# Patient Record
Sex: Male | Born: 1935 | Race: White | Hispanic: No | Marital: Married | Smoking: Former smoker
Health system: Southern US, Community
[De-identification: ages and names within clinical notes are randomized; demographics above are authoritative.]

## PROBLEM LIST (undated history)

## (undated) DIAGNOSIS — I10 Essential (primary) hypertension: Secondary | ICD-10-CM

## (undated) DIAGNOSIS — C4490 Unspecified malignant neoplasm of skin, unspecified: Secondary | ICD-10-CM

## (undated) DIAGNOSIS — A419 Sepsis, unspecified organism: Secondary | ICD-10-CM

## (undated) DIAGNOSIS — Z87898 Personal history of other specified conditions: Secondary | ICD-10-CM

## (undated) DIAGNOSIS — B029 Zoster without complications: Secondary | ICD-10-CM

## (undated) DIAGNOSIS — I519 Heart disease, unspecified: Secondary | ICD-10-CM

## (undated) DIAGNOSIS — K222 Esophageal obstruction: Secondary | ICD-10-CM

## (undated) DIAGNOSIS — R7989 Other specified abnormal findings of blood chemistry: Secondary | ICD-10-CM

## (undated) DIAGNOSIS — M810 Age-related osteoporosis without current pathological fracture: Secondary | ICD-10-CM

## (undated) DIAGNOSIS — C61 Malignant neoplasm of prostate: Secondary | ICD-10-CM

## (undated) DIAGNOSIS — Z8669 Personal history of other diseases of the nervous system and sense organs: Secondary | ICD-10-CM

## (undated) DIAGNOSIS — E7800 Pure hypercholesterolemia, unspecified: Secondary | ICD-10-CM

## (undated) DIAGNOSIS — I509 Heart failure, unspecified: Secondary | ICD-10-CM

## (undated) DIAGNOSIS — K8050 Calculus of bile duct without cholangitis or cholecystitis without obstruction: Secondary | ICD-10-CM

## (undated) DIAGNOSIS — Z8719 Personal history of other diseases of the digestive system: Secondary | ICD-10-CM

## (undated) DIAGNOSIS — E46 Unspecified protein-calorie malnutrition: Secondary | ICD-10-CM

## (undated) DIAGNOSIS — N184 Chronic kidney disease, stage 4 (severe): Secondary | ICD-10-CM

## (undated) DIAGNOSIS — D649 Anemia, unspecified: Secondary | ICD-10-CM

## (undated) DIAGNOSIS — N19 Unspecified kidney failure: Secondary | ICD-10-CM

## (undated) DIAGNOSIS — E039 Hypothyroidism, unspecified: Secondary | ICD-10-CM

## (undated) HISTORY — DX: Personal history of other specified conditions: Z87.898

## (undated) HISTORY — DX: Calculus of bile duct without cholangitis or cholecystitis without obstruction: K80.50

## (undated) HISTORY — DX: Heart disease, unspecified: I51.9

## (undated) HISTORY — DX: Unspecified kidney failure: N19

## (undated) HISTORY — DX: Unspecified malignant neoplasm of skin, unspecified: C44.90

## (undated) HISTORY — DX: Hypothyroidism, unspecified: E03.9

## (undated) HISTORY — DX: Zoster without complications: B02.9

## (undated) HISTORY — DX: Other specified abnormal findings of blood chemistry: R79.89

## (undated) HISTORY — DX: Anemia, unspecified: D64.9

## (undated) HISTORY — DX: Esophageal obstruction: K22.2

## (undated) HISTORY — DX: Sepsis, unspecified organism: A41.9

## (undated) HISTORY — DX: Chronic kidney disease, stage 4 (severe): N18.4

## (undated) HISTORY — DX: Pure hypercholesterolemia, unspecified: E78.00

## (undated) HISTORY — DX: Malignant neoplasm of prostate: C61

## (undated) HISTORY — DX: Personal history of other diseases of the nervous system and sense organs: Z86.69

## (undated) HISTORY — DX: Personal history of other diseases of the digestive system: Z87.19

## (undated) HISTORY — DX: Unspecified protein-calorie malnutrition: E46

---

## 2004-04-09 ENCOUNTER — Other Ambulatory Visit: Payer: Self-pay

## 2004-04-09 ENCOUNTER — Emergency Department: Payer: Self-pay | Admitting: Emergency Medicine

## 2004-05-16 ENCOUNTER — Ambulatory Visit: Payer: Self-pay | Admitting: Internal Medicine

## 2004-07-17 ENCOUNTER — Ambulatory Visit: Payer: Self-pay | Admitting: Urology

## 2004-08-04 ENCOUNTER — Ambulatory Visit: Payer: Self-pay | Admitting: Cardiovascular Disease

## 2004-08-09 ENCOUNTER — Ambulatory Visit: Payer: Self-pay | Admitting: Cardiovascular Disease

## 2004-08-09 ENCOUNTER — Inpatient Hospital Stay (HOSPITAL_COMMUNITY): Admission: EM | Admit: 2004-08-09 | Discharge: 2004-08-10 | Payer: Self-pay | Admitting: Cardiology

## 2004-10-11 ENCOUNTER — Ambulatory Visit: Payer: Self-pay | Admitting: Radiation Oncology

## 2004-11-06 ENCOUNTER — Ambulatory Visit: Payer: Self-pay | Admitting: Radiation Oncology

## 2004-12-07 ENCOUNTER — Ambulatory Visit: Payer: Self-pay | Admitting: Radiation Oncology

## 2005-01-06 ENCOUNTER — Ambulatory Visit: Payer: Self-pay | Admitting: Radiation Oncology

## 2005-06-04 ENCOUNTER — Ambulatory Visit: Payer: Self-pay | Admitting: Radiation Oncology

## 2005-06-08 ENCOUNTER — Ambulatory Visit: Payer: Self-pay | Admitting: Radiation Oncology

## 2005-07-31 ENCOUNTER — Ambulatory Visit: Payer: Self-pay | Admitting: Urology

## 2005-07-31 ENCOUNTER — Other Ambulatory Visit: Payer: Self-pay

## 2005-08-02 ENCOUNTER — Ambulatory Visit: Payer: Self-pay | Admitting: Urology

## 2005-08-20 ENCOUNTER — Ambulatory Visit: Payer: Self-pay | Admitting: Urology

## 2005-08-23 ENCOUNTER — Ambulatory Visit: Payer: Self-pay | Admitting: Urology

## 2005-10-30 ENCOUNTER — Ambulatory Visit: Payer: Self-pay | Admitting: Urology

## 2005-10-30 ENCOUNTER — Other Ambulatory Visit: Payer: Self-pay

## 2005-11-01 ENCOUNTER — Ambulatory Visit: Payer: Self-pay | Admitting: Urology

## 2005-12-06 ENCOUNTER — Ambulatory Visit: Payer: Self-pay | Admitting: Radiation Oncology

## 2006-01-15 ENCOUNTER — Ambulatory Visit: Payer: Self-pay | Admitting: Urology

## 2006-01-15 ENCOUNTER — Other Ambulatory Visit: Payer: Self-pay

## 2006-01-31 ENCOUNTER — Ambulatory Visit: Payer: Self-pay | Admitting: Urology

## 2006-04-23 ENCOUNTER — Ambulatory Visit: Payer: Self-pay | Admitting: Urology

## 2006-04-30 ENCOUNTER — Ambulatory Visit: Payer: Self-pay | Admitting: Urology

## 2006-07-19 ENCOUNTER — Ambulatory Visit: Payer: Self-pay | Admitting: Urology

## 2006-12-05 ENCOUNTER — Ambulatory Visit: Payer: Self-pay | Admitting: Radiation Oncology

## 2007-06-26 ENCOUNTER — Ambulatory Visit: Payer: Self-pay | Admitting: Specialist

## 2007-11-07 ENCOUNTER — Ambulatory Visit: Payer: Self-pay | Admitting: Radiation Oncology

## 2007-12-04 ENCOUNTER — Ambulatory Visit: Payer: Self-pay | Admitting: Radiation Oncology

## 2007-12-08 ENCOUNTER — Ambulatory Visit: Payer: Self-pay | Admitting: Radiation Oncology

## 2008-11-06 ENCOUNTER — Ambulatory Visit: Payer: Self-pay | Admitting: Radiation Oncology

## 2008-12-02 ENCOUNTER — Ambulatory Visit: Payer: Self-pay | Admitting: Radiation Oncology

## 2008-12-07 ENCOUNTER — Ambulatory Visit: Payer: Self-pay | Admitting: Radiation Oncology

## 2009-11-06 ENCOUNTER — Ambulatory Visit: Payer: Self-pay | Admitting: Radiation Oncology

## 2009-12-01 ENCOUNTER — Ambulatory Visit: Payer: Self-pay | Admitting: Radiation Oncology

## 2009-12-07 ENCOUNTER — Ambulatory Visit: Payer: Self-pay | Admitting: Radiation Oncology

## 2010-06-05 ENCOUNTER — Ambulatory Visit: Payer: Self-pay | Admitting: Radiation Oncology

## 2010-06-06 LAB — PSA

## 2010-06-08 ENCOUNTER — Ambulatory Visit: Payer: Self-pay | Admitting: Radiation Oncology

## 2010-11-14 ENCOUNTER — Ambulatory Visit: Payer: Self-pay | Admitting: Radiation Oncology

## 2010-12-01 ENCOUNTER — Emergency Department (HOSPITAL_COMMUNITY): Payer: Medicare Other

## 2010-12-01 ENCOUNTER — Inpatient Hospital Stay (HOSPITAL_COMMUNITY)
Admission: EM | Admit: 2010-12-01 | Discharge: 2010-12-03 | DRG: 312 | Disposition: A | Discharge status: Home / Self Care | Payer: Medicare Other | Attending: Emergency Medicine | Admitting: Emergency Medicine

## 2010-12-01 DIAGNOSIS — I959 Hypotension, unspecified: Secondary | ICD-10-CM | POA: Diagnosis present

## 2010-12-01 DIAGNOSIS — N269 Renal sclerosis, unspecified: Secondary | ICD-10-CM | POA: Diagnosis present

## 2010-12-01 DIAGNOSIS — N189 Chronic kidney disease, unspecified: Secondary | ICD-10-CM | POA: Diagnosis present

## 2010-12-01 DIAGNOSIS — I2510 Atherosclerotic heart disease of native coronary artery without angina pectoris: Secondary | ICD-10-CM | POA: Diagnosis present

## 2010-12-01 DIAGNOSIS — E785 Hyperlipidemia, unspecified: Secondary | ICD-10-CM | POA: Diagnosis present

## 2010-12-01 DIAGNOSIS — E039 Hypothyroidism, unspecified: Secondary | ICD-10-CM | POA: Diagnosis present

## 2010-12-01 DIAGNOSIS — R55 Syncope and collapse: Principal | ICD-10-CM | POA: Diagnosis present

## 2010-12-01 DIAGNOSIS — Z7982 Long term (current) use of aspirin: Secondary | ICD-10-CM

## 2010-12-01 DIAGNOSIS — Z9861 Coronary angioplasty status: Secondary | ICD-10-CM

## 2010-12-01 DIAGNOSIS — I129 Hypertensive chronic kidney disease with stage 1 through stage 4 chronic kidney disease, or unspecified chronic kidney disease: Secondary | ICD-10-CM | POA: Diagnosis present

## 2010-12-01 DIAGNOSIS — C61 Malignant neoplasm of prostate: Secondary | ICD-10-CM | POA: Diagnosis present

## 2010-12-01 DIAGNOSIS — E860 Dehydration: Secondary | ICD-10-CM | POA: Diagnosis present

## 2010-12-01 DIAGNOSIS — Z7902 Long term (current) use of antithrombotics/antiplatelets: Secondary | ICD-10-CM

## 2010-12-01 LAB — PROTIME-INR
INR: 1.01 (ref 0.00–1.49)
Prothrombin Time: 13.5 seconds (ref 11.6–15.2)

## 2010-12-01 LAB — DIFFERENTIAL
Basophils Absolute: 0 10*3/uL (ref 0.0–0.1)
Basophils Relative: 0 % (ref 0–1)
Monocytes Relative: 6 % (ref 3–12)
Neutro Abs: 7.9 10*3/uL — ABNORMAL HIGH (ref 1.7–7.7)
Neutrophils Relative %: 85 % — ABNORMAL HIGH (ref 43–77)

## 2010-12-01 LAB — BASIC METABOLIC PANEL
BUN: 45 mg/dL — ABNORMAL HIGH (ref 6–23)
CO2: 22 mEq/L (ref 19–32)
Chloride: 100 mEq/L (ref 96–112)
Creatinine, Ser: 2.68 mg/dL — ABNORMAL HIGH (ref 0.4–1.5)
Potassium: 3.9 mEq/L (ref 3.5–5.1)

## 2010-12-01 LAB — CK TOTAL AND CKMB (NOT AT ARMC)
Relative Index: INVALID (ref 0.0–2.5)
Total CK: 99 U/L (ref 7–232)

## 2010-12-01 LAB — CBC
Hemoglobin: 13.8 g/dL (ref 13.0–17.0)
Platelets: 153 10*3/uL (ref 150–400)
RBC: 4.66 MIL/uL (ref 4.22–5.81)
WBC: 9.3 10*3/uL (ref 4.0–10.5)

## 2010-12-01 LAB — APTT: aPTT: 25 seconds (ref 24–37)

## 2010-12-02 ENCOUNTER — Inpatient Hospital Stay (HOSPITAL_COMMUNITY): Payer: Medicare Other

## 2010-12-02 ENCOUNTER — Other Ambulatory Visit: Payer: Self-pay | Admitting: Internal Medicine

## 2010-12-02 DIAGNOSIS — R55 Syncope and collapse: Secondary | ICD-10-CM

## 2010-12-02 LAB — COMPREHENSIVE METABOLIC PANEL
AST: 13 U/L (ref 0–37)
Albumin: 3 g/dL — ABNORMAL LOW (ref 3.5–5.2)
BUN: 43 mg/dL — ABNORMAL HIGH (ref 6–23)
Chloride: 106 mEq/L (ref 96–112)
Creatinine, Ser: 2.46 mg/dL — ABNORMAL HIGH (ref 0.4–1.5)
GFR calc Af Amer: 31 mL/min — ABNORMAL LOW (ref >60)
Total Protein: 5.7 g/dL — ABNORMAL LOW (ref 6.0–8.3)

## 2010-12-02 LAB — CBC
HCT: 35.9 % — ABNORMAL LOW (ref 39.0–52.0)
MCH: 29.9 pg (ref 26.0–34.0)
MCV: 85.9 fL (ref 78.0–100.0)
Platelets: 143 10*3/uL — ABNORMAL LOW (ref 150–400)
RDW: 13.7 % (ref 11.5–15.5)

## 2010-12-02 LAB — LIPID PANEL
Cholesterol: 122 mg/dL (ref 0–200)
HDL: 42 mg/dL (ref >39)
LDL Cholesterol: 51 mg/dL (ref 0–99)
Total CHOL/HDL Ratio: 2.9 RATIO
Triglycerides: 146 mg/dL (ref <150)
VLDL: 29 mg/dL (ref 0–40)

## 2010-12-02 LAB — CREATININE, URINE, RANDOM: Creatinine, Urine: 50.29 mg/dL

## 2010-12-02 LAB — URINALYSIS, ROUTINE W REFLEX MICROSCOPIC
Bilirubin Urine: NEGATIVE
Hgb urine dipstick: NEGATIVE
Ketones, ur: NEGATIVE mg/dL
Nitrite: NEGATIVE
Specific Gravity, Urine: 1.012 (ref 1.005–1.030)
Urobilinogen, UA: 0.2 mg/dL (ref 0.0–1.0)
pH: 5.5 (ref 5.0–8.0)

## 2010-12-02 LAB — CARDIAC PANEL(CRET KIN+CKTOT+MB+TROPI)
CK, MB: 2.4 ng/mL (ref 0.3–4.0)
CK, MB: 2.7 ng/mL (ref 0.3–4.0)
Total CK: 101 U/L (ref 7–232)

## 2010-12-02 LAB — SODIUM, URINE, RANDOM: Sodium, Ur: 35 mEq/L

## 2010-12-03 DIAGNOSIS — R55 Syncope and collapse: Secondary | ICD-10-CM

## 2010-12-03 LAB — CBC
HCT: 36 % — ABNORMAL LOW (ref 39.0–52.0)
Platelets: 149 10*3/uL — ABNORMAL LOW (ref 150–400)
RDW: 13.8 % (ref 11.5–15.5)
WBC: 5.2 10*3/uL (ref 4.0–10.5)

## 2010-12-03 LAB — DIFFERENTIAL
Basophils Absolute: 0 10*3/uL (ref 0.0–0.1)
Eosinophils Relative: 4 % (ref 0–5)
Lymphocytes Relative: 20 % (ref 12–46)
Neutrophils Relative %: 65 % (ref 43–77)

## 2010-12-03 LAB — COMPREHENSIVE METABOLIC PANEL
ALT: 16 U/L (ref 0–53)
AST: 18 U/L (ref 0–37)
Calcium: 8.3 mg/dL — ABNORMAL LOW (ref 8.4–10.5)
GFR calc Af Amer: 37 mL/min — ABNORMAL LOW (ref >60)
Sodium: 141 mEq/L (ref 135–145)
Total Protein: 5.7 g/dL — ABNORMAL LOW (ref 6.0–8.3)

## 2010-12-08 ENCOUNTER — Ambulatory Visit: Payer: Self-pay | Admitting: Radiation Oncology

## 2010-12-08 NOTE — H&P (Signed)
NAME:  [name], MANCILLA NO.:  [id number]  MEDICAL RECORD NO.:  [id number]           PATIENT TYPE:  E  LOCATION:  [facility]                         FACILITY:  [facility]  PHYSICIAN:  Rise Patience, MDDATE OF BIRTH:  08-17-1926  DATE OF ADMISSION:  12/01/2010 DATE OF DISCHARGE:                             HISTORY & PHYSICAL   PRIMARY CARE PHYSICIAN:  Dr. Brunetta Genera at [facility].  CHIEF COMPLAINT:  Fainting spell.  HISTORY OF PRESENT ILLNESS:  An 77 year old male with known history of CAD, status post stenting, hypertension, hypothyroidism, hyperlipidemia, history of CA prostate with recurrent, was doing fine around 4 o'clock in the evening, the patient suddenly got headache in the occipital area, and things around blurred and then felt dizzy.  He was in his yard with his lawnmower at that time, trying to change the oil, he walked back to his house, and his wife was in the house.  The patient sat down and he had diaphoresis.  The patient did not have any chest pain or shortness of breath.  No nausea, vomiting, abdominal pain, dysuria, discharge, diarrhea.  He did not have any focal deficit.  Did not have any difficulty swallowing or speaking.  His symptoms of headache lasted until the patient came to the ER, but his blurred vision completely resolved at home itself.  The patient was found to have increased creatinine at this time, is not orthostatic, has been admitted for further workup.  PAST HISTORY:  History of CAD status post stenting, hypertension, hyperlipidemia, hypothyroidism, history of CA prostate with recurrent.  PAST SURGICAL HISTORY:  Had skin cancer removed from his face 2 weeks ago.  MEDICATIONS PRIOR TO ADMISSION:  Has to be verified includes: 1. Benicar 40/12.5 p.o. daily. 2. Ferrous sulfate. 3. Klor-Con 10 mg daily. 4. Lasix 20 mg daily. 5. Levoxyl 50 mcg p.o. daily. 6. Lovaza 1000 mg twice daily. 7. Potassium chloride. 8. Protonix. 9.  Vitamin D3. 10.Flomax 0.4 mg daily. 11.Plavix 75 daily. 12.Caduet 10/40 daily. 13.Aspirin 81 daily. 14.Nitroglycerin 0.4 mg sublingual p.r.n. for chest pain.  ALLERGIES:  PENICILLIN.  FAMILY HISTORY:  Positive for coronary disease.  SOCIAL HISTORY:  The patient quit smoking many years ago.  Denies any alcohol or drug abuse.  He is married, lives with his wife.  He is a full code.  REVIEW OF SYSTEMS:  As per in the history of presenting illness.  In addition, the patient's occipital headache has resolved at this time. He said he did get some temporal headache after that which also has improved at this time.  PHYSICAL EXAMINATION:  GENERAL:  The patient was examined at bedside, not in acute distress. VITAL SIGNS:  Blood pressure 120/67, pulse 78 per minute, temperature 98.7, respiration 18 per minute, O2 sat 98%. HEENT:  Anicteric.  No pallor.  No facial asymmetry.  Tongue is midline. PERLA positive.  The patient is able to see in both eyes.  No neck rigidity. CHEST:  Bilateral air entry present.  No rhonchi.  No crepitation. HEART:  S1 and S2 heard. ABDOMEN:  Soft, nontender.  Bowel sounds heard. CNS:  The  patient is alert, awake, and oriented to time, place, and person.  Moves upper and lower extremities, 5/5.  There is no pronator drift.  No dysdiadochokinesia or ataxia. EXTREMITIES:  Peripheral pulses felt.  No edema.  LABS:  EKG shows normal sinus rhythm with RBBB, heart rate is around 57 beats per minute with nonspecific ST-T changes.  I have ordered a CT of the head without contrast.  CBC:  WBC 9.3, hemoglobin 13.8, hematocrit is 40.2, platelets are 53, neutrophils 85%.  PT/INR is 13.5 and 1. Basic metabolic panel:  Sodium [id number], potassium 3.9, chloride 100, carbon dioxide 22, glucose 126, anion gap is 13, BUN 45, creatinine 2.6, calcium 9, CK is 99, CK-MB is 2.7, troponin less than 0.3, BNP 67.1.  ASSESSMENT: 1. Near syncope. 2. Renal failure probably acute. 3.  Headache, which is resolved at this time. 4. History of coronary artery disease status post stenting. 5. Hypertension. 6. History of hyperlipidemia. 7. History of hypothyroidism. 8. History of cancer prostate, recurrent at this time.  PLAN: 1. At this time, we will admit the patient to telemetry to rule out     any arrhythmia. 2. For his near syncopal episode, I am going to get a CT head as the     patient had initially some of the headache.  We will also get a sed     rate and then we will get an MRI of the brain in the morning.  We     will get 2-D echo.  Cycle cardiac markers. 3. For his renal failure, we will get a UA at this time.  We will also     look for any cast and also look for any possible UTI.  We will keep     the patient on strict intake output.  I am going to hydrate the     patient.  We will hold off his Lasix.  We will also hold off his     Benicar and HCTZ at this time.  If his creatinine does not improve,     we will need a renal sonogram. 4. For his hypertension, at this time, I will keep the patient on     p.r.n. hydralazine and we will continue his Norvasc. 5. Further recommendation based on test order and clinical course.     Rise Patience, MD     ANK/MEDQ  D:  12/01/2010  T:  12/01/2010  Job:  [id number]  cc:   Lorelee Market  Electronically Signed by Gean Birchwood MD on 12/08/2010 07:41:39 AM

## 2011-02-05 NOTE — Discharge Summary (Signed)
[name], [name]                 ACCOUNT NO.:  [id number]  MEDICAL RECORD NO.:  [id number]           PATIENT TYPE:  I  LOCATION:  2005                         FACILITY:  [facility]  PHYSICIAN:  Christe Tellez I Sharilynn Cassity, MD      DATE OF BIRTH:  August 24, 1926  DATE OF ADMISSION:  12/01/2010 DATE OF DISCHARGE:  12/03/2010                              DISCHARGE SUMMARY   PRIMARY CARE PHYSICIAN:  Dr. Brunetta Genera at [facility].  CARDIOLOGIST:  Dr. Humphrey Rolls at [facility] in [place].  DISCHARGE DIAGNOSES: 1. Syncopal episode felt to be secondary to hypertension. 2. Hypertension, felt to be secondary to medications. 3. Acute-on-chronic renal insufficiency, improving, baseline and known     to me. 4. Coronary artery disease, status post stenting. 5. Hypothyroidism. 6. Hyperlipidemia. 7. History of left kidney atrophy. 8. History of prostate cancer, which is recurrent.  CURRENT DISCHARGE MEDICATIONS: 1. Aspirin 81 mg daily. 2. Caduet 10/40 half tab daily. 3. Ferrous sulfate 325 twice daily. 4. K-Dur 10 mEq p.o. daily. 5. Levothyroxine 50 mcg p.o. daily. 6. Lovaza. 7. Nitroglycerin 0.4. 8. Protonix 40 mg twice daily. 9. Plavix 75 mg daily. 10.Flomax 0.4 mg daily. 11.Vitamin D3.  MEDICATIONS ON HOLD:  Benicar 40/12.5 and Lasix 20 mg daily.  PROCEDURES: 1. A 2-D echo, EF 50-60.  Wall motion was normal.  There were no     residual wall motion abnormalities.  Cavity size mildly dilated,     wall thickness within normal.  No defect or patent foramen ovale     was identified.  Aortic valve minimally calcified.  Leaflet     mobility was not restricted.  Aortic root was normal in size. 2. Ultrasound of the kidney, left renal atrophy and hydronephrosis.     Recommend evaluation with nuclear medicine renal scan to assess for     any residual left renal function and per patient discussion this is     his normal baseline and he is being followed up by nephrologists     and  urologists. 3. MRI of the brain.  No acute infarct.  Moderate small vessel     disease. 4. MRI of the cervical spine; 8 mm sclerotic focus, right aspect of C2     at the base of the dens without other sclerotic foci to suggest     metastatic disease, C5-C6 with small broad-based disk complex with     mild spinal stenosis.  Minimal cord effacement.  Mild bilateral     foraminal narrowing. 5. CT head without contrast; no acute intracranial hemorrhage or CT     findings __________.  HISTORY OF PRESENT ILLNESS:  This is an 77 year old male with a history of coronary artery disease status post stent followed up at [facility] with Dr. Humphrey Rolls in [place], hypertension, hypothyroidism, presented with an episode of syncope lasted for a short second.  Per EMS, the patient was hypotensive, blood pressure was 84/64 with pulse rate 16, heart rate 64, oxygen saturation not reported and glucose 116. The patient was awake, alert, oriented when seen by EMS, had cool and sweaty skin.  He was complaining of pain at the rear of his head prior to feeling lightheaded.  The patient was transferred to the ED and admitted to [facility] for further management. 1. Syncopal episode.  The patient admitted to Telemetry.  Cardiac     monitors negative for any fatal arrhythmia.  Cardiac enzymes were     cycled which were negative.  The patient noted to be hypotensive by     EMS and noted the elevated renal function which felt could be the     cause of the patient's syncopal episode.  Accordingly, medication     kept on hold, mainly Benicar, Lasix, and the patient was advised to     hold Lasix for at least 3 days and check with his MD to repeat BUN     and creatinine and further adjustment of his blood pressure     medication. 2. Acute-on-chronic renal insufficiency.  The patient informed me he     had left kidney atrophy and this has matched the ultrasound of the     kidney.  His creatinine today  improved to 2.09 and BUN of 30 and     this is likely the cause of the patient's syncopal episode due to     hypotension and acute-on-chronic renal insufficiency and     dehydration. 3. Coronary artery disease.  The patient will continue his aspirin and     Plavix and he needs to follow up with Dr. Humphrey Rolls as an outpatient. 4. The MRI of the C spine did show a small sclerotic foci, not clear     if this is metastatic from the prostate and the patient admitted he     had recurrence of his prostate cancer.  I suggest to follow up with     his MD as well as with his oncologist.  I will contact the     patient's MD by Tuesday as we have [facility].     Kirk Basey Franco Collet, MD     HIE/MEDQ  D:  12/03/2010  T:  12/04/2010  Job:  [id number]  cc:   Dr. Humphrey Rolls Dr. Brunetta Genera  Electronically Signed by Donia Ast MD on 02/05/2011 02:33:52 PM

## 2011-05-17 ENCOUNTER — Ambulatory Visit: Payer: Self-pay | Admitting: Radiation Oncology

## 2011-06-09 ENCOUNTER — Ambulatory Visit: Payer: Self-pay | Admitting: Radiation Oncology

## 2011-11-15 ENCOUNTER — Ambulatory Visit: Payer: Self-pay | Admitting: Radiation Oncology

## 2011-12-08 ENCOUNTER — Ambulatory Visit: Payer: Self-pay | Admitting: Radiation Oncology

## 2012-03-05 ENCOUNTER — Inpatient Hospital Stay: Payer: Self-pay | Admitting: Internal Medicine

## 2012-03-05 LAB — COMPREHENSIVE METABOLIC PANEL
Albumin: 3.5 g/dL (ref 3.4–5.0)
Anion Gap: 8 (ref 7–16)
BUN: 23 mg/dL — ABNORMAL HIGH (ref 7–18)
Bilirubin,Total: 1 mg/dL (ref 0.2–1.0)
Creatinine: 2.13 mg/dL — ABNORMAL HIGH (ref 0.60–1.30)
Glucose: 195 mg/dL — ABNORMAL HIGH (ref 65–99)
Osmolality: 285 (ref 275–301)
Potassium: 1.9 mmol/L — CL (ref 3.5–5.1)
SGOT(AST): 33 U/L (ref 15–37)
Sodium: 138 mmol/L (ref 136–145)
Total Protein: 7 g/dL (ref 6.4–8.2)

## 2012-03-05 LAB — CBC WITH DIFFERENTIAL/PLATELET
Basophil %: 0.4 %
Eosinophil %: 1.1 %
HGB: 15.1 g/dL (ref 13.0–18.0)
Lymphocyte #: 0.9 10*3/uL — ABNORMAL LOW (ref 1.0–3.6)
MCH: 28.9 pg (ref 26.0–34.0)
MCV: 84 fL (ref 80–100)
Monocyte #: 0.6 x10 3/mm (ref 0.2–1.0)
RBC: 5.23 10*6/uL (ref 4.40–5.90)

## 2012-03-05 LAB — MAGNESIUM: Magnesium: 1.9 mg/dL

## 2012-03-06 LAB — POTASSIUM: Potassium: 2.9 mmol/L — ABNORMAL LOW (ref 3.5–5.1)

## 2012-03-06 LAB — BASIC METABOLIC PANEL
Anion Gap: 9 (ref 7–16)
Calcium, Total: 8.4 mg/dL — ABNORMAL LOW (ref 8.5–10.1)
Chloride: 101 mmol/L (ref 98–107)
Co2: 32 mmol/L (ref 21–32)
Creatinine: 1.97 mg/dL — ABNORMAL HIGH (ref 0.60–1.30)
EGFR (African American): 36 — ABNORMAL LOW
Osmolality: 288 (ref 275–301)

## 2012-03-06 LAB — MAGNESIUM: Magnesium: 1.9 mg/dL

## 2012-03-07 LAB — BASIC METABOLIC PANEL
Anion Gap: 10 (ref 7–16)
Calcium, Total: 8.2 mg/dL — ABNORMAL LOW (ref 8.5–10.1)
Creatinine: 1.81 mg/dL — ABNORMAL HIGH (ref 0.60–1.30)
EGFR (African American): 39 — ABNORMAL LOW
EGFR (Non-African Amer.): 34 — ABNORMAL LOW
Osmolality: 285 (ref 275–301)
Potassium: 2.9 mmol/L — ABNORMAL LOW (ref 3.5–5.1)

## 2012-03-07 LAB — POTASSIUM: Potassium: 3.4 mmol/L — ABNORMAL LOW

## 2012-05-22 ENCOUNTER — Ambulatory Visit: Payer: Self-pay | Admitting: Radiation Oncology

## 2012-05-25 DIAGNOSIS — N200 Calculus of kidney: Secondary | ICD-10-CM | POA: Insufficient documentation

## 2012-05-25 DIAGNOSIS — N1330 Unspecified hydronephrosis: Secondary | ICD-10-CM | POA: Insufficient documentation

## 2012-05-25 DIAGNOSIS — N201 Calculus of ureter: Secondary | ICD-10-CM | POA: Insufficient documentation

## 2012-05-25 DIAGNOSIS — N184 Chronic kidney disease, stage 4 (severe): Secondary | ICD-10-CM | POA: Insufficient documentation

## 2012-05-28 ENCOUNTER — Ambulatory Visit: Payer: Self-pay | Admitting: Urology

## 2012-06-11 ENCOUNTER — Ambulatory Visit: Payer: Self-pay | Admitting: Urology

## 2012-06-12 ENCOUNTER — Ambulatory Visit: Payer: Self-pay | Admitting: Radiation Oncology

## 2012-09-09 ENCOUNTER — Ambulatory Visit: Payer: Self-pay | Admitting: Emergency Medicine

## 2013-05-29 ENCOUNTER — Ambulatory Visit: Payer: Self-pay | Admitting: Radiation Oncology

## 2013-06-08 ENCOUNTER — Ambulatory Visit: Payer: Self-pay | Admitting: Radiation Oncology

## 2013-11-20 ENCOUNTER — Ambulatory Visit: Payer: Self-pay | Admitting: Radiation Oncology

## 2013-11-20 LAB — PSA: PSA: 5.5

## 2013-11-23 LAB — PSA: PSA: 5.5 ng/mL — AB (ref 0.0–4.0)

## 2013-12-07 ENCOUNTER — Ambulatory Visit: Payer: Self-pay | Admitting: Radiation Oncology

## 2014-04-13 ENCOUNTER — Ambulatory Visit: Payer: Self-pay | Admitting: Cardiovascular Disease

## 2014-04-13 HISTORY — PX: CARDIAC CATHETERIZATION: SHX172

## 2014-05-11 ENCOUNTER — Ambulatory Visit: Payer: Self-pay | Admitting: Radiation Oncology

## 2014-05-12 LAB — PSA: PSA: 0.4

## 2014-05-13 LAB — PSA: PSA: 0.4 ng/mL (ref 0.0–4.0)

## 2014-06-08 ENCOUNTER — Ambulatory Visit: Payer: Self-pay | Admitting: Radiation Oncology

## 2014-10-26 NOTE — Discharge Summary (Signed)
PATIENT NAME:  [name], [name] MR#:  [id number] DATE OF BIRTH:  [birth year]  DATE OF ADMISSION:  03/05/2012 DATE OF DISCHARGE:  03/07/2012  PRESENTING COMPLAINT: Abnormal labs.   DISCHARGE DIAGNOSES:  1. Hypokalemia suspected from diuretics.  2. Accelerated hypertension.  3. Chronic kidney disease stage III.   CONDITION ON DISCHARGE: Fair.   MEDICATIONS:  1. Lovaza 1000 mg b.i.d.  2. Protonix 40 mg b.i.d.  3. Plavix 75 mg daily.  4. Ferrous sulfate 325 mg, 1 tablet 2 times a day.  5. Levoxyl 150 mcg, 1/2 tablet p.o. daily.  6. Atorvastatin 40 mg, 1 tablet at bedtime.  7. Aspirin 81 mg daily.  8. Nitroglycerin 0.4 mg sublingual, 1 tablet as needed.  9. Vitamin D3 1000 international units 3 times a day.  10. Norvasc 5 mg daily.  11. Losartan 100 mg daily.  12. K-Dur 10 mEq p.o. b.i.d. for 10 days.  13. Spironolactone 25 mg p.o. daily.  The patient was advised not to take hydrochlorothiazide.   FOLLOWUP:  1. Follow up with Dr. Neoma Laming in 1 to 2 weeks.  2. Follow up with Dr. Juleen China at [place] 03/20/2012 at 11:00 a.m.  3. Follow up with Dr. Lucky Cowboy on 03/20/2012 at 2:30 p.m.   LABS AT DISCHARGE:  Potassium 3.4, creatinine 1.81, glucose 128, BUN 14, sodium? 142, chloride 105. Serum renin is 0.45 ng per ml per hour. Serum aldosterone pending. Magnesium 1.9.  EKG showed normal sinus rhythm with right bundle branch. CBC within normal limits.   CONSULTATION: Dr. Neoma Laming, cardiology.   BRIEF SUMMARY OF HOSPITAL COURSE: [name] is an 81 year old Caucasian gentleman with past medical history of hypertension who was sent from Dr. April Manson office with hypokalemia. Potassium was 1.9. He was admitted with:  1. Hypokalemia, which was suspected most likely from hydrochlorothiazide that the patient had been taking.  HCTZ was discontinued. The patient was monitored on continuous telemetry.  Did not show any arrhythmia or cardiac event. The patient did have a  couple of nonsustained V. tach. The patient was asymptomatic. He was given potassium orally and IV. Magnesium was normal. With low potassium and elevated hypertension, renin and aldosterone levels were sent out. I  curbsided nephrology who recommended starting spironolactone. Given the patient's chronic kidney disease and elevated high blood pressure, the patient was considered for renal artery duplex ultrasound, for which the patient will follow up with Dr. Lucky Cowboy as an outpatient.  2. Chronic renal failure, creatinine baseline around 1.8. The patient has a history of left-sided renal stone and is status post lithotripsy with permanently damaged left kidney. The patient does have some borderline chronic kidney disease. IV fluids were continued along with potassium replacement. Creatinine at discharge was 1.81. 3. Coronary artery disease. The patient underwent outpatient stress test with Dr. Neoma Laming. His  stress test was unremarkable per Dr. Humphrey Rolls. Aspirin, Plavix, losartan, and atorvastatin were continued.  He is not on beta blockers, probably because of his right bundle branch block.  4. Hypertension. Losartan was continued. Norvasc and spironolactone were added. Hydrochlorothiazide was discontinued due to hypokalemia.  5. Hypothyroidism. The patient is on levothyroxine.  6. History of prostate cancer status post radiation.  7. Deep vein thrombosis prophylaxis with subcutaneous heparin.  8. Hospital stay otherwise was stable.  9. CODE STATUS: The patient remained a FULL CODE.      TIME SPENT: 40 minutes.   ____________________________ Hart Rochester Posey Pronto, MD sap:bjt D: 03/09/2012 [id number] ET T: 03/11/2012  11:36:34 ET JOB#: [id number]  cc: Charvez Voorhies A. Posey Pronto, MD, <Dictator> Algernon Huxley, MD Dionisio David, MD Mamie Levers, MD Meindert A. Brunetta Genera, MD Ilda Basset MD ELECTRONICALLY SIGNED 03/11/2012 15:45

## 2014-10-26 NOTE — Consult Note (Signed)
Patient was seen in office on 03/04/12 for f/u of stress myoview which was normal with normal LVEF. Labs were done and has very low pottasium, thus sent for replacement. Seems to be doing ok, and k is 2.9 today. Advise continue replacement IV, and PO to get level above 3.7.  Electronic Signatures: Angelica Ran (MD)  (Signed on 29-Aug-13 16:41)  Authored  Last Updated: 29-Aug-13 16:41 by Angelica Ran (MD)

## 2014-10-26 NOTE — H&P (Signed)
PATIENT NAME:  [name], [name] MR#:  [id number] DATE OF BIRTH:  06-Apr-1927  DATE OF ADMISSION:  03/05/2012  PRIMARY CARE PHYSICIAN: Lorelee Market, MD   CARDIOLOGIST: Neoma Laming, MD    PRESENTING COMPLAINT: My doctor's office called me and tells "potassium is very low, go to the Emergency Room."  HISTORY OF PRESENT ILLNESS: The patient is an 81 year old male with a past medical history of coronary artery disease, renal stone, and got renal failure.  He is following with Dr. Humphrey Rolls, Cardiology. Before two days, he went to his doctor's office for his routine stress test and blood work. Yesterday, he received a call from the doctor's office that his stress test was normal. Today early morning, he received a call from the doctor's office that blood results came back and potassium level is very low, so he had to go to the Emergency Room immediately, and he came to the Emergency Room.  On further questioning, he denies any complaint of feeling dizzy, palpitations, chest pain, shortness of breath, leg swelling, cough, headache, nausea, vomiting, diarrhea, abdominal pain.    He denies any similar episode in the past.  On further questioning, he says he follows with his regular doctor, his cardiologist routinely and they do blood work; but he was never aware of having low electrolytes in the past. So, he is unaware of onset and duration.   PAST MEDICAL HISTORY: 1. Coronary artery disease, status post stent in 2006. 2. Renal stone on the left side, status post lithotripsy and has permanently damaged left kidney.  3. Prostate cancer diagnosed eight years ago, status post radiation. 4. Hypertension.  PAST SURGICAL HISTORY: 1. Coronary stent. 2. Radiation to the prostate. 3. Lithotripsy for the renal stone.  ALLERGIES: Allergic to penicillin. When he was 81 years old, after getting a penicillin injection, he had a reaction and he needed to have an adrenaline injection.  FAMILY HISTORY: Mother died in  80s due to throat cancer. His father died of heart attack at the age of 20.   SOCIAL HISTORY: He is a retired Dealer. He quit smoking 50 years ago.  He smoked for almost 10 to 15 years.  He is currently chewing tobacco every day for the last 70 years. He stopped drinking alcohol 7 years ago. He denies any use of illegal drugs like heroin, cocaine, marijuana.   HOME MEDICATIONS:   1. Aspirin 81 mg once a day. 2. Atorvastatin 40 mg once a day.  3. Ferrous sulfate 325 mg two times a day. 4. Hydrochlorothiazide/losartan 25/100 mg, 1 tablet once a day. 5. Levothyroxine 50 mcg once a day. 6. Lovaza 1000 mg oral two times a day. 7. Nitroglycerin 0.4 mg sublingual tablet every five minutes as needed for chest pain. 8. Pantoprazole 40 mg two times a day. 9. Plavix 75 mg once a day. 10. Vitamin D3, 1000 units three times a day.  REVIEW OF SYSTEMS: CONSTITUTIONAL: Denies any fever, weakness.  He has weight loss of 5 pounds in the last 6 to 8 months. EYES: Denies any blurred or double vision, pain or redness.  ENT: Denies tinnitus, ear pain or hearing loss. RESPIRATORY: Denies any cough, wheezing, hemoptysis or shortness of breath. CARDIOVASCULAR: Denies chest pain, palpitations, syncope.  GASTROINTESTINAL: Denies nausea, vomiting, diarrhea, abdominal pain or blood per rectum. GENITOURINARY: Denies any dysuria, hematuria, frequency. He has complaint of hernia. HEMATOLOGIC: Denies anemia, easy bruising or bleeding. SKIN: Denies any rash or lesions or change in moles on the skin. MUSCULOSKELETAL: Denies  any joint pain or swelling.  NEUROLOGICAL:   Denies any numbness, weakness, imbalance or fall.  PSYCHIATRIC: Denies any anxiety, insomnia or seizures.   PHYSICAL EXAMINATION:  VITAL SIGNS: Pulse rate 75, temperature 98.4, blood pressure 118/74, respiration 16. Oxygen saturation 94 on room air.   GENERAL: He is fully alert and oriented and not in any acute distress.  HEENT: Conjunctivae are pink.  Oral  mucosa are moist.    NECK: No thyroid enlargement nor any lymph nodes palpable.   LUNGS: Bilaterally equal air entry.  No use of accessory muscles.  Nontender on the chest.  No crackles or rhonchi heard.   CARDIOVASCULAR: Regular rate and rhythm, no murmurs, no jugular venous distention, no pedal edema.   ABDOMEN: Nontender, no distention.  No enlargement of the liver or spleen.  Bowel sounds are normal. Right lower abdominal wall hernia present, nontender.   GENITOURINARY: No discharge, redness or swelling from penis or scrotum.   EXTREMITIES: No swelling or joint tenderness.   SKIN: Multiple brownish moles present on the skin all over the body.  No rash.   NEUROLOGICAL:  Cranial nerves are normal. Power 5 out of 5 in all four limbs. Sensation normal. No tremors. Follows commands.   PSYCHIATRIC: Does not appear in any acute distress or anxiety. Fully alert and oriented and cooperative.   LABORATORY, DIAGNOSTIC AND RADIOLOGICAL DATA: Glucose 135, BUN 23, creatinine 2.13, sodium 138, potassium 1.9, chloride 98, CO2 32, calcium 8.8, magnesium 1.9. Total protein 5, albumin 3.5, bilirubin 1, alkaline phosphatase 75, SGOT 33, SGPT 30. WBC 7.6, RBC 5.23, hemoglobin 15.1, hematocrit 43.9, platelets 211, MCV 84. EKG: Right bundle branch block.   ASSESSMENT: An 81 year old male sent from his cardiologist's office because of hypokalemia.  He denies any complaint. Potassium was 1.9, critically low.   PLAN:  1. Hypokalemia: He received one dose of 40 mEq oral, and he is receiving 20 mEq in IV potassium. We will admit to telemetry for monitoring any arrhythmia or cardiac event.  We will continue to aggressively replace the amount of potassium and will recheck in the evening for correction.  Magnesium level was checked and was 1.9. Most likely the reason of hypokalemia seems diuretic, hydrochlorothiazide, which he was taking for hypertension, which we have stopped.  2. Chronic renal failure: Creatinine  2.13. He has history of left-sided renal stone and is status post lithotripsy with a permanently damaged left kidney. We don't have available past creatinine level. It seems like he had a chronic problem.  3. Coronary artery disease: We will continue his aspirin, Plavix, losartan and atorvastatin. He is not on a beta blocker probably because of his right bundle branch block, following with a cardiologist; so we will discuss this further with him.  4. Hypertension: We will not give hydrochlorothiazide.  We will continue losartan.  5. Hypothyroidism: We will continue Levothyroxine 150 mcg.  6. History of prostate cancer, status post radiation, stable.  7. Iron deficiency: Ferrous sulfate continued.  8. Deep venous thrombosis prophylaxis: We will give heparin as Lovenox is not indicated with renal failure.  9. Gastrointestinal prophylaxis: We will continue his home medication, pantoprazole.   CODE STATUS:  FULL CODE.     His critical condition and risk of arrhythmia and cardiac arrest were explained to him and his wife in the Emergency Room in full.  They understand and agree.   TOTAL TIME SPENT:  50 minutes, including 5 minutes of tobacco chewing cessation counseling.  He does not agree  at this time.  ____________________________ Ceasar Lund Anselm Jungling, MD vgv:cbb D: 03/05/2012 15:09:06 ET T: 03/05/2012 15:34:44 ET JOB#: [id number]  cc: Ceasar Lund. Anselm Jungling, MD, <Dictator> Meindert A. Brunetta Genera, MD Vaughan Basta MD ELECTRONICALLY SIGNED 03/26/2012 14:39

## 2014-11-10 ENCOUNTER — Ambulatory Visit
Admission: RE | Admit: 2014-11-10 | Discharge: 2014-11-10 | Disposition: A | Discharge status: Home / Self Care | Payer: Medicare Other | Source: Ambulatory Visit | Attending: Radiation Oncology | Admitting: Radiation Oncology

## 2014-11-10 ENCOUNTER — Ambulatory Visit: Payer: PRIVATE HEALTH INSURANCE | Admitting: Radiation Oncology

## 2014-11-10 ENCOUNTER — Other Ambulatory Visit: Payer: Self-pay | Admitting: *Deleted

## 2014-11-10 ENCOUNTER — Other Ambulatory Visit: Payer: Medicare Other

## 2014-11-10 ENCOUNTER — Encounter: Payer: Self-pay | Admitting: Radiation Oncology

## 2014-11-10 VITALS — BP 201/99 | HR 63 | Temp 96.3°F | Resp 22 | Ht 68.11 in | Wt 181.9 lb

## 2014-11-10 DIAGNOSIS — C61 Malignant neoplasm of prostate: Secondary | ICD-10-CM

## 2014-11-10 DIAGNOSIS — Z79818 Long term (current) use of other agents affecting estrogen receptors and estrogen levels: Secondary | ICD-10-CM | POA: Insufficient documentation

## 2014-11-10 DIAGNOSIS — Z923 Personal history of irradiation: Secondary | ICD-10-CM | POA: Diagnosis not present

## 2014-11-10 HISTORY — DX: Essential (primary) hypertension: I10

## 2014-11-10 HISTORY — DX: Age-related osteoporosis without current pathological fracture: M81.0

## 2014-11-10 NOTE — Evaluation (Signed)
Radiation Oncology Follow up Note  Name: [name]   Date:   11/10/2014 MRN:  [id number] DOB: Aug 30, 1926    This 81 y.o. male presents to the clinic today for follow up. For prostate cancer  REFERRING PROVIDER: Dr. Bernardo Heater HPI: Patient is a 81 year old male treated back in 2006 for Gleason 7 adenocarcinoma the prostate has been on pulse Lupron therapy although his last PSA performed November 2015 was 0.4. Patient's had problems with congestive heart failure is seen today for routine follow-up. His last Lupron injection was in November 2015. He is doing well specifically denies diarrhea dysuria or any other GI/GU complaints he has nocturia 2 some urgency and frequency noted..  COMPLICATIONS OF TREATMENT: none  FOLLOW UP COMPLIANCE: keeps appointments   PHYSICAL EXAM:  BP 201/99 mmHg  Pulse 63  Temp(Src) 96.3 F (35.7 C)  Resp 22  Ht 5' 8.11" (1.73 m)  Wt 181 lb 14.1 oz (82.5 kg)  BMI 27.57 kg/m2 Well-developed elderly male in NAD.Well-developed well-nourished patient in NAD. HEENT reveals PERLA, EOMI, discs not visualized.  Oral cavity is clear. No oral mucosal lesions are identified. Neck is clear without evidence of cervical or supraclavicular adenopathy. Lungs are clear to A&P. Cardiac examination is essentially unremarkable with regular rate and rhythm without murmur rub or thrill. Abdomen is benign with no organomegaly or masses noted. Motor sensory and DTR levels are equal and symmetric in the upper and lower extremities. Cranial nerves II through XII are grossly intact. Proprioception is intact. No peripheral adenopathy or edema is identified. No motor or sensory levels are noted. Crude visual fields are within normal range. Rectal exam was not performed today  RADIOLOGY RESULTS: No radiology results reviewed  PLAN: I have run a PSA level on him today and will report that separately. Should his PSA be stable will recheck again in 6 months and determine whether to pulse again  with Lupron or just leave the patient alone and continue to observe. Otherwise I'm please was overall progress. He generally is doing well at his advanced age.  I would like to take this opportunity for allowing me to participate in the care of your patient.Armstead Peaks., MD

## 2014-11-11 LAB — PSA: PSA: 3.65 ng/mL (ref 0.00–4.00)

## 2015-06-03 ENCOUNTER — Inpatient Hospital Stay: Payer: Medicare Other

## 2015-06-03 ENCOUNTER — Inpatient Hospital Stay
Admission: EM | Admit: 2015-06-03 | Discharge: 2015-06-07 | DRG: 378 | Disposition: A | Discharge status: Home / Self Care | Payer: Medicare Other | Attending: Internal Medicine | Admitting: Internal Medicine

## 2015-06-03 DIAGNOSIS — Z85828 Personal history of other malignant neoplasm of skin: Secondary | ICD-10-CM

## 2015-06-03 DIAGNOSIS — I130 Hypertensive heart and chronic kidney disease with heart failure and stage 1 through stage 4 chronic kidney disease, or unspecified chronic kidney disease: Secondary | ICD-10-CM | POA: Diagnosis present

## 2015-06-03 DIAGNOSIS — K625 Hemorrhage of anus and rectum: Secondary | ICD-10-CM | POA: Diagnosis not present

## 2015-06-03 DIAGNOSIS — I509 Heart failure, unspecified: Secondary | ICD-10-CM | POA: Diagnosis present

## 2015-06-03 DIAGNOSIS — E039 Hypothyroidism, unspecified: Secondary | ICD-10-CM | POA: Diagnosis present

## 2015-06-03 DIAGNOSIS — N183 Chronic kidney disease, stage 3 (moderate): Secondary | ICD-10-CM | POA: Diagnosis present

## 2015-06-03 DIAGNOSIS — F17200 Nicotine dependence, unspecified, uncomplicated: Secondary | ICD-10-CM | POA: Diagnosis present

## 2015-06-03 DIAGNOSIS — M810 Age-related osteoporosis without current pathological fracture: Secondary | ICD-10-CM | POA: Diagnosis present

## 2015-06-03 DIAGNOSIS — K922 Gastrointestinal hemorrhage, unspecified: Secondary | ICD-10-CM | POA: Diagnosis present

## 2015-06-03 DIAGNOSIS — Z955 Presence of coronary angioplasty implant and graft: Secondary | ICD-10-CM

## 2015-06-03 DIAGNOSIS — Z7902 Long term (current) use of antithrombotics/antiplatelets: Secondary | ICD-10-CM | POA: Diagnosis not present

## 2015-06-03 DIAGNOSIS — Z8546 Personal history of malignant neoplasm of prostate: Secondary | ICD-10-CM

## 2015-06-03 DIAGNOSIS — Z8249 Family history of ischemic heart disease and other diseases of the circulatory system: Secondary | ICD-10-CM | POA: Diagnosis not present

## 2015-06-03 DIAGNOSIS — E876 Hypokalemia: Secondary | ICD-10-CM | POA: Diagnosis present

## 2015-06-03 DIAGNOSIS — C44309 Unspecified malignant neoplasm of skin of other parts of face: Secondary | ICD-10-CM | POA: Diagnosis present

## 2015-06-03 DIAGNOSIS — K921 Melena: Secondary | ICD-10-CM | POA: Diagnosis present

## 2015-06-03 DIAGNOSIS — Z923 Personal history of irradiation: Secondary | ICD-10-CM

## 2015-06-03 DIAGNOSIS — I2510 Atherosclerotic heart disease of native coronary artery without angina pectoris: Secondary | ICD-10-CM | POA: Diagnosis present

## 2015-06-03 DIAGNOSIS — E7800 Pure hypercholesterolemia, unspecified: Secondary | ICD-10-CM | POA: Diagnosis present

## 2015-06-03 DIAGNOSIS — K5791 Diverticulosis of intestine, part unspecified, without perforation or abscess with bleeding: Secondary | ICD-10-CM | POA: Diagnosis present

## 2015-06-03 DIAGNOSIS — Z7982 Long term (current) use of aspirin: Secondary | ICD-10-CM

## 2015-06-03 HISTORY — DX: Heart failure, unspecified: I50.9

## 2015-06-03 LAB — HEMOGLOBIN AND HEMATOCRIT, BLOOD
HCT: 34.6 % — ABNORMAL LOW (ref 40.0–52.0)
HEMATOCRIT: 36.1 % — AB (ref 40.0–52.0)
HEMOGLOBIN: 11.6 g/dL — AB (ref 13.0–18.0)
Hemoglobin: 12.5 g/dL — ABNORMAL LOW (ref 13.0–18.0)

## 2015-06-03 LAB — TYPE AND SCREEN
ABO/RH(D): AB POS
Antibody Screen: NEGATIVE

## 2015-06-03 LAB — CBC
HEMATOCRIT: 39.7 % — AB (ref 40.0–52.0)
HEMOGLOBIN: 13.2 g/dL (ref 13.0–18.0)
MCH: 29.6 pg (ref 26.0–34.0)
MCHC: 33.4 g/dL (ref 32.0–36.0)
MCV: 88.6 fL (ref 80.0–100.0)
PLATELETS: 148 10*3/uL — AB (ref 150–440)
RBC: 4.48 MIL/uL (ref 4.40–5.90)
RDW: 14.9 % — ABNORMAL HIGH (ref 11.5–14.5)
WBC: 6.7 10*3/uL (ref 3.8–10.6)

## 2015-06-03 LAB — ABO/RH: ABO/RH(D): AB POS

## 2015-06-03 LAB — COMPREHENSIVE METABOLIC PANEL
ALT: 19 U/L (ref 17–63)
AST: 24 U/L (ref 15–41)
Albumin: 3.7 g/dL (ref 3.5–5.0)
Alkaline Phosphatase: 59 U/L (ref 38–126)
Anion gap: 11 (ref 5–15)
BUN: 25 mg/dL — ABNORMAL HIGH (ref 6–20)
CHLORIDE: 106 mmol/L (ref 101–111)
CO2: 23 mmol/L (ref 22–32)
CREATININE: 2.04 mg/dL — AB (ref 0.61–1.24)
Calcium: 8.3 mg/dL — ABNORMAL LOW (ref 8.9–10.3)
GFR calc non Af Amer: 28 mL/min — ABNORMAL LOW (ref >60)
GFR, EST AFRICAN AMERICAN: 32 mL/min — AB (ref >60)
Glucose, Bld: 198 mg/dL — ABNORMAL HIGH (ref 65–99)
Potassium: 2.8 mmol/L — CL (ref 3.5–5.1)
SODIUM: 140 mmol/L (ref 135–145)
Total Bilirubin: 0.6 mg/dL (ref 0.3–1.2)
Total Protein: 6.2 g/dL — ABNORMAL LOW (ref 6.5–8.1)

## 2015-06-03 LAB — GLUCOSE, CAPILLARY: GLUCOSE-CAPILLARY: 161 mg/dL — AB (ref 65–99)

## 2015-06-03 LAB — TROPONIN I

## 2015-06-03 LAB — MAGNESIUM: MAGNESIUM: 2 mg/dL (ref 1.7–2.4)

## 2015-06-03 MED ORDER — ALBUTEROL SULFATE (2.5 MG/3ML) 0.083% IN NEBU: 2.5000 mg | INHALATION_SOLUTION | Freq: Four times a day (QID) | RESPIRATORY_TRACT | Status: DC | PRN

## 2015-06-03 MED ORDER — INSULIN ASPART 100 UNIT/ML ~~LOC~~ SOLN: 0.0000 [IU] | Freq: Every day | SUBCUTANEOUS | Status: DC

## 2015-06-03 MED ORDER — FERROUS SULFATE 325 (65 FE) MG PO TABS
325.0000 mg | ORAL_TABLET | Freq: Every day | ORAL | Status: DC
  Administered 2015-06-04 – 2015-06-07 (×4): 325 mg via ORAL
  Filled 2015-06-03 (×4): qty 1

## 2015-06-03 MED ORDER — ACETAMINOPHEN 325 MG PO TABS: 650.0000 mg | ORAL_TABLET | Freq: Four times a day (QID) | ORAL | Status: DC | PRN

## 2015-06-03 MED ORDER — ONDANSETRON HCL 4 MG/2ML IJ SOLN: 4.0000 mg | Freq: Four times a day (QID) | INTRAMUSCULAR | Status: DC | PRN

## 2015-06-03 MED ORDER — LEVOTHYROXINE SODIUM 100 MCG PO TABS
100.0000 ug | ORAL_TABLET | Freq: Every day | ORAL | Status: DC
  Administered 2015-06-04 – 2015-06-07 (×4): 100 ug via ORAL
  Filled 2015-06-03 (×4): qty 1

## 2015-06-03 MED ORDER — ALBUTEROL SULFATE HFA 108 (90 BASE) MCG/ACT IN AERS: 2.0000 | INHALATION_SPRAY | Freq: Four times a day (QID) | RESPIRATORY_TRACT | Status: DC | PRN

## 2015-06-03 MED ORDER — TORSEMIDE 20 MG PO TABS
20.0000 mg | ORAL_TABLET | Freq: Every day | ORAL | Status: DC
  Administered 2015-06-03 – 2015-06-06 (×4): 20 mg via ORAL
  Filled 2015-06-03 (×4): qty 1

## 2015-06-03 MED ORDER — SODIUM CHLORIDE 0.9 % IJ SOLN
3.0000 mL | Freq: Two times a day (BID) | INTRAMUSCULAR | Status: DC
  Administered 2015-06-04 – 2015-06-06 (×3): 3 mL via INTRAVENOUS

## 2015-06-03 MED ORDER — NITROGLYCERIN 0.4 MG SL SUBL: 0.4000 mg | SUBLINGUAL_TABLET | SUBLINGUAL | Status: DC | PRN

## 2015-06-03 MED ORDER — PANTOPRAZOLE SODIUM 40 MG IV SOLR
40.0000 mg | Freq: Two times a day (BID) | INTRAVENOUS | Status: DC
  Administered 2015-06-03 – 2015-06-06 (×6): 40 mg via INTRAVENOUS
  Filled 2015-06-03 (×6): qty 40

## 2015-06-03 MED ORDER — POTASSIUM CHLORIDE CRYS ER 20 MEQ PO TBCR
40.0000 meq | EXTENDED_RELEASE_TABLET | Freq: Once | ORAL | Status: AC
  Administered 2015-06-03: 40 meq via ORAL
  Filled 2015-06-03: qty 2

## 2015-06-03 MED ORDER — ONDANSETRON HCL 4 MG PO TABS: 4.0000 mg | ORAL_TABLET | Freq: Four times a day (QID) | ORAL | Status: DC | PRN

## 2015-06-03 MED ORDER — METOPROLOL SUCCINATE ER 50 MG PO TB24
50.0000 mg | ORAL_TABLET | Freq: Every day | ORAL | Status: DC
  Administered 2015-06-04 – 2015-06-05 (×2): 50 mg via ORAL
  Filled 2015-06-03 (×2): qty 1

## 2015-06-03 MED ORDER — SODIUM CHLORIDE 0.9 % IV SOLN
INTRAVENOUS | Status: DC
  Administered 2015-06-03 – 2015-06-05 (×4): via INTRAVENOUS

## 2015-06-03 MED ORDER — TECHNETIUM TC 99M-LABELED RED BLOOD CELLS IV KIT
19.0600 | PACK | Freq: Once | INTRAVENOUS | Status: AC | PRN
  Administered 2015-06-03: 19.06 via INTRAVENOUS

## 2015-06-03 MED ORDER — HYDRALAZINE HCL 25 MG PO TABS
25.0000 mg | ORAL_TABLET | Freq: Two times a day (BID) | ORAL | Status: DC
  Administered 2015-06-03 – 2015-06-07 (×7): 25 mg via ORAL
  Filled 2015-06-03 (×8): qty 1

## 2015-06-03 MED ORDER — SODIUM CHLORIDE 0.9 % IV SOLN
Freq: Once | INTRAVENOUS | Status: AC
  Administered 2015-06-04: 01:00:00 via INTRAVENOUS

## 2015-06-03 MED ORDER — INSULIN ASPART 100 UNIT/ML ~~LOC~~ SOLN
0.0000 [IU] | Freq: Three times a day (TID) | SUBCUTANEOUS | Status: DC
  Administered 2015-06-05 – 2015-06-06 (×2): 1 [IU] via SUBCUTANEOUS
  Filled 2015-06-03: qty 2
  Filled 2015-06-03 (×3): qty 1

## 2015-06-03 MED ORDER — ALUM & MAG HYDROXIDE-SIMETH 200-200-20 MG/5ML PO SUSP: 30.0000 mL | Freq: Four times a day (QID) | ORAL | Status: DC | PRN

## 2015-06-03 MED ORDER — DOXAZOSIN MESYLATE 4 MG PO TABS
8.0000 mg | ORAL_TABLET | Freq: Every day | ORAL | Status: DC
  Administered 2015-06-04 – 2015-06-06 (×3): 8 mg via ORAL
  Filled 2015-06-03 (×3): qty 2

## 2015-06-03 MED ORDER — PANTOPRAZOLE SODIUM 40 MG PO TBEC: 40.0000 mg | DELAYED_RELEASE_TABLET | Freq: Two times a day (BID) | ORAL | Status: DC

## 2015-06-03 MED ORDER — ACETAMINOPHEN 650 MG RE SUPP
650.0000 mg | Freq: Four times a day (QID) | RECTAL | Status: DC | PRN
Start: 2015-06-03 — End: 2015-06-07

## 2015-06-03 MED ORDER — POTASSIUM CHLORIDE CRYS ER 10 MEQ PO TBCR
10.0000 meq | EXTENDED_RELEASE_TABLET | Freq: Every day | ORAL | Status: DC
  Administered 2015-06-04 – 2015-06-07 (×4): 10 meq via ORAL
  Filled 2015-06-03 (×9): qty 1

## 2015-06-03 MED ORDER — LOSARTAN POTASSIUM 50 MG PO TABS
100.0000 mg | ORAL_TABLET | Freq: Every day | ORAL | Status: DC
  Administered 2015-06-03 – 2015-06-06 (×4): 100 mg via ORAL
  Filled 2015-06-03: qty 4
  Filled 2015-06-03 (×3): qty 2

## 2015-06-03 MED ORDER — SENNOSIDES-DOCUSATE SODIUM 8.6-50 MG PO TABS: 1.0000 | ORAL_TABLET | Freq: Every evening | ORAL | Status: DC | PRN

## 2015-06-03 MED ORDER — OMEGA-3-ACID ETHYL ESTERS 1 G PO CAPS
1.0000 | ORAL_CAPSULE | Freq: Two times a day (BID) | ORAL | Status: DC
  Administered 2015-06-03 – 2015-06-06 (×6): 1 g via ORAL
  Filled 2015-06-03 (×6): qty 1

## 2015-06-03 MED ORDER — POTASSIUM CHLORIDE 10 MEQ/100ML IV SOLN
10.0000 meq | INTRAVENOUS | Status: AC
  Administered 2015-06-03 (×3): 10 meq via INTRAVENOUS
  Filled 2015-06-03 (×6): qty 100

## 2015-06-03 MED ORDER — ATORVASTATIN CALCIUM 20 MG PO TABS
40.0000 mg | ORAL_TABLET | Freq: Every day | ORAL | Status: DC
  Administered 2015-06-04 – 2015-06-06 (×3): 40 mg via ORAL
  Filled 2015-06-03 (×3): qty 2

## 2015-06-03 NOTE — ED Notes (Signed)
Up to bathroom , had large episode of dark redd to bright red rectal bleeding

## 2015-06-03 NOTE — H&P (Signed)
[facility] NAME: [name]    MR#:  [id number]  DATE OF BIRTH:  [birth year]  DATE OF ADMISSION:  06/03/2015  PRIMARY CARE PHYSICIAN: Lorelee Market, MD   REQUESTING/REFERRING PHYSICIAN: Dr Reita Cliche  CHIEF COMPLAINT:  Rectal bleeding HISTORY OF PRESENT ILLNESS:  [name]  is a 81 y.o. male with a known history of coronary artery disease and prostate cancer who presents with above complaint. Patient reports since this morning is had numerous bowel movements with bright red blood per rectum. He denies abdominal pain associated with this. He is currently on aspirin and Plavix which he says he takes for stents that have been placed in his heart. He has never had symptoms of rectal bleeding in the past. He denies nausea, vomiting or shortness of breath.  PAST MEDICAL HISTORY:   Past Medical History  Diagnosis Date  . Prostate cancer ([place])   . Skin cancer     right cheek  . Hypercholesteremia   . Cardiac disease   . Shingles   . Kidney failure     left  . Hypertension   . Osteoporosis   . CHF (congestive heart failure) ([place])     PAST SURGICAL HISTORY:   Past Surgical History  Procedure Laterality Date  . Cardiac catheterization  04/13/2014    SOCIAL HISTORY:   Social History  Substance Use Topics  . Smoking status: Former Smoker    Types: Cigarettes, Cigars  . Smokeless tobacco: Current User    Types: Chew  . Alcohol Use: No    FAMILY HISTORY:  Positive history of hypertension and CAD  DRUG ALLERGIES:   Allergies  Allergen Reactions  . Penicillin G Other (See Comments)    Has patient had a PCN reaction causing immediate rash, facial/tongue/throat swelling, SOB or lightheadedness with hypotension: Yes Has patient had a PCN reaction causing severe rash involving mucus membranes or skin necrosis: No Has patient had a PCN reaction that required hospitalization No Has patient had a PCN reaction  occurring within the last 10 years: No If all of the above answers are "NO", then may proceed with Cephalosporin use.      REVIEW OF SYSTEMS:  CONSTITUTIONAL: No fever, fatigue or weakness.  EYES: No blurred or double vision.  EARS, NOSE, AND THROAT: No tinnitus or ear pain.  RESPIRATORY: No cough, shortness of breath, wheezing or hemoptysis.  CARDIOVASCULAR: No chest pain, orthopnea, edema.  GASTROINTESTINAL: No nausea, vomiting, diarrhea or abdominal pain. Positive rectal bleeding GENITOURINARY: No dysuria, hematuria.  ENDOCRINE: No polyuria, nocturia,  HEMATOLOGY: ++ anemia, easy bruising ++ GIB SKIN: No rash or lesion. MUSCULOSKELETAL: Positive for joint pain and arthritis.   NEUROLOGIC: No tingling, numbness, weakness.  PSYCHIATRY: No anxiety or depression.   MEDICATIONS AT HOME:   Prior to Admission medications   Medication Sig Start Date End Date Taking? Authorizing Provider  albuterol (PROVENTIL HFA;VENTOLIN HFA) 108 (90 BASE) MCG/ACT inhaler Inhale 2 puffs into the lungs every 6 (six) hours as needed for wheezing or shortness of breath.   Yes Historical Provider, MD  aspirin EC 81 MG tablet Take 81 mg by mouth daily.   Yes Historical Provider, MD  atorvastatin (LIPITOR) 40 MG tablet Take 40 mg by mouth daily.   Yes Historical Provider, MD  clopidogrel (PLAVIX) 75 MG tablet Take 75 mg by mouth daily.   Yes Historical Provider, MD  doxazosin (CARDURA) 8 MG tablet Take 8 mg by mouth daily.  Yes Historical Provider, MD  ferrous sulfate 325 (65 FE) MG tablet Take 325 mg by mouth daily with breakfast.   Yes Historical Provider, MD  hydrALAZINE (APRESOLINE) 25 MG tablet Take 1 tablet by mouth 2 (two) times daily. 10/14/14  Yes Historical Provider, MD  levothyroxine (SYNTHROID, LEVOTHROID) 100 MCG tablet Take 100 mcg by mouth daily.   Yes Historical Provider, MD  losartan (COZAAR) 100 MG tablet Take 100 mg by mouth daily.   Yes Historical Provider, MD  metoprolol succinate  (TOPROL-XL) 50 MG 24 hr tablet Take 50 mg by mouth daily. Take with or immediately following a meal.   Yes Historical Provider, MD  nitroGLYCERIN (NITROSTAT) 0.4 MG SL tablet Place 0.4 mg under the tongue every 5 (five) minutes as needed for chest pain.   Yes Historical Provider, MD  omega-3 acid ethyl esters (LOVAZA) 1 G capsule Take 1 capsule by mouth 2 (two) times daily.   Yes Historical Provider, MD  pantoprazole (PROTONIX) 40 MG tablet Take 40 mg by mouth 2 (two) times daily.    Yes Historical Provider, MD  potassium chloride (K-DUR) 10 MEQ tablet Take 10 mEq by mouth daily.   Yes Historical Provider, MD  torsemide (DEMADEX) 20 MG tablet Take 20 mg by mouth daily.   Yes Historical Provider, MD      VITAL SIGNS:  Blood pressure 128/70, pulse 71, temperature 98 F (36.7 C), temperature source Oral, resp. rate 20, height 5\' 6"  (1.676 m), weight 81.647 kg (180 lb), SpO2 97 %.  PHYSICAL EXAMINATION:  GENERAL:  81 y.o.-year-old patient lying in the bed with no acute distress.  EYES: Pupils equal, round, reactive to light and accommodation. No scleral icterus. Extraocular muscles intact.  HEENT: Head atraumatic, normocephalic. Oropharynx and nasopharynx clear.  NECK:  Supple, no jugular venous distention. No thyroid enlargement, no tenderness.  LUNGS: Normal breath sounds bilaterally, no wheezing, rales,rhonchi or crepitation. No use of accessory muscles of respiration.  CARDIOVASCULAR: S1, S2 normal. No murmurs, rubs, or gallops.  ABDOMEN: Soft, nontender, nondistended. Bowel sounds present. No organomegaly or mass.  EXTREMITIES: No pedal edema, cyanosis, or clubbing.  NEUROLOGIC: Cranial nerves II through XII are grossly intact. No focal deficits. PSYCHIATRIC: The patient is alert and oriented x 3.  SKIN: No obvious rash, lesion, or ulcer.   LABORATORY PANEL:   CBC  Recent Labs Lab 06/03/15 1019  WBC 6.7  HGB 13.2  HCT 39.7*  PLT 148*    ------------------------------------------------------------------------------------------------------------------  Chemistries  ------------------------------------------------------------------------------------------------------------------  Cardiac Enzymes  Recent Labs Lab 06/03/15 1019  TROPONINI <0.03   ------------------------------------------------------------------------------------------------------------------  RADIOLOGY:  No results found.  EKG:   Normal sinus rhythm with occasional PVCs no ST elevation or depression. There are T-wave abnormalities in the inferior leads  IMPRESSION AND PLAN:    81 year old male presents with bright red blood per rectum.  1. Bright red blood per rectum/GI bleed: Patient continues to have rectal bleeding. I have ordered a GI bleeding scan. Hemoglobin will be ordered every 6 hours. GI consultation has been placed. Patient will continue on Protonix 40 IV twice a day. Patient may require colonoscopy. His last colonoscopy he reports was 1-2 years ago.  2. CAD: Patient reports he has stents in his heart and he is on Plavix and aspirin. Both of these medications are being held due to problem #1. I will consult Dr. Darrow Bussing his cardiologist for further evaluation and management plans.  3. Chronic kidney disease stage III: It appears as base and creatinine is 1.9-2.0.  His creatinine appears to be at baseline this time. Continue to monitor his creatinine. Patient would best benefit from outpatient nephrology follow-up.  4. Hypothyroidism: Continue Synthroid  5. Essential hypertension: Continue hydralazine, metoprolol, torsemide and losartan.   6. Nicotine dependence: Patient is encouraged to stop using his smokeless tobacco. Patient is counseled for 3 minutes regarding this. Patient is not interested in quitting.   All the records are reviewed and case discussed with ED provider. Management plans discussed with the patient and he is in  agreement.  CODE STATUS: FULL  TOTAL TIME TAKING CARE OF THIS PATIENT: 50 minutes.    Madai Nuccio M.D on 06/03/2015 at 12:29 PM  Between 7am to 6pm - Pager - [phone] After 6pm go to [email or web address] - password [email or web address]  Office  [phone]  CC: Primary care physician; Lorelee Market, MD

## 2015-06-03 NOTE — Progress Notes (Signed)
[name] is a 81 y.o. male  [id number]  Primary Cardiologist: Neoma Laming Reason for Consultation: Coronary artery disease status post GI bleed needing clearance.  HPI: This is a 81 year old white male with a past medical history of PCI and stenting and is on aspirin and Plavix presented to the hospital with bright red blood per stool.. Patient denies any chest pain shortness of breath orthopnea or PND.   Review of Systems: No chest pain shortness of breath swelling of the legs or dizziness   Past Medical History  Diagnosis Date  . Prostate cancer ([place])   . Skin cancer     right cheek  . Hypercholesteremia   . Cardiac disease   . Shingles   . Kidney failure     left  . Hypertension   . Osteoporosis   . CHF (congestive heart failure) ([facility])     Medications Prior to Admission  Medication Sig Dispense Refill  . albuterol (PROVENTIL HFA;VENTOLIN HFA) 108 (90 BASE) MCG/ACT inhaler Inhale 2 puffs into the lungs every 6 (six) hours as needed for wheezing or shortness of breath.    Marland Kitchen aspirin EC 81 MG tablet Take 81 mg by mouth daily.    Marland Kitchen atorvastatin (LIPITOR) 40 MG tablet Take 40 mg by mouth daily.    . clopidogrel (PLAVIX) 75 MG tablet Take 75 mg by mouth daily.    Marland Kitchen doxazosin (CARDURA) 8 MG tablet Take 8 mg by mouth daily.    . ferrous sulfate 325 (65 FE) MG tablet Take 325 mg by mouth daily with breakfast.    . hydrALAZINE (APRESOLINE) 25 MG tablet Take 1 tablet by mouth 2 (two) times daily.    Marland Kitchen levothyroxine (SYNTHROID, LEVOTHROID) 100 MCG tablet Take 100 mcg by mouth daily.    Marland Kitchen losartan (COZAAR) 100 MG tablet Take 100 mg by mouth daily.    . metoprolol succinate (TOPROL-XL) 50 MG 24 hr tablet Take 50 mg by mouth daily. Take with or immediately following a meal.    . nitroGLYCERIN (NITROSTAT) 0.4 MG SL tablet Place 0.4 mg under the tongue every 5 (five) minutes as needed for chest pain.    Marland Kitchen omega-3 acid ethyl esters (LOVAZA) 1 G capsule Take 1 capsule by mouth 2  (two) times daily.    . pantoprazole (PROTONIX) 40 MG tablet Take 40 mg by mouth 2 (two) times daily.     . potassium chloride (K-DUR) 10 MEQ tablet Take 10 mEq by mouth daily.    Marland Kitchen torsemide (DEMADEX) 20 MG tablet Take 20 mg by mouth daily.       Derrill Memo ON 06/04/2015] atorvastatin  40 mg Oral Daily  . [START ON 06/04/2015] doxazosin  8 mg Oral Daily  . [START ON 06/04/2015] ferrous sulfate  325 mg Oral Q breakfast  . hydrALAZINE  25 mg Oral BID  . insulin aspart  0-5 Units Subcutaneous QHS  . insulin aspart  0-9 Units Subcutaneous TID WC  . [START ON 06/04/2015] levothyroxine  100 mcg Oral Daily  . losartan  100 mg Oral Daily  . [START ON 06/04/2015] metoprolol succinate  50 mg Oral Daily  . omega-3 acid ethyl esters  1 capsule Oral BID  . pantoprazole (PROTONIX) IV  40 mg Intravenous Q12H  . potassium chloride  10 mEq Oral Daily  . potassium chloride  10 mEq Intravenous Q1 Hr x 6  . sodium chloride  3 mL Intravenous Q12H  . torsemide  20 mg Oral Daily  Infusions: . sodium chloride      Allergies  Allergen Reactions  . Penicillin G Other (See Comments)    Has patient had a PCN reaction causing immediate rash, facial/tongue/throat swelling, SOB or lightheadedness with hypotension: Yes Has patient had a PCN reaction causing severe rash involving mucus membranes or skin necrosis: No Has patient had a PCN reaction that required hospitalization No Has patient had a PCN reaction occurring within the last 10 years: No If all of the above answers are "NO", then may proceed with Cephalosporin use.     Social History   Social History  . Marital Status: Married    Spouse Name: N/A  . Number of Children: N/A  . Years of Education: N/A   Occupational History  . Not on file.   Social History Main Topics  . Smoking status: Former Smoker    Types: Cigarettes, Cigars  . Smokeless tobacco: Current User    Types: Chew  . Alcohol Use: No  . Drug Use: Not on file  . Sexual  Activity: Not on file   Other Topics Concern  . Not on file   Social History Narrative    No family history on file.  PHYSICAL EXAM: Filed Vitals:   06/03/15 1431 06/03/15 1516  BP: 165/61 154/74  Pulse: 65 73  Temp:  98.6 F (37 C)  Resp:  18    No intake or output data in the 24 hours ending 06/03/15 1545  General:  Well appearing. No respiratory difficulty HEENT: normal Neck: supple. no JVD. Carotids 2+ bilat; no bruits. No lymphadenopathy or thryomegaly appreciated. Cor: PMI nondisplaced. Regular rate & rhythm. No rubs, gallops or murmurs. Lungs: clear Abdomen: soft, nontender, nondistended. No hepatosplenomegaly. No bruits or masses. Good bowel sounds. Extremities: no cyanosis, clubbing, rash, edema Neuro: alert & oriented x 3, cranial nerves grossly intact. moves all 4 extremities w/o difficulty. Affect pleasant.  ECG: Sinus rhythm with frequent PVCs and bigeminy, LVH with nonspecific ST-T changes  Results for orders placed or performed during the hospital encounter of 06/03/15 (from the past 24 hour(s))  Comprehensive metabolic panel     Status: Abnormal   Collection Time: 06/03/15 10:19 AM  Result Value Ref Range   Sodium 140 135 - 145 mmol/L   Potassium 2.8 (LL) 3.5 - 5.1 mmol/L   Chloride 106 101 - 111 mmol/L   CO2 23 22 - 32 mmol/L   Glucose, Bld 198 (H) 65 - 99 mg/dL   BUN 25 (H) 6 - 20 mg/dL   Creatinine, Ser 2.04 (H) 0.61 - 1.24 mg/dL   Calcium 8.3 (L) 8.9 - 10.3 mg/dL   Total Protein 6.2 (L) 6.5 - 8.1 g/dL   Albumin 3.7 3.5 - 5.0 g/dL   AST 24 15 - 41 U/L   ALT 19 17 - 63 U/L   Alkaline Phosphatase 59 38 - 126 U/L   Total Bilirubin 0.6 0.3 - 1.2 mg/dL   GFR calc non Af Amer 28 (L) >60 mL/min   GFR calc Af Amer 32 (L) >60 mL/min   Anion gap 11 5 - 15  CBC     Status: Abnormal   Collection Time: 06/03/15 10:19 AM  Result Value Ref Range   WBC 6.7 3.8 - 10.6 K/uL   RBC 4.48 4.40 - 5.90 MIL/uL   Hemoglobin 13.2 13.0 - 18.0 g/dL   HCT 39.7 (L)  40.0 - 52.0 %   MCV 88.6 80.0 - 100.0 fL   MCH 29.6 26.0 -  34.0 pg   MCHC 33.4 32.0 - 36.0 g/dL   RDW 14.9 (H) 11.5 - 14.5 %   Platelets 148 (L) 150 - 440 K/uL  Type and screen [facility]     Status: None   Collection Time: 06/03/15 10:19 AM  Result Value Ref Range   ABO/RH(D) AB POS    Antibody Screen NEG    Sample Expiration 06/06/2015   Troponin I     Status: None   Collection Time: 06/03/15 10:19 AM  Result Value Ref Range   Troponin I <0.03 <0.031 ng/mL  ABO/Rh     Status: None   Collection Time: 06/03/15 10:20 AM  Result Value Ref Range   ABO/RH(D) AB POS   Magnesium     Status: None   Collection Time: 06/03/15 12:12 PM  Result Value Ref Range   Magnesium 2.0 1.7 - 2.4 mg/dL   No results found.   ASSESSMENT AND PLAN: Possible GI bleed due to aspirin and Plavix. Advise continuing baby aspirin but discontinue Plavix and if he needs blood transfusion and EGD or colonoscopy advise proceeding with the procedure. He recently had CTA coronaries which showed his stents were patent. He would be low risk for EGD and colonoscopy. His potassium is to be corrected to 4.0 prior to doing EGD colonoscopy.  Kerra Guilfoil A

## 2015-06-03 NOTE — Consult Note (Signed)
GI Inpatient Consult Note  Reason for Consult: LGI bleed   Attending Requesting Consult: Mody  History of Present Illness: [name] is a 81 y.o. male with PMHx CAD on Plavix, Prostate Ca s/p XRT a/w rectal bleeding.  Bleeding started around 4 am this morning.  Severeal episodes of bright red blood. Then went to ED and had one or two more espisodes here in hospital. Blood now more dark red and less in quantity. No abd pain at all. + Increased gas prodcution.   No prev GI bleed. Last colon few years ago but not in system.  Says was norma.  No fam hx colon Ca.  Last plavix dose last night.   Hgb her is hospital down slightly to 12.5.  No c/p, SOB, n/v, abd pain, f/c.    Bleeding scan planned for later this afternoon.   Past Medical History:  Past Medical History  Diagnosis Date  . Prostate cancer ([place])   . Skin cancer     right cheek  . Hypercholesteremia   . Cardiac disease   . Shingles   . Kidney failure     left  . Hypertension   . Osteoporosis   . CHF (congestive heart failure) ([place])     Problem List: Patient Active Problem List   Diagnosis Date Noted  . GIB (gastrointestinal bleeding) 06/03/2015    Past Surgical History: Past Surgical History  Procedure Laterality Date  . Cardiac catheterization  04/13/2014    Allergies: Allergies  Allergen Reactions  . Penicillin G Other (See Comments)    Has patient had a PCN reaction causing immediate rash, facial/tongue/throat swelling, SOB or lightheadedness with hypotension: Yes Has patient had a PCN reaction causing severe rash involving mucus membranes or skin necrosis: No Has patient had a PCN reaction that required hospitalization No Has patient had a PCN reaction occurring within the last 10 years: No If all of the above answers are "NO", then may proceed with Cephalosporin use.     Home Medications: Prescriptions prior to admission  Medication Sig Dispense Refill Last Dose  . albuterol (PROVENTIL HFA;VENTOLIN  HFA) 108 (90 BASE) MCG/ACT inhaler Inhale 2 puffs into the lungs every 6 (six) hours as needed for wheezing or shortness of breath.   PRN  . aspirin EC 81 MG tablet Take 81 mg by mouth daily.   06/03/2015 at am  . atorvastatin (LIPITOR) 40 MG tablet Take 40 mg by mouth daily.   06/03/2015 at am  . clopidogrel (PLAVIX) 75 MG tablet Take 75 mg by mouth daily.   06/03/2015 at am  . doxazosin (CARDURA) 8 MG tablet Take 8 mg by mouth daily.   06/03/2015 at am  . ferrous sulfate 325 (65 FE) MG tablet Take 325 mg by mouth daily with breakfast.   06/03/2015 at am  . hydrALAZINE (APRESOLINE) 25 MG tablet Take 1 tablet by mouth 2 (two) times daily.   06/03/2015 at am  . levothyroxine (SYNTHROID, LEVOTHROID) 100 MCG tablet Take 100 mcg by mouth daily.   06/03/2015 at am  . losartan (COZAAR) 100 MG tablet Take 100 mg by mouth daily.   06/03/2015 at am  . metoprolol succinate (TOPROL-XL) 50 MG 24 hr tablet Take 50 mg by mouth daily. Take with or immediately following a meal.   06/03/2015 at am  . nitroGLYCERIN (NITROSTAT) 0.4 MG SL tablet Place 0.4 mg under the tongue every 5 (five) minutes as needed for chest pain.   PRN  . omega-3 acid  ethyl esters (LOVAZA) 1 G capsule Take 1 capsule by mouth 2 (two) times daily.   06/03/2015 at am  . pantoprazole (PROTONIX) 40 MG tablet Take 40 mg by mouth 2 (two) times daily.    06/03/2015 at am  . potassium chloride (K-DUR) 10 MEQ tablet Take 10 mEq by mouth daily.   06/03/2015 at am  . torsemide (DEMADEX) 20 MG tablet Take 20 mg by mouth daily.   06/03/2015 at am   Home medication reconciliation was completed with the patient.   Scheduled Inpatient Medications:   . [START ON 06/04/2015] atorvastatin  40 mg Oral Daily  . [START ON 06/04/2015] doxazosin  8 mg Oral Daily  . [START ON 06/04/2015] ferrous sulfate  325 mg Oral Q breakfast  . hydrALAZINE  25 mg Oral BID  . insulin aspart  0-5 Units Subcutaneous QHS  . insulin aspart  0-9 Units Subcutaneous TID WC  .  [START ON 06/04/2015] levothyroxine  100 mcg Oral Daily  . losartan  100 mg Oral Daily  . [START ON 06/04/2015] metoprolol succinate  50 mg Oral Daily  . omega-3 acid ethyl esters  1 capsule Oral BID  . pantoprazole (PROTONIX) IV  40 mg Intravenous Q12H  . potassium chloride  10 mEq Oral Daily  . potassium chloride  10 mEq Intravenous Q1 Hr x 6  . sodium chloride  3 mL Intravenous Q12H  . torsemide  20 mg Oral Daily    Continuous Inpatient Infusions:   . sodium chloride      PRN Inpatient Medications:  acetaminophen **OR** acetaminophen, albuterol, alum & mag hydroxide-simeth, nitroGLYCERIN, ondansetron **OR** ondansetron (ZOFRAN) IV, senna-docusate  Family History: family history is not on file.  The patient's family history is negative for inflammatory bowel disorders, GI malignancy, or solid organ transplantation.  Social History:   reports that he has quit smoking. His smoking use included Cigarettes and Cigars. His smokeless tobacco use includes Chew. He reports that he does not drink alcohol. The patient denies ETOH, tobacco, or drug use.   Review of Systems: Constitutional: Weight is stable.  Eyes: No changes in vision. ENT: No oral lesions, sore throat.  GI: see HPI.  Heme/Lymph: No easy bruising.  CV: No chest pain.  GU: No hematuria.  Integumentary: No rashes.  Neuro: No headaches.  Psych: No depression/anxiety.  Endocrine: No heat/cold intolerance.  Allergic/Immunologic: No urticaria.  Resp: No cough, +SOB Musculoskeletal: No joint swelling.    Physical Examination: BP 154/74 mmHg  Pulse 73  Temp(Src) 98.6 F (37 C) (Oral)  Resp 18  Ht 5\' 6"  (1.676 m)  Wt 81.103 kg (178 lb 12.8 oz)  BMI 28.87 kg/m2  SpO2 99% Gen: NAD, alert and oriented x 4 HEENT: PEERLA, EOMI, Neck: supple, no JVD or thyromegaly Chest: CTA bilaterally, no wheezes, crackles, or other adventitious sounds CV: RRR, no m/g/c/r Abd: soft, NT, ND, +BS in all four quadrants; no HSM,  guarding, ridigity, or rebound tenderness Ext: no edema, well perfused with 2+ pulses, Skin: no rash or lesions noted Lymph: no LAD  Data: Lab Results  Component Value Date   WBC 6.7 06/03/2015   HGB 12.5* 06/03/2015   HCT 36.1* 06/03/2015   MCV 88.6 06/03/2015   PLT 148* 06/03/2015    Recent Labs Lab 06/03/15 1019 06/03/15 1535  HGB 13.2 12.5*   Lab Results  Component Value Date   NA 140 06/03/2015   K 2.8* 06/03/2015   CL 106 06/03/2015   CO2 23 06/03/2015  BUN 25* 06/03/2015   CREATININE 2.04* 06/03/2015   Lab Results  Component Value Date   ALT 19 06/03/2015   AST 24 06/03/2015   ALKPHOS 59 06/03/2015   BILITOT 0.6 06/03/2015   No results for input(s): APTT, INR, PTT in the last 168 hours.   Assessment/Plan: Mr. Garten is a 82 y.o. male with lower GI bleed.  Bleeding decreasing in amt her in hospital but still passing blood. Hgb only down slightly to 12.5.   Likely diverticular. Bleeding scan pending.   Recommendations: - f/u bleeding scan.  - watch H/h until stable. - follow hemodynamics closely - hold Plavix for now although little role in diverticular bleed.  - no plans for colonoscopy given age and high likelihood this is diverticular.   Thank you for the consult. Please call with questions or concerns.  REIN, Grace Blight, MD

## 2015-06-03 NOTE — ED Notes (Signed)
MD at bedside. 

## 2015-06-03 NOTE — ED Notes (Signed)
Early am had large bm and fresh red rectal bleeding for several episodes, states bleeding is somewhat less, no abd pain

## 2015-06-03 NOTE — ED Notes (Signed)
Pt c/o red bloody stool over night last night, denies abd pain, N/V.Marland Kitchen Pt is on plavix.Marland Kitchen

## 2015-06-03 NOTE — Progress Notes (Signed)
GI Note:  Tagged scan positive for bleeding in splenic flexure.   I spoke to vascular surgery. They recommend 1 U platelets given plavix on board and monitor for hemodynamic changes overnight. Possible embo in am if still bleeding.  I also spoke to hospital ist who will call vascular surgery if he becomes unstable overnight for possible embolization.

## 2015-06-03 NOTE — ED Provider Notes (Signed)
[facility] Emergency Department Provider Note   ____________________________________________  Time seen:  I have reviewed the triage vital signs and the triage nursing note.  HISTORY  Chief Complaint GI Bleeding   Historian Patient and spouse  HPI [name] is a 81 y.o. male is here for evaluation of rectal bleeding. Bloody stool with bright red blood per rectum last night, and another large bloody BM this morning. Patient does take aspirin and Plavix. No abdominal pain. No rectal pain. No history of rectal bleeding in the past. He does report he had "the piles "when he was a "young man" but this was many many years ago.  Symptoms are mild to moderate. No dizziness or lightheadedness. No chest pain. No trouble breathing or shortness of breath.    Past Medical History  Diagnosis Date  . Prostate cancer ([place])   . Skin cancer     right cheek  . Hypercholesteremia   . Cardiac disease   . Shingles   . Kidney failure     left  . Hypertension   . Osteoporosis   . CHF (congestive heart failure) ([place])     There are no active problems to display for this patient.   Past Surgical History  Procedure Laterality Date  . Cardiac catheterization  04/13/2014    Current Outpatient Rx  Name  Route  Sig  Dispense  Refill  . albuterol (PROVENTIL HFA;VENTOLIN HFA) 108 (90 BASE) MCG/ACT inhaler   Inhalation   Inhale 2 puffs into the lungs every 6 (six) hours as needed for wheezing or shortness of breath.         Marland Kitchen aspirin EC 81 MG tablet   Oral   Take 81 mg by mouth daily.         Marland Kitchen atorvastatin (LIPITOR) 40 MG tablet   Oral   Take 40 mg by mouth daily.         . clopidogrel (PLAVIX) 75 MG tablet   Oral   Take 75 mg by mouth daily.         Marland Kitchen doxazosin (CARDURA) 8 MG tablet   Oral   Take 8 mg by mouth daily.         . ferrous sulfate 325 (65 FE) MG tablet   Oral   Take 325 mg by mouth daily with breakfast.         . hydrALAZINE  (APRESOLINE) 25 MG tablet   Oral   Take 1 tablet by mouth 2 (two) times daily.         Marland Kitchen levothyroxine (SYNTHROID, LEVOTHROID) 100 MCG tablet   Oral   Take 100 mcg by mouth daily.         Marland Kitchen losartan (COZAAR) 100 MG tablet   Oral   Take 100 mg by mouth daily.         . metoprolol succinate (TOPROL-XL) 50 MG 24 hr tablet   Oral   Take 50 mg by mouth daily. Take with or immediately following a meal.         . nitroGLYCERIN (NITROSTAT) 0.4 MG SL tablet   Sublingual   Place 0.4 mg under the tongue every 5 (five) minutes as needed for chest pain.         Marland Kitchen omega-3 acid ethyl esters (LOVAZA) 1 G capsule   Oral   Take 1 capsule by mouth 2 (two) times daily.         . pantoprazole (PROTONIX) 40 MG tablet  Oral   Take 40 mg by mouth 2 (two) times daily.          . potassium chloride (K-DUR) 10 MEQ tablet   Oral   Take 10 mEq by mouth daily.         Marland Kitchen torsemide (DEMADEX) 20 MG tablet   Oral   Take 20 mg by mouth daily.           Allergies Penicillin g  No family history on file.  Social History Social History  Substance Use Topics  . Smoking status: Former Smoker    Types: Cigarettes, Cigars  . Smokeless tobacco: Current User    Types: Chew  . Alcohol Use: No    Review of Systems  Constitutional: Negative for fever. Eyes: Negative for visual changes. ENT: Negative for sore throat. Cardiovascular: Negative for chest pain. Respiratory: Negative for shortness of breath. Gastrointestinal: Negative for abdominal pain, vomiting and diarrhea. Some problem with constipation. Genitourinary: Negative for dysuria. Musculoskeletal: Negative for back pain. Skin: Negative for rash. Neurological: Negative for headache. 10 point Review of Systems otherwise negative ____________________________________________   PHYSICAL EXAM:  VITAL SIGNS: ED Triage Vitals  Enc Vitals Group     BP 06/03/15 1015 155/72 mmHg     Pulse Rate 06/03/15 1015 60     Resp  06/03/15 1015 20     Temp 06/03/15 1015 98 F (36.7 C)     Temp Source 06/03/15 1015 Oral     SpO2 06/03/15 1015 98 %     Weight 06/03/15 1015 180 lb (81.647 kg)     Height 06/03/15 1015 5\' 6"  (1.676 m)     Head Cir --      Peak Flow --      Pain Score --      Pain Loc --      Pain Edu? --      Excl. in [place]? --      Constitutional: Alert and oriented. Well appearing and in no distress. Eyes: Conjunctivae are normal. PERRL. Normal extraocular movements. ENT   Head: Normocephalic and atraumatic.   Nose: No congestion/rhinnorhea.   Mouth/Throat: Mucous membranes are moist.   Neck: No stridor. Cardiovascular/Chest: Irregularly irregular.  No murmurs, rubs, or gallops. Respiratory: Normal respiratory effort without tachypnea nor retractions. Breath sounds are clear and equal bilaterally. No wheezes/rales/rhonchi. Gastrointestinal: Soft. No distention, no guarding, no rebound. Nontender   Genitourinary/rectal: No external hemorrhoids. Nontender rectal exam. Dark red blood on rectal exam. Musculoskeletal: Nontender with normal range of motion in all extremities. No joint effusions.  No lower extremity tenderness.  No edema. Neurologic:  Normal speech and language. No gross or focal neurologic deficits are appreciated. Skin:  Skin is warm, dry and intact. No rash noted. Psychiatric: Mood and affect are normal. Speech and behavior are normal. Patient exhibits appropriate insight and judgment.  ____________________________________________   EKG I, Lisa Roca, MD, the attending physician have personally viewed and interpreted all ECGs.  89 bpm. Right bundle branch block. Sinus rhythm with frequent PVCs. Normal axis. Nonspecific ST and T-wave ____________________________________________  LABS (pertinent positives/negatives)  Comprehensive metabolic panel significant for BUN 25 and creatinine 2.04. Potassium 2.8. Glucose 198 White blood count 6.7, hemoglobin 13.2 and  platelet count 148 Type and screen sent Troponin pending  ____________________________________________  RADIOLOGY All Xrays were viewed by me. Imaging interpreted by Radiologist.  None __________________________________________  PROCEDURES  Procedure(s) performed: None  Critical Care performed: None  ____________________________________________   ED COURSE / ASSESSMENT AND  PLAN  CONSULTATIONS: Hospitalist for admission  Pertinent labs & imaging results that were available during my care of the patient were reviewed by me and considered in my medical decision making (see chart for details).   Presenting with what appears to be lower GI bleeding. He is clinically stable with no hypotension or tachycardia. Patient does have frequent PVCs on his EKG. He does consent to blood transfusion if needed during his hospital stay.  Potassium replacement given for hypokalemia.  He did have an additional dark bloody bowel movement here in the emergency department. Continues to have stable vital signs. Initial hemoglobin is 13.    Patient / Family / Caregiver informed of clinical course, medical decision-making process, and agree with plan.    ___________________________________________   FINAL CLINICAL IMPRESSION(S) / ED DIAGNOSES   Final diagnoses:  Rectal bleeding       Lisa Roca, MD 06/03/15 1149

## 2015-06-04 ENCOUNTER — Inpatient Hospital Stay
Admit: 2015-06-04 | Discharge: 2015-06-04 | Disposition: A | Discharge status: Home / Self Care | Payer: Medicare Other | Attending: Cardiovascular Disease | Admitting: Cardiovascular Disease

## 2015-06-04 LAB — BASIC METABOLIC PANEL
Anion gap: 8 (ref 5–15)
BUN: 25 mg/dL — AB (ref 6–20)
CALCIUM: 7.9 mg/dL — AB (ref 8.9–10.3)
CHLORIDE: 107 mmol/L (ref 101–111)
CO2: 27 mmol/L (ref 22–32)
CREATININE: 1.94 mg/dL — AB (ref 0.61–1.24)
GFR calc Af Amer: 34 mL/min — ABNORMAL LOW (ref >60)
GFR calc non Af Amer: 30 mL/min — ABNORMAL LOW (ref >60)
GLUCOSE: 130 mg/dL — AB (ref 65–99)
Potassium: 3.2 mmol/L — ABNORMAL LOW (ref 3.5–5.1)
Sodium: 142 mmol/L (ref 135–145)

## 2015-06-04 LAB — CBC
HCT: 31.6 % — ABNORMAL LOW (ref 40.0–52.0)
Hemoglobin: 10.6 g/dL — ABNORMAL LOW (ref 13.0–18.0)
MCH: 29.7 pg (ref 26.0–34.0)
MCHC: 33.7 g/dL (ref 32.0–36.0)
MCV: 88.3 fL (ref 80.0–100.0)
PLATELETS: 179 10*3/uL (ref 150–440)
RBC: 3.58 MIL/uL — ABNORMAL LOW (ref 4.40–5.90)
RDW: 15 % — AB (ref 11.5–14.5)
WBC: 7.5 10*3/uL (ref 3.8–10.6)

## 2015-06-04 LAB — HEMOGLOBIN AND HEMATOCRIT, BLOOD
HCT: 30.1 % — ABNORMAL LOW (ref 40.0–52.0)
HEMATOCRIT: 32.8 % — AB (ref 40.0–52.0)
HEMOGLOBIN: 10.1 g/dL — AB (ref 13.0–18.0)
Hemoglobin: 10.9 g/dL — ABNORMAL LOW (ref 13.0–18.0)

## 2015-06-04 LAB — GLUCOSE, CAPILLARY
GLUCOSE-CAPILLARY: 106 mg/dL — AB (ref 65–99)
Glucose-Capillary: 121 mg/dL — ABNORMAL HIGH (ref 65–99)
Glucose-Capillary: 110 mg/dL — ABNORMAL HIGH (ref 65–99)

## 2015-06-04 LAB — MAGNESIUM: MAGNESIUM: 1.9 mg/dL (ref 1.7–2.4)

## 2015-06-04 MED ORDER — LORAZEPAM 2 MG/ML IJ SOLN
1.0000 mg | Freq: Four times a day (QID) | INTRAMUSCULAR | Status: DC | PRN
  Administered 2015-06-04: 1 mg via INTRAVENOUS
  Filled 2015-06-04 (×2): qty 1

## 2015-06-04 MED ORDER — LORAZEPAM 2 MG/ML IJ SOLN
1.0000 mg | Freq: Once | INTRAMUSCULAR | Status: DC
  Filled 2015-06-04: qty 1

## 2015-06-04 MED ORDER — LORAZEPAM 2 MG/ML IJ SOLN
1.0000 mg | Freq: Four times a day (QID) | INTRAMUSCULAR | Status: DC | PRN
  Administered 2015-06-04: 1 mg via INTRAMUSCULAR

## 2015-06-04 MED ORDER — HYDRALAZINE HCL 20 MG/ML IJ SOLN: 10.0000 mg | Freq: Four times a day (QID) | INTRAMUSCULAR | Status: DC | PRN

## 2015-06-04 NOTE — Progress Notes (Signed)
Notified attending of patients blood pressure at 178/75 and that patient appears anxious. Orders obtained see mar

## 2015-06-04 NOTE — Consult Note (Signed)
[name]  MRN : [id number]  [name] is a 81 y.o. (September 11, 1926) male who presents with chief complaint of  Chief Complaint  Patient presents with  . GI Bleeding   History of Present Illness:  [name] is an 81 year old male is who after two episodes of bright red blood per rectum (the night before and another large bloody BM the following morning) sought medical attention at [facility] ER. Patient does take Aspirin and Plavix (patient states he takes for stents that have been placed in his heart). He was and still continues to be asymptomatic denying any abdominal pain, rectal pain, dizziness or lightheadedness. No chest pain. No trouble breathing or shortness of breath. No history of rectal bleeding in the past. Known history of coronary artery disease and prostate cancer.  The patient underwent a bleeding scan yesterday which was positive for bleeding in the splenic flexure. He was transfused one unit of platelets. He underwent serial H&H which are now stable this AM. Last Hbg: 10.9  Patient is very unhappy to be admitted - states he is only staying one more day before he leaves. I explained why Vascular Surgery was consulted. We discussed how if he does not remain stable and would continue to bleed the need for embolization. I explained the procedure, risks and benefits. At this point, the patient is refusing embolization if he would start to bleed. I also explained the risks of him refusing embolization if needed.   Current Facility-Administered Medications  Medication Dose Route Frequency Provider Last Rate Last Dose  . 0.9 %  sodium chloride infusion   Intravenous Continuous Bettey Costa, MD 50 mL/hr at 06/03/15 2021    . acetaminophen (TYLENOL) tablet 650 mg  650 mg Oral Q6H PRN Bettey Costa, MD       Or  . acetaminophen (TYLENOL) suppository 650 mg  650 mg Rectal Q6H PRN Sital Mody, MD      . albuterol (PROVENTIL) (2.5 MG/3ML) 0.083%  nebulizer solution 2.5 mg  2.5 mg Nebulization Q6H PRN Lenis Noon, RPH      . alum & mag hydroxide-simeth (MAALOX/MYLANTA) 200-200-20 MG/5ML suspension 30 mL  30 mL Oral Q6H PRN Bettey Costa, MD      . atorvastatin (LIPITOR) tablet 40 mg  40 mg Oral Daily Bettey Costa, MD   40 mg at 06/04/15 0933  . doxazosin (CARDURA) tablet 8 mg  8 mg Oral Daily Bettey Costa, MD   8 mg at 06/04/15 0934  . ferrous sulfate tablet 325 mg  325 mg Oral Q breakfast Bettey Costa, MD   325 mg at 06/04/15 0935  . hydrALAZINE (APRESOLINE) tablet 25 mg  25 mg Oral BID Bettey Costa, MD   25 mg at 06/04/15 0936  . insulin aspart (novoLOG) injection 0-5 Units  0-5 Units Subcutaneous QHS Bettey Costa, MD   0 Units at 06/03/15 2123  . insulin aspart (novoLOG) injection 0-9 Units  0-9 Units Subcutaneous TID WC Sital Mody, MD      . levothyroxine (SYNTHROID, LEVOTHROID) tablet 100 mcg  100 mcg Oral Daily Bettey Costa, MD   100 mcg at 06/04/15 0935  . losartan (COZAAR) tablet 100 mg  100 mg Oral Daily Bettey Costa, MD   100 mg at 06/04/15 0934  . metoprolol succinate (TOPROL-XL) 24 hr tablet 50 mg  50 mg Oral Daily Bettey Costa, MD   50 mg at 06/04/15 0936  . nitroGLYCERIN (NITROSTAT) SL tablet 0.4  mg  0.4 mg Sublingual Q5 min PRN Bettey Costa, MD      . omega-3 acid ethyl esters (LOVAZA) capsule 1 g  1 capsule Oral BID Bettey Costa, MD   1 g at 06/04/15 0933  . ondansetron (ZOFRAN) tablet 4 mg  4 mg Oral Q6H PRN Bettey Costa, MD       Or  . ondansetron (ZOFRAN) injection 4 mg  4 mg Intravenous Q6H PRN Sital Mody, MD      . pantoprazole (PROTONIX) injection 40 mg  40 mg Intravenous Q12H Bettey Costa, MD   40 mg at 06/04/15 0941  . potassium chloride (K-DUR) CR tablet 10 mEq  10 mEq Oral Daily Bettey Costa, MD   10 mEq at 06/04/15 0933  . senna-docusate (Senokot-S) tablet 1 tablet  1 tablet Oral QHS PRN Bettey Costa, MD      . sodium chloride 0.9 % injection 3 mL  3 mL Intravenous Q12H Bettey Costa, MD   3 mL at 06/04/15 0946  . torsemide (DEMADEX) tablet 20  mg  20 mg Oral Daily Bettey Costa, MD   20 mg at 06/04/15 0935    Past Medical History  Diagnosis Date  . Prostate cancer ([place])   . Skin cancer     right cheek  . Hypercholesteremia   . Cardiac disease   . Shingles   . Kidney failure     left  . Hypertension   . Osteoporosis   . CHF (congestive heart failure) [facility])     Past Surgical History  Procedure Laterality Date  . Cardiac catheterization  04/13/2014   Social History Social History  Substance Use Topics  . Smoking status: Former Smoker    Types: Cigarettes, Cigars  . Smokeless tobacco: Current User    Types: Chew  . Alcohol Use: No   Family History Patient denies any bleeding or clotting disorders. Denies any PVD or PAD.  Patient does have a positive family history of HTN and CAD.  Allergies  Allergen Reactions  . Penicillin G Other (See Comments)    Has patient had a PCN reaction causing immediate rash, facial/tongue/throat swelling, SOB or lightheadedness with hypotension: Yes Has patient had a PCN reaction causing severe rash involving mucus membranes or skin necrosis: No Has patient had a PCN reaction that required hospitalization No Has patient had a PCN reaction occurring within the last 10 years: No If all of the above answers are "NO", then may proceed with Cephalosporin use.    REVIEW OF SYSTEMS (Negative unless checked)  Constitutional: [] Weight loss  [] Fever  [] Chills Cardiac: [] Chest pain   [] Chest pressure   [] Palpitations   [] Shortness of breath when laying flat   [] Shortness of breath at rest   [] Shortness of breath with exertion. Vascular:  [] Pain in legs with walking   [] Pain in legs at rest   [] Pain in legs when laying flat   [] Claudication   [] Pain in feet when walking  [] Pain in feet at rest  [] Pain in feet when laying flat   [] History of DVT   [] Phlebitis   [] Swelling in legs   [] Varicose veins   [] Non-healing ulcers Pulmonary:   [] Uses home oxygen   [] Productive cough   [] Hemoptysis   [] Wheeze   [] COPD   [] Asthma Neurologic:  [] Dizziness  [] Blackouts   [] Seizures   [] History of stroke   [] History of TIA  [] Aphasia   [] Temporary blindness   [] Dysphagia   [] Weakness or numbness in arms   [] Weakness  or numbness in legs Musculoskeletal:  [] Arthritis   [] Joint swelling   [] Joint pain   [] Low back pain Hematologic:  [] Easy bruising  [] Easy bleeding   [] Hypercoagulable state   [] Anemic  [] Hepatitis Gastrointestinal:  [x] Blood in stool   [] Vomiting blood  [x] Gastroesophageal reflux/heartburn   [] Difficulty swallowing. Genitourinary:  [x] Chronic kidney disease   [] Difficult urination  [] Frequent urination  [] Burning with urination   [] Blood in urine Skin:  [] Rashes   [] Ulcers   [] Wounds Psychological:  [] History of anxiety   []  History of major depression.  Physical Examination  Filed Vitals:   06/04/15 0043 06/04/15 0107 06/04/15 0312 06/04/15 0614  BP: 162/68 165/69 158/55 144/76  Pulse: 62 64 61 71  Temp: 97.7 F (36.5 C) 98.6 F (37 C) 98.4 F (36.9 C) 98.3 F (36.8 C)  TempSrc: Oral Oral Oral   Resp: 18 18 18 18   Height:      Weight:      SpO2: 95% 94% 95% 93%   Body mass index is 28.87 kg/(m^2).   Gen:  WD/WN, NAD Head: [place]/AT, No temporalis wasting. Prominent temp pulse not noted. Ear/Nose/Throat: Hearing grossly intact, nares w/o erythema or drainage, oropharynx w/o Erythema/Exudate Eyes: PERRLA, EOMI.  Neck: Supple, no nuchal rigidity.  No bruit or JVD.  Pulmonary:  Good air movement, clear to auscultation bilaterally.  Cardiac: RRR, normal S1, S2, no Murmurs, rubs or gallops. Vascular:  Vessel Right Left  Radial Palpable Palpable  Ulnar Palpable Palpable  Brachial Palpable Palpable  Carotid Palpable, without bruit Palpable, without bruit  Aorta Not palpable N/A  Femoral Palpable Palpable  Popliteal Palpable Palpable  PT Palpable Palpable  DP Palpable Palpable   Gastrointestinal: soft, non-tender, mildly distended. No guarding/reflex. No masses, surgical  incisions, or scars. Musculoskeletal: M/S 5/5 throughout.  Extremities without ischemic changes.  No deformity or atrophy. No edema. Neurologic: CN 2-12 intact. Pain and light touch intact in extremities.  Symmetrical.  Speech is fluent. Motor exam as listed above. Dermatologic: No rashes or ulcers noted.  No cellulitis or open wounds. Lymph : No Cervical, Axillary, or Inguinal lymphadenopathy.  CBC Lab Results  Component Value Date   WBC 7.5 06/04/2015   HGB 10.9* 06/04/2015   HCT 32.8* 06/04/2015   MCV 88.3 06/04/2015   PLT 179 06/04/2015   BMET    Component Value Date/Time   NA 142 06/04/2015 0332   NA 142 03/07/2012 0520   K 3.2* 06/04/2015 0332   K 3.4* 03/07/2012 1551   CL 107 06/04/2015 0332   CL 105 03/07/2012 0520   CO2 27 06/04/2015 0332   CO2 27 03/07/2012 0520   GLUCOSE 130* 06/04/2015 0332   GLUCOSE 128* 03/07/2012 0520   BUN 25* 06/04/2015 0332   BUN 14 03/07/2012 0520   CREATININE 1.94* 06/04/2015 0332   CREATININE 1.81* 03/07/2012 0520   CALCIUM 7.9* 06/04/2015 0332   CALCIUM 8.2* 03/07/2012 0520   GFRNONAA 30* 06/04/2015 0332   GFRNONAA 34* 03/07/2012 0520   GFRAA 34* 06/04/2015 0332   GFRAA 39* 03/07/2012 0520   Estimated Creatinine Clearance: 27.3 mL/min (by C-G formula based on Cr of 1.94).  COAG Lab Results  Component Value Date   INR 1.01 12/01/2010   Radiology Nm Gi Blood Loss  06/03/2015  CLINICAL DATA:  Gastrointestinal bleeding since 4 a.m. on 06/03/2015 EXAM: NUCLEAR MEDICINE GASTROINTESTINAL BLEEDING SCAN TECHNIQUE: Sequential abdominal images were obtained following intravenous administration of Tc-96m labeled red blood cells. RADIOPHARMACEUTICALS:  19.06 mCi Tc-71m in-vitro labeled red  cells. COMPARISON:  None. FINDINGS: Approximately 45 minutes into the examination, there is uptake in the left upper quadrant. This appears to subsequently follow the anticipated course of the large bowel, with continued multi-episodic appearance of uptake  in the left upper quadrant following this pattern. There is eventual accumulation in the mid-pelvis, just to the right of midline in the anticipated position of the sigmoid colon. IMPRESSION: This study appears to demonstrate active hemorrhage in the large bowellikely in the distal transverse colon, near the splenic flexure. Electronically Signed   By: Skipper Cliche M.D.   On: 06/03/2015 19:50   Assessment/Plan The patient is a 81 year old male admitted for bright red blood per rectum. Positive bleeding scan near splenic flexure. Transfused one unit platelets. Serial H&H's are now stable.   1) H&H stable at 10.9, patient asymptomatic, no further bleeding in stool - no embolization at this point indicated. If the patients H&H doesn't hold stable would consider going forward with embolization - however after a long discussion about the procedure, risks and benefits the patient is refusing. We also discussed the risks of him refusing the procedure.   2) Wound restart ASA and Plavix in 48-72 hours due to high risk of a thrombolytic event associated with his cardiac stents.  3) Continue medical management as per his primary team  4) Nicotine dependence: Patient is encouraged to stop using his smokeless tobacco. Patient is not interested in quitting.  5) Discussed with Dr. Mayme Genta, PA-C  06/04/2015 10:59 AM

## 2015-06-04 NOTE — Progress Notes (Signed)
Patient's vss 15 minutes after platelet transfusion started. Rate changed to 195 mL/hr. Educated patient on importance of notifying RN of any changes. Nursing staff will continue to monitor. Earleen Reaper, RN

## 2015-06-04 NOTE — Consult Note (Signed)
  GI Inpatient Follow-up Note  Patient Identification: [name] is a 81 y.o. male  Subjective: Covering for Dr. Rayann Heman. No further bleeding since yesterday AM. Positive bleeding scan. Recalls having colonoscopy 2-3 years ago with Dr. Phylis Bougie. No abdominal pain. Tolerating liquid diet. Plavix put on hold.  Scheduled Inpatient Medications:  . atorvastatin  40 mg Oral Daily  . doxazosin  8 mg Oral Daily  . ferrous sulfate  325 mg Oral Q breakfast  . hydrALAZINE  25 mg Oral BID  . insulin aspart  0-5 Units Subcutaneous QHS  . insulin aspart  0-9 Units Subcutaneous TID WC  . levothyroxine  100 mcg Oral Daily  . losartan  100 mg Oral Daily  . metoprolol succinate  50 mg Oral Daily  . omega-3 acid ethyl esters  1 capsule Oral BID  . pantoprazole (PROTONIX) IV  40 mg Intravenous Q12H  . potassium chloride  10 mEq Oral Daily  . sodium chloride  3 mL Intravenous Q12H  . torsemide  20 mg Oral Daily    Continuous Inpatient Infusions:   . sodium chloride 50 mL/hr at 06/03/15 2021    PRN Inpatient Medications:  acetaminophen **OR** acetaminophen, albuterol, alum & mag hydroxide-simeth, hydrALAZINE, LORazepam, nitroGLYCERIN, ondansetron **OR** ondansetron (ZOFRAN) IV, senna-docusate  Review of Systems: Constitutional: Weight is stable.  Eyes: No changes in vision. ENT: No oral lesions, sore throat.  GI: see HPI.  Heme/Lymph: No easy bruising.  CV: No chest pain.  GU: No hematuria.  Integumentary: No rashes.  Neuro: No headaches.  Psych: No depression/anxiety.  Endocrine: No heat/cold intolerance.  Allergic/Immunologic: No urticaria.  Resp: No cough, SOB.  Musculoskeletal: No joint swelling.    Physical Examination: BP 169/61 mmHg  Pulse 80  Temp(Src) 98.3 F (36.8 C) (Oral)  Resp 20  Ht 5\' 6"  (1.676 m)  Wt 81.103 kg (178 lb 12.8 oz)  BMI 28.87 kg/m2  SpO2 95% Gen: NAD, alert and oriented x 4 HEENT: PEERLA, EOMI, Neck: supple, no JVD or thyromegaly Chest: CTA  bilaterally, no wheezes, crackles, or other adventitious sounds CV: RRR, no m/g/c/r Abd: soft, NT, ND, +BS in all four quadrants; no HSM, guarding, ridigity, or rebound tenderness Ext: no edema, well perfused with 2+ pulses, Skin: no rash or lesions noted Lymph: no LAD  Data: Lab Results  Component Value Date   WBC 7.5 06/04/2015   HGB 10.9* 06/04/2015   HCT 32.8* 06/04/2015   MCV 88.3 06/04/2015   PLT 179 06/04/2015    Recent Labs Lab 06/03/15 2109 06/04/15 0332 06/04/15 1000  HGB 11.6* 10.6* 10.9*   Lab Results  Component Value Date   NA 142 06/04/2015   K 3.2* 06/04/2015   CL 107 06/04/2015   CO2 27 06/04/2015   BUN 25* 06/04/2015   CREATININE 1.94* 06/04/2015   Lab Results  Component Value Date   ALT 19 06/03/2015   AST 24 06/03/2015   ALKPHOS 59 06/03/2015   BILITOT 0.6 06/03/2015   No results for input(s): APTT, INR, PTT in the last 168 hours. Assessment/Plan: Mr. Stills is a 81 y.o. male with LGI bleeding, likely diverticular. Positive bleeding scan but no bleeding since yesterday.  Recommendations: Agree with advancing diet. Continue to moniter for active bleeding. Contact vascular surgery again if active bleeding. Dr. Rayann Heman will be back on Monday. Contact GI on call if bleeding recurs. Thanks. Please call with questions or concerns.  Aurelie Dicenzo, Lupita Dawn, MD

## 2015-06-04 NOTE — Progress Notes (Signed)
Patient very agitated, combative, and refusing treatment. Security called to assist MD notified and given orders for 1mg  IV Ativan. Unable to administer as both IV accesses were lost due to patient pulling on them. MD notified again and given order for 1mg  IM Ativan. Dose administered into R deltoid and wife currently at bedside Security left once Ativan was given. Nursing staff will continue to monitor. Earleen Reaper, RN

## 2015-06-04 NOTE — Progress Notes (Addendum)
[place] at [place] NAME: [name]    MR#:  [id number]  DATE OF BIRTH:  04-Apr-1927  SUBJECTIVE:  CHIEF COMPLAINT:   Chief Complaint  Patient presents with  . GI Bleeding  wants to go home  REVIEW OF SYSTEMS:  Review of Systems  Constitutional: Negative for fever, weight loss, malaise/fatigue and diaphoresis.  HENT: Negative for ear discharge, ear pain, hearing loss, nosebleeds, sore throat and tinnitus.   Eyes: Negative for blurred vision and pain.  Respiratory: Negative for cough, hemoptysis, shortness of breath and wheezing.   Cardiovascular: Negative for chest pain, palpitations, orthopnea and leg swelling.  Gastrointestinal: Negative for heartburn, nausea, vomiting, abdominal pain, diarrhea, constipation and blood in stool.  Genitourinary: Negative for dysuria, urgency and frequency.  Musculoskeletal: Negative for myalgias and back pain.  Skin: Negative for itching and rash.  Neurological: Negative for dizziness, tingling, tremors, focal weakness, seizures, weakness and headaches.  Psychiatric/Behavioral: Negative for depression. The patient is not nervous/anxious.     DRUG ALLERGIES:   Allergies  Allergen Reactions  . Penicillin G Other (See Comments)    Has patient had a PCN reaction causing immediate rash, facial/tongue/throat swelling, SOB or lightheadedness with hypotension: Yes Has patient had a PCN reaction causing severe rash involving mucus membranes or skin necrosis: No Has patient had a PCN reaction that required hospitalization No Has patient had a PCN reaction occurring within the last 10 years: No If all of the above answers are "NO", then may proceed with Cephalosporin use.    VITALS:  Blood pressure 144/76, pulse 71, temperature 98.3 F (36.8 C), temperature source Oral, resp. rate 18, height 5\' 6"  (1.676 m), weight 81.103 kg (178 lb 12.8 oz), SpO2 93 %. PHYSICAL EXAMINATION:  Physical Exam   Constitutional: He is oriented to person, place, and time and well-developed, well-nourished, and in no distress.  HENT:  Head: Normocephalic and atraumatic.  Eyes: Conjunctivae and EOM are normal. Pupils are equal, round, and reactive to light.  Neck: Normal range of motion. Neck supple. No tracheal deviation present. No thyromegaly present.  Cardiovascular: Normal rate, regular rhythm and normal heart sounds.   Pulmonary/Chest: Effort normal and breath sounds normal. No respiratory distress. He has no wheezes. He exhibits no tenderness.  Abdominal: Soft. Bowel sounds are normal. He exhibits no distension. There is no tenderness.  Musculoskeletal: Normal range of motion.  Neurological: He is alert and oriented to person, place, and time. No cranial nerve deficit.  Skin: Skin is warm and dry. No rash noted.  Psychiatric: Mood and affect normal.   LABORATORY PANEL:   CBC  Recent Labs Lab 06/04/15 0332  WBC 7.5  HGB 10.6*  HCT 31.6*  PLT 179   ------------------------------------------------------------------------------------------------------------------ Chemistries   Recent Labs Lab 06/03/15 1019  06/04/15 0332  NA 140  --  142  K 2.8*  --  3.2*  CL 106  --  107  CO2 23  --  27  GLUCOSE 198*  --  130*  BUN 25*  --  25*  CREATININE 2.04*  --  1.94*  CALCIUM 8.3*  --  7.9*  MG  --   < > 1.9  AST 24  --   --   ALT 19  --   --   ALKPHOS 59  --   --   BILITOT 0.6  --   --   < > = values in this interval not displayed. RADIOLOGY:  Nm Gi  Blood Loss  06/03/2015  CLINICAL DATA:  Gastrointestinal bleeding since 4 a.m. on 06/03/2015 EXAM: NUCLEAR MEDICINE GASTROINTESTINAL BLEEDING SCAN TECHNIQUE: Sequential abdominal images were obtained following intravenous administration of Tc-70m labeled red blood cells. RADIOPHARMACEUTICALS:  19.06 mCi Tc-82m in-vitro labeled red cells. COMPARISON:  None. FINDINGS: Approximately 45 minutes into the examination, there is uptake in the  left upper quadrant. This appears to subsequently follow the anticipated course of the large bowel, with continued multi-episodic appearance of uptake in the left upper quadrant following this pattern. There is eventual accumulation in the mid-pelvis, just to the right of midline in the anticipated position of the sigmoid colon. IMPRESSION: This study appears to demonstrate active hemorrhage in the large bowellikely in the distal transverse colon, near the splenic flexure. Electronically Signed   By: Skipper Cliche M.D.   On: 06/03/2015 19:50   ASSESSMENT AND PLAN:  81 year old male presents with bright red blood per rectum.  1. Bright red blood per rectum/GI bleed: Had a positive bleeding scan. hemoglobin and hematocrit check every 8 hours. GI seen. continue on Protonix 40 IV twice a day. vascular surgery would like to monitor hemodynamic status and if bleeding further.  May require embolization.  Hemoglobin 11.6 -> 10.6. - Patient was very adamant wanting to go home and after long discussion, he is agreeable to stay here for time being at least another 24 hours.  Wife at bedside in agreement.  Was given 1 unit of platelet transfusion yesterday per vascular surgery recommendation   2. CAD: Patient reports he has stents in his heart and he is on Plavix and aspirin. Both of these medications are being held due to problem #1.  3. Chronic kidney disease stage III: It appears as base and creatinine is 1.9-2.0. His creatinine appears to be at baseline this time. Continue to monitor his creatinine. Patient would best benefit from outpatient nephrology follow-up.  4. Hypothyroidism: Continue Synthroid  5. Essential hypertension: Continue hydralazine, metoprolol, torsemide and losartan.  6. Nicotine dependence: Patient is encouraged to stop using his smokeless tobacco. Patient is counseled for 3 minutes regarding this. Patient is not interested in quitting.     All the records are reviewed and case  discussed with Care Management/Social Worker. Management plans discussed with the patient, family and they are in agreement.  CODE STATUS: Full code  TOTAL TIME TAKING CARE OF THIS PATIENT: 35 minutes.   More than 50% of the time was spent in counseling/coordination of care: YES (discussed with wife at bedside)  POSSIBLE D/C IN 1-2 DAYS, DEPENDING ON CLINICAL CONDITION. Hopefully He remains hemodynamically stable and no further bleeding as he really wants to go home.    [facility], Yarnell Kozloski M.D on 06/04/2015 at 10:01 AM  Between 7am to 6pm - Pager - [phone]  After 6pm go to [email or web address] - password [email or web address] [facility]  Office  [phone]  CC:  Primary care physician; Lorelee Market, MD

## 2015-06-04 NOTE — Progress Notes (Signed)
Platelet transfusion completed at 0310. VS still stable and no signs of discomfort or distress noted. Nursing staff will continue to monitor. Earleen Reaper, RN

## 2015-06-04 NOTE — Progress Notes (Signed)
Platelet transfusion verified and started with Yasmin RN. VS stable and no signs of discomfort or distress noted. Nursing staff will continue to monitor. Earleen Reaper, RN

## 2015-06-04 NOTE — Progress Notes (Signed)
*  PRELIMINARY RESULTS* Echocardiogram 2D Echocardiogram has been performed.  [name] 06/04/2015, 9:13 AM

## 2015-06-05 DIAGNOSIS — K922 Gastrointestinal hemorrhage, unspecified: Secondary | ICD-10-CM

## 2015-06-05 LAB — BASIC METABOLIC PANEL
ANION GAP: 6 (ref 5–15)
BUN: 25 mg/dL — AB (ref 6–20)
CO2: 26 mmol/L (ref 22–32)
Calcium: 7.9 mg/dL — ABNORMAL LOW (ref 8.9–10.3)
Chloride: 109 mmol/L (ref 101–111)
Creatinine, Ser: 2.16 mg/dL — ABNORMAL HIGH (ref 0.61–1.24)
GFR calc Af Amer: 30 mL/min — ABNORMAL LOW (ref >60)
GFR, EST NON AFRICAN AMERICAN: 26 mL/min — AB (ref >60)
Glucose, Bld: 118 mg/dL — ABNORMAL HIGH (ref 65–99)
POTASSIUM: 3 mmol/L — AB (ref 3.5–5.1)
SODIUM: 141 mmol/L (ref 135–145)

## 2015-06-05 LAB — CBC
HCT: 29.1 % — ABNORMAL LOW (ref 40.0–52.0)
Hemoglobin: 9.7 g/dL — ABNORMAL LOW (ref 13.0–18.0)
MCH: 29.7 pg (ref 26.0–34.0)
MCHC: 33.4 g/dL (ref 32.0–36.0)
MCV: 89 fL (ref 80.0–100.0)
PLATELETS: 163 10*3/uL (ref 150–440)
RBC: 3.26 MIL/uL — AB (ref 4.40–5.90)
RDW: 14.8 % — ABNORMAL HIGH (ref 11.5–14.5)
WBC: 4.8 10*3/uL (ref 3.8–10.6)

## 2015-06-05 LAB — GLUCOSE, CAPILLARY
GLUCOSE-CAPILLARY: 150 mg/dL — AB (ref 65–99)
Glucose-Capillary: 106 mg/dL — ABNORMAL HIGH (ref 65–99)
Glucose-Capillary: 110 mg/dL — ABNORMAL HIGH (ref 65–99)

## 2015-06-05 LAB — HEMOGLOBIN AND HEMATOCRIT, BLOOD
HCT: 30.8 % — ABNORMAL LOW (ref 40.0–52.0)
HEMATOCRIT: 29.7 % — AB (ref 40.0–52.0)
HEMOGLOBIN: 10.4 g/dL — AB (ref 13.0–18.0)
HEMOGLOBIN: 10 g/dL — AB (ref 13.0–18.0)

## 2015-06-05 LAB — PREPARE PLATELET PHERESIS: Unit division: 0

## 2015-06-05 LAB — MAGNESIUM
MAGNESIUM: 2 mg/dL (ref 1.7–2.4)
Magnesium: 1.9 mg/dL (ref 1.7–2.4)

## 2015-06-05 MED ORDER — POTASSIUM CHLORIDE CRYS ER 20 MEQ PO TBCR
40.0000 meq | EXTENDED_RELEASE_TABLET | Freq: Once | ORAL | Status: AC
  Administered 2015-06-05: 40 meq via ORAL
  Filled 2015-06-05: qty 2

## 2015-06-05 NOTE — Progress Notes (Signed)
Subjective: Patient has had no further GI bleeding overnight. The patient has been tolerating by mouth's. He also denies any abdominal pain. The patient's hemoglobin has been stable and is up from yesterday.   Objective: Vital signs in last 24 hours: Filed Vitals:   06/04/15 1159 06/04/15 1600 06/04/15 2039 06/05/15 0642  BP: 169/61 147/62 166/73 160/83  Pulse:  74 69 75  Temp:   97.7 F (36.5 C) 99.1 F (37.3 C)  TempSrc:   Oral Oral  Resp:  16 22 22   Height:      Weight:      SpO2:  97% 94% 92%   Weight change:   Intake/Output Summary (Last 24 hours) at 06/05/15 1132 Last data filed at 06/05/15 [id number]  Gross per 24 hour  Intake   1370 ml  Output    600 ml  Net    770 ml     Exam: Regular rate and rhythm normal soft, nontender, normal bowel sounds   Lab Results: @LABTEST2 @ Micro Results: No results found for this or any previous visit (from the past 240 hour(s)). Studies/Results: Nm Gi Blood Loss  06/03/2015  CLINICAL DATA:  Gastrointestinal bleeding since 4 a.m. on 06/03/2015 EXAM: NUCLEAR MEDICINE GASTROINTESTINAL BLEEDING SCAN TECHNIQUE: Sequential abdominal images were obtained following intravenous administration of Tc-16m labeled red blood cells. RADIOPHARMACEUTICALS:  19.06 mCi Tc-85m in-vitro labeled red cells. COMPARISON:  None. FINDINGS: Approximately 45 minutes into the examination, there is uptake in the left upper quadrant. This appears to subsequently follow the anticipated course of the large bowel, with continued multi-episodic appearance of uptake in the left upper quadrant following this pattern. There is eventual accumulation in the mid-pelvis, just to the right of midline in the anticipated position of the sigmoid colon. IMPRESSION: This study appears to demonstrate active hemorrhage in the large bowellikely in the distal transverse colon, near the splenic flexure. Electronically Signed   By: Skipper Cliche M.D.   On: 06/03/2015 19:50   Medications: I  have reviewed the patient's current medications. Scheduled Meds: . atorvastatin  40 mg Oral Daily  . doxazosin  8 mg Oral Daily  . ferrous sulfate  325 mg Oral Q breakfast  . hydrALAZINE  25 mg Oral BID  . insulin aspart  0-5 Units Subcutaneous QHS  . insulin aspart  0-9 Units Subcutaneous TID WC  . levothyroxine  100 mcg Oral Daily  . LORazepam  1 mg Intravenous Once  . losartan  100 mg Oral Daily  . metoprolol succinate  50 mg Oral Daily  . omega-3 acid ethyl esters  1 capsule Oral BID  . pantoprazole (PROTONIX) IV  40 mg Intravenous Q12H  . potassium chloride  10 mEq Oral Daily  . sodium chloride  3 mL Intravenous Q12H  . torsemide  20 mg Oral Daily   Continuous Infusions: . sodium chloride 50 mL/hr at 06/05/15 0050   PRN Meds:.acetaminophen **OR** acetaminophen, albuterol, alum & mag hydroxide-simeth, hydrALAZINE, nitroGLYCERIN, ondansetron **OR** ondansetron (ZOFRAN) IV, senna-docusate   Assessment: Active Problems:   GIB (gastrointestinal bleeding)    Plan: Continue to monitor the hemoglobin and hematocrit. The patient has no further sign of any GI bleeding. Dr.Rein will resume care of the patient tomorrow.   LOS: 2 days   Daren Allen Norris 06/05/2015, 11:32 AM

## 2015-06-05 NOTE — Progress Notes (Signed)
Spoke with Dr. Manuella Ghazi to make aware patient unable to tolerate ativan became combative with hallucinations. Per md discontinue prn ativan. Also made md aware patients current hgb is 9.7, patient continues to have active bleeding with stools. Stools have been smears to small however no recorded BM as of this time

## 2015-06-05 NOTE — Progress Notes (Addendum)
[place] at [place] NAME: [name]    MR#:  [id number]  DATE OF BIRTH:  Aug 19, 1926  SUBJECTIVE:  CHIEF COMPLAINT:   Chief Complaint  Patient presents with  . GI Bleeding  Per nursing he couldn't tolerate ativan & became combative with hallucinations. patient continues to have active bleeding with stools. Stools have been smears to small however no recorded BM as of this time. No major bleed reported. Hb 10.4  REVIEW OF SYSTEMS:  Review of Systems  Constitutional: Negative for fever, weight loss, malaise/fatigue and diaphoresis.  HENT: Negative for ear discharge, ear pain, hearing loss, nosebleeds, sore throat and tinnitus.   Eyes: Negative for blurred vision and pain.  Respiratory: Negative for cough, hemoptysis, shortness of breath and wheezing.   Cardiovascular: Negative for chest pain, palpitations, orthopnea and leg swelling.  Gastrointestinal: Negative for heartburn, nausea, vomiting, abdominal pain, diarrhea, constipation and blood in stool.  Genitourinary: Negative for dysuria, urgency and frequency.  Musculoskeletal: Negative for myalgias and back pain.  Skin: Negative for itching and rash.  Neurological: Negative for dizziness, tingling, tremors, focal weakness, seizures, weakness and headaches.  Psychiatric/Behavioral: Negative for depression. The patient is not nervous/anxious.    DRUG ALLERGIES:   Allergies  Allergen Reactions  . Ativan [Lorazepam] Other (See Comments)    Hallucinations, combative  . Penicillin G Other (See Comments)    Has patient had a PCN reaction causing immediate rash, facial/tongue/throat swelling, SOB or lightheadedness with hypotension: Yes Has patient had a PCN reaction causing severe rash involving mucus membranes or skin necrosis: No Has patient had a PCN reaction that required hospitalization No Has patient had a PCN reaction occurring within the last 10 years: No If all of the above  answers are "NO", then may proceed with Cephalosporin use.    VITALS:  Blood pressure 117/62, pulse 82, temperature 98.6 F (37 C), temperature source Oral, resp. rate 22, height 5\' 6"  (1.676 m), weight 81.103 kg (178 lb 12.8 oz), SpO2 95 %. PHYSICAL EXAMINATION:  Physical Exam  Constitutional: He is well-developed, well-nourished, and in no distress.  HENT:  Head: Normocephalic and atraumatic.  Eyes: Conjunctivae and EOM are normal. Pupils are equal, round, and reactive to light.  Neck: Normal range of motion. Neck supple. No tracheal deviation present. No thyromegaly present.  Cardiovascular: Normal rate, regular rhythm and normal heart sounds.   Pulmonary/Chest: Effort normal and breath sounds normal. No respiratory distress. He has no wheezes. He exhibits no tenderness.  Abdominal: Soft. Bowel sounds are normal. He exhibits no distension. There is no tenderness.  Musculoskeletal: Normal range of motion.  Neurological: He is alert. No cranial nerve deficit.  Skin: Skin is warm and dry. No rash noted.  Psychiatric: Mood and affect normal.   LABORATORY PANEL:   CBC  Recent Labs Lab 06/05/15 0154 06/05/15 1017  WBC 4.8  --   HGB 9.7* 10.4*  HCT 29.1* 30.8*  PLT 163  --    ------------------------------------------------------------------------------------------------------------------ Chemistries   Recent Labs Lab 06/03/15 1019  06/05/15 0154 06/05/15 1017  NA 140  < > 141  --   K 2.8*  < > 3.0*  --   CL 106  < > 109  --   CO2 23  < > 26  --   GLUCOSE 198*  < > 118*  --   BUN 25*  < > 25*  --   CREATININE 2.04*  < > 2.16*  --  CALCIUM 8.3*  < > 7.9*  --   MG  --   < > 2.0 1.9  AST 24  --   --   --   ALT 19  --   --   --   ALKPHOS 59  --   --   --   BILITOT 0.6  --   --   --   < > = values in this interval not displayed. RADIOLOGY:  No results found. ASSESSMENT AND PLAN:  81 year old male presents with bright red blood per rectum.  * Acute Delirium:  monitor closely, stop ativan.  1. Bright red blood per rectum/GI bleed: Had a positive bleeding scan. hemoglobin and hematocrit check every 8 hours. GI seen. continue on Protonix 40 IV twice a day. vascular surgery would like to monitor hemodynamic status and if bleeding further.  May require embolization.  Hemoglobin 11.6 -> 10.6 -> 9.7 -> 10.4. No major bleeding since admitted.  2. CAD: Patient reports he has stents in his heart and he is on Plavix and aspirin. Both of these medications are being held due to problem #1.  3. Chronic kidney disease stage III: Creat 2.16. It appears as base and creatinine is 1.9-2.0. His creatinine appears to be at baseline this time. Continue to monitor his creatinine. Patient would best benefit from outpatient nephrology follow-up.  4. Hypokalemia: replete and recheck.  5. Essential hypertension: Continue hydralazine, metoprolol, torsemide and losartan.  6. Hypothyroidism: Continue Synthroid  Nicotine dependence: Patient is encouraged to stop using his smokeless tobacco. Patient is counseled for 3 minutes regarding this. Patient is not interested in quitting.     All the records are reviewed and case discussed with Care Management/Social Worker. Management plans discussed with the patient, family and they are in agreement.  CODE STATUS: Full code  TOTAL TIME TAKING CARE OF THIS PATIENT: 35 minutes.   More than 50% of the time was spent in counseling/coordination of care: YES (discussed with wife at bedside)  POSSIBLE D/C IN AM, DEPENDING ON CLINICAL CONDITION. Hopefully He remains hemodynamically stable and no further bleeding as he really wants to go home.    [facility], Keaghan Bowens M.D on 06/05/2015 at 1:15 PM  Between 7am to 6pm - Pager - [phone]  After 6pm go to [email or web address] - password [email or web address] [place] [facility]  Office  [phone]  CC: Primary care physician; Lorelee Market, MD

## 2015-06-05 NOTE — Progress Notes (Signed)
New result for hgb posted, patients current hgb has improved now 10.4. Will continue to monitor closely

## 2015-06-06 LAB — MAGNESIUM: MAGNESIUM: 1.9 mg/dL (ref 1.7–2.4)

## 2015-06-06 LAB — HEMOGLOBIN AND HEMATOCRIT, BLOOD
HCT: 30.8 % — ABNORMAL LOW (ref 40.0–52.0)
HCT: 28.2 % — ABNORMAL LOW (ref 40.0–52.0)
HEMATOCRIT: 28.2 % — AB (ref 40.0–52.0)
HEMOGLOBIN: 9.3 g/dL — AB (ref 13.0–18.0)
Hemoglobin: 10.1 g/dL — ABNORMAL LOW (ref 13.0–18.0)
Hemoglobin: 9.4 g/dL — ABNORMAL LOW (ref 13.0–18.0)

## 2015-06-06 LAB — GLUCOSE, CAPILLARY
GLUCOSE-CAPILLARY: 135 mg/dL — AB (ref 65–99)
GLUCOSE-CAPILLARY: 158 mg/dL — AB (ref 65–99)
Glucose-Capillary: 136 mg/dL — ABNORMAL HIGH (ref 65–99)
Glucose-Capillary: 160 mg/dL — ABNORMAL HIGH (ref 65–99)

## 2015-06-06 NOTE — Progress Notes (Signed)
[place] at [place] NAME: [name]    MR#:  [id number]  DATE OF BIRTH:  31-Mar-1927  SUBJECTIVE:  CHIEF COMPLAINT:   Chief Complaint  Patient presents with  . GI Bleeding  Per nursing had some more bleed (fresh bright red), Hb 10.1 pt sitting in chair, family (wife, son and daughter in law) at bedside concerned about insulin  REVIEW OF SYSTEMS:  Review of Systems  Constitutional: Negative for fever, weight loss, malaise/fatigue and diaphoresis.  HENT: Negative for ear discharge, ear pain, hearing loss, nosebleeds, sore throat and tinnitus.   Eyes: Negative for blurred vision and pain.  Respiratory: Negative for cough, hemoptysis, shortness of breath and wheezing.   Cardiovascular: Negative for chest pain, palpitations, orthopnea and leg swelling.  Gastrointestinal: Negative for heartburn, nausea, vomiting, abdominal pain, diarrhea, constipation and blood in stool.  Genitourinary: Negative for dysuria, urgency and frequency.  Musculoskeletal: Negative for myalgias and back pain.  Skin: Negative for itching and rash.  Neurological: Negative for dizziness, tingling, tremors, focal weakness, seizures, weakness and headaches.  Psychiatric/Behavioral: Negative for depression. The patient is not nervous/anxious.    DRUG ALLERGIES:   Allergies  Allergen Reactions  . Ativan [Lorazepam] Other (See Comments)    Hallucinations, combative  . Penicillin G Other (See Comments)    Has patient had a PCN reaction causing immediate rash, facial/tongue/throat swelling, SOB or lightheadedness with hypotension: Yes Has patient had a PCN reaction causing severe rash involving mucus membranes or skin necrosis: No Has patient had a PCN reaction that required hospitalization No Has patient had a PCN reaction occurring within the last 10 years: No If all of the above answers are "NO", then may proceed with Cephalosporin use.    VITALS:  Blood  pressure 158/90, pulse 88, temperature 98.5 F (36.9 C), temperature source Oral, resp. rate 18, height 5\' 6"  (1.676 m), weight 81.103 kg (178 lb 12.8 oz), SpO2 93 %. PHYSICAL EXAMINATION:  Physical Exam  Constitutional: He is well-developed, well-nourished, and in no distress.  HENT:  Head: Normocephalic and atraumatic.  Eyes: Conjunctivae and EOM are normal. Pupils are equal, round, and reactive to light.  Neck: Normal range of motion. Neck supple. No tracheal deviation present. No thyromegaly present.  Cardiovascular: Normal rate, regular rhythm and normal heart sounds.   Pulmonary/Chest: Effort normal and breath sounds normal. No respiratory distress. He has no wheezes. He exhibits no tenderness.  Abdominal: Soft. Bowel sounds are normal. He exhibits no distension. There is no tenderness.  Musculoskeletal: Normal range of motion.  Neurological: He is alert. No cranial nerve deficit.  Skin: Skin is warm and dry. No rash noted.  Psychiatric: Mood and affect normal.   LABORATORY PANEL:   CBC  Recent Labs Lab 06/05/15 0154  06/06/15 0944  WBC 4.8  --   --   HGB 9.7*  < > 10.1*  HCT 29.1*  < > 30.8*  PLT 163  --   --   < > = values in this interval not displayed. ------------------------------------------------------------------------------------------------------------------ Chemistries   Recent Labs Lab 06/03/15 1019  06/05/15 0154  06/06/15 0122  NA 140  < > 141  --   --   K 2.8*  < > 3.0*  --   --   CL 106  < > 109  --   --   CO2 23  < > 26  --   --   GLUCOSE 198*  < > 118*  --   --  BUN 25*  < > 25*  --   --   CREATININE 2.04*  < > 2.16*  --   --   CALCIUM 8.3*  < > 7.9*  --   --   MG  --   < > 2.0  < > 1.9  AST 24  --   --   --   --   ALT 19  --   --   --   --   ALKPHOS 59  --   --   --   --   BILITOT 0.6  --   --   --   --   < > = values in this interval not displayed. RADIOLOGY:  No results found. ASSESSMENT AND PLAN:  81 year old male presents with  bright red blood per rectum.  * Acute Delirium: monitor closely, stop ativan seem to help. Will stop insulin (sliding scale) as family is concerned - please note patient is not diabetic.  1. Bright red blood per rectum/GI bleed: Had a positive bleeding scan. Monitor hemoglobin and hematocrit. GI seen. continue on Protonix 40 IV twice a day. vascular surgery would like to monitor hemodynamic status and if bleeding further.  May require embolization.  Hemoglobin 11.6 -> 10.6 -> 9.7 -> 10.4 ->10.0 -> 9.3 ->10.1. Some bleeding last night so will need to watch overnight.  2. CAD: Patient reports he has stents in his heart and he is on Plavix and aspirin. Both of these medications are being held due to problem #1.  3. Chronic kidney disease stage III: Creat 2.16. It appears as base and creatinine is 1.9-2.0. His creatinine appears to be at baseline this time. Continue to monitor his creatinine. Patient would best benefit from outpatient nephrology follow-up.  4. Hypokalemia: replete and recheck.   5. Essential hypertension: Continue hydralazine, metoprolol, torsemide and losartan.  6. Hypothyroidism: Continue Synthroid  Nicotine dependence: Patient is encouraged to stop using his smokeless tobacco. Patient is counseled for 3 minutes regarding this. Patient is not interested in quitting.     All the records are reviewed and case discussed with Care Management/Social Worker. Management plans discussed with the patient, family and they are in agreement.  CODE STATUS: Full code  TOTAL TIME TAKING CARE OF THIS PATIENT: 35 minutes.   More than 50% of the time was spent in counseling/coordination of care: YES (discussed with wife, son and daughter in law at bedside)  POSSIBLE D/C IN AM, DEPENDING ON CLINICAL CONDITION. Hopefully He remains hemodynamically stable and no further bleeding as he really wants to go home.    [facility], Doretta Remmert M.D on 06/06/2015 at 1:41 PM  Between 7am to 6pm - Pager -  [phone]  After 6pm go to [email or web address] - password [email or web address] [place] [facility]  Office  [phone]  CC: Primary care physician; Lorelee Market, MD

## 2015-06-06 NOTE — Progress Notes (Signed)
Pt has been alert but confused with no complaints of pain, VSS. MD made aware of family concern about pts confusion and deems it to be hospital delirium.However insulin stopped due to family concerns of new medication. Pt now resting quietly in bed with wife at bedside. Orders to tx pt with off unit tele. Pt will be tc to [facility] and report called to [place], Therapist, sports.

## 2015-06-06 NOTE — Progress Notes (Signed)
SUBJECTIVE: Pt sitting up comfortably eating breakfast, states he is feeling better. He denies CP, dizziness or palpitations and admits to mild SOB. HR noted to be in 30s overnight.    Filed Vitals:   06/05/15 1953 06/05/15 2053 06/06/15 0505 06/06/15 0506  BP: 144/64 162/50 166/102 143/74  Pulse: 69  54 97  Temp: 98.4 F (36.9 C)  98.4 F (36.9 C)   TempSrc: Oral     Resp: 19  23   Height:      Weight:      SpO2: 96%  93% 95%    Intake/Output Summary (Last 24 hours) at 06/06/15 0857 Last data filed at 06/06/15 0408  Gross per 24 hour  Intake   1335 ml  Output    775 ml  Net    560 ml    LABS: Basic Metabolic Panel:  Recent Labs  06/04/15 0332 06/05/15 0154 06/05/15 1017 06/06/15 0122  NA 142 141  --   --   K 3.2* 3.0*  --   --   CL 107 109  --   --   CO2 27 26  --   --   GLUCOSE 130* 118*  --   --   BUN 25* 25*  --   --   CREATININE 1.94* 2.16*  --   --   CALCIUM 7.9* 7.9*  --   --   MG 1.9 2.0 1.9 1.9   Liver Function Tests:  Recent Labs  06/03/15 1019  AST 24  ALT 19  ALKPHOS 59  BILITOT 0.6  PROT 6.2*  ALBUMIN 3.7   No results for input(s): LIPASE, AMYLASE in the last 72 hours. CBC:  Recent Labs  06/04/15 0332  06/05/15 0154  06/05/15 1713 06/06/15 0122  WBC 7.5  --  4.8  --   --   --   HGB 10.6*  < > 9.7*  < > 10.0* 9.3*  HCT 31.6*  < > 29.1*  < > 29.7* 28.2*  MCV 88.3  --  89.0  --   --   --   PLT 179  --  163  --   --   --   < > = values in this interval not displayed. Cardiac Enzymes:  Recent Labs  06/03/15 1019  TROPONINI <0.03   BNP: Invalid input(s): POCBNP D-Dimer: No results for input(s): DDIMER in the last 72 hours. Hemoglobin A1C: No results for input(s): HGBA1C in the last 72 hours. Fasting Lipid Panel: No results for input(s): CHOL, HDL, LDLCALC, TRIG, CHOLHDL, LDLDIRECT in the last 72 hours. Thyroid Function Tests: No results for input(s): TSH, T4TOTAL, T3FREE, THYROIDAB in the last 72 hours.  Invalid  input(s): FREET3 Anemia Panel: No results for input(s): VITAMINB12, FOLATE, FERRITIN, TIBC, IRON, RETICCTPCT in the last 72 hours.   PHYSICAL EXAM General: Well developed, well nourished, in no acute distress HEENT:  Normocephalic and atramatic Neck:  No JVD.  Lungs: Clear bilaterally to auscultation and percussion. Heart: HRRR . Normal S1 and S2 without gallops or murmurs.  Abdomen: Bowel sounds are positive, abdomen soft and non-tender  Msk:  Back normal, normal gait. Normal strength and tone for age. Extremities: No clubbing, cyanosis or edema.   Neuro: Alert and oriented X 3. Psych:  Good affect, responds appropriately  TELEMETRY: Reviewed telemetry pt in NSR with sinus bradycardia and PVCs, now NSR 80s  ASSESSMENT AND PLAN: 1. CAD: continue statin and restart asa with Hg will tolerate  2. Asymptomatic bradycardia:  HR now in 80s, will continue to monitor, can decrease metoprolol if bradycardia or pauses occur again.    Patient and plan discussed with supervising provider, Dr. Neoma Laming, who agrees with above findings.   Kelby Fam [place], [place]  06/06/2015 8:57 AM

## 2015-06-06 NOTE — Progress Notes (Signed)
Pt had an episode of bradycardia, heart rate dropped to 25 with 4 second pause. Pt was asymptomatic, reporting that he felt fine. MD willis notified. MD order that we continue to monitor. Pt's heart rate back up into the 60's. Will continue to monitor.

## 2015-06-06 NOTE — Care Management Important Message (Signed)
Important Message  Patient Details  Name: [name] MRN: [id number] Date of Birth: [birth year]   Medicare Important Message Given:  Yes    Juliann Pulse A Kaizlee Carlino 06/06/2015, 10:13 AM

## 2015-06-07 LAB — BASIC METABOLIC PANEL
ANION GAP: 7 (ref 5–15)
BUN: 21 mg/dL — AB (ref 6–20)
CALCIUM: 8 mg/dL — AB (ref 8.9–10.3)
CO2: 25 mmol/L (ref 22–32)
CREATININE: 2.01 mg/dL — AB (ref 0.61–1.24)
Chloride: 107 mmol/L (ref 101–111)
GFR calc Af Amer: 33 mL/min — ABNORMAL LOW (ref >60)
GFR, EST NON AFRICAN AMERICAN: 28 mL/min — AB (ref >60)
GLUCOSE: 176 mg/dL — AB (ref 65–99)
Potassium: 3.1 mmol/L — ABNORMAL LOW (ref 3.5–5.1)
Sodium: 139 mmol/L (ref 135–145)

## 2015-06-07 LAB — HEMOGLOBIN AND HEMATOCRIT, BLOOD
HEMATOCRIT: 27.6 % — AB (ref 40.0–52.0)
HEMOGLOBIN: 9.2 g/dL — AB (ref 13.0–18.0)

## 2015-06-07 LAB — MAGNESIUM: MAGNESIUM: 2 mg/dL (ref 1.7–2.4)

## 2015-06-07 LAB — GLUCOSE, CAPILLARY: Glucose-Capillary: 122 mg/dL — ABNORMAL HIGH (ref 65–99)

## 2015-06-07 MED ORDER — POTASSIUM CHLORIDE CRYS ER 20 MEQ PO TBCR
40.0000 meq | EXTENDED_RELEASE_TABLET | Freq: Once | ORAL | Status: AC
  Administered 2015-06-07: 40 meq via ORAL
  Filled 2015-06-07: qty 2

## 2015-06-07 NOTE — Discharge Summary (Signed)
[facility] NAME: [name]    MR#:  [id number]  DATE OF BIRTH:  03-Nov-1926  DATE OF ADMISSION:  06/03/2015 ADMITTING PHYSICIAN: Bettey Costa, MD  DATE OF DISCHARGE: 06/07/2015  PRIMARY CARE PHYSICIAN: Lorelee Market, MD    ADMISSION DIAGNOSIS:  Rectal bleeding [K62.5]  DISCHARGE DIAGNOSIS:  Active Problems:   GIB (gastrointestinal bleeding)  acute delirium   hypokalemia  SECONDARY DIAGNOSIS:   Past Medical History  Diagnosis Date  . Prostate cancer ([place])   . Skin cancer     right cheek  . Hypercholesteremia   . Cardiac disease   . Shingles   . Kidney failure     left  . Hypertension   . Osteoporosis   . CHF (congestive heart failure) [facility])     HOSPITAL COURSE:  81 year old male presents with bright red blood per rectum.  please see Dr. Trellis Paganini dictated history and physical for further details.  GI consultation was obtained who recommended bleeding scan Due to his ongoing GI bleeding.  He underwent bleeding scan on November 25th, which showed active hemorrhage in the large bowel likely in the distal transverse colon, near the splenic flexure.  Vascular surgery consultation was obtained with Dr. dew who did not recommend any embolization as patient was hemodynamically stable and did not have any further bleeding.  While in the hospital he was found to have acute delirium which was possibly made worse by Ativan, which was stopped.  He is doing much better.  Mentation wise. did not have any further bleeding  and remained hemodynamically stable.  He has been tolerating diet and is being discharged home in stable condition.  He is wife and son are agreeable with the discharge plans. DISCHARGE CONDITIONS:  stable  CONSULTS OBTAINED:  Treatment Team:  Dionisio David, MD  DRUG ALLERGIES:   Allergies  Allergen Reactions  . Ativan [Lorazepam] Other (See Comments)    Hallucinations, combative  . Penicillin G  Other (See Comments)    Has patient had a PCN reaction causing immediate rash, facial/tongue/throat swelling, SOB or lightheadedness with hypotension: Yes Has patient had a PCN reaction causing severe rash involving mucus membranes or skin necrosis: No Has patient had a PCN reaction that required hospitalization No Has patient had a PCN reaction occurring within the last 10 years: No If all of the above answers are "NO", then may proceed with Cephalosporin use.     DISCHARGE MEDICATIONS:   Current Discharge Medication List    CONTINUE these medications which have NOT CHANGED   Details  albuterol (PROVENTIL HFA;VENTOLIN HFA) 108 (90 BASE) MCG/ACT inhaler Inhale 2 puffs into the lungs every 6 (six) hours as needed for wheezing or shortness of breath.    atorvastatin (LIPITOR) 40 MG tablet Take 40 mg by mouth daily.    doxazosin (CARDURA) 8 MG tablet Take 8 mg by mouth daily.    ferrous sulfate 325 (65 FE) MG tablet Take 325 mg by mouth daily with breakfast.    hydrALAZINE (APRESOLINE) 25 MG tablet Take 1 tablet by mouth 2 (two) times daily.    levothyroxine (SYNTHROID, LEVOTHROID) 100 MCG tablet Take 100 mcg by mouth daily.    losartan (COZAAR) 100 MG tablet Take 100 mg by mouth daily.    metoprolol succinate (TOPROL-XL) 50 MG 24 hr tablet Take 50 mg by mouth daily. Take with or immediately following a meal.    nitroGLYCERIN (NITROSTAT) 0.4 MG SL tablet Place  0.4 mg under the tongue every 5 (five) minutes as needed for chest pain.    omega-3 acid ethyl esters (LOVAZA) 1 G capsule Take 1 capsule by mouth 2 (two) times daily.    pantoprazole (PROTONIX) 40 MG tablet Take 40 mg by mouth 2 (two) times daily.     potassium chloride (K-DUR) 10 MEQ tablet Take 10 mEq by mouth daily.    torsemide (DEMADEX) 20 MG tablet Take 20 mg by mouth daily.      STOP taking these medications     aspirin EC 81 MG tablet      clopidogrel (PLAVIX) 75 MG tablet         DISCHARGE INSTRUCTIONS:     DIET:  Cardiac diet  DISCHARGE CONDITION:  Good  ACTIVITY:  Activity as tolerated  OXYGEN:  Home Oxygen: No.   Oxygen Delivery: room air  DISCHARGE LOCATION:  home   If you experience worsening of your admission symptoms, develop shortness of breath, life threatening emergency, suicidal or homicidal thoughts you must seek medical attention immediately by calling 911 or calling your MD immediately  if symptoms less severe.  You Must read complete instructions/literature along with all the possible adverse reactions/side effects for all the Medicines you take and that have been prescribed to you. Take any new Medicines after you have completely understood and accpet all the possible adverse reactions/side effects.   Please note  You were cared for by a hospitalist during your hospital stay. If you have any questions about your discharge medications or the care you received while you were in the hospital after you are discharged, you can call the unit and asked to speak with the hospitalist on call if the hospitalist that took care of you is not available. Once you are discharged, your primary care physician will handle any further medical issues. Please note that NO REFILLS for any discharge medications will be authorized once you are discharged, as it is imperative that you return to your primary care physician (or establish a relationship with a primary care physician if you do not have one) for your aftercare needs so that they can reassess your need for medications and monitor your lab values.    On the day of Discharge: VITAL SIGNS:  Blood pressure 157/61, pulse 76, temperature 98.2 F (36.8 C), temperature source Oral, resp. rate 16, height 5\' 6"  (1.676 m), weight 81.103 kg (178 lb 12.8 oz), SpO2 92 %. PHYSICAL EXAMINATION:  GENERAL:  81 y.o.-year-old patient lying in the bed with no acute distress.  EYES: Pupils equal, round, reactive to light and accommodation. No scleral  icterus. Extraocular muscles intact.  HEENT: Head atraumatic, normocephalic. Oropharynx and nasopharynx clear.  NECK:  Supple, no jugular venous distention. No thyroid enlargement, no tenderness.  LUNGS: Normal breath sounds bilaterally, no wheezing, rales,rhonchi or crepitation. No use of accessory muscles of respiration.  CARDIOVASCULAR: S1, S2 normal. No murmurs, rubs, or gallops.  ABDOMEN: Soft, non-tender, non-distended. Bowel sounds present. No organomegaly or mass.  EXTREMITIES: No pedal edema, cyanosis, or clubbing.  NEUROLOGIC: Cranial nerves II through XII are intact. Muscle strength 5/5 in all extremities. Sensation intact. Gait not checked.  PSYCHIATRIC: The patient is alert and oriented x 3.  SKIN: No obvious rash, lesion, or ulcer.  DATA REVIEW:   CBC  Recent Labs Lab 06/05/15 0154  06/07/15 0124  WBC 4.8  --   --   HGB 9.7*  < > 9.2*  HCT 29.1*  < > 27.6*  PLT 163  --   --   < > = values in this interval not displayed.  Chemistries   Recent Labs Lab 06/03/15 1019  06/07/15 0124  NA 140  < > 139  K 2.8*  < > 3.1*  CL 106  < > 107  CO2 23  < > 25  GLUCOSE 198*  < > 176*  BUN 25*  < > 21*  CREATININE 2.04*  < > 2.01*  CALCIUM 8.3*  < > 8.0*  MG  --   < > 2.0  AST 24  --   --   ALT 19  --   --   ALKPHOS 59  --   --   BILITOT 0.6  --   --   < > = values in this interval not displayed.  Cardiac Enzymes  Recent Labs Lab 06/03/15 1019  TROPONINI <0.03    Microbiology Results  Results for orders placed or performed in visit on 05/28/12  Urine culture     Status: None   Collection Time: 05/28/12 12:44 PM  Result Value Ref Range Status   Micro Text Report   Final       SOURCE: CLEAN CATCH    ORGANISM 1                3000 CFU/ML GRAM POSITIVE COCCI   COMMENT                   RESULTS SUGGESTIVE OF CONTAMINATION   ANTIBIOTIC                                                        RADIOLOGY:  No results found.   Management plans discussed  with the patient, family and they are in agreement.  CODE STATUS: Full Code   TOTAL TIME TAKING CARE OF THIS PATIENT: 55 minutes.    [facility] Robecca Fulgham M.D on 06/07/2015 at 7:40 AM  Between 7am to 6pm - Pager - [phone]  After 6pm go to [email or web address] - password [email or web address] [email or web address] [facility]  Office  [phone]  CC: Primary care physician; Lorelee Market, MD Dionisio David, MD Jennings Books MD Loistine Simas MD

## 2015-06-07 NOTE — Progress Notes (Signed)
Alert and oriented. VS's . No signs of acute distress . Discharge instructions given  to patient and family. Patient and wife verbalized understanding.

## 2015-06-07 NOTE — Progress Notes (Signed)
GI Inpatient Follow-up Note  Patient Identification: [name] is a 81 y.o. male with LGIB, positive tagged scan splenic flexure  Subjective:  Passed small amt blood last night. None today.  Some confusion.  Anxious.  Denies abd pain, n/v, f/c.   Scheduled Inpatient Medications:  . atorvastatin  40 mg Oral Daily  . doxazosin  8 mg Oral Daily  . ferrous sulfate  325 mg Oral Q breakfast  . hydrALAZINE  25 mg Oral BID  . levothyroxine  100 mcg Oral Daily  . LORazepam  1 mg Intravenous Once  . losartan  100 mg Oral Daily  . metoprolol succinate  50 mg Oral Daily  . omega-3 acid ethyl esters  1 capsule Oral BID  . pantoprazole (PROTONIX) IV  40 mg Intravenous Q12H  . potassium chloride  10 mEq Oral Daily  . sodium chloride  3 mL Intravenous Q12H  . torsemide  20 mg Oral Daily    Continuous Inpatient Infusions:     PRN Inpatient Medications:  acetaminophen **OR** acetaminophen, albuterol, alum & mag hydroxide-simeth, hydrALAZINE, nitroGLYCERIN, ondansetron **OR** ondansetron (ZOFRAN) IV, senna-docusate     Physical Examination: BP 157/61 mmHg  Pulse 76  Temp(Src) 98.2 F (36.8 C) (Oral)  Resp 16  Ht 5\' 6"  (1.676 m)  Wt 81.103 kg (178 lb 12.8 oz)  BMI 28.87 kg/m2  SpO2 92% Gen: NAD, alert and oriented x 3, irritated  Neck: supple, no JVD or thyromegaly Chest: CTA bilaterally, no wheezes, crackles, or other adventitious sounds CV: RRR, no m/g/c/r Abd: soft, NT, ND, +BS in all four quadrants; no HSM, guarding, ridigity, or rebound tenderness Ext: no edema, well perfused with 2+ pulses, Skin: no rash or lesions noted Lymph: no LAD  Data: Lab Results  Component Value Date   WBC 4.8 06/05/2015   HGB 9.2* 06/07/2015   HCT 27.6* 06/07/2015   MCV 89.0 06/05/2015   PLT 163 06/05/2015    Recent Labs Lab 06/06/15 0944 06/06/15 1713 06/07/15 0124  HGB 10.1* 9.4* 9.2*   Lab Results  Component Value Date   NA 139 06/07/2015   K 3.1* 06/07/2015   CL 107  06/07/2015   CO2 25 06/07/2015   BUN 21* 06/07/2015   CREATININE 2.01* 06/07/2015   Lab Results  Component Value Date   ALT 19 06/03/2015   AST 24 06/03/2015   ALKPHOS 59 06/03/2015   BILITOT 0.6 06/03/2015   No results for input(s): APTT, INR, PTT in the last 168 hours.   Assessment/Plan: Mr. Cariker is a 81 y.o. male with LGIB, positive tagged scan splenic flexure.   Hgb now stable and no bleeding throughout the day today.Likely diverticular bleed.   Recommendations: - safe for d/c from GI standpoint if no further bleeding overnight and Hgb stable in am.    Please call with questions or concerns.  Paislei Dorval, Grace Blight, MD

## 2015-06-07 NOTE — Progress Notes (Signed)
SUBJECTIVE: Pt sitting up eating breakfast, ready to go home.    Filed Vitals:   06/06/15 1112 06/06/15 1654 06/06/15 1848 06/06/15 2038  BP: 158/90 175/58 164/81 157/61  Pulse: 88 69 71 76  Temp: 98.5 F (36.9 C) 98.5 F (36.9 C)  98.2 F (36.8 C)  TempSrc: Oral Oral  Oral  Resp: 18 18  16   Height:      Weight:      SpO2: 93% 96%  92%    Intake/Output Summary (Last 24 hours) at 06/07/15 0828 Last data filed at 06/07/15 0335  Gross per 24 hour  Intake    360 ml  Output    700 ml  Net   -340 ml    LABS: Basic Metabolic Panel:  Recent Labs  06/05/15 0154  06/06/15 0122 06/07/15 0124  NA 141  --   --  139  K 3.0*  --   --  3.1*  CL 109  --   --  107  CO2 26  --   --  25  GLUCOSE 118*  --   --  176*  BUN 25*  --   --  21*  CREATININE 2.16*  --   --  2.01*  CALCIUM 7.9*  --   --  8.0*  MG 2.0  < > 1.9 2.0  < > = values in this interval not displayed. Liver Function Tests: No results for input(s): AST, ALT, ALKPHOS, BILITOT, PROT, ALBUMIN in the last 72 hours. No results for input(s): LIPASE, AMYLASE in the last 72 hours. CBC:  Recent Labs  06/05/15 0154  06/06/15 1713 06/07/15 0124  WBC 4.8  --   --   --   HGB 9.7*  < > 9.4* 9.2*  HCT 29.1*  < > 28.2* 27.6*  MCV 89.0  --   --   --   PLT 163  --   --   --   < > = values in this interval not displayed. Cardiac Enzymes: No results for input(s): CKTOTAL, CKMB, CKMBINDEX, TROPONINI in the last 72 hours. BNP: Invalid input(s): POCBNP D-Dimer: No results for input(s): DDIMER in the last 72 hours. Hemoglobin A1C: No results for input(s): HGBA1C in the last 72 hours. Fasting Lipid Panel: No results for input(s): CHOL, HDL, LDLCALC, TRIG, CHOLHDL, LDLDIRECT in the last 72 hours. Thyroid Function Tests: No results for input(s): TSH, T4TOTAL, T3FREE, THYROIDAB in the last 72 hours.  Invalid input(s): FREET3 Anemia Panel: No results for input(s): VITAMINB12, FOLATE, FERRITIN, TIBC, IRON, RETICCTPCT in the  last 72 hours.   PHYSICAL EXAM General: Well developed, well nourished, in no acute distress HEENT: Normocephalic and atramatic Neck: No JVD.  Lungs: Clear bilaterally to auscultation and percussion. Heart: HRRR . Normal S1 and S2 without gallops or murmurs.  Abdomen: Bowel sounds are positive, abdomen soft and non-tender  Msk: Back normal, normal gait. Normal strength and tone for age. Extremities: No clubbing, cyanosis or edema.  Neuro: Alert and oriented X 3. Psych: Good affect, responds appropriately  TELEMETRY: Reviewed telemetry pt in NSR with sinus arrhythmia and PVCs 80s  ASSESSMENT AND PLAN: 1. CAD: continue statin and restart asa with Hg will tolerate  2. Asymptomatic bradycardia: HR now in 80s, will continue to monitor, can decrease metoprolol if bradycardia or pauses occur again.   Pt given f/u in office 12/2 at 2:30pm.    Patient and plan discussed with supervising provider, Dr. Neoma Laming, who agrees with above findings.  Kelby Fam [place], [place]  06/07/2015 8:28 AM

## 2015-06-07 NOTE — Discharge Instructions (Signed)
Gastrointestinal Bleeding °Gastrointestinal bleeding is bleeding somewhere along the path that food travels through the body (digestive tract). This path is anywhere between the mouth and the opening of the butt (anus). You may have blood in your throw up (vomit) or in your poop (stools). If there is a lot of bleeding, you may need to stay in the hospital. °HOME CARE °· Only take medicine as told by your doctor. °· Eat foods with fiber such as whole grains, fruits, and vegetables. You can also try eating 1 to 3 prunes a day. °· Drink enough fluids to keep your pee (urine) clear or pale yellow. °GET HELP RIGHT AWAY IF:  °· Your bleeding gets worse. °· You feel dizzy, weak, or you pass out (faint). °· You have bad cramps in your back or belly (abdomen). °· You have large blood clumps (clots) in your poop. °· Your problems are getting worse. °MAKE SURE YOU:  °· Understand these instructions. °· Will watch your condition. °· Will get help right away if you are not doing well or get worse. °  °This information is not intended to replace advice given to you by your health care provider. Make sure you discuss any questions you have with your health care provider. °  °Document Released: 04/03/2008 Document Revised: 06/11/2012 Document Reviewed: 12/13/2014 °Elsevier Interactive Patient Education ©2016 [employer] ° °

## 2015-07-12 ENCOUNTER — Emergency Department
Admission: EM | Admit: 2015-07-12 | Discharge: 2015-07-12 | Disposition: A | Discharge status: Home / Self Care | Payer: Medicare Other | Attending: Emergency Medicine | Admitting: Emergency Medicine

## 2015-07-12 ENCOUNTER — Emergency Department: Payer: Medicare Other

## 2015-07-12 ENCOUNTER — Encounter: Payer: Self-pay | Admitting: *Deleted

## 2015-07-12 DIAGNOSIS — K5900 Constipation, unspecified: Secondary | ICD-10-CM | POA: Diagnosis not present

## 2015-07-12 DIAGNOSIS — I10 Essential (primary) hypertension: Secondary | ICD-10-CM | POA: Diagnosis not present

## 2015-07-12 DIAGNOSIS — R079 Chest pain, unspecified: Secondary | ICD-10-CM | POA: Diagnosis present

## 2015-07-12 DIAGNOSIS — Z79899 Other long term (current) drug therapy: Secondary | ICD-10-CM | POA: Insufficient documentation

## 2015-07-12 DIAGNOSIS — Z880 Allergy status to penicillin: Secondary | ICD-10-CM | POA: Insufficient documentation

## 2015-07-12 DIAGNOSIS — Z7982 Long term (current) use of aspirin: Secondary | ICD-10-CM | POA: Diagnosis not present

## 2015-07-12 DIAGNOSIS — Z87891 Personal history of nicotine dependence: Secondary | ICD-10-CM | POA: Diagnosis not present

## 2015-07-12 DIAGNOSIS — R1084 Generalized abdominal pain: Secondary | ICD-10-CM

## 2015-07-12 LAB — URINALYSIS COMPLETE WITH MICROSCOPIC (ARMC ONLY)
Bacteria, UA: NONE SEEN
Bilirubin Urine: NEGATIVE
GLUCOSE, UA: 50 mg/dL — AB
HGB URINE DIPSTICK: NEGATIVE
LEUKOCYTES UA: NEGATIVE
Nitrite: NEGATIVE
Protein, ur: 30 mg/dL — AB
SPECIFIC GRAVITY, URINE: 1.015 (ref 1.005–1.030)
pH: 6 (ref 5.0–8.0)

## 2015-07-12 LAB — COMPREHENSIVE METABOLIC PANEL
ALK PHOS: 57 U/L (ref 38–126)
ALT: 10 U/L — AB (ref 17–63)
AST: 13 U/L — AB (ref 15–41)
Albumin: 3.5 g/dL (ref 3.5–5.0)
Anion gap: 9 (ref 5–15)
BUN: 18 mg/dL (ref 6–20)
CALCIUM: 8.6 mg/dL — AB (ref 8.9–10.3)
CHLORIDE: 102 mmol/L (ref 101–111)
CO2: 27 mmol/L (ref 22–32)
CREATININE: 2.19 mg/dL — AB (ref 0.61–1.24)
GFR calc Af Amer: 30 mL/min — ABNORMAL LOW (ref >60)
GFR calc non Af Amer: 26 mL/min — ABNORMAL LOW (ref >60)
GLUCOSE: 150 mg/dL — AB (ref 65–99)
Potassium: 2.6 mmol/L — CL (ref 3.5–5.1)
SODIUM: 138 mmol/L (ref 135–145)
Total Bilirubin: 1.1 mg/dL (ref 0.3–1.2)
Total Protein: 6.3 g/dL — ABNORMAL LOW (ref 6.5–8.1)

## 2015-07-12 LAB — CBC WITH DIFFERENTIAL/PLATELET
BASOS ABS: 0 10*3/uL (ref 0–0.1)
Basophils Relative: 0 %
EOS PCT: 0 %
Eosinophils Absolute: 0 10*3/uL (ref 0–0.7)
HEMATOCRIT: 33.9 % — AB (ref 40.0–52.0)
Hemoglobin: 11.4 g/dL — ABNORMAL LOW (ref 13.0–18.0)
LYMPHS ABS: 0.4 10*3/uL — AB (ref 1.0–3.6)
LYMPHS PCT: 5 %
MCH: 29.4 pg (ref 26.0–34.0)
MCHC: 33.7 g/dL (ref 32.0–36.0)
MCV: 87.3 fL (ref 80.0–100.0)
MONO ABS: 0.4 10*3/uL (ref 0.2–1.0)
MONOS PCT: 5 %
Neutro Abs: 6.7 10*3/uL — ABNORMAL HIGH (ref 1.4–6.5)
Neutrophils Relative %: 90 %
PLATELETS: 161 10*3/uL (ref 150–440)
RBC: 3.89 MIL/uL — AB (ref 4.40–5.90)
RDW: 15.5 % — ABNORMAL HIGH (ref 11.5–14.5)
WBC: 7.5 10*3/uL (ref 3.8–10.6)

## 2015-07-12 LAB — LIPASE, BLOOD: Lipase: 15 U/L (ref 11–51)

## 2015-07-12 MED ORDER — IOHEXOL 240 MG/ML SOLN
25.0000 mL | INTRAMUSCULAR | Status: AC
  Administered 2015-07-12: 25 mL via ORAL
  Administered 2015-07-12: 50 mL via ORAL

## 2015-07-12 MED ORDER — DOCUSATE SODIUM 100 MG PO CAPS: 200.0000 mg | ORAL_CAPSULE | Freq: Two times a day (BID) | ORAL | Status: DC

## 2015-07-12 MED ORDER — SENNA 8.6 MG PO TABS: 2.0000 | ORAL_TABLET | Freq: Two times a day (BID) | ORAL | Status: DC

## 2015-07-12 MED ORDER — SODIUM CHLORIDE 0.9 % IV BOLUS (SEPSIS)
500.0000 mL | Freq: Once | INTRAVENOUS | Status: AC
  Administered 2015-07-12: 500 mL via INTRAVENOUS

## 2015-07-12 MED ORDER — POLYETHYLENE GLYCOL 3350 17 GM/SCOOP PO POWD: ORAL | Status: DC

## 2015-07-12 MED ORDER — POTASSIUM CHLORIDE 10 MEQ/100ML IV SOLN
10.0000 meq | INTRAVENOUS | Status: DC
  Administered 2015-07-12 (×2): 10 meq via INTRAVENOUS
  Filled 2015-07-12 (×4): qty 100

## 2015-07-12 MED ORDER — POTASSIUM CHLORIDE CRYS ER 20 MEQ PO TBCR
40.0000 meq | EXTENDED_RELEASE_TABLET | Freq: Once | ORAL | Status: AC
  Administered 2015-07-12: 40 meq via ORAL
  Filled 2015-07-12: qty 2

## 2015-07-12 MED ORDER — IOHEXOL 300 MG/ML  SOLN: 25.0000 mL | Freq: Once | INTRAMUSCULAR | Status: DC | PRN

## 2015-07-12 NOTE — ED Notes (Signed)
Says has not been going to bathroom well-bm.

## 2015-07-12 NOTE — ED Provider Notes (Signed)
[facility] Emergency Department Provider Note  ____________________________________________  Time seen: 7:40 AM  I have reviewed the triage vital signs and the nursing notes.   HISTORY  Chief Complaint Chest Pain    HPI [name] is a 81 y.o. male who complains of generalized abdominal pain for the past 24 hours. Very gradual onset. He reports that he has been having difficulty moving his bowels and passing only a small amount of stool. He does say that he is passing gas. No vomiting. No fever. Took some Dulcolax this morning but otherwise has not taken any kind of stool softeners.Denies chest pain abdominal pain vomiting diarrhea fever chills urinary symptoms back pain shortness of breath dizziness or syncope. No recent abdominal surgeries.     Past Medical History  Diagnosis Date  . Prostate cancer ([place])   . Skin cancer     right cheek  . Hypercholesteremia   . Cardiac disease   . Shingles   . Kidney failure     left  . Hypertension   . Osteoporosis   . CHF (congestive heart failure) [facility])      Patient Active Problem List   Diagnosis Date Noted  . GIB (gastrointestinal bleeding) 06/03/2015     Past Surgical History  Procedure Laterality Date  . Cardiac catheterization  04/13/2014     Current Outpatient Rx  Name  Route  Sig  Dispense  Refill  . albuterol (PROVENTIL HFA;VENTOLIN HFA) 108 (90 BASE) MCG/ACT inhaler   Inhalation   Inhale 2 puffs into the lungs every 6 (six) hours as needed for wheezing or shortness of breath.         Marland Kitchen aspirin EC 81 MG tablet   Oral   Take 81 mg by mouth at bedtime.         Marland Kitchen atorvastatin (LIPITOR) 40 MG tablet   Oral   Take 40 mg by mouth at bedtime.          . Cholecalciferol (VITAMIN D3) 5000 units TABS   Oral   Take 5,000 Units by mouth daily.         Marland Kitchen doxazosin (CARDURA) 8 MG tablet   Oral   Take 8 mg by mouth at bedtime.          . ferrous sulfate 325 (65 FE) MG tablet  Oral   Take 325 mg by mouth 2 (two) times daily.          . hydrALAZINE (APRESOLINE) 25 MG tablet   Oral   Take 1 tablet by mouth 2 (two) times daily.         Marland Kitchen levothyroxine (SYNTHROID, LEVOTHROID) 100 MCG tablet   Oral   Take 100 mcg by mouth daily.         Marland Kitchen losartan (COZAAR) 100 MG tablet   Oral   Take 100 mg by mouth daily.         . metoprolol succinate (TOPROL-XL) 50 MG 24 hr tablet   Oral   Take 50 mg by mouth daily.          . nitroGLYCERIN (NITROSTAT) 0.4 MG SL tablet   Sublingual   Place 0.4 mg under the tongue every 5 (five) minutes as needed for chest pain.         Marland Kitchen omega-3 acid ethyl esters (LOVAZA) 1 G capsule   Oral   Take 1 capsule by mouth 2 (two) times daily.         . pantoprazole (  PROTONIX) 40 MG tablet   Oral   Take 40 mg by mouth 2 (two) times daily.          . potassium chloride (K-DUR) 10 MEQ tablet   Oral   Take 10 mEq by mouth daily.         Marland Kitchen torsemide (DEMADEX) 20 MG tablet   Oral   Take 20 mg by mouth daily.         Marland Kitchen docusate sodium (COLACE) 100 MG capsule   Oral   Take 2 capsules (200 mg total) by mouth 2 (two) times daily.   120 capsule   0   . polyethylene glycol powder (GLYCOLAX/MIRALAX) powder      2 cap fulls in a full glass of water, three times a day, for 5 days.   255 g   0   . senna (SENOKOT) 8.6 MG TABS tablet   Oral   Take 2 tablets (17.2 mg total) by mouth 2 (two) times daily.   120 each   0      Allergies Ativan and Penicillin g   No family history on file.  Social History Social History  Substance Use Topics  . Smoking status: Former Smoker    Types: Cigarettes, Cigars  . Smokeless tobacco: Current User    Types: Chew  . Alcohol Use: No    Review of Systems  Constitutional:   No fever or chills. No weight changes Eyes:   No blurry vision or double vision.  ENT:   No sore throat. Cardiovascular:   No chest pain. Respiratory:   No dyspnea or cough. Gastrointestinal:    Positive as above. Abdominal pain without vomiting and diarrhea.  No BRBPR or melena. Genitourinary:   Negative for dysuria, urinary retention, bloody urine, or difficulty urinating. Musculoskeletal:   Negative for back pain. No joint swelling or pain. Skin:   Negative for rash. Neurological:   Negative for headaches, focal weakness or numbness. Psychiatric:  No anxiety or depression.   Endocrine:  No hot/cold intolerance, changes in energy, or sleep difficulty.  10-point ROS otherwise negative.  ____________________________________________   PHYSICAL EXAM:  VITAL SIGNS: ED Triage Vitals  Enc Vitals Group     BP 07/12/15 0728 151/56 mmHg     Pulse Rate 07/12/15 0728 62     Resp 07/12/15 0728 20     Temp 07/12/15 0728 97.6 F (36.4 C)     Temp Source 07/12/15 0728 Oral     SpO2 07/12/15 0728 97 %     Weight 07/12/15 0728 180 lb (81.647 kg)     Height 07/12/15 0728 5\' 8"  (1.727 m)     Head Cir --      Peak Flow --      Pain Score 07/12/15 0729 5     Pain Loc --      Pain Edu? --      Excl. in [place]? --     Vital signs reviewed, nursing assessments reviewed.   Constitutional:   Alert and oriented. Well appearing and in no distress. Eyes:   No scleral icterus. No conjunctival pallor. PERRL. EOMI ENT   Head:   Normocephalic and atraumatic.   Nose:   No congestion/rhinnorhea. No septal hematoma   Mouth/Throat:   MMM, no pharyngeal erythema. No peritonsillar mass. No uvula shift.   Neck:   No stridor. No SubQ emphysema. No meningismus. Hematological/Lymphatic/Immunilogical:   No cervical lymphadenopathy. Cardiovascular:   RRR. Normal and symmetric distal pulses are  present in all extremities. No murmurs, rubs, or gallops. Respiratory:   Normal respiratory effort without tachypnea nor retractions. Breath sounds are clear and equal bilaterally. No wheezes/rales/rhonchi. Gastrointestinal:   Soft with mild generalized tenderness. Mild distention.. There is no CVA  tenderness.  No rebound, rigidity, or guarding. Genitourinary:   deferred Musculoskeletal:   Nontender with normal range of motion in all extremities. No joint effusions.  No lower extremity tenderness.  No edema. Neurologic:   Normal speech and language.  CN 2-10 normal. Motor grossly intact. No pronator drift.  Normal gait. No gross focal neurologic deficits are appreciated.  Skin:    Skin is warm, dry and intact. No rash noted.  No petechiae, purpura, or bullae. Psychiatric:   Mood and affect are normal. Speech and behavior are normal. Patient exhibits appropriate insight and judgment.  ____________________________________________    LABS (pertinent positives/negatives) (all labs ordered are listed, but only abnormal results are displayed) Labs Reviewed  COMPREHENSIVE METABOLIC PANEL - Abnormal; Notable for the following:    Potassium 2.6 (*)    Glucose, Bld 150 (*)    Creatinine, Ser 2.19 (*)    Calcium 8.6 (*)    Total Protein 6.3 (*)    AST 13 (*)    ALT 10 (*)    GFR calc non Af Amer 26 (*)    GFR calc Af Amer 30 (*)    All other components within normal limits  CBC WITH DIFFERENTIAL/PLATELET - Abnormal; Notable for the following:    RBC 3.89 (*)    Hemoglobin 11.4 (*)    HCT 33.9 (*)    RDW 15.5 (*)    Neutro Abs 6.7 (*)    Lymphs Abs 0.4 (*)    All other components within normal limits  URINALYSIS COMPLETEWITH MICROSCOPIC ([facility] ONLY) - Abnormal; Notable for the following:    Color, Urine YELLOW (*)    APPearance CLEAR (*)    Glucose, UA 50 (*)    Ketones, ur 1+ (*)    Protein, ur 30 (*)    Squamous Epithelial / LPF 0-5 (*)    All other components within normal limits  LIPASE, BLOOD   ____________________________________________   EKG  Interpreted by me Atrial fibrillation rate of 78, normal axis intervals QRS and ST segments. There is a right bundle-branch block. Normal T waves  ____________________________________________    RADIOLOGY  CT abdomen  and pelvis unremarkable. Chronic cholelithiasis and diverticulosis without acute inflammatory changes.  ____________________________________________   PROCEDURES   ____________________________________________   INITIAL IMPRESSION / ASSESSMENT AND PLAN / ED COURSE  Pertinent labs & imaging results that were available during my care of the patient were reviewed by me and considered in my medical decision making (see chart for details).  Patient presents with generalized abdominal pain constipation. Low suspicion for perforation or obstruction or intra-abdominal infection but we'll do a CT scan given the patient's age and comorbidities.  ----------------------------------------- 12:11 PM on 07/12/2015 -----------------------------------------  Workup negative. We'll start on a GI regimen was senna Colace and MiraLAX and have him follow up with primary care. Sedation for AAA or mesenteric ischemia. Vitals remained stable with underlying hypertension.     ____________________________________________   FINAL CLINICAL IMPRESSION(S) / ED DIAGNOSES  Final diagnoses:  Generalized abdominal pain  Constipation, unspecified constipation type      Carrie Mew, MD 07/12/15 1211

## 2015-07-12 NOTE — ED Notes (Signed)
Pt reports having left chest and stomach pain all night, pt denies any other symptoms

## 2015-07-12 NOTE — Discharge Instructions (Signed)
Abdominal Pain, Adult °Many things can cause abdominal pain. Usually, abdominal pain is not caused by a disease and will improve without treatment. It can often be observed and treated at home. Your health care provider will do a physical exam and possibly order blood tests and X-rays to help determine the seriousness of your pain. However, in many cases, more time must pass before a clear cause of the pain can be found. Before that point, your health care provider may not know if you need more testing or further treatment. °HOME CARE INSTRUCTIONS °Monitor your abdominal pain for any changes. The following actions may help to alleviate any discomfort you are experiencing: °· Only take over-the-counter or prescription medicines as directed by your health care provider. °· Do not take laxatives unless directed to do so by your health care provider. °· Try a clear liquid diet (broth, tea, or water) as directed by your health care provider. Slowly move to a bland diet as tolerated. °SEEK MEDICAL CARE IF: °· You have unexplained abdominal pain. °· You have abdominal pain associated with nausea or diarrhea. °· You have pain when you urinate or have a bowel movement. °· You experience abdominal pain that wakes you in the night. °· You have abdominal pain that is worsened or improved by eating food. °· You have abdominal pain that is worsened with eating fatty foods. °· You have a fever. °SEEK IMMEDIATE MEDICAL CARE IF: °· Your pain does not go away within 2 hours. °· You keep throwing up (vomiting). °· Your pain is felt only in portions of the abdomen, such as the right side or the left lower portion of the abdomen. °· You pass bloody or black tarry stools. °MAKE SURE YOU: °· Understand these instructions. °· Will watch your condition. °· Will get help right away if you are not doing well or get worse. °  °This information is not intended to replace advice given to you by your health care provider. Make sure you discuss  any questions you have with your health care provider. °  °Document Released: 04/04/2005 Document Revised: 03/16/2015 Document Reviewed: 03/04/2013 °Elsevier Interactive Patient Education ©2016 [employer] ° °Constipation, Adult °Constipation is when a person has fewer than three bowel movements a week, has difficulty having a bowel movement, or has stools that are dry, hard, or larger than normal. As people grow older, constipation is more common. A low-fiber diet, not taking in enough fluids, and taking certain medicines may make constipation worse.  °CAUSES  °· Certain medicines, such as antidepressants, pain medicine, iron supplements, antacids, and water pills.   °· Certain diseases, such as diabetes, irritable bowel syndrome (IBS), thyroid disease, or depression.   °· Not drinking enough water.   °· Not eating enough fiber-rich foods.   °· Stress or travel.   °· Lack of physical activity or exercise.   °· Ignoring the urge to have a bowel movement.   °· Using laxatives too much.   °SIGNS AND SYMPTOMS  °· Having fewer than three bowel movements a week.   °· Straining to have a bowel movement.   °· Having stools that are hard, dry, or larger than normal.   °· Feeling full or bloated.   °· Pain in the lower abdomen.   °· Not feeling relief after having a bowel movement.   °DIAGNOSIS  °Your health care provider will take a medical history and perform a physical exam. Further testing may be done for severe constipation. Some tests may include: °· A barium enema X-ray to examine your rectum, colon, and, sometimes,   your small intestine.   °· A sigmoidoscopy to examine your lower colon.   °· A colonoscopy to examine your entire colon. °TREATMENT  °Treatment will depend on the severity of your constipation and what is causing it. Some dietary treatments include drinking more fluids and eating more fiber-rich foods. Lifestyle treatments may include regular exercise. If these diet and lifestyle recommendations do not  help, your health care provider may recommend taking over-the-counter laxative medicines to help you have bowel movements. Prescription medicines may be prescribed if over-the-counter medicines do not work.  °HOME CARE INSTRUCTIONS  °· Eat foods that have a lot of fiber, such as fruits, vegetables, whole grains, and beans. °· Limit foods high in fat and processed sugars, such as french fries, hamburgers, cookies, candies, and soda.   °· A fiber supplement may be added to your diet if you cannot get enough fiber from foods.   °· Drink enough fluids to keep your urine clear or pale yellow.   °· Exercise regularly or as directed by your health care provider.   °· Go to the restroom when you have the urge to go. Do not hold it.   °· Only take over-the-counter or prescription medicines as directed by your health care provider. Do not take other medicines for constipation without talking to your health care provider first.   °SEEK IMMEDIATE MEDICAL CARE IF:  °· You have bright red blood in your stool.   °· Your constipation lasts for more than 4 days or gets worse.   °· You have abdominal or rectal pain.   °· You have thin, pencil-like stools.   °· You have unexplained weight loss. °MAKE SURE YOU:  °· Understand these instructions. °· Will watch your condition. °· Will get help right away if you are not doing well or get worse. °  °This information is not intended to replace advice given to you by your health care provider. Make sure you discuss any questions you have with your health care provider. °  °Document Released: 03/23/2004 Document Revised: 07/16/2014 Document Reviewed: 04/06/2013 °Elsevier Interactive Patient Education ©2016 Elsevier Inc. ° °

## 2015-07-20 ENCOUNTER — Inpatient Hospital Stay
Admission: EM | Admit: 2015-07-20 | Discharge: 2015-07-23 | DRG: 682 | Disposition: A | Discharge status: Home Health | Payer: Medicare Other | Attending: Internal Medicine | Admitting: Internal Medicine

## 2015-07-20 ENCOUNTER — Encounter: Payer: Self-pay | Admitting: Emergency Medicine

## 2015-07-20 DIAGNOSIS — Z79899 Other long term (current) drug therapy: Secondary | ICD-10-CM | POA: Diagnosis not present

## 2015-07-20 DIAGNOSIS — T502X5A Adverse effect of carbonic-anhydrase inhibitors, benzothiadiazides and other diuretics, initial encounter: Secondary | ICD-10-CM | POA: Diagnosis present

## 2015-07-20 DIAGNOSIS — N139 Obstructive and reflux uropathy, unspecified: Secondary | ICD-10-CM | POA: Diagnosis present

## 2015-07-20 DIAGNOSIS — K5791 Diverticulosis of intestine, part unspecified, without perforation or abscess with bleeding: Secondary | ICD-10-CM | POA: Diagnosis present

## 2015-07-20 DIAGNOSIS — T370X5A Adverse effect of sulfonamides, initial encounter: Secondary | ICD-10-CM | POA: Diagnosis present

## 2015-07-20 DIAGNOSIS — Z7982 Long term (current) use of aspirin: Secondary | ICD-10-CM | POA: Diagnosis not present

## 2015-07-20 DIAGNOSIS — D649 Anemia, unspecified: Secondary | ICD-10-CM | POA: Diagnosis present

## 2015-07-20 DIAGNOSIS — N183 Chronic kidney disease, stage 3 (moderate): Secondary | ICD-10-CM | POA: Diagnosis present

## 2015-07-20 DIAGNOSIS — N189 Chronic kidney disease, unspecified: Secondary | ICD-10-CM

## 2015-07-20 DIAGNOSIS — N179 Acute kidney failure, unspecified: Principal | ICD-10-CM | POA: Diagnosis present

## 2015-07-20 DIAGNOSIS — Z888 Allergy status to other drugs, medicaments and biological substances status: Secondary | ICD-10-CM | POA: Diagnosis not present

## 2015-07-20 DIAGNOSIS — M810 Age-related osteoporosis without current pathological fracture: Secondary | ICD-10-CM | POA: Diagnosis present

## 2015-07-20 DIAGNOSIS — E876 Hypokalemia: Secondary | ICD-10-CM | POA: Diagnosis present

## 2015-07-20 DIAGNOSIS — I509 Heart failure, unspecified: Secondary | ICD-10-CM | POA: Diagnosis present

## 2015-07-20 DIAGNOSIS — I130 Hypertensive heart and chronic kidney disease with heart failure and stage 1 through stage 4 chronic kidney disease, or unspecified chronic kidney disease: Secondary | ICD-10-CM | POA: Diagnosis present

## 2015-07-20 DIAGNOSIS — I10 Essential (primary) hypertension: Secondary | ICD-10-CM

## 2015-07-20 DIAGNOSIS — Z8546 Personal history of malignant neoplasm of prostate: Secondary | ICD-10-CM

## 2015-07-20 DIAGNOSIS — R531 Weakness: Secondary | ICD-10-CM

## 2015-07-20 DIAGNOSIS — E039 Hypothyroidism, unspecified: Secondary | ICD-10-CM | POA: Diagnosis present

## 2015-07-20 DIAGNOSIS — Z87442 Personal history of urinary calculi: Secondary | ICD-10-CM | POA: Diagnosis not present

## 2015-07-20 DIAGNOSIS — N320 Bladder-neck obstruction: Secondary | ICD-10-CM

## 2015-07-20 DIAGNOSIS — Z720 Tobacco use: Secondary | ICD-10-CM

## 2015-07-20 DIAGNOSIS — E785 Hyperlipidemia, unspecified: Secondary | ICD-10-CM | POA: Diagnosis present

## 2015-07-20 DIAGNOSIS — Z880 Allergy status to penicillin: Secondary | ICD-10-CM | POA: Diagnosis not present

## 2015-07-20 DIAGNOSIS — R410 Disorientation, unspecified: Secondary | ICD-10-CM

## 2015-07-20 DIAGNOSIS — Z85828 Personal history of other malignant neoplasm of skin: Secondary | ICD-10-CM

## 2015-07-20 DIAGNOSIS — E7800 Pure hypercholesterolemia, unspecified: Secondary | ICD-10-CM | POA: Diagnosis present

## 2015-07-20 LAB — CBC
HEMATOCRIT: 31.4 % — AB (ref 40.0–52.0)
HEMOGLOBIN: 10.6 g/dL — AB (ref 13.0–18.0)
MCH: 28.9 pg (ref 26.0–34.0)
MCHC: 33.8 g/dL (ref 32.0–36.0)
MCV: 85.5 fL (ref 80.0–100.0)
Platelets: 197 10*3/uL (ref 150–440)
RBC: 3.67 MIL/uL — AB (ref 4.40–5.90)
RDW: 16.3 % — ABNORMAL HIGH (ref 11.5–14.5)
WBC: 4.5 10*3/uL (ref 3.8–10.6)

## 2015-07-20 LAB — COMPREHENSIVE METABOLIC PANEL
ALK PHOS: 88 U/L (ref 38–126)
ALT: 22 U/L (ref 17–63)
AST: 26 U/L (ref 15–41)
Albumin: 2.9 g/dL — ABNORMAL LOW (ref 3.5–5.0)
Anion gap: 12 (ref 5–15)
BILIRUBIN TOTAL: 0.4 mg/dL (ref 0.3–1.2)
BUN: 60 mg/dL — AB (ref 6–20)
CALCIUM: 8.3 mg/dL — AB (ref 8.9–10.3)
CHLORIDE: 101 mmol/L (ref 101–111)
CO2: 22 mmol/L (ref 22–32)
CREATININE: 5.31 mg/dL — AB (ref 0.61–1.24)
GFR, EST AFRICAN AMERICAN: 10 mL/min — AB (ref >60)
GFR, EST NON AFRICAN AMERICAN: 9 mL/min — AB (ref >60)
Glucose, Bld: 105 mg/dL — ABNORMAL HIGH (ref 65–99)
Potassium: 2.7 mmol/L — CL (ref 3.5–5.1)
Sodium: 135 mmol/L (ref 135–145)
TOTAL PROTEIN: 6 g/dL — AB (ref 6.5–8.1)

## 2015-07-20 LAB — LIPASE, BLOOD: LIPASE: 45 U/L (ref 11–51)

## 2015-07-20 MED ORDER — SODIUM CHLORIDE 0.9 % IV SOLN: Freq: Once | INTRAVENOUS | Status: DC

## 2015-07-20 MED ORDER — HYDRALAZINE HCL 25 MG PO TABS
25.0000 mg | ORAL_TABLET | Freq: Two times a day (BID) | ORAL | Status: DC
  Administered 2015-07-20 – 2015-07-22 (×5): 25 mg via ORAL
  Filled 2015-07-20 (×5): qty 1

## 2015-07-20 MED ORDER — METOPROLOL SUCCINATE ER 50 MG PO TB24
50.0000 mg | ORAL_TABLET | Freq: Every day | ORAL | Status: DC
  Administered 2015-07-20 – 2015-07-23 (×4): 50 mg via ORAL
  Filled 2015-07-20 (×4): qty 1

## 2015-07-20 MED ORDER — VITAMIN D3 25 MCG (1000 UNIT) PO TABS
5000.0000 [IU] | ORAL_TABLET | Freq: Every day | ORAL | Status: DC
  Administered 2015-07-20 – 2015-07-23 (×4): 5000 [IU] via ORAL
  Filled 2015-07-20 (×8): qty 5

## 2015-07-20 MED ORDER — ALBUTEROL SULFATE (2.5 MG/3ML) 0.083% IN NEBU
2.5000 mg | INHALATION_SOLUTION | Freq: Four times a day (QID) | RESPIRATORY_TRACT | Status: DC | PRN
Start: 2015-07-20 — End: 2015-07-23

## 2015-07-20 MED ORDER — TAMSULOSIN HCL 0.4 MG PO CAPS
0.4000 mg | ORAL_CAPSULE | Freq: Every day | ORAL | Status: DC
  Administered 2015-07-20 – 2015-07-21 (×2): 0.4 mg via ORAL
  Filled 2015-07-20 (×2): qty 1

## 2015-07-20 MED ORDER — LEVOTHYROXINE SODIUM 100 MCG PO TABS
100.0000 ug | ORAL_TABLET | Freq: Every day | ORAL | Status: DC
  Administered 2015-07-21 – 2015-07-23 (×3): 100 ug via ORAL
  Filled 2015-07-20 (×5): qty 1

## 2015-07-20 MED ORDER — POTASSIUM CHLORIDE IN NACL 20-0.9 MEQ/L-% IV SOLN
INTRAVENOUS | Status: DC
  Administered 2015-07-20 – 2015-07-23 (×5): via INTRAVENOUS
  Filled 2015-07-20 (×9): qty 1000

## 2015-07-20 MED ORDER — SUCRALFATE 1 G PO TABS
1.0000 g | ORAL_TABLET | Freq: Three times a day (TID) | ORAL | Status: DC
  Administered 2015-07-20 – 2015-07-23 (×10): 1 g via ORAL
  Filled 2015-07-20 (×10): qty 1

## 2015-07-20 MED ORDER — POTASSIUM CHLORIDE IN NACL 40-0.9 MEQ/L-% IV SOLN: INTRAVENOUS | Status: DC

## 2015-07-20 MED ORDER — OMEGA-3-ACID ETHYL ESTERS 1 G PO CAPS
1.0000 | ORAL_CAPSULE | Freq: Two times a day (BID) | ORAL | Status: DC
  Administered 2015-07-20 – 2015-07-23 (×7): 1 g via ORAL
  Filled 2015-07-20 (×7): qty 1

## 2015-07-20 MED ORDER — HEPARIN SODIUM (PORCINE) 5000 UNIT/ML IJ SOLN
5000.0000 [IU] | Freq: Three times a day (TID) | INTRAMUSCULAR | Status: DC
  Administered 2015-07-20 – 2015-07-23 (×8): 5000 [IU] via SUBCUTANEOUS
  Filled 2015-07-20 (×8): qty 1

## 2015-07-20 MED ORDER — ALBUTEROL SULFATE HFA 108 (90 BASE) MCG/ACT IN AERS: 2.0000 | INHALATION_SPRAY | Freq: Four times a day (QID) | RESPIRATORY_TRACT | Status: DC | PRN

## 2015-07-20 MED ORDER — FERROUS SULFATE 325 (65 FE) MG PO TABS
325.0000 mg | ORAL_TABLET | Freq: Two times a day (BID) | ORAL | Status: DC
  Administered 2015-07-20 – 2015-07-23 (×7): 325 mg via ORAL
  Filled 2015-07-20 (×7): qty 1

## 2015-07-20 MED ORDER — DOXAZOSIN MESYLATE 4 MG PO TABS
8.0000 mg | ORAL_TABLET | Freq: Every day | ORAL | Status: DC
  Administered 2015-07-20 – 2015-07-22 (×3): 8 mg via ORAL
  Filled 2015-07-20 (×3): qty 2
  Filled 2015-07-20: qty 1

## 2015-07-20 MED ORDER — ASPIRIN EC 81 MG PO TBEC
81.0000 mg | DELAYED_RELEASE_TABLET | Freq: Every day | ORAL | Status: DC
  Administered 2015-07-20 – 2015-07-22 (×3): 81 mg via ORAL
  Filled 2015-07-20 (×3): qty 1

## 2015-07-20 MED ORDER — ATORVASTATIN CALCIUM 20 MG PO TABS
40.0000 mg | ORAL_TABLET | Freq: Every day | ORAL | Status: DC
  Administered 2015-07-20 – 2015-07-22 (×3): 40 mg via ORAL
  Filled 2015-07-20 (×3): qty 2

## 2015-07-20 MED ORDER — ENOXAPARIN SODIUM 30 MG/0.3ML ~~LOC~~ SOLN: 30.0000 mg | SUBCUTANEOUS | Status: DC

## 2015-07-20 MED ORDER — DOCUSATE SODIUM 100 MG PO CAPS
200.0000 mg | ORAL_CAPSULE | Freq: Two times a day (BID) | ORAL | Status: DC
  Administered 2015-07-20 – 2015-07-23 (×7): 200 mg via ORAL
  Filled 2015-07-20 (×7): qty 2

## 2015-07-20 NOTE — ED Notes (Signed)
Report called to Gina, RN

## 2015-07-20 NOTE — H&P (Signed)
[facility] [place] NAME: [name]    MR#:  [id number]  DATE OF BIRTH:  06-19-1927  DATE OF ADMISSION:  07/20/2015  PRIMARY CARE PHYSICIAN: Lorelee Market, MD   REQUESTING/REFERRING PHYSICIAN: Dr.kevin Paduchowski  CHIEF COMPLAINT: Generalized weakness    Chief Complaint  Patient presents with  . Fatigue    HISTORY OF PRESENT ILLNESS:  [name]  is a 82 y.o. male with a known history of hypertension, hyperlipidemia, chronic kidney disease, brought in by the family because of generalized weakness and patient adding progressively weak for the past 3 days. Recently was given Bactrim for UTI for 5 days. Patient started it on Friday evening took it for 4 days until yesterday evening. The generalized weakness and wife is assisting him even to get up and stand and walk. Noted to have worsening of creatinine up to 5, severe hypokalemia potassium 2.7. Patient has past medical history of chronic kidney disease stage III, also history of kidney stones before, with ESWL.   he was following up with Dr. Elnoria Howard from urology, and according to the wife he is left kidney is not working and is full of stones. Patient denies any groin pain, dysuria at this time. PAST MEDICAL HISTORY:     Past Medical History  Diagnosis Date  . Prostate cancer ([place])   . Skin cancer     right cheek  . Hypercholesteremia   . Cardiac disease   . Shingles   . Kidney failure     left  . Hypertension   . Osteoporosis   . CHF (congestive heart failure) ([place])     PAST SURGICAL HISTOIRY:   Past Surgical History  Procedure Laterality Date  . Cardiac catheterization  04/13/2014    SOCIAL HISTORY:   Social History  Substance Use Topics  . Smoking status: Former Smoker    Types: Cigarettes, Cigars  . Smokeless tobacco: Current User    Types: Chew  . Alcohol Use: No    FAMILY HISTORY:  No family history on file.  father had history of stroke  before DRUG ALLERGIES:   Allergies  Allergen Reactions  . Ativan [Lorazepam] Other (See Comments)    Hallucinations, combative  . Penicillin G Other (See Comments)    Has patient had a PCN reaction causing immediate rash, facial/tongue/throat swelling, SOB or lightheadedness with hypotension: Yes Has patient had a PCN reaction causing severe rash involving mucus membranes or skin necrosis: No Has patient had a PCN reaction that required hospitalization No Has patient had a PCN reaction occurring within the last 10 years: No If all of the above answers are "NO", then may proceed with Cephalosporin use.     REVIEW OF SYSTEMS:  CONSTITUTIONAL: No fever, fatigue or weakness.  EYES: No blurred or double vision.  EARS, NOSE, AND THROAT: No tinnitus or ear pain.  RESPIRATORY: No cough, shortness of breath, wheezing or hemoptysis.  CARDIOVASCULAR: No chest pain, orthopnea, edema.  GASTROINTESTINAL: No nausea, vomiting, diarrhea or abdominal pain.  GENITOURINARY: No dysuria, hematuria.  ENDOCRINE: No polyuria, nocturia,  HEMATOLOGY: No anemia, easy bruising or bleeding SKIN: No rash or lesion. MUSCULOSKELETAL: No joint pain or arthritis.   NEUROLOGIC: No tingling, numbness, weakness.  PSYCHIATRY: No anxiety or depression.   MEDICATIONS AT HOME:   Prior to Admission medications   Medication Sig Start Date End Date Taking? Authorizing Provider  albuterol (PROVENTIL HFA;VENTOLIN HFA) 108 (90 BASE) MCG/ACT inhaler Inhale 2 puffs into the  lungs every 6 (six) hours as needed for wheezing or shortness of breath.   Yes Historical Provider, MD  aspirin EC 81 MG tablet Take 81 mg by mouth at bedtime.   Yes Historical Provider, MD  atorvastatin (LIPITOR) 40 MG tablet Take 40 mg by mouth at bedtime.    Yes Historical Provider, MD  Cholecalciferol (VITAMIN D3) 5000 units TABS Take 5,000 Units by mouth daily.   Yes Historical Provider, MD  docusate sodium (COLACE) 100 MG capsule Take 2 capsules (200  mg total) by mouth 2 (two) times daily. 07/12/15  Yes Carrie Mew, MD  doxazosin (CARDURA) 8 MG tablet Take 8 mg by mouth at bedtime.    Yes Historical Provider, MD  ferrous sulfate 325 (65 FE) MG tablet Take 325 mg by mouth 2 (two) times daily.    Yes Historical Provider, MD  hydrALAZINE (APRESOLINE) 25 MG tablet Take 1 tablet by mouth 2 (two) times daily. 10/14/14  Yes Historical Provider, MD  levothyroxine (SYNTHROID, LEVOTHROID) 100 MCG tablet Take 100 mcg by mouth daily.   Yes Historical Provider, MD  losartan (COZAAR) 100 MG tablet Take 100 mg by mouth daily.   Yes Historical Provider, MD  metoprolol succinate (TOPROL-XL) 50 MG 24 hr tablet Take 50 mg by mouth daily.    Yes Historical Provider, MD  nitroGLYCERIN (NITROSTAT) 0.4 MG SL tablet Place 0.4 mg under the tongue every 5 (five) minutes as needed for chest pain.   Yes Historical Provider, MD  omega-3 acid ethyl esters (LOVAZA) 1 G capsule Take 1 capsule by mouth 2 (two) times daily.   Yes Historical Provider, MD  pantoprazole (PROTONIX) 40 MG tablet Take 40 mg by mouth 2 (two) times daily.    Yes Historical Provider, MD  polyethylene glycol powder (GLYCOLAX/MIRALAX) powder 2 cap fulls in a full glass of water, three times a day, for 5 days. 07/12/15  Yes Carrie Mew, MD  potassium chloride (K-DUR) 10 MEQ tablet Take 10 mEq by mouth daily.   Yes Historical Provider, MD  senna (SENOKOT) 8.6 MG TABS tablet Take 2 tablets (17.2 mg total) by mouth 2 (two) times daily. 07/12/15  Yes Carrie Mew, MD  sucralfate (CARAFATE) 1 g tablet Take 1 g by mouth 4 (four) times daily -  with meals and at bedtime.   Yes Historical Provider, MD  sulfamethoxazole-trimethoprim (BACTRIM DS,SEPTRA DS) 800-160 MG tablet Take 1 tablet by mouth 2 (two) times daily.   Yes Historical Provider, MD  tamsulosin (FLOMAX) 0.4 MG CAPS capsule Take 0.4 mg by mouth daily.   Yes Historical Provider, MD  torsemide (DEMADEX) 20 MG tablet Take 20 mg by mouth daily.   Yes  Historical Provider, MD      VITAL SIGNS:  Blood pressure 136/78, pulse 83, temperature 98.2 F (36.8 C), temperature source Oral, resp. rate 18, height 5\' 8"  (1.727 m), weight 81.647 kg (180 lb), SpO2 98 %.  PHYSICAL EXAMINATION:  GENERAL:  82 y.o.-year-old patient lying in the bed with no acute distress.  EYES: Pupils equal, round, reactive to light and accommodation. No scleral icterus. Extraocular muscles intact.  HEENT: Head atraumatic, normocephalic. Oropharynx and nasopharynx clear.  NECK:  Supple, no jugular venous distention. No thyroid enlargement, no tenderness.  LUNGS: Normal breath sounds bilaterally, no wheezing, rales,rhonchi or crepitation. No use of accessory muscles of respiration.  CARDIOVASCULAR: S1, S2 normal. No murmurs, rubs, or gallops.  ABDOMEN: Soft, nontender, nondistended. Bowel sounds present. No organomegaly or mass.  EXTREMITIES: No pedal edema, cyanosis,  or clubbing.  NEUROLOGIC: Cranial nerves II through XII are intact. Muscle strength 5/5 in all extremities. Sensation intact. Gait not checked.  PSYCHIATRIC: The patient is alert and oriented x 3.  SKIN: No obvious rash, lesion, or ulcer.   LABORATORY PANEL:   CBC  Recent Labs Lab 07/20/15 1019  WBC 4.5  HGB 10.6*  HCT 31.4*  PLT 197   ------------------------------------------------------------------------------------------------------------------  Chemistries   Recent Labs Lab 07/20/15 1019  NA 135  K 2.7*  CL 101  CO2 22  GLUCOSE 105*  BUN 60*  CREATININE 5.31*  CALCIUM 8.3*  AST 26  ALT 22  ALKPHOS 88  BILITOT 0.4   ------------------------------------------------------------------------------------------------------------------  Cardiac Enzymes No results for input(s): TROPONINI in the last 168 hours. ------------------------------------------------------------------------------------------------------------------  RADIOLOGY:  No results found.  EKG:   Orders placed  or performed during the hospital encounter of 07/12/15  . EKG 12-Lead  . EKG 12-Lead  . ED EKG within 10 minutes  . ED EKG within 10 minutes  . EKG    IMPRESSION AND PLAN:   #1. Acute on chronic renal failure, chronic kidney disease stage III: Acute renal failure secondary to likely Bactrim induced: Continue IV hydration with normal saline, discontinue Bactrim., Discontinue torsemide at this time. Check renal ultrasound, monitor urine output. #2: Severe hypokalemia: Due to diuretics, decreased by mouth intake: Replace the potassium and IV fluids, recheck it. Monitored on  Off UNIT telemetry. #3 chronically obstructed left kidney since 2008: Follows up with Dr. Elnoria Howard, at this time no groin pain., Consult nephrology because of his renal failure. #4 history of recent diverticular bleed stopped on itself, followed up with Dr. Anastasio Champion. #5 hypertension; controlled continue metoprolol. #6 history of for prostate cancer status post radiation therapy. 7.Generalized weakness continue physical therapy evaluation.   All the records are reviewed and case discussed with ED provider. Management plans discussed with the patient, family and they are in agreement.  CODE STATUS: full  TOTAL TIME TAKING CARE OF THIS PATIENT: 55 minutes.    Epifanio Lesches M.D on 07/20/2015 at 12:14 PM  Between 7am to 6pm - Pager - [phone]  After 6pm go to [email or web address] - password [email or web address]  Office  [phone]  CC: Primary care physician; Lorelee Market, MD  Note: This dictation was prepared with Dragon dictation along with smaller phrase technology. Any transcriptional errors that result from this process are unintentional.

## 2015-07-20 NOTE — Progress Notes (Signed)
Pt came to floor at 1400. VSS. Pt was oriented to room and safety plan.

## 2015-07-20 NOTE — Progress Notes (Signed)
Patient renal function is <73ml/min and lovenox was ordered. Per protocol lovenox will be discontinued and patient will be transitioned to heparin 5000 units tid.  Ramond Dial, Pharm.D Clinical Pharmacist

## 2015-07-20 NOTE — ED Notes (Signed)
Called by Dr Tamsen Meek [facility] for abnormal lab work.  Stated kidney function had dropped.  Advised to come to ED

## 2015-07-20 NOTE — ED Provider Notes (Signed)
[facility] Emergency Department Provider Note  Time seen: 10:16 AM  I have reviewed the triage vital signs and the nursing notes.   HISTORY  Chief Complaint Fatigue    HPI [name] is a 82 y.o. male with a past medical history of hyperlipidemia, kidney disease, hypertension, CHF, who presents the emergency department with decreased kidney function. According to the patient he was seen by his primary care physician on Friday (5 days ago) for some right sided abdominal pain. He had an ultrasound performed, CT scan performed, and was prescribed Bactrim (the wife and patient are not clear why). Patient has been feeling increasingly weak over the past 3 days so they went back to their primary care physician yesterday had an examination blood work performed, was called back stating that he needed to go to the emergency department this morning for evaluation as his kidney function had declined. Patient has a history of chronic kidney disease. Denies any dysuria. Does state he continues to have intermittent right sided abdominal pain but denies any currently. Describes his generalized weakness as significant, the wife states she has to assist him to stand up and walk which is abnormal for the patient.     Past Medical History  Diagnosis Date  . Prostate cancer ([place])   . Skin cancer     right cheek  . Hypercholesteremia   . Cardiac disease   . Shingles   . Kidney failure     left  . Hypertension   . Osteoporosis   . CHF (congestive heart failure) [facility])     Patient Active Problem List   Diagnosis Date Noted  . GIB (gastrointestinal bleeding) 06/03/2015    Past Surgical History  Procedure Laterality Date  . Cardiac catheterization  04/13/2014    Current Outpatient Rx  Name  Route  Sig  Dispense  Refill  . albuterol (PROVENTIL HFA;VENTOLIN HFA) 108 (90 BASE) MCG/ACT inhaler   Inhalation   Inhale 2 puffs into the lungs every 6 (six) hours as needed for  wheezing or shortness of breath.         Marland Kitchen aspirin EC 81 MG tablet   Oral   Take 81 mg by mouth at bedtime.         Marland Kitchen atorvastatin (LIPITOR) 40 MG tablet   Oral   Take 40 mg by mouth at bedtime.          . Cholecalciferol (VITAMIN D3) 5000 units TABS   Oral   Take 5,000 Units by mouth daily.         Marland Kitchen docusate sodium (COLACE) 100 MG capsule   Oral   Take 2 capsules (200 mg total) by mouth 2 (two) times daily.   120 capsule   0   . doxazosin (CARDURA) 8 MG tablet   Oral   Take 8 mg by mouth at bedtime.          . ferrous sulfate 325 (65 FE) MG tablet   Oral   Take 325 mg by mouth 2 (two) times daily.          . hydrALAZINE (APRESOLINE) 25 MG tablet   Oral   Take 1 tablet by mouth 2 (two) times daily.         Marland Kitchen levothyroxine (SYNTHROID, LEVOTHROID) 100 MCG tablet   Oral   Take 100 mcg by mouth daily.         Marland Kitchen losartan (COZAAR) 100 MG tablet   Oral  Take 100 mg by mouth daily.         . metoprolol succinate (TOPROL-XL) 50 MG 24 hr tablet   Oral   Take 50 mg by mouth daily.          . nitroGLYCERIN (NITROSTAT) 0.4 MG SL tablet   Sublingual   Place 0.4 mg under the tongue every 5 (five) minutes as needed for chest pain.         Marland Kitchen omega-3 acid ethyl esters (LOVAZA) 1 G capsule   Oral   Take 1 capsule by mouth 2 (two) times daily.         . pantoprazole (PROTONIX) 40 MG tablet   Oral   Take 40 mg by mouth 2 (two) times daily.          . polyethylene glycol powder (GLYCOLAX/MIRALAX) powder      2 cap fulls in a full glass of water, three times a day, for 5 days.   255 g   0   . potassium chloride (K-DUR) 10 MEQ tablet   Oral   Take 10 mEq by mouth daily.         Marland Kitchen senna (SENOKOT) 8.6 MG TABS tablet   Oral   Take 2 tablets (17.2 mg total) by mouth 2 (two) times daily.   120 each   0   . torsemide (DEMADEX) 20 MG tablet   Oral   Take 20 mg by mouth daily.           Allergies Ativan and Penicillin g  No family  history on file.  Social History Social History  Substance Use Topics  . Smoking status: Former Smoker    Types: Cigarettes, Cigars  . Smokeless tobacco: Current User    Types: Chew  . Alcohol Use: No    Review of Systems Constitutional: Negative for fever. Cardiovascular: Negative for chest pain. Respiratory: Negative for shortness of breath. Gastrointestinal: Intermittent right-sided abdominal pain. Negative for nausea, vomiting, diarrhea. Genitourinary: Negative for dysuria. Neurological: Negative for headache 10-point ROS otherwise negative.  ____________________________________________   PHYSICAL EXAM:  VITAL SIGNS: ED Triage Vitals  Enc Vitals Group     BP 07/20/15 1004 143/63 mmHg     Pulse Rate 07/20/15 1004 72     Resp 07/20/15 1004 18     Temp 07/20/15 1004 98.2 F (36.8 C)     Temp Source 07/20/15 1004 Oral     SpO2 07/20/15 1004 95 %     Weight 07/20/15 1004 180 lb (81.647 kg)     Height 07/20/15 1004 5\' 8"  (1.727 m)     Head Cir --      Peak Flow --      Pain Score 07/20/15 1004 0     Pain Loc --      Pain Edu? --      Excl. in [place]? --     Constitutional: Alert and oriented. Well appearing and in no distress. Eyes: Normal exam ENT   Head: Normocephalic and atraumatic   Mouth/Throat: Mucous membranes are moist. Cardiovascular: Normal rate, regular rhythm. No murmur Respiratory: Normal respiratory effort without tachypnea nor retractions. Breath sounds are clear and equal bilaterally. No wheezes/rales/rhonchi. Gastrointestinal: Soft and nontender. No distention.  Musculoskeletal: Nontender with normal range of motion in all extremities.  Neurologic:  Normal speech and language. No gross focal neurologic deficits  Skin:  Skin is warm, dry and intact.  Psychiatric: Mood and affect are normal. Speech and behavior are normal.  ____________________________________________    INITIAL IMPRESSION / ASSESSMENT AND PLAN / ED COURSE  Pertinent  labs & imaging results that were available during my care of the patient were reviewed by me and considered in my medical decision making (see chart for details).  Patient presents for generalized weakness with abnormal lab values by the primary care physician. They were told that his kidney function had declined. We will recheck labs in the emergency department, and closely monitor. The patient appears well, no acute distress, no current complaints.  Patient's creatinine has gone from 2-5.1. We'll admit the patient for acute renal failure which could be due to the patient's recent use of Bactrim.  ____________________________________________   FINAL CLINICAL IMPRESSION(S) / ED DIAGNOSES  Generalized weakness Acute renal failure   Harvest Dark, MD 07/20/15 1118

## 2015-07-21 ENCOUNTER — Inpatient Hospital Stay: Payer: Medicare Other

## 2015-07-21 LAB — RENAL FUNCTION PANEL
ALBUMIN: 2.3 g/dL — AB (ref 3.5–5.0)
Anion gap: 6 (ref 5–15)
BUN: 53 mg/dL — ABNORMAL HIGH (ref 6–20)
CALCIUM: 7.7 mg/dL — AB (ref 8.9–10.3)
CO2: 21 mmol/L — AB (ref 22–32)
CREATININE: 4.68 mg/dL — AB (ref 0.61–1.24)
Chloride: 108 mmol/L (ref 101–111)
GFR calc Af Amer: 12 mL/min — ABNORMAL LOW (ref >60)
GFR, EST NON AFRICAN AMERICAN: 10 mL/min — AB (ref >60)
Glucose, Bld: 84 mg/dL (ref 65–99)
PHOSPHORUS: 3.5 mg/dL (ref 2.5–4.6)
Potassium: 3 mmol/L — ABNORMAL LOW (ref 3.5–5.1)
Sodium: 135 mmol/L (ref 135–145)

## 2015-07-21 LAB — BASIC METABOLIC PANEL
ANION GAP: 6 (ref 5–15)
Anion gap: 6 (ref 5–15)
BUN: 53 mg/dL — AB (ref 6–20)
BUN: 50 mg/dL — ABNORMAL HIGH (ref 6–20)
CALCIUM: 7.9 mg/dL — AB (ref 8.9–10.3)
CO2: 22 mmol/L (ref 22–32)
CO2: 21 mmol/L — ABNORMAL LOW (ref 22–32)
CREATININE: 4.71 mg/dL — AB (ref 0.61–1.24)
CREATININE: 4.35 mg/dL — AB (ref 0.61–1.24)
Calcium: 7.8 mg/dL — ABNORMAL LOW (ref 8.9–10.3)
Chloride: 107 mmol/L (ref 101–111)
Chloride: 106 mmol/L (ref 101–111)
GFR calc Af Amer: 12 mL/min — ABNORMAL LOW (ref >60)
GFR, EST AFRICAN AMERICAN: 13 mL/min — AB (ref >60)
GFR, EST NON AFRICAN AMERICAN: 10 mL/min — AB (ref >60)
GFR, EST NON AFRICAN AMERICAN: 11 mL/min — AB (ref >60)
Glucose, Bld: 87 mg/dL (ref 65–99)
Glucose, Bld: 99 mg/dL (ref 65–99)
Potassium: 3 mmol/L — ABNORMAL LOW (ref 3.5–5.1)
Potassium: 3.2 mmol/L — ABNORMAL LOW (ref 3.5–5.1)
SODIUM: 135 mmol/L (ref 135–145)
SODIUM: 133 mmol/L — AB (ref 135–145)

## 2015-07-21 LAB — URINALYSIS COMPLETE WITH MICROSCOPIC (ARMC ONLY)
Bacteria, UA: NONE SEEN
Bilirubin Urine: NEGATIVE
GLUCOSE, UA: NEGATIVE mg/dL
Ketones, ur: NEGATIVE mg/dL
LEUKOCYTES UA: NEGATIVE
NITRITE: NEGATIVE
PH: 6 (ref 5.0–8.0)
PROTEIN: NEGATIVE mg/dL
RBC / HPF: NONE SEEN RBC/hpf (ref 0–5)
Specific Gravity, Urine: 1.009 (ref 1.005–1.030)
Squamous Epithelial / LPF: NONE SEEN

## 2015-07-21 LAB — CBC
HCT: 26.8 % — ABNORMAL LOW (ref 40.0–52.0)
Hemoglobin: 8.9 g/dL — ABNORMAL LOW (ref 13.0–18.0)
MCH: 28.1 pg (ref 26.0–34.0)
MCHC: 33.3 g/dL (ref 32.0–36.0)
MCV: 84.4 fL (ref 80.0–100.0)
PLATELETS: 156 10*3/uL (ref 150–440)
RBC: 3.18 MIL/uL — ABNORMAL LOW (ref 4.40–5.90)
RDW: 16.1 % — AB (ref 11.5–14.5)
WBC: 3 10*3/uL — AB (ref 3.8–10.6)

## 2015-07-21 LAB — MAGNESIUM: MAGNESIUM: 2.1 mg/dL (ref 1.7–2.4)

## 2015-07-21 NOTE — NC FL2 (Signed)
[place] LEVEL OF CARE SCREENING TOOL     IDENTIFICATION  Patient Name: [name] Birthdate: Dec 21, 1926 Sex: male Admission Date (Current Location): 07/20/2015  [facility] and [place] Number:  Engineering geologist and Address:  [facility], [address], [place], [place] [address]      Provider Number: [id number]  Attending Physician Name and Address:  Theodoro Grist, MD  Relative Name and Phone Number:       Current Level of Care: Hospital Recommended Level of Care: [place] Prior Approval Number:    Date Approved/Denied:   PASRR Number:    Discharge Plan: SNF    Current Diagnoses: Patient Active Problem List   Diagnosis Date Noted  . Acute on chronic renal failure ([place]) 07/20/2015  . GIB (gastrointestinal bleeding) 06/03/2015    Orientation RESPIRATION BLADDER Height & Weight    Self, Time, Situation, Place  Normal Continent   182 lbs.  BEHAVIORAL SYMPTOMS/MOOD NEUROLOGICAL BOWEL NUTRITION STATUS   (none)  (none) Continent Diet  AMBULATORY STATUS COMMUNICATION OF NEEDS Skin   Limited Assist Verbally Normal                       Personal Care Assistance Level of Assistance  Bathing, Dressing Bathing Assistance: Limited assistance   Dressing Assistance: Limited assistance     Functional Limitations Info             SPECIAL CARE FACTORS FREQUENCY  PT (By licensed PT)                    Contractures      Additional Factors Info  Code Status, Allergies Code Status Info: Full Allergies Info: ativan; pcn g           Current Medications (07/21/2015):  This is the current hospital active medication list Current Facility-Administered Medications  Medication Dose Route Frequency Provider Last Rate Last Dose  . 0.9 % NaCl with KCl 20 mEq/ L  infusion   Intravenous Continuous Epifanio Lesches, MD 100 mL/hr at 07/21/15 0236    . albuterol (PROVENTIL) (2.5 MG/3ML) 0.083%  nebulizer solution 2.5 mg  2.5 mg Nebulization Q6H PRN Epifanio Lesches, MD      . aspirin EC tablet 81 mg  81 mg Oral QHS Epifanio Lesches, MD   81 mg at 07/20/15 2211  . atorvastatin (LIPITOR) tablet 40 mg  40 mg Oral QHS Epifanio Lesches, MD   40 mg at 07/20/15 2206  . cholecalciferol (VITAMIN D) tablet 5,000 Units  5,000 Units Oral Daily Epifanio Lesches, MD   5,000 Units at 07/21/15 0853  . docusate sodium (COLACE) capsule 200 mg  200 mg Oral BID Epifanio Lesches, MD   200 mg at 07/21/15 1000  . doxazosin (CARDURA) tablet 8 mg  8 mg Oral QHS Epifanio Lesches, MD   8 mg at 07/20/15 2207  . ferrous sulfate tablet 325 mg  325 mg Oral BID Epifanio Lesches, MD   325 mg at 07/21/15 0853  . heparin injection 5,000 Units  5,000 Units Subcutaneous 3 times per day Epifanio Lesches, MD   5,000 Units at 07/21/15 0706  . hydrALAZINE (APRESOLINE) tablet 25 mg  25 mg Oral BID Epifanio Lesches, MD   25 mg at 07/21/15 0858  . levothyroxine (SYNTHROID, LEVOTHROID) tablet 100 mcg  100 mcg Oral QAC breakfast Epifanio Lesches, MD   100 mcg at 07/21/15 0853  . metoprolol succinate (TOPROL-XL) 24 hr tablet  50 mg  50 mg Oral Daily Epifanio Lesches, MD   50 mg at 07/21/15 0858  . omega-3 acid ethyl esters (LOVAZA) capsule 1 g  1 capsule Oral BID Epifanio Lesches, MD   1 g at 07/21/15 0853  . sucralfate (CARAFATE) tablet 1 g  1 g Oral TID WC & HS Epifanio Lesches, MD   1 g at 07/21/15 [id number]     Discharge Medications: Please see discharge summary for a list of discharge medications.  Relevant Imaging Results:  Relevant Lab Results:   Additional Information SS: [id number]  Shela Leff, LCSW

## 2015-07-21 NOTE — Progress Notes (Signed)
[name] at [place] NAME: [name]    MR#:  [id number]  DATE OF BIRTH:  [birth year]  SUBJECTIVE:  CHIEF COMPLAINT:   Chief Complaint  Patient presents with  . Fatigue   patient is a 82 year old Caucasian male who presents to the hospital with complaints of generalized weakness. On arrival to the hospital patient was noted to have elevated creatinine level to 5 and low potassium level of 2.7, CT scan of abdomen and pelvis revealed chronically obstructed left kidney since 2008, otherwise unremarkable. Patient feels a little bit better today. He is receiving IV fluids and producing some urine . Kidney function as well as creatinine has improved . Patient denies any urinary retention , but his wife tells me that intermittently he has difficulty voiding . He is receiving Lupron . Ultrasound of kidneys is pending .   Bladder Scan was unremarkable , no retention was noted   Review of Systems  Unable to perform ROS: medical condition    VITAL SIGNS: Blood pressure 139/60, pulse 126, temperature 97.4 F (36.3 C), temperature source Oral, resp. rate 17, height 5\' 8"  (1.727 m), weight 82.645 kg (182 lb 3.2 oz), SpO2 96 %.  PHYSICAL EXAMINATION:   GENERAL:  82 y.o.-year-old patient lying in the bed with no acute distress.  EYES: Pupils equal, round, reactive to light and accommodation. No scleral icterus. Extraocular muscles intact.  HEENT: Head atraumatic, normocephalic. Oropharynx and nasopharynx clear.  NECK:  Supple, no jugular venous distention. No thyroid enlargement, no tenderness.  LUNGS: Normal breath sounds bilaterally, no wheezing, rales,rhonchi or crepitation. No use of accessory muscles of respiration.  CARDIOVASCULAR: S1, S2 normal. No murmurs, rubs, or gallops.  ABDOMEN: Soft, nontender, nondistended. Bowel sounds present. No organomegaly or mass.  EXTREMITIES: No pedal edema, cyanosis, or clubbing.  NEUROLOGIC: Cranial nerves  II through XII are intact. Muscle strength 5/5 in all extremities. Sensation intact. Gait not checked.  PSYCHIATRIC: The patient is alert and oriented x 3.  SKIN: No obvious rash, lesion, or ulcer.   ORDERS/RESULTS REVIEWED:   CBC  Recent Labs Lab 07/20/15 1019 07/21/15 0404  WBC 4.5 3.0*  HGB 10.6* 8.9*  HCT 31.4* 26.8*  PLT 197 156  MCV 85.5 84.4  MCH 28.9 28.1  MCHC 33.8 33.3  RDW 16.3* 16.1*   ------------------------------------------------------------------------------------------------------------------  Chemistries   Recent Labs Lab 07/20/15 1019 07/21/15 0404  NA 135 135  135  K 2.7* 3.0*  3.0*  CL 101 108  107  CO2 22 21*  22  GLUCOSE 105* 84  87  BUN 60* 53*  53*  CREATININE 5.31* 4.68*  4.71*  CALCIUM 8.3* 7.7*  7.8*  AST 26  --   ALT 22  --   ALKPHOS 88  --   BILITOT 0.4  --    ------------------------------------------------------------------------------------------------------------------ estimated creatinine clearance is 11.8 mL/min (by C-G formula based on Cr of 4.71). ------------------------------------------------------------------------------------------------------------------ No results for input(s): TSH, T4TOTAL, T3FREE, THYROIDAB in the last 72 hours.  Invalid input(s): FREET3  Cardiac Enzymes No results for input(s): CKMB, TROPONINI, MYOGLOBIN in the last 168 hours.  Invalid input(s): CK ------------------------------------------------------------------------------------------------------------------ Invalid input(s): POCBNP ---------------------------------------------------------------------------------------------------------------  RADIOLOGY: No results found.  EKG:  Orders placed or performed during the hospital encounter of 07/12/15  . EKG 12-Lead  . EKG 12-Lead  . ED EKG within 10 minutes  . ED EKG within 10 minutes  . EKG    ASSESSMENT AND PLAN:  Active Problems:  Acute on chronic renal failure (HCC) 1.  Acute on chronic renal failure, CK D stage III, likely multifactorial due to medications, hydration issues as well as chronic bladder outlet obstruction, suspected per history. Continue IV fluids. Following kidney function closely, nephrologist input is appreciated 2. Hypokalemia , continue intravenously. Follow Potassium level later today , check magnesium level   3. Generalized weakness, with physical therapist involved for further recommendations 4. Essential hypertension, patient's blood pressure is well controlled at present, on metoprolol as well as hydralazine, not on ARB 5. Bladder outlet obstruction, continue Cardura, follow clinically. Bladder scan was unremarkable.   Management plans discussed with the patient, family and they are in agreement.   DRUG ALLERGIES:  Allergies  Allergen Reactions  . Ativan [Lorazepam] Other (See Comments)    Hallucinations, combative  . Penicillin G Other (See Comments)    Has patient had a PCN reaction causing immediate rash, facial/tongue/throat swelling, SOB or lightheadedness with hypotension: Yes Has patient had a PCN reaction causing severe rash involving mucus membranes or skin necrosis: No Has patient had a PCN reaction that required hospitalization No Has patient had a PCN reaction occurring within the last 10 years: No If all of the above answers are "NO", then may proceed with Cephalosporin use.     CODE STATUS:     Code Status Orders        Start     Ordered   07/20/15 1159  Full code   Continuous     07/20/15 1200    Code Status History    Date Active Date Inactive Code Status Order ID Comments User Context   06/03/2015  3:17 PM 06/07/2015 12:18 PM Full Code [id number]  Bettey Costa, MD Inpatient    Advance Directive Documentation        Most Recent Value   Type of Advance Directive  Living will   Pre-existing out of facility DNR order (yellow form or pink MOST form)     "MOST" Form in Place?        TOTAL TIME TAKING  CARE OF THIS PATIENT: 45 minutes.  Discussed with patient's wife and Dr. Candiss Norse for about 10-15 minutes   Irwin Toran M.D on 07/21/2015 at 1:53 PM  Between 7am to 6pm - Pager - [phone]  After 6pm go to [email or web address] - password [email or web address] [place] [facility]  Office  [phone]  CC: Primary care physician; Lorelee Market, MD

## 2015-07-21 NOTE — Progress Notes (Signed)
Initial Nutrition Assessment   INTERVENTION:   Meals and Snacks: Cater to patient preferences Medical Food Supplement Therapy: will recommend on follow if intake poor   NUTRITION DIAGNOSIS:   No nutrition diagnosis at this time  GOAL:   Patient will meet greater than or equal to 90% of their needs  MONITOR:    (Energy Intake, Electrolyte and Renal Profile, Anthropometrics)  REASON FOR ASSESSMENT:   Diagnosis    ASSESSMENT:   Pt admitted with acute on chronic renal failure with abnormal electrolytes.  Past Medical History  Diagnosis Date  . Prostate cancer ([place])   . Skin cancer     right cheek  . Hypercholesteremia   . Cardiac disease   . Shingles   . Kidney failure     left  . Hypertension   . Osteoporosis   . CHF (congestive heart failure) ([place])      Diet Order:  Diet Heart Room service appropriate?: Yes; Fluid consistency:: Thin    Current Nutrition: Pt reports eating a good breakfast this am, wife reports eating 'part' of breakfast. Pt reports good appetite at this time.  Food/Nutrition-Related History: Pt reports appetite has been doing well PTA. Pt's wife reports pt usually eats oatmeal with milk and cinnamon, slice of cheese, 2 slices of bacon and scrambled eggs in the morning around 11am, then he has a snack mid-afternoon and a balanced supper before bed.   Scheduled Medications:  . aspirin EC  81 mg Oral QHS  . atorvastatin  40 mg Oral QHS  . cholecalciferol  5,000 Units Oral Daily  . docusate sodium  200 mg Oral BID  . doxazosin  8 mg Oral QHS  . ferrous sulfate  325 mg Oral BID  . heparin subcutaneous  5,000 Units Subcutaneous 3 times per day  . hydrALAZINE  25 mg Oral BID  . levothyroxine  100 mcg Oral QAC breakfast  . metoprolol succinate  50 mg Oral Daily  . omega-3 acid ethyl esters  1 capsule Oral BID  . sucralfate  1 g Oral TID WC & HS    Continuous Medications:  . 0.9 % NaCl with KCl 20 mEq / L 100 mL/hr at 07/21/15 0236      Electrolyte/Renal Profile and Glucose Profile:   Recent Labs Lab 07/20/15 1019 07/21/15 0404  NA 135 135  135  K 2.7* 3.0*  3.0*  CL 101 108  107  CO2 22 21*  22  BUN 60* 53*  53*  CREATININE 5.31* 4.68*  4.71*  CALCIUM 8.3* 7.7*  7.8*  PHOS  --  3.5  GLUCOSE 105* 84  87   Protein Profile:  Recent Labs Lab 07/20/15 1019 07/21/15 0404  ALBUMIN 2.9* 2.3*    Gastrointestinal Profile: Last BM: 07/21/2015 noted black, soft stool    Weight Change: Pt's wife reports some weight loss but nothing drastic. Per [facility] encounters pt weight relatively stable, with some weight gain in the past couple of months.   Height:   Ht Readings from Last 1 Encounters:  07/20/15 5\' 8"  (1.727 m)    Weight:   Wt Readings from Last 1 Encounters:  07/20/15 182 lb 3.2 oz (82.645 kg)     Wt Readings from Last 10 Encounters:  07/20/15 182 lb 3.2 oz (82.645 kg)  07/12/15 180 lb (81.647 kg)  06/03/15 178 lb 12.8 oz (81.103 kg)  11/10/14 181 lb 14.1 oz (82.5 kg)    BMI:  Body mass index is 27.71 kg/(m^2).  EDUCATION NEEDS:   No education needs identified at this time   [place], RD, LDN Pager [phone] Weekend/On-Call Pager [phone]

## 2015-07-21 NOTE — Clinical Social Work Note (Signed)
Clinical Social Work Assessment  Patient Details  Name: [name] MRN: [id number] Date of Birth: 07-10-1926  Date of referral:  07/21/15               Reason for consult:  Facility Placement                Permission sought to share information with:  Facility Sport and exercise psychologist, Family Supports Permission granted to share information::  Yes, Verbal Permission Granted  Name::        Agency::     Relationship::     Contact Information:     Housing/Transportation Living arrangements for the past 2 months:  Single Family Home Source of Information:  Patient, Spouse Patient Interpreter Needed:  None Criminal Activity/Legal Involvement Pertinent to Current Situation/Hospitalization:  No - Comment as needed Significant Relationships:  Adult Children, Spouse Lives with:  Spouse Do you feel safe going back to the place where you live?    Need for family participation in patient care:     Care giving concerns:  Patient resides at home with his wife.   Social Worker assessment / plan:  CSW informed by RN CM that PT verbally informed her that they are recommending STR. CSW spoke with patient and his wife this afternoon about this. Patient is not interested in pursuing STR but stated I could begin a bedsearch. Patient stated it was doubtful he will go. Patient stated that his balance is poor because he is having to maneuver around his IV pole and lines attached. Patient is frustrated this afternoon. Patient's wife insisted that it will be patient's decision as to whether he returns home or go to STR. Patient's son was present in the room as well but did not say anything. Patient was frustrated that PT was consulted to see him and the only time patient's son spoke up was to tell him that they did not ask for the PT consult. Bedsearch to be initiated.  Employment status:  Retired Forensic scientist:  Medicare PT Recommendations:  [employer] / Referral to  community resources:  [place]  Patient/Family's Response to care:  Patient frustrated at recommendation for [facility].  Patient/Family's Understanding of and Emotional Response to Diagnosis, Current Treatment, and Prognosis:  Patient believes his physical limitations have to do with the medical equipment he is attached to and not his true ability.  Emotional Assessment Appearance:  Appears stated age Attitude/Demeanor/Rapport:  Angry, Complaining Affect (typically observed):  Frustrated Orientation:  Oriented to Self, Oriented to Place, Oriented to  Time, Oriented to Situation Alcohol / Substance use:  Not Applicable Psych involvement (Current and /or in the community):  No (Comment)  Discharge Needs  Concerns to be addressed:  Care Coordination Readmission within the last 30 days:  No Current discharge risk:  None Barriers to Discharge:  No Barriers Identified   Shela Leff, LCSW 07/21/2015, 3:43 PM

## 2015-07-21 NOTE — Evaluation (Signed)
Physical Therapy Evaluation Patient Details Name: [name] MRN: [id number] DOB: [birth year] Today's Date: 07/21/2015   History of Present Illness  presented to ER secondary to progressive, generalized weakness x3 days; admitted with acute/chronic renal failure (noted with elevated creatinine and hypokalemia)  Clinical Impression  Upon evaluation, patient alert and oriented to basic information; follows simple commands, but generally impulsive with all functional mobility.  Slightly agitated with requested participation, but agreeable to complete evaluation.  Bilat UE/LE strength and ROM grossly symmetrical and WFL for basic transfers/mobility.  Significant balance deficits noted with standing and mobility efforts; consistently requiring min/mod assist from therapist for balance recovery.  Multiple lateral LOB (consistent with turns/changes of direction) which patient unable to self-recover at this time.  Very unsteady and unsafe; high fall risk. Would benefit from skilled PT to address above deficits and promote optimal return to PLOF; recommend transition to STR upon discharge from acute hospitalization.     Follow Up Recommendations SNF    Equipment Recommendations  Rolling walker with 5" wheels    Recommendations for Other Services       Precautions / Restrictions Precautions Precautions: Fall Restrictions Weight Bearing Restrictions: No      Mobility  Bed Mobility Overal bed mobility: Needs Assistance Bed Mobility: Supine to Sit     Supine to sit: Supervision        Transfers Overall transfer level: Needs assistance Equipment used: Rolling walker (2 wheeled) Transfers: Sit to/from Stand Sit to Stand: Min assist            Ambulation/Gait Ambulation/Gait assistance: Min assist;Mod assist Ambulation Distance (Feet): 80 Feet Assistive device: Rolling walker (2 wheeled)       General Gait Details: broad BOS with forward flexed posture, tends to maintain  RW arms length anterior to patient.  Inconsistent step height/length, intermittently scissoring LEs; frequent LOB (esp with turns/changes of direction) requiring physical assist from therapist to recover.  Frequently stepping outside BOS of RW (esp with turns); very high fall risk.  Stairs            Wheelchair Mobility    Modified Rankin (Stroke Patients Only)       Balance Overall balance assessment: Needs assistance Sitting-balance support: No upper extremity supported;Feet supported Sitting balance-Leahy Scale: Good     Standing balance support: Bilateral upper extremity supported Standing balance-Leahy Scale: Fair                               Pertinent Vitals/Pain Pain Assessment: No/denies pain    Home Living Family/patient expects to be discharged to:: Private residence Living Arrangements: Spouse/significant other Available Help at Discharge: Family Type of Home: House Home Access: Stairs to enter;Ramped entrance Entrance Stairs-Rails: Right Entrance Stairs-Number of Steps: 3 Home Layout: One level        Prior Function Level of Independence: Independent         Comments: Patient indep with household mobility/ADLs, though does endorse at least 2-3 falls in previous six months (able to get self up)     Hand Dominance        Extremity/Trunk Assessment   Upper Extremity Assessment: Overall WFL for tasks assessed           Lower Extremity Assessment: Overall WFL for tasks assessed         Communication   Communication: HOH  Cognition Arousal/Alertness: Awake/alert Behavior During Therapy: WFL for tasks assessed/performed;Impulsive Overall Cognitive Status: Within  Functional Limits for tasks assessed                      General Comments      Exercises        Assessment/Plan    PT Assessment Patient needs continued PT services  PT Diagnosis Difficulty walking;Generalized weakness   PT Problem List  Decreased activity tolerance;Decreased balance;Decreased mobility;Decreased coordination;Decreased cognition;Decreased knowledge of use of DME;Decreased safety awareness;Decreased knowledge of precautions  PT Treatment Interventions DME instruction;Stair training;Gait training;Functional mobility training;Therapeutic exercise;Balance training;Therapeutic activities;Patient/family education;Cognitive remediation   PT Goals (Current goals can be found in the Care Plan section) Acute Rehab PT Goals Patient Stated Goal: to return home and get some rest PT Goal Formulation: With patient/family Time For Goal Achievement: 08/04/15 Potential to Achieve Goals: Good    Frequency Min 2X/week   Barriers to discharge Decreased caregiver support;Inaccessible home environment      Co-evaluation               End of Session Equipment Utilized During Treatment: Gait belt Activity Tolerance: Patient tolerated treatment well Patient left: in bed;with call bell/phone within reach;with bed alarm set;with family/visitor present           Time: [id number] PT Time Calculation (min) (ACUTE ONLY): 16 min   Charges:   PT Evaluation $PT Eval Moderate Complexity: 1 Procedure PT Treatments $Gait Training: 8-22 mins   PT G Codes:        [name], PT, DPT, NCS 07/21/2015, 4:46 PM [phone]

## 2015-07-21 NOTE — Progress Notes (Signed)
Subjective:  Patient is known to our practice from outpatient and is followed by Dr. Juleen China for chronic kidney disease stage III. He has chronically obstructed left kidney   He presents for generalized weakness and acute renal failure Upon presentation, his creatinine was noted to be high at 5.31 Most recent creatinine is 2.19 noted on January 3 Other findings include low potassium of 2.7. Patient is very confused this morning He is not able to provide any meaningful information All information is obtained from the chart as well as patient's wife   Objective:  Vital signs in last 24 hours:  Temp:  [98.1 F (36.7 C)-98.5 F (36.9 C)] 98.4 F (36.9 C) (01/12 0953) Pulse Rate:  [65-83] 74 (01/12 0953) Resp:  [14-18] 17 (01/12 0953) BP: (104-139)/(48-78) 128/56 mmHg (01/12 0953) SpO2:  [97 %-98 %] 98 % (01/12 0953) Weight:  [82.645 kg (182 lb 3.2 oz)] 82.645 kg (182 lb 3.2 oz) (01/11 1422)  Weight change:  Filed Weights   07/20/15 1004 07/20/15 1422  Weight: 81.647 kg (180 lb) 82.645 kg (182 lb 3.2 oz)    Intake/Output:    Intake/Output Summary (Last 24 hours) at 07/21/15 1148 Last data filed at 07/21/15 0737  Gross per 24 hour  Intake   1473 ml  Output    500 ml  Net    973 ml     Physical Exam: General:  no acute distress   HEENT  anicteric sclera, moist oral mucous membranes   Neck  supple   Pulm/lungs  scattered rhonchi, normal respiratory effort   CVS/Heart  no rub or gallop   Abdomen:   soft, nontender, somewhat distended   Extremities:  no peripheral edema   Neurologic:  alert, able to follow commands, confused   Skin:  no acute rashes   Access:        Basic Metabolic Panel:   Recent Labs Lab 07/20/15 1019 07/21/15 0404  NA 135 135  135  K 2.7* 3.0*  3.0*  CL 101 108  107  CO2 22 21*  22  GLUCOSE 105* 84  87  BUN 60* 53*  53*  CREATININE 5.31* 4.68*  4.71*  CALCIUM 8.3* 7.7*  7.8*  PHOS  --  3.5     CBC:  Recent Labs Lab  07/20/15 1019 07/21/15 0404  WBC 4.5 3.0*  HGB 10.6* 8.9*  HCT 31.4* 26.8*  MCV 85.5 84.4  PLT 197 156      Microbiology:  No results found for this or any previous visit (from the past 720 hour(s)).  Coagulation Studies: No results for input(s): LABPROT, INR in the last 72 hours.  Urinalysis: No results for input(s): COLORURINE, LABSPEC, PHURINE, GLUCOSEU, HGBUR, BILIRUBINUR, KETONESUR, PROTEINUR, UROBILINOGEN, NITRITE, LEUKOCYTESUR in the last 72 hours.  Invalid input(s): APPERANCEUR    Imaging: No results found.   Medications:   . 0.9 % NaCl with KCl 20 mEq / L 100 mL/hr at 07/21/15 0236   . aspirin EC  81 mg Oral QHS  . atorvastatin  40 mg Oral QHS  . cholecalciferol  5,000 Units Oral Daily  . docusate sodium  200 mg Oral BID  . doxazosin  8 mg Oral QHS  . ferrous sulfate  325 mg Oral BID  . heparin subcutaneous  5,000 Units Subcutaneous 3 times per day  . hydrALAZINE  25 mg Oral BID  . levothyroxine  100 mcg Oral QAC breakfast  . metoprolol succinate  50 mg Oral Daily  . omega-3  acid ethyl esters  1 capsule Oral BID  . sucralfate  1 g Oral TID WC & HS  . tamsulosin  0.4 mg Oral Daily   albuterol  Assessment/ Plan:  82 y.o. male With history of coronary disease, congestive heart failure, hypertension, hyperlipidemia, hypothyroidism, anemia, chronic kidney disease  1. Acute on chronic kidney disease stage III Chronic kidney disease is likely secondary to obstructive uropathy leading to renal atrophy. Cause for acute renal failure is unclear but patient was on Bactrim and torsemide as outpatient Those medications have been held Bladder scan was negative for urinary retention this morning We will obtain a renal ultrasound   2. Hypokalemia Currently patient is getting IV replacement  3. Acute confusion versus delirium Unclear cause    LOS: 1 Eisa Necaise 1/12/201711:48 AM

## 2015-07-22 LAB — BASIC METABOLIC PANEL
Anion gap: 7 (ref 5–15)
BUN: 45 mg/dL — ABNORMAL HIGH (ref 6–20)
CALCIUM: 7.9 mg/dL — AB (ref 8.9–10.3)
CO2: 20 mmol/L — AB (ref 22–32)
CREATININE: 3.9 mg/dL — AB (ref 0.61–1.24)
Chloride: 105 mmol/L (ref 101–111)
GFR calc Af Amer: 15 mL/min — ABNORMAL LOW (ref >60)
GFR calc non Af Amer: 13 mL/min — ABNORMAL LOW (ref >60)
GLUCOSE: 99 mg/dL (ref 65–99)
Potassium: 3.1 mmol/L — ABNORMAL LOW (ref 3.5–5.1)
Sodium: 132 mmol/L — ABNORMAL LOW (ref 135–145)

## 2015-07-22 MED ORDER — HYDRALAZINE HCL 25 MG PO TABS
25.0000 mg | ORAL_TABLET | Freq: Three times a day (TID) | ORAL | Status: DC
  Administered 2015-07-22 – 2015-07-23 (×3): 25 mg via ORAL
  Filled 2015-07-22 (×3): qty 1

## 2015-07-22 MED ORDER — POTASSIUM CHLORIDE CRYS ER 20 MEQ PO TBCR
20.0000 meq | EXTENDED_RELEASE_TABLET | Freq: Two times a day (BID) | ORAL | Status: DC
  Administered 2015-07-22 – 2015-07-23 (×3): 20 meq via ORAL
  Filled 2015-07-22 (×3): qty 1

## 2015-07-22 MED ORDER — ISOSORBIDE MONONITRATE ER 30 MG PO TB24
30.0000 mg | ORAL_TABLET | Freq: Every day | ORAL | Status: DC
  Administered 2015-07-22 – 2015-07-23 (×2): 30 mg via ORAL
  Filled 2015-07-22 (×2): qty 1

## 2015-07-22 NOTE — Care Management Important Message (Signed)
Important Message  Patient Details  Name: [name] MRN: [id number] Date of Birth: Mar 23, 1927   Medicare Important Message Given:  Yes  2nd notice    Beau Fanny, RN 07/22/2015, 8:44 AM

## 2015-07-22 NOTE — Care Management Note (Signed)
Case Management Note  Patient Details  Name: [name] MRN: [id number] Date of Birth: 01/06/1927  Subjective/Objective:                 Patient admitted from home with Acute on chronic renal failure.  Patient lives with his wife.  Patient obtains his medications from [facility].  Patient states that he has a walker and cane at home, however the walker is old.  Wife transports patient to doctors appointments.  PT recommending SNF.  Patient declines, wife in agreement.  Patient agreeable to home health services.  Patient offered choice of agency and [facility].  Corene Cornea from [facility] notified.    Action/Plan: Need order for home health RN, PT, Rolling walker, and face to face  Expected Discharge Date:                  Expected Discharge Plan:     In-House Referral:     Discharge planning Services     Post Acute Care Choice:    Choice offered to:     DME Arranged:    DME Agency:     HH Arranged:    [place] Agency:     Status of Service:     Medicare Important Message Given:  Yes Date Medicare IM Given:    Medicare IM give by:    Date Additional Medicare IM Given:    Additional Medicare Important Message give by:     If discussed at [place] of Stay Meetings, dates discussed:    Additional Comments:  Beverly Sessions, RN 07/22/2015, 11:24 AM

## 2015-07-22 NOTE — Progress Notes (Signed)
Subjective:  Patient is known to our practice from outpatient and is followed by Dr. Juleen China for chronic kidney disease stage III. He has chronically obstructed left kidney   Overall doing better today Serum creatinine slightly improved to 3.90 Urine output 725 cc   Objective:  Vital signs in last 24 hours:  Temp:  [98.3 F (36.8 C)] 98.3 F (36.8 C) (01/13 1234) Pulse Rate:  [57-80] 80 (01/13 1234) Resp:  [16-17] 17 (01/13 1234) BP: (140-150)/(66-67) 150/67 mmHg (01/13 1234) SpO2:  [94 %] 94 % (01/13 1234)  Weight change:  Filed Weights   07/20/15 1004 07/20/15 1422  Weight: 81.647 kg (180 lb) 82.645 kg (182 lb 3.2 oz)    Intake/Output:    Intake/Output Summary (Last 24 hours) at 07/22/15 1343 Last data filed at 07/22/15 1228  Gross per 24 hour  Intake   2288 ml  Output    825 ml  Net   1463 ml     Physical Exam: General:  no acute distress   HEENT  anicteric sclera, moist oral mucous membranes   Neck  supple   Pulm/lungs  scattered rhonchi, normal respiratory effort   CVS/Heart  no rub or gallop   Abdomen:   soft, nontender, somewhat distended   Extremities:  no peripheral edema   Neurologic:  alert, able to follow commands, confused   Skin:  no acute rashes   Access:        Basic Metabolic Panel:   Recent Labs Lab 07/20/15 1019 07/21/15 0404 07/21/15 1423 07/22/15 0515  NA 135 135  135 133* 132*  K 2.7* 3.0*  3.0* 3.2* 3.1*  CL 101 108  107 106 105  CO2 22 21*  22 21* 20*  GLUCOSE 105* 84  87 99 99  BUN 60* 53*  53* 50* 45*  CREATININE 5.31* 4.68*  4.71* 4.35* 3.90*  CALCIUM 8.3* 7.7*  7.8* 7.9* 7.9*  MG  --   --  2.1  --   PHOS  --  3.5  --   --      CBC:  Recent Labs Lab 07/20/15 1019 07/21/15 0404  WBC 4.5 3.0*  HGB 10.6* 8.9*  HCT 31.4* 26.8*  MCV 85.5 84.4  PLT 197 156      Microbiology:  No results found for this or any previous visit (from the past 720 hour(s)).  Coagulation Studies: No results for  input(s): LABPROT, INR in the last 72 hours.  Urinalysis:  Recent Labs  07/21/15 1505  COLORURINE STRAW*  LABSPEC 1.009  PHURINE 6.0  GLUCOSEU NEGATIVE  HGBUR 1+*  BILIRUBINUR NEGATIVE  KETONESUR NEGATIVE  PROTEINUR NEGATIVE  NITRITE NEGATIVE  LEUKOCYTESUR NEGATIVE      Imaging: US Renal  07/21/2015  CLINICAL DATA:  Acute renal failure. EXAM: RENAL / URINARY TRACT ULTRASOUND COMPLETE COMPARISON:  CT abdomen and pelvis 07/12/2015 and 06/26/2007. FINDINGS: Right Kidney: Length: 11.8 cm. Echogenicity within normal limits. No mass or hydronephrosis visualized. Left Kidney: Length: 12.8 cm. Chronic left hydronephrosis is identified as seen on the prior examinations. Bladder: Appears normal for degree of bladder distention. IMPRESSION: No acute abnormality. Chronic left hydronephrosis is unchanged in appearance compared to the prior studies. Electronically Signed   By: Inge Rise M.D.   On: 07/21/2015 14:13     Medications:   . 0.9 % NaCl with KCl 20 mEq / L 100 mL/hr at 07/22/15 0958   . aspirin EC  81 mg Oral QHS  . atorvastatin  40 mg  Oral QHS  . cholecalciferol  5,000 Units Oral Daily  . docusate sodium  200 mg Oral BID  . doxazosin  8 mg Oral QHS  . ferrous sulfate  325 mg Oral BID  . heparin subcutaneous  5,000 Units Subcutaneous 3 times per day  . hydrALAZINE  25 mg Oral BID  . levothyroxine  100 mcg Oral QAC breakfast  . metoprolol succinate  50 mg Oral Daily  . omega-3 acid ethyl esters  1 capsule Oral BID  . potassium chloride  20 mEq Oral BID  . sucralfate  1 g Oral TID WC & HS   albuterol  Assessment/ Plan:  82 y.o. male With history of coronary disease, congestive heart failure, hypertension, hyperlipidemia, hypothyroidism, anemia, chronic kidney disease  1. Acute on chronic kidney disease stage III Chronic kidney disease is likely secondary to obstructive uropathy leading to renal atrophy. Cause for acute renal failure is unclear but patient was on  Bactrim and torsemide as outpatient Those medications have been held Bladder scan was negative for urinary retention this morning, renal ultrasound is unchanged Continue conservative management Continue low-dose IV fluids  2. Hypokalemia Currently patient is getting IV replacement  3. Acute confusion versus delirium Appears improved today    LOS: 2 Delene Morais 1/13/20171:43 PM

## 2015-07-22 NOTE — Care Management (Signed)
[facility] unable to accept patient.  [facility] is able to accept patient and initiate services at time of discharge.  Family notified.  Sharmon Revere notified of referral

## 2015-07-22 NOTE — Progress Notes (Signed)
[facility] [place] NAME: [name]    MR#:  [id number]  DATE OF BIRTH:  1927-03-04  SUBJECTIVE:  CHIEF COMPLAINT:   Chief Complaint  Patient presents with  . Fatigue   patient is a 82 year old Caucasian male who presents to the hospital with complaints of generalized weakness. On arrival to the hospital patient was noted to have elevated creatinine level to 5 and low potassium level of 2.7, CT scan of abdomen and pelvis revealed chronically obstructed left kidney since 2008, otherwise unremarkable. Patient feels a little bit better today. He is receiving IV fluids and producing some urine . Kidney function as well as creatinine has improved . Patient denies any urinary retention , but his wife tells me that intermittently he has difficulty voiding . He is receiving Lupron . Ultrasound of kidneys is unchanged. Creatinine is 3.9 today, receiving IV fluids, now on doxazosin for BPH.   Bladder Scan was unremarkable , no retention was noted   Review of Systems  Unable to perform ROS: medical condition    VITAL SIGNS: Blood pressure 150/67, pulse 80, temperature 98.3 F (36.8 C), temperature source Oral, resp. rate 17, height 5\' 8"  (1.727 m), weight 82.645 kg (182 lb 3.2 oz), SpO2 94 %.  PHYSICAL EXAMINATION:   GENERAL:  82 y.o.-year-old patient lying in the bed with no acute distress.  EYES: Pupils equal, round, reactive to light and accommodation. No scleral icterus. Extraocular muscles intact.  HEENT: Head atraumatic, normocephalic. Oropharynx and nasopharynx clear.  NECK:  Supple, no jugular venous distention. No thyroid enlargement, no tenderness.  LUNGS: Normal breath sounds bilaterally, no wheezing, rales,rhonchi or crepitation. No use of accessory muscles of respiration.  CARDIOVASCULAR: S1, S2 normal. No murmurs, rubs, or gallops.  ABDOMEN: Soft, nontender, nondistended. Bowel sounds present. No organomegaly or mass.   EXTREMITIES: No pedal edema, cyanosis, or clubbing.  NEUROLOGIC: Cranial nerves II through XII are intact. Muscle strength 5/5 in all extremities. Sensation intact. Gait not checked.  PSYCHIATRIC: The patient is alert and oriented x 3.  SKIN: No obvious rash, lesion, or ulcer.   ORDERS/RESULTS REVIEWED:   CBC  Recent Labs Lab 07/20/15 1019 07/21/15 0404  WBC 4.5 3.0*  HGB 10.6* 8.9*  HCT 31.4* 26.8*  PLT 197 156  MCV 85.5 84.4  MCH 28.9 28.1  MCHC 33.8 33.3  RDW 16.3* 16.1*   ------------------------------------------------------------------------------------------------------------------  Chemistries   Recent Labs Lab 07/20/15 1019 07/21/15 0404 07/21/15 1423 07/22/15 0515  NA 135 135  135 133* 132*  K 2.7* 3.0*  3.0* 3.2* 3.1*  CL 101 108  107 106 105  CO2 22 21*  22 21* 20*  GLUCOSE 105* 84  87 99 99  BUN 60* 53*  53* 50* 45*  CREATININE 5.31* 4.68*  4.71* 4.35* 3.90*  CALCIUM 8.3* 7.7*  7.8* 7.9* 7.9*  MG  --   --  2.1  --   AST 26  --   --   --   ALT 22  --   --   --   ALKPHOS 88  --   --   --   BILITOT 0.4  --   --   --    ------------------------------------------------------------------------------------------------------------------ estimated creatinine clearance is 14.3 mL/min (by C-G formula based on Cr of 3.9). ------------------------------------------------------------------------------------------------------------------ No results for input(s): TSH, T4TOTAL, T3FREE, THYROIDAB in the last 72 hours.  Invalid input(s): FREET3  Cardiac Enzymes No results for input(s): CKMB, TROPONINI, MYOGLOBIN in  the last 168 hours.  Invalid input(s): CK ------------------------------------------------------------------------------------------------------------------ Invalid input(s): POCBNP ---------------------------------------------------------------------------------------------------------------  RADIOLOGY: US Renal  07/21/2015  CLINICAL DATA:   Acute renal failure. EXAM: RENAL / URINARY TRACT ULTRASOUND COMPLETE COMPARISON:  CT abdomen and pelvis 07/12/2015 and 06/26/2007. FINDINGS: Right Kidney: Length: 11.8 cm. Echogenicity within normal limits. No mass or hydronephrosis visualized. Left Kidney: Length: 12.8 cm. Chronic left hydronephrosis is identified as seen on the prior examinations. Bladder: Appears normal for degree of bladder distention. IMPRESSION: No acute abnormality. Chronic left hydronephrosis is unchanged in appearance compared to the prior studies. Electronically Signed   By: Inge Rise M.D.   On: 07/21/2015 14:13    EKG:  Orders placed or performed during the hospital encounter of 07/12/15  . EKG 12-Lead  . EKG 12-Lead  . ED EKG within 10 minutes  . ED EKG within 10 minutes  . EKG    ASSESSMENT AND PLAN:  Active Problems:   Acute on chronic renal failure (HCC) 1. Acute on chronic renal failure, CK D stage III, likely multifactorial due to medications, hydration issues as well as chronic bladder outlet obstruction, suspected per history. Continue IV fluids. Following kidney function closely, nephrologist input is appreciated. Creatinine is 3.9 it was 2.04 in November 2016.  2. Hypokalemia , continue intravenously. Follow Potassium level close , normal magnesium level yesterday  3. Generalized weakness, physical therapist recommends rehabilitation in skilled nursing facility, likely early next week 4. Essential hypertension, patient's blood pressure is higher today, on metoprolol , advance hydralazine, off torsemide and ARB 5. Bladder outlet obstruction, continue Cardura, improved clinically. Bladder scan was unremarkable.   Management plans discussed with the patient, family and they are in agreement.   DRUG ALLERGIES:  Allergies  Allergen Reactions  . Ativan [Lorazepam] Other (See Comments)    Hallucinations, combative  . Penicillin G Other (See Comments)    Has patient had a PCN reaction causing  immediate rash, facial/tongue/throat swelling, SOB or lightheadedness with hypotension: Yes Has patient had a PCN reaction causing severe rash involving mucus membranes or skin necrosis: No Has patient had a PCN reaction that required hospitalization No Has patient had a PCN reaction occurring within the last 10 years: No If all of the above answers are "NO", then may proceed with Cephalosporin use.     CODE STATUS:     Code Status Orders        Start     Ordered   07/20/15 1159  Full code   Continuous     07/20/15 1200    Code Status History    Date Active Date Inactive Code Status Order ID Comments User Context   06/03/2015  3:17 PM 06/07/2015 12:18 PM Full Code [id number]  Bettey Costa, MD Inpatient    Advance Directive Documentation        Most Recent Value   Type of Advance Directive  Living will   Pre-existing out of facility DNR order (yellow form or pink MOST form)     "MOST" Form in Place?        TOTAL TIME TAKING CARE OF THIS PATIENT: 40 minutes.  Discussed with patient's wife today  Mafalda Mcginniss M.D on 07/22/2015 at 2:23 PM  Between 7am to 6pm - Pager - [phone]  After 6pm go to [email or web address] - password [email or web address] [facility]  Office  [phone]  CC: Primary care physician; Lorelee Market, MD

## 2015-07-23 DIAGNOSIS — N320 Bladder-neck obstruction: Secondary | ICD-10-CM

## 2015-07-23 DIAGNOSIS — E876 Hypokalemia: Secondary | ICD-10-CM

## 2015-07-23 DIAGNOSIS — R531 Weakness: Secondary | ICD-10-CM

## 2015-07-23 DIAGNOSIS — R410 Disorientation, unspecified: Secondary | ICD-10-CM

## 2015-07-23 DIAGNOSIS — I10 Essential (primary) hypertension: Secondary | ICD-10-CM

## 2015-07-23 LAB — BASIC METABOLIC PANEL
ANION GAP: 6 (ref 5–15)
BUN: 34 mg/dL — ABNORMAL HIGH (ref 6–20)
CALCIUM: 8.1 mg/dL — AB (ref 8.9–10.3)
CO2: 19 mmol/L — AB (ref 22–32)
Chloride: 114 mmol/L — ABNORMAL HIGH (ref 101–111)
Creatinine, Ser: 3.02 mg/dL — ABNORMAL HIGH (ref 0.61–1.24)
GFR calc non Af Amer: 17 mL/min — ABNORMAL LOW (ref >60)
GFR, EST AFRICAN AMERICAN: 20 mL/min — AB (ref >60)
Glucose, Bld: 95 mg/dL (ref 65–99)
POTASSIUM: 4.1 mmol/L (ref 3.5–5.1)
Sodium: 139 mmol/L (ref 135–145)

## 2015-07-23 LAB — CBC
HEMATOCRIT: 27.7 % — AB (ref 40.0–52.0)
HEMOGLOBIN: 9.3 g/dL — AB (ref 13.0–18.0)
MCH: 28.9 pg (ref 26.0–34.0)
MCHC: 33.5 g/dL (ref 32.0–36.0)
MCV: 86.1 fL (ref 80.0–100.0)
Platelets: 163 10*3/uL (ref 150–440)
RBC: 3.22 MIL/uL — AB (ref 4.40–5.90)
RDW: 16.1 % — ABNORMAL HIGH (ref 11.5–14.5)
WBC: 3.8 10*3/uL (ref 3.8–10.6)

## 2015-07-23 MED ORDER — HYDRALAZINE HCL 25 MG PO TABS: 25.0000 mg | ORAL_TABLET | Freq: Three times a day (TID) | ORAL | Status: DC

## 2015-07-23 MED ORDER — ISOSORBIDE MONONITRATE ER 30 MG PO TB24: 30.0000 mg | ORAL_TABLET | Freq: Every day | ORAL | Status: DC

## 2015-07-23 MED ORDER — SODIUM CHLORIDE 0.9 % IV SOLN
INTRAVENOUS | Status: DC
  Administered 2015-07-23: 10:00:00 via INTRAVENOUS

## 2015-07-23 NOTE — Care Management Note (Signed)
Case Management Note  Patient Details  Name: [name] MRN: [id number] Date of Birth: 09-03-1926  Subjective/Objective:         Call to Malachy Mood at [facility] who stated that [facility] can accept [name] and she accepted a referral for home health PT, RN, Aid. Discharge information faxed to [facility] at Fax: [phone].            Action/Plan:   Expected Discharge Date:                  Expected Discharge Plan:     In-House Referral:     Discharge planning Services     Post Acute Care Choice:    Choice offered to:     DME Arranged:    DME Agency:     HH Arranged:    [place] Agency:     Status of Service:     Medicare Important Message Given:  Yes Date Medicare IM Given:    Medicare IM give by:    Date Additional Medicare IM Given:    Additional Medicare Important Message give by:     If discussed at [place] of Stay Meetings, dates discussed:    Additional Comments:  Delva Derden A, RN 07/23/2015, 11:05 AM

## 2015-07-23 NOTE — Plan of Care (Signed)
Problem: Safety: Goal: Ability to remain free from injury will improve Outcome: Not Progressing Pt continues to jump oob unassisted

## 2015-07-23 NOTE — Progress Notes (Signed)
Subjective:  Patient is known to our practice from outpatient and is followed by Dr. Juleen China for chronic kidney disease stage III. He has chronically obstructed left kidney   Overall doing better today Serum creatinine slightly improved to 3.02 Urine output is also good   Objective:  Vital signs in last 24 hours:  Temp:  [98.2 F (36.8 C)-98.3 F (36.8 C)] 98.2 F (36.8 C) (01/13 2138) Pulse Rate:  [63-80] 63 (01/14 0510) Resp:  [17-20] 20 (01/13 2138) BP: (141-156)/(66-83) 142/72 mmHg (01/14 1022) SpO2:  [94 %-98 %] 98 % (01/13 2138)  Weight change:  Filed Weights   07/20/15 1004 07/20/15 1422  Weight: 81.647 kg (180 lb) 82.645 kg (182 lb 3.2 oz)    Intake/Output:    Intake/Output Summary (Last 24 hours) at 07/23/15 1138 Last data filed at 07/23/15 0908  Gross per 24 hour  Intake   2589 ml  Output   1225 ml  Net   1364 ml     Physical Exam: General:  no acute distress   HEENT  anicteric sclera, moist oral mucous membranes   Neck  supple   Pulm/lungs  scattered rhonchi, normal respiratory effort   CVS/Heart  no rub or gallop   Abdomen:   soft, nontender, somewhat distended   Extremities:  no peripheral edema   Neurologic:  alert, able to follow commands,   Skin:  no acute rashes   Access:        Basic Metabolic Panel:   Recent Labs Lab 07/20/15 1019 07/21/15 0404 07/21/15 1423 07/22/15 0515 07/23/15 0605  NA 135 135  135 133* 132* 139  K 2.7* 3.0*  3.0* 3.2* 3.1* 4.1  CL 101 108  107 106 105 114*  CO2 22 21*  22 21* 20* 19*  GLUCOSE 105* 84  87 99 99 95  BUN 60* 53*  53* 50* 45* 34*  CREATININE 5.31* 4.68*  4.71* 4.35* 3.90* 3.02*  CALCIUM 8.3* 7.7*  7.8* 7.9* 7.9* 8.1*  MG  --   --  2.1  --   --   PHOS  --  3.5  --   --   --      CBC:  Recent Labs Lab 07/20/15 1019 07/21/15 0404 07/23/15 0605  WBC 4.5 3.0* 3.8  HGB 10.6* 8.9* 9.3*  HCT 31.4* 26.8* 27.7*  MCV 85.5 84.4 86.1  PLT 197 156 163      Microbiology:  No  results found for this or any previous visit (from the past 720 hour(s)).  Coagulation Studies: No results for input(s): LABPROT, INR in the last 72 hours.  Urinalysis:  Recent Labs  07/21/15 1505  COLORURINE STRAW*  LABSPEC 1.009  PHURINE 6.0  GLUCOSEU NEGATIVE  HGBUR 1+*  BILIRUBINUR NEGATIVE  KETONESUR NEGATIVE  PROTEINUR NEGATIVE  NITRITE NEGATIVE  LEUKOCYTESUR NEGATIVE      Imaging: US Renal  07/21/2015  CLINICAL DATA:  Acute renal failure. EXAM: RENAL / URINARY TRACT ULTRASOUND COMPLETE COMPARISON:  CT abdomen and pelvis 07/12/2015 and 06/26/2007. FINDINGS: Right Kidney: Length: 11.8 cm. Echogenicity within normal limits. No mass or hydronephrosis visualized. Left Kidney: Length: 12.8 cm. Chronic left hydronephrosis is identified as seen on the prior examinations. Bladder: Appears normal for degree of bladder distention. IMPRESSION: No acute abnormality. Chronic left hydronephrosis is unchanged in appearance compared to the prior studies. Electronically Signed   By: Inge Rise M.D.   On: 07/21/2015 14:13     Medications:   . sodium chloride 100 mL/hr  at 07/23/15 1021   . aspirin EC  81 mg Oral QHS  . atorvastatin  40 mg Oral QHS  . cholecalciferol  5,000 Units Oral Daily  . docusate sodium  200 mg Oral BID  . doxazosin  8 mg Oral QHS  . ferrous sulfate  325 mg Oral BID  . heparin subcutaneous  5,000 Units Subcutaneous 3 times per day  . hydrALAZINE  25 mg Oral 3 times per day  . isosorbide mononitrate  30 mg Oral Daily  . levothyroxine  100 mcg Oral QAC breakfast  . metoprolol succinate  50 mg Oral Daily  . omega-3 acid ethyl esters  1 capsule Oral BID  . potassium chloride  20 mEq Oral BID  . sucralfate  1 g Oral TID WC & HS   albuterol  Assessment/ Plan:  82 y.o. male With history of coronary disease, congestive heart failure, hypertension, hyperlipidemia, hypothyroidism, anemia, chronic kidney disease  1. Acute on chronic kidney disease stage  III Chronic kidney disease is likely secondary to obstructive uropathy leading to renal atrophy. Cause for acute renal failure is unclear but patient was on Bactrim and torsemide as outpatient Those medications have been held Bladder scan was negative for urinary retention this morning, renal ultrasound is unchanged Continue conservative management  f/u outpatient   2. Hypokalemia Improved with replacement  3. Acute confusion versus delirium Appears improved and back to baseline    LOS: 3 Junie Engram 1/14/201711:38 AM

## 2015-07-23 NOTE — Discharge Summary (Signed)
[facility] [place] NAME: [name]    MR#:  [id number]  DATE OF BIRTH:  Jun 25, 1927  DATE OF ADMISSION:  07/20/2015 ADMITTING PHYSICIAN: Epifanio Lesches, MD  DATE OF DISCHARGE: 07/23/2015 12:48 PM  PRIMARY CARE PHYSICIAN: Lorelee Market, MD     ADMISSION DIAGNOSIS:  Acute on chronic renal failure ([place]) [N17.9, N18.9]  DISCHARGE DIAGNOSIS:  Principal Problem:   Acute on chronic renal failure (HCC) Active Problems:   Bladder outlet obstruction   Hypokalemia   Generalized weakness   Acute delirium   Benign essential HTN   SECONDARY DIAGNOSIS:   Past Medical History  Diagnosis Date  . Prostate cancer ([place])   . Skin cancer     right cheek  . Hypercholesteremia   . Cardiac disease   . Shingles   . Kidney failure     left  . Hypertension   . Osteoporosis   . CHF (congestive heart failure) ([place])     .pro HOSPITAL COURSE:  The patient is a 82 year old Caucasian male who presents to the hospital with complaints of generalized weakness. On arrival to the hospital patient was noted to have elevated creatinine level to 5 and low potassium level of 2.7, CT scan of abdomen and pelvis revealed chronically obstructed left kidney since 2008, otherwise unremarkable. Patient was initiated on  IV fluids and his kidney function, urinary output as well as creatinine has improved . Patient denied any urinary retention , but his wife told physicians that intermittently he has difficulty voiding . He is receiving Lupron, flomax and doxazosin at home, bladder scan was unremarkable . Ultrasound of kidneys was unchanged. Creatinine improved 3.02 by 07/23/15, it was felt patient is stable to be discharged home with [facility], he refused rehabilitation placement. Discussion by problem: 1. Acute on chronic renal failure, CK D stage III, likely multifactorial due to medications, dehydration as well as chronic bladder outlet obstruction, suspected  per history. Improved on IV fluids, patient was recommended to drink plenty of fluids He is to follow up with Dr.  Juleen China on Monday to recheck Cr. Nephrologist input was appreciated. Creatinine is 3.02 by 07/23/15,   it was 2.04 in November 2016.  2. Hypokalemia , supplemented intravenously, resolved. Normal magnesium level  3. Generalized weakness, physical therapist recommends rehabilitation in skilled nursing facility, refused, order [facility] services upon dc home today 4. Essential hypertension, patient's blood pressure is some higher , continue metoprolol , advanced hydralazine dose, Imdur, off torsemide and ARB 5. Bladder outlet obstruction, continue alpha blockers, stable clinically. Bladder scan was unremarkable, US  showed no new dchanges  DISCHARGE CONDITIONS:   stABLE  CONSULTS OBTAINED:  Treatment Team:  Murlean Iba, MD  DRUG ALLERGIES:   Allergies  Allergen Reactions  . Ativan [Lorazepam] Other (See Comments)    Hallucinations, combative  . Penicillin G Other (See Comments)    Has patient had a PCN reaction causing immediate rash, facial/tongue/throat swelling, SOB or lightheadedness with hypotension: Yes Has patient had a PCN reaction causing severe rash involving mucus membranes or skin necrosis: No Has patient had a PCN reaction that required hospitalization No Has patient had a PCN reaction occurring within the last 10 years: No If all of the above answers are "NO", then may proceed with Cephalosporin use.     DISCHARGE MEDICATIONS:   Discharge Medication List as of 07/23/2015 12:23 PM    START taking these medications   Details  isosorbide mononitrate (IMDUR) 30 MG  24 hr tablet Take 1 tablet (30 mg total) by mouth daily., Starting 07/23/2015, Until Discontinued, Normal      CONTINUE these medications which have CHANGED   Details  hydrALAZINE (APRESOLINE) 25 MG tablet Take 1 tablet (25 mg total) by mouth every 8 (eight) hours., Starting 07/23/2015, Until  Discontinued, Normal      CONTINUE these medications which have NOT CHANGED   Details  albuterol (PROVENTIL HFA;VENTOLIN HFA) 108 (90 BASE) MCG/ACT inhaler Inhale 2 puffs into the lungs every 6 (six) hours as needed for wheezing or shortness of breath., Until Discontinued, Historical Med    aspirin EC 81 MG tablet Take 81 mg by mouth at bedtime., Until Discontinued, Historical Med    atorvastatin (LIPITOR) 40 MG tablet Take 40 mg by mouth at bedtime. , Until Discontinued, Historical Med    Cholecalciferol (VITAMIN D3) 5000 units TABS Take 5,000 Units by mouth daily., Until Discontinued, Historical Med    docusate sodium (COLACE) 100 MG capsule Take 2 capsules (200 mg total) by mouth 2 (two) times daily., Starting 07/12/2015, Until Discontinued, Print    doxazosin (CARDURA) 8 MG tablet Take 8 mg by mouth at bedtime. , Until Discontinued, Historical Med    ferrous sulfate 325 (65 FE) MG tablet Take 325 mg by mouth 2 (two) times daily. , Until Discontinued, Historical Med    levothyroxine (SYNTHROID, LEVOTHROID) 100 MCG tablet Take 100 mcg by mouth daily., Until Discontinued, Historical Med    metoprolol succinate (TOPROL-XL) 50 MG 24 hr tablet Take 50 mg by mouth daily. , Until Discontinued, Historical Med    nitroGLYCERIN (NITROSTAT) 0.4 MG SL tablet Place 0.4 mg under the tongue every 5 (five) minutes as needed for chest pain., Until Discontinued, Historical Med    omega-3 acid ethyl esters (LOVAZA) 1 G capsule Take 1 capsule by mouth 2 (two) times daily., Until Discontinued, Historical Med    pantoprazole (PROTONIX) 40 MG tablet Take 40 mg by mouth 2 (two) times daily. , Until Discontinued, Historical Med    polyethylene glycol powder (GLYCOLAX/MIRALAX) powder 2 cap fulls in a full glass of water, three times a day, for 5 days., Print    senna (SENOKOT) 8.6 MG TABS tablet Take 2 tablets (17.2 mg total) by mouth 2 (two) times daily., Starting 07/12/2015, Until Discontinued, Print     sucralfate (CARAFATE) 1 g tablet Take 1 g by mouth 4 (four) times daily -  with meals and at bedtime., Until Discontinued, Historical Med    tamsulosin (FLOMAX) 0.4 MG CAPS capsule Take 0.4 mg by mouth daily., Until Discontinued, Historical Med      STOP taking these medications     losartan (COZAAR) 100 MG tablet      potassium chloride (K-DUR) 10 MEQ tablet      sulfamethoxazole-trimethoprim (BACTRIM DS,SEPTRA DS) 800-160 MG tablet      torsemide (DEMADEX) 20 MG tablet          DISCHARGE INSTRUCTIONS:    PATIENT IS TO FOLLOW UP WITH PCP, NEPHROLOGY  If you experience worsening of your admission symptoms, develop shortness of breath, life threatening emergency, suicidal or homicidal thoughts you must seek medical attention immediately by calling 911 or calling your MD immediately  if symptoms less severe.  You Must read complete instructions/literature along with all the possible adverse reactions/side effects for all the Medicines you take and that have been prescribed to you. Take any new Medicines after you have completely understood and accept all the possible adverse reactions/side effects.  Please note  You were cared for by a hospitalist during your hospital stay. If you have any questions about your discharge medications or the care you received while you were in the hospital after you are discharged, you can call the unit and asked to speak with the hospitalist on call if the hospitalist that took care of you is not available. Once you are discharged, your primary care physician will handle any further medical issues. Please note that NO REFILLS for any discharge medications will be authorized once you are discharged, as it is imperative that you return to your primary care physician (or establish a relationship with a primary care physician if you do not have one) for your aftercare needs so that they can reassess your need for medications and monitor your lab  values.    Today   CHIEF COMPLAINT:   Chief Complaint  Patient presents with  . Fatigue    HISTORY OF PRESENT ILLNESS:  [name]  is a 82 y.o. male with a known history of CKD stage 3, who presents to the hospital with complaints of generalized weakness. On arrival to the hospital patient was noted to have elevated creatinine level to 5 and low potassium level of 2.7, CT scan of abdomen and pelvis revealed chronically obstructed left kidney since 2008, otherwise unremarkable. Patient was initiated on  IV fluids and his kidney function, urinary output as well as creatinine has improved . Patient denied any urinary retention , but his wife told physicians that intermittently he has difficulty voiding . He is receiving Lupron, flomax and doxazosin at home, bladder scan was unremarkable . Ultrasound of kidneys was unchanged. Creatinine improved 3.02 by 07/23/15, it was felt patient is stable to be discharged home with [facility], he refused rehabilitation placement. Discussion by problem: 1. Acute on chronic renal failure, CK D stage III, likely multifactorial due to medications, dehydration as well as chronic bladder outlet obstruction, suspected per history. Improved on IV fluids, patient was recommended to drink plenty of fluids He is to follow up with Dr.  Juleen China on Monday to recheck Cr. Nephrologist input was appreciated. Creatinine is 3.02 by 07/23/15,   it was 2.04 in November 2016.  2. Hypokalemia , supplemented intravenously, resolved. Normal magnesium level  3. Generalized weakness, physical therapist recommends rehabilitation in skilled nursing facility, refused, order [facility] services upon dc home today 4. Essential hypertension, patient's blood pressure is some higher , continue metoprolol , advanced hydralazine dose, Imdur, off torsemide and ARB 5. Bladder outlet obstruction, continue alpha blockers, stable clinically. Bladder scan was unremarkable, US  showed no new dchanges    VITAL SIGNS:   Blood pressure 142/72, pulse 63, temperature 98.2 F (36.8 C), temperature source Oral, resp. rate 20, height 5\' 8"  (1.727 m), weight 82.645 kg (182 lb 3.2 oz), SpO2 98 %.  I/O:   Intake/Output Summary (Last 24 hours) at 07/23/15 1346 Last data filed at 07/23/15 0908  Gross per 24 hour  Intake   2589 ml  Output   1025 ml  Net   1564 ml    PHYSICAL EXAMINATION:  GENERAL:  82 y.o.-year-old patient lying in the bed with no acute distress.  EYES: Pupils equal, round, reactive to light and accommodation. No scleral icterus. Extraocular muscles intact.  HEENT: Head atraumatic, normocephalic. Oropharynx and nasopharynx clear.  NECK:  Supple, no jugular venous distention. No thyroid enlargement, no tenderness.  LUNGS: Normal breath sounds bilaterally, no wheezing, rales,rhonchi or crepitation. No use of accessory muscles  of respiration.  CARDIOVASCULAR: S1, S2 normal. No murmurs, rubs, or gallops.  ABDOMEN: Soft, non-tender, non-distended. Bowel sounds present. No organomegaly or mass.  EXTREMITIES: No pedal edema, cyanosis, or clubbing.  NEUROLOGIC: Cranial nerves II through XII are intact. Muscle strength 5/5 in all extremities. Sensation intact. Gait not checked.  PSYCHIATRIC: The patient is alert and oriented x 3.  SKIN: No obvious rash, lesion, or ulcer.   DATA REVIEW:   CBC  Recent Labs Lab 07/23/15 0605  WBC 3.8  HGB 9.3*  HCT 27.7*  PLT 163    Chemistries   Recent Labs Lab 07/20/15 1019  07/21/15 1423  07/23/15 0605  NA 135  < > 133*  < > 139  K 2.7*  < > 3.2*  < > 4.1  CL 101  < > 106  < > 114*  CO2 22  < > 21*  < > 19*  GLUCOSE 105*  < > 99  < > 95  BUN 60*  < > 50*  < > 34*  CREATININE 5.31*  < > 4.35*  < > 3.02*  CALCIUM 8.3*  < > 7.9*  < > 8.1*  MG  --   --  2.1  --   --   AST 26  --   --   --   --   ALT 22  --   --   --   --   ALKPHOS 88  --   --   --   --   BILITOT 0.4  --   --   --   --   < > = values in this interval not displayed.  Cardiac  Enzymes No results for input(s): TROPONINI in the last 168 hours.  Microbiology Results  Results for orders placed or performed in visit on 05/28/12  Urine culture     Status: None   Collection Time: 05/28/12 12:44 PM  Result Value Ref Range Status   Micro Text Report   Final       SOURCE: CLEAN CATCH    ORGANISM 1                3000 CFU/ML GRAM POSITIVE COCCI   COMMENT                   RESULTS SUGGESTIVE OF CONTAMINATION   ANTIBIOTIC                                                        RADIOLOGY:  US Renal  07/21/2015  CLINICAL DATA:  Acute renal failure. EXAM: RENAL / URINARY TRACT ULTRASOUND COMPLETE COMPARISON:  CT abdomen and pelvis 07/12/2015 and 06/26/2007. FINDINGS: Right Kidney: Length: 11.8 cm. Echogenicity within normal limits. No mass or hydronephrosis visualized. Left Kidney: Length: 12.8 cm. Chronic left hydronephrosis is identified as seen on the prior examinations. Bladder: Appears normal for degree of bladder distention. IMPRESSION: No acute abnormality. Chronic left hydronephrosis is unchanged in appearance compared to the prior studies. Electronically Signed   By: Inge Rise M.D.   On: 07/21/2015 14:13    EKG:   Orders placed or performed during the hospital encounter of 07/12/15  . EKG 12-Lead  . EKG 12-Lead  . ED EKG within 10 minutes  . ED EKG within 10 minutes  . EKG  Management plans discussed with the patient, family and they are in agreement.  CODE STATUS:     Code Status Orders        Start     Ordered   07/20/15 1159  Full code   Continuous     07/20/15 1200    Code Status History    Date Active Date Inactive Code Status Order ID Comments User Context   06/03/2015  3:17 PM 06/07/2015 12:18 PM Full Code [id number]  Bettey Costa, MD Inpatient    Advance Directive Documentation        Most Recent Value   Type of Advance Directive  Living will   Pre-existing out of facility DNR order (yellow form or pink MOST form)      "MOST" Form in Place?        TOTAL TIME TAKING CARE OF THIS PATIENT: 40  minutes.    Theodoro Grist M.D on 07/23/2015 at 1:46 PM  Between 7am to 6pm - Pager - [phone]  After 6pm go to [email or web address] - password [email or web address] [facility]  Office  [phone]  CC: Primary care physician; Lorelee Market, MD

## 2015-11-16 ENCOUNTER — Other Ambulatory Visit: Payer: Self-pay | Admitting: *Deleted

## 2015-11-16 DIAGNOSIS — C61 Malignant neoplasm of prostate: Secondary | ICD-10-CM

## 2015-11-22 ENCOUNTER — Other Ambulatory Visit: Payer: Self-pay | Admitting: *Deleted

## 2015-11-23 ENCOUNTER — Other Ambulatory Visit: Payer: Self-pay | Admitting: *Deleted

## 2015-11-24 ENCOUNTER — Inpatient Hospital Stay: Payer: Medicare Other | Attending: Radiation Oncology

## 2015-11-24 ENCOUNTER — Ambulatory Visit
Admission: RE | Admit: 2015-11-24 | Discharge: 2015-11-24 | Disposition: A | Discharge status: Home / Self Care | Payer: Medicare Other | Source: Ambulatory Visit | Attending: Radiation Oncology | Admitting: Radiation Oncology

## 2015-11-24 ENCOUNTER — Encounter: Payer: Self-pay | Admitting: Radiation Oncology

## 2015-11-24 VITALS — BP 150/72 | HR 60 | Temp 96.5°F | Resp 18 | Ht 67.0 in | Wt 170.1 lb

## 2015-11-24 DIAGNOSIS — C61 Malignant neoplasm of prostate: Secondary | ICD-10-CM | POA: Insufficient documentation

## 2015-11-24 DIAGNOSIS — Z79818 Long term (current) use of other agents affecting estrogen receptors and estrogen levels: Secondary | ICD-10-CM | POA: Insufficient documentation

## 2015-11-24 LAB — PSA: PSA: 5.64 ng/mL — ABNORMAL HIGH (ref 0.00–4.00)

## 2015-11-24 NOTE — Progress Notes (Signed)
Radiation Oncology Follow up Note  Name: [name]   Date:   11/24/2015 MRN:  [id number] DOB: 10-22-1926    This 82 y.o. male presents to the clinic today for follow-up for prostate cancer with slowly rising PSA.  REFERRING PROVIDER: No ref. provider found  HPI: Patient is an 82 year old male treated back in 2006 for Gleason 7 adenocarcinoma the prostate has been on pulse Lupron therapy when he developed PSA bump in November 2015. His PSA back in 1 year prior was 3.65. He is otherwise doing well specifically denies bone pain any increased lower urinary tract symptoms or diarrhea..  COMPLICATIONS OF TREATMENT: none  FOLLOW UP COMPLIANCE: keeps appointments   PHYSICAL EXAM:  BP 150/72 mmHg  Pulse 60  Temp(Src) 96.5 F (35.8 C)  Resp 18  Ht 5\' 7"  (1.702 m)  Wt 170 lb 1.4 oz (77.15 kg)  BMI 26.63 kg/m2 Well-developed well-nourished patient in NAD. HEENT reveals PERLA, EOMI, discs not visualized.  Oral cavity is clear. No oral mucosal lesions are identified. Neck is clear without evidence of cervical or supraclavicular adenopathy. Lungs are clear to A&P. Cardiac examination is essentially unremarkable with regular rate and rhythm without murmur rub or thrill. Abdomen is benign with no organomegaly or masses noted. Motor sensory and DTR levels are equal and symmetric in the upper and lower extremities. Cranial nerves II through XII are grossly intact. Proprioception is intact. No peripheral adenopathy or edema is identified. No motor or sensory levels are noted. Crude visual fields are within normal range.  RADIOLOGY RESULTS: No current films for review  PLAN: Present time patient continues to do well. I've run a PSA level on him today and depending on that figure may offer another pulse of Lupron. Otherwise of asked to see him back again in 1 year for follow-up. Patient is to call with any concerns.  I would like to take this opportunity for allowing me to participate in the care of  your patient.Armstead Peaks., MD

## 2016-05-21 ENCOUNTER — Other Ambulatory Visit: Payer: Self-pay | Admitting: *Deleted

## 2016-05-21 DIAGNOSIS — C61 Malignant neoplasm of prostate: Secondary | ICD-10-CM

## 2016-05-23 ENCOUNTER — Encounter: Payer: Self-pay | Admitting: Radiation Oncology

## 2016-05-23 ENCOUNTER — Ambulatory Visit
Admission: RE | Admit: 2016-05-23 | Discharge: 2016-05-23 | Disposition: A | Discharge status: Home / Self Care | Payer: Medicare Other | Source: Ambulatory Visit | Attending: Radiation Oncology | Admitting: Radiation Oncology

## 2016-05-23 ENCOUNTER — Other Ambulatory Visit: Payer: Self-pay | Admitting: *Deleted

## 2016-05-23 ENCOUNTER — Inpatient Hospital Stay: Payer: Medicare Other | Attending: Radiation Oncology

## 2016-05-23 VITALS — BP 122/62 | HR 62 | Temp 96.7°F | Resp 20 | Wt 162.6 lb

## 2016-05-23 DIAGNOSIS — K5900 Constipation, unspecified: Secondary | ICD-10-CM | POA: Insufficient documentation

## 2016-05-23 DIAGNOSIS — C61 Malignant neoplasm of prostate: Secondary | ICD-10-CM

## 2016-05-23 DIAGNOSIS — R9721 Rising PSA following treatment for malignant neoplasm of prostate: Secondary | ICD-10-CM | POA: Insufficient documentation

## 2016-05-23 DIAGNOSIS — Z79818 Long term (current) use of other agents affecting estrogen receptors and estrogen levels: Secondary | ICD-10-CM | POA: Insufficient documentation

## 2016-05-23 DIAGNOSIS — Z923 Personal history of irradiation: Secondary | ICD-10-CM | POA: Diagnosis not present

## 2016-05-23 LAB — PSA: PSA: 7.77 ng/mL — AB (ref 0.00–4.00)

## 2016-05-23 NOTE — Progress Notes (Signed)
Radiation Oncology Follow up Note  Name: [name]   Date:   05/23/2016 MRN:  [id number] DOB: 11-Apr-1927    This 82 y.o. male presents to the clinic today for follow-up for prostate cancer treated back in 2006 with slowly rising PSA.  REFERRING PROVIDER: Lorelee Market, MD  HPI: Patient is an 82 year old male treated back in 2006 for Gleason 7 adenocarcinoma the proximal prostate. He has been on Lupron pulse therapy since 2015. 6 months ago his PSA was 5.6 which he declined over 6 months from 3.6. We have repeated his PSA today. He specifically denies any increasing lower urinary tract symptoms or bone pain. He does have chronic constipation does take a laxative..  COMPLICATIONS OF TREATMENT: none  FOLLOW UP COMPLIANCE: keeps appointments   PHYSICAL EXAM:  BP 122/62   Pulse 62   Temp (!) 96.7 F (35.9 C)   Resp 20   Wt 162 lb 9.4 oz (73.8 kg)   BMI 25.47 kg/m  Well-developed well-nourished patient in NAD. HEENT reveals PERLA, EOMI, discs not visualized.  Oral cavity is clear. No oral mucosal lesions are identified. Neck is clear without evidence of cervical or supraclavicular adenopathy. Lungs are clear to A&P. Cardiac examination is essentially unremarkable with regular rate and rhythm without murmur rub or thrill. Abdomen is benign with no organomegaly or masses noted. Motor sensory and DTR levels are equal and symmetric in the upper and lower extremities. Cranial nerves II through XII are grossly intact. Proprioception is intact. No peripheral adenopathy or edema is identified. No motor or sensory levels are noted. Crude visual fields are within normal range.  RADIOLOGY RESULTS: No current films for review  PLAN: I repeated his PSA level today and will make a decision regarding pulse Lupron therapy based on that result. Otherwise I've asked to see him back in 6 months and will continue to follow the patient clinically for slightly rising PSA. Patient comprehends my treatment  plan well.  I would like to take this opportunity to thank you for allowing me to participate in the care of your patient.Armstead Peaks., MD

## 2016-05-24 ENCOUNTER — Other Ambulatory Visit: Payer: Self-pay | Admitting: *Deleted

## 2016-05-24 ENCOUNTER — Telehealth: Payer: Self-pay | Admitting: *Deleted

## 2016-05-24 DIAGNOSIS — C61 Malignant neoplasm of prostate: Secondary | ICD-10-CM

## 2016-05-24 NOTE — Telephone Encounter (Signed)
Lupron injection scheduled for 06/05/16 at 10:15.  Wife notified with read back of appointment time.

## 2016-06-05 ENCOUNTER — Inpatient Hospital Stay: Payer: Medicare Other

## 2016-06-05 DIAGNOSIS — C61 Malignant neoplasm of prostate: Secondary | ICD-10-CM | POA: Diagnosis not present

## 2016-06-05 MED ORDER — LEUPROLIDE ACETATE (4 MONTH) 30 MG IM KIT
30.0000 mg | PACK | Freq: Once | INTRAMUSCULAR | Status: AC
  Administered 2016-06-05: 30 mg via INTRAMUSCULAR
  Filled 2016-06-05: qty 30

## 2016-06-24 ENCOUNTER — Emergency Department: Payer: Medicare Other

## 2016-06-24 ENCOUNTER — Inpatient Hospital Stay
Admission: EM | Admit: 2016-06-24 | Discharge: 2016-06-30 | DRG: 871 | Disposition: A | Discharge status: Home / Self Care | Payer: Medicare Other | Attending: Internal Medicine | Admitting: Internal Medicine

## 2016-06-24 DIAGNOSIS — N184 Chronic kidney disease, stage 4 (severe): Secondary | ICD-10-CM | POA: Diagnosis present

## 2016-06-24 DIAGNOSIS — Z880 Allergy status to penicillin: Secondary | ICD-10-CM

## 2016-06-24 DIAGNOSIS — K819 Cholecystitis, unspecified: Secondary | ICD-10-CM

## 2016-06-24 DIAGNOSIS — I480 Paroxysmal atrial fibrillation: Secondary | ICD-10-CM | POA: Diagnosis present

## 2016-06-24 DIAGNOSIS — K567 Ileus, unspecified: Secondary | ICD-10-CM | POA: Diagnosis not present

## 2016-06-24 DIAGNOSIS — I482 Chronic atrial fibrillation: Secondary | ICD-10-CM | POA: Diagnosis present

## 2016-06-24 DIAGNOSIS — K449 Diaphragmatic hernia without obstruction or gangrene: Secondary | ICD-10-CM | POA: Diagnosis present

## 2016-06-24 DIAGNOSIS — I248 Other forms of acute ischemic heart disease: Secondary | ICD-10-CM | POA: Diagnosis present

## 2016-06-24 DIAGNOSIS — R109 Unspecified abdominal pain: Secondary | ICD-10-CM

## 2016-06-24 DIAGNOSIS — R339 Retention of urine, unspecified: Secondary | ICD-10-CM | POA: Diagnosis present

## 2016-06-24 DIAGNOSIS — R0602 Shortness of breath: Secondary | ICD-10-CM

## 2016-06-24 DIAGNOSIS — I959 Hypotension, unspecified: Secondary | ICD-10-CM | POA: Diagnosis present

## 2016-06-24 DIAGNOSIS — E872 Acidosis, unspecified: Secondary | ICD-10-CM

## 2016-06-24 DIAGNOSIS — M549 Dorsalgia, unspecified: Secondary | ICD-10-CM | POA: Diagnosis present

## 2016-06-24 DIAGNOSIS — Z79899 Other long term (current) drug therapy: Secondary | ICD-10-CM

## 2016-06-24 DIAGNOSIS — I4510 Unspecified right bundle-branch block: Secondary | ICD-10-CM | POA: Diagnosis present

## 2016-06-24 DIAGNOSIS — R262 Difficulty in walking, not elsewhere classified: Secondary | ICD-10-CM

## 2016-06-24 DIAGNOSIS — C61 Malignant neoplasm of prostate: Secondary | ICD-10-CM | POA: Diagnosis present

## 2016-06-24 DIAGNOSIS — Z888 Allergy status to other drugs, medicaments and biological substances status: Secondary | ICD-10-CM

## 2016-06-24 DIAGNOSIS — A419 Sepsis, unspecified organism: Secondary | ICD-10-CM | POA: Diagnosis not present

## 2016-06-24 DIAGNOSIS — F17220 Nicotine dependence, chewing tobacco, uncomplicated: Secondary | ICD-10-CM | POA: Diagnosis present

## 2016-06-24 DIAGNOSIS — R2681 Unsteadiness on feet: Secondary | ICD-10-CM

## 2016-06-24 DIAGNOSIS — G9340 Encephalopathy, unspecified: Secondary | ICD-10-CM | POA: Diagnosis not present

## 2016-06-24 DIAGNOSIS — R319 Hematuria, unspecified: Secondary | ICD-10-CM | POA: Diagnosis present

## 2016-06-24 DIAGNOSIS — Z85828 Personal history of other malignant neoplasm of skin: Secondary | ICD-10-CM

## 2016-06-24 DIAGNOSIS — Z7982 Long term (current) use of aspirin: Secondary | ICD-10-CM

## 2016-06-24 DIAGNOSIS — K810 Acute cholecystitis: Secondary | ICD-10-CM

## 2016-06-24 DIAGNOSIS — E039 Hypothyroidism, unspecified: Secondary | ICD-10-CM | POA: Diagnosis present

## 2016-06-24 DIAGNOSIS — K8000 Calculus of gallbladder with acute cholecystitis without obstruction: Secondary | ICD-10-CM | POA: Diagnosis present

## 2016-06-24 DIAGNOSIS — M810 Age-related osteoporosis without current pathological fracture: Secondary | ICD-10-CM | POA: Diagnosis present

## 2016-06-24 DIAGNOSIS — R0682 Tachypnea, not elsewhere classified: Secondary | ICD-10-CM

## 2016-06-24 DIAGNOSIS — I130 Hypertensive heart and chronic kidney disease with heart failure and stage 1 through stage 4 chronic kidney disease, or unspecified chronic kidney disease: Secondary | ICD-10-CM | POA: Diagnosis present

## 2016-06-24 DIAGNOSIS — M6281 Muscle weakness (generalized): Secondary | ICD-10-CM

## 2016-06-24 DIAGNOSIS — E7800 Pure hypercholesterolemia, unspecified: Secondary | ICD-10-CM | POA: Diagnosis present

## 2016-06-24 DIAGNOSIS — Z8249 Family history of ischemic heart disease and other diseases of the circulatory system: Secondary | ICD-10-CM

## 2016-06-24 DIAGNOSIS — D696 Thrombocytopenia, unspecified: Secondary | ICD-10-CM | POA: Diagnosis not present

## 2016-06-24 DIAGNOSIS — I5022 Chronic systolic (congestive) heart failure: Secondary | ICD-10-CM | POA: Diagnosis present

## 2016-06-24 MED ORDER — IPRATROPIUM-ALBUTEROL 0.5-2.5 (3) MG/3ML IN SOLN
3.0000 mL | Freq: Once | RESPIRATORY_TRACT | Status: AC
  Administered 2016-06-25: 3 mL via RESPIRATORY_TRACT
  Filled 2016-06-24: qty 3

## 2016-06-24 MED ORDER — SODIUM CHLORIDE 0.9 % IV BOLUS (SEPSIS)
250.0000 mL | Freq: Once | INTRAVENOUS | Status: AC
Start: 2016-06-24 — End: 2016-06-25
  Administered 2016-06-25: 250 mL via INTRAVENOUS

## 2016-06-24 NOTE — ED Provider Notes (Signed)
[facility] Emergency Department Provider Note    First MD Initiated Contact with Patient 06/24/16 2237     (approximate)  I have reviewed the triage vital signs and the nursing notes.   HISTORY  Chief Complaint Chest Pain; Abdominal Pain; and Back Pain    HPI [name] is a 82 y.o. male with a history of CHF and hypertension as well as prostate cancer presents with initially chest pain that progressed to mid abdominal pain radiating through to his back associated with shortness of breath and weakness. Patient also with a history of GI bleeding. Not currently on any anticoagulation. He does take a daily aspirin. Denies any dysuria or fevers.  Chart review shows no h/o AAA in January of this year.  He has had GI bleeds in the past.     Past Medical History:  Diagnosis Date  . Cardiac disease   . CHF (congestive heart failure) ([place])   . Hypercholesteremia   . Hypertension   . Kidney failure    left  . Osteoporosis   . Prostate cancer ([place])   . Shingles   . Skin cancer    right cheek   No family history on file. Past Surgical History:  Procedure Laterality Date  . CARDIAC CATHETERIZATION  04/13/2014   Patient Active Problem List   Diagnosis Date Noted  . Hypokalemia 07/23/2015  . Generalized weakness 07/23/2015  . Bladder outlet obstruction 07/23/2015  . Acute delirium 07/23/2015  . Benign essential HTN 07/23/2015  . Acute on chronic renal failure ([place]) 07/20/2015  . GIB (gastrointestinal bleeding) 06/03/2015      Prior to Admission medications   Medication Sig Start Date End Date Taking? Authorizing Provider  albuterol (PROVENTIL HFA;VENTOLIN HFA) 108 (90 BASE) MCG/ACT inhaler Inhale 2 puffs into the lungs every 6 (six) hours as needed for wheezing or shortness of breath.    Historical Provider, MD  aspirin EC 81 MG tablet Take 81 mg by mouth at bedtime.    Historical Provider, MD  atorvastatin (LIPITOR) 40 MG tablet Take 40 mg by  mouth at bedtime.     Historical Provider, MD  BESIVANCE 0.6 % SUSP Reported on 11/24/2015 10/04/15   Historical Provider, MD  Cholecalciferol (VITAMIN D3) 5000 units TABS Take 5,000 Units by mouth daily.    Historical Provider, MD  docusate sodium (COLACE) 100 MG capsule Take 2 capsules (200 mg total) by mouth 2 (two) times daily. 07/12/15   Carrie Mew, MD  doxazosin (CARDURA) 8 MG tablet Take 8 mg by mouth at bedtime.     Historical Provider, MD  DUREZOL 0.05 % EMUL Reported on 11/24/2015 09/15/15   Historical Provider, MD  ferrous sulfate 325 (65 FE) MG tablet Take 325 mg by mouth 2 (two) times daily.     Historical Provider, MD  hydrALAZINE (APRESOLINE) 25 MG tablet Take 1 tablet (25 mg total) by mouth every 8 (eight) hours. 07/23/15   Theodoro Grist, MD  ILEVRO 0.3 % ophthalmic suspension Reported on 11/24/2015 09/15/15   Historical Provider, MD  isosorbide mononitrate (IMDUR) 30 MG 24 hr tablet Take 1 tablet (30 mg total) by mouth daily. 07/23/15   Theodoro Grist, MD  levothyroxine (SYNTHROID, LEVOTHROID) 100 MCG tablet Take 100 mcg by mouth daily.    Historical Provider, MD  Rolan Lipa 145 MCG CAPS capsule  03/14/16   Historical Provider, MD  losartan (COZAAR) 100 MG tablet Reported on 11/24/2015 10/21/15   Historical Provider, MD  metoprolol succinate (TOPROL-XL)  50 MG 24 hr tablet Take 50 mg by mouth daily.     Historical Provider, MD  nitroGLYCERIN (NITROSTAT) 0.4 MG SL tablet Place 0.4 mg under the tongue every 5 (five) minutes as needed for chest pain.    Historical Provider, MD  omega-3 acid ethyl esters (LOVAZA) 1 G capsule Take 1 capsule by mouth 2 (two) times daily.    Historical Provider, MD  pantoprazole (PROTONIX) 40 MG tablet Take 40 mg by mouth 2 (two) times daily.     Historical Provider, MD  polyethylene glycol powder (GLYCOLAX/MIRALAX) powder 2 cap fulls in a full glass of water, three times a day, for 5 days. 07/12/15   Carrie Mew, MD  potassium chloride (K-DUR,KLOR-CON) 10 MEQ tablet   08/18/15   Historical Provider, MD  senna (SENOKOT) 8.6 MG TABS tablet Take 2 tablets (17.2 mg total) by mouth 2 (two) times daily. 07/12/15   Carrie Mew, MD  sodium bicarbonate 650 MG tablet Take 650 mg by mouth 2 (two) times daily.    Historical Provider, MD  sucralfate (CARAFATE) 1 g tablet Take 1 g by mouth 4 (four) times daily -  with meals and at bedtime.    Historical Provider, MD  tamsulosin (FLOMAX) 0.4 MG CAPS capsule Take 0.4 mg by mouth daily.    Historical Provider, MD  torsemide (DEMADEX) 20 MG tablet  10/27/15   Historical Provider, MD    Allergies Ativan [lorazepam] and Penicillin g    Social History Social History  Substance Use Topics  . Smoking status: Former Smoker    Types: Cigarettes, Cigars  . Smokeless tobacco: Current User    Types: Chew  . Alcohol use No    Review of Systems Patient denies headaches, rhinorrhea, blurry vision, numbness, shortness of breath, chest pain, edema, cough, abdominal pain, nausea, vomiting, diarrhea, dysuria, fevers, rashes or hallucinations unless otherwise stated above in HPI. ____________________________________________   PHYSICAL EXAM:  VITAL SIGNS: Vitals:   06/25/16 0015 06/25/16 0045  BP: (!) 107/58 (!) 101/59  Pulse: (!) 49 (!) 139  Resp: (!) 30 (!) 30  Temp:      Constitutional: Alert and oriented. Acutely ill appearing.  Eyes: Conjunctivae are normal. PERRL. EOMI. Head: Atraumatic. Nose: No congestion/rhinnorhea. Mouth/Throat: Mucous membranes are moist.  Oropharynx non-erythematous. Neck: No stridor. Painless ROM. No cervical spine tenderness to palpation Hematological/Lymphatic/Immunilogical: No cervical lymphadenopathy. Cardiovascular: Normal rate, regular rhythm. Grossly normal heart sounds.  Good peripheral circulation. Respiratory: moderate respiratory distress with tachypnea, diffuse rhonchi, faint wheezing Gastrointestinal: Soft and nontender. No distention. No abdominal bruits. No CVA tenderness.  Guaiac negative  Musculoskeletal: No lower extremity tenderness nor edema.  No joint effusions. Neurologic:  Normal speech and language. No gross focal neurologic deficits are appreciated. No gait instability. Skin:  Skin is pale, warm, dry and intact. No rash noted.  ____________________________________________   LABS (all labs ordered are listed, but only abnormal results are displayed)  Results for orders placed or performed during the hospital encounter of 06/24/16 (from the past 24 hour(s))  CBC with Differential     Status: Abnormal   Collection Time: 06/25/16 12:09 AM  Result Value Ref Range   WBC 5.7 3.8 - 10.6 K/uL   RBC 4.37 (L) 4.40 - 5.90 MIL/uL   Hemoglobin 13.4 13.0 - 18.0 g/dL   HCT 38.8 (L) 40.0 - 52.0 %   MCV 88.7 80.0 - 100.0 fL   MCH 30.7 26.0 - 34.0 pg   MCHC 34.6 32.0 - 36.0 g/dL  RDW 14.8 (H) 11.5 - 14.5 %   Platelets 132 (L) 150 - 440 K/uL   Neutrophils Relative % 87 %   Neutro Abs 5.0 1.4 - 6.5 K/uL   Lymphocytes Relative 4 %   Lymphs Abs 0.2 (L) 1.0 - 3.6 K/uL   Monocytes Relative 9 %   Monocytes Absolute 0.5 0.2 - 1.0 K/uL   Eosinophils Relative 0 %   Eosinophils Absolute 0.0 0 - 0.7 K/uL   Basophils Relative 0 %   Basophils Absolute 0.0 0 - 0.1 K/uL  Comprehensive metabolic panel     Status: Abnormal   Collection Time: 06/25/16 12:09 AM  Result Value Ref Range   Sodium 133 (L) 135 - 145 mmol/L   Potassium 3.9 3.5 - 5.1 mmol/L   Chloride 103 101 - 111 mmol/L   CO2 20 (L) 22 - 32 mmol/L   Glucose, Bld 101 (H) 65 - 99 mg/dL   BUN 35 (H) 6 - 20 mg/dL   Creatinine, Ser 3.10 (H) 0.61 - 1.24 mg/dL   Calcium 8.8 (L) 8.9 - 10.3 mg/dL   Total Protein 6.3 (L) 6.5 - 8.1 g/dL   Albumin 3.3 (L) 3.5 - 5.0 g/dL   AST 26 15 - 41 U/L   ALT 15 (L) 17 - 63 U/L   Alkaline Phosphatase 42 38 - 126 U/L   Total Bilirubin 1.5 (H) 0.3 - 1.2 mg/dL   GFR calc non Af Amer 17 (L) >60 mL/min   GFR calc Af Amer 19 (L) >60 mL/min   Anion gap 10 5 - 15  Lactic acid,  plasma     Status: Abnormal   Collection Time: 06/25/16 12:09 AM  Result Value Ref Range   Lactic Acid, Venous 2.4 (HH) 0.5 - 1.9 mmol/L  Type and screen [facility]     Status: None   Collection Time: 06/25/16 12:09 AM  Result Value Ref Range   ABO/RH(D) AB POS    Antibody Screen NEG    Sample Expiration 06/28/2016   Protime-INR     Status: None   Collection Time: 06/25/16 12:09 AM  Result Value Ref Range   Prothrombin Time 14.5 11.4 - 15.2 seconds   INR 1.12   Troponin I     Status: Abnormal   Collection Time: 06/25/16 12:09 AM  Result Value Ref Range   Troponin I 0.04 (HH) <0.03 ng/mL   ____________________________________________  EKG My review and personal interpretation at Time: 22:30   Indication: hypotension  Rate: 100  Rhythm: sinus Axis: normal Other: non specific st changes consistent with previous 07/13/15.  RBBB ____________________________________________  RADIOLOGY  I personally reviewed all radiographic images ordered to evaluate for the above acute complaints and reviewed radiology reports and findings.  These findings were personally discussed with the patient.  Please see medical record for radiology report.  ____________________________________________   PROCEDURES  Procedure(s) performed: none Procedures    Critical Care performed: yes CRITICAL CARE Performed by: Merlyn Lot   Total critical care time: 47 minutes  Critical care time was exclusive of separately billable procedures and treating other patients.  Critical care was necessary to treat or prevent imminent or life-threatening deterioration.  Critical care was time spent personally by me on the following activities: development of treatment plan with patient and/or surrogate as well as nursing, discussions with consultants, evaluation of patient's response to treatment, examination of patient, obtaining history from patient or surrogate, ordering and performing  treatments and interventions, ordering and review  of laboratory studies, ordering and review of radiographic studies, pulse oximetry and re-evaluation of patient's condition.  ____________________________________________   INITIAL IMPRESSION / ASSESSMENT AND PLAN / ED COURSE  Pertinent labs & imaging results that were available during my care of the patient were reviewed by me and considered in my medical decision making (see chart for details).  DDX: anemia, dehydration, sepsis, obstruction, perf, pe  [name] is a 82 y.o. who presents to the ED with above complaints with very complex past medical history. He does arrive critically ill appearing. Moderate respiratory distress appearing pale, to But without any hypoxia. Mildly tachycardic and was mildly hypotensive upon arrival to triage. Denies any chest pain at this time an EKG shows no evidence of acute ischemia or significant changes from previous EKG. His abdominal exam is diffusely tender with hypoactive bowel sounds. We'll provide gentle IV fluid resuscitation as the patient does have a history of congestive heart failure.  Clinical Course    ----------------------------------------- 1:16 AM on 06/25/2016 -----------------------------------------  Patient with chronic renal failure. Blood pressure mildly improving after gentle IV hydration. Chest x-ray shows no acute abnormality. Troponin is mildly elevated with patient denies any chest pain. Patient is currently receiving realizing treatments and subsequently developing a sinus tachycardia. Patient with persistent tachypnea but no acute hypoxia. No acute blood loss anemia in his stool guaiac is negative this time. Unable to CT image chest for pulmonary embolus and based on his renal function. Etiology of acute decompensation is still uncertain at this time. Possible diverticulitis versus intra-abdominal process. Awaiting fluid as well as  urinalysis.   ----------------------------------------- 1:43 AM on 06/25/2016 -----------------------------------------  The patient now into rapid A. fib. Remains normotensive. IV fluids infusing. Patient does take metoprolol for paroxysmal A. fib and did not take it this evening. Will dose that with Lopressor now. My review of CT imaging is concerning for some sort of acute inflammatory process in the right upper quadrant concerning for acute cholecystitis. We will order Cipro Flagyl. Discussed case with Dr. Dineen Kid will await CT imaging. ____________________________________________   FINAL CLINICAL IMPRESSION(S) / ED DIAGNOSES  Final diagnoses:  Abdominal pain, unspecified abdominal location  Hypotension, unspecified hypotension type  Tachypnea  Lactic acidosis      NEW MEDICATIONS STARTED DURING THIS VISIT:  New Prescriptions   No medications on file     Note:  This document was prepared using Dragon voice recognition software and may include unintentional dictation errors.    Merlyn Lot, MD 06/25/16 [phone]

## 2016-06-24 NOTE — ED Triage Notes (Signed)
Per wife, pt had lupron shot with Dr. Jeananne Rama for cancer last week.  Pt has had constipation since last week.  Pt states yesterday his chest and lower abdomen started to hurt.  Pt states abdomen continues to hurt and he has some shortness of breath.  Pt states that his chest no longer hurts, but that it now hurts directly behind his heart on his back.  Pt appears pale in triage.

## 2016-06-25 ENCOUNTER — Emergency Department: Payer: Medicare Other

## 2016-06-25 ENCOUNTER — Inpatient Hospital Stay: Payer: Medicare Other

## 2016-06-25 ENCOUNTER — Inpatient Hospital Stay
Admit: 2016-06-25 | Discharge: 2016-06-25 | Disposition: A | Discharge status: Home / Self Care | Payer: Medicare Other | Attending: Cardiovascular Disease | Admitting: Cardiovascular Disease

## 2016-06-25 ENCOUNTER — Other Ambulatory Visit: Payer: Self-pay | Admitting: Internal Medicine

## 2016-06-25 DIAGNOSIS — I248 Other forms of acute ischemic heart disease: Secondary | ICD-10-CM | POA: Diagnosis present

## 2016-06-25 DIAGNOSIS — E7800 Pure hypercholesterolemia, unspecified: Secondary | ICD-10-CM | POA: Diagnosis present

## 2016-06-25 DIAGNOSIS — K810 Acute cholecystitis: Secondary | ICD-10-CM | POA: Diagnosis not present

## 2016-06-25 DIAGNOSIS — I5022 Chronic systolic (congestive) heart failure: Secondary | ICD-10-CM | POA: Diagnosis present

## 2016-06-25 DIAGNOSIS — N184 Chronic kidney disease, stage 4 (severe): Secondary | ICD-10-CM | POA: Diagnosis present

## 2016-06-25 DIAGNOSIS — I4510 Unspecified right bundle-branch block: Secondary | ICD-10-CM | POA: Diagnosis present

## 2016-06-25 DIAGNOSIS — C61 Malignant neoplasm of prostate: Secondary | ICD-10-CM | POA: Diagnosis present

## 2016-06-25 DIAGNOSIS — K567 Ileus, unspecified: Secondary | ICD-10-CM | POA: Diagnosis not present

## 2016-06-25 DIAGNOSIS — G9340 Encephalopathy, unspecified: Secondary | ICD-10-CM | POA: Diagnosis not present

## 2016-06-25 DIAGNOSIS — I130 Hypertensive heart and chronic kidney disease with heart failure and stage 1 through stage 4 chronic kidney disease, or unspecified chronic kidney disease: Secondary | ICD-10-CM | POA: Diagnosis present

## 2016-06-25 DIAGNOSIS — E039 Hypothyroidism, unspecified: Secondary | ICD-10-CM | POA: Diagnosis present

## 2016-06-25 DIAGNOSIS — Z7982 Long term (current) use of aspirin: Secondary | ICD-10-CM | POA: Diagnosis not present

## 2016-06-25 DIAGNOSIS — E872 Acidosis: Secondary | ICD-10-CM | POA: Diagnosis present

## 2016-06-25 DIAGNOSIS — D696 Thrombocytopenia, unspecified: Secondary | ICD-10-CM | POA: Diagnosis not present

## 2016-06-25 DIAGNOSIS — Z85828 Personal history of other malignant neoplasm of skin: Secondary | ICD-10-CM | POA: Diagnosis not present

## 2016-06-25 DIAGNOSIS — A419 Sepsis, unspecified organism: Secondary | ICD-10-CM | POA: Diagnosis present

## 2016-06-25 DIAGNOSIS — I480 Paroxysmal atrial fibrillation: Secondary | ICD-10-CM | POA: Diagnosis present

## 2016-06-25 DIAGNOSIS — Z79899 Other long term (current) drug therapy: Secondary | ICD-10-CM | POA: Diagnosis not present

## 2016-06-25 DIAGNOSIS — M549 Dorsalgia, unspecified: Secondary | ICD-10-CM | POA: Diagnosis present

## 2016-06-25 DIAGNOSIS — R319 Hematuria, unspecified: Secondary | ICD-10-CM | POA: Diagnosis present

## 2016-06-25 DIAGNOSIS — I959 Hypotension, unspecified: Secondary | ICD-10-CM | POA: Diagnosis present

## 2016-06-25 DIAGNOSIS — I482 Chronic atrial fibrillation: Secondary | ICD-10-CM | POA: Diagnosis present

## 2016-06-25 DIAGNOSIS — R339 Retention of urine, unspecified: Secondary | ICD-10-CM | POA: Diagnosis present

## 2016-06-25 DIAGNOSIS — K8000 Calculus of gallbladder with acute cholecystitis without obstruction: Secondary | ICD-10-CM | POA: Diagnosis present

## 2016-06-25 DIAGNOSIS — F17220 Nicotine dependence, chewing tobacco, uncomplicated: Secondary | ICD-10-CM | POA: Diagnosis present

## 2016-06-25 LAB — URINALYSIS, COMPLETE (UACMP) WITH MICROSCOPIC
BILIRUBIN URINE: NEGATIVE
Glucose, UA: 50 mg/dL — AB
KETONES UR: NEGATIVE mg/dL
LEUKOCYTES UA: NEGATIVE
NITRITE: NEGATIVE
PH: 5 (ref 5.0–8.0)
Protein, ur: 30 mg/dL — AB
SPECIFIC GRAVITY, URINE: 1.013 (ref 1.005–1.030)

## 2016-06-25 LAB — TYPE AND SCREEN
ABO/RH(D): AB POS
ANTIBODY SCREEN: NEGATIVE

## 2016-06-25 LAB — COMPREHENSIVE METABOLIC PANEL
ALBUMIN: 3.3 g/dL — AB (ref 3.5–5.0)
ALK PHOS: 42 U/L (ref 38–126)
ALT: 15 U/L — ABNORMAL LOW (ref 17–63)
ANION GAP: 10 (ref 5–15)
AST: 26 U/L (ref 15–41)
BUN: 35 mg/dL — ABNORMAL HIGH (ref 6–20)
CALCIUM: 8.8 mg/dL — AB (ref 8.9–10.3)
CO2: 20 mmol/L — AB (ref 22–32)
Chloride: 103 mmol/L (ref 101–111)
Creatinine, Ser: 3.1 mg/dL — ABNORMAL HIGH (ref 0.61–1.24)
GFR calc Af Amer: 19 mL/min — ABNORMAL LOW (ref >60)
GFR calc non Af Amer: 17 mL/min — ABNORMAL LOW (ref >60)
GLUCOSE: 101 mg/dL — AB (ref 65–99)
Potassium: 3.9 mmol/L (ref 3.5–5.1)
SODIUM: 133 mmol/L — AB (ref 135–145)
Total Bilirubin: 1.5 mg/dL — ABNORMAL HIGH (ref 0.3–1.2)
Total Protein: 6.3 g/dL — ABNORMAL LOW (ref 6.5–8.1)

## 2016-06-25 LAB — INFLUENZA PANEL BY PCR (TYPE A & B)
INFLAPCR: NEGATIVE
INFLBPCR: NEGATIVE

## 2016-06-25 LAB — CBC WITH DIFFERENTIAL/PLATELET
BASOS ABS: 0 10*3/uL (ref 0–0.1)
BASOS PCT: 0 %
EOS ABS: 0 10*3/uL (ref 0–0.7)
Eosinophils Relative: 0 %
HCT: 38.8 % — ABNORMAL LOW (ref 40.0–52.0)
HEMOGLOBIN: 13.4 g/dL (ref 13.0–18.0)
Lymphocytes Relative: 4 %
Lymphs Abs: 0.2 10*3/uL — ABNORMAL LOW (ref 1.0–3.6)
MCH: 30.7 pg (ref 26.0–34.0)
MCHC: 34.6 g/dL (ref 32.0–36.0)
MCV: 88.7 fL (ref 80.0–100.0)
MONOS PCT: 9 %
Monocytes Absolute: 0.5 10*3/uL (ref 0.2–1.0)
NEUTROS PCT: 87 %
Neutro Abs: 5 10*3/uL (ref 1.4–6.5)
Platelets: 132 10*3/uL — ABNORMAL LOW (ref 150–440)
RBC: 4.37 MIL/uL — AB (ref 4.40–5.90)
RDW: 14.8 % — ABNORMAL HIGH (ref 11.5–14.5)
WBC: 5.7 10*3/uL (ref 3.8–10.6)

## 2016-06-25 LAB — TROPONIN I: Troponin I: 0.04 ng/mL (ref <0.03)

## 2016-06-25 LAB — LACTIC ACID, PLASMA
LACTIC ACID, VENOUS: 2.4 mmol/L — AB (ref 0.5–1.9)
LACTIC ACID, VENOUS: 2.9 mmol/L — AB (ref 0.5–1.9)
Lactic Acid, Venous: 1 mmol/L (ref 0.5–1.9)
Lactic Acid, Venous: 2.4 mmol/L (ref 0.5–1.9)

## 2016-06-25 LAB — TSH: TSH: 1.438 u[IU]/mL (ref 0.350–4.500)

## 2016-06-25 LAB — PROTIME-INR
INR: 1.12
Prothrombin Time: 14.5 seconds (ref 11.4–15.2)

## 2016-06-25 MED ORDER — DOXAZOSIN MESYLATE 4 MG PO TABS
4.0000 mg | ORAL_TABLET | Freq: Every day | ORAL | Status: DC
  Administered 2016-06-25 – 2016-06-29 (×4): 4 mg via ORAL
  Filled 2016-06-25 (×5): qty 1

## 2016-06-25 MED ORDER — METOPROLOL SUCCINATE ER 50 MG PO TB24: 50.0000 mg | ORAL_TABLET | Freq: Every day | ORAL | Status: DC

## 2016-06-25 MED ORDER — PANTOPRAZOLE SODIUM 40 MG PO TBEC
40.0000 mg | DELAYED_RELEASE_TABLET | Freq: Two times a day (BID) | ORAL | Status: DC
  Administered 2016-06-25 – 2016-06-30 (×10): 40 mg via ORAL
  Filled 2016-06-25 (×10): qty 1

## 2016-06-25 MED ORDER — ISOSORBIDE MONONITRATE ER 30 MG PO TB24
30.0000 mg | ORAL_TABLET | Freq: Every day | ORAL | Status: DC
  Administered 2016-06-25 – 2016-06-27 (×3): 30 mg via ORAL
  Filled 2016-06-25 (×3): qty 1

## 2016-06-25 MED ORDER — POTASSIUM CHLORIDE CRYS ER 20 MEQ PO TBCR
20.0000 meq | EXTENDED_RELEASE_TABLET | Freq: Two times a day (BID) | ORAL | Status: DC
  Administered 2016-06-25 – 2016-06-30 (×10): 20 meq via ORAL
  Filled 2016-06-25 (×10): qty 1

## 2016-06-25 MED ORDER — ASPIRIN EC 81 MG PO TBEC
81.0000 mg | DELAYED_RELEASE_TABLET | Freq: Every day | ORAL | Status: DC
  Administered 2016-06-25 – 2016-06-29 (×5): 81 mg via ORAL
  Filled 2016-06-25 (×5): qty 1

## 2016-06-25 MED ORDER — ALBUTEROL SULFATE (2.5 MG/3ML) 0.083% IN NEBU
2.5000 mg | INHALATION_SOLUTION | Freq: Four times a day (QID) | RESPIRATORY_TRACT | Status: DC | PRN
  Administered 2016-06-26: 2.5 mg via RESPIRATORY_TRACT
  Filled 2016-06-25: qty 3

## 2016-06-25 MED ORDER — OMEGA-3-ACID ETHYL ESTERS 1 G PO CAPS
1.0000 | ORAL_CAPSULE | Freq: Two times a day (BID) | ORAL | Status: DC
  Administered 2016-06-25 – 2016-06-30 (×10): 1 g via ORAL
  Filled 2016-06-25 (×10): qty 1

## 2016-06-25 MED ORDER — SODIUM CHLORIDE 0.9 % IV BOLUS (SEPSIS)
500.0000 mL | Freq: Once | INTRAVENOUS | Status: AC
  Administered 2016-06-25: 500 mL via INTRAVENOUS

## 2016-06-25 MED ORDER — ATORVASTATIN CALCIUM 20 MG PO TABS
40.0000 mg | ORAL_TABLET | Freq: Every day | ORAL | Status: DC
Start: 2016-06-25 — End: 2016-06-30
  Administered 2016-06-25 – 2016-06-29 (×5): 40 mg via ORAL
  Filled 2016-06-25 (×5): qty 2

## 2016-06-25 MED ORDER — METHYLPREDNISOLONE SODIUM SUCC 125 MG IJ SOLR
60.0000 mg | Freq: Once | INTRAMUSCULAR | Status: AC
  Administered 2016-06-25: 60 mg via INTRAVENOUS
  Filled 2016-06-25: qty 2

## 2016-06-25 MED ORDER — MEROPENEM-SODIUM CHLORIDE 500 MG/50ML IV SOLR
500.0000 mg | Freq: Two times a day (BID) | INTRAVENOUS | Status: DC
  Administered 2016-06-25 – 2016-06-30 (×11): 500 mg via INTRAVENOUS
  Filled 2016-06-25 (×12): qty 50

## 2016-06-25 MED ORDER — METOPROLOL TARTRATE 50 MG PO TABS
50.0000 mg | ORAL_TABLET | Freq: Two times a day (BID) | ORAL | Status: DC
  Administered 2016-06-25 – 2016-06-27 (×5): 50 mg via ORAL
  Filled 2016-06-25 (×6): qty 1

## 2016-06-25 MED ORDER — HEPARIN SODIUM (PORCINE) 5000 UNIT/ML IJ SOLN
5000.0000 [IU] | Freq: Three times a day (TID) | INTRAMUSCULAR | Status: DC
  Administered 2016-06-25 – 2016-06-28 (×10): 5000 [IU] via SUBCUTANEOUS
  Filled 2016-06-25 (×10): qty 1

## 2016-06-25 MED ORDER — FUROSEMIDE 10 MG/ML IJ SOLN
20.0000 mg | Freq: Once | INTRAMUSCULAR | Status: AC
  Administered 2016-06-25: 20 mg via INTRAVENOUS
  Filled 2016-06-25: qty 2

## 2016-06-25 MED ORDER — ACETAMINOPHEN 650 MG RE SUPP: 650.0000 mg | Freq: Four times a day (QID) | RECTAL | Status: DC | PRN

## 2016-06-25 MED ORDER — DOCUSATE SODIUM 100 MG PO CAPS
100.0000 mg | ORAL_CAPSULE | Freq: Two times a day (BID) | ORAL | Status: DC
  Administered 2016-06-25 – 2016-06-30 (×10): 100 mg via ORAL
  Filled 2016-06-25 (×10): qty 1

## 2016-06-25 MED ORDER — FERROUS SULFATE 325 (65 FE) MG PO TABS
325.0000 mg | ORAL_TABLET | Freq: Two times a day (BID) | ORAL | Status: DC
  Administered 2016-06-25 – 2016-06-30 (×10): 325 mg via ORAL
  Filled 2016-06-25 (×10): qty 1

## 2016-06-25 MED ORDER — SODIUM BICARBONATE 650 MG PO TABS
1300.0000 mg | ORAL_TABLET | Freq: Two times a day (BID) | ORAL | Status: DC
  Administered 2016-06-25 – 2016-06-30 (×10): 1300 mg via ORAL
  Filled 2016-06-25 (×10): qty 2

## 2016-06-25 MED ORDER — LEVOTHYROXINE SODIUM 50 MCG PO TABS
100.0000 ug | ORAL_TABLET | Freq: Every day | ORAL | Status: DC
  Administered 2016-06-25 – 2016-06-30 (×6): 100 ug via ORAL
  Filled 2016-06-25 (×5): qty 2

## 2016-06-25 MED ORDER — TORSEMIDE 20 MG PO TABS
20.0000 mg | ORAL_TABLET | Freq: Two times a day (BID) | ORAL | Status: DC
  Administered 2016-06-25 – 2016-06-27 (×4): 20 mg via ORAL
  Filled 2016-06-25 (×4): qty 1

## 2016-06-25 MED ORDER — HYDRALAZINE HCL 25 MG PO TABS
25.0000 mg | ORAL_TABLET | Freq: Two times a day (BID) | ORAL | Status: DC
  Administered 2016-06-25 – 2016-06-26 (×3): 25 mg via ORAL
  Filled 2016-06-25 (×4): qty 1

## 2016-06-25 MED ORDER — NITROGLYCERIN 0.4 MG SL SUBL: 0.4000 mg | SUBLINGUAL_TABLET | SUBLINGUAL | Status: DC | PRN

## 2016-06-25 MED ORDER — VITAMIN D 1000 UNITS PO TABS
5000.0000 [IU] | ORAL_TABLET | Freq: Every day | ORAL | Status: DC
  Administered 2016-06-26 – 2016-06-29 (×4): 5000 [IU] via ORAL
  Filled 2016-06-25 (×4): qty 5

## 2016-06-25 MED ORDER — METRONIDAZOLE IN NACL 5-0.79 MG/ML-% IV SOLN
500.0000 mg | Freq: Once | INTRAVENOUS | Status: AC
  Administered 2016-06-25: 500 mg via INTRAVENOUS
  Filled 2016-06-25: qty 100

## 2016-06-25 MED ORDER — MORPHINE SULFATE (PF) 4 MG/ML IV SOLN
2.0000 mg | INTRAVENOUS | Status: DC | PRN
  Administered 2016-06-25: 2 mg via INTRAVENOUS
  Filled 2016-06-25: qty 1

## 2016-06-25 MED ORDER — TECHNETIUM TC 99M MEBROFENIN IV KIT
4.9200 | PACK | Freq: Once | INTRAVENOUS | Status: AC | PRN
  Administered 2016-06-25: 4.92 via INTRAVENOUS

## 2016-06-25 MED ORDER — SODIUM CHLORIDE 0.9 % IV SOLN
INTRAVENOUS | Status: DC
  Administered 2016-06-25 – 2016-06-26 (×3): via INTRAVENOUS

## 2016-06-25 MED ORDER — TAMSULOSIN HCL 0.4 MG PO CAPS
0.4000 mg | ORAL_CAPSULE | Freq: Every day | ORAL | Status: DC
  Administered 2016-06-25 – 2016-06-30 (×6): 0.4 mg via ORAL
  Filled 2016-06-25 (×6): qty 1

## 2016-06-25 MED ORDER — SODIUM CHLORIDE 0.9% FLUSH
3.0000 mL | Freq: Two times a day (BID) | INTRAVENOUS | Status: DC
  Administered 2016-06-25 – 2016-06-30 (×5): 3 mL via INTRAVENOUS

## 2016-06-25 MED ORDER — CIPROFLOXACIN IN D5W 400 MG/200ML IV SOLN
400.0000 mg | Freq: Once | INTRAVENOUS | Status: AC
  Administered 2016-06-25: 400 mg via INTRAVENOUS
  Filled 2016-06-25: qty 200

## 2016-06-25 MED ORDER — ACETAMINOPHEN 325 MG PO TABS: 650.0000 mg | ORAL_TABLET | Freq: Four times a day (QID) | ORAL | Status: DC | PRN

## 2016-06-25 MED ORDER — METOPROLOL TARTRATE 5 MG/5ML IV SOLN
5.0000 mg | INTRAVENOUS | Status: DC | PRN
  Filled 2016-06-25: qty 5

## 2016-06-25 NOTE — Progress Notes (Signed)
CC: Abdominal pain Subjective: Patient reports continued intermittent abdominal pains. Does not state he gets any worse or any better. Denies any nausea or vomiting.  Objective: Vital signs in last 24 hours: Temp:  [98.1 F (36.7 C)-98.7 F (37.1 C)] 98.1 F (36.7 C) (12/18 1300) Pulse Rate:  [49-139] 90 (12/18 1559) Resp:  [18-33] 19 (12/18 1300) BP: (86-134)/(48-73) 128/73 (12/18 1300) SpO2:  [94 %-100 %] 98 % (12/18 1300) Weight:  [75.8 kg (167 lb)] 75.8 kg (167 lb) (12/17 2229) Last BM Date: 06/24/16  Intake/Output from previous day: 12/17 0701 - 12/18 0700 In: 1050 [IV Piggyback:1050] Out: -  Intake/Output this shift: Total I/O In: 1570 [I.V.:1570] Out: 575 [Urine:575]  Physical exam:  Gen.: No acute distress Chest: Clear to auscultation Heart: Regular rate and rhythm Abdomen: Large, soft, tender to palpation in the right upper quadrant, nondistended  Lab Results: CBC   Recent Labs  06/25/16 0009  WBC 5.7  HGB 13.4  HCT 38.8*  PLT 132*   BMET  Recent Labs  06/25/16 0009  NA 133*  K 3.9  CL 103  CO2 20*  GLUCOSE 101*  BUN 35*  CREATININE 3.10*  CALCIUM 8.8*   PT/INR  Recent Labs  06/25/16 0009  LABPROT 14.5  INR 1.12   ABG No results for input(s): PHART, HCO3 in the last 72 hours.  Invalid input(s): PCO2, PO2  Studies/Results: Ct Abdomen Pelvis Wo Contrast  Result Date: 06/25/2016 CLINICAL DATA:  82 year old male with sepsis and abdominal pain. History of prostate cancer. EXAM: CT CHEST, ABDOMEN AND PELVIS WITHOUT CONTRAST TECHNIQUE: Multidetector CT imaging of the chest, abdomen and pelvis was performed following the standard protocol without IV contrast. COMPARISON:  None. FINDINGS: Evaluation of this exam is limited in the absence of intravenous contrast. CT CHEST FINDINGS Cardiovascular: There is no cardiomegaly or pericardial effusion. Multi vessel coronary vascular calcification involving the LAD, left circumflex, and RCA. There is  atherosclerotic calcification of the thoracic aorta. No aneurysmal dilatation. The central pulmonary arteries are grossly unremarkable on this noncontrast study. Mediastinum/Nodes: No hilar or mediastinal adenopathy. There is a moderate size hiatal hernia. The esophagus is grossly unremarkable. Subcentimeter right thyroid nodule. No mediastinal collection or hematoma. Lungs/Pleura: Bibasilar dependent atelectatic changes. Left lung base compressive atelectasis secondary to hiatal hernia. Pneumonia is less likely. There is no pleural effusion or pneumothorax. The central airways are patent. Musculoskeletal: No axillary adenopathy. The chest wall soft tissues appear unremarkable. There is osteopenia with degenerative changes of the spine. No acute fracture. CT ABDOMEN PELVIS FINDINGS No intra-abdominal free air. Small subhepatic and pericholecystic fluid. Hepatobiliary: The liver is grossly unremarkable. There multiple stones within the gallbladder fundus. A stone is noted at the gallbladder neck. There is diffuse gallbladder wall thickening and edema. A 5.6 x 6.3 cm fluid collection noted in the right upper abdomen inferior to the gallbladder. This collection does not demonstrate an organized wall or definite evidence of loculation. Pancreas: The pancreas is atrophic. No inflammatory changes of pancreas. Spleen: Normal in size without focal abnormality. Adrenals/Urinary Tract: The adrenal glands appear unremarkable. The left kidney is atrophic. There is a 15 mm stone in the left kidney. There are two adjacent stones in the distal left ureter measuring up to 6 mm. There is moderate left hydronephrosis. Punctate nonobstructing right renal upper pole calculus. There is no hydronephrosis on the right. The right ureter appears unremarkable. The urinary bladder is predominantly collapsed. There is apparent diffuse thickening of the bladder wall which may be  partly related to underdistention. Cystitis is not excluded.  Correlation with urinalysis recommended. Stomach/Bowel: There is extensive sigmoid diverticulosis without active inflammatory changes. Moderate size hiatal hernia noted. There is no evidence of bowel obstruction for active inflammation. The appendix appears unremarkable. Vascular/Lymphatic: There is moderate aortoiliac atherosclerotic disease. No aneurysmal dilatation. No portal venous gas identified. There is no adenopathy. Reproductive: The prostate and seminal vesicles are grossly unremarkable. Other: Small fat containing left inguinal hernia. No fluid collection. Musculoskeletal: Osteopenia.  No acute fracture. IMPRESSION: Cholelithiasis with findings most compatible with acute cholecystitis. A 4 mm stone at the neck of the gallbladder is concerning for an obstructing gallstone. Correlation with clinical exam recommended. Ultrasound may provide additional information. Small amount of fluid inferior to the gallbladder does not demonstrate loculation or organized wall. Atrophic left kidney. Two adjacent distal left ureteral calculi measuring up to 6 mm with moderate left hydronephrosis. A 15 mm left renal calculus is also noted. Punctate nonobstructing right renal upper pole stone. No hydronephrosis. Thickened bladder wall. Correlation with urinalysis recommended to evaluate for cystitis. Extensive colonic diverticulosis. No evidence of bowel obstruction or active inflammation. She Electronically Signed   By: Anner Crete M.D.   On: 06/25/2016 02:39   Ct Chest Wo Contrast  Result Date: 06/25/2016 CLINICAL DATA:  82 year old male with sepsis and abdominal pain. History of prostate cancer. EXAM: CT CHEST, ABDOMEN AND PELVIS WITHOUT CONTRAST TECHNIQUE: Multidetector CT imaging of the chest, abdomen and pelvis was performed following the standard protocol without IV contrast. COMPARISON:  None. FINDINGS: Evaluation of this exam is limited in the absence of intravenous contrast. CT CHEST FINDINGS  Cardiovascular: There is no cardiomegaly or pericardial effusion. Multi vessel coronary vascular calcification involving the LAD, left circumflex, and RCA. There is atherosclerotic calcification of the thoracic aorta. No aneurysmal dilatation. The central pulmonary arteries are grossly unremarkable on this noncontrast study. Mediastinum/Nodes: No hilar or mediastinal adenopathy. There is a moderate size hiatal hernia. The esophagus is grossly unremarkable. Subcentimeter right thyroid nodule. No mediastinal collection or hematoma. Lungs/Pleura: Bibasilar dependent atelectatic changes. Left lung base compressive atelectasis secondary to hiatal hernia. Pneumonia is less likely. There is no pleural effusion or pneumothorax. The central airways are patent. Musculoskeletal: No axillary adenopathy. The chest wall soft tissues appear unremarkable. There is osteopenia with degenerative changes of the spine. No acute fracture. CT ABDOMEN PELVIS FINDINGS No intra-abdominal free air. Small subhepatic and pericholecystic fluid. Hepatobiliary: The liver is grossly unremarkable. There multiple stones within the gallbladder fundus. A stone is noted at the gallbladder neck. There is diffuse gallbladder wall thickening and edema. A 5.6 x 6.3 cm fluid collection noted in the right upper abdomen inferior to the gallbladder. This collection does not demonstrate an organized wall or definite evidence of loculation. Pancreas: The pancreas is atrophic. No inflammatory changes of pancreas. Spleen: Normal in size without focal abnormality. Adrenals/Urinary Tract: The adrenal glands appear unremarkable. The left kidney is atrophic. There is a 15 mm stone in the left kidney. There are two adjacent stones in the distal left ureter measuring up to 6 mm. There is moderate left hydronephrosis. Punctate nonobstructing right renal upper pole calculus. There is no hydronephrosis on the right. The right ureter appears unremarkable. The urinary bladder  is predominantly collapsed. There is apparent diffuse thickening of the bladder wall which may be partly related to underdistention. Cystitis is not excluded. Correlation with urinalysis recommended. Stomach/Bowel: There is extensive sigmoid diverticulosis without active inflammatory changes. Moderate size hiatal hernia noted. There is  no evidence of bowel obstruction for active inflammation. The appendix appears unremarkable. Vascular/Lymphatic: There is moderate aortoiliac atherosclerotic disease. No aneurysmal dilatation. No portal venous gas identified. There is no adenopathy. Reproductive: The prostate and seminal vesicles are grossly unremarkable. Other: Small fat containing left inguinal hernia. No fluid collection. Musculoskeletal: Osteopenia.  No acute fracture. IMPRESSION: Cholelithiasis with findings most compatible with acute cholecystitis. A 4 mm stone at the neck of the gallbladder is concerning for an obstructing gallstone. Correlation with clinical exam recommended. Ultrasound may provide additional information. Small amount of fluid inferior to the gallbladder does not demonstrate loculation or organized wall. Atrophic left kidney. Two adjacent distal left ureteral calculi measuring up to 6 mm with moderate left hydronephrosis. A 15 mm left renal calculus is also noted. Punctate nonobstructing right renal upper pole stone. No hydronephrosis. Thickened bladder wall. Correlation with urinalysis recommended to evaluate for cystitis. Extensive colonic diverticulosis. No evidence of bowel obstruction or active inflammation. She Electronically Signed   By: Anner Crete M.D.   On: 06/25/2016 02:39   Nm Hepatobiliary Liver Func  Result Date: 06/25/2016 CLINICAL DATA:  Abdominal pain 3 days. Cholelithiasis and acute cholecystitis. EXAM: NUCLEAR MEDICINE HEPATOBILIARY IMAGING TECHNIQUE: Sequential images of the abdomen were obtained out to 60 minutes following intravenous administration of  radiopharmaceutical. RADIOPHARMACEUTICALS:  4.9 mCi Tc-68m  Choletec IV COMPARISON:  CT on 06/25/2016 FINDINGS: Prompt uptake and biliary excretion of activity by the liver is seen. Gallbladder activity is visualized, consistent with patency of cystic duct. Biliary activity passes into small bowel, consistent with patent common bile duct. IMPRESSION: Patency of both cystic and common bile ducts demonstrated (despite findings of acute cholecystitis on recent CT). See previous CT report. Electronically Signed   By: Earle Gell M.D.   On: 06/25/2016 15:00   Dg Chest Portable 1 View  Result Date: 06/25/2016 CLINICAL DATA:  82 year old male with acute respiratory distress. EXAM: PORTABLE CHEST 1 VIEW COMPARISON:  Chest radiograph dated 12/01/2010 and abdominal CT dated 07/12/2015 FINDINGS: Mild diffuse interstitial coarsening. No focal consolidation, pleural effusion, or pneumothorax. Moderate size hiatal hernia. Stable cardiac silhouette. No acute osseous pathology. IMPRESSION: No acute cardiopulmonary process. Moderate hiatal hernia. Electronically Signed   By: Anner Crete M.D.   On: 06/25/2016 00:50    Anti-infectives: Anti-infectives    Start     Dose/Rate Route Frequency Ordered Stop   06/25/16 0630  meropenem (MERREM) IVPB SOLR 500 mg     500 mg 100 mL/hr over 30 Minutes Intravenous Every 12 hours 06/25/16 0620     06/25/16 0145  ciprofloxacin (CIPRO) IVPB 400 mg     400 mg 200 mL/hr over 60 Minutes Intravenous  Once 06/25/16 0134 06/25/16 0413   06/25/16 0145  metroNIDAZOLE (FLAGYL) IVPB 500 mg     500 mg 100 mL/hr over 60 Minutes Intravenous  Once 06/25/16 0134 06/25/16 0413      Assessment/Plan:  82 year old male admitted with what appears to be acute cholecystitis. HIDA scan very reassuring with visualization of the gallbladder itself. Given this finding discussed with the patient and his family that an urgent cholecystostomy tube is not currently needed. Discussed that should he  worsen or fail to completely improve that a cholecystostomy tube would be the first attempt to alleviate his cholecystitis due to his being a poor operative candidate. Surgery will continue to follow with you.  Ellaree Gear T. Adonis Huguenin, MD, FACS  06/25/2016

## 2016-06-25 NOTE — ED Notes (Signed)
Dr. Marcille Blanco made aware of critical LA

## 2016-06-25 NOTE — ED Provider Notes (Addendum)
Signout from Dr. Quentin Cornwall in this 82 year old male to follow-up with his CAT scans. Appears to be septic.  Physical Exam  BP (!) 101/59   Pulse (!) 139   Temp 98.4 F (36.9 C) (Oral)   Resp (!) 30   Ht 5\' 8"  (1.727 m)   Wt 167 lb (75.8 kg)   SpO2 97%   BMI 25.39 kg/m  ----------------------------------------- 2:58 AM on 06/25/2016 -----------------------------------------    Physical Exam Lower abdominal tenderness with mild right upper quadrant tenderness palpation with a negative Murphy sign. ED Course  Procedures  MDM CAT scans showing what appears to be acute cholecystitis. Antibiotics already written by Dr. Quentin Cornwall. Discussed case with Dr. Burt Knack who will evaluate the patient and likely admit. I also discussed the imaging results with the patient and his understanding of the plan for admission and willing to comply.       Orbie Pyo, MD 06/25/16 [phone]  Patient evaluated by Dr. Burt Knack because of the patient's medical complexity recommends admission to the internal medicine service. Signed out to Dr. Marcille Blanco.    Orbie Pyo, MD 06/25/16 [phone]

## 2016-06-25 NOTE — Progress Notes (Signed)
ANTIBIOTIC CONSULT NOTE - INITIAL  Pharmacy Consult for Meropenem  Indication: gall bladder infection  Allergies  Allergen Reactions  . Ativan [Lorazepam] Other (See Comments)    Hallucinations, combative  . Penicillin G Other (See Comments)    Has patient had a PCN reaction causing immediate rash, facial/tongue/throat swelling, SOB or lightheadedness with hypotension: Yes Has patient had a PCN reaction causing severe rash involving mucus membranes or skin necrosis: No Has patient had a PCN reaction that required hospitalization No Has patient had a PCN reaction occurring within the last 10 years: No If all of the above answers are "NO", then may proceed with Cephalosporin use.     Patient Measurements: Height: 5\' 8"  (172.7 cm) Weight: 167 lb (75.8 kg) IBW/kg (Calculated) : 68.4 Adjusted Body Weight:   Vital Signs: Temp: 98.4 F (36.9 C) (12/17 2229) Temp Source: Oral (12/17 2229) BP: 114/70 (12/18 0430) Pulse Rate: 73 (12/18 0430) Intake/Output from previous day: 12/17 0701 - 12/18 0700 In: 1050 [IV Piggyback:1050] Out: -  Intake/Output from this shift: Total I/O In: 1050 [IV Piggyback:1050] Out: -   Labs:  Recent Labs  06/25/16 0009  WBC 5.7  HGB 13.4  PLT 132*  CREATININE 3.10*   Estimated Creatinine Clearance: 16.2 mL/min (by C-G formula based on SCr of 3.1 mg/dL (H)). No results for input(s): VANCOTROUGH, VANCOPEAK, VANCORANDOM, GENTTROUGH, GENTPEAK, GENTRANDOM, TOBRATROUGH, TOBRAPEAK, TOBRARND, AMIKACINPEAK, AMIKACINTROU, AMIKACIN in the last 72 hours.   Microbiology: No results found for this or any previous visit (from the past 720 hour(s)).  Medical History: Past Medical History:  Diagnosis Date  . Cardiac disease   . CHF (congestive heart failure) ([place])   . Hypercholesteremia   . Hypertension   . Kidney failure    left  . Osteoporosis   . Prostate cancer ([place])   . Shingles   . Skin cancer    right cheek    Medications:  Prescriptions  Prior to Admission  Medication Sig Dispense Refill Last Dose  . albuterol (PROVENTIL HFA;VENTOLIN HFA) 108 (90 BASE) MCG/ACT inhaler Inhale 2 puffs into the lungs every 6 (six) hours as needed for wheezing or shortness of breath.   prn at prn  . aspirin EC 81 MG tablet Take 81 mg by mouth at bedtime.   06/24/2016 at Unknown time  . atorvastatin (LIPITOR) 40 MG tablet Take 40 mg by mouth at bedtime.    06/24/2016 at Unknown time  . Cholecalciferol (VITAMIN D3) 5000 units TABS Take 5,000 Units by mouth daily.   06/24/2016 at Unknown time  . doxazosin (CARDURA) 4 MG tablet Take 4 mg by mouth at bedtime.    06/24/2016 at Unknown time  . ferrous sulfate 325 (65 FE) MG tablet Take 325 mg by mouth 2 (two) times daily.    06/24/2016 at Unknown time  . hydrALAZINE (APRESOLINE) 25 MG tablet Take 1 tablet (25 mg total) by mouth every 8 (eight) hours. (Patient taking differently: Take 25 mg by mouth 2 (two) times daily. ) 90 tablet 6 06/24/2016 at 1900  . isosorbide mononitrate (IMDUR) 30 MG 24 hr tablet Take 1 tablet (30 mg total) by mouth daily. 30 tablet 6 06/24/2016 at Unknown time  . levothyroxine (SYNTHROID, LEVOTHROID) 100 MCG tablet Take 100 mcg by mouth daily.   06/24/2016 at Unknown time  . losartan (COZAAR) 100 MG tablet Take 100 mg by mouth daily. Reported on 11/24/2015   06/24/2016 at Unknown time  . metoprolol succinate (TOPROL-XL) 50 MG 24 hr tablet Take  50 mg by mouth daily.    06/24/2016 at 1200  . nitroGLYCERIN (NITROSTAT) 0.4 MG SL tablet Place 0.4 mg under the tongue every 5 (five) minutes as needed for chest pain.   prn at prn  . omega-3 acid ethyl esters (LOVAZA) 1 G capsule Take 1 capsule by mouth 2 (two) times daily.   06/24/2016 at Unknown time  . pantoprazole (PROTONIX) 40 MG tablet Take 40 mg by mouth 2 (two) times daily.    06/24/2016 at Unknown time  . potassium chloride SA (K-DUR,KLOR-CON) 20 MEQ tablet Take 20 mEq by mouth 2 (two) times daily.    06/24/2016 at Unknown time  .  sodium bicarbonate 650 MG tablet Take 1,300 mg by mouth 2 (two) times daily.    06/24/2016 at Unknown time  . tamsulosin (FLOMAX) 0.4 MG CAPS capsule Take 0.4 mg by mouth daily.   06/24/2016 at Unknown time  . torsemide (DEMADEX) 20 MG tablet Take 20 mg by mouth 2 (two) times daily.    06/24/2016 at Unknown time   Assessment: CrCl = 16.2 ml/min   Goal of Therapy:  resolution of infection  Plan:  Expected duration 5 days with resolution of temperature and/or normalization of WBC   Meropenem 500 mg IV Q12H.   [name] D 06/25/2016,6:20 AM

## 2016-06-25 NOTE — Progress Notes (Signed)
[place] at [facility]                                                                                                                                                                                  Patient Demographics   [name], is a 82 y.o. male, DOB - 31-Mar-1927, JYN:[id number]  Admit date - 06/24/2016   Admitting Physician Harrie Foreman, MD  Outpatient Primary MD for the patient is Lindley,Cheryl Paulette, FNP   LOS - 0  Subjective:  patient complains of abdominal pain, hr improved    Review of Systems:   CONSTITUTIONAL: No documented fever. No fatigue, weakness. No weight gain, no weight loss.  EYES: No blurry or double vision.  ENT: No tinnitus. No postnasal drip. No redness of the oropharynx.  RESPIRATORY: No cough, no wheeze, no hemoptysis. No dyspnea.  CARDIOVASCULAR: No chest pain. No orthopnea. No palpitations. No syncope.  GASTROINTESTINAL: No nausea, no vomiting or diarrhea. No abdominal pain. No melena or hematochezia.  GENITOURINARY: No dysuria or hematuria.  ENDOCRINE: No polyuria or nocturia. No heat or cold intolerance.  HEMATOLOGY: No anemia. No bruising. No bleeding.  INTEGUMENTARY: No rashes. No lesions.  MUSCULOSKELETAL: No arthritis. No swelling. No gout.  NEUROLOGIC: No numbness, tingling, or ataxia. No seizure-type activity.  PSYCHIATRIC: No anxiety. No insomnia. No ADD.    Vitals:   Vitals:   06/25/16 0430 06/25/16 0657 06/25/16 0856 06/25/16 1300  BP: 114/70 (!) 103/49 134/72 128/73  Pulse: 73 95 91 85  Resp: 20 18 20 19   Temp:  98.7 F (37.1 C)  98.1 F (36.7 C)  TempSrc:  Oral  Oral  SpO2: 100% 96% 98% 98%  Weight:      Height:        Wt Readings from Last 3 Encounters:  06/24/16 167 lb (75.8 kg)  05/23/16 162 lb 9.4 oz (73.8 kg)  11/24/15 170 lb 1.4 oz (77.2 kg)     Intake/Output Summary (Last 24 hours) at 06/25/16 1513 Last data filed at 06/25/16 1300  Gross per 24 hour  Intake              1050 ml  Output              350 ml  Net              700 ml    Physical Exam:   GENERAL: Pleasant-appearing in no apparent distress.  HEAD, EYES, EARS, NOSE AND THROAT: Atraumatic, normocephalic. Extraocular muscles are intact. Pupils equal and reactive to light. Sclerae anicteric. No conjunctival injection. No oro-pharyngeal erythema.  NECK: Supple. There is no jugular venous distention. No bruits,  no lymphadenopathy, no thyromegaly.  HEART: irregular, irrrgular . No murmurs, no rubs, no clicks.  LUNGS: Clear to auscultation bilaterally. No rales or rhonchi. No wheezes.  ABDOMEN: Bilateral lower abdominal tenderness no guarding no rebound  EXTREMITIES: No evidence of any cyanosis, clubbing, or peripheral edema.  +2 pedal and radial pulses bilaterally.  NEUROLOGIC: The patient is alert, awake, and oriented x3 with no focal motor or sensory deficits appreciated bilaterally.  SKIN: Moist and warm with no rashes appreciated.  Psych: Not anxious, depressed LN: No inguinal LN enlargement    Antibiotics   Anti-infectives    Start     Dose/Rate Route Frequency Ordered Stop   06/25/16 0630  meropenem (MERREM) IVPB SOLR 500 mg     500 mg 100 mL/hr over 30 Minutes Intravenous Every 12 hours 06/25/16 0620     06/25/16 0145  ciprofloxacin (CIPRO) IVPB 400 mg     400 mg 200 mL/hr over 60 Minutes Intravenous  Once 06/25/16 0134 06/25/16 0413   06/25/16 0145  metroNIDAZOLE (FLAGYL) IVPB 500 mg     500 mg 100 mL/hr over 60 Minutes Intravenous  Once 06/25/16 0134 06/25/16 0413      Medications   Scheduled Meds: . aspirin EC  81 mg Oral QHS  . atorvastatin  40 mg Oral QHS  . cholecalciferol  5,000 Units Oral Daily  . docusate sodium  100 mg Oral BID  . doxazosin  4 mg Oral QHS  . ferrous sulfate  325 mg Oral BID  . heparin  5,000 Units Subcutaneous Q8H  . hydrALAZINE  25 mg Oral BID  . isosorbide mononitrate  30 mg Oral Daily  . levothyroxine  100 mcg Oral Daily  . meropenem  500 mg  Intravenous Q12H  . metoprolol tartrate  50 mg Oral BID  . omega-3 acid ethyl esters  1 capsule Oral BID  . pantoprazole  40 mg Oral BID  . potassium chloride SA  20 mEq Oral BID  . sodium bicarbonate  1,300 mg Oral BID  . sodium chloride flush  3 mL Intravenous Q12H  . tamsulosin  0.4 mg Oral Daily  . torsemide  20 mg Oral BID   Continuous Infusions: . sodium chloride 100 mL/hr at 06/25/16 1212   PRN Meds:.acetaminophen **OR** acetaminophen, albuterol, metoprolol, morphine injection, nitroGLYCERIN   Data Review:   Micro Results Recent Results (from the past 240 hour(s))  Blood culture (routine x 2)     Status: None (Preliminary result)   Collection Time: 06/25/16  2:08 AM  Result Value Ref Range Status   Specimen Description BLOOD LEFT ANTECUBITAL  Final   Special Requests   Final    BOTTLES DRAWN AEROBIC AND ANAEROBIC 10CCAERO,10CCANA   Culture NO GROWTH < 12 HOURS  Final   Report Status PENDING  Incomplete  Blood culture (routine x 2)     Status: None (Preliminary result)   Collection Time: 06/25/16  2:09 AM  Result Value Ref Range Status   Specimen Description BLOOD LEFT WRIST  Final   Special Requests BOTTLES DRAWN AEROBIC AND ANAEROBIC 2CCAERO,3CCANA  Final   Culture NO GROWTH < 12 HOURS  Final   Report Status PENDING  Incomplete    Radiology Reports Ct Abdomen Pelvis Wo Contrast  Result Date: 06/25/2016 CLINICAL DATA:  82 year old male with sepsis and abdominal pain. History of prostate cancer. EXAM: CT CHEST, ABDOMEN AND PELVIS WITHOUT CONTRAST TECHNIQUE: Multidetector CT imaging of the chest, abdomen and pelvis was performed following the standard protocol  without IV contrast. COMPARISON:  None. FINDINGS: Evaluation of this exam is limited in the absence of intravenous contrast. CT CHEST FINDINGS Cardiovascular: There is no cardiomegaly or pericardial effusion. Multi vessel coronary vascular calcification involving the LAD, left circumflex, and RCA. There is  atherosclerotic calcification of the thoracic aorta. No aneurysmal dilatation. The central pulmonary arteries are grossly unremarkable on this noncontrast study. Mediastinum/Nodes: No hilar or mediastinal adenopathy. There is a moderate size hiatal hernia. The esophagus is grossly unremarkable. Subcentimeter right thyroid nodule. No mediastinal collection or hematoma. Lungs/Pleura: Bibasilar dependent atelectatic changes. Left lung base compressive atelectasis secondary to hiatal hernia. Pneumonia is less likely. There is no pleural effusion or pneumothorax. The central airways are patent. Musculoskeletal: No axillary adenopathy. The chest wall soft tissues appear unremarkable. There is osteopenia with degenerative changes of the spine. No acute fracture. CT ABDOMEN PELVIS FINDINGS No intra-abdominal free air. Small subhepatic and pericholecystic fluid. Hepatobiliary: The liver is grossly unremarkable. There multiple stones within the gallbladder fundus. A stone is noted at the gallbladder neck. There is diffuse gallbladder wall thickening and edema. A 5.6 x 6.3 cm fluid collection noted in the right upper abdomen inferior to the gallbladder. This collection does not demonstrate an organized wall or definite evidence of loculation. Pancreas: The pancreas is atrophic. No inflammatory changes of pancreas. Spleen: Normal in size without focal abnormality. Adrenals/Urinary Tract: The adrenal glands appear unremarkable. The left kidney is atrophic. There is a 15 mm stone in the left kidney. There are two adjacent stones in the distal left ureter measuring up to 6 mm. There is moderate left hydronephrosis. Punctate nonobstructing right renal upper pole calculus. There is no hydronephrosis on the right. The right ureter appears unremarkable. The urinary bladder is predominantly collapsed. There is apparent diffuse thickening of the bladder wall which may be partly related to underdistention. Cystitis is not excluded.  Correlation with urinalysis recommended. Stomach/Bowel: There is extensive sigmoid diverticulosis without active inflammatory changes. Moderate size hiatal hernia noted. There is no evidence of bowel obstruction for active inflammation. The appendix appears unremarkable. Vascular/Lymphatic: There is moderate aortoiliac atherosclerotic disease. No aneurysmal dilatation. No portal venous gas identified. There is no adenopathy. Reproductive: The prostate and seminal vesicles are grossly unremarkable. Other: Small fat containing left inguinal hernia. No fluid collection. Musculoskeletal: Osteopenia.  No acute fracture. IMPRESSION: Cholelithiasis with findings most compatible with acute cholecystitis. A 4 mm stone at the neck of the gallbladder is concerning for an obstructing gallstone. Correlation with clinical exam recommended. Ultrasound may provide additional information. Small amount of fluid inferior to the gallbladder does not demonstrate loculation or organized wall. Atrophic left kidney. Two adjacent distal left ureteral calculi measuring up to 6 mm with moderate left hydronephrosis. A 15 mm left renal calculus is also noted. Punctate nonobstructing right renal upper pole stone. No hydronephrosis. Thickened bladder wall. Correlation with urinalysis recommended to evaluate for cystitis. Extensive colonic diverticulosis. No evidence of bowel obstruction or active inflammation. She Electronically Signed   By: Anner Crete M.D.   On: 06/25/2016 02:39   Ct Chest Wo Contrast  Result Date: 06/25/2016 CLINICAL DATA:  82 year old male with sepsis and abdominal pain. History of prostate cancer. EXAM: CT CHEST, ABDOMEN AND PELVIS WITHOUT CONTRAST TECHNIQUE: Multidetector CT imaging of the chest, abdomen and pelvis was performed following the standard protocol without IV contrast. COMPARISON:  None. FINDINGS: Evaluation of this exam is limited in the absence of intravenous contrast. CT CHEST FINDINGS  Cardiovascular: There is no cardiomegaly or pericardial  effusion. Multi vessel coronary vascular calcification involving the LAD, left circumflex, and RCA. There is atherosclerotic calcification of the thoracic aorta. No aneurysmal dilatation. The central pulmonary arteries are grossly unremarkable on this noncontrast study. Mediastinum/Nodes: No hilar or mediastinal adenopathy. There is a moderate size hiatal hernia. The esophagus is grossly unremarkable. Subcentimeter right thyroid nodule. No mediastinal collection or hematoma. Lungs/Pleura: Bibasilar dependent atelectatic changes. Left lung base compressive atelectasis secondary to hiatal hernia. Pneumonia is less likely. There is no pleural effusion or pneumothorax. The central airways are patent. Musculoskeletal: No axillary adenopathy. The chest wall soft tissues appear unremarkable. There is osteopenia with degenerative changes of the spine. No acute fracture. CT ABDOMEN PELVIS FINDINGS No intra-abdominal free air. Small subhepatic and pericholecystic fluid. Hepatobiliary: The liver is grossly unremarkable. There multiple stones within the gallbladder fundus. A stone is noted at the gallbladder neck. There is diffuse gallbladder wall thickening and edema. A 5.6 x 6.3 cm fluid collection noted in the right upper abdomen inferior to the gallbladder. This collection does not demonstrate an organized wall or definite evidence of loculation. Pancreas: The pancreas is atrophic. No inflammatory changes of pancreas. Spleen: Normal in size without focal abnormality. Adrenals/Urinary Tract: The adrenal glands appear unremarkable. The left kidney is atrophic. There is a 15 mm stone in the left kidney. There are two adjacent stones in the distal left ureter measuring up to 6 mm. There is moderate left hydronephrosis. Punctate nonobstructing right renal upper pole calculus. There is no hydronephrosis on the right. The right ureter appears unremarkable. The urinary bladder  is predominantly collapsed. There is apparent diffuse thickening of the bladder wall which may be partly related to underdistention. Cystitis is not excluded. Correlation with urinalysis recommended. Stomach/Bowel: There is extensive sigmoid diverticulosis without active inflammatory changes. Moderate size hiatal hernia noted. There is no evidence of bowel obstruction for active inflammation. The appendix appears unremarkable. Vascular/Lymphatic: There is moderate aortoiliac atherosclerotic disease. No aneurysmal dilatation. No portal venous gas identified. There is no adenopathy. Reproductive: The prostate and seminal vesicles are grossly unremarkable. Other: Small fat containing left inguinal hernia. No fluid collection. Musculoskeletal: Osteopenia.  No acute fracture. IMPRESSION: Cholelithiasis with findings most compatible with acute cholecystitis. A 4 mm stone at the neck of the gallbladder is concerning for an obstructing gallstone. Correlation with clinical exam recommended. Ultrasound may provide additional information. Small amount of fluid inferior to the gallbladder does not demonstrate loculation or organized wall. Atrophic left kidney. Two adjacent distal left ureteral calculi measuring up to 6 mm with moderate left hydronephrosis. A 15 mm left renal calculus is also noted. Punctate nonobstructing right renal upper pole stone. No hydronephrosis. Thickened bladder wall. Correlation with urinalysis recommended to evaluate for cystitis. Extensive colonic diverticulosis. No evidence of bowel obstruction or active inflammation. She Electronically Signed   By: Anner Crete M.D.   On: 06/25/2016 02:39   Nm Hepatobiliary Liver Func  Result Date: 06/25/2016 CLINICAL DATA:  Abdominal pain 3 days. Cholelithiasis and acute cholecystitis. EXAM: NUCLEAR MEDICINE HEPATOBILIARY IMAGING TECHNIQUE: Sequential images of the abdomen were obtained out to 60 minutes following intravenous administration of  radiopharmaceutical. RADIOPHARMACEUTICALS:  4.9 mCi Tc-73m  Choletec IV COMPARISON:  CT on 06/25/2016 FINDINGS: Prompt uptake and biliary excretion of activity by the liver is seen. Gallbladder activity is visualized, consistent with patency of cystic duct. Biliary activity passes into small bowel, consistent with patent common bile duct. IMPRESSION: Patency of both cystic and common bile ducts demonstrated (despite findings of acute cholecystitis on  recent CT). See previous CT report. Electronically Signed   By: Earle Gell M.D.   On: 06/25/2016 15:00   Dg Chest Portable 1 View  Result Date: 06/25/2016 CLINICAL DATA:  82 year old male with acute respiratory distress. EXAM: PORTABLE CHEST 1 VIEW COMPARISON:  Chest radiograph dated 12/01/2010 and abdominal CT dated 07/12/2015 FINDINGS: Mild diffuse interstitial coarsening. No focal consolidation, pleural effusion, or pneumothorax. Moderate size hiatal hernia. Stable cardiac silhouette. No acute osseous pathology. IMPRESSION: No acute cardiopulmonary process. Moderate hiatal hernia. Electronically Signed   By: Anner Crete M.D.   On: 06/25/2016 00:50     CBC  Recent Labs Lab 06/25/16 0009  WBC 5.7  HGB 13.4  HCT 38.8*  PLT 132*  MCV 88.7  MCH 30.7  MCHC 34.6  RDW 14.8*  LYMPHSABS 0.2*  MONOABS 0.5  EOSABS 0.0  BASOSABS 0.0    Chemistries   Recent Labs Lab 06/25/16 0009  NA 133*  K 3.9  CL 103  CO2 20*  GLUCOSE 101*  BUN 35*  CREATININE 3.10*  CALCIUM 8.8*  AST 26  ALT 15*  ALKPHOS 42  BILITOT 1.5*   ------------------------------------------------------------------------------------------------------------------ estimated creatinine clearance is 16.2 mL/min (by C-G formula based on SCr of 3.1 mg/dL (H)). ------------------------------------------------------------------------------------------------------------------ No results for input(s): HGBA1C in the last 72  hours. ------------------------------------------------------------------------------------------------------------------ No results for input(s): CHOL, HDL, LDLCALC, TRIG, CHOLHDL, LDLDIRECT in the last 72 hours. ------------------------------------------------------------------------------------------------------------------  Recent Labs  06/25/16 0009  TSH 1.438   ------------------------------------------------------------------------------------------------------------------ No results for input(s): VITAMINB12, FOLATE, FERRITIN, TIBC, IRON, RETICCTPCT in the last 72 hours.  Coagulation profile  Recent Labs Lab 06/25/16 0009  INR 1.12    No results for input(s): DDIMER in the last 72 hours.  Cardiac Enzymes  Recent Labs Lab 06/25/16 0009  TROPONINI 0.04*   ------------------------------------------------------------------------------------------------------------------ Invalid input(s): POCBNP    Assessment & Plan   This is an 82 year old male admitted for sepsis secondary to cholecystitis. 1. Sepsis: Secondary to cholecystitis;  Await HIDA scan may need cholecystectomy tube or surgical intervention surgery following. 2. A. fib with RVR heart rate now much better. Continue current regimen seen by cardiology await echocardiogram of the heart  3. Chronic kidney disease: Stage IV; avoid nephrotoxic agents. Hydrate with intravenous fluid. 4. CHF: Systolic; chronic, stable. hold  torsemide and  continue metoprolol per home regimen 5. Hypertension: Controlled; continue hydralazine and losartan 6. Elevated troponin: Continue to follow cardiac enzymes. Likely secondary to sepsis causing demand ischemia and/or poor renal clearance 7. Hypothyroidism: Continue Synthroid 8. Prostate cancer: Recent Lupron injection. Continue tamsulosin and Cardura for urinary retention 9. Hyperlipidemia: Continue statin therapy 10. DVT prophylaxis: Heparin 11. GI prophylaxis: Pantoprazole per  home regimen     Code Status Orders        Start     Ordered   06/25/16 0616  Full code  Continuous     06/25/16 0615    Code Status History    Date Active Date Inactive Code Status Order ID Comments User Context   07/20/2015 12:00 PM 07/23/2015  3:48 PM Full Code [id number]  Epifanio Lesches, MD ED   06/03/2015  3:17 PM 06/07/2015 12:18 PM Full Code [id number]  Bettey Costa, MD Inpatient           Consults  Cardiology, surgery   DVT Prophylaxis  Lovenox    Lab Results  Component Value Date   PLT 132 (L) 06/25/2016     Time Spent in minutes   41min Greater than 50% of time  spent in care coordination and counseling patient regarding the condition and plan of care.   [name] M.D on 06/25/2016 at 3:13 PM  Between 7am to 6pm - Pager - [phone]  After 6pm go to [email or web address] - password [email or web address] [facility]   Office  [phone]

## 2016-06-25 NOTE — Consult Note (Signed)
Surgical Consultation  06/25/2016  [name] is an 82 y.o. male.   CC: Acute cholecystitis  HPI: This patient with likely acute cholecystitis. He's had 3-4 days of abdominal pain and he points to the right upper quadrant and right lower quadrant. He's had no nausea or vomiting and no fevers or chills but states he is tender in the right side. He's had normal bowel movements.  Of note at the time of the exam the patient's heart rate is 145 in atrial fibrillation.  Patient has a history of stents atrial fibrillation and prostate cancer for which he had radiation.  Past Medical History:  Diagnosis Date  . Cardiac disease   . CHF (congestive heart failure) ([place])   . Hypercholesteremia   . Hypertension   . Kidney failure    left  . Osteoporosis   . Prostate cancer ([place])   . Shingles   . Skin cancer    right cheek    Past Surgical History:  Procedure Laterality Date  . CARDIAC CATHETERIZATION  04/13/2014    No family history on file.  Social History:  reports that he has quit smoking. His smoking use included Cigarettes and Cigars. His smokeless tobacco use includes Chew. He reports that he does not drink alcohol. His drug history is not on file.  Allergies:  Allergies  Allergen Reactions  . Ativan [Lorazepam] Other (See Comments)    Hallucinations, combative  . Penicillin G Other (See Comments)    Has patient had a PCN reaction causing immediate rash, facial/tongue/throat swelling, SOB or lightheadedness with hypotension: Yes Has patient had a PCN reaction causing severe rash involving mucus membranes or skin necrosis: No Has patient had a PCN reaction that required hospitalization No Has patient had a PCN reaction occurring within the last 10 years: No If all of the above answers are "NO", then may proceed with Cephalosporin use.     Medications reviewed.   Review of Systems:   Review of Systems  Constitutional: Positive for malaise/fatigue. Negative for  chills and fever.  HENT: Negative.   Eyes: Negative.   Respiratory: Negative.   Cardiovascular: Negative for chest pain and palpitations.  Gastrointestinal: Positive for abdominal pain. Negative for blood in stool, constipation, diarrhea, heartburn, nausea and vomiting.  Genitourinary: Negative.   Musculoskeletal: Negative.   Skin: Negative.   Neurological: Negative.   Endo/Heme/Allergies: Negative.   Psychiatric/Behavioral: Negative.      Physical Exam:  BP (!) 101/59   Pulse (!) 139   Temp 98.4 F (36.9 C) (Oral)   Resp (!) 30   Ht '5\' 8"'  (1.727 m)   Wt 167 lb (75.8 kg)   SpO2 97%   BMI 25.39 kg/m   Physical Exam  Constitutional: He is oriented to person, place, and time and well-developed, well-nourished, and in no distress. No distress.  HENT:  Head: Normocephalic and atraumatic.  Left posterior neck mass (patient states when they're 22 years and his physicians have known about it for 20 years)  Eyes: Pupils are equal, round, and reactive to light. Right eye exhibits no discharge. Left eye exhibits no discharge. No scleral icterus.  Neck: Normal range of motion.  Cardiovascular: Normal heart sounds.   Atrial fibrillation with a heart rate of 145  Pulmonary/Chest: Effort normal. No respiratory distress. He has wheezes. He has no rales.  Abdominal: Soft. He exhibits distension. There is tenderness. There is no rebound and no guarding.  Positive Murphy sign maximal tenderness in the right upper quadrant  Musculoskeletal: Normal range of motion. He exhibits edema. He exhibits no tenderness.  Lymphadenopathy:    He has no cervical adenopathy.  Neurological: He is alert and oriented to person, place, and time.  Skin: Skin is warm and dry. No rash noted. He is not diaphoretic. No erythema.  Psychiatric: Mood and affect normal.  Vitals reviewed.     Results for orders placed or performed during the hospital encounter of 06/24/16 (from the past 48 hour(s))  CBC with  Differential     Status: Abnormal   Collection Time: 06/25/16 12:09 AM  Result Value Ref Range   WBC 5.7 3.8 - 10.6 K/uL   RBC 4.37 (L) 4.40 - 5.90 MIL/uL   Hemoglobin 13.4 13.0 - 18.0 g/dL   HCT 38.8 (L) 40.0 - 52.0 %   MCV 88.7 80.0 - 100.0 fL   MCH 30.7 26.0 - 34.0 pg   MCHC 34.6 32.0 - 36.0 g/dL   RDW 14.8 (H) 11.5 - 14.5 %   Platelets 132 (L) 150 - 440 K/uL   Neutrophils Relative % 87 %   Neutro Abs 5.0 1.4 - 6.5 K/uL   Lymphocytes Relative 4 %   Lymphs Abs 0.2 (L) 1.0 - 3.6 K/uL   Monocytes Relative 9 %   Monocytes Absolute 0.5 0.2 - 1.0 K/uL   Eosinophils Relative 0 %   Eosinophils Absolute 0.0 0 - 0.7 K/uL   Basophils Relative 0 %   Basophils Absolute 0.0 0 - 0.1 K/uL  Comprehensive metabolic panel     Status: Abnormal   Collection Time: 06/25/16 12:09 AM  Result Value Ref Range   Sodium 133 (L) 135 - 145 mmol/L   Potassium 3.9 3.5 - 5.1 mmol/L   Chloride 103 101 - 111 mmol/L   CO2 20 (L) 22 - 32 mmol/L   Glucose, Bld 101 (H) 65 - 99 mg/dL   BUN 35 (H) 6 - 20 mg/dL   Creatinine, Ser 3.10 (H) 0.61 - 1.24 mg/dL   Calcium 8.8 (L) 8.9 - 10.3 mg/dL   Total Protein 6.3 (L) 6.5 - 8.1 g/dL   Albumin 3.3 (L) 3.5 - 5.0 g/dL   AST 26 15 - 41 U/L   ALT 15 (L) 17 - 63 U/L   Alkaline Phosphatase 42 38 - 126 U/L   Total Bilirubin 1.5 (H) 0.3 - 1.2 mg/dL   GFR calc non Af Amer 17 (L) >60 mL/min   GFR calc Af Amer 19 (L) >60 mL/min    Comment: (NOTE) The eGFR has been calculated using the CKD EPI equation. This calculation has not been validated in all clinical situations. eGFR's persistently <60 mL/min signify possible Chronic Kidney Disease.    Anion gap 10 5 - 15  Lactic acid, plasma     Status: Abnormal   Collection Time: 06/25/16 12:09 AM  Result Value Ref Range   Lactic Acid, Venous 2.4 (HH) 0.5 - 1.9 mmol/L    Comment: CRITICAL RESULT CALLED TO, READ BACK BY AND VERIFIED WITH RACHEL HAYDEN AT 0054 06/25/16.PMH  Type and screen [place]      Status: None   Collection Time: 06/25/16 12:09 AM  Result Value Ref Range   ABO/RH(D) AB POS    Antibody Screen NEG    Sample Expiration 06/28/2016   Protime-INR     Status: None   Collection Time: 06/25/16 12:09 AM  Result Value Ref Range   Prothrombin Time 14.5 11.4 - 15.2 seconds   INR 1.12  Troponin I     Status: Abnormal   Collection Time: 06/25/16 12:09 AM  Result Value Ref Range   Troponin I 0.04 (HH) <0.03 ng/mL    Comment: CRITICAL RESULT CALLED TO, READ BACK BY AND VERIFIED WITH RACHEL HAYDEN AT 0054 06/25/16.PMH  Urinalysis, Complete w Microscopic     Status: Abnormal   Collection Time: 06/25/16  2:08 AM  Result Value Ref Range   Color, Urine YELLOW (A) YELLOW   APPearance CLOUDY (A) CLEAR   Specific Gravity, Urine 1.013 1.005 - 1.030   pH 5.0 5.0 - 8.0   Glucose, UA 50 (A) NEGATIVE mg/dL   Hgb urine dipstick SMALL (A) NEGATIVE   Bilirubin Urine NEGATIVE NEGATIVE   Ketones, ur NEGATIVE NEGATIVE mg/dL   Protein, ur 30 (A) NEGATIVE mg/dL   Nitrite NEGATIVE NEGATIVE   Leukocytes, UA NEGATIVE NEGATIVE   RBC / HPF 0-5 0 - 5 RBC/hpf   WBC, UA 0-5 0 - 5 WBC/hpf   Bacteria, UA RARE (A) NONE SEEN   Squamous Epithelial / LPF 0-5 (A) NONE SEEN   Mucous PRESENT    Hyaline Casts, UA PRESENT    Granular Casts, UA PRESENT    Amorphous Crystal PRESENT   Influenza panel by PCR (type A & B, H1N1)     Status: None   Collection Time: 06/25/16  2:09 AM  Result Value Ref Range   Influenza A By PCR NEGATIVE NEGATIVE   Influenza B By PCR NEGATIVE NEGATIVE    Comment: (NOTE) The Xpert Xpress Flu assay is intended as an aid in the diagnosis of  influenza and should not be used as a sole basis for treatment.  This  assay is FDA approved for nasopharyngeal swab specimens only. Nasal  washings and aspirates are unacceptable for Xpert Xpress Flu testing.    Ct Abdomen Pelvis Wo Contrast  Result Date: 06/25/2016 CLINICAL DATA:  82 year old male with sepsis and abdominal pain.  History of prostate cancer. EXAM: CT CHEST, ABDOMEN AND PELVIS WITHOUT CONTRAST TECHNIQUE: Multidetector CT imaging of the chest, abdomen and pelvis was performed following the standard protocol without IV contrast. COMPARISON:  None. FINDINGS: Evaluation of this exam is limited in the absence of intravenous contrast. CT CHEST FINDINGS Cardiovascular: There is no cardiomegaly or pericardial effusion. Multi vessel coronary vascular calcification involving the LAD, left circumflex, and RCA. There is atherosclerotic calcification of the thoracic aorta. No aneurysmal dilatation. The central pulmonary arteries are grossly unremarkable on this noncontrast study. Mediastinum/Nodes: No hilar or mediastinal adenopathy. There is a moderate size hiatal hernia. The esophagus is grossly unremarkable. Subcentimeter right thyroid nodule. No mediastinal collection or hematoma. Lungs/Pleura: Bibasilar dependent atelectatic changes. Left lung base compressive atelectasis secondary to hiatal hernia. Pneumonia is less likely. There is no pleural effusion or pneumothorax. The central airways are patent. Musculoskeletal: No axillary adenopathy. The chest wall soft tissues appear unremarkable. There is osteopenia with degenerative changes of the spine. No acute fracture. CT ABDOMEN PELVIS FINDINGS No intra-abdominal free air. Small subhepatic and pericholecystic fluid. Hepatobiliary: The liver is grossly unremarkable. There multiple stones within the gallbladder fundus. A stone is noted at the gallbladder neck. There is diffuse gallbladder wall thickening and edema. A 5.6 x 6.3 cm fluid collection noted in the right upper abdomen inferior to the gallbladder. This collection does not demonstrate an organized wall or definite evidence of loculation. Pancreas: The pancreas is atrophic. No inflammatory changes of pancreas. Spleen: Normal in size without focal abnormality. Adrenals/Urinary Tract: The adrenal glands  appear unremarkable. The  left kidney is atrophic. There is a 15 mm stone in the left kidney. There are two adjacent stones in the distal left ureter measuring up to 6 mm. There is moderate left hydronephrosis. Punctate nonobstructing right renal upper pole calculus. There is no hydronephrosis on the right. The right ureter appears unremarkable. The urinary bladder is predominantly collapsed. There is apparent diffuse thickening of the bladder wall which may be partly related to underdistention. Cystitis is not excluded. Correlation with urinalysis recommended. Stomach/Bowel: There is extensive sigmoid diverticulosis without active inflammatory changes. Moderate size hiatal hernia noted. There is no evidence of bowel obstruction for active inflammation. The appendix appears unremarkable. Vascular/Lymphatic: There is moderate aortoiliac atherosclerotic disease. No aneurysmal dilatation. No portal venous gas identified. There is no adenopathy. Reproductive: The prostate and seminal vesicles are grossly unremarkable. Other: Small fat containing left inguinal hernia. No fluid collection. Musculoskeletal: Osteopenia.  No acute fracture. IMPRESSION: Cholelithiasis with findings most compatible with acute cholecystitis. A 4 mm stone at the neck of the gallbladder is concerning for an obstructing gallstone. Correlation with clinical exam recommended. Ultrasound may provide additional information. Small amount of fluid inferior to the gallbladder does not demonstrate loculation or organized wall. Atrophic left kidney. Two adjacent distal left ureteral calculi measuring up to 6 mm with moderate left hydronephrosis. A 15 mm left renal calculus is also noted. Punctate nonobstructing right renal upper pole stone. No hydronephrosis. Thickened bladder wall. Correlation with urinalysis recommended to evaluate for cystitis. Extensive colonic diverticulosis. No evidence of bowel obstruction or active inflammation. She Electronically Signed   By: Anner Crete M.D.   On: 06/25/2016 02:39   Ct Chest Wo Contrast  Result Date: 06/25/2016 CLINICAL DATA:  82 year old male with sepsis and abdominal pain. History of prostate cancer. EXAM: CT CHEST, ABDOMEN AND PELVIS WITHOUT CONTRAST TECHNIQUE: Multidetector CT imaging of the chest, abdomen and pelvis was performed following the standard protocol without IV contrast. COMPARISON:  None. FINDINGS: Evaluation of this exam is limited in the absence of intravenous contrast. CT CHEST FINDINGS Cardiovascular: There is no cardiomegaly or pericardial effusion. Multi vessel coronary vascular calcification involving the LAD, left circumflex, and RCA. There is atherosclerotic calcification of the thoracic aorta. No aneurysmal dilatation. The central pulmonary arteries are grossly unremarkable on this noncontrast study. Mediastinum/Nodes: No hilar or mediastinal adenopathy. There is a moderate size hiatal hernia. The esophagus is grossly unremarkable. Subcentimeter right thyroid nodule. No mediastinal collection or hematoma. Lungs/Pleura: Bibasilar dependent atelectatic changes. Left lung base compressive atelectasis secondary to hiatal hernia. Pneumonia is less likely. There is no pleural effusion or pneumothorax. The central airways are patent. Musculoskeletal: No axillary adenopathy. The chest wall soft tissues appear unremarkable. There is osteopenia with degenerative changes of the spine. No acute fracture. CT ABDOMEN PELVIS FINDINGS No intra-abdominal free air. Small subhepatic and pericholecystic fluid. Hepatobiliary: The liver is grossly unremarkable. There multiple stones within the gallbladder fundus. A stone is noted at the gallbladder neck. There is diffuse gallbladder wall thickening and edema. A 5.6 x 6.3 cm fluid collection noted in the right upper abdomen inferior to the gallbladder. This collection does not demonstrate an organized wall or definite evidence of loculation. Pancreas: The pancreas is atrophic. No  inflammatory changes of pancreas. Spleen: Normal in size without focal abnormality. Adrenals/Urinary Tract: The adrenal glands appear unremarkable. The left kidney is atrophic. There is a 15 mm stone in the left kidney. There are two adjacent stones in the distal left ureter measuring up  to 6 mm. There is moderate left hydronephrosis. Punctate nonobstructing right renal upper pole calculus. There is no hydronephrosis on the right. The right ureter appears unremarkable. The urinary bladder is predominantly collapsed. There is apparent diffuse thickening of the bladder wall which may be partly related to underdistention. Cystitis is not excluded. Correlation with urinalysis recommended. Stomach/Bowel: There is extensive sigmoid diverticulosis without active inflammatory changes. Moderate size hiatal hernia noted. There is no evidence of bowel obstruction for active inflammation. The appendix appears unremarkable. Vascular/Lymphatic: There is moderate aortoiliac atherosclerotic disease. No aneurysmal dilatation. No portal venous gas identified. There is no adenopathy. Reproductive: The prostate and seminal vesicles are grossly unremarkable. Other: Small fat containing left inguinal hernia. No fluid collection. Musculoskeletal: Osteopenia.  No acute fracture. IMPRESSION: Cholelithiasis with findings most compatible with acute cholecystitis. A 4 mm stone at the neck of the gallbladder is concerning for an obstructing gallstone. Correlation with clinical exam recommended. Ultrasound may provide additional information. Small amount of fluid inferior to the gallbladder does not demonstrate loculation or organized wall. Atrophic left kidney. Two adjacent distal left ureteral calculi measuring up to 6 mm with moderate left hydronephrosis. A 15 mm left renal calculus is also noted. Punctate nonobstructing right renal upper pole stone. No hydronephrosis. Thickened bladder wall. Correlation with urinalysis recommended to  evaluate for cystitis. Extensive colonic diverticulosis. No evidence of bowel obstruction or active inflammation. She Electronically Signed   By: Anner Crete M.D.   On: 06/25/2016 02:39   Dg Chest Portable 1 View  Result Date: 06/25/2016 CLINICAL DATA:  82 year old male with acute respiratory distress. EXAM: PORTABLE CHEST 1 VIEW COMPARISON:  Chest radiograph dated 12/01/2010 and abdominal CT dated 07/12/2015 FINDINGS: Mild diffuse interstitial coarsening. No focal consolidation, pleural effusion, or pneumothorax. Moderate size hiatal hernia. Stable cardiac silhouette. No acute osseous pathology. IMPRESSION: No acute cardiopulmonary process. Moderate hiatal hernia. Electronically Signed   By: Anner Crete M.D.   On: 06/25/2016 00:50    Assessment/Plan:  CT scan is reviewed as are his labs. While the patient may have acute cholecystitis he is certainly not a surgical candidate at this point because of his rapid heart rate of 145 in atrial fibrillation and needs to be controlled he also has an elevated creatinine and multiple other medical problems of preclude and limit the ability to proceed with laparoscopic cholecystectomy. I believe this patient would benefit from stabilization and improvement in his overall status prior to any surgical intervention. A HIDA scan will be ordered for today and if that is positive then percutaneous cholecystostomy tube may be of benefit unless he improved so much that he could have surgery in the next 24 hours. This is reviewed with his family and with the emergency room physician as well as prime doc.  Florene Glen, MD, FACS

## 2016-06-25 NOTE — H&P (Signed)
[name] is an 82 y.o. male.   Chief Complaint: Abdominal pain HPI: The patient with past medical history of chronic kidney disease and prostate cancer presents to the emergency department complaining of abdominal pain and shortness of breath. The patient states that his abdomen began to hurt 3 days ago. He denies vomiting but admits to some nausea at the onset of pain. He has been able to eat small amounts but admits to decreased appetite. He mentioned that his chest hurt incidentally at some time over the last few days but his chest was pain-free at the time of admission. In the emergency department the patient was found to have cholecystitis on CT of the abdomen. The patient was also found to meet criteria for sepsis. Surgery was consulted and recommended potential percutaneous drain placement rather than cholecystectomy which prompted the emergency department staff to call the hospitalist service for admission.  Past Medical History:  Diagnosis Date  . Cardiac disease   . CHF (congestive heart failure) ([place])   . Hypercholesteremia   . Hypertension   . Kidney failure    left  . Osteoporosis   . Prostate cancer ([place])   . Shingles   . Skin cancer    right cheek    Past Surgical History:  Procedure Laterality Date  . CARDIAC CATHETERIZATION  04/13/2014    Family History  Problem Relation Age of Onset  . CAD Mother    Social History:  reports that he has quit smoking. His smoking use included Cigarettes and Cigars. His smokeless tobacco use includes Chew. He reports that he does not drink alcohol. His drug history is not on file.  Allergies:  Allergies  Allergen Reactions  . Ativan [Lorazepam] Other (See Comments)    Hallucinations, combative  . Penicillin G Other (See Comments)    Has patient had a PCN reaction causing immediate rash, facial/tongue/throat swelling, SOB or lightheadedness with hypotension: Yes Has patient had a PCN reaction causing severe rash involving mucus  membranes or skin necrosis: No Has patient had a PCN reaction that required hospitalization No Has patient had a PCN reaction occurring within the last 10 years: No If all of the above answers are "NO", then may proceed with Cephalosporin use.     Medications Prior to Admission  Medication Sig Dispense Refill  . albuterol (PROVENTIL HFA;VENTOLIN HFA) 108 (90 BASE) MCG/ACT inhaler Inhale 2 puffs into the lungs every 6 (six) hours as needed for wheezing or shortness of breath.    Marland Kitchen aspirin EC 81 MG tablet Take 81 mg by mouth at bedtime.    Marland Kitchen atorvastatin (LIPITOR) 40 MG tablet Take 40 mg by mouth at bedtime.     . Cholecalciferol (VITAMIN D3) 5000 units TABS Take 5,000 Units by mouth daily.    Marland Kitchen doxazosin (CARDURA) 4 MG tablet Take 4 mg by mouth at bedtime.     . ferrous sulfate 325 (65 FE) MG tablet Take 325 mg by mouth 2 (two) times daily.     . hydrALAZINE (APRESOLINE) 25 MG tablet Take 1 tablet (25 mg total) by mouth every 8 (eight) hours. (Patient taking differently: Take 25 mg by mouth 2 (two) times daily. ) 90 tablet 6  . isosorbide mononitrate (IMDUR) 30 MG 24 hr tablet Take 1 tablet (30 mg total) by mouth daily. 30 tablet 6  . levothyroxine (SYNTHROID, LEVOTHROID) 100 MCG tablet Take 100 mcg by mouth daily.    Marland Kitchen losartan (COZAAR) 100 MG tablet Take 100 mg by  mouth daily. Reported on 11/24/2015    . metoprolol succinate (TOPROL-XL) 50 MG 24 hr tablet Take 50 mg by mouth daily.     . nitroGLYCERIN (NITROSTAT) 0.4 MG SL tablet Place 0.4 mg under the tongue every 5 (five) minutes as needed for chest pain.    Marland Kitchen omega-3 acid ethyl esters (LOVAZA) 1 G capsule Take 1 capsule by mouth 2 (two) times daily.    . pantoprazole (PROTONIX) 40 MG tablet Take 40 mg by mouth 2 (two) times daily.     . potassium chloride SA (K-DUR,KLOR-CON) 20 MEQ tablet Take 20 mEq by mouth 2 (two) times daily.     . sodium bicarbonate 650 MG tablet Take 1,300 mg by mouth 2 (two) times daily.     . tamsulosin (FLOMAX)  0.4 MG CAPS capsule Take 0.4 mg by mouth daily.    Marland Kitchen torsemide (DEMADEX) 20 MG tablet Take 20 mg by mouth 2 (two) times daily.       Results for orders placed or performed during the hospital encounter of 06/24/16 (from the past 48 hour(s))  CBC with Differential     Status: Abnormal   Collection Time: 06/25/16 12:09 AM  Result Value Ref Range   WBC 5.7 3.8 - 10.6 K/uL   RBC 4.37 (L) 4.40 - 5.90 MIL/uL   Hemoglobin 13.4 13.0 - 18.0 g/dL   HCT 38.8 (L) 40.0 - 52.0 %   MCV 88.7 80.0 - 100.0 fL   MCH 30.7 26.0 - 34.0 pg   MCHC 34.6 32.0 - 36.0 g/dL   RDW 14.8 (H) 11.5 - 14.5 %   Platelets 132 (L) 150 - 440 K/uL   Neutrophils Relative % 87 %   Neutro Abs 5.0 1.4 - 6.5 K/uL   Lymphocytes Relative 4 %   Lymphs Abs 0.2 (L) 1.0 - 3.6 K/uL   Monocytes Relative 9 %   Monocytes Absolute 0.5 0.2 - 1.0 K/uL   Eosinophils Relative 0 %   Eosinophils Absolute 0.0 0 - 0.7 K/uL   Basophils Relative 0 %   Basophils Absolute 0.0 0 - 0.1 K/uL  Comprehensive metabolic panel     Status: Abnormal   Collection Time: 06/25/16 12:09 AM  Result Value Ref Range   Sodium 133 (L) 135 - 145 mmol/L   Potassium 3.9 3.5 - 5.1 mmol/L   Chloride 103 101 - 111 mmol/L   CO2 20 (L) 22 - 32 mmol/L   Glucose, Bld 101 (H) 65 - 99 mg/dL   BUN 35 (H) 6 - 20 mg/dL   Creatinine, Ser 3.10 (H) 0.61 - 1.24 mg/dL   Calcium 8.8 (L) 8.9 - 10.3 mg/dL   Total Protein 6.3 (L) 6.5 - 8.1 g/dL   Albumin 3.3 (L) 3.5 - 5.0 g/dL   AST 26 15 - 41 U/L   ALT 15 (L) 17 - 63 U/L   Alkaline Phosphatase 42 38 - 126 U/L   Total Bilirubin 1.5 (H) 0.3 - 1.2 mg/dL   GFR calc non Af Amer 17 (L) >60 mL/min   GFR calc Af Amer 19 (L) >60 mL/min    Comment: (NOTE) The eGFR has been calculated using the CKD EPI equation. This calculation has not been validated in all clinical situations. eGFR's persistently <60 mL/min signify possible Chronic Kidney Disease.    Anion gap 10 5 - 15  Lactic acid, plasma     Status: Abnormal   Collection Time:  06/25/16 12:09 AM  Result Value Ref Range  Lactic Acid, Venous 2.4 (HH) 0.5 - 1.9 mmol/L    Comment: CRITICAL RESULT CALLED TO, READ BACK BY AND VERIFIED WITH RACHEL HAYDEN AT 0054 06/25/16.PMH  Type and screen [place]     Status: None   Collection Time: 06/25/16 12:09 AM  Result Value Ref Range   ABO/RH(D) AB POS    Antibody Screen NEG    Sample Expiration 06/28/2016   Protime-INR     Status: None   Collection Time: 06/25/16 12:09 AM  Result Value Ref Range   Prothrombin Time 14.5 11.4 - 15.2 seconds   INR 1.12   Troponin I     Status: Abnormal   Collection Time: 06/25/16 12:09 AM  Result Value Ref Range   Troponin I 0.04 (HH) <0.03 ng/mL    Comment: CRITICAL RESULT CALLED TO, READ BACK BY AND VERIFIED WITH RACHEL HAYDEN AT 0054 06/25/16.PMH  TSH     Status: None   Collection Time: 06/25/16 12:09 AM  Result Value Ref Range   TSH 1.438 0.350 - 4.500 uIU/mL    Comment: Performed by a 3rd Generation assay with a functional sensitivity of <=0.01 uIU/mL.  Urinalysis, Complete w Microscopic     Status: Abnormal   Collection Time: 06/25/16  2:08 AM  Result Value Ref Range   Color, Urine YELLOW (A) YELLOW   APPearance CLOUDY (A) CLEAR   Specific Gravity, Urine 1.013 1.005 - 1.030   pH 5.0 5.0 - 8.0   Glucose, UA 50 (A) NEGATIVE mg/dL   Hgb urine dipstick SMALL (A) NEGATIVE   Bilirubin Urine NEGATIVE NEGATIVE   Ketones, ur NEGATIVE NEGATIVE mg/dL   Protein, ur 30 (A) NEGATIVE mg/dL   Nitrite NEGATIVE NEGATIVE   Leukocytes, UA NEGATIVE NEGATIVE   RBC / HPF 0-5 0 - 5 RBC/hpf   WBC, UA 0-5 0 - 5 WBC/hpf   Bacteria, UA RARE (A) NONE SEEN   Squamous Epithelial / LPF 0-5 (A) NONE SEEN   Mucous PRESENT    Hyaline Casts, UA PRESENT    Granular Casts, UA PRESENT    Amorphous Crystal PRESENT   Blood culture (routine x 2)     Status: None (Preliminary result)   Collection Time: 06/25/16  2:08 AM  Result Value Ref Range   Specimen Description BLOOD LEFT  ANTECUBITAL    Special Requests      BOTTLES DRAWN AEROBIC AND ANAEROBIC 10CCAERO,10CCANA   Culture NO GROWTH < 12 HOURS    Report Status PENDING   Blood culture (routine x 2)     Status: None (Preliminary result)   Collection Time: 06/25/16  2:09 AM  Result Value Ref Range   Specimen Description BLOOD LEFT WRIST    Special Requests BOTTLES DRAWN AEROBIC AND ANAEROBIC 2CCAERO,3CCANA    Culture NO GROWTH < 12 HOURS    Report Status PENDING   Influenza panel by PCR (type A & B, H1N1)     Status: None   Collection Time: 06/25/16  2:09 AM  Result Value Ref Range   Influenza A By PCR NEGATIVE NEGATIVE   Influenza B By PCR NEGATIVE NEGATIVE    Comment: (NOTE) The Xpert Xpress Flu assay is intended as an aid in the diagnosis of  influenza and should not be used as a sole basis for treatment.  This  assay is FDA approved for nasopharyngeal swab specimens only. Nasal  washings and aspirates are unacceptable for Xpert Xpress Flu testing.   Lactic acid, plasma     Status:  Abnormal   Collection Time: 06/25/16  4:12 AM  Result Value Ref Range   Lactic Acid, Venous 2.4 (HH) 0.5 - 1.9 mmol/L    Comment: CRITICAL RESULT CALLED TO, READ BACK BY AND VERIFIED WITH DAVID WALKER AT 5093 06/25/16.PMH  Lactic acid, plasma     Status: Abnormal   Collection Time: 06/25/16  5:59 AM  Result Value Ref Range   Lactic Acid, Venous 2.9 (HH) 0.5 - 1.9 mmol/L    Comment: CRITICAL RESULT CALLED TO, READ BACK BY AND VERIFIED WITH OLIVIA BOATNER AT 2671 06/25/16.PMH   Ct Abdomen Pelvis Wo Contrast  Result Date: 06/25/2016 CLINICAL DATA:  82 year old male with sepsis and abdominal pain. History of prostate cancer. EXAM: CT CHEST, ABDOMEN AND PELVIS WITHOUT CONTRAST TECHNIQUE: Multidetector CT imaging of the chest, abdomen and pelvis was performed following the standard protocol without IV contrast. COMPARISON:  None. FINDINGS: Evaluation of this exam is limited in the absence of intravenous contrast. CT CHEST  FINDINGS Cardiovascular: There is no cardiomegaly or pericardial effusion. Multi vessel coronary vascular calcification involving the LAD, left circumflex, and RCA. There is atherosclerotic calcification of the thoracic aorta. No aneurysmal dilatation. The central pulmonary arteries are grossly unremarkable on this noncontrast study. Mediastinum/Nodes: No hilar or mediastinal adenopathy. There is a moderate size hiatal hernia. The esophagus is grossly unremarkable. Subcentimeter right thyroid nodule. No mediastinal collection or hematoma. Lungs/Pleura: Bibasilar dependent atelectatic changes. Left lung base compressive atelectasis secondary to hiatal hernia. Pneumonia is less likely. There is no pleural effusion or pneumothorax. The central airways are patent. Musculoskeletal: No axillary adenopathy. The chest wall soft tissues appear unremarkable. There is osteopenia with degenerative changes of the spine. No acute fracture. CT ABDOMEN PELVIS FINDINGS No intra-abdominal free air. Small subhepatic and pericholecystic fluid. Hepatobiliary: The liver is grossly unremarkable. There multiple stones within the gallbladder fundus. A stone is noted at the gallbladder neck. There is diffuse gallbladder wall thickening and edema. A 5.6 x 6.3 cm fluid collection noted in the right upper abdomen inferior to the gallbladder. This collection does not demonstrate an organized wall or definite evidence of loculation. Pancreas: The pancreas is atrophic. No inflammatory changes of pancreas. Spleen: Normal in size without focal abnormality. Adrenals/Urinary Tract: The adrenal glands appear unremarkable. The left kidney is atrophic. There is a 15 mm stone in the left kidney. There are two adjacent stones in the distal left ureter measuring up to 6 mm. There is moderate left hydronephrosis. Punctate nonobstructing right renal upper pole calculus. There is no hydronephrosis on the right. The right ureter appears unremarkable. The  urinary bladder is predominantly collapsed. There is apparent diffuse thickening of the bladder wall which may be partly related to underdistention. Cystitis is not excluded. Correlation with urinalysis recommended. Stomach/Bowel: There is extensive sigmoid diverticulosis without active inflammatory changes. Moderate size hiatal hernia noted. There is no evidence of bowel obstruction for active inflammation. The appendix appears unremarkable. Vascular/Lymphatic: There is moderate aortoiliac atherosclerotic disease. No aneurysmal dilatation. No portal venous gas identified. There is no adenopathy. Reproductive: The prostate and seminal vesicles are grossly unremarkable. Other: Small fat containing left inguinal hernia. No fluid collection. Musculoskeletal: Osteopenia.  No acute fracture. IMPRESSION: Cholelithiasis with findings most compatible with acute cholecystitis. A 4 mm stone at the neck of the gallbladder is concerning for an obstructing gallstone. Correlation with clinical exam recommended. Ultrasound may provide additional information. Small amount of fluid inferior to the gallbladder does not demonstrate loculation or organized wall. Atrophic left kidney. Two adjacent  distal left ureteral calculi measuring up to 6 mm with moderate left hydronephrosis. A 15 mm left renal calculus is also noted. Punctate nonobstructing right renal upper pole stone. No hydronephrosis. Thickened bladder wall. Correlation with urinalysis recommended to evaluate for cystitis. Extensive colonic diverticulosis. No evidence of bowel obstruction or active inflammation. She Electronically Signed   By: Anner Crete M.D.   On: 06/25/2016 02:39   Ct Chest Wo Contrast  Result Date: 06/25/2016 CLINICAL DATA:  82 year old male with sepsis and abdominal pain. History of prostate cancer. EXAM: CT CHEST, ABDOMEN AND PELVIS WITHOUT CONTRAST TECHNIQUE: Multidetector CT imaging of the chest, abdomen and pelvis was performed following the  standard protocol without IV contrast. COMPARISON:  None. FINDINGS: Evaluation of this exam is limited in the absence of intravenous contrast. CT CHEST FINDINGS Cardiovascular: There is no cardiomegaly or pericardial effusion. Multi vessel coronary vascular calcification involving the LAD, left circumflex, and RCA. There is atherosclerotic calcification of the thoracic aorta. No aneurysmal dilatation. The central pulmonary arteries are grossly unremarkable on this noncontrast study. Mediastinum/Nodes: No hilar or mediastinal adenopathy. There is a moderate size hiatal hernia. The esophagus is grossly unremarkable. Subcentimeter right thyroid nodule. No mediastinal collection or hematoma. Lungs/Pleura: Bibasilar dependent atelectatic changes. Left lung base compressive atelectasis secondary to hiatal hernia. Pneumonia is less likely. There is no pleural effusion or pneumothorax. The central airways are patent. Musculoskeletal: No axillary adenopathy. The chest wall soft tissues appear unremarkable. There is osteopenia with degenerative changes of the spine. No acute fracture. CT ABDOMEN PELVIS FINDINGS No intra-abdominal free air. Small subhepatic and pericholecystic fluid. Hepatobiliary: The liver is grossly unremarkable. There multiple stones within the gallbladder fundus. A stone is noted at the gallbladder neck. There is diffuse gallbladder wall thickening and edema. A 5.6 x 6.3 cm fluid collection noted in the right upper abdomen inferior to the gallbladder. This collection does not demonstrate an organized wall or definite evidence of loculation. Pancreas: The pancreas is atrophic. No inflammatory changes of pancreas. Spleen: Normal in size without focal abnormality. Adrenals/Urinary Tract: The adrenal glands appear unremarkable. The left kidney is atrophic. There is a 15 mm stone in the left kidney. There are two adjacent stones in the distal left ureter measuring up to 6 mm. There is moderate left  hydronephrosis. Punctate nonobstructing right renal upper pole calculus. There is no hydronephrosis on the right. The right ureter appears unremarkable. The urinary bladder is predominantly collapsed. There is apparent diffuse thickening of the bladder wall which may be partly related to underdistention. Cystitis is not excluded. Correlation with urinalysis recommended. Stomach/Bowel: There is extensive sigmoid diverticulosis without active inflammatory changes. Moderate size hiatal hernia noted. There is no evidence of bowel obstruction for active inflammation. The appendix appears unremarkable. Vascular/Lymphatic: There is moderate aortoiliac atherosclerotic disease. No aneurysmal dilatation. No portal venous gas identified. There is no adenopathy. Reproductive: The prostate and seminal vesicles are grossly unremarkable. Other: Small fat containing left inguinal hernia. No fluid collection. Musculoskeletal: Osteopenia.  No acute fracture. IMPRESSION: Cholelithiasis with findings most compatible with acute cholecystitis. A 4 mm stone at the neck of the gallbladder is concerning for an obstructing gallstone. Correlation with clinical exam recommended. Ultrasound may provide additional information. Small amount of fluid inferior to the gallbladder does not demonstrate loculation or organized wall. Atrophic left kidney. Two adjacent distal left ureteral calculi measuring up to 6 mm with moderate left hydronephrosis. A 15 mm left renal calculus is also noted. Punctate nonobstructing right renal upper pole stone. No  hydronephrosis. Thickened bladder wall. Correlation with urinalysis recommended to evaluate for cystitis. Extensive colonic diverticulosis. No evidence of bowel obstruction or active inflammation. She Electronically Signed   By: Anner Crete M.D.   On: 06/25/2016 02:39   Dg Chest Portable 1 View  Result Date: 06/25/2016 CLINICAL DATA:  82 year old male with acute respiratory distress. EXAM: PORTABLE  CHEST 1 VIEW COMPARISON:  Chest radiograph dated 12/01/2010 and abdominal CT dated 07/12/2015 FINDINGS: Mild diffuse interstitial coarsening. No focal consolidation, pleural effusion, or pneumothorax. Moderate size hiatal hernia. Stable cardiac silhouette. No acute osseous pathology. IMPRESSION: No acute cardiopulmonary process. Moderate hiatal hernia. Electronically Signed   By: Anner Crete M.D.   On: 06/25/2016 00:50    Review of Systems  Constitutional: Negative for chills and fever.  HENT: Negative for sore throat and tinnitus.   Eyes: Negative for blurred vision and redness.  Respiratory: Positive for shortness of breath. Negative for cough.   Cardiovascular: Negative for chest pain, palpitations, orthopnea and PND.  Gastrointestinal: Positive for abdominal pain. Negative for diarrhea, nausea and vomiting.  Genitourinary: Negative for dysuria, frequency and urgency.  Musculoskeletal: Negative for joint pain and myalgias.  Skin: Negative for rash.       No lesions  Neurological: Negative for speech change, focal weakness and weakness.  Endo/Heme/Allergies: Does not bruise/bleed easily.       No temperature intolerance  Psychiatric/Behavioral: Negative for depression and suicidal ideas.    Blood pressure (!) 103/49, pulse 95, temperature 98.7 F (37.1 C), temperature source Oral, resp. rate 18, height '5\' 8"'  (1.727 m), weight 75.8 kg (167 lb), SpO2 96 %. Physical Exam  Constitutional: He is oriented to person, place, and time. He appears well-developed and well-nourished. No distress.  HENT:  Head: Normocephalic and atraumatic.  Mouth/Throat: Oropharynx is clear and moist.  Eyes: Conjunctivae and EOM are normal. Pupils are equal, round, and reactive to light. No scleral icterus.  Neck: Normal range of motion. Neck supple. No JVD present. No tracheal deviation present. No thyromegaly present.  Large cystic mass posterior left aspect of neck  Cardiovascular: Normal rate, regular  rhythm and normal heart sounds.  Exam reveals no gallop and no friction rub.   No murmur heard. Respiratory: Effort normal and breath sounds normal. No respiratory distress.  GI: Soft. Bowel sounds are normal. He exhibits distension. There is tenderness. There is no rebound and no guarding.  Genitourinary:  Genitourinary Comments: Deferred  Musculoskeletal: Normal range of motion. He exhibits no edema.  Lymphadenopathy:    He has no cervical adenopathy.  Neurological: He is alert and oriented to person, place, and time. No cranial nerve deficit.  Skin: Skin is warm and dry. No rash noted. No erythema.  Psychiatric: He has a normal mood and affect. His behavior is normal. Judgment and thought content normal.     Assessment/Plan This is an 82 year old male admitted for sepsis secondary to cholecystitis. 1. Sepsis: Secondary to cholecystitis; he meets criteria via heart rate and respiratory rate. The patient received ciprofloxacin and Flagyl in the emergency department. We will continue with meropenem. He is hemodynamically stable although lactic acid is elevated. Follow blood cultures for growth and sensitivities. 2. Cholecystitis: The patient is not an optimal surgical candidate area surgery will follow alongside medicine after 24 hours of antibiotics to determine if percutaneous drain placement is necessary. 3. Chronic kidney disease: Stage IV; avoid nephrotoxic agents. Hydrate with intravenous fluid. 4. CHF: Systolic; chronic, stable. Continue torsemide and metoprolol per home regimen 5. Hypertension:  Controlled; continue hydralazine and losartan 6. Elevated troponin: Continue to follow cardiac enzymes. Likely secondary to sepsis causing demand ischemia and/or poor renal clearance 7. Hypothyroidism: Continue Synthroid 8. Prostate cancer: Recent Lupron injection. Continue tamsulosin and Cardura for urinary retention 9. Hyperlipidemia: Continue statin therapy 10. DVT prophylaxis: Heparin 11.  GI prophylaxis: Pantoprazole per home regimen The patient is a full code. Time spent on admission orders and patient care approximately 45 minutes  Harrie Foreman, MD 06/25/2016, 7:00 AM

## 2016-06-25 NOTE — ED Notes (Signed)
Admitting MD at bedside.

## 2016-06-25 NOTE — ED Notes (Signed)
NM Hepatobiliary Liver Func completed by mistake, order is still active.

## 2016-06-25 NOTE — Progress Notes (Signed)
[name] is a 82 y.o. male  [id number]  Primary Cardiologist: Neoma Laming Reason for Consultation: Atrial fib with RVR  HPI: 35 YOWF presented with sepsis and cholecystitis, and now is getting HIDA scan after CT showed gallstones yesterday. I was asked to see as had chest pains and palpitation due afib with RVR.   Review of Systems: No orthopnea/PND   Past Medical History:  Diagnosis Date  . Cardiac disease   . CHF (congestive heart failure) ([place])   . Hypercholesteremia   . Hypertension   . Kidney failure    left  . Osteoporosis   . Prostate cancer ([place])   . Shingles   . Skin cancer    right cheek    Medications Prior to Admission  Medication Sig Dispense Refill  . albuterol (PROVENTIL HFA;VENTOLIN HFA) 108 (90 BASE) MCG/ACT inhaler Inhale 2 puffs into the lungs every 6 (six) hours as needed for wheezing or shortness of breath.    Marland Kitchen aspirin EC 81 MG tablet Take 81 mg by mouth at bedtime.    Marland Kitchen atorvastatin (LIPITOR) 40 MG tablet Take 40 mg by mouth at bedtime.     . Cholecalciferol (VITAMIN D3) 5000 units TABS Take 5,000 Units by mouth daily.    Marland Kitchen doxazosin (CARDURA) 4 MG tablet Take 4 mg by mouth at bedtime.     . ferrous sulfate 325 (65 FE) MG tablet Take 325 mg by mouth 2 (two) times daily.     . hydrALAZINE (APRESOLINE) 25 MG tablet Take 1 tablet (25 mg total) by mouth every 8 (eight) hours. (Patient taking differently: Take 25 mg by mouth 2 (two) times daily. ) 90 tablet 6  . isosorbide mononitrate (IMDUR) 30 MG 24 hr tablet Take 1 tablet (30 mg total) by mouth daily. 30 tablet 6  . levothyroxine (SYNTHROID, LEVOTHROID) 100 MCG tablet Take 100 mcg by mouth daily.    Marland Kitchen losartan (COZAAR) 100 MG tablet Take 100 mg by mouth daily. Reported on 11/24/2015    . metoprolol succinate (TOPROL-XL) 50 MG 24 hr tablet Take 50 mg by mouth daily.     . nitroGLYCERIN (NITROSTAT) 0.4 MG SL tablet Place 0.4 mg under the tongue every 5 (five) minutes as needed for chest pain.    Marland Kitchen  omega-3 acid ethyl esters (LOVAZA) 1 G capsule Take 1 capsule by mouth 2 (two) times daily.    . pantoprazole (PROTONIX) 40 MG tablet Take 40 mg by mouth 2 (two) times daily.     . potassium chloride SA (K-DUR,KLOR-CON) 20 MEQ tablet Take 20 mEq by mouth 2 (two) times daily.     . sodium bicarbonate 650 MG tablet Take 1,300 mg by mouth 2 (two) times daily.     . tamsulosin (FLOMAX) 0.4 MG CAPS capsule Take 0.4 mg by mouth daily.    Marland Kitchen torsemide (DEMADEX) 20 MG tablet Take 20 mg by mouth 2 (two) times daily.        Marland Kitchen aspirin EC  81 mg Oral QHS  . atorvastatin  40 mg Oral QHS  . cholecalciferol  5,000 Units Oral Daily  . docusate sodium  100 mg Oral BID  . doxazosin  4 mg Oral QHS  . ferrous sulfate  325 mg Oral BID  . heparin  5,000 Units Subcutaneous Q8H  . hydrALAZINE  25 mg Oral BID  . isosorbide mononitrate  30 mg Oral Daily  . levothyroxine  100 mcg Oral Daily  . meropenem  500 mg  Intravenous Q12H  . metoprolol succinate  50 mg Oral Daily  . omega-3 acid ethyl esters  1 capsule Oral BID  . pantoprazole  40 mg Oral BID  . potassium chloride SA  20 mEq Oral BID  . sodium bicarbonate  1,300 mg Oral BID  . sodium chloride flush  3 mL Intravenous Q12H  . tamsulosin  0.4 mg Oral Daily  . torsemide  20 mg Oral BID    Infusions: . sodium chloride 100 mL/hr at 06/25/16 1212    Allergies  Allergen Reactions  . Ativan [Lorazepam] Other (See Comments)    Hallucinations, combative  . Penicillin G Other (See Comments)    Has patient had a PCN reaction causing immediate rash, facial/tongue/throat swelling, SOB or lightheadedness with hypotension: Yes Has patient had a PCN reaction causing severe rash involving mucus membranes or skin necrosis: No Has patient had a PCN reaction that required hospitalization No Has patient had a PCN reaction occurring within the last 10 years: No If all of the above answers are "NO", then may proceed with Cephalosporin use.     Social History    Social History  . Marital status: Married    Spouse name: N/A  . Number of children: N/A  . Years of education: N/A   Occupational History  . Not on file.   Social History Main Topics  . Smoking status: Former Smoker    Types: Cigarettes, Cigars  . Smokeless tobacco: Current User    Types: Chew  . Alcohol use No  . Drug use: Unknown  . Sexual activity: Not on file   Other Topics Concern  . Not on file   Social History Narrative  . No narrative on file    Family History  Problem Relation Age of Onset  . CAD Mother     PHYSICAL EXAM: Vitals:   06/25/16 0856 06/25/16 1300  BP: 134/72 128/73  Pulse: 91 85  Resp: 20 19  Temp:  98.1 F (36.7 C)     Intake/Output Summary (Last 24 hours) at 06/25/16 1413 Last data filed at 06/25/16 1300  Gross per 24 hour  Intake             1050 ml  Output              350 ml  Net              700 ml    General:  Well appearing. No respiratory difficulty HEENT: normal Neck: supple. no JVD. Carotids 2+ bilat; no bruits. No lymphadenopathy or thryomegaly appreciated. Cor: PMI nondisplaced. Regular rate & rhythm. No rubs, gallops or murmurs. Lungs: clear Abdomen: soft, nontender, nondistended. No hepatosplenomegaly. No bruits or masses. Good bowel sounds. Extremities: no cyanosis, clubbing, rash, edema Neuro: alert & oriented x 3, cranial nerves grossly intact. moves all 4 extremities w/o difficulty. Affect pleasant.  ECG: afib with RVR on monitor but no new EKG available.  Results for orders placed or performed during the hospital encounter of 06/24/16 (from the past 24 hour(s))  CBC with Differential     Status: Abnormal   Collection Time: 06/25/16 12:09 AM  Result Value Ref Range   WBC 5.7 3.8 - 10.6 K/uL   RBC 4.37 (L) 4.40 - 5.90 MIL/uL   Hemoglobin 13.4 13.0 - 18.0 g/dL   HCT 38.8 (L) 40.0 - 52.0 %   MCV 88.7 80.0 - 100.0 fL   MCH 30.7 26.0 - 34.0 pg   MCHC 34.6 32.0 -  36.0 g/dL   RDW 14.8 (H) 11.5 - 14.5 %    Platelets 132 (L) 150 - 440 K/uL   Neutrophils Relative % 87 %   Neutro Abs 5.0 1.4 - 6.5 K/uL   Lymphocytes Relative 4 %   Lymphs Abs 0.2 (L) 1.0 - 3.6 K/uL   Monocytes Relative 9 %   Monocytes Absolute 0.5 0.2 - 1.0 K/uL   Eosinophils Relative 0 %   Eosinophils Absolute 0.0 0 - 0.7 K/uL   Basophils Relative 0 %   Basophils Absolute 0.0 0 - 0.1 K/uL  Comprehensive metabolic panel     Status: Abnormal   Collection Time: 06/25/16 12:09 AM  Result Value Ref Range   Sodium 133 (L) 135 - 145 mmol/L   Potassium 3.9 3.5 - 5.1 mmol/L   Chloride 103 101 - 111 mmol/L   CO2 20 (L) 22 - 32 mmol/L   Glucose, Bld 101 (H) 65 - 99 mg/dL   BUN 35 (H) 6 - 20 mg/dL   Creatinine, Ser 3.10 (H) 0.61 - 1.24 mg/dL   Calcium 8.8 (L) 8.9 - 10.3 mg/dL   Total Protein 6.3 (L) 6.5 - 8.1 g/dL   Albumin 3.3 (L) 3.5 - 5.0 g/dL   AST 26 15 - 41 U/L   ALT 15 (L) 17 - 63 U/L   Alkaline Phosphatase 42 38 - 126 U/L   Total Bilirubin 1.5 (H) 0.3 - 1.2 mg/dL   GFR calc non Af Amer 17 (L) >60 mL/min   GFR calc Af Amer 19 (L) >60 mL/min   Anion gap 10 5 - 15  Lactic acid, plasma     Status: Abnormal   Collection Time: 06/25/16 12:09 AM  Result Value Ref Range   Lactic Acid, Venous 2.4 (HH) 0.5 - 1.9 mmol/L  Type and screen [facility]     Status: None   Collection Time: 06/25/16 12:09 AM  Result Value Ref Range   ABO/RH(D) AB POS    Antibody Screen NEG    Sample Expiration 06/28/2016   Protime-INR     Status: None   Collection Time: 06/25/16 12:09 AM  Result Value Ref Range   Prothrombin Time 14.5 11.4 - 15.2 seconds   INR 1.12   Troponin I     Status: Abnormal   Collection Time: 06/25/16 12:09 AM  Result Value Ref Range   Troponin I 0.04 (HH) <0.03 ng/mL  TSH     Status: None   Collection Time: 06/25/16 12:09 AM  Result Value Ref Range   TSH 1.438 0.350 - 4.500 uIU/mL  Urinalysis, Complete w Microscopic     Status: Abnormal   Collection Time: 06/25/16  2:08 AM  Result Value Ref  Range   Color, Urine YELLOW (A) YELLOW   APPearance CLOUDY (A) CLEAR   Specific Gravity, Urine 1.013 1.005 - 1.030   pH 5.0 5.0 - 8.0   Glucose, UA 50 (A) NEGATIVE mg/dL   Hgb urine dipstick SMALL (A) NEGATIVE   Bilirubin Urine NEGATIVE NEGATIVE   Ketones, ur NEGATIVE NEGATIVE mg/dL   Protein, ur 30 (A) NEGATIVE mg/dL   Nitrite NEGATIVE NEGATIVE   Leukocytes, UA NEGATIVE NEGATIVE   RBC / HPF 0-5 0 - 5 RBC/hpf   WBC, UA 0-5 0 - 5 WBC/hpf   Bacteria, UA RARE (A) NONE SEEN   Squamous Epithelial / LPF 0-5 (A) NONE SEEN   Mucous PRESENT    Hyaline Casts, UA PRESENT    Granular Casts, UA  PRESENT    Amorphous Crystal PRESENT   Blood culture (routine x 2)     Status: None (Preliminary result)   Collection Time: 06/25/16  2:08 AM  Result Value Ref Range   Specimen Description BLOOD LEFT ANTECUBITAL    Special Requests      BOTTLES DRAWN AEROBIC AND ANAEROBIC 10CCAERO,10CCANA   Culture NO GROWTH < 12 HOURS    Report Status PENDING   Blood culture (routine x 2)     Status: None (Preliminary result)   Collection Time: 06/25/16  2:09 AM  Result Value Ref Range   Specimen Description BLOOD LEFT WRIST    Special Requests BOTTLES DRAWN AEROBIC AND ANAEROBIC 2CCAERO,3CCANA    Culture NO GROWTH < 12 HOURS    Report Status PENDING   Influenza panel by PCR (type A & B, H1N1)     Status: None   Collection Time: 06/25/16  2:09 AM  Result Value Ref Range   Influenza A By PCR NEGATIVE NEGATIVE   Influenza B By PCR NEGATIVE NEGATIVE  Lactic acid, plasma     Status: Abnormal   Collection Time: 06/25/16  4:12 AM  Result Value Ref Range   Lactic Acid, Venous 2.4 (HH) 0.5 - 1.9 mmol/L  Lactic acid, plasma     Status: Abnormal   Collection Time: 06/25/16  5:59 AM  Result Value Ref Range   Lactic Acid, Venous 2.9 (HH) 0.5 - 1.9 mmol/L  Lactic acid, plasma     Status: None   Collection Time: 06/25/16 12:48 PM  Result Value Ref Range   Lactic Acid, Venous 1.0 0.5 - 1.9 mmol/L   Ct Abdomen Pelvis  Wo Contrast  Result Date: 06/25/2016 CLINICAL DATA:  82 year old male with sepsis and abdominal pain. History of prostate cancer. EXAM: CT CHEST, ABDOMEN AND PELVIS WITHOUT CONTRAST TECHNIQUE: Multidetector CT imaging of the chest, abdomen and pelvis was performed following the standard protocol without IV contrast. COMPARISON:  None. FINDINGS: Evaluation of this exam is limited in the absence of intravenous contrast. CT CHEST FINDINGS Cardiovascular: There is no cardiomegaly or pericardial effusion. Multi vessel coronary vascular calcification involving the LAD, left circumflex, and RCA. There is atherosclerotic calcification of the thoracic aorta. No aneurysmal dilatation. The central pulmonary arteries are grossly unremarkable on this noncontrast study. Mediastinum/Nodes: No hilar or mediastinal adenopathy. There is a moderate size hiatal hernia. The esophagus is grossly unremarkable. Subcentimeter right thyroid nodule. No mediastinal collection or hematoma. Lungs/Pleura: Bibasilar dependent atelectatic changes. Left lung base compressive atelectasis secondary to hiatal hernia. Pneumonia is less likely. There is no pleural effusion or pneumothorax. The central airways are patent. Musculoskeletal: No axillary adenopathy. The chest wall soft tissues appear unremarkable. There is osteopenia with degenerative changes of the spine. No acute fracture. CT ABDOMEN PELVIS FINDINGS No intra-abdominal free air. Small subhepatic and pericholecystic fluid. Hepatobiliary: The liver is grossly unremarkable. There multiple stones within the gallbladder fundus. A stone is noted at the gallbladder neck. There is diffuse gallbladder wall thickening and edema. A 5.6 x 6.3 cm fluid collection noted in the right upper abdomen inferior to the gallbladder. This collection does not demonstrate an organized wall or definite evidence of loculation. Pancreas: The pancreas is atrophic. No inflammatory changes of pancreas. Spleen: Normal  in size without focal abnormality. Adrenals/Urinary Tract: The adrenal glands appear unremarkable. The left kidney is atrophic. There is a 15 mm stone in the left kidney. There are two adjacent stones in the distal left ureter measuring up to 6 mm.  There is moderate left hydronephrosis. Punctate nonobstructing right renal upper pole calculus. There is no hydronephrosis on the right. The right ureter appears unremarkable. The urinary bladder is predominantly collapsed. There is apparent diffuse thickening of the bladder wall which may be partly related to underdistention. Cystitis is not excluded. Correlation with urinalysis recommended. Stomach/Bowel: There is extensive sigmoid diverticulosis without active inflammatory changes. Moderate size hiatal hernia noted. There is no evidence of bowel obstruction for active inflammation. The appendix appears unremarkable. Vascular/Lymphatic: There is moderate aortoiliac atherosclerotic disease. No aneurysmal dilatation. No portal venous gas identified. There is no adenopathy. Reproductive: The prostate and seminal vesicles are grossly unremarkable. Other: Small fat containing left inguinal hernia. No fluid collection. Musculoskeletal: Osteopenia.  No acute fracture. IMPRESSION: Cholelithiasis with findings most compatible with acute cholecystitis. A 4 mm stone at the neck of the gallbladder is concerning for an obstructing gallstone. Correlation with clinical exam recommended. Ultrasound may provide additional information. Small amount of fluid inferior to the gallbladder does not demonstrate loculation or organized wall. Atrophic left kidney. Two adjacent distal left ureteral calculi measuring up to 6 mm with moderate left hydronephrosis. A 15 mm left renal calculus is also noted. Punctate nonobstructing right renal upper pole stone. No hydronephrosis. Thickened bladder wall. Correlation with urinalysis recommended to evaluate for cystitis. Extensive colonic diverticulosis.  No evidence of bowel obstruction or active inflammation. She Electronically Signed   By: Anner Crete M.D.   On: 06/25/2016 02:39   Ct Chest Wo Contrast  Result Date: 06/25/2016 CLINICAL DATA:  82 year old male with sepsis and abdominal pain. History of prostate cancer. EXAM: CT CHEST, ABDOMEN AND PELVIS WITHOUT CONTRAST TECHNIQUE: Multidetector CT imaging of the chest, abdomen and pelvis was performed following the standard protocol without IV contrast. COMPARISON:  None. FINDINGS: Evaluation of this exam is limited in the absence of intravenous contrast. CT CHEST FINDINGS Cardiovascular: There is no cardiomegaly or pericardial effusion. Multi vessel coronary vascular calcification involving the LAD, left circumflex, and RCA. There is atherosclerotic calcification of the thoracic aorta. No aneurysmal dilatation. The central pulmonary arteries are grossly unremarkable on this noncontrast study. Mediastinum/Nodes: No hilar or mediastinal adenopathy. There is a moderate size hiatal hernia. The esophagus is grossly unremarkable. Subcentimeter right thyroid nodule. No mediastinal collection or hematoma. Lungs/Pleura: Bibasilar dependent atelectatic changes. Left lung base compressive atelectasis secondary to hiatal hernia. Pneumonia is less likely. There is no pleural effusion or pneumothorax. The central airways are patent. Musculoskeletal: No axillary adenopathy. The chest wall soft tissues appear unremarkable. There is osteopenia with degenerative changes of the spine. No acute fracture. CT ABDOMEN PELVIS FINDINGS No intra-abdominal free air. Small subhepatic and pericholecystic fluid. Hepatobiliary: The liver is grossly unremarkable. There multiple stones within the gallbladder fundus. A stone is noted at the gallbladder neck. There is diffuse gallbladder wall thickening and edema. A 5.6 x 6.3 cm fluid collection noted in the right upper abdomen inferior to the gallbladder. This collection does not  demonstrate an organized wall or definite evidence of loculation. Pancreas: The pancreas is atrophic. No inflammatory changes of pancreas. Spleen: Normal in size without focal abnormality. Adrenals/Urinary Tract: The adrenal glands appear unremarkable. The left kidney is atrophic. There is a 15 mm stone in the left kidney. There are two adjacent stones in the distal left ureter measuring up to 6 mm. There is moderate left hydronephrosis. Punctate nonobstructing right renal upper pole calculus. There is no hydronephrosis on the right. The right ureter appears unremarkable. The urinary bladder is predominantly collapsed.  There is apparent diffuse thickening of the bladder wall which may be partly related to underdistention. Cystitis is not excluded. Correlation with urinalysis recommended. Stomach/Bowel: There is extensive sigmoid diverticulosis without active inflammatory changes. Moderate size hiatal hernia noted. There is no evidence of bowel obstruction for active inflammation. The appendix appears unremarkable. Vascular/Lymphatic: There is moderate aortoiliac atherosclerotic disease. No aneurysmal dilatation. No portal venous gas identified. There is no adenopathy. Reproductive: The prostate and seminal vesicles are grossly unremarkable. Other: Small fat containing left inguinal hernia. No fluid collection. Musculoskeletal: Osteopenia.  No acute fracture. IMPRESSION: Cholelithiasis with findings most compatible with acute cholecystitis. A 4 mm stone at the neck of the gallbladder is concerning for an obstructing gallstone. Correlation with clinical exam recommended. Ultrasound may provide additional information. Small amount of fluid inferior to the gallbladder does not demonstrate loculation or organized wall. Atrophic left kidney. Two adjacent distal left ureteral calculi measuring up to 6 mm with moderate left hydronephrosis. A 15 mm left renal calculus is also noted. Punctate nonobstructing right renal upper  pole stone. No hydronephrosis. Thickened bladder wall. Correlation with urinalysis recommended to evaluate for cystitis. Extensive colonic diverticulosis. No evidence of bowel obstruction or active inflammation. She Electronically Signed   By: Anner Crete M.D.   On: 06/25/2016 02:39   Dg Chest Portable 1 View  Result Date: 06/25/2016 CLINICAL DATA:  82 year old male with acute respiratory distress. EXAM: PORTABLE CHEST 1 VIEW COMPARISON:  Chest radiograph dated 12/01/2010 and abdominal CT dated 07/12/2015 FINDINGS: Mild diffuse interstitial coarsening. No focal consolidation, pleural effusion, or pneumothorax. Moderate size hiatal hernia. Stable cardiac silhouette. No acute osseous pathology. IMPRESSION: No acute cardiopulmonary process. Moderate hiatal hernia. Electronically Signed   By: Anner Crete M.D.   On: 06/25/2016 00:50     ASSESSMENT AND PLAN: Afib with RVR/Atypical chest pain in a setting with sepsis and cholecystitis. He has h/o afib and due to sepsis rate went up. Adjust meds to control rate.  Rondal Vandevelde A

## 2016-06-25 NOTE — Progress Notes (Signed)
Reported lactic acid of 2.9 to Dr. Marcille Blanco. Dr. Marcille Blanco To put in orders.Marland Kitchen

## 2016-06-26 ENCOUNTER — Inpatient Hospital Stay: Payer: Medicare Other

## 2016-06-26 LAB — HEMOGLOBIN A1C
Hgb A1c MFr Bld: 5.4 % (ref 4.8–5.6)
Mean Plasma Glucose: 108 mg/dL

## 2016-06-26 LAB — BASIC METABOLIC PANEL
ANION GAP: 8 (ref 5–15)
BUN: 45 mg/dL — ABNORMAL HIGH (ref 6–20)
CHLORIDE: 109 mmol/L (ref 101–111)
CO2: 19 mmol/L — AB (ref 22–32)
Calcium: 8 mg/dL — ABNORMAL LOW (ref 8.9–10.3)
Creatinine, Ser: 3 mg/dL — ABNORMAL HIGH (ref 0.61–1.24)
GFR calc non Af Amer: 17 mL/min — ABNORMAL LOW (ref >60)
GFR, EST AFRICAN AMERICAN: 20 mL/min — AB (ref >60)
Glucose, Bld: 107 mg/dL — ABNORMAL HIGH (ref 65–99)
Potassium: 4 mmol/L (ref 3.5–5.1)
Sodium: 136 mmol/L (ref 135–145)

## 2016-06-26 LAB — BLOOD GAS, ARTERIAL
Acid-base deficit: 5.2 mmol/L — ABNORMAL HIGH (ref 0.0–2.0)
Bicarbonate: 18.9 mmol/L — ABNORMAL LOW (ref 20.0–28.0)
FIO2: 0.28
O2 Saturation: 93.9 %
PCO2 ART: 32 mmHg (ref 32.0–48.0)
PO2 ART: 72 mmHg — AB (ref 83.0–108.0)
Patient temperature: 37
pH, Arterial: 7.38 (ref 7.350–7.450)

## 2016-06-26 LAB — CBC
HEMATOCRIT: 32.6 % — AB (ref 40.0–52.0)
HEMOGLOBIN: 11.2 g/dL — AB (ref 13.0–18.0)
MCH: 31.1 pg (ref 26.0–34.0)
MCHC: 34.4 g/dL (ref 32.0–36.0)
MCV: 90.2 fL (ref 80.0–100.0)
Platelets: 114 10*3/uL — ABNORMAL LOW (ref 150–440)
RBC: 3.62 MIL/uL — ABNORMAL LOW (ref 4.40–5.90)
RDW: 15.4 % — ABNORMAL HIGH (ref 11.5–14.5)
WBC: 5.8 10*3/uL (ref 3.8–10.6)

## 2016-06-26 LAB — ECHOCARDIOGRAM COMPLETE
HEIGHTINCHES: 68 in
WEIGHTICAEL: 2672 [oz_av]

## 2016-06-26 MED ORDER — FUROSEMIDE 10 MG/ML IJ SOLN
20.0000 mg | Freq: Once | INTRAMUSCULAR | Status: AC
  Administered 2016-06-26: 20 mg via INTRAVENOUS
  Filled 2016-06-26: qty 2

## 2016-06-26 NOTE — Progress Notes (Signed)
CC: Abdominal pain Subjective: Per report patient was more confused and somnolent overnight. He lost his IV access and had to be reoriented. Upon my questioning the patient's afternoon he's arousable and appropriate. States he actually feels somewhat better today than yesterday. Continues to have some abdominal pain.  Objective: Vital signs in last 24 hours: Temp:  [98.6 F (37 C)-99.5 F (37.5 C)] 99 F (37.2 C) (12/19 1220) Pulse Rate:  [71-108] 88 (12/19 1220) Resp:  [18-24] 20 (12/19 1220) BP: (127-163)/(47-64) 140/64 (12/19 1220) SpO2:  [95 %-98 %] 97 % (12/19 1220) Last BM Date: 06/24/16  Intake/Output from previous day: 12/18 0701 - 12/19 0700 In: 2587 [P.O.:45; I.V.:2442; IV Piggyback:100] Out: 750 [Urine:750] Intake/Output this shift: Total I/O In: -  Out: 200 [Urine:200]  Physical exam:  Gen.: No acute distress Chest: Clear to auscultation Heart: Regular rhythm Abdomen: Large, soft, mildly tender to palpation on the right and left upper quadrants. No evidence of peritonitis or guarding.  Lab Results: CBC   Recent Labs  06/25/16 0009 06/26/16 0449  WBC 5.7 5.8  HGB 13.4 11.2*  HCT 38.8* 32.6*  PLT 132* 114*   BMET  Recent Labs  06/25/16 0009 06/26/16 0449  NA 133* 136  K 3.9 4.0  CL 103 109  CO2 20* 19*  GLUCOSE 101* 107*  BUN 35* 45*  CREATININE 3.10* 3.00*  CALCIUM 8.8* 8.0*   PT/INR  Recent Labs  06/25/16 0009  LABPROT 14.5  INR 1.12   ABG  Recent Labs  06/26/16 1145  PHART 7.38  HCO3 18.9*    Studies/Results: Ct Abdomen Pelvis Wo Contrast  Result Date: 06/25/2016 CLINICAL DATA:  82 year old male with sepsis and abdominal pain. History of prostate cancer. EXAM: CT CHEST, ABDOMEN AND PELVIS WITHOUT CONTRAST TECHNIQUE: Multidetector CT imaging of the chest, abdomen and pelvis was performed following the standard protocol without IV contrast. COMPARISON:  None. FINDINGS: Evaluation of this exam is limited in the absence of  intravenous contrast. CT CHEST FINDINGS Cardiovascular: There is no cardiomegaly or pericardial effusion. Multi vessel coronary vascular calcification involving the LAD, left circumflex, and RCA. There is atherosclerotic calcification of the thoracic aorta. No aneurysmal dilatation. The central pulmonary arteries are grossly unremarkable on this noncontrast study. Mediastinum/Nodes: No hilar or mediastinal adenopathy. There is a moderate size hiatal hernia. The esophagus is grossly unremarkable. Subcentimeter right thyroid nodule. No mediastinal collection or hematoma. Lungs/Pleura: Bibasilar dependent atelectatic changes. Left lung base compressive atelectasis secondary to hiatal hernia. Pneumonia is less likely. There is no pleural effusion or pneumothorax. The central airways are patent. Musculoskeletal: No axillary adenopathy. The chest wall soft tissues appear unremarkable. There is osteopenia with degenerative changes of the spine. No acute fracture. CT ABDOMEN PELVIS FINDINGS No intra-abdominal free air. Small subhepatic and pericholecystic fluid. Hepatobiliary: The liver is grossly unremarkable. There multiple stones within the gallbladder fundus. A stone is noted at the gallbladder neck. There is diffuse gallbladder wall thickening and edema. A 5.6 x 6.3 cm fluid collection noted in the right upper abdomen inferior to the gallbladder. This collection does not demonstrate an organized wall or definite evidence of loculation. Pancreas: The pancreas is atrophic. No inflammatory changes of pancreas. Spleen: Normal in size without focal abnormality. Adrenals/Urinary Tract: The adrenal glands appear unremarkable. The left kidney is atrophic. There is a 15 mm stone in the left kidney. There are two adjacent stones in the distal left ureter measuring up to 6 mm. There is moderate left hydronephrosis. Punctate nonobstructing right renal  upper pole calculus. There is no hydronephrosis on the right. The right ureter  appears unremarkable. The urinary bladder is predominantly collapsed. There is apparent diffuse thickening of the bladder wall which may be partly related to underdistention. Cystitis is not excluded. Correlation with urinalysis recommended. Stomach/Bowel: There is extensive sigmoid diverticulosis without active inflammatory changes. Moderate size hiatal hernia noted. There is no evidence of bowel obstruction for active inflammation. The appendix appears unremarkable. Vascular/Lymphatic: There is moderate aortoiliac atherosclerotic disease. No aneurysmal dilatation. No portal venous gas identified. There is no adenopathy. Reproductive: The prostate and seminal vesicles are grossly unremarkable. Other: Small fat containing left inguinal hernia. No fluid collection. Musculoskeletal: Osteopenia.  No acute fracture. IMPRESSION: Cholelithiasis with findings most compatible with acute cholecystitis. A 4 mm stone at the neck of the gallbladder is concerning for an obstructing gallstone. Correlation with clinical exam recommended. Ultrasound may provide additional information. Small amount of fluid inferior to the gallbladder does not demonstrate loculation or organized wall. Atrophic left kidney. Two adjacent distal left ureteral calculi measuring up to 6 mm with moderate left hydronephrosis. A 15 mm left renal calculus is also noted. Punctate nonobstructing right renal upper pole stone. No hydronephrosis. Thickened bladder wall. Correlation with urinalysis recommended to evaluate for cystitis. Extensive colonic diverticulosis. No evidence of bowel obstruction or active inflammation. She Electronically Signed   By: Anner Crete M.D.   On: 06/25/2016 02:39   Dg Chest 1 View  Result Date: 06/26/2016 CLINICAL DATA:  Shortness of breath. EXAM: CHEST 1 VIEW COMPARISON:  CT 06/25/2016 . FINDINGS: Mediastinum and hilar structures are normal. Elevation left hemidiaphragm noted. Hiatal hernia as noted on prior CT noted.  Mild basilar atelectasis on the left. Small left pleural effusion cannot be excluded. No pneumothorax. IMPRESSION: Elevation of the left hemidiaphragm. Hiatal hernia is noted on prior CT of 06/25/2016 noted. Mild basilar atelectasis on the left noted.Small left pleural effusion cannot be excluded. Electronically Signed   By: Marcello Moores  Register   On: 06/26/2016 10:43   Ct Chest Wo Contrast  Result Date: 06/25/2016 CLINICAL DATA:  82 year old male with sepsis and abdominal pain. History of prostate cancer. EXAM: CT CHEST, ABDOMEN AND PELVIS WITHOUT CONTRAST TECHNIQUE: Multidetector CT imaging of the chest, abdomen and pelvis was performed following the standard protocol without IV contrast. COMPARISON:  None. FINDINGS: Evaluation of this exam is limited in the absence of intravenous contrast. CT CHEST FINDINGS Cardiovascular: There is no cardiomegaly or pericardial effusion. Multi vessel coronary vascular calcification involving the LAD, left circumflex, and RCA. There is atherosclerotic calcification of the thoracic aorta. No aneurysmal dilatation. The central pulmonary arteries are grossly unremarkable on this noncontrast study. Mediastinum/Nodes: No hilar or mediastinal adenopathy. There is a moderate size hiatal hernia. The esophagus is grossly unremarkable. Subcentimeter right thyroid nodule. No mediastinal collection or hematoma. Lungs/Pleura: Bibasilar dependent atelectatic changes. Left lung base compressive atelectasis secondary to hiatal hernia. Pneumonia is less likely. There is no pleural effusion or pneumothorax. The central airways are patent. Musculoskeletal: No axillary adenopathy. The chest wall soft tissues appear unremarkable. There is osteopenia with degenerative changes of the spine. No acute fracture. CT ABDOMEN PELVIS FINDINGS No intra-abdominal free air. Small subhepatic and pericholecystic fluid. Hepatobiliary: The liver is grossly unremarkable. There multiple stones within the gallbladder  fundus. A stone is noted at the gallbladder neck. There is diffuse gallbladder wall thickening and edema. A 5.6 x 6.3 cm fluid collection noted in the right upper abdomen inferior to the gallbladder. This collection does not  demonstrate an organized wall or definite evidence of loculation. Pancreas: The pancreas is atrophic. No inflammatory changes of pancreas. Spleen: Normal in size without focal abnormality. Adrenals/Urinary Tract: The adrenal glands appear unremarkable. The left kidney is atrophic. There is a 15 mm stone in the left kidney. There are two adjacent stones in the distal left ureter measuring up to 6 mm. There is moderate left hydronephrosis. Punctate nonobstructing right renal upper pole calculus. There is no hydronephrosis on the right. The right ureter appears unremarkable. The urinary bladder is predominantly collapsed. There is apparent diffuse thickening of the bladder wall which may be partly related to underdistention. Cystitis is not excluded. Correlation with urinalysis recommended. Stomach/Bowel: There is extensive sigmoid diverticulosis without active inflammatory changes. Moderate size hiatal hernia noted. There is no evidence of bowel obstruction for active inflammation. The appendix appears unremarkable. Vascular/Lymphatic: There is moderate aortoiliac atherosclerotic disease. No aneurysmal dilatation. No portal venous gas identified. There is no adenopathy. Reproductive: The prostate and seminal vesicles are grossly unremarkable. Other: Small fat containing left inguinal hernia. No fluid collection. Musculoskeletal: Osteopenia.  No acute fracture. IMPRESSION: Cholelithiasis with findings most compatible with acute cholecystitis. A 4 mm stone at the neck of the gallbladder is concerning for an obstructing gallstone. Correlation with clinical exam recommended. Ultrasound may provide additional information. Small amount of fluid inferior to the gallbladder does not demonstrate loculation  or organized wall. Atrophic left kidney. Two adjacent distal left ureteral calculi measuring up to 6 mm with moderate left hydronephrosis. A 15 mm left renal calculus is also noted. Punctate nonobstructing right renal upper pole stone. No hydronephrosis. Thickened bladder wall. Correlation with urinalysis recommended to evaluate for cystitis. Extensive colonic diverticulosis. No evidence of bowel obstruction or active inflammation. She Electronically Signed   By: Anner Crete M.D.   On: 06/25/2016 02:39   Nm Hepatobiliary Liver Func  Result Date: 06/25/2016 CLINICAL DATA:  Abdominal pain 3 days. Cholelithiasis and acute cholecystitis. EXAM: NUCLEAR MEDICINE HEPATOBILIARY IMAGING TECHNIQUE: Sequential images of the abdomen were obtained out to 60 minutes following intravenous administration of radiopharmaceutical. RADIOPHARMACEUTICALS:  4.9 mCi Tc-47m  Choletec IV COMPARISON:  CT on 06/25/2016 FINDINGS: Prompt uptake and biliary excretion of activity by the liver is seen. Gallbladder activity is visualized, consistent with patency of cystic duct. Biliary activity passes into small bowel, consistent with patent common bile duct. IMPRESSION: Patency of both cystic and common bile ducts demonstrated (despite findings of acute cholecystitis on recent CT). See previous CT report. Electronically Signed   By: Earle Gell M.D.   On: 06/25/2016 15:00   Dg Chest Portable 1 View  Result Date: 06/25/2016 CLINICAL DATA:  82 year old male with acute respiratory distress. EXAM: PORTABLE CHEST 1 VIEW COMPARISON:  Chest radiograph dated 12/01/2010 and abdominal CT dated 07/12/2015 FINDINGS: Mild diffuse interstitial coarsening. No focal consolidation, pleural effusion, or pneumothorax. Moderate size hiatal hernia. Stable cardiac silhouette. No acute osseous pathology. IMPRESSION: No acute cardiopulmonary process. Moderate hiatal hernia. Electronically Signed   By: Anner Crete M.D.   On: 06/25/2016 00:50     Anti-infectives: Anti-infectives    Start     Dose/Rate Route Frequency Ordered Stop   06/25/16 0630  meropenem (MERREM) IVPB SOLR 500 mg     500 mg 100 mL/hr over 30 Minutes Intravenous Every 12 hours 06/25/16 0620     06/25/16 0145  ciprofloxacin (CIPRO) IVPB 400 mg     400 mg 200 mL/hr over 60 Minutes Intravenous  Once 06/25/16 0134 06/25/16 0413  06/25/16 0145  metroNIDAZOLE (FLAGYL) IVPB 500 mg     500 mg 100 mL/hr over 60 Minutes Intravenous  Once 06/25/16 0134 06/25/16 0413      Assessment/Plan:  82 year old male admitted with sepsis thought to be due to cholecystitis. No laboratory findings of cholecystitis and HIDA scan which shows filling the gallbladder. Difficult to ascertain whether not a percutaneous cholecystostomy tube is actually indicated or not at this point. Would continue IV antibiotics and follow labs. Surgery will continue to follow with you.  Rexine Gowens T. Adonis Huguenin, MD, FACS  06/26/2016

## 2016-06-26 NOTE — Progress Notes (Signed)
[place] [facility]                                                                                                                                                                                  Patient Demographics   [name], is a 82 y.o. male, DOB - 11/13/1926, FYB:[id number]  Admit date - 06/24/2016   Admitting Physician Harrie Foreman, MD  Outpatient Primary MD for the patient is Vista Mink, FNP   LOS - 1  Subjective: Patient confused today trying to get out of bed. Per nurse appears to be more tachypneic    Review of Systems:   CONSTITUTIONAL:Confused unable to provide  Vitals:   Vitals:   06/25/16 2057 06/26/16 0508 06/26/16 0831 06/26/16 1220  BP:  (!) 134/58 (!) 143/62 140/64  Pulse: (!) 108 71 89 88  Resp:   (!) 24 20  Temp:  99.5 F (37.5 C)  99 F (37.2 C)  TempSrc:  Oral  Oral  SpO2: 96% 97% 97% 97%  Weight:      Height:        Wt Readings from Last 3 Encounters:  06/24/16 167 lb (75.8 kg)  05/23/16 162 lb 9.4 oz (73.8 kg)  11/24/15 170 lb 1.4 oz (77.2 kg)     Intake/Output Summary (Last 24 hours) at 06/26/16 1516 Last data filed at 06/26/16 0430  Gross per 24 hour  Intake             1364 ml  Output              300 ml  Net             1064 ml    Physical Exam:   GENERAL: Pleasant-appearing in no apparent distress.  HEAD, EYES, EARS, NOSE AND THROAT: Atraumatic, normocephalic. Extraocular muscles are intact. Pupils equal and reactive to light. Sclerae anicteric. No conjunctival injection. No oro-pharyngeal erythema.  NECK: Supple. There is no jugular venous distention. No bruits, no lymphadenopathy, no thyromegaly.  HEART: irregular, irrrgular . No murmurs, no rubs, no clicks.  LUNGS: Rhonchus breath sounds No rales or. No wheezes.  ABDOMEN: Bilateral lower abdominal tenderness no guarding no rebound  EXTREMITIES: No evidence of any cyanosis, clubbing, or peripheral edema.  +2 pedal and  radial pulses bilaterally.  NEUROLOGIC: Confused SKIN: Moist and warm with no rashes appreciated.  Psych: Not anxious, depressed LN: No inguinal LN enlargement    Antibiotics   Anti-infectives    Start     Dose/Rate Route Frequency Ordered Stop   06/25/16 0630  meropenem (MERREM) IVPB SOLR 500 mg     500 mg 100 mL/hr  over 30 Minutes Intravenous Every 12 hours 06/25/16 0620     06/25/16 0145  ciprofloxacin (CIPRO) IVPB 400 mg     400 mg 200 mL/hr over 60 Minutes Intravenous  Once 06/25/16 0134 06/25/16 0413   06/25/16 0145  metroNIDAZOLE (FLAGYL) IVPB 500 mg     500 mg 100 mL/hr over 60 Minutes Intravenous  Once 06/25/16 0134 06/25/16 0413      Medications   Scheduled Meds: . aspirin EC  81 mg Oral QHS  . atorvastatin  40 mg Oral QHS  . cholecalciferol  5,000 Units Oral Daily  . docusate sodium  100 mg Oral BID  . doxazosin  4 mg Oral QHS  . ferrous sulfate  325 mg Oral BID  . heparin  5,000 Units Subcutaneous Q8H  . hydrALAZINE  25 mg Oral BID  . isosorbide mononitrate  30 mg Oral Daily  . levothyroxine  100 mcg Oral Daily  . meropenem  500 mg Intravenous Q12H  . metoprolol tartrate  50 mg Oral BID  . omega-3 acid ethyl esters  1 capsule Oral BID  . pantoprazole  40 mg Oral BID  . potassium chloride SA  20 mEq Oral BID  . sodium bicarbonate  1,300 mg Oral BID  . sodium chloride flush  3 mL Intravenous Q12H  . tamsulosin  0.4 mg Oral Daily  . torsemide  20 mg Oral BID   Continuous Infusions:  PRN Meds:.acetaminophen **OR** acetaminophen, albuterol, metoprolol, morphine injection, nitroGLYCERIN   Data Review:   Micro Results Recent Results (from the past 240 hour(s))  Blood culture (routine x 2)     Status: None (Preliminary result)   Collection Time: 06/25/16  2:08 AM  Result Value Ref Range Status   Specimen Description BLOOD LEFT ANTECUBITAL  Final   Special Requests   Final    BOTTLES DRAWN AEROBIC AND ANAEROBIC 10CCAERO,10CCANA   Culture NO GROWTH 1 DAY   Final   Report Status PENDING  Incomplete  Blood culture (routine x 2)     Status: None (Preliminary result)   Collection Time: 06/25/16  2:09 AM  Result Value Ref Range Status   Specimen Description BLOOD LEFT WRIST  Final   Special Requests BOTTLES DRAWN AEROBIC AND ANAEROBIC 2CCAERO,3CCANA  Final   Culture NO GROWTH 1 DAY  Final   Report Status PENDING  Incomplete    Radiology Reports Ct Abdomen Pelvis Wo Contrast  Result Date: 06/25/2016 CLINICAL DATA:  82 year old male with sepsis and abdominal pain. History of prostate cancer. EXAM: CT CHEST, ABDOMEN AND PELVIS WITHOUT CONTRAST TECHNIQUE: Multidetector CT imaging of the chest, abdomen and pelvis was performed following the standard protocol without IV contrast. COMPARISON:  None. FINDINGS: Evaluation of this exam is limited in the absence of intravenous contrast. CT CHEST FINDINGS Cardiovascular: There is no cardiomegaly or pericardial effusion. Multi vessel coronary vascular calcification involving the LAD, left circumflex, and RCA. There is atherosclerotic calcification of the thoracic aorta. No aneurysmal dilatation. The central pulmonary arteries are grossly unremarkable on this noncontrast study. Mediastinum/Nodes: No hilar or mediastinal adenopathy. There is a moderate size hiatal hernia. The esophagus is grossly unremarkable. Subcentimeter right thyroid nodule. No mediastinal collection or hematoma. Lungs/Pleura: Bibasilar dependent atelectatic changes. Left lung base compressive atelectasis secondary to hiatal hernia. Pneumonia is less likely. There is no pleural effusion or pneumothorax. The central airways are patent. Musculoskeletal: No axillary adenopathy. The chest wall soft tissues appear unremarkable. There is osteopenia with degenerative changes of the spine. No acute  fracture. CT ABDOMEN PELVIS FINDINGS No intra-abdominal free air. Small subhepatic and pericholecystic fluid. Hepatobiliary: The liver is grossly unremarkable.  There multiple stones within the gallbladder fundus. A stone is noted at the gallbladder neck. There is diffuse gallbladder wall thickening and edema. A 5.6 x 6.3 cm fluid collection noted in the right upper abdomen inferior to the gallbladder. This collection does not demonstrate an organized wall or definite evidence of loculation. Pancreas: The pancreas is atrophic. No inflammatory changes of pancreas. Spleen: Normal in size without focal abnormality. Adrenals/Urinary Tract: The adrenal glands appear unremarkable. The left kidney is atrophic. There is a 15 mm stone in the left kidney. There are two adjacent stones in the distal left ureter measuring up to 6 mm. There is moderate left hydronephrosis. Punctate nonobstructing right renal upper pole calculus. There is no hydronephrosis on the right. The right ureter appears unremarkable. The urinary bladder is predominantly collapsed. There is apparent diffuse thickening of the bladder wall which may be partly related to underdistention. Cystitis is not excluded. Correlation with urinalysis recommended. Stomach/Bowel: There is extensive sigmoid diverticulosis without active inflammatory changes. Moderate size hiatal hernia noted. There is no evidence of bowel obstruction for active inflammation. The appendix appears unremarkable. Vascular/Lymphatic: There is moderate aortoiliac atherosclerotic disease. No aneurysmal dilatation. No portal venous gas identified. There is no adenopathy. Reproductive: The prostate and seminal vesicles are grossly unremarkable. Other: Small fat containing left inguinal hernia. No fluid collection. Musculoskeletal: Osteopenia.  No acute fracture. IMPRESSION: Cholelithiasis with findings most compatible with acute cholecystitis. A 4 mm stone at the neck of the gallbladder is concerning for an obstructing gallstone. Correlation with clinical exam recommended. Ultrasound may provide additional information. Small amount of fluid inferior to  the gallbladder does not demonstrate loculation or organized wall. Atrophic left kidney. Two adjacent distal left ureteral calculi measuring up to 6 mm with moderate left hydronephrosis. A 15 mm left renal calculus is also noted. Punctate nonobstructing right renal upper pole stone. No hydronephrosis. Thickened bladder wall. Correlation with urinalysis recommended to evaluate for cystitis. Extensive colonic diverticulosis. No evidence of bowel obstruction or active inflammation. She Electronically Signed   By: Anner Crete M.D.   On: 06/25/2016 02:39   Dg Chest 1 View  Result Date: 06/26/2016 CLINICAL DATA:  Shortness of breath. EXAM: CHEST 1 VIEW COMPARISON:  CT 06/25/2016 . FINDINGS: Mediastinum and hilar structures are normal. Elevation left hemidiaphragm noted. Hiatal hernia as noted on prior CT noted. Mild basilar atelectasis on the left. Small left pleural effusion cannot be excluded. No pneumothorax. IMPRESSION: Elevation of the left hemidiaphragm. Hiatal hernia is noted on prior CT of 06/25/2016 noted. Mild basilar atelectasis on the left noted.Small left pleural effusion cannot be excluded. Electronically Signed   By: Marcello Moores  Register   On: 06/26/2016 10:43   Ct Chest Wo Contrast  Result Date: 06/25/2016 CLINICAL DATA:  82 year old male with sepsis and abdominal pain. History of prostate cancer. EXAM: CT CHEST, ABDOMEN AND PELVIS WITHOUT CONTRAST TECHNIQUE: Multidetector CT imaging of the chest, abdomen and pelvis was performed following the standard protocol without IV contrast. COMPARISON:  None. FINDINGS: Evaluation of this exam is limited in the absence of intravenous contrast. CT CHEST FINDINGS Cardiovascular: There is no cardiomegaly or pericardial effusion. Multi vessel coronary vascular calcification involving the LAD, left circumflex, and RCA. There is atherosclerotic calcification of the thoracic aorta. No aneurysmal dilatation. The central pulmonary arteries are grossly unremarkable  on this noncontrast study. Mediastinum/Nodes: No hilar or mediastinal adenopathy. There  is a moderate size hiatal hernia. The esophagus is grossly unremarkable. Subcentimeter right thyroid nodule. No mediastinal collection or hematoma. Lungs/Pleura: Bibasilar dependent atelectatic changes. Left lung base compressive atelectasis secondary to hiatal hernia. Pneumonia is less likely. There is no pleural effusion or pneumothorax. The central airways are patent. Musculoskeletal: No axillary adenopathy. The chest wall soft tissues appear unremarkable. There is osteopenia with degenerative changes of the spine. No acute fracture. CT ABDOMEN PELVIS FINDINGS No intra-abdominal free air. Small subhepatic and pericholecystic fluid. Hepatobiliary: The liver is grossly unremarkable. There multiple stones within the gallbladder fundus. A stone is noted at the gallbladder neck. There is diffuse gallbladder wall thickening and edema. A 5.6 x 6.3 cm fluid collection noted in the right upper abdomen inferior to the gallbladder. This collection does not demonstrate an organized wall or definite evidence of loculation. Pancreas: The pancreas is atrophic. No inflammatory changes of pancreas. Spleen: Normal in size without focal abnormality. Adrenals/Urinary Tract: The adrenal glands appear unremarkable. The left kidney is atrophic. There is a 15 mm stone in the left kidney. There are two adjacent stones in the distal left ureter measuring up to 6 mm. There is moderate left hydronephrosis. Punctate nonobstructing right renal upper pole calculus. There is no hydronephrosis on the right. The right ureter appears unremarkable. The urinary bladder is predominantly collapsed. There is apparent diffuse thickening of the bladder wall which may be partly related to underdistention. Cystitis is not excluded. Correlation with urinalysis recommended. Stomach/Bowel: There is extensive sigmoid diverticulosis without active inflammatory changes.  Moderate size hiatal hernia noted. There is no evidence of bowel obstruction for active inflammation. The appendix appears unremarkable. Vascular/Lymphatic: There is moderate aortoiliac atherosclerotic disease. No aneurysmal dilatation. No portal venous gas identified. There is no adenopathy. Reproductive: The prostate and seminal vesicles are grossly unremarkable. Other: Small fat containing left inguinal hernia. No fluid collection. Musculoskeletal: Osteopenia.  No acute fracture. IMPRESSION: Cholelithiasis with findings most compatible with acute cholecystitis. A 4 mm stone at the neck of the gallbladder is concerning for an obstructing gallstone. Correlation with clinical exam recommended. Ultrasound may provide additional information. Small amount of fluid inferior to the gallbladder does not demonstrate loculation or organized wall. Atrophic left kidney. Two adjacent distal left ureteral calculi measuring up to 6 mm with moderate left hydronephrosis. A 15 mm left renal calculus is also noted. Punctate nonobstructing right renal upper pole stone. No hydronephrosis. Thickened bladder wall. Correlation with urinalysis recommended to evaluate for cystitis. Extensive colonic diverticulosis. No evidence of bowel obstruction or active inflammation. She Electronically Signed   By: Anner Crete M.D.   On: 06/25/2016 02:39   Nm Hepatobiliary Liver Func  Result Date: 06/25/2016 CLINICAL DATA:  Abdominal pain 3 days. Cholelithiasis and acute cholecystitis. EXAM: NUCLEAR MEDICINE HEPATOBILIARY IMAGING TECHNIQUE: Sequential images of the abdomen were obtained out to 60 minutes following intravenous administration of radiopharmaceutical. RADIOPHARMACEUTICALS:  4.9 mCi Tc-80m  Choletec IV COMPARISON:  CT on 06/25/2016 FINDINGS: Prompt uptake and biliary excretion of activity by the liver is seen. Gallbladder activity is visualized, consistent with patency of cystic duct. Biliary activity passes into small bowel,  consistent with patent common bile duct. IMPRESSION: Patency of both cystic and common bile ducts demonstrated (despite findings of acute cholecystitis on recent CT). See previous CT report. Electronically Signed   By: Earle Gell M.D.   On: 06/25/2016 15:00   Dg Chest Portable 1 View  Result Date: 06/25/2016 CLINICAL DATA:  82 year old male with acute respiratory distress. EXAM: PORTABLE  CHEST 1 VIEW COMPARISON:  Chest radiograph dated 12/01/2010 and abdominal CT dated 07/12/2015 FINDINGS: Mild diffuse interstitial coarsening. No focal consolidation, pleural effusion, or pneumothorax. Moderate size hiatal hernia. Stable cardiac silhouette. No acute osseous pathology. IMPRESSION: No acute cardiopulmonary process. Moderate hiatal hernia. Electronically Signed   By: Anner Crete M.D.   On: 06/25/2016 00:50     CBC  Recent Labs Lab 06/25/16 0009 06/26/16 0449  WBC 5.7 5.8  HGB 13.4 11.2*  HCT 38.8* 32.6*  PLT 132* 114*  MCV 88.7 90.2  MCH 30.7 31.1  MCHC 34.6 34.4  RDW 14.8* 15.4*  LYMPHSABS 0.2*  --   MONOABS 0.5  --   EOSABS 0.0  --   BASOSABS 0.0  --     Chemistries   Recent Labs Lab 06/25/16 0009 06/26/16 0449  NA 133* 136  K 3.9 4.0  CL 103 109  CO2 20* 19*  GLUCOSE 101* 107*  BUN 35* 45*  CREATININE 3.10* 3.00*  CALCIUM 8.8* 8.0*  AST 26  --   ALT 15*  --   ALKPHOS 42  --   BILITOT 1.5*  --    ------------------------------------------------------------------------------------------------------------------ estimated creatinine clearance is 16.8 mL/min (by C-G formula based on SCr of 3 mg/dL (H)). ------------------------------------------------------------------------------------------------------------------  Recent Labs  06/25/16 0009  HGBA1C 5.4   ------------------------------------------------------------------------------------------------------------------ No results for input(s): CHOL, HDL, LDLCALC, TRIG, CHOLHDL, LDLDIRECT in the last 72  hours. ------------------------------------------------------------------------------------------------------------------  Recent Labs  06/25/16 0009  TSH 1.438   ------------------------------------------------------------------------------------------------------------------ No results for input(s): VITAMINB12, FOLATE, FERRITIN, TIBC, IRON, RETICCTPCT in the last 72 hours.  Coagulation profile  Recent Labs Lab 06/25/16 0009  INR 1.12    No results for input(s): DDIMER in the last 72 hours.  Cardiac Enzymes  Recent Labs Lab 06/25/16 0009  TROPONINI 0.04*   ------------------------------------------------------------------------------------------------------------------ Invalid input(s): POCBNP    Assessment & Plan   This is an 82 year old male admitted for sepsis secondary to cholecystitis. 1. Sepsis: Secondary to cholecystitis;  HIDA scan with no obstruction however clinically cholecystitis continue IV anabiotic 2. A. fib with RVR heart rate heart rate stable echo reviewed showed some diastolic dysfunction but no other abnormality 3. Chronic kidney disease: Stage IV; stable with IV hydration 4. CHF: Systolic; chronic, stable. hold  torsemide and  continue metoprolol per home regimen, chest x-ray ordered due to tachypnea 5. Hypertension: Controlled; continue hydralazine and losartan 6. Acute encephalopathy suspected due to acute delirium as result of hospitalization supportive care 7. Hypothyroidism: Continue Synthroid 8. Prostate cancer: Recent Lupron injection. Continue tamsulosin and Cardura for urinary retention 9. Hyperlipidemia: Continue statin therapy 10. DVT prophylaxis: Heparin 11. GI prophylaxis: Pantoprazole per home regimen     Code Status Orders        Start     Ordered   06/25/16 0616  Full code  Continuous     06/25/16 0615    Code Status History    Date Active Date Inactive Code Status Order ID Comments User Context   07/20/2015 12:00 PM  07/23/2015  3:48 PM Full Code [id number]  Epifanio Lesches, MD ED   06/03/2015  3:17 PM 06/07/2015 12:18 PM Full Code [id number]  Bettey Costa, MD Inpatient           Consults  Cardiology, surgery   DVT Prophylaxis  Lovenox    Lab Results  Component Value Date   PLT 114 (L) 06/26/2016     Time Spent in minutes   85min Greater than 50% of time spent in  care coordination and counseling patient regarding the condition and plan of care.   [name] M.D on 06/26/2016 at 3:16 PM  Between 7am to 6pm - Pager - [phone]  After 6pm go to [email or web address] - password [email or web address] [facility]   Office  [phone]

## 2016-06-26 NOTE — Progress Notes (Signed)
SUBJECTIVE: Patient is sleeping right now but had altered mental status last night was chronic confused. His IVs out.   Vitals:   06/25/16 1923 06/25/16 2057 06/26/16 0508 06/26/16 0831  BP: (!) 163/58  (!) 134/58 (!) 143/62  Pulse: 99 (!) 108 71 89  Resp: (!) 24   (!) 24  Temp: 98.6 F (37 C)  99.5 F (37.5 C)   TempSrc: Oral  Oral   SpO2: 95% 96% 97% 97%  Weight:      Height:        Intake/Output Summary (Last 24 hours) at 06/26/16 0833 Last data filed at 06/26/16 0430  Gross per 24 hour  Intake             2341 ml  Output              550 ml  Net             1791 ml    LABS: Basic Metabolic Panel:  Recent Labs  06/25/16 0009 06/26/16 0449  NA 133* 136  K 3.9 4.0  CL 103 109  CO2 20* 19*  GLUCOSE 101* 107*  BUN 35* 45*  CREATININE 3.10* 3.00*  CALCIUM 8.8* 8.0*   Liver Function Tests:  Recent Labs  06/25/16 0009  AST 26  ALT 15*  ALKPHOS 42  BILITOT 1.5*  PROT 6.3*  ALBUMIN 3.3*   No results for input(s): LIPASE, AMYLASE in the last 72 hours. CBC:  Recent Labs  06/25/16 0009 06/26/16 0449  WBC 5.7 5.8  NEUTROABS 5.0  --   HGB 13.4 11.2*  HCT 38.8* 32.6*  MCV 88.7 90.2  PLT 132* 114*   Cardiac Enzymes:  Recent Labs  06/25/16 0009  TROPONINI 0.04*   BNP: Invalid input(s): POCBNP D-Dimer: No results for input(s): DDIMER in the last 72 hours. Hemoglobin A1C:  Recent Labs  06/25/16 0009  HGBA1C 5.4   Fasting Lipid Panel: No results for input(s): CHOL, HDL, LDLCALC, TRIG, CHOLHDL, LDLDIRECT in the last 72 hours. Thyroid Function Tests:  Recent Labs  06/25/16 0009  TSH 1.438   Anemia Panel: No results for input(s): VITAMINB12, FOLATE, FERRITIN, TIBC, IRON, RETICCTPCT in the last 72 hours.   PHYSICAL EXAM General: Well developed, well nourished, in no acute distress HEENT:  Normocephalic and atramatic Neck:  No JVD.  Lungs: Clear bilaterally to auscultation and percussion. Heart: HRRR . Normal S1 and S2 without gallops  or murmurs.  Abdomen: Bowel sounds are positive, abdomen soft and non-tender  Msk:  Back normal, normal gait. Normal strength and tone for age. Extremities: No clubbing, cyanosis or edema.   Neuro: Alert and oriented X 3. Psych:  Good affect, responds appropriately  TELEMETRY: A. fib with controlled rate  ASSESSMENT AND PLAN: Atrial fibrillation with rapid ventricular response rate right now well controlled with cholecystitis and possible sepsis this is to be expected. I explained this to the patient wife.  Active Problems:   Hematuria   Sepsis ([place])    Dionisio David, MD, [facility] 06/26/2016 8:33 AM

## 2016-06-27 LAB — CBC
HEMATOCRIT: 31.2 % — AB (ref 40.0–52.0)
HEMOGLOBIN: 10.9 g/dL — AB (ref 13.0–18.0)
MCH: 31.3 pg (ref 26.0–34.0)
MCHC: 34.9 g/dL (ref 32.0–36.0)
MCV: 89.6 fL (ref 80.0–100.0)
Platelets: 92 10*3/uL — ABNORMAL LOW (ref 150–440)
RBC: 3.48 MIL/uL — AB (ref 4.40–5.90)
RDW: 15.1 % — ABNORMAL HIGH (ref 11.5–14.5)
WBC: 5.8 10*3/uL (ref 3.8–10.6)

## 2016-06-27 LAB — BASIC METABOLIC PANEL
Anion gap: 10 (ref 5–15)
BUN: 51 mg/dL — AB (ref 6–20)
CO2: 20 mmol/L — ABNORMAL LOW (ref 22–32)
Calcium: 8.3 mg/dL — ABNORMAL LOW (ref 8.9–10.3)
Chloride: 108 mmol/L (ref 101–111)
Creatinine, Ser: 3.41 mg/dL — ABNORMAL HIGH (ref 0.61–1.24)
GFR calc Af Amer: 17 mL/min — ABNORMAL LOW (ref >60)
GFR calc non Af Amer: 15 mL/min — ABNORMAL LOW (ref >60)
GLUCOSE: 63 mg/dL — AB (ref 65–99)
POTASSIUM: 3.5 mmol/L (ref 3.5–5.1)
Sodium: 138 mmol/L (ref 135–145)

## 2016-06-27 LAB — GLUCOSE, CAPILLARY
Glucose-Capillary: 60 mg/dL — ABNORMAL LOW (ref 65–99)
Glucose-Capillary: 63 mg/dL — ABNORMAL LOW (ref 65–99)
Glucose-Capillary: 67 mg/dL (ref 65–99)
Glucose-Capillary: 81 mg/dL (ref 65–99)
Glucose-Capillary: 119 mg/dL — ABNORMAL HIGH (ref 65–99)

## 2016-06-27 LAB — CORTISOL: CORTISOL PLASMA: 36.2 ug/dL

## 2016-06-27 MED ORDER — DEXTROSE-NACL 5-0.45 % IV SOLN
INTRAVENOUS | Status: DC
  Administered 2016-06-27 – 2016-06-29 (×3): via INTRAVENOUS

## 2016-06-27 MED ORDER — IPRATROPIUM-ALBUTEROL 0.5-2.5 (3) MG/3ML IN SOLN
3.0000 mL | Freq: Four times a day (QID) | RESPIRATORY_TRACT | Status: DC
  Administered 2016-06-27 – 2016-06-30 (×12): 3 mL via RESPIRATORY_TRACT
  Filled 2016-06-27 (×13): qty 3

## 2016-06-27 MED ORDER — GUAIFENESIN ER 600 MG PO TB12
600.0000 mg | ORAL_TABLET | Freq: Two times a day (BID) | ORAL | Status: DC
  Administered 2016-06-27 – 2016-06-28 (×3): 600 mg via ORAL
  Filled 2016-06-27 (×3): qty 1

## 2016-06-27 NOTE — Evaluation (Signed)
Physical Therapy Evaluation Patient Details Name: ZEYAD DELAGUILA MRN: [id number] DOB: 07/30/1926 Today's Date: 06/27/2016   History of Present Illness  Pt is admitted for acute choleysitis. Pt with complaints of abdominal pain and SOB x 3 days. Pt with history of CKD, prostate cancer, CHF, shingles, and HTN.  Clinical Impression  Pt is a pleasant 82 year old male who was admitted for acute choleysitis. Pt performs bed mobility with min assist and transfers/ambulation with cga and RW. All mobility performed while on room air with sats decreasing to 87%. O2 returned on patient with sats at 93%. Pt demonstrates deficits with strength/endurance/mobility/pain. Would benefit from skilled PT to address above deficits and promote optimal return to PLOF. Recommend transition to [facility] upon discharge from acute hospitalization.       Follow Up Recommendations Home health PT;Supervision/Assistance - 24 hour    Equipment Recommendations  None recommended by PT    Recommendations for Other Services       Precautions / Restrictions Precautions Precautions: Fall Restrictions Weight Bearing Restrictions: No      Mobility  Bed Mobility Overal bed mobility: Needs Assistance Bed Mobility: Supine to Sit     Supine to sit: Min assist     General bed mobility comments: assist for moving B LE off bed as well as trunk support coming to sit at EOB. Once seated at EOB, able to sit with supervision. Upright posture noted  Transfers Overall transfer level: Needs assistance Equipment used: Rolling walker (2 wheeled) Transfers: Sit to/from Stand Sit to Stand: Min guard         General transfer comment: Cues for pushing from seated surface prior to standing. Once standing, pt with upright posture. Used RW for assistance.  Ambulation/Gait Ambulation/Gait assistance: Min guard Ambulation Distance (Feet): 3 Feet Assistive device: Rolling walker (2 wheeled) Gait Pattern/deviations: Step-to  pattern     General Gait Details: short step length towards recliner with cues for safety. Pt performed all mobility on room air with sats decreasing to 87%. Cues for pursed lip breathing noted.   Stairs            Wheelchair Mobility    Modified Rankin (Stroke Patients Only)       Balance Overall balance assessment: Needs assistance Sitting-balance support: Feet supported Sitting balance-Leahy Scale: Good     Standing balance support: Bilateral upper extremity supported Standing balance-Leahy Scale: Good                               Pertinent Vitals/Pain Pain Assessment: Faces Faces Pain Scale: Hurts even more Pain Location: L lateral chest Pain Descriptors / Indicators: Dull;Discomfort Pain Intervention(s): Limited activity within patient's tolerance    Home Living Family/patient expects to be discharged to:: Private residence Living Arrangements: Spouse/significant other Available Help at Discharge: Family Type of Home: House Home Access: Stairs to enter;Ramped entrance Entrance Stairs-Rails: Right Entrance Stairs-Number of Steps: 3 Home Layout: One level Home Equipment: Walker - 2 wheels;Cane - single point      Prior Function Level of Independence: Independent         Comments: Per wife, pt independent prior to hospital stay. No falls.     Hand Dominance        Extremity/Trunk Assessment   Upper Extremity Assessment Upper Extremity Assessment: Generalized weakness (B UE grossly 4/5)    Lower Extremity Assessment Lower Extremity Assessment: Generalized weakness (B LE grossly 4/5)  Communication   Communication: HOH  Cognition Arousal/Alertness: Awake/alert Behavior During Therapy: Flat affect Overall Cognitive Status: History of cognitive impairments - at baseline                      General Comments      Exercises Other Exercises Other Exercises: Pt performed seated ther-ex in recliner on B LE  including 10 reps of quad sets, hip abd/add, and SLRs. Cues for sequencing given. Min assist required. All ther-ex performed on room air, no SOB symptoms noted.   Assessment/Plan    PT Assessment Patient needs continued PT services  PT Problem List Decreased strength;Decreased activity tolerance;Decreased balance;Decreased mobility;Pain          PT Treatment Interventions DME instruction;Gait training;Stair training;Therapeutic exercise    PT Goals (Current goals can be found in the Care Plan section)  Acute Rehab PT Goals Patient Stated Goal: get stronger PT Goal Formulation: With patient Time For Goal Achievement: 07/11/16 Potential to Achieve Goals: Good    Frequency Min 2X/week   Barriers to discharge        Co-evaluation               End of Session Equipment Utilized During Treatment: Gait belt;Oxygen Activity Tolerance: Patient tolerated treatment well Patient left: in chair;with chair alarm set;with family/visitor present Nurse Communication: Mobility status         Time: 6226-3335 PT Time Calculation (min) (ACUTE ONLY): 20 min   Charges:   PT Evaluation $PT Eval Low Complexity: 1 Procedure PT Treatments $Therapeutic Exercise: 8-22 mins   PT G Codes:        Riggs Dineen 07-13-2016, 2:28 PM  Greggory Stallion, PT, DPT [phone]

## 2016-06-27 NOTE — Progress Notes (Signed)
Notified Dr. Posey Pronto that patient had low BP per MD do not give hydralazine, other BP meds okay.

## 2016-06-27 NOTE — Progress Notes (Signed)
Dr. Ara Kussmaul notified of BP 113/50 upon recheck and 3 hypertensive meds due: Lopressor, Cardura and Hydralazine; Order to hold meds. Barbaraann Faster, RN 10:01 PM 06/27/2016

## 2016-06-27 NOTE — Progress Notes (Signed)
[place] at [facility]                                                                                                                                                                                  Patient Demographics   [name], is a 82 y.o. male, DOB - Dec 05, 1926, SEG:[id number]  Admit date - 06/24/2016   Admitting Physician Harrie Foreman, MD  Outpatient Primary MD for the patient is Vista Mink, FNP   LOS - 2  Subjective: Patient feeling better abdominal pain is improved. He is having some cough and congestion    Review of Systems:   CONSTITUTIONAL: No documented fever. No fatigue, weakness. No weight gain, no weight loss.  EYES: No blurry or double vision.  ENT: No tinnitus. No postnasal drip. No redness of the oropharynx.  RESPIRATORY: Positive cough, no wheeze, no hemoptysis. No dyspnea.  CARDIOVASCULAR: No chest pain. No orthopnea. No palpitations. No syncope.  GASTROINTESTINAL: No nausea, no vomiting or diarrhea. No abdominal pain. No melena or hematochezia.  GENITOURINARY:  No urgency. No frequency. No dysuria. No hematuria. No obstructive symptoms. No discharge. No pain. No significant abnormal bleeding ENDOCRINE: No polyuria or nocturia. No heat or cold intolerance.  HEMATOLOGY: No anemia. No bruising. No bleeding. No purpura. No petechiae INTEGUMENTARY: No rashes. No lesions.  MUSCULOSKELETAL: No arthritis. No swelling. No gout.  NEUROLOGIC: No numbness, tingling, or ataxia. No seizure-type activity.  PSYCHIATRIC: No anxiety. No insomnia. No ADD.     Vitals:   Vitals:   06/27/16 0950 06/27/16 1031 06/27/16 1200 06/27/16 1415  BP: (!) 122/52  121/66   Pulse:  93 90   Resp:   19   Temp:   98 F (36.7 C)   TempSrc:   Oral   SpO2:   98% 93%  Weight:      Height:        Wt Readings from Last 3 Encounters:  06/24/16 167 lb (75.8 kg)  05/23/16 162 lb 9.4 oz (73.8 kg)  11/24/15 170 lb 1.4 oz (77.2 kg)      Intake/Output Summary (Last 24 hours) at 06/27/16 1513 Last data filed at 06/27/16 1202  Gross per 24 hour  Intake              306 ml  Output              750 ml  Net             -444 ml    Physical Exam:   GENERAL: Pleasant-appearing in no apparent distress.  HEAD, EYES, EARS, NOSE AND THROAT: Atraumatic, normocephalic. Extraocular muscles are intact. Pupils equal and  reactive to light. Sclerae anicteric. No conjunctival injection. No oro-pharyngeal erythema.  NECK: Supple. There is no jugular venous distention. No bruits, no lymphadenopathy, no thyromegaly.  HEART: irregular, irrrgular . No murmurs, no rubs, no clicks.  LUNGS: Rhonchus breath sounds No rales or. No wheezes.  ABDOMEN: Abdominal pain improved , + bs  EXTREMITIES: No evidence of any cyanosis, clubbing, or peripheral edema.  +2 pedal and radial pulses bilaterally.  NEUROLOGIC: Confused SKIN: Moist and warm with no rashes appreciated.  Psych: Not anxious, depressed LN: No inguinal LN enlargement    Antibiotics   Anti-infectives    Start     Dose/Rate Route Frequency Ordered Stop   06/25/16 0630  meropenem (MERREM) IVPB SOLR 500 mg     500 mg 100 mL/hr over 30 Minutes Intravenous Every 12 hours 06/25/16 0620     06/25/16 0145  ciprofloxacin (CIPRO) IVPB 400 mg     400 mg 200 mL/hr over 60 Minutes Intravenous  Once 06/25/16 0134 06/25/16 0413   06/25/16 0145  metroNIDAZOLE (FLAGYL) IVPB 500 mg     500 mg 100 mL/hr over 60 Minutes Intravenous  Once 06/25/16 0134 06/25/16 0413      Medications   Scheduled Meds: . aspirin EC  81 mg Oral QHS  . atorvastatin  40 mg Oral QHS  . cholecalciferol  5,000 Units Oral Daily  . docusate sodium  100 mg Oral BID  . doxazosin  4 mg Oral QHS  . ferrous sulfate  325 mg Oral BID  . guaiFENesin  600 mg Oral BID  . heparin  5,000 Units Subcutaneous Q8H  . hydrALAZINE  25 mg Oral BID  . ipratropium-albuterol  3 mL Nebulization Q6H  . isosorbide mononitrate  30 mg  Oral Daily  . levothyroxine  100 mcg Oral Daily  . meropenem  500 mg Intravenous Q12H  . metoprolol tartrate  50 mg Oral BID  . omega-3 acid ethyl esters  1 capsule Oral BID  . pantoprazole  40 mg Oral BID  . potassium chloride SA  20 mEq Oral BID  . sodium bicarbonate  1,300 mg Oral BID  . sodium chloride flush  3 mL Intravenous Q12H  . tamsulosin  0.4 mg Oral Daily   Continuous Infusions: . dextrose 5 % and 0.45% NaCl 50 mL/hr at 06/27/16 0906   PRN Meds:.acetaminophen **OR** acetaminophen, albuterol, metoprolol, morphine injection, nitroGLYCERIN   Data Review:   Micro Results Recent Results (from the past 240 hour(s))  Blood culture (routine x 2)     Status: None (Preliminary result)   Collection Time: 06/25/16  2:08 AM  Result Value Ref Range Status   Specimen Description BLOOD LEFT ANTECUBITAL  Final   Special Requests   Final    BOTTLES DRAWN AEROBIC AND ANAEROBIC 10CCAERO,10CCANA   Culture NO GROWTH 2 DAYS  Final   Report Status PENDING  Incomplete  Blood culture (routine x 2)     Status: None (Preliminary result)   Collection Time: 06/25/16  2:09 AM  Result Value Ref Range Status   Specimen Description BLOOD LEFT WRIST  Final   Special Requests BOTTLES DRAWN AEROBIC AND ANAEROBIC 2CCAERO,3CCANA  Final   Culture NO GROWTH 2 DAYS  Final   Report Status PENDING  Incomplete    Radiology Reports Ct Abdomen Pelvis Wo Contrast  Result Date: 06/25/2016 CLINICAL DATA:  82 year old male with sepsis and abdominal pain. History of prostate cancer. EXAM: CT CHEST, ABDOMEN AND PELVIS WITHOUT CONTRAST TECHNIQUE: Multidetector CT imaging of  the chest, abdomen and pelvis was performed following the standard protocol without IV contrast. COMPARISON:  None. FINDINGS: Evaluation of this exam is limited in the absence of intravenous contrast. CT CHEST FINDINGS Cardiovascular: There is no cardiomegaly or pericardial effusion. Multi vessel coronary vascular calcification involving the LAD,  left circumflex, and RCA. There is atherosclerotic calcification of the thoracic aorta. No aneurysmal dilatation. The central pulmonary arteries are grossly unremarkable on this noncontrast study. Mediastinum/Nodes: No hilar or mediastinal adenopathy. There is a moderate size hiatal hernia. The esophagus is grossly unremarkable. Subcentimeter right thyroid nodule. No mediastinal collection or hematoma. Lungs/Pleura: Bibasilar dependent atelectatic changes. Left lung base compressive atelectasis secondary to hiatal hernia. Pneumonia is less likely. There is no pleural effusion or pneumothorax. The central airways are patent. Musculoskeletal: No axillary adenopathy. The chest wall soft tissues appear unremarkable. There is osteopenia with degenerative changes of the spine. No acute fracture. CT ABDOMEN PELVIS FINDINGS No intra-abdominal free air. Small subhepatic and pericholecystic fluid. Hepatobiliary: The liver is grossly unremarkable. There multiple stones within the gallbladder fundus. A stone is noted at the gallbladder neck. There is diffuse gallbladder wall thickening and edema. A 5.6 x 6.3 cm fluid collection noted in the right upper abdomen inferior to the gallbladder. This collection does not demonstrate an organized wall or definite evidence of loculation. Pancreas: The pancreas is atrophic. No inflammatory changes of pancreas. Spleen: Normal in size without focal abnormality. Adrenals/Urinary Tract: The adrenal glands appear unremarkable. The left kidney is atrophic. There is a 15 mm stone in the left kidney. There are two adjacent stones in the distal left ureter measuring up to 6 mm. There is moderate left hydronephrosis. Punctate nonobstructing right renal upper pole calculus. There is no hydronephrosis on the right. The right ureter appears unremarkable. The urinary bladder is predominantly collapsed. There is apparent diffuse thickening of the bladder wall which may be partly related to  underdistention. Cystitis is not excluded. Correlation with urinalysis recommended. Stomach/Bowel: There is extensive sigmoid diverticulosis without active inflammatory changes. Moderate size hiatal hernia noted. There is no evidence of bowel obstruction for active inflammation. The appendix appears unremarkable. Vascular/Lymphatic: There is moderate aortoiliac atherosclerotic disease. No aneurysmal dilatation. No portal venous gas identified. There is no adenopathy. Reproductive: The prostate and seminal vesicles are grossly unremarkable. Other: Small fat containing left inguinal hernia. No fluid collection. Musculoskeletal: Osteopenia.  No acute fracture. IMPRESSION: Cholelithiasis with findings most compatible with acute cholecystitis. A 4 mm stone at the neck of the gallbladder is concerning for an obstructing gallstone. Correlation with clinical exam recommended. Ultrasound may provide additional information. Small amount of fluid inferior to the gallbladder does not demonstrate loculation or organized wall. Atrophic left kidney. Two adjacent distal left ureteral calculi measuring up to 6 mm with moderate left hydronephrosis. A 15 mm left renal calculus is also noted. Punctate nonobstructing right renal upper pole stone. No hydronephrosis. Thickened bladder wall. Correlation with urinalysis recommended to evaluate for cystitis. Extensive colonic diverticulosis. No evidence of bowel obstruction or active inflammation. She Electronically Signed   By: Anner Crete M.D.   On: 06/25/2016 02:39   Dg Chest 1 View  Result Date: 06/26/2016 CLINICAL DATA:  Shortness of breath. EXAM: CHEST 1 VIEW COMPARISON:  CT 06/25/2016 . FINDINGS: Mediastinum and hilar structures are normal. Elevation left hemidiaphragm noted. Hiatal hernia as noted on prior CT noted. Mild basilar atelectasis on the left. Small left pleural effusion cannot be excluded. No pneumothorax. IMPRESSION: Elevation of the left hemidiaphragm. Hiatal  hernia is noted on prior CT of 06/25/2016 noted. Mild basilar atelectasis on the left noted.Small left pleural effusion cannot be excluded. Electronically Signed   By: Marcello Moores  Register   On: 06/26/2016 10:43   Ct Chest Wo Contrast  Result Date: 06/25/2016 CLINICAL DATA:  82 year old male with sepsis and abdominal pain. History of prostate cancer. EXAM: CT CHEST, ABDOMEN AND PELVIS WITHOUT CONTRAST TECHNIQUE: Multidetector CT imaging of the chest, abdomen and pelvis was performed following the standard protocol without IV contrast. COMPARISON:  None. FINDINGS: Evaluation of this exam is limited in the absence of intravenous contrast. CT CHEST FINDINGS Cardiovascular: There is no cardiomegaly or pericardial effusion. Multi vessel coronary vascular calcification involving the LAD, left circumflex, and RCA. There is atherosclerotic calcification of the thoracic aorta. No aneurysmal dilatation. The central pulmonary arteries are grossly unremarkable on this noncontrast study. Mediastinum/Nodes: No hilar or mediastinal adenopathy. There is a moderate size hiatal hernia. The esophagus is grossly unremarkable. Subcentimeter right thyroid nodule. No mediastinal collection or hematoma. Lungs/Pleura: Bibasilar dependent atelectatic changes. Left lung base compressive atelectasis secondary to hiatal hernia. Pneumonia is less likely. There is no pleural effusion or pneumothorax. The central airways are patent. Musculoskeletal: No axillary adenopathy. The chest wall soft tissues appear unremarkable. There is osteopenia with degenerative changes of the spine. No acute fracture. CT ABDOMEN PELVIS FINDINGS No intra-abdominal free air. Small subhepatic and pericholecystic fluid. Hepatobiliary: The liver is grossly unremarkable. There multiple stones within the gallbladder fundus. A stone is noted at the gallbladder neck. There is diffuse gallbladder wall thickening and edema. A 5.6 x 6.3 cm fluid collection noted in the right  upper abdomen inferior to the gallbladder. This collection does not demonstrate an organized wall or definite evidence of loculation. Pancreas: The pancreas is atrophic. No inflammatory changes of pancreas. Spleen: Normal in size without focal abnormality. Adrenals/Urinary Tract: The adrenal glands appear unremarkable. The left kidney is atrophic. There is a 15 mm stone in the left kidney. There are two adjacent stones in the distal left ureter measuring up to 6 mm. There is moderate left hydronephrosis. Punctate nonobstructing right renal upper pole calculus. There is no hydronephrosis on the right. The right ureter appears unremarkable. The urinary bladder is predominantly collapsed. There is apparent diffuse thickening of the bladder wall which may be partly related to underdistention. Cystitis is not excluded. Correlation with urinalysis recommended. Stomach/Bowel: There is extensive sigmoid diverticulosis without active inflammatory changes. Moderate size hiatal hernia noted. There is no evidence of bowel obstruction for active inflammation. The appendix appears unremarkable. Vascular/Lymphatic: There is moderate aortoiliac atherosclerotic disease. No aneurysmal dilatation. No portal venous gas identified. There is no adenopathy. Reproductive: The prostate and seminal vesicles are grossly unremarkable. Other: Small fat containing left inguinal hernia. No fluid collection. Musculoskeletal: Osteopenia.  No acute fracture. IMPRESSION: Cholelithiasis with findings most compatible with acute cholecystitis. A 4 mm stone at the neck of the gallbladder is concerning for an obstructing gallstone. Correlation with clinical exam recommended. Ultrasound may provide additional information. Small amount of fluid inferior to the gallbladder does not demonstrate loculation or organized wall. Atrophic left kidney. Two adjacent distal left ureteral calculi measuring up to 6 mm with moderate left hydronephrosis. A 15 mm left  renal calculus is also noted. Punctate nonobstructing right renal upper pole stone. No hydronephrosis. Thickened bladder wall. Correlation with urinalysis recommended to evaluate for cystitis. Extensive colonic diverticulosis. No evidence of bowel obstruction or active inflammation. She Electronically Signed   By: Laren Everts.D.  On: 06/25/2016 02:39   Nm Hepatobiliary Liver Func  Result Date: 06/25/2016 CLINICAL DATA:  Abdominal pain 3 days. Cholelithiasis and acute cholecystitis. EXAM: NUCLEAR MEDICINE HEPATOBILIARY IMAGING TECHNIQUE: Sequential images of the abdomen were obtained out to 60 minutes following intravenous administration of radiopharmaceutical. RADIOPHARMACEUTICALS:  4.9 mCi Tc-67m  Choletec IV COMPARISON:  CT on 06/25/2016 FINDINGS: Prompt uptake and biliary excretion of activity by the liver is seen. Gallbladder activity is visualized, consistent with patency of cystic duct. Biliary activity passes into small bowel, consistent with patent common bile duct. IMPRESSION: Patency of both cystic and common bile ducts demonstrated (despite findings of acute cholecystitis on recent CT). See previous CT report. Electronically Signed   By: Earle Gell M.D.   On: 06/25/2016 15:00   Dg Chest Portable 1 View  Result Date: 06/25/2016 CLINICAL DATA:  82 year old male with acute respiratory distress. EXAM: PORTABLE CHEST 1 VIEW COMPARISON:  Chest radiograph dated 12/01/2010 and abdominal CT dated 07/12/2015 FINDINGS: Mild diffuse interstitial coarsening. No focal consolidation, pleural effusion, or pneumothorax. Moderate size hiatal hernia. Stable cardiac silhouette. No acute osseous pathology. IMPRESSION: No acute cardiopulmonary process. Moderate hiatal hernia. Electronically Signed   By: Anner Crete M.D.   On: 06/25/2016 00:50     CBC  Recent Labs Lab 06/25/16 0009 06/26/16 0449 06/27/16 0512  WBC 5.7 5.8 5.8  HGB 13.4 11.2* 10.9*  HCT 38.8* 32.6* 31.2*  PLT 132* 114* 92*   MCV 88.7 90.2 89.6  MCH 30.7 31.1 31.3  MCHC 34.6 34.4 34.9  RDW 14.8* 15.4* 15.1*  LYMPHSABS 0.2*  --   --   MONOABS 0.5  --   --   EOSABS 0.0  --   --   BASOSABS 0.0  --   --     Chemistries   Recent Labs Lab 06/25/16 0009 06/26/16 0449 06/27/16 0512  NA 133* 136 138  K 3.9 4.0 3.5  CL 103 109 108  CO2 20* 19* 20*  GLUCOSE 101* 107* 63*  BUN 35* 45* 51*  CREATININE 3.10* 3.00* 3.41*  CALCIUM 8.8* 8.0* 8.3*  AST 26  --   --   ALT 15*  --   --   ALKPHOS 42  --   --   BILITOT 1.5*  --   --    ------------------------------------------------------------------------------------------------------------------ estimated creatinine clearance is 14.8 mL/min (by C-G formula based on SCr of 3.41 mg/dL (H)). ------------------------------------------------------------------------------------------------------------------  Recent Labs  06/25/16 0009  HGBA1C 5.4   ------------------------------------------------------------------------------------------------------------------ No results for input(s): CHOL, HDL, LDLCALC, TRIG, CHOLHDL, LDLDIRECT in the last 72 hours. ------------------------------------------------------------------------------------------------------------------  Recent Labs  06/25/16 0009  TSH 1.438   ------------------------------------------------------------------------------------------------------------------ No results for input(s): VITAMINB12, FOLATE, FERRITIN, TIBC, IRON, RETICCTPCT in the last 72 hours.  Coagulation profile  Recent Labs Lab 06/25/16 0009  INR 1.12    No results for input(s): DDIMER in the last 72 hours.  Cardiac Enzymes  Recent Labs Lab 06/25/16 0009  TROPONINI 0.04*   ------------------------------------------------------------------------------------------------------------------ Invalid input(s): POCBNP    Assessment & Plan   This is an 82 year old male admitted for sepsis secondary to cholecystitis. 1.  Sepsis:  Possible Secondary to cholecystitis;  HIDA scan with no obstruction however clinically cholecystitis continue IV anabiotic Start clear liquid diet 2. A. fib with RVR heart rate heart rate stable echo reviewed showed some diastolic dysfunction hr stable continue metoprolol  3. Chronic kidney disease: Stage IV;  4. CHF: Systolic; chronic, stable. hold  torsemide and  continue metoprolol per home regimen, cxr without  chf or pna 5. Hypertension: Controlled; continue hydralazine and losartan 6. Acute encephalopathy suspected due to acute delirium as result of hospitalization supportive care now improved 7. Hypothyroidism: Continue Synthroid 8. Prostate cancer: Recent Lupron injection. Continue tamsulosin and Cardura for urinary retention 9. Hyperlipidemia: Continue statin therapy 10. DVT prophylaxis: Heparin 11. GI prophylaxis: Pantoprazole per home regimen     Code Status Orders        Start     Ordered   06/25/16 0616  Full code  Continuous     06/25/16 0615    Code Status History    Date Active Date Inactive Code Status Order ID Comments User Context   07/20/2015 12:00 PM 07/23/2015  3:48 PM Full Code [id number]  Epifanio Lesches, MD ED   06/03/2015  3:17 PM 06/07/2015 12:18 PM Full Code [id number]  Bettey Costa, MD Inpatient           Consults  Cardiology, surgery   DVT Prophylaxis  Lovenox    Lab Results  Component Value Date   PLT 92 (L) 06/27/2016     Time Spent in minutes   23min Greater than 50% of time spent in care coordination and counseling patient regarding the condition and plan of care.   [name] M.D on 06/27/2016 at 3:13 PM  Between 7am to 6pm - Pager - [phone]  After 6pm go to [email or web address] - password [email or web address] [facility] [facility]   Office  [phone]

## 2016-06-27 NOTE — Progress Notes (Signed)
CC: Abdominal pain Subjective: Patient reports that his abdominal pain has improved. Also reports a better night. Currently denies any nausea or vomiting.  Objective: Vital signs in last 24 hours: Temp:  [98 F (36.7 C)-99.1 F (37.3 C)] 98 F (36.7 C) (12/20 1200) Pulse Rate:  [61-93] 90 (12/20 1200) Resp:  [19-20] 19 (12/20 1200) BP: (103-132)/(42-73) 121/66 (12/20 1200) SpO2:  [93 %-98 %] 98 % (12/20 1200) Last BM Date: 06/25/16  Intake/Output from previous day: 12/19 0701 - 12/20 0700 In: 0  Out: 550 [Urine:550] Intake/Output this shift: Total I/O In: 306 [P.O.:120; I.V.:186] Out: 200 [Urine:200]  Physical exam:  Gen.: No acute distress Chest: Clear to auscultation Heart: Regular rate and rhythm Abdomen: Large, soft, minimally tender to deep palpation in the upper abdomen but with a negative Murphy sign.  Lab Results: CBC   Recent Labs  06/26/16 0449 06/27/16 0512  WBC 5.8 5.8  HGB 11.2* 10.9*  HCT 32.6* 31.2*  PLT 114* 92*   BMET  Recent Labs  06/26/16 0449 06/27/16 0512  NA 136 138  K 4.0 3.5  CL 109 108  CO2 19* 20*  GLUCOSE 107* 63*  BUN 45* 51*  CREATININE 3.00* 3.41*  CALCIUM 8.0* 8.3*   PT/INR  Recent Labs  06/25/16 0009  LABPROT 14.5  INR 1.12   ABG  Recent Labs  06/26/16 1145  PHART 7.38  HCO3 18.9*    Studies/Results: Dg Chest 1 View  Result Date: 06/26/2016 CLINICAL DATA:  Shortness of breath. EXAM: CHEST 1 VIEW COMPARISON:  CT 06/25/2016 . FINDINGS: Mediastinum and hilar structures are normal. Elevation left hemidiaphragm noted. Hiatal hernia as noted on prior CT noted. Mild basilar atelectasis on the left. Small left pleural effusion cannot be excluded. No pneumothorax. IMPRESSION: Elevation of the left hemidiaphragm. Hiatal hernia is noted on prior CT of 06/25/2016 noted. Mild basilar atelectasis on the left noted.Small left pleural effusion cannot be excluded. Electronically Signed   By: Marcello Moores  Register   On:  06/26/2016 10:43   Nm Hepatobiliary Liver Func  Result Date: 06/25/2016 CLINICAL DATA:  Abdominal pain 3 days. Cholelithiasis and acute cholecystitis. EXAM: NUCLEAR MEDICINE HEPATOBILIARY IMAGING TECHNIQUE: Sequential images of the abdomen were obtained out to 60 minutes following intravenous administration of radiopharmaceutical. RADIOPHARMACEUTICALS:  4.9 mCi Tc-71m  Choletec IV COMPARISON:  CT on 06/25/2016 FINDINGS: Prompt uptake and biliary excretion of activity by the liver is seen. Gallbladder activity is visualized, consistent with patency of cystic duct. Biliary activity passes into small bowel, consistent with patent common bile duct. IMPRESSION: Patency of both cystic and common bile ducts demonstrated (despite findings of acute cholecystitis on recent CT). See previous CT report. Electronically Signed   By: Earle Gell M.D.   On: 06/25/2016 15:00    Anti-infectives: Anti-infectives    Start     Dose/Rate Route Frequency Ordered Stop   06/25/16 0630  meropenem (MERREM) IVPB SOLR 500 mg     500 mg 100 mL/hr over 30 Minutes Intravenous Every 12 hours 06/25/16 0620     06/25/16 0145  ciprofloxacin (CIPRO) IVPB 400 mg     400 mg 200 mL/hr over 60 Minutes Intravenous  Once 06/25/16 0134 06/25/16 0413   06/25/16 0145  metroNIDAZOLE (FLAGYL) IVPB 500 mg     500 mg 100 mL/hr over 60 Minutes Intravenous  Once 06/25/16 0134 06/25/16 0413      Assessment/Plan:  82 year old male with multiple medical problems admitted with what was thought cholecystitis. Clinically doing much better  from the abdominal pain standpoint. Currently no plans for any surgical intervention. Recommend continued IV antibiotics and supportive care for all of his other medical problems. Should be okay to transition to oral antibiotics. Surgery will continue to follow.  Laticia Vannostrand T. Adonis Huguenin, MD, FACS  06/27/2016

## 2016-06-27 NOTE — Progress Notes (Signed)
ANTIBIOTIC CONSULT NOTE - INITIAL  Pharmacy Consult for Meropenem  Indication: gall bladder infection  Allergies  Allergen Reactions  . Ativan [Lorazepam] Other (See Comments)    Hallucinations, combative  . Penicillin G Other (See Comments)    Has patient had a PCN reaction causing immediate rash, facial/tongue/throat swelling, SOB or lightheadedness with hypotension: Yes Has patient had a PCN reaction causing severe rash involving mucus membranes or skin necrosis: No Has patient had a PCN reaction that required hospitalization No Has patient had a PCN reaction occurring within the last 10 years: No If all of the above answers are "NO", then may proceed with Cephalosporin use.     Patient Measurements: Height: 5\' 8"  (172.7 cm) Weight: 167 lb (75.8 kg) IBW/kg (Calculated) : 68.4 Adjusted Body Weight:   Vital Signs: Temp: 98 F (36.7 C) (12/20 1200) Temp Source: Oral (12/20 1200) BP: 121/66 (12/20 1200) Pulse Rate: 90 (12/20 1200) Intake/Output from previous day: 12/19 0701 - 12/20 0700 In: 0  Out: 550 [Urine:550] Intake/Output from this shift: Total I/O In: 306 [P.O.:120; I.V.:186] Out: 200 [Urine:200]  Labs:  Recent Labs  06/25/16 0009 06/26/16 0449 06/27/16 0512  WBC 5.7 5.8 5.8  HGB 13.4 11.2* 10.9*  PLT 132* 114* 92*  CREATININE 3.10* 3.00* 3.41*   Estimated Creatinine Clearance: 14.8 mL/min (by C-G formula based on SCr of 3.41 mg/dL (H)). No results for input(s): VANCOTROUGH, VANCOPEAK, VANCORANDOM, GENTTROUGH, GENTPEAK, GENTRANDOM, TOBRATROUGH, TOBRAPEAK, TOBRARND, AMIKACINPEAK, AMIKACINTROU, AMIKACIN in the last 72 hours.   Microbiology: Recent Results (from the past 720 hour(s))  Blood culture (routine x 2)     Status: None (Preliminary result)   Collection Time: 06/25/16  2:08 AM  Result Value Ref Range Status   Specimen Description BLOOD LEFT ANTECUBITAL  Final   Special Requests   Final    BOTTLES DRAWN AEROBIC AND ANAEROBIC 10CCAERO,10CCANA   Culture NO GROWTH 2 DAYS  Final   Report Status PENDING  Incomplete  Blood culture (routine x 2)     Status: None (Preliminary result)   Collection Time: 06/25/16  2:09 AM  Result Value Ref Range Status   Specimen Description BLOOD LEFT WRIST  Final   Special Requests BOTTLES DRAWN AEROBIC AND ANAEROBIC 2CCAERO,3CCANA  Final   Culture NO GROWTH 2 DAYS  Final   Report Status PENDING  Incomplete    Medical History: Past Medical History:  Diagnosis Date  . Cardiac disease   . CHF (congestive heart failure) ([place])   . Hypercholesteremia   . Hypertension   . Kidney failure    left  . Osteoporosis   . Prostate cancer ([place])   . Shingles   . Skin cancer    right cheek    Medications:  Prescriptions Prior to Admission  Medication Sig Dispense Refill Last Dose  . albuterol (PROVENTIL HFA;VENTOLIN HFA) 108 (90 BASE) MCG/ACT inhaler Inhale 2 puffs into the lungs every 6 (six) hours as needed for wheezing or shortness of breath.   prn at prn  . aspirin EC 81 MG tablet Take 81 mg by mouth at bedtime.   06/24/2016 at Unknown time  . atorvastatin (LIPITOR) 40 MG tablet Take 40 mg by mouth at bedtime.    06/24/2016 at Unknown time  . Cholecalciferol (VITAMIN D3) 5000 units TABS Take 5,000 Units by mouth daily.   06/24/2016 at Unknown time  . doxazosin (CARDURA) 4 MG tablet Take 4 mg by mouth at bedtime.    06/24/2016 at Unknown time  . ferrous  sulfate 325 (65 FE) MG tablet Take 325 mg by mouth 2 (two) times daily.    06/24/2016 at Unknown time  . hydrALAZINE (APRESOLINE) 25 MG tablet Take 1 tablet (25 mg total) by mouth every 8 (eight) hours. (Patient taking differently: Take 25 mg by mouth 2 (two) times daily. ) 90 tablet 6 06/24/2016 at 1900  . isosorbide mononitrate (IMDUR) 30 MG 24 hr tablet Take 1 tablet (30 mg total) by mouth daily. 30 tablet 6 06/24/2016 at Unknown time  . levothyroxine (SYNTHROID, LEVOTHROID) 100 MCG tablet Take 100 mcg by mouth daily.   06/24/2016 at Unknown time  .  losartan (COZAAR) 100 MG tablet Take 100 mg by mouth daily. Reported on 11/24/2015   06/24/2016 at Unknown time  . metoprolol succinate (TOPROL-XL) 50 MG 24 hr tablet Take 50 mg by mouth daily.    06/24/2016 at 1200  . nitroGLYCERIN (NITROSTAT) 0.4 MG SL tablet Place 0.4 mg under the tongue every 5 (five) minutes as needed for chest pain.   prn at prn  . omega-3 acid ethyl esters (LOVAZA) 1 G capsule Take 1 capsule by mouth 2 (two) times daily.   06/24/2016 at Unknown time  . pantoprazole (PROTONIX) 40 MG tablet Take 40 mg by mouth 2 (two) times daily.    06/24/2016 at Unknown time  . potassium chloride SA (K-DUR,KLOR-CON) 20 MEQ tablet Take 20 mEq by mouth 2 (two) times daily.    06/24/2016 at Unknown time  . sodium bicarbonate 650 MG tablet Take 1,300 mg by mouth 2 (two) times daily.    06/24/2016 at Unknown time  . tamsulosin (FLOMAX) 0.4 MG CAPS capsule Take 0.4 mg by mouth daily.   06/24/2016 at Unknown time  . torsemide (DEMADEX) 20 MG tablet Take 20 mg by mouth 2 (two) times daily.    06/24/2016 at Unknown time   Assessment: CrCl = 16.2 ml/min   Goal of Therapy:  resolution of infection  Plan:  Continue meropenem 500 mg IV Q12H.    Rexene Edison, PharmD, BCPS Clinical Pharmacist  06/27/2016 1:21 PM

## 2016-06-27 NOTE — Progress Notes (Signed)
SUBJECTIVE: Patient is more alert today no chest pain or shortness of breath   Vitals:   06/27/16 0806 06/27/16 0943 06/27/16 0950 06/27/16 1031  BP: (!) 103/55 (!) 113/42 (!) 122/52   Pulse: 91 61  93  Resp: 20     Temp: 98.2 F (36.8 C)     TempSrc: Oral     SpO2: 93%     Weight:      Height:        Intake/Output Summary (Last 24 hours) at 06/27/16 1111 Last data filed at 06/27/16 1044  Gross per 24 hour  Intake              120 ml  Output              750 ml  Net             -630 ml    LABS: Basic Metabolic Panel:  Recent Labs  06/26/16 0449 06/27/16 0512  NA 136 138  K 4.0 3.5  CL 109 108  CO2 19* 20*  GLUCOSE 107* 63*  BUN 45* 51*  CREATININE 3.00* 3.41*  CALCIUM 8.0* 8.3*   Liver Function Tests:  Recent Labs  06/25/16 0009  AST 26  ALT 15*  ALKPHOS 42  BILITOT 1.5*  PROT 6.3*  ALBUMIN 3.3*   No results for input(s): LIPASE, AMYLASE in the last 72 hours. CBC:  Recent Labs  06/25/16 0009 06/26/16 0449 06/27/16 0512  WBC 5.7 5.8 5.8  NEUTROABS 5.0  --   --   HGB 13.4 11.2* 10.9*  HCT 38.8* 32.6* 31.2*  MCV 88.7 90.2 89.6  PLT 132* 114* 92*   Cardiac Enzymes:  Recent Labs  06/25/16 0009  TROPONINI 0.04*   BNP: Invalid input(s): POCBNP D-Dimer: No results for input(s): DDIMER in the last 72 hours. Hemoglobin A1C:  Recent Labs  06/25/16 0009  HGBA1C 5.4   Fasting Lipid Panel: No results for input(s): CHOL, HDL, LDLCALC, TRIG, CHOLHDL, LDLDIRECT in the last 72 hours. Thyroid Function Tests:  Recent Labs  06/25/16 0009  TSH 1.438   Anemia Panel: No results for input(s): VITAMINB12, FOLATE, FERRITIN, TIBC, IRON, RETICCTPCT in the last 72 hours.   PHYSICAL EXAM General: Well developed, well nourished, in no acute distress HEENT:  Normocephalic and atramatic Neck:  No JVD.  Lungs: Clear bilaterally to auscultation and percussion. Heart: HRRR . Normal S1 and S2 without gallops or murmurs.  Abdomen: Bowel sounds are  positive, abdomen soft and non-tender  Msk:  Back normal, normal gait. Normal strength and tone for age. Extremities: No clubbing, cyanosis or edema.   Neuro: Alert and oriented X 3. Psych:  Good affect, responds appropriately  TELEMETRY:Atrial fibrillation with controlled ventricular rate about 83/m  ASSESSMENT AND PLAN: Atrial fibrillation with controlled ventricular rate and hypotension due to sepsis and cholecystitis. With antibiotic patient is gradually getting better.  Active Problems:   Hematuria   Sepsis ([place])    Abrahan Fulmore A, MD, [facility] 06/27/2016 11:11 AM

## 2016-06-27 NOTE — Progress Notes (Addendum)
Per Dr. Posey Pronto okay for clear liquid diet and incentive spirometer, and discontinue telemetry

## 2016-06-27 NOTE — Progress Notes (Signed)
Per Dr. Posey Pronto okay to give patient juice for a sugar of 60. Will recheck

## 2016-06-28 ENCOUNTER — Inpatient Hospital Stay: Payer: Medicare Other

## 2016-06-28 LAB — BASIC METABOLIC PANEL
Anion gap: 9 (ref 5–15)
BUN: 50 mg/dL — AB (ref 6–20)
CHLORIDE: 107 mmol/L (ref 101–111)
CO2: 21 mmol/L — AB (ref 22–32)
CREATININE: 3.21 mg/dL — AB (ref 0.61–1.24)
Calcium: 8.1 mg/dL — ABNORMAL LOW (ref 8.9–10.3)
GFR calc Af Amer: 19 mL/min — ABNORMAL LOW (ref >60)
GFR calc non Af Amer: 16 mL/min — ABNORMAL LOW (ref >60)
Glucose, Bld: 163 mg/dL — ABNORMAL HIGH (ref 65–99)
Potassium: 3.6 mmol/L (ref 3.5–5.1)
SODIUM: 137 mmol/L (ref 135–145)

## 2016-06-28 LAB — CBC
HCT: 31.9 % — ABNORMAL LOW (ref 40.0–52.0)
HEMOGLOBIN: 11 g/dL — AB (ref 13.0–18.0)
MCH: 30.3 pg (ref 26.0–34.0)
MCHC: 34.5 g/dL (ref 32.0–36.0)
MCV: 87.9 fL (ref 80.0–100.0)
Platelets: 79 10*3/uL — ABNORMAL LOW (ref 150–440)
RBC: 3.63 MIL/uL — ABNORMAL LOW (ref 4.40–5.90)
RDW: 15.4 % — ABNORMAL HIGH (ref 11.5–14.5)
WBC: 6.1 10*3/uL (ref 3.8–10.6)

## 2016-06-28 LAB — GLUCOSE, CAPILLARY: Glucose-Capillary: 177 mg/dL — ABNORMAL HIGH (ref 65–99)

## 2016-06-28 MED ORDER — GUAIFENESIN ER 600 MG PO TB12
600.0000 mg | ORAL_TABLET | Freq: Two times a day (BID) | ORAL | Status: DC
  Administered 2016-06-28 – 2016-06-30 (×4): 600 mg via ORAL
  Filled 2016-06-28 (×4): qty 1

## 2016-06-28 MED ORDER — ONDANSETRON HCL 4 MG/2ML IJ SOLN
4.0000 mg | Freq: Three times a day (TID) | INTRAMUSCULAR | Status: DC | PRN
  Administered 2016-06-28: 4 mg via INTRAVENOUS

## 2016-06-28 MED ORDER — ISOSORBIDE MONONITRATE ER 30 MG PO TB24: 30.0000 mg | ORAL_TABLET | Freq: Every day | ORAL | Status: DC

## 2016-06-28 MED ORDER — METOPROLOL TARTRATE 25 MG PO TABS
25.0000 mg | ORAL_TABLET | Freq: Two times a day (BID) | ORAL | Status: DC
  Administered 2016-06-28 – 2016-06-30 (×4): 25 mg via ORAL
  Filled 2016-06-28 (×4): qty 1

## 2016-06-28 MED ORDER — GUAIFENESIN ER 600 MG PO TB12: 600.0000 mg | ORAL_TABLET | Freq: Two times a day (BID) | ORAL | Status: DC

## 2016-06-28 MED ORDER — ONDANSETRON HCL 4 MG/2ML IJ SOLN
4.0000 mg | Freq: Three times a day (TID) | INTRAMUSCULAR | Status: DC | PRN
  Filled 2016-06-28: qty 2

## 2016-06-28 NOTE — Progress Notes (Signed)
[place] at [facility]                                                                                                                                                                                  Patient Demographics   Arnett Duddy, is a 82 y.o. male, DOB - [birth year], HBZ:[id number]  Admit date - 06/24/2016   Admitting Physician Harrie Foreman, MD  Outpatient Primary MD for the patient is Vista Mink, FNP   LOS - 3  Subjective: Patient has abdominal pain and distention.  He also has some cough and SOB, on O2 [place] 2L.  Review of Systems:   CONSTITUTIONAL: No documented fever. Has weakness. No weight gain, no weight loss.  EYES: No blurry or double vision.  ENT: No tinnitus. No postnasal drip. No redness of the oropharynx.  RESPIRATORY: Positive cough, no wheeze, no hemoptysis. Has dyspnea.  CARDIOVASCULAR: No chest pain. No orthopnea. No palpitations. No syncope.  GASTROINTESTINAL: No nausea, no vomiting or diarrhea. Has abdominal pain and distention. No melena or hematochezia. No bowel movement. GENITOURINARY:  No urgency. No frequency. No dysuria. No hematuria. No obstructive symptoms. No discharge. No pain. No significant abnormal bleeding ENDOCRINE: No polyuria or nocturia. No heat or cold intolerance.  HEMATOLOGY: No anemia. No bruising. No bleeding. No purpura. No petechiae INTEGUMENTARY: No rashes. No lesions.  MUSCULOSKELETAL: No arthritis. No swelling. No gout.  NEUROLOGIC: No numbness, tingling, or ataxia. No seizure-type activity.  PSYCHIATRIC: No anxiety. No insomnia. No ADD.     Vitals:   Vitals:   06/28/16 0742 06/28/16 0833 06/28/16 1226 06/28/16 1321  BP:  101/83 108/78   Pulse:  (!) 109 90   Resp:  18 18   Temp:  98.8 F (37.1 C) 98.2 F (36.8 C)   TempSrc:  Oral Oral   SpO2: 94% 97% 97% 95%  Weight:      Height:        Wt Readings from Last 3 Encounters:  06/24/16 167 lb (75.8 kg)  05/23/16 162 lb 9.4 oz  (73.8 kg)  11/24/15 170 lb 1.4 oz (77.2 kg)     Intake/Output Summary (Last 24 hours) at 06/28/16 1804 Last data filed at 06/28/16 1749  Gross per 24 hour  Intake             2229 ml  Output              900 ml  Net             1329 ml    Physical Exam:   GENERAL: Pleasant-appearing in no apparent distress.  HEAD, EYES, EARS, NOSE AND THROAT: Atraumatic, normocephalic.  Extraocular muscles are intact. Pupils equal and reactive to light. Sclerae anicteric. No conjunctival injection. No oro-pharyngeal erythema.  NECK: Supple. There is no jugular venous distention. No bruits, no lymphadenopathy, no thyromegaly.  HEART: irregular, irrrgular . No murmurs, no rubs, no clicks.  LUNGS: Rhonchus breath sounds No rales or. No wheezes.  ABDOMEN: Abdominal distension and tenderness, bowel sounds presents, no rigidity or rebound. EXTREMITIES: No evidence of any cyanosis, clubbing, or peripheral edema.  +2 pedal and radial pulses bilaterally.  NEUROLOGIC: AAO 3, but looks confused. SKIN: Moist and warm with no rashes appreciated.  Psych: Not anxious, depressed LN: No inguinal LN enlargement    Antibiotics   Anti-infectives    Start     Dose/Rate Route Frequency Ordered Stop   06/25/16 0630  meropenem (MERREM) IVPB SOLR 500 mg     500 mg 100 mL/hr over 30 Minutes Intravenous Every 12 hours 06/25/16 0620     06/25/16 0145  ciprofloxacin (CIPRO) IVPB 400 mg     400 mg 200 mL/hr over 60 Minutes Intravenous  Once 06/25/16 0134 06/25/16 0413   06/25/16 0145  metroNIDAZOLE (FLAGYL) IVPB 500 mg     500 mg 100 mL/hr over 60 Minutes Intravenous  Once 06/25/16 0134 06/25/16 0413      Medications   Scheduled Meds: . aspirin EC  81 mg Oral QHS  . atorvastatin  40 mg Oral QHS  . cholecalciferol  5,000 Units Oral Daily  . docusate sodium  100 mg Oral BID  . doxazosin  4 mg Oral QHS  . ferrous sulfate  325 mg Oral BID  . guaiFENesin  600 mg Oral BID  . hydrALAZINE  25 mg Oral BID  .  ipratropium-albuterol  3 mL Nebulization Q6H  . [START ON 06/29/2016] isosorbide mononitrate  30 mg Oral Daily  . levothyroxine  100 mcg Oral Daily  . meropenem  500 mg Intravenous Q12H  . metoprolol tartrate  25 mg Oral BID  . omega-3 acid ethyl esters  1 capsule Oral BID  . pantoprazole  40 mg Oral BID  . potassium chloride SA  20 mEq Oral BID  . sodium bicarbonate  1,300 mg Oral BID  . sodium chloride flush  3 mL Intravenous Q12H  . tamsulosin  0.4 mg Oral Daily   Continuous Infusions: . dextrose 5 % and 0.45% NaCl 50 mL/hr at 06/28/16 0630   PRN Meds:.acetaminophen **OR** acetaminophen, albuterol, metoprolol, nitroGLYCERIN, ondansetron (ZOFRAN) IV   Data Review:   Micro Results Recent Results (from the past 240 hour(s))  Blood culture (routine x 2)     Status: None (Preliminary result)   Collection Time: 06/25/16  2:08 AM  Result Value Ref Range Status   Specimen Description BLOOD LEFT ANTECUBITAL  Final   Special Requests   Final    BOTTLES DRAWN AEROBIC AND ANAEROBIC 10CCAERO,10CCANA   Culture NO GROWTH 3 DAYS  Final   Report Status PENDING  Incomplete  Blood culture (routine x 2)     Status: None (Preliminary result)   Collection Time: 06/25/16  2:09 AM  Result Value Ref Range Status   Specimen Description BLOOD LEFT WRIST  Final   Special Requests BOTTLES DRAWN AEROBIC AND ANAEROBIC 2CCAERO,3CCANA  Final   Culture NO GROWTH 3 DAYS  Final   Report Status PENDING  Incomplete    Radiology Reports Ct Abdomen Pelvis Wo Contrast  Result Date: 06/25/2016 CLINICAL DATA:  82 year old male with sepsis and abdominal pain. History of prostate cancer. EXAM: CT  CHEST, ABDOMEN AND PELVIS WITHOUT CONTRAST TECHNIQUE: Multidetector CT imaging of the chest, abdomen and pelvis was performed following the standard protocol without IV contrast. COMPARISON:  None. FINDINGS: Evaluation of this exam is limited in the absence of intravenous contrast. CT CHEST FINDINGS Cardiovascular: There  is no cardiomegaly or pericardial effusion. Multi vessel coronary vascular calcification involving the LAD, left circumflex, and RCA. There is atherosclerotic calcification of the thoracic aorta. No aneurysmal dilatation. The central pulmonary arteries are grossly unremarkable on this noncontrast study. Mediastinum/Nodes: No hilar or mediastinal adenopathy. There is a moderate size hiatal hernia. The esophagus is grossly unremarkable. Subcentimeter right thyroid nodule. No mediastinal collection or hematoma. Lungs/Pleura: Bibasilar dependent atelectatic changes. Left lung base compressive atelectasis secondary to hiatal hernia. Pneumonia is less likely. There is no pleural effusion or pneumothorax. The central airways are patent. Musculoskeletal: No axillary adenopathy. The chest wall soft tissues appear unremarkable. There is osteopenia with degenerative changes of the spine. No acute fracture. CT ABDOMEN PELVIS FINDINGS No intra-abdominal free air. Small subhepatic and pericholecystic fluid. Hepatobiliary: The liver is grossly unremarkable. There multiple stones within the gallbladder fundus. A stone is noted at the gallbladder neck. There is diffuse gallbladder wall thickening and edema. A 5.6 x 6.3 cm fluid collection noted in the right upper abdomen inferior to the gallbladder. This collection does not demonstrate an organized wall or definite evidence of loculation. Pancreas: The pancreas is atrophic. No inflammatory changes of pancreas. Spleen: Normal in size without focal abnormality. Adrenals/Urinary Tract: The adrenal glands appear unremarkable. The left kidney is atrophic. There is a 15 mm stone in the left kidney. There are two adjacent stones in the distal left ureter measuring up to 6 mm. There is moderate left hydronephrosis. Punctate nonobstructing right renal upper pole calculus. There is no hydronephrosis on the right. The right ureter appears unremarkable. The urinary bladder is predominantly  collapsed. There is apparent diffuse thickening of the bladder wall which may be partly related to underdistention. Cystitis is not excluded. Correlation with urinalysis recommended. Stomach/Bowel: There is extensive sigmoid diverticulosis without active inflammatory changes. Moderate size hiatal hernia noted. There is no evidence of bowel obstruction for active inflammation. The appendix appears unremarkable. Vascular/Lymphatic: There is moderate aortoiliac atherosclerotic disease. No aneurysmal dilatation. No portal venous gas identified. There is no adenopathy. Reproductive: The prostate and seminal vesicles are grossly unremarkable. Other: Small fat containing left inguinal hernia. No fluid collection. Musculoskeletal: Osteopenia.  No acute fracture. IMPRESSION: Cholelithiasis with findings most compatible with acute cholecystitis. A 4 mm stone at the neck of the gallbladder is concerning for an obstructing gallstone. Correlation with clinical exam recommended. Ultrasound may provide additional information. Small amount of fluid inferior to the gallbladder does not demonstrate loculation or organized wall. Atrophic left kidney. Two adjacent distal left ureteral calculi measuring up to 6 mm with moderate left hydronephrosis. A 15 mm left renal calculus is also noted. Punctate nonobstructing right renal upper pole stone. No hydronephrosis. Thickened bladder wall. Correlation with urinalysis recommended to evaluate for cystitis. Extensive colonic diverticulosis. No evidence of bowel obstruction or active inflammation. She Electronically Signed   By: Anner Crete M.D.   On: 06/25/2016 02:39   Dg Chest 1 View  Result Date: 06/26/2016 CLINICAL DATA:  Shortness of breath. EXAM: CHEST 1 VIEW COMPARISON:  CT 06/25/2016 . FINDINGS: Mediastinum and hilar structures are normal. Elevation left hemidiaphragm noted. Hiatal hernia as noted on prior CT noted. Mild basilar atelectasis on the left. Small left pleural  effusion cannot  be excluded. No pneumothorax. IMPRESSION: Elevation of the left hemidiaphragm. Hiatal hernia is noted on prior CT of 06/25/2016 noted. Mild basilar atelectasis on the left noted.Small left pleural effusion cannot be excluded. Electronically Signed   By: Marcello Moores  Register   On: 06/26/2016 10:43   Dg Chest 2 View  Result Date: 06/28/2016 CLINICAL DATA:  Abdomen pain patient feels bloated and sick EXAM: CHEST  2 VIEW COMPARISON:  06/26/2016 FINDINGS: Persistent elevation of the left diaphragm. Small left effusion and left basilar atelectasis or infiltrate. Stable cardiomegaly. Large hiatal hernia. No pneumothorax. IMPRESSION: 1. No significant interval change in small left pleural effusion and left basilar atelectasis or infiltrate 2. Large hiatal hernia 3. Cardiomegaly Electronically Signed   By: Donavan Foil M.D.   On: 06/28/2016 14:08   Ct Chest Wo Contrast  Result Date: 06/25/2016 CLINICAL DATA:  82 year old male with sepsis and abdominal pain. History of prostate cancer. EXAM: CT CHEST, ABDOMEN AND PELVIS WITHOUT CONTRAST TECHNIQUE: Multidetector CT imaging of the chest, abdomen and pelvis was performed following the standard protocol without IV contrast. COMPARISON:  None. FINDINGS: Evaluation of this exam is limited in the absence of intravenous contrast. CT CHEST FINDINGS Cardiovascular: There is no cardiomegaly or pericardial effusion. Multi vessel coronary vascular calcification involving the LAD, left circumflex, and RCA. There is atherosclerotic calcification of the thoracic aorta. No aneurysmal dilatation. The central pulmonary arteries are grossly unremarkable on this noncontrast study. Mediastinum/Nodes: No hilar or mediastinal adenopathy. There is a moderate size hiatal hernia. The esophagus is grossly unremarkable. Subcentimeter right thyroid nodule. No mediastinal collection or hematoma. Lungs/Pleura: Bibasilar dependent atelectatic changes. Left lung base compressive  atelectasis secondary to hiatal hernia. Pneumonia is less likely. There is no pleural effusion or pneumothorax. The central airways are patent. Musculoskeletal: No axillary adenopathy. The chest wall soft tissues appear unremarkable. There is osteopenia with degenerative changes of the spine. No acute fracture. CT ABDOMEN PELVIS FINDINGS No intra-abdominal free air. Small subhepatic and pericholecystic fluid. Hepatobiliary: The liver is grossly unremarkable. There multiple stones within the gallbladder fundus. A stone is noted at the gallbladder neck. There is diffuse gallbladder wall thickening and edema. A 5.6 x 6.3 cm fluid collection noted in the right upper abdomen inferior to the gallbladder. This collection does not demonstrate an organized wall or definite evidence of loculation. Pancreas: The pancreas is atrophic. No inflammatory changes of pancreas. Spleen: Normal in size without focal abnormality. Adrenals/Urinary Tract: The adrenal glands appear unremarkable. The left kidney is atrophic. There is a 15 mm stone in the left kidney. There are two adjacent stones in the distal left ureter measuring up to 6 mm. There is moderate left hydronephrosis. Punctate nonobstructing right renal upper pole calculus. There is no hydronephrosis on the right. The right ureter appears unremarkable. The urinary bladder is predominantly collapsed. There is apparent diffuse thickening of the bladder wall which may be partly related to underdistention. Cystitis is not excluded. Correlation with urinalysis recommended. Stomach/Bowel: There is extensive sigmoid diverticulosis without active inflammatory changes. Moderate size hiatal hernia noted. There is no evidence of bowel obstruction for active inflammation. The appendix appears unremarkable. Vascular/Lymphatic: There is moderate aortoiliac atherosclerotic disease. No aneurysmal dilatation. No portal venous gas identified. There is no adenopathy. Reproductive: The prostate  and seminal vesicles are grossly unremarkable. Other: Small fat containing left inguinal hernia. No fluid collection. Musculoskeletal: Osteopenia.  No acute fracture. IMPRESSION: Cholelithiasis with findings most compatible with acute cholecystitis. A 4 mm stone at the neck of the gallbladder  is concerning for an obstructing gallstone. Correlation with clinical exam recommended. Ultrasound may provide additional information. Small amount of fluid inferior to the gallbladder does not demonstrate loculation or organized wall. Atrophic left kidney. Two adjacent distal left ureteral calculi measuring up to 6 mm with moderate left hydronephrosis. A 15 mm left renal calculus is also noted. Punctate nonobstructing right renal upper pole stone. No hydronephrosis. Thickened bladder wall. Correlation with urinalysis recommended to evaluate for cystitis. Extensive colonic diverticulosis. No evidence of bowel obstruction or active inflammation. She Electronically Signed   By: Anner Crete M.D.   On: 06/25/2016 02:39   Nm Hepatobiliary Liver Func  Result Date: 06/25/2016 CLINICAL DATA:  Abdominal pain 3 days. Cholelithiasis and acute cholecystitis. EXAM: NUCLEAR MEDICINE HEPATOBILIARY IMAGING TECHNIQUE: Sequential images of the abdomen were obtained out to 60 minutes following intravenous administration of radiopharmaceutical. RADIOPHARMACEUTICALS:  4.9 mCi Tc-68m  Choletec IV COMPARISON:  CT on 06/25/2016 FINDINGS: Prompt uptake and biliary excretion of activity by the liver is seen. Gallbladder activity is visualized, consistent with patency of cystic duct. Biliary activity passes into small bowel, consistent with patent common bile duct. IMPRESSION: Patency of both cystic and common bile ducts demonstrated (despite findings of acute cholecystitis on recent CT). See previous CT report. Electronically Signed   By: Earle Gell M.D.   On: 06/25/2016 15:00   Dg Chest Portable 1 View  Result Date: 06/25/2016 CLINICAL  DATA:  82 year old male with acute respiratory distress. EXAM: PORTABLE CHEST 1 VIEW COMPARISON:  Chest radiograph dated 12/01/2010 and abdominal CT dated 07/12/2015 FINDINGS: Mild diffuse interstitial coarsening. No focal consolidation, pleural effusion, or pneumothorax. Moderate size hiatal hernia. Stable cardiac silhouette. No acute osseous pathology. IMPRESSION: No acute cardiopulmonary process. Moderate hiatal hernia. Electronically Signed   By: Anner Crete M.D.   On: 06/25/2016 00:50   Dg Abd 2 Views  Result Date: 06/28/2016 CLINICAL DATA:  Diffuse abdominal pain EXAM: ABDOMEN - 2 VIEW COMPARISON:  06/25/2016 FINDINGS: Scattered large and small bowel gas is noted. Diffuse dilatation of multiple small bowel loops is seen. No free air is noted. A paucity of colonic gas is seen. These changes are consistent with a at least partial small bowel obstruction. No acute bony abnormality is seen. IMPRESSION: Changes consistent with at least a partial small bowel obstruction. No free air is noted. These results will be called to the ordering clinician or representative by the Radiologist Assistant, and communication documented in the PACS or zVision Dashboard. Electronically Signed   By: Inez Catalina M.D.   On: 06/28/2016 14:06     CBC  Recent Labs Lab 06/25/16 0009 06/26/16 0449 06/27/16 0512 06/28/16 0533  WBC 5.7 5.8 5.8 6.1  HGB 13.4 11.2* 10.9* 11.0*  HCT 38.8* 32.6* 31.2* 31.9*  PLT 132* 114* 92* 79*  MCV 88.7 90.2 89.6 87.9  MCH 30.7 31.1 31.3 30.3  MCHC 34.6 34.4 34.9 34.5  RDW 14.8* 15.4* 15.1* 15.4*  LYMPHSABS 0.2*  --   --   --   MONOABS 0.5  --   --   --   EOSABS 0.0  --   --   --   BASOSABS 0.0  --   --   --     Chemistries   Recent Labs Lab 06/25/16 0009 06/26/16 0449 06/27/16 0512 06/28/16 0533  NA 133* 136 138 137  K 3.9 4.0 3.5 3.6  CL 103 109 108 107  CO2 20* 19* 20* 21*  GLUCOSE 101* 107* 63* 163*  BUN 35* 45* 51* 50*  CREATININE 3.10* 3.00* 3.41* 3.21*   CALCIUM 8.8* 8.0* 8.3* 8.1*  AST 26  --   --   --   ALT 15*  --   --   --   ALKPHOS 42  --   --   --   BILITOT 1.5*  --   --   --    ------------------------------------------------------------------------------------------------------------------ estimated creatinine clearance is 15.7 mL/min (by C-G formula based on SCr of 3.21 mg/dL (H)). ------------------------------------------------------------------------------------------------------------------ No results for input(s): HGBA1C in the last 72 hours. ------------------------------------------------------------------------------------------------------------------ No results for input(s): CHOL, HDL, LDLCALC, TRIG, CHOLHDL, LDLDIRECT in the last 72 hours. ------------------------------------------------------------------------------------------------------------------ No results for input(s): TSH, T4TOTAL, T3FREE, THYROIDAB in the last 72 hours.  Invalid input(s): FREET3 ------------------------------------------------------------------------------------------------------------------ No results for input(s): VITAMINB12, FOLATE, FERRITIN, TIBC, IRON, RETICCTPCT in the last 72 hours.  Coagulation profile  Recent Labs Lab 06/25/16 0009  INR 1.12    No results for input(s): DDIMER in the last 72 hours.  Cardiac Enzymes  Recent Labs Lab 06/25/16 0009  TROPONINI 0.04*   ------------------------------------------------------------------------------------------------------------------ Invalid input(s): POCBNP    Assessment & Plan   This is an 82 year old male admitted for sepsis secondary to cholecystitis. 1. Sepsis:  Possible Secondary to cholecystitis;  HIDA scan with no obstruction however clinically cholecystitis continue IV anabiotic He was on clear liquid diet, but changed to nothing by mouth except medication due to partial bowel obstruction.  * Partial small bowel obstruction, per xray.  Keep nothing by mouth  except medication and water, IV fluid support. Follow-up surgeon.  2. A. fib with RVR heart rate heart rate,  stable echo reviewed showed some diastolic dysfunction hr stable continue metoprolol, lower the dose due to low blood pressure. Continue aspirin.  3. Chronic kidney disease: Stage IV;  4. CHF: Systolic; chronic, stable. hold  torsemide and  continue metoprolol per home regimen, cxr without chf or pna 5. Hypertension: hold hydralazine and imdur due to low blood pressure. 6. Acute encephalopathy suspected due to acute delirium as result of hospitalization supportive care now improved 7. Hypothyroidism: Continue Synthroid 8. Prostate cancer: Recent Lupron injection. Continue tamsulosin and Cardura for urinary retention 9. Hyperlipidemia: Continue statin therapy 10. DVT prophylaxis: Heparin 11. GI prophylaxis: Pantoprazole per home regimen  Thrombocytopenia. Hold heparin due to worsening thrombocytopenia. Large hiatal hernia.     Code Status Orders        Start     Ordered   06/25/16 0616  Full code  Continuous     06/25/16 0615    Code Status History    Date Active Date Inactive Code Status Order ID Comments User Context   07/20/2015 12:00 PM 07/23/2015  3:48 PM Full Code [id number]  Epifanio Lesches, MD ED   06/03/2015  3:17 PM 06/07/2015 12:18 PM Full Code [id number]  Bettey Costa, MD Inpatient           Consults  Cardiology, surgery   DVT Prophylaxis  Lovenox    Lab Results  Component Value Date   PLT 79 (L) 06/28/2016     Time Spent in minutes   39 min Greater than 50% of time spent in care coordination and counseling patient regarding the condition and plan of care. Discussed with the patient, his wife, RN and case Freight forwarder.  Demetrios Loll M.D on 06/28/2016 at 6:04 PM  Between 7am to 6pm - Pager - [phone]  After 6pm go to [email or web address] - [facility]   Office  [phone]  

## 2016-06-28 NOTE — Plan of Care (Signed)
Problem: Tissue Perfusion: Goal: Risk factors for ineffective tissue perfusion will decrease Outcome: Not Progressing O2 sat 95% on O2 @2  lpm [place]; Continues to have bilateral Rhonchi and SOB on exertion; congested, weak cough and audible coarse crackles.

## 2016-06-28 NOTE — Progress Notes (Signed)
CC: Abdominal pain Subjective: Patient reports that he doesn't feel as well as morning. Pain is still improved but he states he feels bloated and "sick".  Objective: Vital signs in last 24 hours: Temp:  [98 F (36.7 C)-98.8 F (37.1 C)] 98.8 F (37.1 C) (12/21 0833) Pulse Rate:  [66-109] 109 (12/21 0833) Resp:  [16-19] 18 (12/21 0833) BP: (101-122)/(50-83) 101/83 (12/21 0833) SpO2:  [93 %-98 %] 97 % (12/21 0833) Last BM Date: 06/25/16  Intake/Output from previous day: 12/20 0701 - 12/21 0700 In: 9629 [P.O.:720; I.V.:1076] Out: 1100 [Urine:1100] Intake/Output this shift: Total I/O In: 480 [P.O.:480] Out: -   Physical exam:  Gen.: No acute distress Chest: Auscultation Heart: Tachycardic Abdomen: Soft, mildly distended, tympanic.  Lab Results: CBC   Recent Labs  06/27/16 0512 06/28/16 0533  WBC 5.8 6.1  HGB 10.9* 11.0*  HCT 31.2* 31.9*  PLT 92* 79*   BMET  Recent Labs  06/27/16 0512 06/28/16 0533  NA 138 137  K 3.5 3.6  CL 108 107  CO2 20* 21*  GLUCOSE 63* 163*  BUN 51* 50*  CREATININE 3.41* 3.21*  CALCIUM 8.3* 8.1*   PT/INR No results for input(s): LABPROT, INR in the last 72 hours. ABG  Recent Labs  06/26/16 1145  PHART 7.38  HCO3 18.9*    Studies/Results: No results found.  Anti-infectives: Anti-infectives    Start     Dose/Rate Route Frequency Ordered Stop   06/25/16 0630  meropenem (MERREM) IVPB SOLR 500 mg     500 mg 100 mL/hr over 30 Minutes Intravenous Every 12 hours 06/25/16 0620     06/25/16 0145  ciprofloxacin (CIPRO) IVPB 400 mg     400 mg 200 mL/hr over 60 Minutes Intravenous  Once 06/25/16 0134 06/25/16 0413   06/25/16 0145  metroNIDAZOLE (FLAGYL) IVPB 500 mg     500 mg 100 mL/hr over 60 Minutes Intravenous  Once 06/25/16 0134 06/25/16 0413      Assessment/Plan:  82 year old male admitted with what was initially thought to be cholecystitis. His abdominal pain has continued to resolve and his labs have remained  normal. He did have nausea and vomiting overnight and his abdomen is more distended with tympany and this morning. I will check a plain film x-ray but I suspect he's developed an ileus. No plans for any surgical intervention as there is no current surgical diagnosis requiring treatment at this point. Would expect this to be an ileus secondary to pneumonia or some other disease. Will continue to follow.  Kenyatte Gruber T. Adonis Huguenin, MD, FACS  06/28/2016

## 2016-06-28 NOTE — Progress Notes (Signed)
SUBJECTIVE: Patient is more alert today no chest pain or shortness of breath   Vitals:   06/28/16 0150 06/28/16 0443 06/28/16 0742 06/28/16 0833  BP:  122/65  101/83  Pulse:  87  (!) 109  Resp:  16  18  Temp:  98.4 F (36.9 C)  98.8 F (37.1 C)  TempSrc:  Oral  Oral  SpO2: 94% 95% 94% 97%  Weight:      Height:        Intake/Output Summary (Last 24 hours) at 06/28/16 0848 Last data filed at 06/28/16 0630  Gross per 24 hour  Intake             1796 ml  Output             1100 ml  Net              696 ml    LABS: Basic Metabolic Panel:  Recent Labs  06/27/16 0512 06/28/16 0533  NA 138 137  K 3.5 3.6  CL 108 107  CO2 20* 21*  GLUCOSE 63* 163*  BUN 51* 50*  CREATININE 3.41* 3.21*  CALCIUM 8.3* 8.1*   Liver Function Tests: No results for input(s): AST, ALT, ALKPHOS, BILITOT, PROT, ALBUMIN in the last 72 hours. No results for input(s): LIPASE, AMYLASE in the last 72 hours. CBC:  Recent Labs  06/27/16 0512 06/28/16 0533  WBC 5.8 6.1  HGB 10.9* 11.0*  HCT 31.2* 31.9*  MCV 89.6 87.9  PLT 92* 79*   Cardiac Enzymes: No results for input(s): CKTOTAL, CKMB, CKMBINDEX, TROPONINI in the last 72 hours. BNP: Invalid input(s): POCBNP D-Dimer: No results for input(s): DDIMER in the last 72 hours. Hemoglobin A1C: No results for input(s): HGBA1C in the last 72 hours. Fasting Lipid Panel: No results for input(s): CHOL, HDL, LDLCALC, TRIG, CHOLHDL, LDLDIRECT in the last 72 hours. Thyroid Function Tests: No results for input(s): TSH, T4TOTAL, T3FREE, THYROIDAB in the last 72 hours.  Invalid input(s): FREET3 Anemia Panel: No results for input(s): VITAMINB12, FOLATE, FERRITIN, TIBC, IRON, RETICCTPCT in the last 72 hours.   PHYSICAL EXAM General: Well developed, well nourished, in no acute distress HEENT:  Normocephalic and atramatic Neck:  No JVD.  Lungs: Clear bilaterally to auscultation and percussion. Heart: HRRR . Normal S1 and S2 without gallops or murmurs.   Abdomen: Bowel sounds are positive, abdomen soft and non-tender  Msk:  Back normal, normal gait. Normal strength and tone for age. Extremities: No clubbing, cyanosis or edema.   Neuro: Alert and oriented X 3. Psych:  Good affect, responds appropriately  TELEMETRY:A. fib with ventricular rate about 100.  ASSESSMENT AND PLAN: Atrial fibrillation which is chronic with history of coronary artery disease was admitted to the hospital with cholecystitis and sepsis. Patient's ventricular rate has normalized between 8o to 110, and advise proceeding with cholecystectomy if indicated. Patient would be low to moderate risk from this surgery.  Active Problems:   Hematuria   Sepsis ([place])    Kirk David, MD, [facility] 06/28/2016 8:48 AM

## 2016-06-28 NOTE — Progress Notes (Signed)
Found patient with n/v, green emesis on floor; no antiemetic ordered; CBG 177; on-call hospitalist paged.  Awaiting call back. Barbaraann Faster, RN 12:49 AM 06/28/2016

## 2016-06-29 LAB — BASIC METABOLIC PANEL
Anion gap: 6 (ref 5–15)
BUN: 41 mg/dL — AB (ref 6–20)
CO2: 23 mmol/L (ref 22–32)
CREATININE: 2.79 mg/dL — AB (ref 0.61–1.24)
Calcium: 8.1 mg/dL — ABNORMAL LOW (ref 8.9–10.3)
Chloride: 108 mmol/L (ref 101–111)
GFR calc Af Amer: 22 mL/min — ABNORMAL LOW (ref >60)
GFR, EST NON AFRICAN AMERICAN: 19 mL/min — AB (ref >60)
Glucose, Bld: 124 mg/dL — ABNORMAL HIGH (ref 65–99)
Potassium: 3.5 mmol/L (ref 3.5–5.1)
SODIUM: 137 mmol/L (ref 135–145)

## 2016-06-29 LAB — MAGNESIUM: MAGNESIUM: 2 mg/dL (ref 1.7–2.4)

## 2016-06-29 MED ORDER — KCL IN DEXTROSE-NACL 40-5-0.45 MEQ/L-%-% IV SOLN
INTRAVENOUS | Status: DC
  Administered 2016-06-29 – 2016-06-30 (×2): via INTRAVENOUS
  Filled 2016-06-29 (×3): qty 1000

## 2016-06-29 MED ORDER — ORAL CARE MOUTH RINSE
15.0000 mL | Freq: Two times a day (BID) | OROMUCOSAL | Status: DC
  Administered 2016-06-29: 15 mL via OROMUCOSAL

## 2016-06-29 MED ORDER — POTASSIUM CHLORIDE 2 MEQ/ML IV SOLN: INTRAVENOUS | Status: DC

## 2016-06-29 NOTE — Progress Notes (Signed)
[place] at [facility]                                                                                                                                                                                  Patient Demographics   [name], is a 82 y.o. male, DOB - [birth year], LOV:[id number]  Admit date - 06/24/2016   Admitting Physician Harrie Foreman, MD  Outpatient Primary MD for the patient is Vista Mink, FNP   LOS - 4  Subjective: Patient has no abdominal pain but distention. Had BM.  better cough and SOB, on O2 [place] 2L.  Review of Systems:   CONSTITUTIONAL: No documented fever. Has weakness. No weight gain, no weight loss.  EYES: No blurry or double vision.  ENT: No tinnitus. No postnasal drip. No redness of the oropharynx.  RESPIRATORY: Positive cough, no wheeze, no hemoptysis. Has dyspnea.  CARDIOVASCULAR: No chest pain. No orthopnea. No palpitations. No syncope.  GASTROINTESTINAL: No nausea, no vomiting or diarrhea. No abdominal pain and has distention. No melena or hematochezia. has bowel movement. GENITOURINARY:  No urgency. No frequency. No dysuria. No hematuria. No obstructive symptoms. No discharge. No pain. No significant abnormal bleeding ENDOCRINE: No polyuria or nocturia. No heat or cold intolerance.  HEMATOLOGY: No anemia. No bruising. No bleeding. No purpura. No petechiae INTEGUMENTARY: No rashes. No lesions.  MUSCULOSKELETAL: No arthritis. No swelling. No gout.  NEUROLOGIC: No numbness, tingling, or ataxia. No seizure-type activity.  PSYCHIATRIC: No anxiety. No insomnia. No ADD.     Vitals:   Vitals:   06/29/16 0510 06/29/16 0740 06/29/16 1048 06/29/16 1411  BP: (!) 135/57  (!) 138/51 (!) 131/56  Pulse: 90  77 88  Resp: 17  20 20   Temp: 99 F (37.2 C)   98.7 F (37.1 C)  TempSrc: Oral   Oral  SpO2: 99% 96% 97% 97%  Weight:      Height:        Wt Readings from Last 3 Encounters:  06/24/16 167 lb (75.8 kg)   05/23/16 162 lb 9.4 oz (73.8 kg)  11/24/15 170 lb 1.4 oz (77.2 kg)     Intake/Output Summary (Last 24 hours) at 06/29/16 1655 Last data filed at 06/29/16 1623  Gross per 24 hour  Intake              859 ml  Output              350 ml  Net              509 ml    Physical Exam:   GENERAL: Pleasant-appearing in no apparent distress.  HEAD, EYES, EARS,  NOSE AND THROAT: Atraumatic, normocephalic. Extraocular muscles are intact. Pupils equal and reactive to light. Sclerae anicteric. No conjunctival injection. No oro-pharyngeal erythema.  NECK: Supple. There is no jugular venous distention. No bruits, no lymphadenopathy, no thyromegaly.  HEART: irregular, irrrgular . No murmurs, no rubs, no clicks.  LUNGS: Rhonchus breath sounds No rales or. No wheezes.  ABDOMEN: Abdominal distension and tenderness, bowel sounds presents, no rigidity or rebound. EXTREMITIES: No evidence of any cyanosis, clubbing, or peripheral edema.  +2 pedal and radial pulses bilaterally.  NEUROLOGIC: AAO 3, but looks confused. SKIN: Moist and warm with no rashes appreciated.  Psych: Not anxious, depressed LN: No inguinal LN enlargement    Antibiotics   Anti-infectives    Start     Dose/Rate Route Frequency Ordered Stop   06/25/16 0630  meropenem (MERREM) IVPB SOLR 500 mg     500 mg 100 mL/hr over 30 Minutes Intravenous Every 12 hours 06/25/16 0620     06/25/16 0145  ciprofloxacin (CIPRO) IVPB 400 mg     400 mg 200 mL/hr over 60 Minutes Intravenous  Once 06/25/16 0134 06/25/16 0413   06/25/16 0145  metroNIDAZOLE (FLAGYL) IVPB 500 mg     500 mg 100 mL/hr over 60 Minutes Intravenous  Once 06/25/16 0134 06/25/16 0413      Medications   Scheduled Meds: . aspirin EC  81 mg Oral QHS  . atorvastatin  40 mg Oral QHS  . cholecalciferol  5,000 Units Oral Daily  . docusate sodium  100 mg Oral BID  . doxazosin  4 mg Oral QHS  . ferrous sulfate  325 mg Oral BID  . guaiFENesin  600 mg Oral BID  .  ipratropium-albuterol  3 mL Nebulization Q6H  . levothyroxine  100 mcg Oral Daily  . mouth rinse  15 mL Mouth Rinse BID  . meropenem  500 mg Intravenous Q12H  . metoprolol tartrate  25 mg Oral BID  . omega-3 acid ethyl esters  1 capsule Oral BID  . pantoprazole  40 mg Oral BID  . potassium chloride SA  20 mEq Oral BID  . sodium bicarbonate  1,300 mg Oral BID  . sodium chloride flush  3 mL Intravenous Q12H  . tamsulosin  0.4 mg Oral Daily   Continuous Infusions: . dextrose 5 % and 0.45 % NaCl with KCl 40 mEq/L 50 mL/hr at 06/29/16 0847   PRN Meds:.acetaminophen **OR** acetaminophen, albuterol, metoprolol, nitroGLYCERIN, ondansetron (ZOFRAN) IV   Data Review:   Micro Results Recent Results (from the past 240 hour(s))  Blood culture (routine x 2)     Status: None (Preliminary result)   Collection Time: 06/25/16  2:08 AM  Result Value Ref Range Status   Specimen Description BLOOD LEFT ANTECUBITAL  Final   Special Requests   Final    BOTTLES DRAWN AEROBIC AND ANAEROBIC 10CCAERO,10CCANA   Culture NO GROWTH 4 DAYS  Final   Report Status PENDING  Incomplete  Blood culture (routine x 2)     Status: None (Preliminary result)   Collection Time: 06/25/16  2:09 AM  Result Value Ref Range Status   Specimen Description BLOOD LEFT WRIST  Final   Special Requests BOTTLES DRAWN AEROBIC AND ANAEROBIC 2CCAERO,3CCANA  Final   Culture NO GROWTH 4 DAYS  Final   Report Status PENDING  Incomplete    Radiology Reports Ct Abdomen Pelvis Wo Contrast  Result Date: 06/25/2016 CLINICAL DATA:  82 year old male with sepsis and abdominal pain. History of prostate cancer. EXAM: CT  CHEST, ABDOMEN AND PELVIS WITHOUT CONTRAST TECHNIQUE: Multidetector CT imaging of the chest, abdomen and pelvis was performed following the standard protocol without IV contrast. COMPARISON:  None. FINDINGS: Evaluation of this exam is limited in the absence of intravenous contrast. CT CHEST FINDINGS Cardiovascular: There is no  cardiomegaly or pericardial effusion. Multi vessel coronary vascular calcification involving the LAD, left circumflex, and RCA. There is atherosclerotic calcification of the thoracic aorta. No aneurysmal dilatation. The central pulmonary arteries are grossly unremarkable on this noncontrast study. Mediastinum/Nodes: No hilar or mediastinal adenopathy. There is a moderate size hiatal hernia. The esophagus is grossly unremarkable. Subcentimeter right thyroid nodule. No mediastinal collection or hematoma. Lungs/Pleura: Bibasilar dependent atelectatic changes. Left lung base compressive atelectasis secondary to hiatal hernia. Pneumonia is less likely. There is no pleural effusion or pneumothorax. The central airways are patent. Musculoskeletal: No axillary adenopathy. The chest wall soft tissues appear unremarkable. There is osteopenia with degenerative changes of the spine. No acute fracture. CT ABDOMEN PELVIS FINDINGS No intra-abdominal free air. Small subhepatic and pericholecystic fluid. Hepatobiliary: The liver is grossly unremarkable. There multiple stones within the gallbladder fundus. A stone is noted at the gallbladder neck. There is diffuse gallbladder wall thickening and edema. A 5.6 x 6.3 cm fluid collection noted in the right upper abdomen inferior to the gallbladder. This collection does not demonstrate an organized wall or definite evidence of loculation. Pancreas: The pancreas is atrophic. No inflammatory changes of pancreas. Spleen: Normal in size without focal abnormality. Adrenals/Urinary Tract: The adrenal glands appear unremarkable. The left kidney is atrophic. There is a 15 mm stone in the left kidney. There are two adjacent stones in the distal left ureter measuring up to 6 mm. There is moderate left hydronephrosis. Punctate nonobstructing right renal upper pole calculus. There is no hydronephrosis on the right. The right ureter appears unremarkable. The urinary bladder is predominantly collapsed.  There is apparent diffuse thickening of the bladder wall which may be partly related to underdistention. Cystitis is not excluded. Correlation with urinalysis recommended. Stomach/Bowel: There is extensive sigmoid diverticulosis without active inflammatory changes. Moderate size hiatal hernia noted. There is no evidence of bowel obstruction for active inflammation. The appendix appears unremarkable. Vascular/Lymphatic: There is moderate aortoiliac atherosclerotic disease. No aneurysmal dilatation. No portal venous gas identified. There is no adenopathy. Reproductive: The prostate and seminal vesicles are grossly unremarkable. Other: Small fat containing left inguinal hernia. No fluid collection. Musculoskeletal: Osteopenia.  No acute fracture. IMPRESSION: Cholelithiasis with findings most compatible with acute cholecystitis. A 4 mm stone at the neck of the gallbladder is concerning for an obstructing gallstone. Correlation with clinical exam recommended. Ultrasound may provide additional information. Small amount of fluid inferior to the gallbladder does not demonstrate loculation or organized wall. Atrophic left kidney. Two adjacent distal left ureteral calculi measuring up to 6 mm with moderate left hydronephrosis. A 15 mm left renal calculus is also noted. Punctate nonobstructing right renal upper pole stone. No hydronephrosis. Thickened bladder wall. Correlation with urinalysis recommended to evaluate for cystitis. Extensive colonic diverticulosis. No evidence of bowel obstruction or active inflammation. She Electronically Signed   By: Anner Crete M.D.   On: 06/25/2016 02:39   Dg Chest 1 View  Result Date: 06/26/2016 CLINICAL DATA:  Shortness of breath. EXAM: CHEST 1 VIEW COMPARISON:  CT 06/25/2016 . FINDINGS: Mediastinum and hilar structures are normal. Elevation left hemidiaphragm noted. Hiatal hernia as noted on prior CT noted. Mild basilar atelectasis on the left. Small left pleural effusion cannot  be excluded. No pneumothorax. IMPRESSION: Elevation of the left hemidiaphragm. Hiatal hernia is noted on prior CT of 06/25/2016 noted. Mild basilar atelectasis on the left noted.Small left pleural effusion cannot be excluded. Electronically Signed   By: Marcello Moores  Register   On: 06/26/2016 10:43   Dg Chest 2 View  Result Date: 06/28/2016 CLINICAL DATA:  Abdomen pain patient feels bloated and sick EXAM: CHEST  2 VIEW COMPARISON:  06/26/2016 FINDINGS: Persistent elevation of the left diaphragm. Small left effusion and left basilar atelectasis or infiltrate. Stable cardiomegaly. Large hiatal hernia. No pneumothorax. IMPRESSION: 1. No significant interval change in small left pleural effusion and left basilar atelectasis or infiltrate 2. Large hiatal hernia 3. Cardiomegaly Electronically Signed   By: Donavan Foil M.D.   On: 06/28/2016 14:08   Ct Chest Wo Contrast  Result Date: 06/25/2016 CLINICAL DATA:  82 year old male with sepsis and abdominal pain. History of prostate cancer. EXAM: CT CHEST, ABDOMEN AND PELVIS WITHOUT CONTRAST TECHNIQUE: Multidetector CT imaging of the chest, abdomen and pelvis was performed following the standard protocol without IV contrast. COMPARISON:  None. FINDINGS: Evaluation of this exam is limited in the absence of intravenous contrast. CT CHEST FINDINGS Cardiovascular: There is no cardiomegaly or pericardial effusion. Multi vessel coronary vascular calcification involving the LAD, left circumflex, and RCA. There is atherosclerotic calcification of the thoracic aorta. No aneurysmal dilatation. The central pulmonary arteries are grossly unremarkable on this noncontrast study. Mediastinum/Nodes: No hilar or mediastinal adenopathy. There is a moderate size hiatal hernia. The esophagus is grossly unremarkable. Subcentimeter right thyroid nodule. No mediastinal collection or hematoma. Lungs/Pleura: Bibasilar dependent atelectatic changes. Left lung base compressive atelectasis secondary  to hiatal hernia. Pneumonia is less likely. There is no pleural effusion or pneumothorax. The central airways are patent. Musculoskeletal: No axillary adenopathy. The chest wall soft tissues appear unremarkable. There is osteopenia with degenerative changes of the spine. No acute fracture. CT ABDOMEN PELVIS FINDINGS No intra-abdominal free air. Small subhepatic and pericholecystic fluid. Hepatobiliary: The liver is grossly unremarkable. There multiple stones within the gallbladder fundus. A stone is noted at the gallbladder neck. There is diffuse gallbladder wall thickening and edema. A 5.6 x 6.3 cm fluid collection noted in the right upper abdomen inferior to the gallbladder. This collection does not demonstrate an organized wall or definite evidence of loculation. Pancreas: The pancreas is atrophic. No inflammatory changes of pancreas. Spleen: Normal in size without focal abnormality. Adrenals/Urinary Tract: The adrenal glands appear unremarkable. The left kidney is atrophic. There is a 15 mm stone in the left kidney. There are two adjacent stones in the distal left ureter measuring up to 6 mm. There is moderate left hydronephrosis. Punctate nonobstructing right renal upper pole calculus. There is no hydronephrosis on the right. The right ureter appears unremarkable. The urinary bladder is predominantly collapsed. There is apparent diffuse thickening of the bladder wall which may be partly related to underdistention. Cystitis is not excluded. Correlation with urinalysis recommended. Stomach/Bowel: There is extensive sigmoid diverticulosis without active inflammatory changes. Moderate size hiatal hernia noted. There is no evidence of bowel obstruction for active inflammation. The appendix appears unremarkable. Vascular/Lymphatic: There is moderate aortoiliac atherosclerotic disease. No aneurysmal dilatation. No portal venous gas identified. There is no adenopathy. Reproductive: The prostate and seminal vesicles are  grossly unremarkable. Other: Small fat containing left inguinal hernia. No fluid collection. Musculoskeletal: Osteopenia.  No acute fracture. IMPRESSION: Cholelithiasis with findings most compatible with acute cholecystitis. A 4 mm stone at the neck of the gallbladder  is concerning for an obstructing gallstone. Correlation with clinical exam recommended. Ultrasound may provide additional information. Small amount of fluid inferior to the gallbladder does not demonstrate loculation or organized wall. Atrophic left kidney. Two adjacent distal left ureteral calculi measuring up to 6 mm with moderate left hydronephrosis. A 15 mm left renal calculus is also noted. Punctate nonobstructing right renal upper pole stone. No hydronephrosis. Thickened bladder wall. Correlation with urinalysis recommended to evaluate for cystitis. Extensive colonic diverticulosis. No evidence of bowel obstruction or active inflammation. She Electronically Signed   By: Anner Crete M.D.   On: 06/25/2016 02:39   Nm Hepatobiliary Liver Func  Result Date: 06/25/2016 CLINICAL DATA:  Abdominal pain 3 days. Cholelithiasis and acute cholecystitis. EXAM: NUCLEAR MEDICINE HEPATOBILIARY IMAGING TECHNIQUE: Sequential images of the abdomen were obtained out to 60 minutes following intravenous administration of radiopharmaceutical. RADIOPHARMACEUTICALS:  4.9 mCi Tc-60m  Choletec IV COMPARISON:  CT on 06/25/2016 FINDINGS: Prompt uptake and biliary excretion of activity by the liver is seen. Gallbladder activity is visualized, consistent with patency of cystic duct. Biliary activity passes into small bowel, consistent with patent common bile duct. IMPRESSION: Patency of both cystic and common bile ducts demonstrated (despite findings of acute cholecystitis on recent CT). See previous CT report. Electronically Signed   By: Earle Gell M.D.   On: 06/25/2016 15:00   Dg Chest Portable 1 View  Result Date: 06/25/2016 CLINICAL DATA:  82 year old male  with acute respiratory distress. EXAM: PORTABLE CHEST 1 VIEW COMPARISON:  Chest radiograph dated 12/01/2010 and abdominal CT dated 07/12/2015 FINDINGS: Mild diffuse interstitial coarsening. No focal consolidation, pleural effusion, or pneumothorax. Moderate size hiatal hernia. Stable cardiac silhouette. No acute osseous pathology. IMPRESSION: No acute cardiopulmonary process. Moderate hiatal hernia. Electronically Signed   By: Anner Crete M.D.   On: 06/25/2016 00:50   Dg Abd 2 Views  Result Date: 06/28/2016 CLINICAL DATA:  Diffuse abdominal pain EXAM: ABDOMEN - 2 VIEW COMPARISON:  06/25/2016 FINDINGS: Scattered large and small bowel gas is noted. Diffuse dilatation of multiple small bowel loops is seen. No free air is noted. A paucity of colonic gas is seen. These changes are consistent with a at least partial small bowel obstruction. No acute bony abnormality is seen. IMPRESSION: Changes consistent with at least a partial small bowel obstruction. No free air is noted. These results will be called to the ordering clinician or representative by the Radiologist Assistant, and communication documented in the PACS or zVision Dashboard. Electronically Signed   By: Inez Catalina M.D.   On: 06/28/2016 14:06     CBC  Recent Labs Lab 06/25/16 0009 06/26/16 0449 06/27/16 0512 06/28/16 0533  WBC 5.7 5.8 5.8 6.1  HGB 13.4 11.2* 10.9* 11.0*  HCT 38.8* 32.6* 31.2* 31.9*  PLT 132* 114* 92* 79*  MCV 88.7 90.2 89.6 87.9  MCH 30.7 31.1 31.3 30.3  MCHC 34.6 34.4 34.9 34.5  RDW 14.8* 15.4* 15.1* 15.4*  LYMPHSABS 0.2*  --   --   --   MONOABS 0.5  --   --   --   EOSABS 0.0  --   --   --   BASOSABS 0.0  --   --   --     Chemistries   Recent Labs Lab 06/25/16 0009 06/26/16 0449 06/27/16 0512 06/28/16 0533 06/29/16 0508  NA 133* 136 138 137 137  K 3.9 4.0 3.5 3.6 3.5  CL 103 109 108 107 108  CO2 20* 19* 20* 21* 23  GLUCOSE 101* 107* 63* 163* 124*  BUN 35* 45* 51* 50* 41*  CREATININE 3.10*  3.00* 3.41* 3.21* 2.79*  CALCIUM 8.8* 8.0* 8.3* 8.1* 8.1*  MG  --   --   --   --  2.0  AST 26  --   --   --   --   ALT 15*  --   --   --   --   ALKPHOS 42  --   --   --   --   BILITOT 1.5*  --   --   --   --    ------------------------------------------------------------------------------------------------------------------ estimated creatinine clearance is 18 mL/min (by C-G formula based on SCr of 2.79 mg/dL (H)). ------------------------------------------------------------------------------------------------------------------ No results for input(s): HGBA1C in the last 72 hours. ------------------------------------------------------------------------------------------------------------------ No results for input(s): CHOL, HDL, LDLCALC, TRIG, CHOLHDL, LDLDIRECT in the last 72 hours. ------------------------------------------------------------------------------------------------------------------ No results for input(s): TSH, T4TOTAL, T3FREE, THYROIDAB in the last 72 hours.  Invalid input(s): FREET3 ------------------------------------------------------------------------------------------------------------------ No results for input(s): VITAMINB12, FOLATE, FERRITIN, TIBC, IRON, RETICCTPCT in the last 72 hours.  Coagulation profile  Recent Labs Lab 06/25/16 0009  INR 1.12    No results for input(s): DDIMER in the last 72 hours.  Cardiac Enzymes  Recent Labs Lab 06/25/16 0009  TROPONINI 0.04*   ------------------------------------------------------------------------------------------------------------------ Invalid input(s): POCBNP    Assessment & Plan   This is an 82 year old male admitted for sepsis secondary to cholecystitis. 1. Sepsis:  Possible Secondary to cholecystitis;  HIDA scan with no obstruction however clinically cholecystitis, improved. continue IV anabiotic  [place]. on nothing by mouth except medication, start full liquid diet.  2. A. fib with RVR  heart rate heart rate,  stable echo reviewed showed some diastolic dysfunction hr stable continue metoprolol, lower the dose due to low blood pressure. Continue aspirin.  3. Chronic kidney disease: Stage IV;  4. CHF: Systolic; chronic, stable. hold  torsemide and  continue metoprolol per home regimen, cxr without chf or pna 5. Hypertension: hold hydralazine and imdur due to low blood pressure. 6. Acute encephalopathy suspected due to acute delirium as result of hospitalization supportive care now improved 7. Hypothyroidism: Continue Synthroid 8. Prostate cancer: Recent Lupron injection. Continue tamsulosin and Cardura for urinary retention 9. Hyperlipidemia: Continue statin therapy 10. DVT prophylaxis: Heparin 11. GI prophylaxis: Pantoprazole per home regimen  Thrombocytopenia. Hold heparin due to worsening thrombocytopenia. Large hiatal hernia.     Code Status Orders        Start     Ordered   06/25/16 0616  Full code  Continuous     06/25/16 0615    Code Status History    Date Active Date Inactive Code Status Order ID Comments User Context   07/20/2015 12:00 PM 07/23/2015  3:48 PM Full Code [id number]  Epifanio Lesches, MD ED   06/03/2015  3:17 PM 06/07/2015 12:18 PM Full Code [id number]  Bettey Costa, MD Inpatient           Consults  Cardiology, surgery   DVT Prophylaxis  Lovenox    Lab Results  Component Value Date   PLT 79 (L) 06/28/2016     Time Spent in minutes   38 min Greater than 50% of time spent in care coordination and counseling patient regarding the condition and plan of care. Discussed with the patient, his wife, RN and case manager, Dr. Shannan Harper, Sheppard Evens M.D on 06/29/2016 at 4:55 PM  Between 7am to 6pm - Pager - [phone]  After 6pm go to [email or web address] -  password [email or web address] [facility]  [facility]   Office  [phone]

## 2016-06-29 NOTE — Care Management (Signed)
Patient admitted for sepsis.  Patient lives at home with wife.  Adult children live locally for support.  Patient states that he has RW, cane, and BSC in the home if needed.  PCP [facility].  Pharmacy [facility].  PT has assessed patient and recommended home health PT.  Patient is agreeable.  Agency preference list provided.  Patient does not have preference.  Heads up referral to [facility] with [place]. RNCM following

## 2016-06-29 NOTE — Progress Notes (Signed)
ANTIBIOTIC CONSULT NOTE - INITIAL  Pharmacy Consult for Meropenem  Indication: gall bladder infection  Allergies  Allergen Reactions  . Ativan [Lorazepam] Other (See Comments)    Hallucinations, combative  . Penicillin G Other (See Comments)    Has patient had a PCN reaction causing immediate rash, facial/tongue/throat swelling, SOB or lightheadedness with hypotension: Yes Has patient had a PCN reaction causing severe rash involving mucus membranes or skin necrosis: No Has patient had a PCN reaction that required hospitalization No Has patient had a PCN reaction occurring within the last 10 years: No If all of the above answers are "NO", then may proceed with Cephalosporin use.     Patient Measurements: Height: 5\' 8"  (172.7 cm) Weight: 167 lb (75.8 kg) IBW/kg (Calculated) : 68.4 Adjusted Body Weight:   Vital Signs: Temp: 99 F (37.2 C) (12/22 0510) Temp Source: Oral (12/22 0510) BP: 135/57 (12/22 0510) Pulse Rate: 90 (12/22 0510) Intake/Output from previous day: 12/21 0701 - 12/22 0700 In: 1194 [P.O.:480; I.V.:714] Out: 525 [Urine:525] Intake/Output from this shift: Total I/O In: -  Out: 50 [Urine:50]  Labs:  Recent Labs  06/27/16 0512 06/28/16 0533 06/29/16 0508  WBC 5.8 6.1  --   HGB 10.9* 11.0*  --   PLT 92* 79*  --   CREATININE 3.41* 3.21* 2.79*   Estimated Creatinine Clearance: 18 mL/min (by C-G formula based on SCr of 2.79 mg/dL (H)). No results for input(s): VANCOTROUGH, VANCOPEAK, VANCORANDOM, GENTTROUGH, GENTPEAK, GENTRANDOM, TOBRATROUGH, TOBRAPEAK, TOBRARND, AMIKACINPEAK, AMIKACINTROU, AMIKACIN in the last 72 hours.   Microbiology: Recent Results (from the past 720 hour(s))  Blood culture (routine x 2)     Status: None (Preliminary result)   Collection Time: 06/25/16  2:08 AM  Result Value Ref Range Status   Specimen Description BLOOD LEFT ANTECUBITAL  Final   Special Requests   Final    BOTTLES DRAWN AEROBIC AND ANAEROBIC 10CCAERO,10CCANA   Culture NO GROWTH 4 DAYS  Final   Report Status PENDING  Incomplete  Blood culture (routine x 2)     Status: None (Preliminary result)   Collection Time: 06/25/16  2:09 AM  Result Value Ref Range Status   Specimen Description BLOOD LEFT WRIST  Final   Special Requests BOTTLES DRAWN AEROBIC AND ANAEROBIC 2CCAERO,3CCANA  Final   Culture NO GROWTH 4 DAYS  Final   Report Status PENDING  Incomplete    Medical History: Past Medical History:  Diagnosis Date  . Cardiac disease   . CHF (congestive heart failure) ([place])   . Hypercholesteremia   . Hypertension   . Kidney failure    left  . Osteoporosis   . Prostate cancer ([place])   . Shingles   . Skin cancer    right cheek    Assessment: CrCl = 18 ml/min   Goal of Therapy:  resolution of infection  Plan:  Continue meropenem 500 mg IV Q12H.    Rexene Edison, PharmD, BCPS Clinical Pharmacist  06/29/2016 9:51 AM

## 2016-06-29 NOTE — Progress Notes (Signed)
CC: Abdominal pain Subjective: Patient reports that his abdominal pain and abdominal bloating are improved this morning. Thinks he passed some gas and had a small bowel movement last night. Denies any current nausea or vomiting. States that he is feeling much better than the day prior.  Objective: Vital signs in last 24 hours: Temp:  [98.2 F (36.8 C)-99.5 F (37.5 C)] 99 F (37.2 C) (12/22 0510) Pulse Rate:  [90-109] 90 (12/22 0510) Resp:  [17-18] 17 (12/22 0510) BP: (101-150)/(57-83) 135/57 (12/22 0510) SpO2:  [94 %-100 %] 96 % (12/22 0740) Last BM Date: 06/25/16  Intake/Output from previous day: 12/21 0701 - 12/22 0700 In: 1194 [P.O.:480; I.V.:714] Out: 525 [Urine:525] Intake/Output this shift: Total I/O In: -  Out: 50 [Urine:50]  Physical exam:  Gen.: No acute distress Chest: Coarse on auscultation Heart: Regular rate and rhythm Abdomen: Soft, nontender, minimally distended.  Lab Results: CBC   Recent Labs  06/27/16 0512 06/28/16 0533  WBC 5.8 6.1  HGB 10.9* 11.0*  HCT 31.2* 31.9*  PLT 92* 79*   BMET  Recent Labs  06/28/16 0533 06/29/16 0508  NA 137 137  K 3.6 3.5  CL 107 108  CO2 21* 23  GLUCOSE 163* 124*  BUN 50* 41*  CREATININE 3.21* 2.79*  CALCIUM 8.1* 8.1*   PT/INR No results for input(s): LABPROT, INR in the last 72 hours. ABG  Recent Labs  06/26/16 1145  PHART 7.38  HCO3 18.9*    Studies/Results: Dg Chest 2 View  Result Date: 06/28/2016 CLINICAL DATA:  Abdomen pain patient feels bloated and sick EXAM: CHEST  2 VIEW COMPARISON:  06/26/2016 FINDINGS: Persistent elevation of the left diaphragm. Small left effusion and left basilar atelectasis or infiltrate. Stable cardiomegaly. Large hiatal hernia. No pneumothorax. IMPRESSION: 1. No significant interval change in small left pleural effusion and left basilar atelectasis or infiltrate 2. Large hiatal hernia 3. Cardiomegaly Electronically Signed   By: Donavan Foil M.D.   On: 06/28/2016  14:08   Dg Abd 2 Views  Result Date: 06/28/2016 CLINICAL DATA:  Diffuse abdominal pain EXAM: ABDOMEN - 2 VIEW COMPARISON:  06/25/2016 FINDINGS: Scattered large and small bowel gas is noted. Diffuse dilatation of multiple small bowel loops is seen. No free air is noted. A paucity of colonic gas is seen. These changes are consistent with a at least partial small bowel obstruction. No acute bony abnormality is seen. IMPRESSION: Changes consistent with at least a partial small bowel obstruction. No free air is noted. These results will be called to the ordering clinician or representative by the Radiologist Assistant, and communication documented in the PACS or zVision Dashboard. Electronically Signed   By: Inez Catalina M.D.   On: 06/28/2016 14:06    Anti-infectives: Anti-infectives    Start     Dose/Rate Route Frequency Ordered Stop   06/25/16 0630  meropenem (MERREM) IVPB SOLR 500 mg     500 mg 100 mL/hr over 30 Minutes Intravenous Every 12 hours 06/25/16 0620     06/25/16 0145  ciprofloxacin (CIPRO) IVPB 400 mg     400 mg 200 mL/hr over 60 Minutes Intravenous  Once 06/25/16 0134 06/25/16 0413   06/25/16 0145  metroNIDAZOLE (FLAGYL) IVPB 500 mg     500 mg 100 mL/hr over 60 Minutes Intravenous  Once 06/25/16 0134 06/25/16 0413      Assessment/Plan:  82 year old male with multiple medical problems admitted with what was thought to be cholecystitis. There are no physical or laboratory findings to  suggest continued cholecystitis. Images yesterday interpreted by radiology as small bowel obstruction but this most likely represents an ileus to his recent infections. Encourage physical activity and pulmonary toilet. Await return of bowel function. No plans for any surgical intervention.  Maygen Sirico T. Adonis Huguenin, MD, FACS  06/29/2016

## 2016-06-29 NOTE — Progress Notes (Signed)
Physical Therapy Treatment Patient Details Name: [name] MRN: [id number] DOB: 19-May-1927 Today's Date: 06/29/2016    History of Present Illness Pt is admitted for acute choleysitis. Pt with complaints of abdominal pain and SOB x 3 days. Pt with history of CKD, prostate cancer, CHF, shingles, and HTN. Pt is now NPO.    PT Comments    Pt is making good progress towards goals with improved ambulation noted this session. All mobility performed with 2L of O2 with sats WNL. Improved balance using RW. Good endurance with there-ex. Confused to situation this date, unsure that he had O2 in nose. Will need 24/7 assist at home. Encouraged pt to continue getting up to recliner every day.  Follow Up Recommendations  Home health PT;Supervision/Assistance - 24 hour     Equipment Recommendations  None recommended by PT    Recommendations for Other Services       Precautions / Restrictions Precautions Precautions: Fall Restrictions Weight Bearing Restrictions: No    Mobility  Bed Mobility Overal bed mobility: Needs Assistance Bed Mobility: Supine to Sit     Supine to sit: Min assist     General bed mobility comments: assist for trunk elevation by pt reaching out for therapist hands. Once seated at EOB, pt able to sit with supervision. Slightly impulsive technique  Transfers Overall transfer level: Needs assistance Equipment used: Rolling walker (2 wheeled) Transfers: Sit to/from Stand Sit to Stand: Supervision         General transfer comment: impulsive, tries to stand prior to therapist approval. Once standing, pt stands with upright posture, no LOB noted  Ambulation/Gait Ambulation/Gait assistance: Min guard Ambulation Distance (Feet): 150 Feet Assistive device: Rolling walker (2 wheeled) Gait Pattern/deviations: Step-through pattern     General Gait Details: ambulated using reciprocal gait pattern. Needs constant cues for technique keeping RW close to body and  rolling instead of picking it up. WIth cues, pt able to self correct and finally forms a fluid gait pattern. During ambulation diaper begins to fall off, pt able to reach down and pull up. Returned back to room, aide called for assistance.   Stairs            Wheelchair Mobility    Modified Rankin (Stroke Patients Only)       Balance                                    Cognition Arousal/Alertness: Awake/alert Behavior During Therapy: Flat affect Overall Cognitive Status: History of cognitive impairments - at baseline                      Exercises Other Exercises Other Exercises: supine ther-ex performed including B LE ankle pumps, SLRs, hip abd/add, and heel slides x 10 reps with cga. Cues for correct technique.    General Comments        Pertinent Vitals/Pain Pain Assessment: No/denies pain    Home Living                      Prior Function            PT Goals (current goals can now be found in the care plan section) Acute Rehab PT Goals Patient Stated Goal: get stronger PT Goal Formulation: With patient Time For Goal Achievement: 07/11/16 Potential to Achieve Goals: Good Progress towards PT goals: Progressing toward goals  Frequency    Min 2X/week      PT Plan Current plan remains appropriate    Co-evaluation             End of Session Equipment Utilized During Treatment: Gait belt;Oxygen Activity Tolerance: Patient tolerated treatment well Patient left: in chair;with chair alarm set;with family/visitor present     Time: 1580-6386 PT Time Calculation (min) (ACUTE ONLY): 30 min  Charges:  $Gait Training: 8-22 mins $Therapeutic Exercise: 8-22 mins                    G Codes:      Daleon Willinger 2016-07-28, 2:59 PM  Greggory Stallion, PT, DPT [phone]

## 2016-06-29 NOTE — Care Management Important Message (Signed)
Important Message  Patient Details  Name: TAGGART PRASAD MRN: [id number] Date of Birth: [birth year]   Medicare Important Message Given:  Yes    Beverly Sessions, RN 06/29/2016, 3:47 PM

## 2016-06-30 LAB — CULTURE, BLOOD (ROUTINE X 2)
CULTURE: NO GROWTH
Culture: NO GROWTH

## 2016-06-30 MED ORDER — CIPROFLOXACIN HCL 500 MG PO TABS
500.0000 mg | ORAL_TABLET | ORAL | Status: DC
  Administered 2016-06-30: 500 mg via ORAL
  Filled 2016-06-30: qty 1

## 2016-06-30 MED ORDER — CIPROFLOXACIN HCL 500 MG PO TABS: 250.0000 mg | ORAL_TABLET | Freq: Two times a day (BID) | ORAL | Status: DC

## 2016-06-30 MED ORDER — CIPROFLOXACIN HCL 500 MG PO TABS: 500.0000 mg | ORAL_TABLET | Freq: Every day | ORAL | 0 refills | Status: DC

## 2016-06-30 NOTE — Progress Notes (Signed)
Discharge instructions reivewed with the patient and his wife.  rx for cipro given to wife.  Pt will be assisted dressing and pushed out to his car via wheelchair

## 2016-06-30 NOTE — Progress Notes (Signed)
CC: Abdominal pain Subjective: Patient continues to report feeling better and better. Denies any abdominal pain currently. Has had evidence of bowel function and has been tolerating liquids.  Objective: Vital signs in last 24 hours: Temp:  [98.7 F (37.1 C)-99.1 F (37.3 C)] 98.8 F (37.1 C) (12/23 0831) Pulse Rate:  [69-88] 74 (12/23 0831) Resp:  [18-20] 18 (12/23 0831) BP: (131-158)/(55-80) 151/55 (12/23 0831) SpO2:  [95 %-100 %] 97 % (12/23 1114) Last BM Date: 06/29/16  Intake/Output from previous day: 12/22 0701 - 12/23 0700 In: 504 [I.V.:504] Out: 300 [Urine:300] Intake/Output this shift: Total I/O In: 1049.2 [I.V.:1049.2] Out: 150 [Urine:150]  Physical exam:  Gen.: No acute distress Chest: Clear to auscultation Heart: Regular rate and rhythm Abdomen: Soft, nontender, minimally distended  Lab Results: CBC   Recent Labs  06/28/16 0533  WBC 6.1  HGB 11.0*  HCT 31.9*  PLT 79*   BMET  Recent Labs  06/28/16 0533 06/29/16 0508  NA 137 137  K 3.6 3.5  CL 107 108  CO2 21* 23  GLUCOSE 163* 124*  BUN 50* 41*  CREATININE 3.21* 2.79*  CALCIUM 8.1* 8.1*   PT/INR No results for input(s): LABPROT, INR in the last 72 hours. ABG No results for input(s): PHART, HCO3 in the last 72 hours.  Invalid input(s): PCO2, PO2  Studies/Results: Dg Chest 2 View  Result Date: 06/28/2016 CLINICAL DATA:  Abdomen pain patient feels bloated and sick EXAM: CHEST  2 VIEW COMPARISON:  06/26/2016 FINDINGS: Persistent elevation of the left diaphragm. Small left effusion and left basilar atelectasis or infiltrate. Stable cardiomegaly. Large hiatal hernia. No pneumothorax. IMPRESSION: 1. No significant interval change in small left pleural effusion and left basilar atelectasis or infiltrate 2. Large hiatal hernia 3. Cardiomegaly Electronically Signed   By: Donavan Foil M.D.   On: 06/28/2016 14:08   Dg Abd 2 Views  Result Date: 06/28/2016 CLINICAL DATA:  Diffuse abdominal pain  EXAM: ABDOMEN - 2 VIEW COMPARISON:  06/25/2016 FINDINGS: Scattered large and small bowel gas is noted. Diffuse dilatation of multiple small bowel loops is seen. No free air is noted. A paucity of colonic gas is seen. These changes are consistent with a at least partial small bowel obstruction. No acute bony abnormality is seen. IMPRESSION: Changes consistent with at least a partial small bowel obstruction. No free air is noted. These results will be called to the ordering clinician or representative by the Radiologist Assistant, and communication documented in the PACS or zVision Dashboard. Electronically Signed   By: Inez Catalina M.D.   On: 06/28/2016 14:06    Anti-infectives: Anti-infectives    Start     Dose/Rate Route Frequency Ordered Stop   07/01/16 0000  ciprofloxacin (CIPRO) 500 MG tablet     500 mg Oral Daily with breakfast 06/30/16 0922     06/30/16 1500  ciprofloxacin (CIPRO) tablet 500 mg     500 mg Oral Every 24 hours 06/30/16 0921     06/30/16 0915  ciprofloxacin (CIPRO) tablet 250 mg  Status:  Discontinued     250 mg Oral 2 times daily 06/30/16 0914 06/30/16 0921   06/25/16 0630  meropenem (MERREM) IVPB SOLR 500 mg  Status:  Discontinued     500 mg 100 mL/hr over 30 Minutes Intravenous Every 12 hours 06/25/16 0620 06/30/16 0914   06/25/16 0145  ciprofloxacin (CIPRO) IVPB 400 mg     400 mg 200 mL/hr over 60 Minutes Intravenous  Once 06/25/16 0134 06/25/16 0413  06/25/16 0145  metroNIDAZOLE (FLAGYL) IVPB 500 mg     500 mg 100 mL/hr over 60 Minutes Intravenous  Once 06/25/16 0134 06/25/16 0413      Assessment/Plan:  82 year old male admitted initially with cholecystitis. He then developed an ileus which appears to be resolving or resolved. No plans for any surgical intervention at this time. Patient is able to tolerate a diet he may transition to oral antibiotics to complete a 10 day course of antibiotics for the cholecystitis. Please call surgery again if we can be of further  assistance with this patient's management.  Calypso Hagarty T. Adonis Huguenin, MD, FACS  06/30/2016

## 2016-06-30 NOTE — Discharge Summary (Signed)
[name] NAME: [name]    MR#:  [id number]  [place]:  1927-06-14  DATE OF ADMISSION:  06/24/2016   ADMITTING PHYSICIAN: Harrie Foreman, MD  DATE OF DISCHARGE: 06/30/2016  1:12 PM  PRIMARY CARE PHYSICIAN: Vista Mink, FNP   ADMISSION DIAGNOSIS:  Acute cholecystitis [K81.0] Tachypnea [R06.82] Lactic acidosis [E87.2] Cholecystitis [K81.9] Abdominal pain [R10.9] Abdominal pain, unspecified abdominal location [R10.9] Sepsis, due to unspecified organism ([name]) [A41.9] Hypotension, unspecified hypotension type [I95.9] DISCHARGE DIAGNOSIS:  Active Problems:   Hematuria   Sepsis ([name]) Sepsis Secondary to cholecystitis Acute metabolic encephalopathy [place] A. fib with RVR Thrombocytopenia SECONDARY DIAGNOSIS:   Past Medical History:  Diagnosis Date  . Cardiac disease   . CHF (congestive heart failure) ([name])   . Hypercholesteremia   . Hypertension   . Kidney failure    left  . Osteoporosis   . Prostate cancer ([name])   . Shingles   . Skin cancer    right cheek   HOSPITAL COURSE:  This is an 82 year old male admitted for sepsis secondary to cholecystitis. 1. Sepsis Secondary to cholecystitis;  HIDA scan with no obstruction however clinically cholecystitis, improved. Continue Cipro for 5 more days.  Ilieus. He was onn nothing by mouth except medication, started full liquid diet and tolerated heart healthy diet today. He had good bowel movements.  2. A. fib with RVR heart rate heart rate,  stable echo reviewed showed some diastolic dysfunction hr stable continue metoprolol and aspirin.  3. Chronic kidney disease: Stage IV; stable. 4. CHF: Systolic; chronic, stable. resume  torsemide and  continue metoprolol per home regimen, cxr without chf or pna 5. Hypertension: hold hydralazine and imdur due to low blood pressure. 6. Acute metabolic encephalopathy suspected due to acute delirium as  result of hospitalization supportive care now improved 7. Hypothyroidism: Continue Synthroid 8. Prostate cancer: Recent Lupron injection. Continue tamsulosin and Cardura for urinary retention 9. Hyperlipidemia: Continue statin therapy 10. DVT prophylaxis: Heparin 11. GI prophylaxis: Pantoprazole per home regimen  Thrombocytopenia. Hold heparin due to worsening thrombocytopenia. Large hiatal hernia.  DISCHARGE CONDITIONS:  Stable, discharged to home with home health and PT today. CONSULTS OBTAINED:  Treatment Team:  Florene Glen, MD Dionisio David, MD DRUG ALLERGIES:   Allergies  Allergen Reactions  . Ativan [Lorazepam] Other (See Comments)    Hallucinations, combative  . Penicillin G Other (See Comments)    Has patient had a PCN reaction causing immediate rash, facial/tongue/throat swelling, SOB or lightheadedness with hypotension: Yes Has patient had a PCN reaction causing severe rash involving mucus membranes or skin necrosis: No Has patient had a PCN reaction that required hospitalization No Has patient had a PCN reaction occurring within the last 10 years: No If all of the above answers are "NO", then may proceed with Cephalosporin use.    DISCHARGE MEDICATIONS:   Allergies as of 06/30/2016      Reactions   Ativan [lorazepam] Other (See Comments)   Hallucinations, combative   Penicillin G Other (See Comments)   Has patient had a PCN reaction causing immediate rash, facial/tongue/throat swelling, SOB or lightheadedness with hypotension: Yes Has patient had a PCN reaction causing severe rash involving mucus membranes or skin necrosis: No Has patient had a PCN reaction that required hospitalization No Has patient had a PCN reaction occurring within the last 10 years: No If all of the above answers are "NO", then may proceed with Cephalosporin  use.      Medication List    STOP taking these medications   hydrALAZINE 25 MG tablet Commonly known as:  APRESOLINE     isosorbide mononitrate 30 MG 24 hr tablet Commonly known as:  IMDUR   losartan 100 MG tablet Commonly known as:  COZAAR     TAKE these medications   albuterol 108 (90 Base) MCG/ACT inhaler Commonly known as:  PROVENTIL HFA;VENTOLIN HFA Inhale 2 puffs into the lungs every 6 (six) hours as needed for wheezing or shortness of breath.   aspirin EC 81 MG tablet Take 81 mg by mouth at bedtime.   atorvastatin 40 MG tablet Commonly known as:  LIPITOR Take 40 mg by mouth at bedtime.   ciprofloxacin 500 MG tablet Commonly known as:  CIPRO Take 1 tablet (500 mg total) by mouth daily with breakfast. Start taking on:  07/01/2016   doxazosin 4 MG tablet Commonly known as:  CARDURA Take 4 mg by mouth at bedtime.   ferrous sulfate 325 (65 FE) MG tablet Take 325 mg by mouth 2 (two) times daily.   levothyroxine 100 MCG tablet Commonly known as:  SYNTHROID, LEVOTHROID Take 100 mcg by mouth daily.   metoprolol succinate 50 MG 24 hr tablet Commonly known as:  TOPROL-XL Take 50 mg by mouth daily.   nitroGLYCERIN 0.4 MG SL tablet Commonly known as:  NITROSTAT Place 0.4 mg under the tongue every 5 (five) minutes as needed for chest pain.   omega-3 acid ethyl esters 1 g capsule Commonly known as:  LOVAZA Take 1 capsule by mouth 2 (two) times daily.   pantoprazole 40 MG tablet Commonly known as:  PROTONIX Take 40 mg by mouth 2 (two) times daily.   potassium chloride SA 20 MEQ tablet Commonly known as:  K-DUR,KLOR-CON Take 20 mEq by mouth 2 (two) times daily.   sodium bicarbonate 650 MG tablet Take 1,300 mg by mouth 2 (two) times daily.   tamsulosin 0.4 MG Caps capsule Commonly known as:  FLOMAX Take 0.4 mg by mouth daily.   torsemide 20 MG tablet Commonly known as:  DEMADEX Take 20 mg by mouth 2 (two) times daily.   Vitamin D3 5000 units Tabs Take 5,000 Units by mouth daily.        DISCHARGE INSTRUCTIONS:  See AVS.  If you experience worsening of your admission  symptoms, develop shortness of breath, life threatening emergency, suicidal or homicidal thoughts you must seek medical attention immediately by calling 911 or calling your MD immediately  if symptoms less severe.  You Must read complete instructions/literature along with all the possible adverse reactions/side effects for all the Medicines you take and that have been prescribed to you. Take any new Medicines after you have completely understood and accpet all the possible adverse reactions/side effects.   Please note  You were cared for by a hospitalist during your hospital stay. If you have any questions about your discharge medications or the care you received while you were in the hospital after you are discharged, you can call the unit and asked to speak with the hospitalist on call if the hospitalist that took care of you is not available. Once you are discharged, your primary care physician will handle any further medical issues. Please note that NO REFILLS for any discharge medications will be authorized once you are discharged, as it is imperative that you return to your primary care physician (or establish a relationship with a primary care physician if you do  not have one) for your aftercare needs so that they can reassess your need for medications and monitor your lab values.    On the day of Discharge:  VITAL SIGNS:  Blood pressure (!) 151/55, pulse 74, temperature 98.8 F (37.1 C), temperature source Oral, resp. rate 18, height 5\' 8"  (1.727 m), weight 167 lb (75.8 kg), SpO2 97 %. PHYSICAL EXAMINATION:  GENERAL:  82 y.o.-year-old patient lying in the bed with no acute distress.  EYES: Pupils equal, round, reactive to light and accommodation. No scleral icterus. Extraocular muscles intact.  HEENT: Head atraumatic, normocephalic. Oropharynx and nasopharynx clear.  NECK:  Supple, no jugular venous distention. No thyroid enlargement, no tenderness.  LUNGS: Normal breath sounds  bilaterally, no wheezing, rales,rhonchi or crepitation. No use of accessory muscles of respiration.  CARDIOVASCULAR: S1, S2 normal. No murmurs, rubs, or gallops.  ABDOMEN: Soft, non-tender, non-distended. Bowel sounds present. No organomegaly or mass.  EXTREMITIES: No pedal edema, cyanosis, or clubbing.  NEUROLOGIC: Cranial nerves II through XII are intact. Muscle strength 4/5 in all extremities. Sensation intact. Gait not checked.  PSYCHIATRIC: The patient is alert and oriented x 3.  SKIN: No obvious rash, lesion, or ulcer.  DATA REVIEW:   CBC  Recent Labs Lab 06/28/16 0533  WBC 6.1  HGB 11.0*  HCT 31.9*  PLT 79*    Chemistries   Recent Labs Lab 06/25/16 0009  06/29/16 0508  NA 133*  < > 137  K 3.9  < > 3.5  CL 103  < > 108  CO2 20*  < > 23  GLUCOSE 101*  < > 124*  BUN 35*  < > 41*  CREATININE 3.10*  < > 2.79*  CALCIUM 8.8*  < > 8.1*  MG  --   --  2.0  AST 26  --   --   ALT 15*  --   --   ALKPHOS 42  --   --   BILITOT 1.5*  --   --   < > = values in this interval not displayed.   Microbiology Results  Results for orders placed or performed during the hospital encounter of 06/24/16  Blood culture (routine x 2)     Status: None   Collection Time: 06/25/16  2:08 AM  Result Value Ref Range Status   Specimen Description BLOOD LEFT ANTECUBITAL  Final   Special Requests   Final    BOTTLES DRAWN AEROBIC AND ANAEROBIC [place]   Culture NO GROWTH 5 DAYS  Final   Report Status 06/30/2016 FINAL  Final  Blood culture (routine x 2)     Status: None   Collection Time: 06/25/16  2:09 AM  Result Value Ref Range Status   Specimen Description BLOOD LEFT WRIST  Final   Special Requests BOTTLES DRAWN AEROBIC AND ANAEROBIC 2CCAERO,3CCANA  Final   Culture NO GROWTH 5 DAYS  Final   Report Status 06/30/2016 FINAL  Final    RADIOLOGY:  No results found.   Management plans discussed with the patient, His wife and they are in agreement.  CODE STATUS:     Code  Status Orders        Start     Ordered   06/25/16 0616  Full code  Continuous     06/25/16 0615    Code Status History    Date Active Date Inactive Code Status Order ID Comments User Context   07/20/2015 12:00 PM 07/23/2015  3:48 PM Full Code [id number]  Epifanio Lesches, MD  ED   06/03/2015  3:17 PM 06/07/2015 12:18 PM Full Code [id number]  Bettey Costa, MD Inpatient      TOTAL TIME TAKING CARE OF THIS PATIENT: 36 minutes.    Demetrios Loll M.D on 06/30/2016 at 1:19 PM  Between 7am to 6pm - Pager - [phone]  After 6pm go to [email or web address] - Proofreader  [facility]  Office  [phone]  CC: Primary care physician; Vista Mink, FNP   Note: This dictation was prepared with Dragon dictation along with smaller phrase technology. Any transcriptional errors that result from this process are unintentional.

## 2016-07-01 NOTE — Care Management Note (Signed)
Case Management Note  Patient Details  Name: [name] MRN: [id number] Date of Birth: October 12, 1926  Subjective/Objective:   A referral for HH=PT and RN was faxed to [facility] per patient choice.                 Action/Plan:   Expected Discharge Date:  06/27/16               Expected Discharge Plan:     In-House Referral:     Discharge planning Services     Post Acute Care Choice:    Choice offered to:     DME Arranged:    DME Agency:     HH Arranged:    HH Agency:     Status of Service:     If discussed at [employer], dates discussed:    Additional Comments:  [name] A, RN 07/01/2016, 8:34 AM

## 2016-07-19 ENCOUNTER — Encounter: Payer: Self-pay | Admitting: Emergency Medicine

## 2016-07-19 ENCOUNTER — Inpatient Hospital Stay
Admission: EM | Admit: 2016-07-19 | Discharge: 2016-07-23 | DRG: 871 | Disposition: A | Discharge status: Home Health | Payer: Medicare Other | Attending: Internal Medicine | Admitting: Internal Medicine

## 2016-07-19 ENCOUNTER — Emergency Department: Payer: Medicare Other

## 2016-07-19 DIAGNOSIS — I480 Paroxysmal atrial fibrillation: Secondary | ICD-10-CM | POA: Diagnosis present

## 2016-07-19 DIAGNOSIS — Z85828 Personal history of other malignant neoplasm of skin: Secondary | ICD-10-CM

## 2016-07-19 DIAGNOSIS — Z8546 Personal history of malignant neoplasm of prostate: Secondary | ICD-10-CM

## 2016-07-19 DIAGNOSIS — K810 Acute cholecystitis: Secondary | ICD-10-CM | POA: Diagnosis present

## 2016-07-19 DIAGNOSIS — Z888 Allergy status to other drugs, medicaments and biological substances status: Secondary | ICD-10-CM

## 2016-07-19 DIAGNOSIS — K219 Gastro-esophageal reflux disease without esophagitis: Secondary | ICD-10-CM | POA: Diagnosis present

## 2016-07-19 DIAGNOSIS — E871 Hypo-osmolality and hyponatremia: Secondary | ICD-10-CM | POA: Diagnosis present

## 2016-07-19 DIAGNOSIS — M6281 Muscle weakness (generalized): Secondary | ICD-10-CM

## 2016-07-19 DIAGNOSIS — E876 Hypokalemia: Secondary | ICD-10-CM | POA: Diagnosis not present

## 2016-07-19 DIAGNOSIS — A419 Sepsis, unspecified organism: Principal | ICD-10-CM | POA: Diagnosis present

## 2016-07-19 DIAGNOSIS — R059 Cough, unspecified: Secondary | ICD-10-CM

## 2016-07-19 DIAGNOSIS — K822 Perforation of gallbladder: Secondary | ICD-10-CM | POA: Diagnosis present

## 2016-07-19 DIAGNOSIS — Z7982 Long term (current) use of aspirin: Secondary | ICD-10-CM

## 2016-07-19 DIAGNOSIS — N179 Acute kidney failure, unspecified: Secondary | ICD-10-CM

## 2016-07-19 DIAGNOSIS — N400 Enlarged prostate without lower urinary tract symptoms: Secondary | ICD-10-CM | POA: Diagnosis present

## 2016-07-19 DIAGNOSIS — L02211 Cutaneous abscess of abdominal wall: Secondary | ICD-10-CM | POA: Diagnosis present

## 2016-07-19 DIAGNOSIS — N185 Chronic kidney disease, stage 5: Secondary | ICD-10-CM | POA: Diagnosis present

## 2016-07-19 DIAGNOSIS — R262 Difficulty in walking, not elsewhere classified: Secondary | ICD-10-CM

## 2016-07-19 DIAGNOSIS — I959 Hypotension, unspecified: Secondary | ICD-10-CM | POA: Diagnosis present

## 2016-07-19 DIAGNOSIS — R05 Cough: Secondary | ICD-10-CM

## 2016-07-19 DIAGNOSIS — Z880 Allergy status to penicillin: Secondary | ICD-10-CM

## 2016-07-19 DIAGNOSIS — I5032 Chronic diastolic (congestive) heart failure: Secondary | ICD-10-CM | POA: Diagnosis present

## 2016-07-19 DIAGNOSIS — K819 Cholecystitis, unspecified: Secondary | ICD-10-CM | POA: Diagnosis present

## 2016-07-19 DIAGNOSIS — M810 Age-related osteoporosis without current pathological fracture: Secondary | ICD-10-CM | POA: Diagnosis present

## 2016-07-19 DIAGNOSIS — F17220 Nicotine dependence, chewing tobacco, uncomplicated: Secondary | ICD-10-CM | POA: Diagnosis present

## 2016-07-19 DIAGNOSIS — Z79899 Other long term (current) drug therapy: Secondary | ICD-10-CM

## 2016-07-19 DIAGNOSIS — E7800 Pure hypercholesterolemia, unspecified: Secondary | ICD-10-CM | POA: Diagnosis present

## 2016-07-19 DIAGNOSIS — E039 Hypothyroidism, unspecified: Secondary | ICD-10-CM | POA: Diagnosis present

## 2016-07-19 DIAGNOSIS — I132 Hypertensive heart and chronic kidney disease with heart failure and with stage 5 chronic kidney disease, or end stage renal disease: Secondary | ICD-10-CM | POA: Diagnosis present

## 2016-07-19 LAB — COMPREHENSIVE METABOLIC PANEL
ALT: 16 U/L — ABNORMAL LOW (ref 17–63)
AST: 24 U/L (ref 15–41)
Albumin: 3.1 g/dL — ABNORMAL LOW (ref 3.5–5.0)
Alkaline Phosphatase: 60 U/L (ref 38–126)
Anion gap: 12 (ref 5–15)
BILIRUBIN TOTAL: 1.1 mg/dL (ref 0.3–1.2)
BUN: 56 mg/dL — ABNORMAL HIGH (ref 6–20)
CHLORIDE: 96 mmol/L — AB (ref 101–111)
CO2: 22 mmol/L (ref 22–32)
CREATININE: 3.89 mg/dL — AB (ref 0.61–1.24)
Calcium: 8.4 mg/dL — ABNORMAL LOW (ref 8.9–10.3)
GFR calc Af Amer: 15 mL/min — ABNORMAL LOW (ref >60)
GFR, EST NON AFRICAN AMERICAN: 13 mL/min — AB (ref >60)
Glucose, Bld: 121 mg/dL — ABNORMAL HIGH (ref 65–99)
Potassium: 4.5 mmol/L (ref 3.5–5.1)
Sodium: 130 mmol/L — ABNORMAL LOW (ref 135–145)
Total Protein: 6.7 g/dL (ref 6.5–8.1)

## 2016-07-19 LAB — URINALYSIS, COMPLETE (UACMP) WITH MICROSCOPIC
Bilirubin Urine: NEGATIVE
Glucose, UA: 50 mg/dL — AB
Ketones, ur: NEGATIVE mg/dL
LEUKOCYTES UA: NEGATIVE
Nitrite: NEGATIVE
Protein, ur: 30 mg/dL — AB
SPECIFIC GRAVITY, URINE: 1.01 (ref 1.005–1.030)
pH: 5 (ref 5.0–8.0)

## 2016-07-19 LAB — CBC
HCT: 34.9 % — ABNORMAL LOW (ref 40.0–52.0)
Hemoglobin: 11.7 g/dL — ABNORMAL LOW (ref 13.0–18.0)
MCH: 29.4 pg (ref 26.0–34.0)
MCHC: 33.6 g/dL (ref 32.0–36.0)
MCV: 87.5 fL (ref 80.0–100.0)
Platelets: 151 10*3/uL (ref 150–440)
RBC: 3.99 MIL/uL — ABNORMAL LOW (ref 4.40–5.90)
RDW: 15 % — AB (ref 11.5–14.5)
WBC: 12.1 10*3/uL — AB (ref 3.8–10.6)

## 2016-07-19 LAB — LACTIC ACID, PLASMA: LACTIC ACID, VENOUS: 1.6 mmol/L (ref 0.5–1.9)

## 2016-07-19 LAB — INFLUENZA PANEL BY PCR (TYPE A & B)
INFLAPCR: NEGATIVE
Influenza B By PCR: NEGATIVE

## 2016-07-19 LAB — LIPASE, BLOOD: LIPASE: 19 U/L (ref 11–51)

## 2016-07-19 LAB — TROPONIN I: TROPONIN I: 0.04 ng/mL — AB (ref <0.03)

## 2016-07-19 MED ORDER — MEROPENEM-SODIUM CHLORIDE 500 MG/50ML IV SOLR
500.0000 mg | Freq: Once | INTRAVENOUS | Status: AC
  Administered 2016-07-19: 500 mg via INTRAVENOUS
  Filled 2016-07-19 (×3): qty 50

## 2016-07-19 MED ORDER — SODIUM CHLORIDE 0.9 % IV BOLUS (SEPSIS)
1000.0000 mL | Freq: Once | INTRAVENOUS | Status: AC
  Administered 2016-07-19: 1000 mL via INTRAVENOUS

## 2016-07-19 MED ORDER — VANCOMYCIN HCL IN DEXTROSE 1-5 GM/200ML-% IV SOLN
1000.0000 mg | Freq: Once | INTRAVENOUS | Status: AC
  Administered 2016-07-19: 1000 mg via INTRAVENOUS
  Filled 2016-07-19: qty 200

## 2016-07-19 MED ORDER — IOPAMIDOL (ISOVUE-300) INJECTION 61%
30.0000 mL | Freq: Once | INTRAVENOUS | Status: AC
  Administered 2016-07-19: 30 mL via ORAL

## 2016-07-19 NOTE — ED Provider Notes (Signed)
[facility] Emergency Department Provider Note ____________________________________________   I have reviewed the triage vital signs and the triage nursing note.  HISTORY  Chief Complaint Hypotension and Abdominal Pain   Historian Patient with wife  HPI ABDIAS HICKAM is a 83 y.o. male came in from home after home health nurse checked him today and wife states they were told that he might have a fever, and that his blood pressure was low and that he should come in for evaluation.  Patient states that 2 days ago he started feeling exhaustion and was sleeping a lot, and wife states he had poor by mouth intake although no nausea, vomiting, or diarrhea. He states that he occasionally will get abdominal pains that move around, although currently he has none. He does have a mild cough although reports no trouble breathing or chest pain.  He was recently in the hospital in December for sepsis and cholecystitis. Patient states that he had done well at home until about 2 days ago.  Symptoms are mild to moderate. Nothing seems to make it worse or better.    Past Medical History:  Diagnosis Date  . Cardiac disease   . CHF (congestive heart failure) ([place])   . Hypercholesteremia   . Hypertension   . Kidney failure    left  . Osteoporosis   . Prostate cancer ([place])   . Shingles   . Skin cancer    right cheek    Patient Active Problem List   Diagnosis Date Noted  . Hematuria 06/25/2016  . Sepsis ([place]) 06/25/2016  . Acute cholecystitis   . Hypokalemia 07/23/2015  . Generalized weakness 07/23/2015  . Bladder outlet obstruction 07/23/2015  . Acute delirium 07/23/2015  . Benign essential HTN 07/23/2015  . Acute on chronic renal failure ([place]) 07/20/2015  . GIB (gastrointestinal bleeding) 06/03/2015    Past Surgical History:  Procedure Laterality Date  . CARDIAC CATHETERIZATION  04/13/2014    Prior to Admission medications   Medication Sig Start Date End Date  Taking? Authorizing Provider  aspirin EC 81 MG tablet Take 81 mg by mouth at bedtime.   Yes Historical Provider, MD  atorvastatin (LIPITOR) 40 MG tablet Take 40 mg by mouth at bedtime.    Yes Historical Provider, MD  Cholecalciferol (VITAMIN D3) 5000 units TABS Take 5,000 Units by mouth daily.   Yes Historical Provider, MD  doxazosin (CARDURA) 4 MG tablet Take 4 mg by mouth at bedtime.    Yes Historical Provider, MD  ferrous sulfate 325 (65 FE) MG tablet Take 325 mg by mouth 2 (two) times daily.    Yes Historical Provider, MD  levothyroxine (SYNTHROID, LEVOTHROID) 100 MCG tablet Take 100 mcg by mouth daily.   Yes Historical Provider, MD  metoprolol succinate (TOPROL-XL) 50 MG 24 hr tablet Take 50 mg by mouth daily. Take with or immediately following a meal.   Yes Historical Provider, MD  nitroGLYCERIN (NITROSTAT) 0.4 MG SL tablet Place 0.4 mg under the tongue every 5 (five) minutes as needed for chest pain.   Yes Historical Provider, MD  omega-3 acid ethyl esters (LOVAZA) 1 G capsule Take 1 capsule by mouth 2 (two) times daily.   Yes Historical Provider, MD  pantoprazole (PROTONIX) 40 MG tablet Take 40 mg by mouth 2 (two) times daily.    Yes Historical Provider, MD  potassium chloride SA (K-DUR,KLOR-CON) 20 MEQ tablet Take 20 mEq by mouth 2 (two) times daily.  08/18/15  Yes Historical Provider, MD  sodium bicarbonate 650 MG tablet Take 1,300 mg by mouth 2 (two) times daily.    Yes Historical Provider, MD  tamsulosin (FLOMAX) 0.4 MG CAPS capsule Take 0.4 mg by mouth daily.   Yes Historical Provider, MD  torsemide (DEMADEX) 20 MG tablet Take 20 mg by mouth 2 (two) times daily.  10/27/15  Yes Historical Provider, MD    Allergies  Allergen Reactions  . Ativan [Lorazepam] Other (See Comments)    Hallucinations, combative  . Penicillin G Other (See Comments)    Has patient had a PCN reaction causing immediate rash, facial/tongue/throat swelling, SOB or lightheadedness with hypotension: Yes Has patient  had a PCN reaction causing severe rash involving mucus membranes or skin necrosis: No Has patient had a PCN reaction that required hospitalization No Has patient had a PCN reaction occurring within the last 10 years: No If all of the above answers are "NO", then may proceed with Cephalosporin use.     Family History  Problem Relation Age of Onset  . CAD Mother     Social History Social History  Substance Use Topics  . Smoking status: Former Smoker    Types: Cigarettes, Cigars  . Smokeless tobacco: Current User    Types: Chew  . Alcohol use No    Review of Systems  Constitutional: Questionable fever, wife is uncertain if they were told that he had a fever by the home health nurse or not.. Eyes: Negative for visual changes. ENT: Negative for sore throat. Cardiovascular: Negative for chest pain. Respiratory: Negative for shortness of breath.  Positive for nonproductive cough. Gastrointestinal: Negative for vomiting or diarrhea. She has had some intermittent abdominal pains upper and lower both sides, he points all over the abdomen. Genitourinary: Negative for dysuria. Musculoskeletal: Negative for back pain. Skin: Negative for rash. Neurological: Negative for headache. 10 point Review of Systems otherwise negative ____________________________________________   PHYSICAL EXAM:  VITAL SIGNS: ED Triage Vitals [07/19/16 1554]  Enc Vitals Group     BP (!) 100/53     Pulse Rate 78     Resp 18     Temp 97.7 F (36.5 C)     Temp Source Oral     SpO2 100 %     Weight 167 lb (75.8 kg)     Height 5\' 8"  (1.727 m)     Head Circumference      Peak Flow      Pain Score 3     Pain Loc      Pain Edu?      Excl. in [place]?      Constitutional: Alert and oriented. Well appearing and in no distress. HEENT   Head: Normocephalic and atraumatic.      Eyes: Conjunctivae are normal. PERRL. Normal extraocular movements.      Ears:         Nose: No congestion/rhinnorhea.    Mouth/Throat: Mucous membranes are Mildly dry.   Neck: No stridor. Cardiovascular/Chest: Normal rate, regular rhythm.  No murmurs, rubs, or gallops. Respiratory: Normal respiratory effort without tachypnea nor retractions. Moderate rhonchi both bases. No wheezing. Gastrointestinal: Soft. No distention, no guarding, no rebound. Nontender to deep and superficial palpation in 4 quadrants.  Genitourinary/rectal: Deferred Musculoskeletal: Nontender with normal range of motion in all extremities. No joint effusions.  No lower extremity tenderness.  No edema. Neurologic:  Normal speech and language. No gross or focal neurologic deficits are appreciated. Skin:  Skin is warm, dry and intact. No rash noted. Psychiatric: Mood  and affect are normal. Speech and behavior are normal. Patient exhibits appropriate insight and judgment.   ____________________________________________  LABS (pertinent positives/negatives)  Labs Reviewed  COMPREHENSIVE METABOLIC PANEL - Abnormal; Notable for the following:       Result Value   Sodium 130 (*)    Chloride 96 (*)    Glucose, Bld 121 (*)    BUN 56 (*)    Creatinine, Ser 3.89 (*)    Calcium 8.4 (*)    Albumin 3.1 (*)    ALT 16 (*)    GFR calc non Af Amer 13 (*)    GFR calc Af Amer 15 (*)    All other components within normal limits  CBC - Abnormal; Notable for the following:    WBC 12.1 (*)    RBC 3.99 (*)    Hemoglobin 11.7 (*)    HCT 34.9 (*)    RDW 15.0 (*)    All other components within normal limits  URINALYSIS, COMPLETE (UACMP) WITH MICROSCOPIC - Abnormal; Notable for the following:    Color, Urine YELLOW (*)    APPearance CLEAR (*)    Glucose, UA 50 (*)    Hgb urine dipstick SMALL (*)    Protein, ur 30 (*)    Bacteria, UA RARE (*)    Squamous Epithelial / LPF 0-5 (*)    All other components within normal limits  TROPONIN I - Abnormal; Notable for the following:    Troponin I 0.04 (*)    All other components within normal limits   URINE CULTURE  CULTURE, BLOOD (ROUTINE X 2)  CULTURE, BLOOD (ROUTINE X 2)  RAPID INFLUENZA A&B ANTIGENS ([facility] ONLY)  LIPASE, BLOOD  LACTIC ACID, PLASMA  INFLUENZA PANEL BY PCR (TYPE A & B, H1N1)  LACTIC ACID, PLASMA    ____________________________________________    EKG I, Lisa Roca, MD, the attending physician have personally viewed and interpreted all ECGs.  70 bpm. Normal sinus rhythm. Right bundle blanch block. Nonspecific T wave. ____________________________________________  RADIOLOGY All Xrays were viewed by me. Imaging interpreted by Radiologist.  Portable chest x-ray:   IMPRESSION: 1. Mild cardiomegaly without overt failure. No acute infiltrate 2. Moderate large hiatal hernia  CT abdomen and pelvis without IV and with by mouth contrast: Pending __________________________________________  PROCEDURES  Procedure(s) performed: None  Critical Care performed: None  ____________________________________________   ED COURSE / ASSESSMENT AND PLAN  Pertinent labs & imaging results that were available during my care of the patient were reviewed by me and considered in my medical decision making (see chart for details).  Mr. Luther Hearing came in after being told to come for evaluation by home health care staff for reportedly low blood pressure and possibly fever.  Here he had an extended wait in the emergency department waiting room, but no fevers documented here. His blood pressure was in the upper 90s or low 100s when I viewed the vital sign flow sheet.  The patient himself is overall well-appearing although states he has had decreased by mouth intake and that may account for his hypotension being related to dehydration as his BUN and creatinine are slightly worse than his baseline.  I'm starting him on 1 L normal saline.  However given the report of fever, and elevated white blood cell count, with hypotension, I have some concern for possibility of sepsis and will go  ahead and add on other sources of infection including chest x-ray, and influenza. Although he stated he had some intermittent mild abdominal  pains, he points all over as if it was moving, he has no tenderness now I don't have a high suspicion for intra-abdominal emergency.  In any case, he was recently in the hospital for cholecystitis and sepsis. I'm covering him for possible sepsis with vancomycin and meropenem. There is a reported penicillin allergy, but patient received meropenem in the hospital previously with no indication of intolerance.  Chest x-ray without focal infiltrate. I will obtain CT scan of the abdomen and as that was his initial complaint although he seems improved now.  CT scan pending at time of transfer of care, 11:30 PM to Dr. Owens Shark. When CT results, patient admitted to hospitalist.    CONSULTATIONS:   Hospitalist for admission, spoke with Dr. Jannifer Franklin initially but Dr. Owens Shark will officially consult hospitalist after CT scan is reviewed.   Patient / Family / Caregiver informed of clinical course, medical decision-making process, and agree with plan.  ___________________________________________   FINAL CLINICAL IMPRESSION(S) / ED DIAGNOSES   Final diagnoses:  Sepsis, due to unspecified organism ([place])  Acute renal failure, unspecified acute renal failure type [facility])              Note: This dictation was prepared with Dragon dictation. Any transcriptional errors that result from this process are unintentional    Lisa Roca, MD 07/19/16 2328

## 2016-07-19 NOTE — ED Triage Notes (Signed)
Patient to ER for c/o low BP per home health RN. Patient states he was recently in hospital for gallbaldder infection (was not candidate for surgery). Patient states he has continued to have generalized abd discomfort, but feels it may have been slightly worse intermittently. Patient denies any flu like symptoms.

## 2016-07-20 ENCOUNTER — Inpatient Hospital Stay: Payer: Medicare Other

## 2016-07-20 ENCOUNTER — Emergency Department: Payer: Medicare Other

## 2016-07-20 DIAGNOSIS — K822 Perforation of gallbladder: Secondary | ICD-10-CM | POA: Diagnosis present

## 2016-07-20 DIAGNOSIS — I5032 Chronic diastolic (congestive) heart failure: Secondary | ICD-10-CM | POA: Diagnosis present

## 2016-07-20 DIAGNOSIS — E7800 Pure hypercholesterolemia, unspecified: Secondary | ICD-10-CM | POA: Diagnosis present

## 2016-07-20 DIAGNOSIS — Z880 Allergy status to penicillin: Secondary | ICD-10-CM | POA: Diagnosis not present

## 2016-07-20 DIAGNOSIS — I480 Paroxysmal atrial fibrillation: Secondary | ICD-10-CM | POA: Diagnosis present

## 2016-07-20 DIAGNOSIS — F17220 Nicotine dependence, chewing tobacco, uncomplicated: Secondary | ICD-10-CM | POA: Diagnosis present

## 2016-07-20 DIAGNOSIS — E876 Hypokalemia: Secondary | ICD-10-CM | POA: Diagnosis not present

## 2016-07-20 DIAGNOSIS — Z79899 Other long term (current) drug therapy: Secondary | ICD-10-CM | POA: Diagnosis not present

## 2016-07-20 DIAGNOSIS — L02211 Cutaneous abscess of abdominal wall: Secondary | ICD-10-CM | POA: Diagnosis present

## 2016-07-20 DIAGNOSIS — N179 Acute kidney failure, unspecified: Secondary | ICD-10-CM | POA: Diagnosis present

## 2016-07-20 DIAGNOSIS — Z7982 Long term (current) use of aspirin: Secondary | ICD-10-CM | POA: Diagnosis not present

## 2016-07-20 DIAGNOSIS — N185 Chronic kidney disease, stage 5: Secondary | ICD-10-CM | POA: Diagnosis present

## 2016-07-20 DIAGNOSIS — Z85828 Personal history of other malignant neoplasm of skin: Secondary | ICD-10-CM | POA: Diagnosis not present

## 2016-07-20 DIAGNOSIS — E871 Hypo-osmolality and hyponatremia: Secondary | ICD-10-CM | POA: Diagnosis present

## 2016-07-20 DIAGNOSIS — A419 Sepsis, unspecified organism: Secondary | ICD-10-CM | POA: Diagnosis present

## 2016-07-20 DIAGNOSIS — I959 Hypotension, unspecified: Secondary | ICD-10-CM | POA: Diagnosis present

## 2016-07-20 DIAGNOSIS — N400 Enlarged prostate without lower urinary tract symptoms: Secondary | ICD-10-CM | POA: Diagnosis present

## 2016-07-20 DIAGNOSIS — Z888 Allergy status to other drugs, medicaments and biological substances status: Secondary | ICD-10-CM | POA: Diagnosis not present

## 2016-07-20 DIAGNOSIS — K219 Gastro-esophageal reflux disease without esophagitis: Secondary | ICD-10-CM | POA: Diagnosis present

## 2016-07-20 DIAGNOSIS — M810 Age-related osteoporosis without current pathological fracture: Secondary | ICD-10-CM | POA: Diagnosis present

## 2016-07-20 DIAGNOSIS — K819 Cholecystitis, unspecified: Secondary | ICD-10-CM | POA: Diagnosis present

## 2016-07-20 DIAGNOSIS — Z8546 Personal history of malignant neoplasm of prostate: Secondary | ICD-10-CM | POA: Diagnosis not present

## 2016-07-20 DIAGNOSIS — I132 Hypertensive heart and chronic kidney disease with heart failure and with stage 5 chronic kidney disease, or end stage renal disease: Secondary | ICD-10-CM | POA: Diagnosis present

## 2016-07-20 DIAGNOSIS — E039 Hypothyroidism, unspecified: Secondary | ICD-10-CM | POA: Diagnosis present

## 2016-07-20 DIAGNOSIS — K810 Acute cholecystitis: Secondary | ICD-10-CM

## 2016-07-20 HISTORY — PX: CT PERC CHOLECYSTOSTOMY: HXRAD817

## 2016-07-20 LAB — PROTIME-INR
INR: 1.11
Prothrombin Time: 14.3 seconds (ref 11.4–15.2)

## 2016-07-20 LAB — TROPONIN I
Troponin I: 0.03 ng/mL (ref <0.03)
Troponin I: 0.03 ng/mL (ref <0.03)
Troponin I: 0.03 ng/mL (ref <0.03)

## 2016-07-20 LAB — TSH: TSH: 0.592 u[IU]/mL (ref 0.350–4.500)

## 2016-07-20 MED ORDER — ASPIRIN EC 81 MG PO TBEC
81.0000 mg | DELAYED_RELEASE_TABLET | Freq: Every day | ORAL | Status: DC
  Administered 2016-07-20 – 2016-07-22 (×3): 81 mg via ORAL
  Filled 2016-07-20 (×3): qty 1

## 2016-07-20 MED ORDER — TORSEMIDE 20 MG PO TABS
20.0000 mg | ORAL_TABLET | Freq: Two times a day (BID) | ORAL | Status: DC
  Administered 2016-07-20 – 2016-07-23 (×7): 20 mg via ORAL
  Filled 2016-07-20 (×8): qty 1

## 2016-07-20 MED ORDER — ACETAMINOPHEN 325 MG PO TABS
650.0000 mg | ORAL_TABLET | Freq: Four times a day (QID) | ORAL | Status: DC | PRN
  Administered 2016-07-20 – 2016-07-21 (×3): 650 mg via ORAL
  Filled 2016-07-20 (×3): qty 2

## 2016-07-20 MED ORDER — OMEGA-3-ACID ETHYL ESTERS 1 G PO CAPS
1.0000 | ORAL_CAPSULE | Freq: Two times a day (BID) | ORAL | Status: DC
  Administered 2016-07-20 – 2016-07-23 (×7): 1 g via ORAL
  Filled 2016-07-20 (×7): qty 1

## 2016-07-20 MED ORDER — LEVOTHYROXINE SODIUM 50 MCG PO TABS
100.0000 ug | ORAL_TABLET | Freq: Every day | ORAL | Status: DC
  Administered 2016-07-20 – 2016-07-23 (×4): 100 ug via ORAL
  Filled 2016-07-20 (×4): qty 2

## 2016-07-20 MED ORDER — VITAMIN D 1000 UNITS PO TABS
5000.0000 [IU] | ORAL_TABLET | Freq: Every day | ORAL | Status: DC
  Administered 2016-07-20 – 2016-07-23 (×4): 5000 [IU] via ORAL
  Filled 2016-07-20 (×3): qty 5

## 2016-07-20 MED ORDER — MIDAZOLAM HCL 5 MG/5ML IJ SOLN
INTRAMUSCULAR | Status: AC
  Filled 2016-07-20: qty 10

## 2016-07-20 MED ORDER — SODIUM CHLORIDE 0.9% FLUSH
3.0000 mL | Freq: Two times a day (BID) | INTRAVENOUS | Status: DC
  Administered 2016-07-20 – 2016-07-23 (×6): 3 mL via INTRAVENOUS

## 2016-07-20 MED ORDER — FERROUS SULFATE 325 (65 FE) MG PO TABS
325.0000 mg | ORAL_TABLET | Freq: Two times a day (BID) | ORAL | Status: DC
  Administered 2016-07-20 – 2016-07-23 (×7): 325 mg via ORAL
  Filled 2016-07-20 (×7): qty 1

## 2016-07-20 MED ORDER — POTASSIUM CHLORIDE CRYS ER 20 MEQ PO TBCR
20.0000 meq | EXTENDED_RELEASE_TABLET | Freq: Two times a day (BID) | ORAL | Status: DC
  Administered 2016-07-20 (×2): 20 meq via ORAL
  Filled 2016-07-20 (×3): qty 1

## 2016-07-20 MED ORDER — MIDAZOLAM HCL 5 MG/5ML IJ SOLN
INTRAMUSCULAR | Status: AC | PRN
  Administered 2016-07-20 (×2): 0.5 mg via INTRAVENOUS

## 2016-07-20 MED ORDER — FENTANYL CITRATE (PF) 100 MCG/2ML IJ SOLN
INTRAMUSCULAR | Status: AC
  Filled 2016-07-20: qty 4

## 2016-07-20 MED ORDER — SODIUM CHLORIDE 0.9 % IV SOLN
INTRAVENOUS | Status: DC
  Administered 2016-07-20 (×2): via INTRAVENOUS

## 2016-07-20 MED ORDER — MEROPENEM-SODIUM CHLORIDE 500 MG/50ML IV SOLR
500.0000 mg | Freq: Two times a day (BID) | INTRAVENOUS | Status: DC
  Administered 2016-07-21 – 2016-07-22 (×3): 500 mg via INTRAVENOUS
  Filled 2016-07-20 (×6): qty 50

## 2016-07-20 MED ORDER — TAMSULOSIN HCL 0.4 MG PO CAPS
0.4000 mg | ORAL_CAPSULE | Freq: Every day | ORAL | Status: DC
  Administered 2016-07-20 – 2016-07-23 (×4): 0.4 mg via ORAL
  Filled 2016-07-20 (×4): qty 1

## 2016-07-20 MED ORDER — ONDANSETRON HCL 4 MG PO TABS: 4.0000 mg | ORAL_TABLET | Freq: Four times a day (QID) | ORAL | Status: DC | PRN

## 2016-07-20 MED ORDER — SODIUM CHLORIDE 0.9 % IV SOLN: 500.0000 mg | Freq: Two times a day (BID) | INTRAVENOUS | Status: DC

## 2016-07-20 MED ORDER — ONDANSETRON HCL 4 MG/2ML IJ SOLN
4.0000 mg | Freq: Four times a day (QID) | INTRAMUSCULAR | Status: DC | PRN
Start: 2016-07-20 — End: 2016-07-23

## 2016-07-20 MED ORDER — BOOST / RESOURCE BREEZE PO LIQD
1.0000 | Freq: Three times a day (TID) | ORAL | Status: DC
  Administered 2016-07-20 – 2016-07-23 (×5): 1 via ORAL

## 2016-07-20 MED ORDER — DOCUSATE SODIUM 100 MG PO CAPS
100.0000 mg | ORAL_CAPSULE | Freq: Two times a day (BID) | ORAL | Status: DC
  Administered 2016-07-20 – 2016-07-23 (×7): 100 mg via ORAL
  Filled 2016-07-20 (×7): qty 1

## 2016-07-20 MED ORDER — METOPROLOL SUCCINATE ER 50 MG PO TB24
50.0000 mg | ORAL_TABLET | Freq: Every day | ORAL | Status: DC
  Administered 2016-07-20 – 2016-07-23 (×4): 50 mg via ORAL
  Filled 2016-07-20 (×5): qty 1

## 2016-07-20 MED ORDER — SODIUM BICARBONATE 650 MG PO TABS
1300.0000 mg | ORAL_TABLET | Freq: Two times a day (BID) | ORAL | Status: DC
  Administered 2016-07-20 – 2016-07-23 (×7): 1300 mg via ORAL
  Filled 2016-07-20 (×7): qty 2

## 2016-07-20 MED ORDER — ATORVASTATIN CALCIUM 20 MG PO TABS
40.0000 mg | ORAL_TABLET | Freq: Every day | ORAL | Status: DC
  Administered 2016-07-20 – 2016-07-22 (×3): 40 mg via ORAL
  Filled 2016-07-20 (×3): qty 2

## 2016-07-20 MED ORDER — ACETAMINOPHEN 650 MG RE SUPP: 650.0000 mg | Freq: Four times a day (QID) | RECTAL | Status: DC | PRN

## 2016-07-20 MED ORDER — NITROGLYCERIN 0.4 MG SL SUBL: 0.4000 mg | SUBLINGUAL_TABLET | SUBLINGUAL | Status: DC | PRN

## 2016-07-20 MED ORDER — DOXAZOSIN MESYLATE 4 MG PO TABS
4.0000 mg | ORAL_TABLET | Freq: Every day | ORAL | Status: DC
  Administered 2016-07-20 – 2016-07-22 (×3): 4 mg via ORAL
  Filled 2016-07-20 (×4): qty 1

## 2016-07-20 MED ORDER — DEXTROSE 5 % IV SOLN
1.0000 g | Freq: Three times a day (TID) | INTRAVENOUS | Status: DC
  Administered 2016-07-20: 1 g via INTRAVENOUS
  Filled 2016-07-20 (×3): qty 1

## 2016-07-20 MED ORDER — PANTOPRAZOLE SODIUM 40 MG PO TBEC
40.0000 mg | DELAYED_RELEASE_TABLET | Freq: Two times a day (BID) | ORAL | Status: DC
  Administered 2016-07-20 – 2016-07-23 (×7): 40 mg via ORAL
  Filled 2016-07-20 (×7): qty 1

## 2016-07-20 MED ORDER — FENTANYL CITRATE (PF) 100 MCG/2ML IJ SOLN
INTRAMUSCULAR | Status: AC | PRN
  Administered 2016-07-20: 25 ug via INTRAVENOUS
  Administered 2016-07-20: 12.5 ug via INTRAVENOUS

## 2016-07-20 NOTE — Procedures (Signed)
CT guided drainage of RUQ abscess and CT guided cholecystostomy tube placement.  Placed two 10 French drains.  Thick brown purulent fluid removed from gallbladder (20 ml) and abscess (80 ml).  Findings compatible with gallbladder perforation.   No immediate complication.  Attached abscess drain to suction bulb and gallbladder drain to gravity bag.  Plan to flush both drains while in hospital because the fluid is very thick.

## 2016-07-20 NOTE — Care Management Important Message (Signed)
Important Message  Patient Details  Name: [name] MRN: [id number] Date of Birth: 09/16/1926   Medicare Important Message Given:  Yes    Beverly Sessions, RN 07/20/2016, 3:50 PM

## 2016-07-20 NOTE — H&P (Signed)
[name] is an 83 y.o. male.   Chief Complaint: Abdominal pain HPI: The patient with past medical history of recent abdominal pain secondary to gallbladder dysfunction presents emergency department again complaining of abdominal pain. He had been discharged from the hospital recently with home health services who evaluated the patient the evening prior to admission and was concerned that he had strong smelling urine, relative hypotension as well as reported fever. In the emergency department the patient was afebrile. Due to his recurrent symptoms surgical service was consulted who recommended percutaneous drainage tube placement. Due to his comorbidities emergency department staff called the hospitalist service for admission.  Past Medical History:  Diagnosis Date  . Cardiac disease   . CHF (congestive heart failure) ([place])   . Hypercholesteremia   . Hypertension   . Kidney failure    left  . Osteoporosis   . Prostate cancer ([place])   . Shingles   . Skin cancer    right cheek    Past Surgical History:  Procedure Laterality Date  . CARDIAC CATHETERIZATION  04/13/2014    Family History  Problem Relation Age of Onset  . CAD Mother    Social History:  reports that he has quit smoking. His smoking use included Cigarettes and Cigars. His smokeless tobacco use includes Chew. He reports that he does not drink alcohol. His drug history is not on file.  Allergies:  Allergies  Allergen Reactions  . Ativan [Lorazepam] Other (See Comments)    Hallucinations, combative  . Penicillin G Other (See Comments)    Has patient had a PCN reaction causing immediate rash, facial/tongue/throat swelling, SOB or lightheadedness with hypotension: Yes Has patient had a PCN reaction causing severe rash involving mucus membranes or skin necrosis: No Has patient had a PCN reaction that required hospitalization No Has patient had a PCN reaction occurring within the last 10 years: No If all of the above  answers are "NO", then may proceed with Cephalosporin use.     Medications Prior to Admission  Medication Sig Dispense Refill  . aspirin EC 81 MG tablet Take 81 mg by mouth at bedtime.    Marland Kitchen atorvastatin (LIPITOR) 40 MG tablet Take 40 mg by mouth at bedtime.     . Cholecalciferol (VITAMIN D3) 5000 units TABS Take 5,000 Units by mouth daily.    Marland Kitchen doxazosin (CARDURA) 4 MG tablet Take 4 mg by mouth at bedtime.     . ferrous sulfate 325 (65 FE) MG tablet Take 325 mg by mouth 2 (two) times daily.     Marland Kitchen levothyroxine (SYNTHROID, LEVOTHROID) 100 MCG tablet Take 100 mcg by mouth daily.    . metoprolol succinate (TOPROL-XL) 50 MG 24 hr tablet Take 50 mg by mouth daily. Take with or immediately following a meal.    . nitroGLYCERIN (NITROSTAT) 0.4 MG SL tablet Place 0.4 mg under the tongue every 5 (five) minutes as needed for chest pain.    Marland Kitchen omega-3 acid ethyl esters (LOVAZA) 1 G capsule Take 1 capsule by mouth 2 (two) times daily.    . pantoprazole (PROTONIX) 40 MG tablet Take 40 mg by mouth 2 (two) times daily.     . potassium chloride SA (K-DUR,KLOR-CON) 20 MEQ tablet Take 20 mEq by mouth 2 (two) times daily.     . sodium bicarbonate 650 MG tablet Take 1,300 mg by mouth 2 (two) times daily.     . tamsulosin (FLOMAX) 0.4 MG CAPS capsule Take 0.4 mg by mouth daily.    Marland Kitchen  torsemide (DEMADEX) 20 MG tablet Take 20 mg by mouth 2 (two) times daily.       Results for orders placed or performed during the hospital encounter of 07/19/16 (from the past 48 hour(s))  Lipase, blood     Status: None   Collection Time: 07/19/16  4:02 PM  Result Value Ref Range   Lipase 19 11 - 51 U/L  Comprehensive metabolic panel     Status: Abnormal   Collection Time: 07/19/16  4:02 PM  Result Value Ref Range   Sodium 130 (L) 135 - 145 mmol/L   Potassium 4.5 3.5 - 5.1 mmol/L   Chloride 96 (L) 101 - 111 mmol/L   CO2 22 22 - 32 mmol/L   Glucose, Bld 121 (H) 65 - 99 mg/dL   BUN 56 (H) 6 - 20 mg/dL   Creatinine, Ser 3.89  (H) 0.61 - 1.24 mg/dL   Calcium 8.4 (L) 8.9 - 10.3 mg/dL   Total Protein 6.7 6.5 - 8.1 g/dL   Albumin 3.1 (L) 3.5 - 5.0 g/dL   AST 24 15 - 41 U/L   ALT 16 (L) 17 - 63 U/L   Alkaline Phosphatase 60 38 - 126 U/L   Total Bilirubin 1.1 0.3 - 1.2 mg/dL   GFR calc non Af Amer 13 (L) >60 mL/min   GFR calc Af Amer 15 (L) >60 mL/min    Comment: (NOTE) The eGFR has been calculated using the CKD EPI equation. This calculation has not been validated in all clinical situations. eGFR's persistently <60 mL/min signify possible Chronic Kidney Disease.    Anion gap 12 5 - 15  CBC     Status: Abnormal   Collection Time: 07/19/16  4:02 PM  Result Value Ref Range   WBC 12.1 (H) 3.8 - 10.6 K/uL   RBC 3.99 (L) 4.40 - 5.90 MIL/uL   Hemoglobin 11.7 (L) 13.0 - 18.0 g/dL   HCT 34.9 (L) 40.0 - 52.0 %   MCV 87.5 80.0 - 100.0 fL   MCH 29.4 26.0 - 34.0 pg   MCHC 33.6 32.0 - 36.0 g/dL   RDW 15.0 (H) 11.5 - 14.5 %   Platelets 151 150 - 440 K/uL  Urinalysis, Complete w Microscopic     Status: Abnormal   Collection Time: 07/19/16  4:02 PM  Result Value Ref Range   Color, Urine YELLOW (A) YELLOW   APPearance CLEAR (A) CLEAR   Specific Gravity, Urine 1.010 1.005 - 1.030   pH 5.0 5.0 - 8.0   Glucose, UA 50 (A) NEGATIVE mg/dL   Hgb urine dipstick SMALL (A) NEGATIVE   Bilirubin Urine NEGATIVE NEGATIVE   Ketones, ur NEGATIVE NEGATIVE mg/dL   Protein, ur 30 (A) NEGATIVE mg/dL   Nitrite NEGATIVE NEGATIVE   Leukocytes, UA NEGATIVE NEGATIVE   RBC / HPF 0-5 0 - 5 RBC/hpf   WBC, UA 0-5 0 - 5 WBC/hpf   Bacteria, UA RARE (A) NONE SEEN   Squamous Epithelial / LPF 0-5 (A) NONE SEEN   Mucous PRESENT    Hyaline Casts, UA PRESENT   Troponin I     Status: Abnormal   Collection Time: 07/19/16  4:02 PM  Result Value Ref Range   Troponin I 0.04 (HH) <0.03 ng/mL    Comment: CRITICAL RESULT CALLED TO, READ BACK BY AND VERIFIED WITH SONJA WEAVER 07/19/16 1645 KLW   Culture, blood (routine x 2)     Status: None  (Preliminary result)   Collection Time: 07/19/16  7:55  PM  Result Value Ref Range   Specimen Description BLOOD LEFT AC    Special Requests      BOTTLES DRAWN AEROBIC AND ANAEROBIC AER 37ML ANA 37M,L   Culture NO GROWTH < 12 HOURS    Report Status PENDING   Culture, blood (routine x 2)     Status: None (Preliminary result)   Collection Time: 07/19/16  7:55 PM  Result Value Ref Range   Specimen Description BLOOD RIGHT ARM    Special Requests      BOTTLES DRAWN AEROBIC AND ANAEROBIC AER 8ML ANA 37ML   Culture NO GROWTH < 12 HOURS    Report Status PENDING   Lactic acid, plasma     Status: None   Collection Time: 07/19/16  7:55 PM  Result Value Ref Range   Lactic Acid, Venous 1.6 0.5 - 1.9 mmol/L  Influenza panel by PCR (type A & B, H1N1)     Status: None   Collection Time: 07/19/16  7:56 PM  Result Value Ref Range   Influenza A By PCR NEGATIVE NEGATIVE   Influenza B By PCR NEGATIVE NEGATIVE    Comment: (NOTE) The Xpert Xpress Flu assay is intended as an aid in the diagnosis of  influenza and should not be used as a sole basis for treatment.  This  assay is FDA approved for nasopharyngeal swab specimens only. Nasal  washings and aspirates are unacceptable for Xpert Xpress Flu testing.    Ct Abdomen Pelvis Wo Contrast  Result Date: 07/20/2016 CLINICAL DATA:  Fever and low blood pressure. Mild cough. Abdominal pain. EXAM: CT ABDOMEN AND PELVIS WITHOUT CONTRAST TECHNIQUE: Multidetector CT imaging of the abdomen and pelvis was performed following the standard protocol without IV contrast. COMPARISON:  06/25/2016 FINDINGS: Lower chest: Atelectasis in the lung bases. Coronary artery calcifications. Large esophageal hiatal hernia containing much of the stomach. Hepatobiliary: Multiple stones in the gallbladder. Small amount of gas in the gallbladder. Gallbladder wall is thickened. Changes consistent with acute cholecystitis. Gas within the gallbladder may arise from fistula or gangrenous  cholecystitis. There is infiltration in the fat inferior to the gallbladder with a loculated fluid collection containing small gas bubbles. The collection measures about 5.9 x 7.7 cm. This likely represents an abscess, probably arising from perforated cholecystitis. Findings are progressing since previous study. There is associated inflammatory thickening of the hepatic flexure of the colon which is likely reactive. No bile duct dilatation. No focal liver lesions. Pancreas: Pancreas is atrophic. No peripancreatic collection or inflammatory change. No pancreatic ductal dilatation. Spleen: Normal in size without focal abnormality. Adrenals/Urinary Tract: Adrenal glands are prominent in size, greater on the left but no distinct nodule is identified. The left kidney is atrophic with hydronephrosis and hydroureter. Multiple stones are demonstrated in the distal left ureter, largest measuring 5 mm diameter. There is also large stone in the left renal pelvis measuring 1.5 cm diameter. These findings are unchanged since the previous study and suggest chronic left renal obstruction. There is a punctate size stone in the upper pole right kidney. No hydronephrosis or hydroureter. Bladder wall is not thickened and no bladder stones are identified. Stomach/Bowel: Stomach, small bowel, and colon are not abnormally distended. Diverticulosis of the sigmoid colon. No evidence of diverticulitis. Appendix is not identified. Vascular/Lymphatic: Aortic atherosclerosis. No enlarged abdominal or pelvic lymph nodes. Reproductive: Prostate gland is enlarged, measuring 4.4 cm diameter. Other: Prominent visceral abdominal adipose tissue. No free air or free fluid in the abdomen. Abdominal wall musculature is  thinned but intact. Musculoskeletal: No destructive bone lesions. IMPRESSION: 1. Cholelithiasis with changes of cholecystitis including gas within thickened gallbladder wall suggesting gangrenous cholecystitis. There is a developing fluid  collection with small gas bubbles inferior to the gallbladder, measuring 5.9 x 7.7 cm. This is consistent with a developing abscess, likely arising from the perforated cholecystitis. There is progression since the previous study. There is associated inflammatory wall thickening of the hepatic flexure of the colon. 2. Left renal atrophy with hydronephrosis and hydroureter. Stones demonstrated in the left renal pelvis and distal ureter. Changes suggest chronic obstruction. Nonobstructing punctate stone in the right kidney. 3. Large esophageal hiatal hernia containing most of the stomach. Electronically Signed   By: Lucienne Capers M.D.   On: 07/20/2016 00:55   Dg Chest Port 1 View  Result Date: 07/19/2016 CLINICAL DATA:  Low BP EXAM: PORTABLE CHEST 1 VIEW COMPARISON:  06/28/2016 FINDINGS: Mild cardiomegaly. No overt failure. Lobulation of the right diaphragm. No consolidation or large effusion. No pneumothorax. Large hiatal hernia. IMPRESSION: 1. Mild cardiomegaly without overt failure.  No acute infiltrate 2. Moderate large hiatal hernia Electronically Signed   By: Donavan Foil M.D.   On: 07/19/2016 19:54    Review of Systems  Constitutional: Negative for chills and fever.  HENT: Negative for sore throat and tinnitus.   Eyes: Negative for blurred vision and redness.  Respiratory: Negative for cough and shortness of breath.   Cardiovascular: Negative for chest pain, palpitations, orthopnea and PND.  Gastrointestinal: Positive for abdominal pain and nausea. Negative for diarrhea and vomiting.  Genitourinary: Negative for dysuria, frequency and urgency.  Musculoskeletal: Negative for joint pain and myalgias.  Skin: Negative for rash.       No lesions  Neurological: Negative for speech change, focal weakness and weakness.  Endo/Heme/Allergies: Does not bruise/bleed easily.       No temperature intolerance  Psychiatric/Behavioral: Negative for depression and suicidal ideas.    Blood pressure (!)  128/52, pulse 75, temperature 98 F (36.7 C), temperature source Oral, resp. rate 20, height _0  (1.702 m), weight 66.9 kg (147 lb 6.4 oz), SpO2 97 %. Physical Exam  Constitutional: He is oriented to person, place, and time. He appears well-developed and well-nourished. No distress.  HENT:  Head: Normocephalic and atraumatic.  Mouth/Throat: Oropharynx is clear and moist.  Eyes: Conjunctivae and EOM are normal. Pupils are equal, round, and reactive to light. No scleral icterus.  Neck: Normal range of motion. Neck supple. No JVD present. No tracheal deviation present. No thyromegaly present.  Cardiovascular: Normal rate and regular rhythm.  Exam reveals no gallop and no friction rub.   No murmur heard. Respiratory: Effort normal and breath sounds normal. No respiratory distress.  GI: Soft. Bowel sounds are normal. He exhibits no distension. There is tenderness. There is positive Murphy's sign.  Genitourinary:  Genitourinary Comments: Deferred  Musculoskeletal: Normal range of motion. He exhibits no edema.  Lymphadenopathy:    He has no cervical adenopathy.  Neurological: He is alert and oriented to person, place, and time. No cranial nerve deficit.  Skin: Skin is warm and dry. No rash noted. No erythema.  Psychiatric: He has a normal mood and affect. His behavior is normal. Judgment and thought content normal.     Assessment/Plan This is an 83 year old male admitted for cholecystitis read 1. Cholecystitis: No stones; HIDA scan normal, however the patient clearly has gallbladder dysfunction. Leukocytosis also present indicating inflammation. I started him on antibiotics and made nothing by mouth  in anticipation of surgery. He is moderate to high risk given his advanced age. 2. Sepsis: The patient intermittently meets criteria via tachycardia, tachypnea and leukocytosis. No fever recorded at the hospital. He is hemodynamically stable. Follow blood cultures for growth and sensitivities. 3.  Chronic kidney disease: Acute on chronic; stage V. Avoid nephrotoxic agents. 4. Hyponatremia: Likely combination of heart failure as well as decreased by mouth intake due to avoidance secondary to pain; I have started the patient on normal saline 5. CHF: Continue metoprolol as well as torsemide. The patient appears euvolemic at this time 6. Hypothyroidism: Continue Synthroid. 7. Hyperlipidemia: Continue statin therapy  8. BPH: Continue doxazosin as well as tamsulosin 9. DVT prophylaxis: SCDs 10. GI prophylaxis: Pantoprazole per home regimen The patient is a full code. Time spent on admission orders and patient care approximately 45 minutes  Harrie Foreman, MD 07/20/2016, 8:08 AM

## 2016-07-20 NOTE — Progress Notes (Signed)
Initial Nutrition Assessment  DOCUMENTATION CODES:   Severe malnutrition in context of chronic illness  INTERVENTION:  Provide Boost Breeze po TID, each supplement provides 250 kcal and 9 grams of protein.  Encouraged adequate intake of calories and protein with meals and snacks. Discussed protein options patient may enjoy. Encouraged patient to drink ONS at home to help meet his protein needs.   NUTRITION DIAGNOSIS:   Malnutrition (Severe) related to chronic illness as evidenced by severe depletion of body fat, severe depletion of muscle mass.  GOAL:   Patient will meet greater than or equal to 90% of their needs  MONITOR:   PO intake, Supplement acceptance, Diet advancement, Labs, Weight trends, I & O's  REASON FOR ASSESSMENT:   Malnutrition Screening Tool    ASSESSMENT:   83 year old male with PMHx of CKD, CHF, hypothyroidism, HLD, BPH who presents with abdominal pain found to have cholecystitis.    -Patient s/p cholecystostomy tube and percutaneous drainage of abscess today.   Spoke with patient and wife at bedside. Patient reports he has had a poor appetite for 2 days only in setting of abdominal pain. Reports appetite was good PTA and he was eating his usual intake of 2 meals per day with a snack. Breakfast is usually eggs, cheese, 2-3 slices bacon, 1/2 cup oatmeal, and coffee. Dinner is vegetables and 2 biscuits. Patient reports he does not like eating meat.   Patient reports UBW 188-190 lbs and does not know of any weight loss. RD discussed weight with patient and how much weight he has lost. Patient has lost 35 lbs (19% body weight) over 1 year, which is technically not significant for time frame.   Medications reviewed and include: Vitamin D 5000 units daily, Colace, ferrous sulfate 325 mg BID, levothyroxine, pantoprazole, potassium chloride 20 mEq BID, sodium bicarbonate 1300 mg BID, torsemide 20 mg BID, NS @ 75 ml/hr.  Labs reviewed: Sodium 130, Chloride 96,  Glucose 121, BUN 56, Creatinine 3.89, elevated Troponin.   Nutrition-Focused physical exam completed. Findings are moderate-severe fat depletion, severe muscle depletion, and no edema.   Diet Order:  Diet NPO time specified Diet clear liquid Room service appropriate? Yes; Fluid consistency: Thin  Skin:  Reviewed, no issues  Last BM:  Unknown  Height:   Ht Readings from Last 1 Encounters:  07/20/16 5\' 7"  (1.702 m)    Weight:   Wt Readings from Last 1 Encounters:  07/20/16 147 lb 6.4 oz (66.9 kg)    Ideal Body Weight:  67.3 kg  BMI:  Body mass index is 23.09 kg/m.  Estimated Nutritional Needs:   Kcal:  1575-1835 (MSJ x 1.2-1.4)  Protein:  65-80 grams (1-1.2 grams/kg)  Fluid:  1.6 L/day (25 ml/kg)  EDUCATION NEEDS:   Education needs addressed  Willey Blade, MS, RD, LDN Pager: [phone] After Hours Pager: [phone]

## 2016-07-20 NOTE — Progress Notes (Signed)
Pharmacy Antibiotic Note  [name] is a 83 y.o. male admitted on 07/19/2016 with intra abdominal infection.  Pharmacy has been consulted for Meropenem dosing. acute gangrenous cholecystitis   Plan: Transition from Aztreonam to Meropenem 500mg  IV Q12h (Crcl 12.5 ml/min). (patient with admission 12/18-12/23/2017 and received Meropenem without issue)    Height: 5\' 7"  (170.2 cm) Weight: 147 lb 6.4 oz (66.9 kg) IBW/kg (Calculated) : 66.1  Temp (24hrs), Avg:98.3 F (36.8 C), Min:97.7 F (36.5 C), Max:99.2 F (37.3 C)   Recent Labs Lab 07/19/16 1602 07/19/16 1955  WBC 12.1*  --   CREATININE 3.89*  --   LATICACIDVEN  --  1.6    Estimated Creatinine Clearance: 12.5 mL/min (by C-G formula based on SCr of 3.89 mg/dL (H)).    Allergies  Allergen Reactions  . Ativan [Lorazepam] Other (See Comments)    Hallucinations, combative  . Penicillin G Other (See Comments)    Has patient had a PCN reaction causing immediate rash, facial/tongue/throat swelling, SOB or lightheadedness with hypotension: Yes Has patient had a PCN reaction causing severe rash involving mucus membranes or skin necrosis: No Has patient had a PCN reaction that required hospitalization No Has patient had a PCN reaction occurring within the last 10 years: No If all of the above answers are "NO", then may proceed with Cephalosporin use.     Antimicrobials this admission: Aztreonam 1/12 >> 1/12 Meropenem 1/12 >>    Dose adjustments this admission:    Microbiology results: 1/11 BCx: pend 1/11 UCx: pend    Sputum:      MRSA PCR:    Thank you for allowing pharmacy to be a part of this patient's care.  Caylie Sandquist A 07/20/2016 11:54 AM

## 2016-07-20 NOTE — Consult Note (Signed)
Surgical Consultation  07/20/2016  [name] is an 83 y.o. male.   CC: Right upper quadrant pain  HPI: This a patient who I had seen in the emergency room at his last admission 3 weeks ago. He had a diagnosis of acute cholecystitis and was admitted to the hospital for control of his heart rate is treatment of sepsis. A HIDA scan was performed which showed an open cystic duct therefore cholecystostomy tube was not performed.  Today the patient returns with the same symptoms with right upper quadrant pain nausea but no emesis and no fevers or chills but signs of sepsis again in the emergency room. A workup suggested acute cholecystitis with perforated gallbladder and abscess.  He is on multiple medications for multiple medical problems including CHF and acute renal failure with chronic renal failure as well. I have spoken to both internal medicine as well as the emergency room physician concerning admission to the hospital and likely cholecystostomy tube placement and drainage  Past Medical History:  Diagnosis Date  . Cardiac disease   . CHF (congestive heart failure) ([place])   . Hypercholesteremia   . Hypertension   . Kidney failure    left  . Osteoporosis   . Prostate cancer ([place])   . Shingles   . Skin cancer    right cheek    Past Surgical History:  Procedure Laterality Date  . CARDIAC CATHETERIZATION  04/13/2014    Family History  Problem Relation Age of Onset  . CAD Mother     Social History:  reports that he has quit smoking. His smoking use included Cigarettes and Cigars. His smokeless tobacco use includes Chew. He reports that he does not drink alcohol. His drug history is not on file.  Allergies:  Allergies  Allergen Reactions  . Ativan [Lorazepam] Other (See Comments)    Hallucinations, combative  . Penicillin G Other (See Comments)    Has patient had a PCN reaction causing immediate rash, facial/tongue/throat swelling, SOB or lightheadedness with hypotension:  Yes Has patient had a PCN reaction causing severe rash involving mucus membranes or skin necrosis: No Has patient had a PCN reaction that required hospitalization No Has patient had a PCN reaction occurring within the last 10 years: No If all of the above answers are "NO", then may proceed with Cephalosporin use.     Medications reviewed.   Review of Systems:   Review of Systems  Constitutional: Positive for malaise/fatigue. Negative for chills and fever.  HENT: Negative.   Eyes: Negative.   Respiratory: Negative.   Cardiovascular: Negative.   Gastrointestinal: Positive for abdominal pain and nausea. Negative for blood in stool, constipation, diarrhea, heartburn, melena and vomiting.  Genitourinary: Negative.   Musculoskeletal: Negative.   Skin: Negative.   Neurological: Positive for weakness.  Endo/Heme/Allergies: Negative.   Psychiatric/Behavioral: Negative.      Physical Exam:  BP 108/79   Pulse 86   Temp 97.7 F (36.5 C) (Oral)   Resp (!) 24   Ht '5\' 8"'  (1.727 m)   Wt 167 lb (75.8 kg)   SpO2 92%   BMI 25.39 kg/m   Physical Exam  Constitutional: He is oriented to person, place, and time. No distress.  Awake and alert but frail and weak-appearing  HENT:  Head: Normocephalic and atraumatic.  Large long-standing left posterior neck mass  Eyes: Pupils are equal, round, and reactive to light. Right eye exhibits no discharge. Left eye exhibits no discharge. No scleral icterus.  Neck: Normal  range of motion.  Cardiovascular: Regular rhythm and normal heart sounds.   Heart rate ranging and recorded from 38-140 but sinus rhythm at this time  Pulmonary/Chest: Effort normal and breath sounds normal. No respiratory distress. He has no wheezes. He has no rales.  Abdominal: Soft. He exhibits distension. He exhibits no mass. There is tenderness. There is guarding. There is no rebound.  Tenderness in the right upper quadrant with a positive Murphy sign and suggestion of mass   Musculoskeletal: Normal range of motion. He exhibits edema. He exhibits no tenderness.  Lymphadenopathy:    He has no cervical adenopathy.  Neurological: He is alert and oriented to person, place, and time.  Skin: Skin is warm and dry. No rash noted. He is not diaphoretic. No erythema.  Psychiatric: Mood and affect normal.  Vitals reviewed.     Results for orders placed or performed during the hospital encounter of 07/19/16 (from the past 48 hour(s))  Lipase, blood     Status: None   Collection Time: 07/19/16  4:02 PM  Result Value Ref Range   Lipase 19 11 - 51 U/L  Comprehensive metabolic panel     Status: Abnormal   Collection Time: 07/19/16  4:02 PM  Result Value Ref Range   Sodium 130 (L) 135 - 145 mmol/L   Potassium 4.5 3.5 - 5.1 mmol/L   Chloride 96 (L) 101 - 111 mmol/L   CO2 22 22 - 32 mmol/L   Glucose, Bld 121 (H) 65 - 99 mg/dL   BUN 56 (H) 6 - 20 mg/dL   Creatinine, Ser 3.89 (H) 0.61 - 1.24 mg/dL   Calcium 8.4 (L) 8.9 - 10.3 mg/dL   Total Protein 6.7 6.5 - 8.1 g/dL   Albumin 3.1 (L) 3.5 - 5.0 g/dL   AST 24 15 - 41 U/L   ALT 16 (L) 17 - 63 U/L   Alkaline Phosphatase 60 38 - 126 U/L   Total Bilirubin 1.1 0.3 - 1.2 mg/dL   GFR calc non Af Amer 13 (L) >60 mL/min   GFR calc Af Amer 15 (L) >60 mL/min    Comment: (NOTE) The eGFR has been calculated using the CKD EPI equation. This calculation has not been validated in all clinical situations. eGFR's persistently <60 mL/min signify possible Chronic Kidney Disease.    Anion gap 12 5 - 15  CBC     Status: Abnormal   Collection Time: 07/19/16  4:02 PM  Result Value Ref Range   WBC 12.1 (H) 3.8 - 10.6 K/uL   RBC 3.99 (L) 4.40 - 5.90 MIL/uL   Hemoglobin 11.7 (L) 13.0 - 18.0 g/dL   HCT 34.9 (L) 40.0 - 52.0 %   MCV 87.5 80.0 - 100.0 fL   MCH 29.4 26.0 - 34.0 pg   MCHC 33.6 32.0 - 36.0 g/dL   RDW 15.0 (H) 11.5 - 14.5 %   Platelets 151 150 - 440 K/uL  Urinalysis, Complete w Microscopic     Status: Abnormal    Collection Time: 07/19/16  4:02 PM  Result Value Ref Range   Color, Urine YELLOW (A) YELLOW   APPearance CLEAR (A) CLEAR   Specific Gravity, Urine 1.010 1.005 - 1.030   pH 5.0 5.0 - 8.0   Glucose, UA 50 (A) NEGATIVE mg/dL   Hgb urine dipstick SMALL (A) NEGATIVE   Bilirubin Urine NEGATIVE NEGATIVE   Ketones, ur NEGATIVE NEGATIVE mg/dL   Protein, ur 30 (A) NEGATIVE mg/dL   Nitrite NEGATIVE NEGATIVE  Leukocytes, UA NEGATIVE NEGATIVE   RBC / HPF 0-5 0 - 5 RBC/hpf   WBC, UA 0-5 0 - 5 WBC/hpf   Bacteria, UA RARE (A) NONE SEEN   Squamous Epithelial / LPF 0-5 (A) NONE SEEN   Mucous PRESENT    Hyaline Casts, UA PRESENT   Troponin I     Status: Abnormal   Collection Time: 07/19/16  4:02 PM  Result Value Ref Range   Troponin I 0.04 (HH) <0.03 ng/mL    Comment: CRITICAL RESULT CALLED TO, READ BACK BY AND VERIFIED WITH SONJA WEAVER 07/19/16 1645 KLW   Lactic acid, plasma     Status: None   Collection Time: 07/19/16  7:55 PM  Result Value Ref Range   Lactic Acid, Venous 1.6 0.5 - 1.9 mmol/L  Influenza panel by PCR (type A & B, H1N1)     Status: None   Collection Time: 07/19/16  7:56 PM  Result Value Ref Range   Influenza A By PCR NEGATIVE NEGATIVE   Influenza B By PCR NEGATIVE NEGATIVE    Comment: (NOTE) The Xpert Xpress Flu assay is intended as an aid in the diagnosis of  influenza and should not be used as a sole basis for treatment.  This  assay is FDA approved for nasopharyngeal swab specimens only. Nasal  washings and aspirates are unacceptable for Xpert Xpress Flu testing.    Ct Abdomen Pelvis Wo Contrast  Result Date: 07/20/2016 CLINICAL DATA:  Fever and low blood pressure. Mild cough. Abdominal pain. EXAM: CT ABDOMEN AND PELVIS WITHOUT CONTRAST TECHNIQUE: Multidetector CT imaging of the abdomen and pelvis was performed following the standard protocol without IV contrast. COMPARISON:  06/25/2016 FINDINGS: Lower chest: Atelectasis in the lung bases. Coronary artery calcifications.  Large esophageal hiatal hernia containing much of the stomach. Hepatobiliary: Multiple stones in the gallbladder. Small amount of gas in the gallbladder. Gallbladder wall is thickened. Changes consistent with acute cholecystitis. Gas within the gallbladder may arise from fistula or gangrenous cholecystitis. There is infiltration in the fat inferior to the gallbladder with a loculated fluid collection containing small gas bubbles. The collection measures about 5.9 x 7.7 cm. This likely represents an abscess, probably arising from perforated cholecystitis. Findings are progressing since previous study. There is associated inflammatory thickening of the hepatic flexure of the colon which is likely reactive. No bile duct dilatation. No focal liver lesions. Pancreas: Pancreas is atrophic. No peripancreatic collection or inflammatory change. No pancreatic ductal dilatation. Spleen: Normal in size without focal abnormality. Adrenals/Urinary Tract: Adrenal glands are prominent in size, greater on the left but no distinct nodule is identified. The left kidney is atrophic with hydronephrosis and hydroureter. Multiple stones are demonstrated in the distal left ureter, largest measuring 5 mm diameter. There is also large stone in the left renal pelvis measuring 1.5 cm diameter. These findings are unchanged since the previous study and suggest chronic left renal obstruction. There is a punctate size stone in the upper pole right kidney. No hydronephrosis or hydroureter. Bladder wall is not thickened and no bladder stones are identified. Stomach/Bowel: Stomach, small bowel, and colon are not abnormally distended. Diverticulosis of the sigmoid colon. No evidence of diverticulitis. Appendix is not identified. Vascular/Lymphatic: Aortic atherosclerosis. No enlarged abdominal or pelvic lymph nodes. Reproductive: Prostate gland is enlarged, measuring 4.4 cm diameter. Other: Prominent visceral abdominal adipose tissue. No free air or  free fluid in the abdomen. Abdominal wall musculature is thinned but intact. Musculoskeletal: No destructive bone lesions. IMPRESSION: 1. Cholelithiasis  with changes of cholecystitis including gas within thickened gallbladder wall suggesting gangrenous cholecystitis. There is a developing fluid collection with small gas bubbles inferior to the gallbladder, measuring 5.9 x 7.7 cm. This is consistent with a developing abscess, likely arising from the perforated cholecystitis. There is progression since the previous study. There is associated inflammatory wall thickening of the hepatic flexure of the colon. 2. Left renal atrophy with hydronephrosis and hydroureter. Stones demonstrated in the left renal pelvis and distal ureter. Changes suggest chronic obstruction. Nonobstructing punctate stone in the right kidney. 3. Large esophageal hiatal hernia containing most of the stomach. Electronically Signed   By: Lucienne Capers M.D.   On: 07/20/2016 00:55   Dg Chest Port 1 View  Result Date: 07/19/2016 CLINICAL DATA:  Low BP EXAM: PORTABLE CHEST 1 VIEW COMPARISON:  06/28/2016 FINDINGS: Mild cardiomegaly. No overt failure. Lobulation of the right diaphragm. No consolidation or large effusion. No pneumothorax. Large hiatal hernia. IMPRESSION: 1. Mild cardiomegaly without overt failure.  No acute infiltrate 2. Moderate large hiatal hernia Electronically Signed   By: Donavan Foil M.D.   On: 07/19/2016 19:54    Assessment/Plan:  This patient with acute cholecystitis. I had seen him in the emergency room when he was admitted last admission 3 weeks ago. I had ordered a HIDA scan which was normal but had considered cholecystostomy tube. He apparently a improved while on IV antibiotics and HIDA scan eating normal the cholecystostomy tube was never placed. The patient returns with the same symptoms and signs of acute cholecystitis with possible perforation and abscess. He will likely require CT-guided drainage of the  abscess as well as cholecystostomy tube at this point.  The patient has considerable medical problems and is in renal failure at this time with a creatinine of 3.89 and multiple electrolyte abnormalities. I discussed his care with Dr. Marcille Blanco and Dr. Owens Shark in the emergency room Dr. Marcille Blanco had admitted the patient previously and remember him as well. I will talk to Dr. Dahlia Byes in the morning about ordering a cholecystostomy tube as well as CT-guided drainage as I do not believe he is a good surgical candidate at this time. Ultimately he will require cholecystectomy but would benefit from drainage at this point. This reviewed with the patient as wife and providing physicians.  Florene Glen, MD, FACS

## 2016-07-20 NOTE — Progress Notes (Signed)
ANTIBIOTIC CONSULT NOTE - INITIAL  Pharmacy Consult for Aztreonam  Indication: cholecystitis  Allergies  Allergen Reactions  . Ativan [Lorazepam] Other (See Comments)    Hallucinations, combative  . Penicillin G Other (See Comments)    Has patient had a PCN reaction causing immediate rash, facial/tongue/throat swelling, SOB or lightheadedness with hypotension: Yes Has patient had a PCN reaction causing severe rash involving mucus membranes or skin necrosis: No Has patient had a PCN reaction that required hospitalization No Has patient had a PCN reaction occurring within the last 10 years: No If all of the above answers are "NO", then may proceed with Cephalosporin use.     Patient Measurements: Height: 5\' 7"  (170.2 cm) Weight: 147 lb 6.4 oz (66.9 kg) IBW/kg (Calculated) : 66.1 Adjusted Body Weight:   Vital Signs: Temp: 99.2 F (37.3 C) (01/12 0609) Temp Source: Oral (01/12 0609) BP: 117/55 (01/12 0609) Pulse Rate: 79 (01/12 0609) Intake/Output from previous day: 01/11 0701 - 01/12 0700 In: 1250 [IV Piggyback:1250] Out: -  Intake/Output from this shift: Total I/O In: 1250 [IV Piggyback:1250] Out: -   Labs:  Recent Labs  07/19/16 1602  WBC 12.1*  HGB 11.7*  PLT 151  CREATININE 3.89*   Estimated Creatinine Clearance: 12.5 mL/min (by C-G formula based on SCr of 3.89 mg/dL (H)). No results for input(s): VANCOTROUGH, VANCOPEAK, VANCORANDOM, GENTTROUGH, GENTPEAK, GENTRANDOM, TOBRATROUGH, TOBRAPEAK, TOBRARND, AMIKACINPEAK, AMIKACINTROU, AMIKACIN in the last 72 hours.   Microbiology: Recent Results (from the past 720 hour(s))  Blood culture (routine x 2)     Status: None   Collection Time: 06/25/16  2:08 AM  Result Value Ref Range Status   Specimen Description BLOOD LEFT ANTECUBITAL  Final   Special Requests   Final    BOTTLES DRAWN AEROBIC AND ANAEROBIC [place]   Culture NO GROWTH 5 DAYS  Final   Report Status 06/30/2016 FINAL  Final  Blood culture  (routine x 2)     Status: None   Collection Time: 06/25/16  2:09 AM  Result Value Ref Range Status   Specimen Description BLOOD LEFT WRIST  Final   Special Requests BOTTLES DRAWN AEROBIC AND ANAEROBIC [id number],3CCANA  Final   Culture NO GROWTH 5 DAYS  Final   Report Status 06/30/2016 FINAL  Final    Medical History: Past Medical History:  Diagnosis Date  . Cardiac disease   . CHF (congestive heart failure) ([place])   . Hypercholesteremia   . Hypertension   . Kidney failure    left  . Osteoporosis   . Prostate cancer ([place])   . Shingles   . Skin cancer    right cheek    Medications:  Prescriptions Prior to Admission  Medication Sig Dispense Refill Last Dose  . aspirin EC 81 MG tablet Take 81 mg by mouth at bedtime.   Unknown at Unknown  . atorvastatin (LIPITOR) 40 MG tablet Take 40 mg by mouth at bedtime.    Unknown at Unknown  . Cholecalciferol (VITAMIN D3) 5000 units TABS Take 5,000 Units by mouth daily.   Unknown at Unknown  . doxazosin (CARDURA) 4 MG tablet Take 4 mg by mouth at bedtime.    Unknown at Unknown  . ferrous sulfate 325 (65 FE) MG tablet Take 325 mg by mouth 2 (two) times daily.    Unknown at Unknown  . levothyroxine (SYNTHROID, LEVOTHROID) 100 MCG tablet Take 100 mcg by mouth daily.   Unknown at Unknown  . metoprolol succinate (TOPROL-XL) 50 MG 24 hr tablet  Take 50 mg by mouth daily. Take with or immediately following a meal.   Unknown at Unknown  . nitroGLYCERIN (NITROSTAT) 0.4 MG SL tablet Place 0.4 mg under the tongue every 5 (five) minutes as needed for chest pain.   prn at prn  . omega-3 acid ethyl esters (LOVAZA) 1 G capsule Take 1 capsule by mouth 2 (two) times daily.   Unknown at Unknown  . pantoprazole (PROTONIX) 40 MG tablet Take 40 mg by mouth 2 (two) times daily.    Unknown at Unknown  . potassium chloride SA (K-DUR,KLOR-CON) 20 MEQ tablet Take 20 mEq by mouth 2 (two) times daily.    Unknown at Unknown  . sodium bicarbonate 650 MG tablet Take 1,300 mg by  mouth 2 (two) times daily.    Unknown at Unknown  . tamsulosin (FLOMAX) 0.4 MG CAPS capsule Take 0.4 mg by mouth daily.   Unknown at Unknown  . torsemide (DEMADEX) 20 MG tablet Take 20 mg by mouth 2 (two) times daily.    Unknown at Unknown   Assessment: CrCl = 12.5 ml/min  Goal of Therapy:  resolution of infection  Plan:  Expected duration 7 days with resolution of temperature and/or normalization of WBC   Aztreonam 1 gm IV Q8H ordered to start on 1/12 .    Pharmacy will follow.   Schae Cando D 07/20/2016,6:46 AM

## 2016-07-20 NOTE — Progress Notes (Signed)
Seen and examined Debilitated elderly pt w gangrenous cholecystitis and Abscess S/p Cholecystostomy tube and Perc drainage of abscess Doing well Abd: soft, no peritonitis  A/P continue A/Bs and drain Clears No surgical intervention

## 2016-07-20 NOTE — ED Notes (Signed)
Dr. Cooper at bedside 

## 2016-07-20 NOTE — Care Management (Signed)
Patient admitted with cholecystitis.  Patient lives at home with wife.  Adult children live locally for support.  Patient states that he has RW, cane, and BSC in the home if needed.  PCP [facility].  Pharmacy [facility]. Patient open with [facility] for RN and PT.  RNCM following for discharge planning.

## 2016-07-20 NOTE — ED Notes (Signed)
Patient transported to CT 

## 2016-07-20 NOTE — Consult Note (Signed)
Chief Complaint: Patient was seen in consultation today for  Chief Complaint  Patient presents with  . Hypotension  . Abdominal Pain   at the request of Dr. Rosilyn Mings  Referring Physician(s): Dr. Rosilyn Mings  Patient Status: [facility] - In-pt  History of Present Illness: [name] is a 83 y.o. male with multiple medical problems and presented to the emergency department with right abdominal pain. Patient had sepsis secondary to cholecystitis in December 2017 which improved with medical treatment. In December 2017, HIDA scan demonstrated a patent cystic duct. Patient has new imaging which demonstrates a fluid collection adjacent to the gallbladder which is concerning for an abscess and a perforated gallbladder. There is also gas within the gallbladder raising concern for gangrenous cholecystitis. Patient was evaluated by Surgery. Surgery did not feel the patient was a good surgical candidate at this time and recommended drainage of the abscess and gallbladder. Patient's main complaint is right abdominal pain. He does not complaining of shortness of breath or chest symptoms.  Past Medical History:  Diagnosis Date  . Cardiac disease   . CHF (congestive heart failure) ([place])   . Hypercholesteremia   . Hypertension   . Kidney failure    left  . Osteoporosis   . Prostate cancer ([place])   . Shingles   . Skin cancer    right cheek    Past Surgical History:  Procedure Laterality Date  . CARDIAC CATHETERIZATION  04/13/2014    Allergies: Ativan [lorazepam] and Penicillin g  Medications: Prior to Admission medications   Medication Sig Start Date End Date Taking? Authorizing Provider  aspirin EC 81 MG tablet Take 81 mg by mouth at bedtime.   Yes Historical Provider, MD  atorvastatin (LIPITOR) 40 MG tablet Take 40 mg by mouth at bedtime.    Yes Historical Provider, MD  Cholecalciferol (VITAMIN D3) 5000 units TABS Take 5,000 Units by mouth daily.   Yes Historical Provider, MD    doxazosin (CARDURA) 4 MG tablet Take 4 mg by mouth at bedtime.    Yes Historical Provider, MD  ferrous sulfate 325 (65 FE) MG tablet Take 325 mg by mouth 2 (two) times daily.    Yes Historical Provider, MD  levothyroxine (SYNTHROID, LEVOTHROID) 100 MCG tablet Take 100 mcg by mouth daily.   Yes Historical Provider, MD  metoprolol succinate (TOPROL-XL) 50 MG 24 hr tablet Take 50 mg by mouth daily. Take with or immediately following a meal.   Yes Historical Provider, MD  nitroGLYCERIN (NITROSTAT) 0.4 MG SL tablet Place 0.4 mg under the tongue every 5 (five) minutes as needed for chest pain.   Yes Historical Provider, MD  omega-3 acid ethyl esters (LOVAZA) 1 G capsule Take 1 capsule by mouth 2 (two) times daily.   Yes Historical Provider, MD  pantoprazole (PROTONIX) 40 MG tablet Take 40 mg by mouth 2 (two) times daily.    Yes Historical Provider, MD  potassium chloride SA (K-DUR,KLOR-CON) 20 MEQ tablet Take 20 mEq by mouth 2 (two) times daily.  08/18/15  Yes Historical Provider, MD  sodium bicarbonate 650 MG tablet Take 1,300 mg by mouth 2 (two) times daily.    Yes Historical Provider, MD  tamsulosin (FLOMAX) 0.4 MG CAPS capsule Take 0.4 mg by mouth daily.   Yes Historical Provider, MD  torsemide (DEMADEX) 20 MG tablet Take 20 mg by mouth 2 (two) times daily.  10/27/15  Yes Historical Provider, MD     Family History  Problem Relation Age  of Onset  . CAD Mother     Social History   Social History  . Marital status: Married    Spouse name: N/A  . Number of children: N/A  . Years of education: N/A   Social History Main Topics  . Smoking status: Former Smoker    Types: Cigarettes, Cigars  . Smokeless tobacco: Current User    Types: Chew  . Alcohol use No  . Drug use: Unknown  . Sexual activity: Not Asked   Other Topics Concern  . None   Social History Narrative  . None     Review of Systems  Constitutional: Positive for fever. Negative for chills.  Respiratory: Negative for  shortness of breath.   Cardiovascular: Negative for chest pain.  Gastrointestinal: Positive for abdominal pain.  Genitourinary: Positive for difficulty urinating.    Vital Signs: BP (!) 128/52 (BP Location: Right Arm)   Pulse 75   Temp 98 F (36.7 C) (Oral)   Resp 20   Ht 5\' 7"  (1.702 m)   Wt 147 lb 6.4 oz (66.9 kg)   SpO2 97%   BMI 23.09 kg/m   Physical Exam  Constitutional:  Patient appears weak and frail.  Cardiovascular: Normal rate, regular rhythm and normal heart sounds.   Pulmonary/Chest:  Normal right breast sounds. Decreased breath sounds on the left.  Abdominal: Bowel sounds are normal. There is tenderness in the right upper quadrant. There is guarding.      Mallampati Score:  MD Evaluation Airway: WNL Heart: WNL Abdomen comments: Right abdominal pain with light palpation Chest/ Lungs: Other (comments) Chest/ lungs comments: Decreased breath sounds on left.   ASA  Classification: 2 Mallampati/Airway Score: Two  Imaging: Ct Abdomen Pelvis Wo Contrast  Result Date: 07/20/2016 CLINICAL DATA:  Fever and low blood pressure. Mild cough. Abdominal pain. EXAM: CT ABDOMEN AND PELVIS WITHOUT CONTRAST TECHNIQUE: Multidetector CT imaging of the abdomen and pelvis was performed following the standard protocol without IV contrast. COMPARISON:  06/25/2016 FINDINGS: Lower chest: Atelectasis in the lung bases. Coronary artery calcifications. Large esophageal hiatal hernia containing much of the stomach. Hepatobiliary: Multiple stones in the gallbladder. Small amount of gas in the gallbladder. Gallbladder wall is thickened. Changes consistent with acute cholecystitis. Gas within the gallbladder may arise from fistula or gangrenous cholecystitis. There is infiltration in the fat inferior to the gallbladder with a loculated fluid collection containing small gas bubbles. The collection measures about 5.9 x 7.7 cm. This likely represents an abscess, probably arising from perforated  cholecystitis. Findings are progressing since previous study. There is associated inflammatory thickening of the hepatic flexure of the colon which is likely reactive. No bile duct dilatation. No focal liver lesions. Pancreas: Pancreas is atrophic. No peripancreatic collection or inflammatory change. No pancreatic ductal dilatation. Spleen: Normal in size without focal abnormality. Adrenals/Urinary Tract: Adrenal glands are prominent in size, greater on the left but no distinct nodule is identified. The left kidney is atrophic with hydronephrosis and hydroureter. Multiple stones are demonstrated in the distal left ureter, largest measuring 5 mm diameter. There is also large stone in the left renal pelvis measuring 1.5 cm diameter. These findings are unchanged since the previous study and suggest chronic left renal obstruction. There is a punctate size stone in the upper pole right kidney. No hydronephrosis or hydroureter. Bladder wall is not thickened and no bladder stones are identified. Stomach/Bowel: Stomach, small bowel, and colon are not abnormally distended. Diverticulosis of the sigmoid colon. No evidence of diverticulitis. Appendix  is not identified. Vascular/Lymphatic: Aortic atherosclerosis. No enlarged abdominal or pelvic lymph nodes. Reproductive: Prostate gland is enlarged, measuring 4.4 cm diameter. Other: Prominent visceral abdominal adipose tissue. No free air or free fluid in the abdomen. Abdominal wall musculature is thinned but intact. Musculoskeletal: No destructive bone lesions. IMPRESSION: 1. Cholelithiasis with changes of cholecystitis including gas within thickened gallbladder wall suggesting gangrenous cholecystitis. There is a developing fluid collection with small gas bubbles inferior to the gallbladder, measuring 5.9 x 7.7 cm. This is consistent with a developing abscess, likely arising from the perforated cholecystitis. There is progression since the previous study. There is associated  inflammatory wall thickening of the hepatic flexure of the colon. 2. Left renal atrophy with hydronephrosis and hydroureter. Stones demonstrated in the left renal pelvis and distal ureter. Changes suggest chronic obstruction. Nonobstructing punctate stone in the right kidney. 3. Large esophageal hiatal hernia containing most of the stomach. Electronically Signed   By: Lucienne Capers M.D.   On: 07/20/2016 00:55   Ct Abdomen Pelvis Wo Contrast  Result Date: 06/25/2016 CLINICAL DATA:  83 year old male with sepsis and abdominal pain. History of prostate cancer. EXAM: CT CHEST, ABDOMEN AND PELVIS WITHOUT CONTRAST TECHNIQUE: Multidetector CT imaging of the chest, abdomen and pelvis was performed following the standard protocol without IV contrast. COMPARISON:  None. FINDINGS: Evaluation of this exam is limited in the absence of intravenous contrast. CT CHEST FINDINGS Cardiovascular: There is no cardiomegaly or pericardial effusion. Multi vessel coronary vascular calcification involving the LAD, left circumflex, and RCA. There is atherosclerotic calcification of the thoracic aorta. No aneurysmal dilatation. The central pulmonary arteries are grossly unremarkable on this noncontrast study. Mediastinum/Nodes: No hilar or mediastinal adenopathy. There is a moderate size hiatal hernia. The esophagus is grossly unremarkable. Subcentimeter right thyroid nodule. No mediastinal collection or hematoma. Lungs/Pleura: Bibasilar dependent atelectatic changes. Left lung base compressive atelectasis secondary to hiatal hernia. Pneumonia is less likely. There is no pleural effusion or pneumothorax. The central airways are patent. Musculoskeletal: No axillary adenopathy. The chest wall soft tissues appear unremarkable. There is osteopenia with degenerative changes of the spine. No acute fracture. CT ABDOMEN PELVIS FINDINGS No intra-abdominal free air. Small subhepatic and pericholecystic fluid. Hepatobiliary: The liver is grossly  unremarkable. There multiple stones within the gallbladder fundus. A stone is noted at the gallbladder neck. There is diffuse gallbladder wall thickening and edema. A 5.6 x 6.3 cm fluid collection noted in the right upper abdomen inferior to the gallbladder. This collection does not demonstrate an organized wall or definite evidence of loculation. Pancreas: The pancreas is atrophic. No inflammatory changes of pancreas. Spleen: Normal in size without focal abnormality. Adrenals/Urinary Tract: The adrenal glands appear unremarkable. The left kidney is atrophic. There is a 15 mm stone in the left kidney. There are two adjacent stones in the distal left ureter measuring up to 6 mm. There is moderate left hydronephrosis. Punctate nonobstructing right renal upper pole calculus. There is no hydronephrosis on the right. The right ureter appears unremarkable. The urinary bladder is predominantly collapsed. There is apparent diffuse thickening of the bladder wall which may be partly related to underdistention. Cystitis is not excluded. Correlation with urinalysis recommended. Stomach/Bowel: There is extensive sigmoid diverticulosis without active inflammatory changes. Moderate size hiatal hernia noted. There is no evidence of bowel obstruction for active inflammation. The appendix appears unremarkable. Vascular/Lymphatic: There is moderate aortoiliac atherosclerotic disease. No aneurysmal dilatation. No portal venous gas identified. There is no adenopathy. Reproductive: The prostate and seminal vesicles are  grossly unremarkable. Other: Small fat containing left inguinal hernia. No fluid collection. Musculoskeletal: Osteopenia.  No acute fracture. IMPRESSION: Cholelithiasis with findings most compatible with acute cholecystitis. A 4 mm stone at the neck of the gallbladder is concerning for an obstructing gallstone. Correlation with clinical exam recommended. Ultrasound may provide additional information. Small amount of fluid  inferior to the gallbladder does not demonstrate loculation or organized wall. Atrophic left kidney. Two adjacent distal left ureteral calculi measuring up to 6 mm with moderate left hydronephrosis. A 15 mm left renal calculus is also noted. Punctate nonobstructing right renal upper pole stone. No hydronephrosis. Thickened bladder wall. Correlation with urinalysis recommended to evaluate for cystitis. Extensive colonic diverticulosis. No evidence of bowel obstruction or active inflammation. She Electronically Signed   By: Anner Crete M.D.   On: 06/25/2016 02:39   Dg Chest 1 View  Result Date: 06/26/2016 CLINICAL DATA:  Shortness of breath. EXAM: CHEST 1 VIEW COMPARISON:  CT 06/25/2016 . FINDINGS: Mediastinum and hilar structures are normal. Elevation left hemidiaphragm noted. Hiatal hernia as noted on prior CT noted. Mild basilar atelectasis on the left. Small left pleural effusion cannot be excluded. No pneumothorax. IMPRESSION: Elevation of the left hemidiaphragm. Hiatal hernia is noted on prior CT of 06/25/2016 noted. Mild basilar atelectasis on the left noted.Small left pleural effusion cannot be excluded. Electronically Signed   By: Marcello Moores  Register   On: 06/26/2016 10:43   Dg Chest 2 View  Result Date: 06/28/2016 CLINICAL DATA:  Abdomen pain patient feels bloated and sick EXAM: CHEST  2 VIEW COMPARISON:  06/26/2016 FINDINGS: Persistent elevation of the left diaphragm. Small left effusion and left basilar atelectasis or infiltrate. Stable cardiomegaly. Large hiatal hernia. No pneumothorax. IMPRESSION: 1. No significant interval change in small left pleural effusion and left basilar atelectasis or infiltrate 2. Large hiatal hernia 3. Cardiomegaly Electronically Signed   By: Donavan Foil M.D.   On: 06/28/2016 14:08   Ct Chest Wo Contrast  Result Date: 06/25/2016 CLINICAL DATA:  83 year old male with sepsis and abdominal pain. History of prostate cancer. EXAM: CT CHEST, ABDOMEN AND PELVIS  WITHOUT CONTRAST TECHNIQUE: Multidetector CT imaging of the chest, abdomen and pelvis was performed following the standard protocol without IV contrast. COMPARISON:  None. FINDINGS: Evaluation of this exam is limited in the absence of intravenous contrast. CT CHEST FINDINGS Cardiovascular: There is no cardiomegaly or pericardial effusion. Multi vessel coronary vascular calcification involving the LAD, left circumflex, and RCA. There is atherosclerotic calcification of the thoracic aorta. No aneurysmal dilatation. The central pulmonary arteries are grossly unremarkable on this noncontrast study. Mediastinum/Nodes: No hilar or mediastinal adenopathy. There is a moderate size hiatal hernia. The esophagus is grossly unremarkable. Subcentimeter right thyroid nodule. No mediastinal collection or hematoma. Lungs/Pleura: Bibasilar dependent atelectatic changes. Left lung base compressive atelectasis secondary to hiatal hernia. Pneumonia is less likely. There is no pleural effusion or pneumothorax. The central airways are patent. Musculoskeletal: No axillary adenopathy. The chest wall soft tissues appear unremarkable. There is osteopenia with degenerative changes of the spine. No acute fracture. CT ABDOMEN PELVIS FINDINGS No intra-abdominal free air. Small subhepatic and pericholecystic fluid. Hepatobiliary: The liver is grossly unremarkable. There multiple stones within the gallbladder fundus. A stone is noted at the gallbladder neck. There is diffuse gallbladder wall thickening and edema. A 5.6 x 6.3 cm fluid collection noted in the right upper abdomen inferior to the gallbladder. This collection does not demonstrate an organized wall or definite evidence of loculation. Pancreas: The pancreas is  atrophic. No inflammatory changes of pancreas. Spleen: Normal in size without focal abnormality. Adrenals/Urinary Tract: The adrenal glands appear unremarkable. The left kidney is atrophic. There is a 15 mm stone in the left  kidney. There are two adjacent stones in the distal left ureter measuring up to 6 mm. There is moderate left hydronephrosis. Punctate nonobstructing right renal upper pole calculus. There is no hydronephrosis on the right. The right ureter appears unremarkable. The urinary bladder is predominantly collapsed. There is apparent diffuse thickening of the bladder wall which may be partly related to underdistention. Cystitis is not excluded. Correlation with urinalysis recommended. Stomach/Bowel: There is extensive sigmoid diverticulosis without active inflammatory changes. Moderate size hiatal hernia noted. There is no evidence of bowel obstruction for active inflammation. The appendix appears unremarkable. Vascular/Lymphatic: There is moderate aortoiliac atherosclerotic disease. No aneurysmal dilatation. No portal venous gas identified. There is no adenopathy. Reproductive: The prostate and seminal vesicles are grossly unremarkable. Other: Small fat containing left inguinal hernia. No fluid collection. Musculoskeletal: Osteopenia.  No acute fracture. IMPRESSION: Cholelithiasis with findings most compatible with acute cholecystitis. A 4 mm stone at the neck of the gallbladder is concerning for an obstructing gallstone. Correlation with clinical exam recommended. Ultrasound may provide additional information. Small amount of fluid inferior to the gallbladder does not demonstrate loculation or organized wall. Atrophic left kidney. Two adjacent distal left ureteral calculi measuring up to 6 mm with moderate left hydronephrosis. A 15 mm left renal calculus is also noted. Punctate nonobstructing right renal upper pole stone. No hydronephrosis. Thickened bladder wall. Correlation with urinalysis recommended to evaluate for cystitis. Extensive colonic diverticulosis. No evidence of bowel obstruction or active inflammation. She Electronically Signed   By: Anner Crete M.D.   On: 06/25/2016 02:39   Nm Hepatobiliary Liver  Func  Result Date: 06/25/2016 CLINICAL DATA:  Abdominal pain 3 days. Cholelithiasis and acute cholecystitis. EXAM: NUCLEAR MEDICINE HEPATOBILIARY IMAGING TECHNIQUE: Sequential images of the abdomen were obtained out to 60 minutes following intravenous administration of radiopharmaceutical. RADIOPHARMACEUTICALS:  4.9 mCi Tc-60m  Choletec IV COMPARISON:  CT on 06/25/2016 FINDINGS: Prompt uptake and biliary excretion of activity by the liver is seen. Gallbladder activity is visualized, consistent with patency of cystic duct. Biliary activity passes into small bowel, consistent with patent common bile duct. IMPRESSION: Patency of both cystic and common bile ducts demonstrated (despite findings of acute cholecystitis on recent CT). See previous CT report. Electronically Signed   By: Earle Gell M.D.   On: 06/25/2016 15:00   Dg Chest Port 1 View  Result Date: 07/19/2016 CLINICAL DATA:  Low BP EXAM: PORTABLE CHEST 1 VIEW COMPARISON:  06/28/2016 FINDINGS: Mild cardiomegaly. No overt failure. Lobulation of the right diaphragm. No consolidation or large effusion. No pneumothorax. Large hiatal hernia. IMPRESSION: 1. Mild cardiomegaly without overt failure.  No acute infiltrate 2. Moderate large hiatal hernia Electronically Signed   By: Donavan Foil M.D.   On: 07/19/2016 19:54   Dg Chest Portable 1 View  Result Date: 06/25/2016 CLINICAL DATA:  83 year old male with acute respiratory distress. EXAM: PORTABLE CHEST 1 VIEW COMPARISON:  Chest radiograph dated 12/01/2010 and abdominal CT dated 07/12/2015 FINDINGS: Mild diffuse interstitial coarsening. No focal consolidation, pleural effusion, or pneumothorax. Moderate size hiatal hernia. Stable cardiac silhouette. No acute osseous pathology. IMPRESSION: No acute cardiopulmonary process. Moderate hiatal hernia. Electronically Signed   By: Anner Crete M.D.   On: 06/25/2016 00:50   Dg Abd 2 Views  Result Date: 06/28/2016 CLINICAL DATA:  Diffuse abdominal  pain  EXAM: ABDOMEN - 2 VIEW COMPARISON:  06/25/2016 FINDINGS: Scattered large and small bowel gas is noted. Diffuse dilatation of multiple small bowel loops is seen. No free air is noted. A paucity of colonic gas is seen. These changes are consistent with a at least partial small bowel obstruction. No acute bony abnormality is seen. IMPRESSION: Changes consistent with at least a partial small bowel obstruction. No free air is noted. These results will be called to the ordering clinician or representative by the Radiologist Assistant, and communication documented in the PACS or zVision Dashboard. Electronically Signed   By: Inez Catalina M.D.   On: 06/28/2016 14:06    Labs:  CBC:  Recent Labs  06/26/16 0449 06/27/16 0512 06/28/16 0533 07/19/16 1602  WBC 5.8 5.8 6.1 12.1*  HGB 11.2* 10.9* 11.0* 11.7*  HCT 32.6* 31.2* 31.9* 34.9*  PLT 114* 92* 79* 151    COAGS:  Recent Labs  06/25/16 0009 07/20/16 1052  INR 1.12 1.11    BMP:  Recent Labs  06/27/16 0512 06/28/16 0533 06/29/16 0508 07/19/16 1602  NA 138 137 137 130*  K 3.5 3.6 3.5 4.5  CL 108 107 108 96*  CO2 20* 21* 23 22  GLUCOSE 63* 163* 124* 121*  BUN 51* 50* 41* 56*  CALCIUM 8.3* 8.1* 8.1* 8.4*  CREATININE 3.41* 3.21* 2.79* 3.89*  GFRNONAA 15* 16* 19* 13*  GFRAA 17* 19* 22* 15*    LIVER FUNCTION TESTS:  Recent Labs  06/25/16 0009 07/19/16 1602  BILITOT 1.5* 1.1  AST 26 24  ALT 15* 16*  ALKPHOS 42 60  PROT 6.3* 6.7  ALBUMIN 3.3* 3.1*    TUMOR MARKERS: No results for input(s): AFPTM, CEA, CA199, CHROMGRNA in the last 8760 hours.  Assessment and Plan:  83 year old with evidence for cholecystitis and possibly gangrenous cholecystitis. In addition, there is a new fluid collection in the right upper quadrant which raises concern for a perforated gallbladder. Patient is not a surgical candidate at this time and surgery has requested a percutaneous cholecystostomy tube and percutaneous drainage of the abdominal  fluid collection/abscess. I explained the percutaneous drain placements with the patient and his wife in depth. I explained that the percutaneous abscess drain would likely be in place for 1-2 weeks. The cholecystostomy tube would likely be in place for a minimum of 6-8 weeks but may be chronic if he is not a surgical candidate. The patient does have gallstones and he would probably be at risk for recurrent cholecystitis if the drain is removed in the future. Patient and his wife understand that the decision about keeping the cholecystostomy tube would need to be made in the future. Patient and wife have a good understanding of the procedure and consent was obtained. Plan for CT guided drainage of the right upper quadrant fluid collection/abscess and a CT-guided cholecystostomy tube placement.  Thank you for this interesting consult.  I greatly enjoyed meeting KALLEN MCCRYSTAL and look forward to participating in their care.  A copy of this report was sent to the requesting provider on this date.  Electronically Signed: Carylon Perches 07/20/2016, 11:23 AM   I spent a total of 20 Minutes    in face to face in clinical consultation, greater than 50% of which was counseling/coordinating care for CT-guided drain placements.

## 2016-07-20 NOTE — Progress Notes (Signed)
[facility] [place] NAME: [name]    MR#:  [id number]  DATE OF BIRTH:  10-12-1926  SUBJECTIVE:  CHIEF COMPLAINT:   Chief Complaint  Patient presents with  . Hypotension  . Abdominal Pain   - Admitted with abdominal pain, denies any pain now. No nausea or vomiting. -Plans for a cholecystostomy tube placement today  REVIEW OF SYSTEMS:  Review of Systems  Constitutional: Negative for chills, fever and malaise/fatigue.  HENT: Negative for hearing loss and tinnitus.   Eyes: Negative for blurred vision and double vision.  Respiratory: Negative for cough, shortness of breath and wheezing.   Cardiovascular: Negative for chest pain, palpitations and leg swelling.  Gastrointestinal: Positive for abdominal pain and vomiting. Negative for constipation, diarrhea and nausea.  Genitourinary: Negative for dysuria.  Neurological: Negative for dizziness, speech change, focal weakness, seizures and headaches.  Psychiatric/Behavioral: Negative for depression.    DRUG ALLERGIES:   Allergies  Allergen Reactions  . Ativan [Lorazepam] Other (See Comments)    Hallucinations, combative  . Penicillin G Other (See Comments)    Has patient had a PCN reaction causing immediate rash, facial/tongue/throat swelling, SOB or lightheadedness with hypotension: Yes Has patient had a PCN reaction causing severe rash involving mucus membranes or skin necrosis: No Has patient had a PCN reaction that required hospitalization No Has patient had a PCN reaction occurring within the last 10 years: No If all of the above answers are "NO", then may proceed with Cephalosporin use.     VITALS:  Blood pressure (!) 128/52, pulse 75, temperature 98 F (36.7 C), temperature source Oral, resp. rate 20, height 5\' 7"  (1.702 m), weight 66.9 kg (147 lb 6.4 oz), SpO2 97 %.  PHYSICAL EXAMINATION:  Physical Exam  GENERAL:  84 y.o.-year-old patient lying in the bed with no  acute distress.  EYES: Pupils equal, round, reactive to light and accommodation. No scleral icterus. Extraocular muscles intact.  HEENT: Head atraumatic, normocephalic. Oropharynx and nasopharynx clear.  NECK:  Supple, no jugular venous distention. No thyroid enlargement, no tenderness.  LUNGS: Normal breath sounds bilaterally, no wheezing, rales,rhonchi or crepitation. No use of accessory muscles of respiration.  CARDIOVASCULAR: S1, S2 normal. No murmurs, rubs, or gallops.  ABDOMEN: Soft, nontender, Only minimal discomfort in right upper quadrant region, nondistended. Bowel sounds present. No organomegaly or mass.  EXTREMITIES: No pedal edema, cyanosis, or clubbing.  NEUROLOGIC: Cranial nerves II through XII are intact. Muscle strength 5/5 in all extremities. Sensation intact. Gait not checked.  PSYCHIATRIC: The patient is alert and oriented x 3.  SKIN: No obvious rash, lesion, or ulcer.    LABORATORY PANEL:   CBC  Recent Labs Lab 07/19/16 1602  WBC 12.1*  HGB 11.7*  HCT 34.9*  PLT 151   ------------------------------------------------------------------------------------------------------------------  Chemistries   Recent Labs Lab 07/19/16 1602  NA 130*  K 4.5  CL 96*  CO2 22  GLUCOSE 121*  BUN 56*  CREATININE 3.89*  CALCIUM 8.4*  AST 24  ALT 16*  ALKPHOS 60  BILITOT 1.1   ------------------------------------------------------------------------------------------------------------------  Cardiac Enzymes  Recent Labs Lab 07/20/16 0709  TROPONINI 0.03*   ------------------------------------------------------------------------------------------------------------------  RADIOLOGY:  Ct Abdomen Pelvis Wo Contrast  Result Date: 07/20/2016 CLINICAL DATA:  Fever and low blood pressure. Mild cough. Abdominal pain. EXAM: CT ABDOMEN AND PELVIS WITHOUT CONTRAST TECHNIQUE: Multidetector CT imaging of the abdomen and pelvis was performed following the standard protocol  without IV contrast. COMPARISON:  06/25/2016 FINDINGS:  Lower chest: Atelectasis in the lung bases. Coronary artery calcifications. Large esophageal hiatal hernia containing much of the stomach. Hepatobiliary: Multiple stones in the gallbladder. Small amount of gas in the gallbladder. Gallbladder wall is thickened. Changes consistent with acute cholecystitis. Gas within the gallbladder may arise from fistula or gangrenous cholecystitis. There is infiltration in the fat inferior to the gallbladder with a loculated fluid collection containing small gas bubbles. The collection measures about 5.9 x 7.7 cm. This likely represents an abscess, probably arising from perforated cholecystitis. Findings are progressing since previous study. There is associated inflammatory thickening of the hepatic flexure of the colon which is likely reactive. No bile duct dilatation. No focal liver lesions. Pancreas: Pancreas is atrophic. No peripancreatic collection or inflammatory change. No pancreatic ductal dilatation. Spleen: Normal in size without focal abnormality. Adrenals/Urinary Tract: Adrenal glands are prominent in size, greater on the left but no distinct nodule is identified. The left kidney is atrophic with hydronephrosis and hydroureter. Multiple stones are demonstrated in the distal left ureter, largest measuring 5 mm diameter. There is also large stone in the left renal pelvis measuring 1.5 cm diameter. These findings are unchanged since the previous study and suggest chronic left renal obstruction. There is a punctate size stone in the upper pole right kidney. No hydronephrosis or hydroureter. Bladder wall is not thickened and no bladder stones are identified. Stomach/Bowel: Stomach, small bowel, and colon are not abnormally distended. Diverticulosis of the sigmoid colon. No evidence of diverticulitis. Appendix is not identified. Vascular/Lymphatic: Aortic atherosclerosis. No enlarged abdominal or pelvic lymph nodes.  Reproductive: Prostate gland is enlarged, measuring 4.4 cm diameter. Other: Prominent visceral abdominal adipose tissue. No free air or free fluid in the abdomen. Abdominal wall musculature is thinned but intact. Musculoskeletal: No destructive bone lesions. IMPRESSION: 1. Cholelithiasis with changes of cholecystitis including gas within thickened gallbladder wall suggesting gangrenous cholecystitis. There is a developing fluid collection with small gas bubbles inferior to the gallbladder, measuring 5.9 x 7.7 cm. This is consistent with a developing abscess, likely arising from the perforated cholecystitis. There is progression since the previous study. There is associated inflammatory wall thickening of the hepatic flexure of the colon. 2. Left renal atrophy with hydronephrosis and hydroureter. Stones demonstrated in the left renal pelvis and distal ureter. Changes suggest chronic obstruction. Nonobstructing punctate stone in the right kidney. 3. Large esophageal hiatal hernia containing most of the stomach. Electronically Signed   By: Lucienne Capers M.D.   On: 07/20/2016 00:55   Dg Chest Port 1 View  Result Date: 07/19/2016 CLINICAL DATA:  Low BP EXAM: PORTABLE CHEST 1 VIEW COMPARISON:  06/28/2016 FINDINGS: Mild cardiomegaly. No overt failure. Lobulation of the right diaphragm. No consolidation or large effusion. No pneumothorax. Large hiatal hernia. IMPRESSION: 1. Mild cardiomegaly without overt failure.  No acute infiltrate 2. Moderate large hiatal hernia Electronically Signed   By: Donavan Foil M.D.   On: 07/19/2016 19:54    EKG:   Orders placed or performed during the hospital encounter of 07/19/16  . ED EKG  . ED EKG    ASSESSMENT AND PLAN:   83 year old male with past medical history significant for paroxysmal atrial fibrillation not on anticoagulation, diastolic CHF, hypertension, CK D, osteoporosis with recent admission to Hospital last month for same complaints comes back again with  abdominal pain nausea and vomiting.  #1 acute gangrenous cholecystitis-seen last month with similar complaints but HIDA scan was normal at the time. -Given his high risk for surgery, he was  treated conservatively and discharged on oral ciprofloxacin. He was doing fine up until the symptoms returned again. -CT of the abdomen this admission showing findings consistent with possible gangrenous cholecystitis and worse from last admission. -Appreciate surgical consult. Patient will have a CT-guided drainage and cholecystostomy tube. -Antibiotics-change to meropenem.  Discontinue vancomycin and aztreonam at this point - Blood cultures are pending.  #2 acute renal failure on CK D-avoid nephrotoxic agents. Baseline creatinine seems to be around 2.8. -Now creatinine at 3.9. Gentle hydration and monitor. - continue sodium bicarb  #3 congestive heart failure-recent echo showing diastolic CHF. Decrease IV fluids rate.  Continue torsemide  #4 hypothyroidism-on Synthroid  #5 GERD-on Protonix twice a day  #6 DVT prophylaxis- teds and SCDs for now. We'll start subcutaneous heparin after the procedure.    All the records are reviewed and case discussed with Care Management/Social Workerr. Management plans discussed with the patient, family and they are in agreement.  CODE STATUS: Full code  TOTAL TIME TAKING CARE OF THIS PATIENT: 36 minutes.   POSSIBLE D/C IN 2 DAYS, DEPENDING ON CLINICAL CONDITION.   Gladstone Lighter M.D on 07/20/2016 at 10:38 AM  Between 7am to 6pm - Pager - [phone]  After 6pm go to [email or web address] - password [email or web address] [facility]  Office  [phone]  CC: Primary care physician; Vista Mink, FNP

## 2016-07-21 LAB — BASIC METABOLIC PANEL
ANION GAP: 10 (ref 5–15)
BUN: 48 mg/dL — ABNORMAL HIGH (ref 6–20)
CALCIUM: 8.1 mg/dL — AB (ref 8.9–10.3)
CO2: 20 mmol/L — AB (ref 22–32)
Chloride: 105 mmol/L (ref 101–111)
Creatinine, Ser: 3.15 mg/dL — ABNORMAL HIGH (ref 0.61–1.24)
GFR calc non Af Amer: 16 mL/min — ABNORMAL LOW (ref >60)
GFR, EST AFRICAN AMERICAN: 19 mL/min — AB (ref >60)
GLUCOSE: 100 mg/dL — AB (ref 65–99)
POTASSIUM: 3.4 mmol/L — AB (ref 3.5–5.1)
Sodium: 135 mmol/L (ref 135–145)

## 2016-07-21 LAB — CBC
HEMATOCRIT: 32.2 % — AB (ref 40.0–52.0)
HEMOGLOBIN: 11.1 g/dL — AB (ref 13.0–18.0)
MCH: 29.8 pg (ref 26.0–34.0)
MCHC: 34.6 g/dL (ref 32.0–36.0)
MCV: 86.2 fL (ref 80.0–100.0)
Platelets: 152 10*3/uL (ref 150–440)
RBC: 3.74 MIL/uL — ABNORMAL LOW (ref 4.40–5.90)
RDW: 15.2 % — ABNORMAL HIGH (ref 11.5–14.5)
WBC: 7.4 10*3/uL (ref 3.8–10.6)

## 2016-07-21 LAB — CREATININE, SERUM
CREATININE: 3.29 mg/dL — AB (ref 0.61–1.24)
GFR calc Af Amer: 18 mL/min — ABNORMAL LOW (ref >60)
GFR calc non Af Amer: 16 mL/min — ABNORMAL LOW (ref >60)

## 2016-07-21 LAB — URINE CULTURE

## 2016-07-21 MED ORDER — HEPARIN SODIUM (PORCINE) 5000 UNIT/ML IJ SOLN
5000.0000 [IU] | Freq: Three times a day (TID) | INTRAMUSCULAR | Status: DC
  Administered 2016-07-21 – 2016-07-23 (×8): 5000 [IU] via SUBCUTANEOUS
  Filled 2016-07-21 (×8): qty 1

## 2016-07-21 NOTE — Progress Notes (Signed)
07/21/2016  Subjective: No acute events overnight.  Patient went to IR and had a percutaneous cholecystostomy tube placed as well as a percutaneous abscess drainage catheter placed.  Today reports he feels better with decreased pain.  Tolerated clear liquids without nausea or worsening pain.  Vital signs: Temp:  [98.1 F (36.7 C)-99.2 F (37.3 C)] 98.6 F (37 C) (01/13 0744) Pulse Rate:  [70-82] 70 (01/13 0744) Resp:  [18-26] 18 (01/13 0744) BP: (105-132)/(40-65) 113/50 (01/13 0744) SpO2:  [93 %-100 %] 96 % (01/13 0744) FiO2 (%):  [28 %] 28 % (01/12 1710) Weight:  [67 kg (147 lb 11.2 oz)] 67 kg (147 lb 11.2 oz) (01/13 0500)   Intake/Output: 01/12 0701 - 01/13 0700 In: 1159.3 [I.V.:1154.3] Out: 545 [Urine:325; Drains:220] Last BM Date: 07/20/16  Physical Exam: Constitutional:  No acute distress Abdomen:  Soft, nondistended, appropriately tender in the RUQ around the catheter insertion sites.  Negative Murphy's sign.  Abscess catheter with purulent fluid, cholecystostomy tube with bilious fluid.  Labs:   Recent Labs  07/19/16 1602  WBC 12.1*  HGB 11.7*  HCT 34.9*  PLT 151    Recent Labs  07/19/16 1602 07/21/16 0550  NA 130*  --   K 4.5  --   CL 96*  --   CO2 22  --   GLUCOSE 121*  --   BUN 56*  --   CREATININE 3.89* 3.29*  CALCIUM 8.4*  --     Recent Labs  07/20/16 1052  LABPROT 14.3  INR 1.11    Imaging: Ct Image Guided Drainage By Percutaneous Catheter  Result Date: 07/20/2016 INDICATION: 83 year old with acute cholecystitis and suspected gallbladder perforation. EXAM: CT-GUIDED PERCUTANEOUS CHOLECYSTOSTOMY TUBE PLACEMENT CT-GUIDED DRAIN PLACEMENT IN RIGHT ABDOMINAL ABSCESS MEDICATIONS: Inpatient and already on antibiotics. ANESTHESIA/SEDATION: Moderate (conscious) sedation was employed during this procedure. A total of Versed 1.0 mg and Fentanyl 37.5 mcg was administered intravenously. Moderate Sedation Time: 41 minutes. The patient's level of  consciousness and vital signs were monitored continuously by radiology nursing throughout the procedure under my direct supervision. FLUOROSCOPY TIME:  None COMPLICATIONS: None immediate. PROCEDURE: Informed written consent was obtained from the patient after a thorough discussion of the procedural risks, benefits and alternatives. All questions were addressed. Maximal Sterile Barrier Technique was utilized including caps, mask, sterile gowns, sterile gloves, sterile drape, hand hygiene and skin antiseptic. A timeout was performed prior to the initiation of the procedure. Patient was placed on the CT scanner with the right side mildly elevated. Images through the right upper abdomen were obtained. The skin was marked in 2 different areas. The skin was prepped with chlorhexidine and a sterile field was created. Skin was anesthetized with 1% lidocaine. 18 gauge needle was directed into the right upper quadrant abscess. Needle position was confirmed within the collection. However, no fluid could be aspirated from this collection. Wire was advanced into the collection. Tract was dilated to accommodate a 10.2 [place] multipurpose drain. Following placement of the drain, large amount of thick brown fluid was aspirated. Approximately 80 mL of thick brown fluid was aspirated from this right upper quadrant collection. Catheter was attached to a suction bulb. Attention was directed to placement of a cholecystostomy tube. Skin was anesthetized with 1% lidocaine. 18 gauge needle was directed into the gallbladder fundus with CT guidance. Thick brown fluid was aspirated from the gallbladder and this is similar to the abscess fluid. Stiff Amplatz wire was placed and the tract was dilated to accommodate a 10.2 [place]  multipurpose drain. Approximately 20 mL of thick brown fluid was removed from the gallbladder. Both drains were irrigated with normal saline. Both catheters were sutured to skin. Cholecystostomy tube attached to gravity  bag. FINDINGS: Complex air-fluid collection in the right upper quadrant adjacent to the gallbladder. Drain was successfully placed within this collection and 80 mL of thick brown fluid was removed. Cholecystostomy tube was successfully placed within the gallbladder. Evidence for gallstones. IMPRESSION: Successful placement of a CT-guided cholecystostomy tube and CT-guided abscess drain in the right upper quadrant. Findings are compatible with cholecystitis and gallbladder perforation. Recommend flushing both catheters while the patient is in the hospital in order to help facilitate drainage of this thick brown material. Recommend follow-up CT in approximately 1 week to evaluate the right upper quadrant abscess collection. Electronically Signed   By: Markus Daft M.D.   On: 07/20/2016 15:46   Ct Image Guided Drainage Percut Cath  Peritoneal Retroperit  Result Date: 07/20/2016 INDICATION: 83 year old with acute cholecystitis and suspected gallbladder perforation. EXAM: CT-GUIDED PERCUTANEOUS CHOLECYSTOSTOMY TUBE PLACEMENT CT-GUIDED DRAIN PLACEMENT IN RIGHT ABDOMINAL ABSCESS MEDICATIONS: Inpatient and already on antibiotics. ANESTHESIA/SEDATION: Moderate (conscious) sedation was employed during this procedure. A total of Versed 1.0 mg and Fentanyl 37.5 mcg was administered intravenously. Moderate Sedation Time: 41 minutes. The patient's level of consciousness and vital signs were monitored continuously by radiology nursing throughout the procedure under my direct supervision. FLUOROSCOPY TIME:  None COMPLICATIONS: None immediate. PROCEDURE: Informed written consent was obtained from the patient after a thorough discussion of the procedural risks, benefits and alternatives. All questions were addressed. Maximal Sterile Barrier Technique was utilized including caps, mask, sterile gowns, sterile gloves, sterile drape, hand hygiene and skin antiseptic. A timeout was performed prior to the initiation of the procedure.  Patient was placed on the CT scanner with the right side mildly elevated. Images through the right upper abdomen were obtained. The skin was marked in 2 different areas. The skin was prepped with chlorhexidine and a sterile field was created. Skin was anesthetized with 1% lidocaine. 18 gauge needle was directed into the right upper quadrant abscess. Needle position was confirmed within the collection. However, no fluid could be aspirated from this collection. Wire was advanced into the collection. Tract was dilated to accommodate a 10.2 [place] multipurpose drain. Following placement of the drain, large amount of thick brown fluid was aspirated. Approximately 80 mL of thick brown fluid was aspirated from this right upper quadrant collection. Catheter was attached to a suction bulb. Attention was directed to placement of a cholecystostomy tube. Skin was anesthetized with 1% lidocaine. 18 gauge needle was directed into the gallbladder fundus with CT guidance. Thick brown fluid was aspirated from the gallbladder and this is similar to the abscess fluid. Stiff Amplatz wire was placed and the tract was dilated to accommodate a 10.2 [place] multipurpose drain. Approximately 20 mL of thick brown fluid was removed from the gallbladder. Both drains were irrigated with normal saline. Both catheters were sutured to skin. Cholecystostomy tube attached to gravity bag. FINDINGS: Complex air-fluid collection in the right upper quadrant adjacent to the gallbladder. Drain was successfully placed within this collection and 80 mL of thick brown fluid was removed. Cholecystostomy tube was successfully placed within the gallbladder. Evidence for gallstones. IMPRESSION: Successful placement of a CT-guided cholecystostomy tube and CT-guided abscess drain in the right upper quadrant. Findings are compatible with cholecystitis and gallbladder perforation. Recommend flushing both catheters while the patient is in the hospital in order to help  facilitate drainage of this thick brown material. Recommend follow-up CT in approximately 1 week to evaluate the right upper quadrant abscess collection. Electronically Signed   By: Markus Daft M.D.   On: 07/20/2016 15:46    Assessment/Plan: 83 yo male with acute cholecystitis and gallbladder abscess, s/p perc cholecystostomy tube and abscess drainage catheters.  --Advance diet as tolerated.  Have started full liquids for lunch.  May have low fat diet for dinner if tolerates. --Continue IV antibiotics today.  Waiting for cultures to come back. --Possibly transition to po abx tomorrow and discharge in 1-2 days pending how his clinical condition improves.   Melvyn Neth, [place]

## 2016-07-21 NOTE — Progress Notes (Signed)
[facility] NAME: [name]    MR#:  [id number]  DATE OF BIRTH:  December 11, 1926  SUBJECTIVE:  CHIEF COMPLAINT:   Chief Complaint  Patient presents with  . Hypotension  . Abdominal Pain   - Status post percutaneous cholecystostomy tube and abscess drainage tube placed in right upper quadrant yesterday. -Pain has resolved. Appears congested and also coughing this morning.  REVIEW OF SYSTEMS:  Review of Systems  Constitutional: Positive for malaise/fatigue. Negative for chills and fever.  HENT: Negative for hearing loss and tinnitus.   Eyes: Negative for blurred vision and double vision.  Respiratory: Positive for cough. Negative for shortness of breath and wheezing.   Cardiovascular: Negative for chest pain, palpitations and leg swelling.  Gastrointestinal: Positive for abdominal pain. Negative for constipation, diarrhea, nausea and vomiting.  Genitourinary: Negative for dysuria.  Neurological: Negative for dizziness, speech change, focal weakness, seizures and headaches.  Psychiatric/Behavioral: Negative for depression.    DRUG ALLERGIES:   Allergies  Allergen Reactions  . Ativan [Lorazepam] Other (See Comments)    Hallucinations, combative  . Penicillin G Other (See Comments)    Has patient had a PCN reaction causing immediate rash, facial/tongue/throat swelling, SOB or lightheadedness with hypotension: Yes Has patient had a PCN reaction causing severe rash involving mucus membranes or skin necrosis: No Has patient had a PCN reaction that required hospitalization No Has patient had a PCN reaction occurring within the last 10 years: No If all of the above answers are "NO", then may proceed with Cephalosporin use.     VITALS:  Blood pressure (!) 113/50, pulse 70, temperature 98.6 F (37 C), temperature source Oral, resp. rate 18, height 5\' 7"  (1.702 m), weight 67 kg (147 lb 11.2 oz), SpO2 96 %.  PHYSICAL EXAMINATION:    Physical Exam  GENERAL:  83 y.o.-year-old patient lying in the bed with no acute distress.  EYES: Pupils equal, round, reactive to light and accommodation. No scleral icterus. Extraocular muscles intact.  HEENT: Head atraumatic, normocephalic. Oropharynx and nasopharynx clear.  NECK:  Supple, no jugular venous distention. No thyroid enlargement, no tenderness.  LUNGS: Coarse breath sounds bilaterally, no wheezing or crepitation. No use of accessory muscles of respiration.  CARDIOVASCULAR: S1, S2 normal. No murmurs, rubs, or gallops.  ABDOMEN: Soft, nontender, Only minimal discomfort in right upper quadrant region, nondistended. Bowel sounds present. No organomegaly or mass.  EXTREMITIES: No pedal edema, cyanosis, or clubbing.  NEUROLOGIC: Cranial nerves II through XII are intact. Muscle strength 5/5 in all extremities. Sensation intact. Gait not checked.  PSYCHIATRIC: The patient is alert and oriented x 2-3.  SKIN: No obvious rash, lesion, or ulcer.    LABORATORY PANEL:   CBC  Recent Labs Lab 07/19/16 1602  WBC 12.1*  HGB 11.7*  HCT 34.9*  PLT 151   ------------------------------------------------------------------------------------------------------------------  Chemistries   Recent Labs Lab 07/19/16 1602 07/21/16 0550  NA 130*  --   K 4.5  --   CL 96*  --   CO2 22  --   GLUCOSE 121*  --   BUN 56*  --   CREATININE 3.89* 3.29*  CALCIUM 8.4*  --   AST 24  --   ALT 16*  --   ALKPHOS 60  --   BILITOT 1.1  --    ------------------------------------------------------------------------------------------------------------------  Cardiac Enzymes  Recent Labs Lab 07/20/16 1825  TROPONINI 0.03*   ------------------------------------------------------------------------------------------------------------------  RADIOLOGY:  Ct Abdomen Pelvis Wo Contrast  Result Date: 07/20/2016 CLINICAL DATA:  Fever and low blood pressure. Mild cough. Abdominal pain. EXAM: CT  ABDOMEN AND PELVIS WITHOUT CONTRAST TECHNIQUE: Multidetector CT imaging of the abdomen and pelvis was performed following the standard protocol without IV contrast. COMPARISON:  06/25/2016 FINDINGS: Lower chest: Atelectasis in the lung bases. Coronary artery calcifications. Large esophageal hiatal hernia containing much of the stomach. Hepatobiliary: Multiple stones in the gallbladder. Small amount of gas in the gallbladder. Gallbladder wall is thickened. Changes consistent with acute cholecystitis. Gas within the gallbladder may arise from fistula or gangrenous cholecystitis. There is infiltration in the fat inferior to the gallbladder with a loculated fluid collection containing small gas bubbles. The collection measures about 5.9 x 7.7 cm. This likely represents an abscess, probably arising from perforated cholecystitis. Findings are progressing since previous study. There is associated inflammatory thickening of the hepatic flexure of the colon which is likely reactive. No bile duct dilatation. No focal liver lesions. Pancreas: Pancreas is atrophic. No peripancreatic collection or inflammatory change. No pancreatic ductal dilatation. Spleen: Normal in size without focal abnormality. Adrenals/Urinary Tract: Adrenal glands are prominent in size, greater on the left but no distinct nodule is identified. The left kidney is atrophic with hydronephrosis and hydroureter. Multiple stones are demonstrated in the distal left ureter, largest measuring 5 mm diameter. There is also large stone in the left renal pelvis measuring 1.5 cm diameter. These findings are unchanged since the previous study and suggest chronic left renal obstruction. There is a punctate size stone in the upper pole right kidney. No hydronephrosis or hydroureter. Bladder wall is not thickened and no bladder stones are identified. Stomach/Bowel: Stomach, small bowel, and colon are not abnormally distended. Diverticulosis of the sigmoid colon. No  evidence of diverticulitis. Appendix is not identified. Vascular/Lymphatic: Aortic atherosclerosis. No enlarged abdominal or pelvic lymph nodes. Reproductive: Prostate gland is enlarged, measuring 4.4 cm diameter. Other: Prominent visceral abdominal adipose tissue. No free air or free fluid in the abdomen. Abdominal wall musculature is thinned but intact. Musculoskeletal: No destructive bone lesions. IMPRESSION: 1. Cholelithiasis with changes of cholecystitis including gas within thickened gallbladder wall suggesting gangrenous cholecystitis. There is a developing fluid collection with small gas bubbles inferior to the gallbladder, measuring 5.9 x 7.7 cm. This is consistent with a developing abscess, likely arising from the perforated cholecystitis. There is progression since the previous study. There is associated inflammatory wall thickening of the hepatic flexure of the colon. 2. Left renal atrophy with hydronephrosis and hydroureter. Stones demonstrated in the left renal pelvis and distal ureter. Changes suggest chronic obstruction. Nonobstructing punctate stone in the right kidney. 3. Large esophageal hiatal hernia containing most of the stomach. Electronically Signed   By: Lucienne Capers M.D.   On: 07/20/2016 00:55   Dg Chest Port 1 View  Result Date: 07/19/2016 CLINICAL DATA:  Low BP EXAM: PORTABLE CHEST 1 VIEW COMPARISON:  06/28/2016 FINDINGS: Mild cardiomegaly. No overt failure. Lobulation of the right diaphragm. No consolidation or large effusion. No pneumothorax. Large hiatal hernia. IMPRESSION: 1. Mild cardiomegaly without overt failure.  No acute infiltrate 2. Moderate large hiatal hernia Electronically Signed   By: Donavan Foil M.D.   On: 07/19/2016 19:54   Ct Image Guided Drainage By Percutaneous Catheter  Result Date: 07/20/2016 INDICATION: 83 year old with acute cholecystitis and suspected gallbladder perforation. EXAM: CT-GUIDED PERCUTANEOUS CHOLECYSTOSTOMY TUBE PLACEMENT CT-GUIDED DRAIN  PLACEMENT IN RIGHT ABDOMINAL ABSCESS MEDICATIONS: Inpatient and already on antibiotics. ANESTHESIA/SEDATION: Moderate (conscious) sedation was employed during this procedure. A total  of Versed 1.0 mg and Fentanyl 37.5 mcg was administered intravenously. Moderate Sedation Time: 41 minutes. The patient's level of consciousness and vital signs were monitored continuously by radiology nursing throughout the procedure under my direct supervision. FLUOROSCOPY TIME:  None COMPLICATIONS: None immediate. PROCEDURE: Informed written consent was obtained from the patient after a thorough discussion of the procedural risks, benefits and alternatives. All questions were addressed. Maximal Sterile Barrier Technique was utilized including caps, mask, sterile gowns, sterile gloves, sterile drape, hand hygiene and skin antiseptic. A timeout was performed prior to the initiation of the procedure. Patient was placed on the CT scanner with the right side mildly elevated. Images through the right upper abdomen were obtained. The skin was marked in 2 different areas. The skin was prepped with chlorhexidine and a sterile field was created. Skin was anesthetized with 1% lidocaine. 18 gauge needle was directed into the right upper quadrant abscess. Needle position was confirmed within the collection. However, no fluid could be aspirated from this collection. Wire was advanced into the collection. Tract was dilated to accommodate a 10.2 [place] multipurpose drain. Following placement of the drain, large amount of thick brown fluid was aspirated. Approximately 80 mL of thick brown fluid was aspirated from this right upper quadrant collection. Catheter was attached to a suction bulb. Attention was directed to placement of a cholecystostomy tube. Skin was anesthetized with 1% lidocaine. 18 gauge needle was directed into the gallbladder fundus with CT guidance. Thick brown fluid was aspirated from the gallbladder and this is similar to the  abscess fluid. Stiff Amplatz wire was placed and the tract was dilated to accommodate a 10.2 [place] multipurpose drain. Approximately 20 mL of thick brown fluid was removed from the gallbladder. Both drains were irrigated with normal saline. Both catheters were sutured to skin. Cholecystostomy tube attached to gravity bag. FINDINGS: Complex air-fluid collection in the right upper quadrant adjacent to the gallbladder. Drain was successfully placed within this collection and 80 mL of thick brown fluid was removed. Cholecystostomy tube was successfully placed within the gallbladder. Evidence for gallstones. IMPRESSION: Successful placement of a CT-guided cholecystostomy tube and CT-guided abscess drain in the right upper quadrant. Findings are compatible with cholecystitis and gallbladder perforation. Recommend flushing both catheters while the patient is in the hospital in order to help facilitate drainage of this thick brown material. Recommend follow-up CT in approximately 1 week to evaluate the right upper quadrant abscess collection. Electronically Signed   By: Markus Daft M.D.   On: 07/20/2016 15:46   Ct Image Guided Drainage Percut Cath  Peritoneal Retroperit  Result Date: 07/20/2016 INDICATION: 83 year old with acute cholecystitis and suspected gallbladder perforation. EXAM: CT-GUIDED PERCUTANEOUS CHOLECYSTOSTOMY TUBE PLACEMENT CT-GUIDED DRAIN PLACEMENT IN RIGHT ABDOMINAL ABSCESS MEDICATIONS: Inpatient and already on antibiotics. ANESTHESIA/SEDATION: Moderate (conscious) sedation was employed during this procedure. A total of Versed 1.0 mg and Fentanyl 37.5 mcg was administered intravenously. Moderate Sedation Time: 41 minutes. The patient's level of consciousness and vital signs were monitored continuously by radiology nursing throughout the procedure under my direct supervision. FLUOROSCOPY TIME:  None COMPLICATIONS: None immediate. PROCEDURE: Informed written consent was obtained from the patient after a  thorough discussion of the procedural risks, benefits and alternatives. All questions were addressed. Maximal Sterile Barrier Technique was utilized including caps, mask, sterile gowns, sterile gloves, sterile drape, hand hygiene and skin antiseptic. A timeout was performed prior to the initiation of the procedure. Patient was placed on the CT scanner with the right side mildly elevated. Images  through the right upper abdomen were obtained. The skin was marked in 2 different areas. The skin was prepped with chlorhexidine and a sterile field was created. Skin was anesthetized with 1% lidocaine. 18 gauge needle was directed into the right upper quadrant abscess. Needle position was confirmed within the collection. However, no fluid could be aspirated from this collection. Wire was advanced into the collection. Tract was dilated to accommodate a 10.2 [place] multipurpose drain. Following placement of the drain, large amount of thick brown fluid was aspirated. Approximately 80 mL of thick brown fluid was aspirated from this right upper quadrant collection. Catheter was attached to a suction bulb. Attention was directed to placement of a cholecystostomy tube. Skin was anesthetized with 1% lidocaine. 18 gauge needle was directed into the gallbladder fundus with CT guidance. Thick brown fluid was aspirated from the gallbladder and this is similar to the abscess fluid. Stiff Amplatz wire was placed and the tract was dilated to accommodate a 10.2 [place] multipurpose drain. Approximately 20 mL of thick brown fluid was removed from the gallbladder. Both drains were irrigated with normal saline. Both catheters were sutured to skin. Cholecystostomy tube attached to gravity bag. FINDINGS: Complex air-fluid collection in the right upper quadrant adjacent to the gallbladder. Drain was successfully placed within this collection and 80 mL of thick brown fluid was removed. Cholecystostomy tube was successfully placed within the  gallbladder. Evidence for gallstones. IMPRESSION: Successful placement of a CT-guided cholecystostomy tube and CT-guided abscess drain in the right upper quadrant. Findings are compatible with cholecystitis and gallbladder perforation. Recommend flushing both catheters while the patient is in the hospital in order to help facilitate drainage of this thick brown material. Recommend follow-up CT in approximately 1 week to evaluate the right upper quadrant abscess collection. Electronically Signed   By: Markus Daft M.D.   On: 07/20/2016 15:46    EKG:   Orders placed or performed during the hospital encounter of 07/19/16  . ED EKG  . ED EKG    ASSESSMENT AND PLAN:   83 year old male with past medical history significant for paroxysmal atrial fibrillation not on anticoagulation, diastolic CHF, hypertension, CK D, osteoporosis with recent admission to Hospital last month for same complaints comes back again with abdominal pain nausea and vomiting.  #1 acute gangrenous cholecystitis- Given high risk for open surgery, patient had IR place a cholecystostomy tube and also an abscess drainage tube yesterday. -Cultures are pending at this time. Appreciate IR and surgical consults. -Currently on meropenem. Likely has perforated gangrenous cholecystitis. -CT of the abdomen this admission showing findings consistent with possible gangrenous cholecystitis and worse from last admission. - Blood cultures are pending. -On full liquid diet and being advanced as tolerated. Wait until the culture results are back to start by mouth antibiotics  #2 acute renal failure on CK D-avoid nephrotoxic agents. Baseline creatinine seems to be around 2.8. -Creatinine improved with IV fluids. Discontinue fluids today is starting to sound congested - continue sodium bicarb  #3 congestive heart failure-recent echo showing diastolic CHF. Stop IV fluids today as slightly congested. Check portable chest x-ray. Continue torsemide  orally  #4 hypothyroidism-on Synthroid  #5 GERD-on Protonix twice a day  #6 DVT prophylaxis- teds and SCDs and subcutaneous heparin   Physical therapy consulted..    All the records are reviewed and case discussed with Care Management/Social Workerr. Management plans discussed with the patient, family and they are in agreement.  CODE STATUS: Full code  TOTAL TIME TAKING CARE OF  THIS PATIENT: 36 minutes.   POSSIBLE D/C IN 1-2 DAYS, DEPENDING ON CLINICAL CONDITION.   Gladstone Lighter M.D on 07/21/2016 at 9:31 AM  Between 7am to 6pm - Pager - [phone]  After 6pm go to [email or web address] - password [email or web address] [facility]  Office  [phone]  CC: Primary care physician; Vista Mink, FNP

## 2016-07-21 NOTE — Progress Notes (Signed)
Family with patient all day.  Output from JP and drain minimal.  Patient is hoping to be discharged by mon 07/23/16

## 2016-07-22 ENCOUNTER — Inpatient Hospital Stay: Payer: Medicare Other

## 2016-07-22 LAB — BASIC METABOLIC PANEL
ANION GAP: 10 (ref 5–15)
BUN: 44 mg/dL — ABNORMAL HIGH (ref 6–20)
CALCIUM: 8 mg/dL — AB (ref 8.9–10.3)
CO2: 23 mmol/L (ref 22–32)
CREATININE: 3.02 mg/dL — AB (ref 0.61–1.24)
Chloride: 102 mmol/L (ref 101–111)
GFR, EST AFRICAN AMERICAN: 20 mL/min — AB (ref >60)
GFR, EST NON AFRICAN AMERICAN: 17 mL/min — AB (ref >60)
Glucose, Bld: 97 mg/dL (ref 65–99)
Potassium: 2.9 mmol/L — ABNORMAL LOW (ref 3.5–5.1)
SODIUM: 135 mmol/L (ref 135–145)

## 2016-07-22 MED ORDER — METRONIDAZOLE 500 MG PO TABS
500.0000 mg | ORAL_TABLET | Freq: Three times a day (TID) | ORAL | Status: DC
  Administered 2016-07-22 – 2016-07-23 (×4): 500 mg via ORAL
  Filled 2016-07-22 (×4): qty 1

## 2016-07-22 MED ORDER — POTASSIUM CHLORIDE CRYS ER 20 MEQ PO TBCR
20.0000 meq | EXTENDED_RELEASE_TABLET | Freq: Every day | ORAL | Status: DC
  Administered 2016-07-23: 20 meq via ORAL
  Filled 2016-07-22: qty 1

## 2016-07-22 MED ORDER — POTASSIUM CHLORIDE CRYS ER 20 MEQ PO TBCR
40.0000 meq | EXTENDED_RELEASE_TABLET | ORAL | Status: AC
  Administered 2016-07-22 (×2): 40 meq via ORAL
  Filled 2016-07-22 (×2): qty 2

## 2016-07-22 MED ORDER — CIPROFLOXACIN HCL 500 MG PO TABS
500.0000 mg | ORAL_TABLET | ORAL | Status: DC
  Administered 2016-07-22 – 2016-07-23 (×2): 500 mg via ORAL
  Filled 2016-07-22 (×2): qty 1

## 2016-07-22 NOTE — Progress Notes (Signed)
Antibiotic Renal Dose Adjustment  Based on CrCl of 16.1 mL/min ciprofloxacin 500 mg PO q12h was adjusted to ciprofloxacin 500 mg PO q18hours  Darrow Bussing, PharmD Pharmacy Resident 07/22/2016 9:46 AM

## 2016-07-22 NOTE — Progress Notes (Signed)
CC: I abdominal abscess w Cholecystitis Subjective: Feeling well AVSS 55 cc drain Taking PO  Objective: Vital signs in last 24 hours: Temp:  [97.5 F (36.4 C)-98.7 F (37.1 C)] 98.7 F (37.1 C) (01/14 0737) Pulse Rate:  [64-66] 66 (01/14 0737) Resp:  [18] 18 (01/14 0737) BP: (112-129)/(49-62) 112/62 (01/14 0737) SpO2:  [94 %-95 %] 94 % (01/14 0737) Weight:  [66.6 kg (146 lb 12.8 oz)] 66.6 kg (146 lb 12.8 oz) (01/14 0500) Last BM Date: 07/20/16  Intake/Output from previous day: 01/13 0701 - 01/14 0700 In: 2318.8 [P.O.:940; I.V.:1228.8; IV Piggyback:100] Out: 1455 [Urine:1400; Drains:55] Intake/Output this shift: Total I/O In: -  Out: 200 [Urine:200]  Physical exam: Elderly male in NAD Abd: soft, NT, drain w purulent material, cholecystostomy tube in place No peritonitis Ext: well perfused, non tender calfs Lab Results: CBC   Recent Labs  07/19/16 1602 07/21/16 0550  WBC 12.1* 7.4  HGB 11.7* 11.1*  HCT 34.9* 32.2*  PLT 151 152   BMET  Recent Labs  07/21/16 0550 07/22/16 0603  NA 135 135  K 3.4* 2.9*  CL 105 102  CO2 20* 23  GLUCOSE 100* 97  BUN 48* 44*  CREATININE 3.15*  3.29* 3.02*  CALCIUM 8.1* 8.0*   PT/INR  Recent Labs  07/20/16 1052  LABPROT 14.3  INR 1.11   ABG No results for input(s): PHART, HCO3 in the last 72 hours.  Invalid input(s): PCO2, PO2  Studies/Results: Ct Image Guided Drainage By Percutaneous Catheter  Result Date: 07/20/2016 INDICATION: 83 year old with acute cholecystitis and suspected gallbladder perforation. EXAM: CT-GUIDED PERCUTANEOUS CHOLECYSTOSTOMY TUBE PLACEMENT CT-GUIDED DRAIN PLACEMENT IN RIGHT ABDOMINAL ABSCESS MEDICATIONS: Inpatient and already on antibiotics. ANESTHESIA/SEDATION: Moderate (conscious) sedation was employed during this procedure. A total of Versed 1.0 mg and Fentanyl 37.5 mcg was administered intravenously. Moderate Sedation Time: 41 minutes. The patient's level of consciousness and vital signs  were monitored continuously by radiology nursing throughout the procedure under my direct supervision. FLUOROSCOPY TIME:  None COMPLICATIONS: None immediate. PROCEDURE: Informed written consent was obtained from the patient after a thorough discussion of the procedural risks, benefits and alternatives. All questions were addressed. Maximal Sterile Barrier Technique was utilized including caps, mask, sterile gowns, sterile gloves, sterile drape, hand hygiene and skin antiseptic. A timeout was performed prior to the initiation of the procedure. Patient was placed on the CT scanner with the right side mildly elevated. Images through the right upper abdomen were obtained. The skin was marked in 2 different areas. The skin was prepped with chlorhexidine and a sterile field was created. Skin was anesthetized with 1% lidocaine. 18 gauge needle was directed into the right upper quadrant abscess. Needle position was confirmed within the collection. However, no fluid could be aspirated from this collection. Wire was advanced into the collection. Tract was dilated to accommodate a 10.2 [place] multipurpose drain. Following placement of the drain, large amount of thick brown fluid was aspirated. Approximately 80 mL of thick brown fluid was aspirated from this right upper quadrant collection. Catheter was attached to a suction bulb. Attention was directed to placement of a cholecystostomy tube. Skin was anesthetized with 1% lidocaine. 18 gauge needle was directed into the gallbladder fundus with CT guidance. Thick brown fluid was aspirated from the gallbladder and this is similar to the abscess fluid. Stiff Amplatz wire was placed and the tract was dilated to accommodate a 10.2 [place] multipurpose drain. Approximately 20 mL of thick brown fluid was removed from the gallbladder. Both drains  were irrigated with normal saline. Both catheters were sutured to skin. Cholecystostomy tube attached to gravity bag. FINDINGS: Complex  air-fluid collection in the right upper quadrant adjacent to the gallbladder. Drain was successfully placed within this collection and 80 mL of thick brown fluid was removed. Cholecystostomy tube was successfully placed within the gallbladder. Evidence for gallstones. IMPRESSION: Successful placement of a CT-guided cholecystostomy tube and CT-guided abscess drain in the right upper quadrant. Findings are compatible with cholecystitis and gallbladder perforation. Recommend flushing both catheters while the patient is in the hospital in order to help facilitate drainage of this thick brown material. Recommend follow-up CT in approximately 1 week to evaluate the right upper quadrant abscess collection. Electronically Signed   By: Markus Daft M.D.   On: 07/20/2016 15:46   Ct Image Guided Drainage Percut Cath  Peritoneal Retroperit  Result Date: 07/20/2016 INDICATION: 83 year old with acute cholecystitis and suspected gallbladder perforation. EXAM: CT-GUIDED PERCUTANEOUS CHOLECYSTOSTOMY TUBE PLACEMENT CT-GUIDED DRAIN PLACEMENT IN RIGHT ABDOMINAL ABSCESS MEDICATIONS: Inpatient and already on antibiotics. ANESTHESIA/SEDATION: Moderate (conscious) sedation was employed during this procedure. A total of Versed 1.0 mg and Fentanyl 37.5 mcg was administered intravenously. Moderate Sedation Time: 41 minutes. The patient's level of consciousness and vital signs were monitored continuously by radiology nursing throughout the procedure under my direct supervision. FLUOROSCOPY TIME:  None COMPLICATIONS: None immediate. PROCEDURE: Informed written consent was obtained from the patient after a thorough discussion of the procedural risks, benefits and alternatives. All questions were addressed. Maximal Sterile Barrier Technique was utilized including caps, mask, sterile gowns, sterile gloves, sterile drape, hand hygiene and skin antiseptic. A timeout was performed prior to the initiation of the procedure. Patient was placed on the  CT scanner with the right side mildly elevated. Images through the right upper abdomen were obtained. The skin was marked in 2 different areas. The skin was prepped with chlorhexidine and a sterile field was created. Skin was anesthetized with 1% lidocaine. 18 gauge needle was directed into the right upper quadrant abscess. Needle position was confirmed within the collection. However, no fluid could be aspirated from this collection. Wire was advanced into the collection. Tract was dilated to accommodate a 10.2 [place] multipurpose drain. Following placement of the drain, large amount of thick brown fluid was aspirated. Approximately 80 mL of thick brown fluid was aspirated from this right upper quadrant collection. Catheter was attached to a suction bulb. Attention was directed to placement of a cholecystostomy tube. Skin was anesthetized with 1% lidocaine. 18 gauge needle was directed into the gallbladder fundus with CT guidance. Thick brown fluid was aspirated from the gallbladder and this is similar to the abscess fluid. Stiff Amplatz wire was placed and the tract was dilated to accommodate a 10.2 [place] multipurpose drain. Approximately 20 mL of thick brown fluid was removed from the gallbladder. Both drains were irrigated with normal saline. Both catheters were sutured to skin. Cholecystostomy tube attached to gravity bag. FINDINGS: Complex air-fluid collection in the right upper quadrant adjacent to the gallbladder. Drain was successfully placed within this collection and 80 mL of thick brown fluid was removed. Cholecystostomy tube was successfully placed within the gallbladder. Evidence for gallstones. IMPRESSION: Successful placement of a CT-guided cholecystostomy tube and CT-guided abscess drain in the right upper quadrant. Findings are compatible with cholecystitis and gallbladder perforation. Recommend flushing both catheters while the patient is in the hospital in order to help facilitate drainage of  this thick brown material. Recommend follow-up CT in approximately 1 week to  evaluate the right upper quadrant abscess collection. Electronically Signed   By: Markus Daft M.D.   On: 07/20/2016 15:46    Anti-infectives: Anti-infectives    Start     Dose/Rate Route Frequency Ordered Stop   07/20/16 1300  meropenem (MERREM) 500 mg in sodium chloride 0.9 % 50 mL IVPB  Status:  Discontinued     500 mg 100 mL/hr over 30 Minutes Intravenous Every 12 hours 07/20/16 1150 07/20/16 1152   07/20/16 1300  meropenem (MERREM) IVPB SOLR 500 mg     500 mg 100 mL/hr over 30 Minutes Intravenous Every 12 hours 07/20/16 1152     07/20/16 0645  aztreonam (AZACTAM) 1 g in dextrose 5 % 50 mL IVPB  Status:  Discontinued     1 g 100 mL/hr over 30 Minutes Intravenous Every 8 hours 07/20/16 0645 07/20/16 1050   07/19/16 1945  vancomycin (VANCOCIN) IVPB 1000 mg/200 mL premix     1,000 mg 200 mL/hr over 60 Minutes Intravenous  Once 07/19/16 1934 07/19/16 2104   07/19/16 1945  meropenem (MERREM) IVPB SOLR 500 mg     500 mg 100 mL/hr over 30 Minutes Intravenous  Once 07/19/16 1934 07/19/16 2116      Assessment/Plan: Improving w medical rx Advance to regular diet May DC in am if ok w medical team  Transition to Po A/Bs cipro and flagyl Renal failure per hospitalist  Caroleen Hamman, MD, FACS  07/22/2016

## 2016-07-22 NOTE — Evaluation (Signed)
Physical Therapy Evaluation Patient Details Name: MILON DETHLOFF MRN: [id number] DOB: 07-06-1927 Today's Date: 07/22/2016   History of Present Illness  Pt admitted for complaints of abdominal pain with N/V; diagnosed with cholecystitis. Pt now s/p percutaneous cholecystomstomy tubes and abscess drainage tube on 1/12. Pt with similar presentation of symptoms last month requiring a hospital admission. Pt with history of CKD, prostate cancer, CHF, shingles, and HTN.  Clinical Impression  Pt is a pleasant 83 year old male who was admitted for acute cholecystitis and is s/p tube placement for drainage. Pt performs bed mobility, transfers, and ambulation with min assist and fatigues easily. Pt with recent hospitalization for similar symptoms. Pt demonstrates deficits with endurance/pain/mobility/balance. Pt currently not at baseline level and is high risk for falls and re-current admission. Would benefit from skilled PT to address above deficits and promote optimal return to PLOF; recommend transition to STR upon discharge from acute hospitalization. Spoke to family/pt regarding recommendation. They prefer HHPT, however are open to SNF.       Follow Up Recommendations SNF    Equipment Recommendations  None recommended by PT    Recommendations for Other Services       Precautions / Restrictions Precautions Precautions: Fall Restrictions Weight Bearing Restrictions: No      Mobility  Bed Mobility Overal bed mobility: Needs Assistance Bed Mobility: Supine to Sit     Supine to sit: Min assist     General bed mobility comments: assist for trunk elevation by pt reaching out for therapist hands. Once seated at EOB, pt able to sit with supervision. Slightly impulsive technique  Transfers Overall transfer level: Needs assistance Equipment used: Rolling walker (2 wheeled) Transfers: Sit to/from Stand Sit to Stand: Min assist         General transfer comment: assist for safe  technique. Needs assist for drainage tubes and hand placement. Once standing, uses RW for assistance  Ambulation/Gait Ambulation/Gait assistance: Min assist Ambulation Distance (Feet): 40 Feet Assistive device: Rolling walker (2 wheeled) Gait Pattern/deviations: Step-through pattern     General Gait Details: ambulated using short step to gait pattern and RW. All mobility performed on RA with pt visably fatigued. Further ambulation refused at this time. Unsteadiness noted with ambulation.  Stairs            Wheelchair Mobility    Modified Rankin (Stroke Patients Only)       Balance Overall balance assessment: Needs assistance Sitting-balance support: Feet supported Sitting balance-Leahy Scale: Good     Standing balance support: Bilateral upper extremity supported Standing balance-Leahy Scale: Fair                               Pertinent Vitals/Pain Pain Assessment: Faces Faces Pain Scale: Hurts little more Pain Location: abdomen Pain Descriptors / Indicators: Dull;Discomfort Pain Intervention(s): Limited activity within patient's tolerance    Home Living Family/patient expects to be discharged to:: Private residence Living Arrangements: Spouse/significant other Available Help at Discharge: Family Type of Home: House Home Access: Ramped entrance     Home Layout: One level Home Equipment: Environmental consultant - 2 wheels;Cane - single point;Bedside commode      Prior Function Level of Independence: Independent with assistive device(s)         Comments: RW used for ambulation in home environment, ambulation function recently declining secondary to pain     Hand Dominance        Extremity/Trunk Assessment  Upper Extremity Assessment Upper Extremity Assessment: Generalized weakness (B UE grossly 3+/5)    Lower Extremity Assessment Lower Extremity Assessment: Generalized weakness (B LE grossly 4/5)       Communication   Communication: HOH   Cognition Arousal/Alertness: Awake/alert Behavior During Therapy: Flat affect Overall Cognitive Status: Within Functional Limits for tasks assessed                      General Comments      Exercises Other Exercises Other Exercises: supine ther-ex x 10 reps including ankle pumps, quad sets, SLRs, and hip abd/add. All ther-ex performed x 10 reps with cga. Safe technique performed   Assessment/Plan    PT Assessment Patient needs continued PT services  PT Problem List Decreased strength;Decreased activity tolerance;Decreased balance;Decreased mobility;Pain          PT Treatment Interventions DME instruction;Gait training;Stair training;Therapeutic exercise    PT Goals (Current goals can be found in the Care Plan section)  Acute Rehab PT Goals Patient Stated Goal: get stronger PT Goal Formulation: With patient Time For Goal Achievement: 08/05/16 Potential to Achieve Goals: Good    Frequency Min 2X/week   Barriers to discharge        Co-evaluation               End of Session Equipment Utilized During Treatment: Gait belt Activity Tolerance: Patient tolerated treatment well Patient left: in bed;with bed alarm set Nurse Communication: Mobility status         Time: 8185-6314 PT Time Calculation (min) (ACUTE ONLY): 17 min   Charges:   PT Evaluation $PT Eval Moderate Complexity: 1 Procedure PT Treatments $Therapeutic Exercise: 8-22 mins   PT G Codes:        Abryana Lykens 08/03/16, 2:54 PM Greggory Stallion, PT, DPT [phone]

## 2016-07-22 NOTE — Progress Notes (Signed)
[facility] [place] NAME: [name]    MR#:  [id number]  DATE OF BIRTH:  [birth year]  SUBJECTIVE:  CHIEF COMPLAINT:   Chief Complaint  Patient presents with  . Hypotension  . Abdominal Pain   - Status post percutaneous cholecystostomy tube and abscess drainage tube. Doing well. - has some cough and congestion - no fevers, minimal abdominal pain. Awaiting cultures  REVIEW OF SYSTEMS:  Review of Systems  Constitutional: Positive for malaise/fatigue. Negative for chills and fever.  HENT: Negative for hearing loss and tinnitus.   Eyes: Negative for blurred vision and double vision.  Respiratory: Positive for cough. Negative for shortness of breath and wheezing.   Cardiovascular: Negative for chest pain, palpitations and leg swelling.  Gastrointestinal: Positive for abdominal pain. Negative for constipation, diarrhea, nausea and vomiting.  Genitourinary: Negative for dysuria.  Neurological: Negative for dizziness, speech change, focal weakness, seizures and headaches.  Psychiatric/Behavioral: Negative for depression.    DRUG ALLERGIES:   Allergies  Allergen Reactions  . Ativan [Lorazepam] Other (See Comments)    Hallucinations, combative  . Penicillin G Other (See Comments)    Has patient had a PCN reaction causing immediate rash, facial/tongue/throat swelling, SOB or lightheadedness with hypotension: Yes Has patient had a PCN reaction causing severe rash involving mucus membranes or skin necrosis: No Has patient had a PCN reaction that required hospitalization No Has patient had a PCN reaction occurring within the last 10 years: No If all of the above answers are "NO", then may proceed with Cephalosporin use.     VITALS:  Blood pressure 112/62, pulse 66, temperature 98.7 F (37.1 C), temperature source Oral, resp. rate 18, height 5\' 7"  (1.702 m), weight 66.6 kg (146 lb 12.8 oz), SpO2 94 %.  PHYSICAL EXAMINATION:  Physical  Exam  GENERAL:  83 y.o.-year-old patient lying in the bed with no acute distress.  EYES: Pupils equal, round, reactive to light and accommodation. No scleral icterus. Extraocular muscles intact.  HEENT: Head atraumatic, normocephalic. Oropharynx and nasopharynx clear.  NECK:  Supple, no jugular venous distention. No thyroid enlargement, no tenderness.  LUNGS: Coarse breath sounds bilaterally, no wheezing or crepitation. No use of accessory muscles of respiration.  CARDIOVASCULAR: S1, S2 normal. No murmurs, rubs, or gallops.  ABDOMEN: Soft, nontender, Only minimal discomfort in left lower quadrant region, nondistended. Bowel sounds present. No organomegaly or mass.  EXTREMITIES: No pedal edema, cyanosis, or clubbing.  NEUROLOGIC: Cranial nerves II through XII are intact. Muscle strength 5/5 in all extremities. Sensation intact. Gait not checked.  PSYCHIATRIC: The patient is alert and oriented x 2-3.  SKIN: No obvious rash, lesion, or ulcer.    LABORATORY PANEL:   CBC  Recent Labs Lab 07/21/16 0550  WBC 7.4  HGB 11.1*  HCT 32.2*  PLT 152   ------------------------------------------------------------------------------------------------------------------  Chemistries   Recent Labs Lab 07/19/16 1602  07/22/16 0603  NA 130*  < > 135  K 4.5  < > 2.9*  CL 96*  < > 102  CO2 22  < > 23  GLUCOSE 121*  < > 97  BUN 56*  < > 44*  CREATININE 3.89*  < > 3.02*  CALCIUM 8.4*  < > 8.0*  AST 24  --   --   ALT 16*  --   --   ALKPHOS 60  --   --   BILITOT 1.1  --   --   < > = values in  this interval not displayed. ------------------------------------------------------------------------------------------------------------------  Cardiac Enzymes  Recent Labs Lab 07/20/16 1825  TROPONINI 0.03*   ------------------------------------------------------------------------------------------------------------------  RADIOLOGY:  Ct Image Guided Drainage By Percutaneous Catheter  Result  Date: 07/20/2016 INDICATION: 83 year old with acute cholecystitis and suspected gallbladder perforation. EXAM: CT-GUIDED PERCUTANEOUS CHOLECYSTOSTOMY TUBE PLACEMENT CT-GUIDED DRAIN PLACEMENT IN RIGHT ABDOMINAL ABSCESS MEDICATIONS: Inpatient and already on antibiotics. ANESTHESIA/SEDATION: Moderate (conscious) sedation was employed during this procedure. A total of Versed 1.0 mg and Fentanyl 37.5 mcg was administered intravenously. Moderate Sedation Time: 41 minutes. The patient's level of consciousness and vital signs were monitored continuously by radiology nursing throughout the procedure under my direct supervision. FLUOROSCOPY TIME:  None COMPLICATIONS: None immediate. PROCEDURE: Informed written consent was obtained from the patient after a thorough discussion of the procedural risks, benefits and alternatives. All questions were addressed. Maximal Sterile Barrier Technique was utilized including caps, mask, sterile gowns, sterile gloves, sterile drape, hand hygiene and skin antiseptic. A timeout was performed prior to the initiation of the procedure. Patient was placed on the CT scanner with the right side mildly elevated. Images through the right upper abdomen were obtained. The skin was marked in 2 different areas. The skin was prepped with chlorhexidine and a sterile field was created. Skin was anesthetized with 1% lidocaine. 18 gauge needle was directed into the right upper quadrant abscess. Needle position was confirmed within the collection. However, no fluid could be aspirated from this collection. Wire was advanced into the collection. Tract was dilated to accommodate a 10.2 [place] multipurpose drain. Following placement of the drain, large amount of thick brown fluid was aspirated. Approximately 80 mL of thick brown fluid was aspirated from this right upper quadrant collection. Catheter was attached to a suction bulb. Attention was directed to placement of a cholecystostomy tube. Skin was anesthetized  with 1% lidocaine. 18 gauge needle was directed into the gallbladder fundus with CT guidance. Thick brown fluid was aspirated from the gallbladder and this is similar to the abscess fluid. Stiff Amplatz wire was placed and the tract was dilated to accommodate a 10.2 [place] multipurpose drain. Approximately 20 mL of thick brown fluid was removed from the gallbladder. Both drains were irrigated with normal saline. Both catheters were sutured to skin. Cholecystostomy tube attached to gravity bag. FINDINGS: Complex air-fluid collection in the right upper quadrant adjacent to the gallbladder. Drain was successfully placed within this collection and 80 mL of thick brown fluid was removed. Cholecystostomy tube was successfully placed within the gallbladder. Evidence for gallstones. IMPRESSION: Successful placement of a CT-guided cholecystostomy tube and CT-guided abscess drain in the right upper quadrant. Findings are compatible with cholecystitis and gallbladder perforation. Recommend flushing both catheters while the patient is in the hospital in order to help facilitate drainage of this thick brown material. Recommend follow-up CT in approximately 1 week to evaluate the right upper quadrant abscess collection. Electronically Signed   By: Markus Daft M.D.   On: 07/20/2016 15:46   Ct Image Guided Drainage Percut Cath  Peritoneal Retroperit  Result Date: 07/20/2016 INDICATION: 83 year old with acute cholecystitis and suspected gallbladder perforation. EXAM: CT-GUIDED PERCUTANEOUS CHOLECYSTOSTOMY TUBE PLACEMENT CT-GUIDED DRAIN PLACEMENT IN RIGHT ABDOMINAL ABSCESS MEDICATIONS: Inpatient and already on antibiotics. ANESTHESIA/SEDATION: Moderate (conscious) sedation was employed during this procedure. A total of Versed 1.0 mg and Fentanyl 37.5 mcg was administered intravenously. Moderate Sedation Time: 41 minutes. The patient's level of consciousness and vital signs were monitored continuously by radiology nursing  throughout the procedure under my direct supervision. FLUOROSCOPY TIME:  None COMPLICATIONS: None immediate. PROCEDURE: Informed written consent was obtained from the patient after a thorough discussion of the procedural risks, benefits and alternatives. All questions were addressed. Maximal Sterile Barrier Technique was utilized including caps, mask, sterile gowns, sterile gloves, sterile drape, hand hygiene and skin antiseptic. A timeout was performed prior to the initiation of the procedure. Patient was placed on the CT scanner with the right side mildly elevated. Images through the right upper abdomen were obtained. The skin was marked in 2 different areas. The skin was prepped with chlorhexidine and a sterile field was created. Skin was anesthetized with 1% lidocaine. 18 gauge needle was directed into the right upper quadrant abscess. Needle position was confirmed within the collection. However, no fluid could be aspirated from this collection. Wire was advanced into the collection. Tract was dilated to accommodate a 10.2 [place] multipurpose drain. Following placement of the drain, large amount of thick brown fluid was aspirated. Approximately 80 mL of thick brown fluid was aspirated from this right upper quadrant collection. Catheter was attached to a suction bulb. Attention was directed to placement of a cholecystostomy tube. Skin was anesthetized with 1% lidocaine. 18 gauge needle was directed into the gallbladder fundus with CT guidance. Thick brown fluid was aspirated from the gallbladder and this is similar to the abscess fluid. Stiff Amplatz wire was placed and the tract was dilated to accommodate a 10.2 [place] multipurpose drain. Approximately 20 mL of thick brown fluid was removed from the gallbladder. Both drains were irrigated with normal saline. Both catheters were sutured to skin. Cholecystostomy tube attached to gravity bag. FINDINGS: Complex air-fluid collection in the right upper quadrant  adjacent to the gallbladder. Drain was successfully placed within this collection and 80 mL of thick brown fluid was removed. Cholecystostomy tube was successfully placed within the gallbladder. Evidence for gallstones. IMPRESSION: Successful placement of a CT-guided cholecystostomy tube and CT-guided abscess drain in the right upper quadrant. Findings are compatible with cholecystitis and gallbladder perforation. Recommend flushing both catheters while the patient is in the hospital in order to help facilitate drainage of this thick brown material. Recommend follow-up CT in approximately 1 week to evaluate the right upper quadrant abscess collection. Electronically Signed   By: Markus Daft M.D.   On: 07/20/2016 15:46    EKG:   Orders placed or performed during the hospital encounter of 07/19/16  . ED EKG  . ED EKG    ASSESSMENT AND PLAN:   83 year old male with past medical history significant for paroxysmal atrial fibrillation not on anticoagulation, diastolic CHF, hypertension, CK D, osteoporosis with recent admission to Hospital last month for same complaints comes back again with abdominal pain nausea and vomiting.  #1 acute gangrenous cholecystitis- Given high risk for open surgery, patient had IR place a cholecystostomy tube and also an abscess drainage tube 07/20/16. -Cultures are growing GNR. Appreciate IR and surgical consults. -Currently on meropenem. Likely has perforated gangrenous cholecystitis. -CT of the abdomen this admission showing findings consistent with possible gangrenous cholecystitis and worse from last admission. - Blood cultures are negative. -On full liquid diet and being advanced as tolerated. Wait until the culture results are back to start by mouth antibiotics  #2 acute renal failure on CK D-avoid nephrotoxic agents. Baseline creatinine seems to be around 2.8. -Creatinine improved.  - continue sodium bicarb  #3 congestive heart failure-recent echo showing  diastolic CHF. Off fluids. Check portable chest x-ray. Continue torsemide orally  #4 hypothyroidism-on Synthroid  #5  Hypokalemia-  on torsemide, so being supplemented.  #6 DVT prophylaxis- teds and SCDs and subcutaneous heparin   Physical therapy consulted.. Anticipate discharge tomorrow   All the records are reviewed and case discussed with Care Management/Social Workerr. Management plans discussed with the patient, family and they are in agreement.  CODE STATUS: Full code  TOTAL TIME TAKING CARE OF THIS PATIENT: 36 minutes.   POSSIBLE D/C TOMORROW, DEPENDING ON CLINICAL CONDITION.   Gladstone Lighter M.D on 07/22/2016 at 9:12 AM  Between 7am to 6pm - Pager - [phone]  After 6pm go to [email or web address] - password [email or web address] [facility]  Office  [phone]  CC: Primary care physician; Vista Mink, FNP

## 2016-07-23 ENCOUNTER — Telehealth: Payer: Self-pay

## 2016-07-23 DIAGNOSIS — K810 Acute cholecystitis: Secondary | ICD-10-CM

## 2016-07-23 LAB — BASIC METABOLIC PANEL
Anion gap: 9 (ref 5–15)
BUN: 40 mg/dL — AB (ref 6–20)
CALCIUM: 8.6 mg/dL — AB (ref 8.9–10.3)
CO2: 25 mmol/L (ref 22–32)
CREATININE: 2.79 mg/dL — AB (ref 0.61–1.24)
Chloride: 103 mmol/L (ref 101–111)
GFR calc Af Amer: 22 mL/min — ABNORMAL LOW (ref >60)
GFR, EST NON AFRICAN AMERICAN: 19 mL/min — AB (ref >60)
Glucose, Bld: 95 mg/dL (ref 65–99)
POTASSIUM: 4.1 mmol/L (ref 3.5–5.1)
SODIUM: 137 mmol/L (ref 135–145)

## 2016-07-23 MED ORDER — BOOST / RESOURCE BREEZE PO LIQD: 1.0000 | Freq: Two times a day (BID) | ORAL | 1 refills | Status: DC

## 2016-07-23 MED ORDER — METRONIDAZOLE 500 MG PO TABS: 500.0000 mg | ORAL_TABLET | Freq: Three times a day (TID) | ORAL | 0 refills | Status: DC

## 2016-07-23 MED ORDER — DOCUSATE SODIUM 100 MG PO CAPS: 100.0000 mg | ORAL_CAPSULE | Freq: Two times a day (BID) | ORAL | 0 refills | Status: DC

## 2016-07-23 MED ORDER — CIPROFLOXACIN HCL 500 MG PO TABS: 500.0000 mg | ORAL_TABLET | Freq: Every day | ORAL | 0 refills | Status: DC

## 2016-07-23 NOTE — Telephone Encounter (Signed)
Notified by Dr. Pat Patrick that patient is in need of follow-up CT Abdomen and Pelvis next week and then follow-up with surgeon. He will also need a T-Tube Cholangiogram in 4-6 weeks. Orders have been placed.  IR scheduling sheet has been faxed to specialty scheduling at this time. Will await call back to schedule.  CT cannot be scheduled until patient has been discharged from hospital per Central Scheduling.  Follow-up appointment has been made for 08/01/16 with Dr. Hampton Abbot at 1045am.

## 2016-07-23 NOTE — Progress Notes (Signed)
Physical Therapy Treatment Patient Details Name: [name] MRN: [id number] DOB: 18-Dec-1926 Today's Date: 07/23/2016    History of Present Illness Pt admitted for complaints of abdominal pain with N/V; diagnosed with cholecystitis. Pt now s/p percutaneous cholecystomstomy tubes and abscess drainage tube on 1/12. Pt with similar presentation of symptoms last month requiring a hospital admission. Pt with history of CKD, prostate cancer, CHF, shingles, and HTN.    PT Comments    Pt in bed, agrees to session.  Discussed with MD prior to session who stated pt would like to return home with wife vs rehab.  Pt was able to get to EOB with rail and without assist.  He stood and was able to ambulate 2 times around nursing unit with 1 seated rest.  Pt denies fatigue but encouraged to rest x 1.   He remained up in chair at end of session.    Pt with improved mobility today and functional independence.      Follow Up Recommendations  Home health PT;Supervision for mobility/OOB     Equipment Recommendations  None recommended by PT    Recommendations for Other Services       Precautions / Restrictions Precautions Precautions: Fall Restrictions Weight Bearing Restrictions: No Other Position/Activity Restrictions: drainage tubes abdomen    Mobility  Bed Mobility Overal bed mobility: Needs Assistance Bed Mobility: Supine to Sit     Supine to sit: Supervision     General bed mobility comments: with rail  Transfers Overall transfer level: Needs assistance Equipment used: Rolling walker (2 wheeled) Transfers: Sit to/from Stand Sit to Stand: Min guard            Ambulation/Gait Ambulation/Gait assistance: Min guard Ambulation Distance (Feet): 160 Feet Assistive device: Rolling walker (2 wheeled) Gait Pattern/deviations: WFL(Within Functional Limits)   Gait velocity interpretation: Below normal speed for age/gender General Gait Details: 160' x 2 around unit with 1 seated  rest.  improved activity tolerance today.  motivated to return home with wife.  generally steady with walker   Stairs            Wheelchair Mobility    Modified Rankin (Stroke Patients Only)       Balance Overall balance assessment: Needs assistance Sitting-balance support: Feet supported Sitting balance-Leahy Scale: Good     Standing balance support: Bilateral upper extremity supported Standing balance-Leahy Scale: Fair                      Cognition Arousal/Alertness: Awake/alert Behavior During Therapy: WFL for tasks assessed/performed Overall Cognitive Status: Within Functional Limits for tasks assessed                      Exercises      General Comments        Pertinent Vitals/Pain Pain Assessment: No/denies pain    Home Living                      Prior Function            PT Goals (current goals can now be found in the care plan section) Acute Rehab PT Goals Patient Stated Goal: to gohome with wife Progress towards PT goals: Progressing toward goals    Frequency    Min 2X/week      PT Plan Current plan remains appropriate    Co-evaluation             End of Session Equipment  Utilized During Treatment: Gait belt Activity Tolerance: Patient tolerated treatment well Patient left: in chair;with call bell/phone within reach;with family/visitor present;with chair alarm set     Time: 9278-0044 PT Time Calculation (min) (ACUTE ONLY): 14 min  Charges:  $Gait Training: 8-22 mins                    G Codes:      Chesley Noon 08/13/16, 11:39 AM

## 2016-07-23 NOTE — Care Management Important Message (Signed)
Important Message  Patient Details  Name: [name] MRN: [id number] Date of Birth: 03-04-1927   Medicare Important Message Given:  Yes    Beverly Sessions, RN 07/23/2016, 12:02 PM

## 2016-07-23 NOTE — Discharge Summary (Signed)
[facility] [place] NAME: [name]    MR#:  [id number]  [place]:  [birth year]  DATE OF ADMISSION:  07/19/2016   ADMITTING PHYSICIAN: Harrie Foreman, MD  DATE OF DISCHARGE: 07/23/2016  3:23 PM  PRIMARY CARE PHYSICIAN: Vista Mink, FNP   ADMISSION DIAGNOSIS:   Acute cholecystitis [K81.0] Sepsis, due to unspecified organism ([place]) [A41.9] Acute renal failure, unspecified acute renal failure type ([place]) [N17.9]  DISCHARGE DIAGNOSIS:   Active Problems:   Cholecystitis   SECONDARY DIAGNOSIS:   Past Medical History:  Diagnosis Date  . Cardiac disease   . CHF (congestive heart failure) ([place])   . Hypercholesteremia   . Hypertension   . Kidney failure    left  . Osteoporosis   . Prostate cancer ([place])   . Shingles   . Skin cancer    right cheek    HOSPITAL COURSE:   83 year old male with past medical history significant for paroxysmal atrial fibrillation not on anticoagulation, diastolic CHF, hypertension, CK D, osteoporosis with recent admission to Hospital last month for same complaints comes back again with abdominal pain nausea and vomiting.  #1 acute gangrenous cholecystitis- Given high risk for open surgery, patient had IR place a cholecystostomy tube and also an abscess drainage tube 07/20/16. -Cultures are growing GNR. Appreciate IR and surgical consults. -was on meropenem. Changed to ciprofloxacin and flagyl for 10 days at discharge. -Likely has perforated gangrenous cholecystitis. -CT of the abdomen this admission showing findings consistent with possible gangrenous cholecystitis and worse from last admission. - Blood cultures are negative. -tolerating soft diet well. Outpatient surgical f/u in 1 week and repeat CT abdomen, drainage has decreased, will be discharged with both drains.  #2 acute renal failure on CK D-avoid nephrotoxic agents. Baseline creatinine seems to be around 2.8. -Creatinine  improved.  - continue sodium bicarb  #3 congestive heart failure-recent echo showing diastolic CHF. portable chest x-ray with mucus plug/atelectasis- patient on room air, breathing improved- outpatient cxr in 2 weeks. Continue torsemide orally  #4 hypothyroidism-on Synthroid  #5  Hypokalemia- on torsemide, so being supplemented.  Worked with physical therapy. They have recommended home health. Will be discharged today.   DISCHARGE CONDITIONS:   Stable  CONSULTS OBTAINED:   Treatment Team:  Florene Glen, MD  DRUG ALLERGIES:   Allergies  Allergen Reactions  . Ativan [Lorazepam] Other (See Comments)    Hallucinations, combative  . Penicillin G Other (See Comments)    Has patient had a PCN reaction causing immediate rash, facial/tongue/throat swelling, SOB or lightheadedness with hypotension: Yes Has patient had a PCN reaction causing severe rash involving mucus membranes or skin necrosis: No Has patient had a PCN reaction that required hospitalization No Has patient had a PCN reaction occurring within the last 10 years: No If all of the above answers are "NO", then may proceed with Cephalosporin use.    DISCHARGE MEDICATIONS:   Allergies as of 07/23/2016      Reactions   Ativan [lorazepam] Other (See Comments)   Hallucinations, combative   Penicillin G Other (See Comments)   Has patient had a PCN reaction causing immediate rash, facial/tongue/throat swelling, SOB or lightheadedness with hypotension: Yes Has patient had a PCN reaction causing severe rash involving mucus membranes or skin necrosis: No Has patient had a PCN reaction that required hospitalization No Has patient had a PCN reaction occurring within the last 10 years: No If all of the above answers  are "NO", then may proceed with Cephalosporin use.      Medication List    TAKE these medications   aspirin EC 81 MG tablet Take 81 mg by mouth at bedtime.   atorvastatin 40 MG tablet Commonly known as:   LIPITOR Take 40 mg by mouth at bedtime.   ciprofloxacin 500 MG tablet Commonly known as:  CIPRO Take 1 tablet (500 mg total) by mouth daily. X 10 days   docusate sodium 100 MG capsule Commonly known as:  COLACE Take 1 capsule (100 mg total) by mouth 2 (two) times daily.   doxazosin 4 MG tablet Commonly known as:  CARDURA Take 4 mg by mouth at bedtime.   feeding supplement Liqd Take 1 Container by mouth 2 (two) times daily between meals.   ferrous sulfate 325 (65 FE) MG tablet Take 325 mg by mouth 2 (two) times daily.   levothyroxine 100 MCG tablet Commonly known as:  SYNTHROID, LEVOTHROID Take 100 mcg by mouth daily.   metoprolol succinate 50 MG 24 hr tablet Commonly known as:  TOPROL-XL Take 50 mg by mouth daily. Take with or immediately following a meal.   metroNIDAZOLE 500 MG tablet Commonly known as:  FLAGYL Take 1 tablet (500 mg total) by mouth every 8 (eight) hours. X 10 days   nitroGLYCERIN 0.4 MG SL tablet Commonly known as:  NITROSTAT Place 0.4 mg under the tongue every 5 (five) minutes as needed for chest pain.   omega-3 acid ethyl esters 1 g capsule Commonly known as:  LOVAZA Take 1 capsule by mouth 2 (two) times daily.   pantoprazole 40 MG tablet Commonly known as:  PROTONIX Take 40 mg by mouth 2 (two) times daily.   potassium chloride SA 20 MEQ tablet Commonly known as:  K-DUR,KLOR-CON Take 20 mEq by mouth 2 (two) times daily.   sodium bicarbonate 650 MG tablet Take 1,300 mg by mouth 2 (two) times daily.   tamsulosin 0.4 MG Caps capsule Commonly known as:  FLOMAX Take 0.4 mg by mouth daily.   torsemide 20 MG tablet Commonly known as:  DEMADEX Take 20 mg by mouth 2 (two) times daily.   Vitamin D3 5000 units Tabs Take 5,000 Units by mouth daily.        DISCHARGE INSTRUCTIONS:   1. PCP f/u in 1-2 weeks 2. Surgical f/u in 1 week and CT abdomen at that time and outpatient cholangiogram in 6 weeks  DIET:   Cardiac diet  ACTIVITY:    Activity as tolerated  OXYGEN:   Home Oxygen: No.  Oxygen Delivery: room air  DISCHARGE LOCATION:   home   If you experience worsening of your admission symptoms, develop shortness of breath, life threatening emergency, suicidal or homicidal thoughts you must seek medical attention immediately by calling 911 or calling your MD immediately  if symptoms less severe.  You Must read complete instructions/literature along with all the possible adverse reactions/side effects for all the Medicines you take and that have been prescribed to you. Take any new Medicines after you have completely understood and accpet all the possible adverse reactions/side effects.   Please note  You were cared for by a hospitalist during your hospital stay. If you have any questions about your discharge medications or the care you received while you were in the hospital after you are discharged, you can call the unit and asked to speak with the hospitalist on call if the hospitalist that took care of you is not available. Once  you are discharged, your primary care physician will handle any further medical issues. Please note that NO REFILLS for any discharge medications will be authorized once you are discharged, as it is imperative that you return to your primary care physician (or establish a relationship with a primary care physician if you do not have one) for your aftercare needs so that they can reassess your need for medications and monitor your lab values.    On the day of Discharge:  VITAL SIGNS:   Blood pressure (!) 114/58, pulse 66, temperature 97.7 F (36.5 C), temperature source Oral, resp. rate 20, height 5\' 7"  (1.702 m), weight 66.6 kg (146 lb 12.8 oz), SpO2 97 %.  PHYSICAL EXAMINATION:    GENERAL:  83 y.o.-year-old patient lying in the bed with no acute distress.  EYES: Pupils equal, round, reactive to light and accommodation. No scleral icterus. Extraocular muscles intact.  HEENT: Head  atraumatic, normocephalic. Oropharynx and nasopharynx clear.  NECK:  Supple, no jugular venous distention. No thyroid enlargement, no tenderness.  LUNGS: Coarse breath sounds bilaterally, no wheezing or crepitation. No use of accessory muscles of respiration.  CARDIOVASCULAR: S1, S2 normal. No murmurs, rubs, or gallops.  ABDOMEN: Soft, nontender, Only minimal discomfort in left lower quadrant region, nondistended. Bowel sounds present. No organomegaly or mass.  EXTREMITIES: No pedal edema, cyanosis, or clubbing.  NEUROLOGIC: Cranial nerves II through XII are intact. Muscle strength 5/5 in all extremities. Sensation intact. Gait not checked.  PSYCHIATRIC: The patient is alert and oriented x 3.  SKIN: No obvious rash, lesion, or ulcer.    DATA REVIEW:   CBC  Recent Labs Lab 07/21/16 0550  WBC 7.4  HGB 11.1*  HCT 32.2*  PLT 152    Chemistries   Recent Labs Lab 07/19/16 1602  07/23/16 0507  NA 130*  < > 137  K 4.5  < > 4.1  CL 96*  < > 103  CO2 22  < > 25  GLUCOSE 121*  < > 95  BUN 56*  < > 40*  CREATININE 3.89*  < > 2.79*  CALCIUM 8.4*  < > 8.6*  AST 24  --   --   ALT 16*  --   --   ALKPHOS 60  --   --   BILITOT 1.1  --   --   < > = values in this interval not displayed.   Microbiology Results  Results for orders placed or performed during the hospital encounter of 07/19/16  Urine culture     Status: Abnormal   Collection Time: 07/19/16  4:02 PM  Result Value Ref Range Status   Specimen Description URINE, RANDOM  Final   Special Requests NONE  Final   Culture MULTIPLE SPECIES PRESENT, SUGGEST RECOLLECTION (A)  Final   Report Status 07/21/2016 FINAL  Final  Culture, blood (routine x 2)     Status: None (Preliminary result)   Collection Time: 07/19/16  7:55 PM  Result Value Ref Range Status   Specimen Description BLOOD LEFT AC  Final   Special Requests   Final    BOTTLES DRAWN AEROBIC AND ANAEROBIC AER 74ML ANA 74M,L   Culture NO GROWTH 4 DAYS  Final   Report  Status PENDING  Incomplete  Culture, blood (routine x 2)     Status: None (Preliminary result)   Collection Time: 07/19/16  7:55 PM  Result Value Ref Range Status   Specimen Description BLOOD RIGHT ARM  Final   Special Requests  Final    BOTTLES DRAWN AEROBIC AND ANAEROBIC AER 8ML ANA 4ML   Culture NO GROWTH 4 DAYS  Final   Report Status PENDING  Incomplete  Aerobic/Anaerobic Culture (surgical/deep wound)     Status: None (Preliminary result)   Collection Time: 07/20/16  1:00 PM  Result Value Ref Range Status   Specimen Description ABSCESS  Final   Special Requests NONE  Final   Gram Stain   Final    MODERATE WBC PRESENT, PREDOMINANTLY PMN MODERATE GRAM NEGATIVE RODS FEW GRAM POSITIVE COCCI IN PAIRS Performed at [facility]    Culture   Final    ABUNDANT GRAM NEGATIVE RODS REPEATING ID AND SENSITIVITIES NO ANAEROBES ISOLATED; CULTURE IN PROGRESS FOR 5 DAYS    Report Status PENDING  Incomplete    RADIOLOGY:  No results found.   Management plans discussed with the patient, family and they are in agreement.  CODE STATUS:     Code Status Orders        Start     Ordered   07/20/16 0615  Full code  Continuous     07/20/16 0614    Code Status History    Date Active Date Inactive Code Status Order ID Comments User Context   06/25/2016  6:15 AM 06/30/2016  4:17 PM Full Code [id number]  Harrie Foreman, MD Inpatient   07/20/2015 12:00 PM 07/23/2015  3:48 PM Full Code [id number]  Epifanio Lesches, MD ED   06/03/2015  3:17 PM 06/07/2015 12:18 PM Full Code [id number]  Bettey Costa, MD Inpatient      TOTAL TIME TAKING CARE OF THIS PATIENT: 37 minutes.    Gladstone Lighter M.D on 07/23/2016 at 5:19 PM  Between 7am to 6pm - Pager - [phone]  After 6pm go to [email or web address] - Proofreader  [facility]  Office  [phone]  CC: Primary care physician; Vista Mink, FNP   Note: This dictation was prepared with  Dragon dictation along with smaller phrase technology. Any transcriptional errors that result from this process are unintentional.

## 2016-07-23 NOTE — Clinical Social Work Note (Signed)
CSW received referral for SNF.  Case discussed with case manager and plan is to discharge home with home health.  CSW to sign off please re-consult if social work needs arise.  Jones Broom. [place], MSW, [place]

## 2016-07-23 NOTE — Progress Notes (Signed)
Pt d/c to home today.  IV removed intact.  Rx's given to pt w/all questions and concerns addressed.  D/C paperwork reviewed and education provided with all questions and concerns addressed.  Pt being d/c with two drains. Pt given Intake/Output  Paper to chart drainage.  Dressing changed and CDI.  Pt wife at bedside for home transport.

## 2016-07-23 NOTE — Care Management (Signed)
Patient to discharge and return home today.  [name] with [facility] notified of discharge.  SW added to discharge orders.  RNCM signing off.

## 2016-07-23 NOTE — Progress Notes (Signed)
Spoke to Dr. Pat Patrick per Dr. Lucie Leather request (Interventional Radiologist)-recommend a repeat CT scan in 7-10 days and a cholecystostogram in 6 weeks.

## 2016-07-23 NOTE — Progress Notes (Signed)
The radiology service called this morning to discuss interventional radiology follow-up. They would like to consider a repeat CT scan 1 week from discharge and we can arrange that through our office.  We will also like a cholangiogram in approximately 4-6 weeks. We will also make an effort to arrange that through our office.

## 2016-07-23 NOTE — Progress Notes (Signed)
Subjective:   He is alert oriented and appears to be feeling better. He denies any significant pain. He is not nauseated has been eating some regular diet. His laboratory values are reviewed. His drainage is appropriate.  Vital signs in last 24 hours: Temp:  [97.9 F (36.6 C)] 97.9 F (36.6 C) (01/15 0600) Pulse Rate:  [52] 52 (01/15 0600) BP: (132)/(61) 132/61 (01/15 0600) SpO2:  [96 %] 96 % (01/15 0600) Last BM Date: 07/22/16  Intake/Output from previous day: 01/14 0701 - 01/15 0700 In: 20 [P.O.:660] Out: 1520 [Urine:1275; Drains:245]  Exam:  His drainage catheters are intact with normally appearing fluid. There is no sign of any surrounding infection. Dressings were reapplied. His abdomen is otherwise benign.  Lab Results:  CBC  Recent Labs  07/21/16 0550  WBC 7.4  HGB 11.1*  HCT 32.2*  PLT 152   CMP     Component Value Date/Time   NA 137 07/23/2016 0507   NA 142 03/07/2012 0520   K 4.1 07/23/2016 0507   K 3.4 (L) 03/07/2012 1551   CL 103 07/23/2016 0507   CL 105 03/07/2012 0520   CO2 25 07/23/2016 0507   CO2 27 03/07/2012 0520   GLUCOSE 95 07/23/2016 0507   GLUCOSE 128 (H) 03/07/2012 0520   BUN 40 (H) 07/23/2016 0507   BUN 14 03/07/2012 0520   CREATININE 2.79 (H) 07/23/2016 0507   CREATININE 1.81 (H) 03/07/2012 0520   CALCIUM 8.6 (L) 07/23/2016 0507   CALCIUM 8.2 (L) 03/07/2012 0520   PROT 6.7 07/19/2016 1602   PROT 7.0 03/05/2012 1142   ALBUMIN 3.1 (L) 07/19/2016 1602   ALBUMIN 3.5 03/05/2012 1142   AST 24 07/19/2016 1602   AST 33 03/05/2012 1142   ALT 16 (L) 07/19/2016 1602   ALT 30 03/05/2012 1142   ALKPHOS 60 07/19/2016 1602   ALKPHOS 75 03/05/2012 1142   BILITOT 1.1 07/19/2016 1602   BILITOT 1.0 03/05/2012 1142   GFRNONAA 19 (L) 07/23/2016 0507   GFRNONAA 34 (L) 03/07/2012 0520   GFRAA 22 (L) 07/23/2016 0507   GFRAA 39 (L) 03/07/2012 0520   PT/INR  Recent Labs  07/20/16 1052  LABPROT 14.3  INR 1.11    Studies/Results: Dg Chest  Port 1 View  Result Date: 07/22/2016 CLINICAL DATA:  Chest congestion and coughing.  Ex-smoker. EXAM: PORTABLE CHEST 1 VIEW COMPARISON:  07/19/2016.  Chest CT dated 06/25/2016. FINDINGS: Previously demonstrated large hiatal hernia. Interval opacification of the left hemithorax with some residual aerated lung. Stable prominence of the interstitial markings on the right. No gross change in enlargement of the cardiac silhouette, currently shifted to the left. There is also tracheal deviation to the left. Diffuse osteopenia. Right upper abdominal pigtail catheter. IMPRESSION: 1. Interval diffuse left lung volume loss and possible pleural fluid. This suggests a centrally obstructing mass or mucous plug. 2. Previously demonstrated large hiatal hernia. 3. Mild chronic interstitial lung disease. Electronically Signed   By: Claudie Revering M.D.   On: 07/22/2016 11:14    Assessment/Plan: From a surgical standpoint he is certainly safe to discharge at this time. I biggest concern is care for his drainage tubes and his continued physical therapy. He would likely benefit from a skilled nursing admission for rehabilitation or at a minimum home health service with PT and nursing follow-up. We will be available to see him as necessary and we'll plan to follow him in the office.

## 2016-07-24 LAB — CULTURE, BLOOD (ROUTINE X 2)
CULTURE: NO GROWTH
CULTURE: NO GROWTH

## 2016-07-25 LAB — AEROBIC/ANAEROBIC CULTURE W GRAM STAIN (SURGICAL/DEEP WOUND)

## 2016-07-25 NOTE — Telephone Encounter (Signed)
Call made to Central Scheduling at this time. They are not working due to inclement weather. Will schedule CT tommorow.  Call also made to Speciality Scheduling in regards to T-Tube Cholangiogram. This will need to be moved to the week of 2/19 to be 5 weeks from date of discharge as per my original request. They are not working today either due to weather but I am expecting a call back in regards to this.

## 2016-07-26 NOTE — Telephone Encounter (Signed)
Spoke with Cecille Rubin in specials recovery to cancel this T-Tube Cholangiogram for tomorrow. Explained that this will be rescheduled for February. Patient was not notified of scheduled test so he will not need to be called. Once this has been rescheduled by specialty scheduling, I will notify patient.  Will call specialty scheduling once again tomorrow to get this rescheduled.

## 2016-07-27 ENCOUNTER — Ambulatory Visit: Admission: RE | Admit: 2016-07-27 | Payer: Medicare Other | Source: Ambulatory Visit

## 2016-07-27 NOTE — Progress Notes (Signed)
83 y/o M with gangrenous cholecystitis s/p cholecystostomy tube and abscess drainange d/c 1/15 on Cipro and Flagyl. Abscess cx resulted with E coli resistant to cipro and E faecalis. Spoke to Dr. Tressia Miners who will prescribe cefpodoxime and continue ciprofloxacin and Flagyl as recommended by ID physician. Confirmed E faecalis sensitivity to ciprofloxacin with MIC of 1 mcg/ml per lab.   Ulice Dash, PharmD Clinical Pharmacist

## 2016-07-30 ENCOUNTER — Ambulatory Visit: Payer: Self-pay | Admitting: Surgery

## 2016-07-30 NOTE — Telephone Encounter (Signed)
Spoke with patient's wife at this time. All instructions have been reviewed. All appointment information given including location.

## 2016-07-30 NOTE — Telephone Encounter (Signed)
Call made to central scheduling. CT Scan scheduled for [facility], tomorrow 07/31/16 at 0930am. Patient is not to have anything to eat or drink 4 hours prior to scan.  Patient will then come to [facility] office after scan has been completed and will follow-up with Dr. Hampton Abbot at 1330.  T-tube Cholangiogram was rescheduled to 08/27/16. This information regarding this testing will be given to patient at his appointment tomorrow.  Call made to patient at this time. No answer at home number. Unable to leave message- phone continued to ring.  Call made to patient's wife, [name] at this time. She asked that I call her back at 1130am today to give her the information about patient's appointments.

## 2016-07-31 ENCOUNTER — Ambulatory Visit
Admission: RE | Admit: 2016-07-31 | Discharge: 2016-07-31 | Disposition: A | Discharge status: Home / Self Care | Payer: Medicare Other | Source: Ambulatory Visit | Attending: Surgery | Admitting: Surgery

## 2016-07-31 ENCOUNTER — Encounter: Payer: Self-pay | Admitting: Surgery

## 2016-07-31 ENCOUNTER — Other Ambulatory Visit: Payer: Self-pay

## 2016-07-31 ENCOUNTER — Ambulatory Visit (INDEPENDENT_AMBULATORY_CARE_PROVIDER_SITE_OTHER): Payer: Medicare Other | Admitting: Surgery

## 2016-07-31 VITALS — BP 111/70 | HR 79 | Temp 98.1°F | Ht 67.0 in | Wt 142.8 lb

## 2016-07-31 DIAGNOSIS — G319 Degenerative disease of nervous system, unspecified: Secondary | ICD-10-CM | POA: Diagnosis not present

## 2016-07-31 DIAGNOSIS — Z9049 Acquired absence of other specified parts of digestive tract: Secondary | ICD-10-CM | POA: Insufficient documentation

## 2016-07-31 DIAGNOSIS — J439 Emphysema, unspecified: Secondary | ICD-10-CM | POA: Diagnosis not present

## 2016-07-31 DIAGNOSIS — N132 Hydronephrosis with renal and ureteral calculous obstruction: Secondary | ICD-10-CM | POA: Insufficient documentation

## 2016-07-31 DIAGNOSIS — K810 Acute cholecystitis: Secondary | ICD-10-CM

## 2016-07-31 DIAGNOSIS — K5730 Diverticulosis of large intestine without perforation or abscess without bleeding: Secondary | ICD-10-CM | POA: Insufficient documentation

## 2016-07-31 DIAGNOSIS — I700 Atherosclerosis of aorta: Secondary | ICD-10-CM | POA: Diagnosis not present

## 2016-07-31 NOTE — Patient Instructions (Signed)
Please continue your care and drainage of both of your drains just as you have been. You are doing a great job.  Your job at this time is to eat as many calories and as much protein as possible to help you get stronger and heal quickly.  You will follow up in 1 week as scheduled below. Please call Endy Easterly (Nurse) with any questions or concerns that you have before this appointment.   High-Protein and High-Calorie Diet Introduction Eating high-protein and high-calorie foods can help you to gain weight, heal after an injury, and recover after an illness or surgery. What is my plan? The specific amount of daily protein and calories you need depends on:  Your body weight.  The reason this diet is recommended for you. Generally, a high-protein, high-calorie diet involves:  Eating 250-500 extra calories each day.  Making sure that 10-35% of your daily calories come from protein. Talk to your health care provider about how much protein and how many calories you need each day. Follow the diet as directed by your health care provider. What do I need to know about this diet?  Ask your health care provider if you should take a nutritional supplement.  Try to eat six small meals each day instead of three large meals.  Eat a balanced diet, including one food that is high in protein at each meal.  Keep nutritious snacks handy, such as nuts, trail mixes, dried fruit, and yogurt.  If you have kidney disease or diabetes, eating too much protein may put extra stress on your kidneys. Talk to your health care provider if you have either of those conditions. What are some high-protein foods? Grains  Quinoa. Bulgur wheat. Vegetables  Soybeans. Peas. Meats and Other Protein Sources  Beef, pork, and poultry. Fish and seafood. Eggs. Tofu. Textured vegetable protein (TVP). Peanut butter. Nuts and seeds. Dried beans. Protein powders. Dairy  Whole milk. Whole-milk yogurt. Powdered milk. Cheese. [employer]. Eggnog. Beverages  High-protein supplement drinks. Soy milk. Other  Protein bars. The items listed above may not be a complete list of recommended foods or beverages. Contact your dietitian for more options.  What are some high-calorie foods? Grains  Pasta. Quick breads. Muffins. Pancakes. Ready-to-eat cereal. Vegetables  Vegetables cooked in oil or butter. Fried potatoes. Fruits  Dried fruit. Fruit leather. Canned fruit in syrup. Fruit juice. Avocados. Meats and Other Protein Sources  Peanut butter. Nuts and seeds. Dairy  Heavy cream. Whipped cream. Cream cheese. Sour cream. Ice cream. Custard. Pudding. Beverages  Meal-replacement beverages. Nutrition shakes. Fruit juice. Sugar-sweetened soft drinks. Condiments  Salad dressing. Mayonnaise. Alfredo sauce. Fruit preserves or jelly. Honey. Syrup. Sweets/Desserts  Cake. Cookies. Pie. Pastries. Candy bars. Chocolate. Fats and Oils  Butter or margarine. Oil. Gravy. Other  Meal-replacement bars. The items listed above may not be a complete list of recommended foods or beverages. Contact your dietitian for more options.  What are some tips for including high-protein and high-calorie foods in my diet?  Add whole milk, half-and-half, or heavy cream to cereal, pudding, soup, or hot cocoa.  Add whole milk to instant breakfast drinks.  Add peanut butter to oatmeal or smoothies.  Add powdered milk to baked goods, smoothies, or milkshakes.  Add powdered milk, cream, or butter to mashed potatoes.  Add cheese to cooked vegetables.  Make whole-milk yogurt parfaits. Top them with granola, fruit, or nuts.  Add cottage cheese to your fruit.  Add avocados, cheese, or both to sandwiches or salads.  Add meat, poultry, or seafood to rice, pasta, casseroles, salads, and soups.  Use mayonnaise when making egg salad, chicken salad, or tuna salad.  Use peanut butter as a topping for pretzels, celery, or crackers.  Add beans to  casseroles, dips, and spreads.  Add pureed beans to sauces and soups.  Replace calorie-free drinks with calorie-containing drinks, such as milk and fruit juice. This information is not intended to replace advice given to you by your health care provider. Make sure you discuss any questions you have with your health care provider. Document Released: 06/25/2005 Document Revised: 12/01/2015 Document Reviewed: 12/08/2013  2017 [employer]

## 2016-07-31 NOTE — Progress Notes (Signed)
07/31/2016  HPI: Patient was admitted on 1/12 with acute cholecystitis with a pericholecystic abscess. He underwent drainage with interventional radiology and had a percutaneous drainage catheter for the abscess as well as a percutaneous cholecystostomy tube placed on 1/13. He was discharged on 1/15 with antibiotic course and both drains in place. He has CT scan of the abdomen pelvis with oral contrast only due to chronic renal disease today and presents for further evaluation for possible drainage removal.  Patient reports that since his discharge he is remained somewhat weak requires a lot of assistance for ambulation. Denies any fevers or chills. Has been able to eat although his appetite is decreased. Otherwise no nausea or vomiting. His wife has been keeping up with the volume coming from both drains and has not had any issues with either one.  Vital signs: BP 111/70   Pulse 79   Temp 98.1 F (36.7 C) (Oral)   Ht 5\' 7"  (1.702 m)   Wt 64.8 kg (142 lb 12.8 oz)   BMI 22.37 kg/m    Physical Exam: Constitutional: No acute distress Abdomen: Soft, nondistended, with mild discomfort to palpation over the drain insertion sites. The abscess drain has still purulent brown fluid in the bulb. The cholecystostomy tube was bile in the bag.  Imaging: CT scan reviewed showing a decompressed gallbladder with the cholecystostomy tube in place with mild surrounding inflammatory changes. There is no definite per cholecystic fluid collection or abscess although there is some stranding around the drainage catheter.  Assessment/Plan: 83 year old male with acute cholecystitis and pericholecystic abscess status post IR drainage.  -The abscess drain is draining low volumes per day of approximately 10 mL. However the appearance of the fluid is still murky and purulent and does will keep the tube in place today. CT scan of the abdomen and pelvis done did not reveal any significant fluid collection but given the  nature of the fluid will keep the drain in place for now. He will follow-up next week for further elevation of the drain and the characteristic of the fluid for possible removal of drain. I discussed with the patient that of the drain still appears to be purulent or murky that we may require to do an abscess study through the drain catheter to evaluate further. -Continue percutaneous cholecystostomy tube continues to work well. He scheduled to undergo a cholangiogram on 2/19. Discussed with the patient and his wife that after the cholangiogram is done we would make a decision of whether the tube could be removed versus discuss further potential for surgery. At this point however the point is very weak and would need to be stronger and better nourished and cleared by his cardiologist and PCP for surgery. The patient understands this plan and all of their questions have been answered. -Patient is to continue his antibiotics until completed courses.   Melvyn Neth, [place]

## 2016-08-01 ENCOUNTER — Ambulatory Visit: Payer: Self-pay | Admitting: Surgery

## 2016-08-07 ENCOUNTER — Ambulatory Visit (INDEPENDENT_AMBULATORY_CARE_PROVIDER_SITE_OTHER): Payer: Medicare Other | Admitting: Surgery

## 2016-08-07 ENCOUNTER — Ambulatory Visit: Payer: Medicare Other | Admitting: Surgery

## 2016-08-07 ENCOUNTER — Ambulatory Visit: Payer: Self-pay | Admitting: Surgery

## 2016-08-07 ENCOUNTER — Encounter: Payer: Self-pay | Admitting: Surgery

## 2016-08-07 VITALS — BP 107/72 | HR 84 | Temp 98.7°F | Ht 67.0 in | Wt 142.0 lb

## 2016-08-07 DIAGNOSIS — K810 Acute cholecystitis: Secondary | ICD-10-CM | POA: Diagnosis not present

## 2016-08-07 NOTE — Progress Notes (Signed)
83 yo dilatated male status post cholecystostomy tube and also percutaneous drain placement French abdominal abscess. Because cystostomy tube working very well with about 200 cc a day. The other into abdominal drain still in without purulent material and in the order of 20 cc a day. He did have a recent CT scan that I have personally reviewed and there is no evidence of any further collections. However the collection from the percutaneous drain is in close proximity to the colon might be a potential site of  colocutaneous fistula. He is tolerating by mouth and given a little bit more weight. The nausea and vomiting no fevers. He completed his antibiotic therapy   PE dilatated elderly male in no acute distress Abd: soft, NT, O cystostomy tube in place. There is the percutaneous tube with some purulent material. No peritonitis. No tenderness Ext: well perfused, no edema  A/p patient with abdominal abscess and percutaneous drain placement as well as cholecystitis status post cholecystostomy tube. For now we will keep tubes since there is a potential for a colocutaneous fistula on the purulent drain next to the colon. At this point he is nontoxic and does not need any antibiotics since he is not showing signs of infection. We'll see him back in 2 weeks application removed the percutaneous drain. I am more likely he will keep his cholecystostomy tube until the end of his days no need for any surgical revision at this time

## 2016-08-07 NOTE — Patient Instructions (Addendum)
Continue eating as many calories and protein as you can. This will give you strength and help you heal.  Continue to keep up with the drain output on your new records given to you today.  We will see you back in 2 weeks as scheduled below.  If you have any questions or concerns, please call and speak with my nurse Ashlyne Olenick.    High-Protein and High-Calorie Diet Introduction Eating high-protein and high-calorie foods can help you to gain weight, heal after an injury, and recover after an illness or surgery. What is my plan? The specific amount of daily protein and calories you need depends on:  Your body weight.  The reason this diet is recommended for you. Generally, a high-protein, high-calorie diet involves:  Eating 250-500 extra calories each day.  Making sure that 10-35% of your daily calories come from protein. Talk to your health care provider about how much protein and how many calories you need each day. Follow the diet as directed by your health care provider. What do I need to know about this diet?  Ask your health care provider if you should take a nutritional supplement.  Try to eat six small meals each day instead of three large meals.  Eat a balanced diet, including one food that is high in protein at each meal.  Keep nutritious snacks handy, such as nuts, trail mixes, dried fruit, and yogurt.  If you have kidney disease or diabetes, eating too much protein may put extra stress on your kidneys. Talk to your health care provider if you have either of those conditions. What are some high-protein foods? Grains  Quinoa. Bulgur wheat. Vegetables  Soybeans. Peas. Meats and Other Protein Sources  Beef, pork, and poultry. Fish and seafood. Eggs. Tofu. Textured vegetable protein (TVP). Peanut butter. Nuts and seeds. Dried beans. Protein powders. Dairy  Whole milk. Whole-milk yogurt. Powdered milk. Cheese. [employer]. Eggnog. Beverages  High-protein supplement  drinks. Soy milk. Other  Protein bars. The items listed above may not be a complete list of recommended foods or beverages. Contact your dietitian for more options.  What are some high-calorie foods? Grains  Pasta. Quick breads. Muffins. Pancakes. Ready-to-eat cereal. Vegetables  Vegetables cooked in oil or butter. Fried potatoes. Fruits  Dried fruit. Fruit leather. Canned fruit in syrup. Fruit juice. Avocados. Meats and Other Protein Sources  Peanut butter. Nuts and seeds. Dairy  Heavy cream. Whipped cream. Cream cheese. Sour cream. Ice cream. Custard. Pudding. Beverages  Meal-replacement beverages. Nutrition shakes. Fruit juice. Sugar-sweetened soft drinks. Condiments  Salad dressing. Mayonnaise. Alfredo sauce. Fruit preserves or jelly. Honey. Syrup. Sweets/Desserts  Cake. Cookies. Pie. Pastries. Candy bars. Chocolate. Fats and Oils  Butter or margarine. Oil. Gravy. Other  Meal-replacement bars. The items listed above may not be a complete list of recommended foods or beverages. Contact your dietitian for more options.  What are some tips for including high-protein and high-calorie foods in my diet?  Add whole milk, half-and-half, or heavy cream to cereal, pudding, soup, or hot cocoa.  Add whole milk to instant breakfast drinks.  Add peanut butter to oatmeal or smoothies.  Add powdered milk to baked goods, smoothies, or milkshakes.  Add powdered milk, cream, or butter to mashed potatoes.  Add cheese to cooked vegetables.  Make whole-milk yogurt parfaits. Top them with granola, fruit, or nuts.  Add cottage cheese to your fruit.  Add avocados, cheese, or both to sandwiches or salads.  Add meat, poultry, or seafood to rice, pasta,  casseroles, salads, and soups.  Use mayonnaise when making egg salad, chicken salad, or tuna salad.  Use peanut butter as a topping for pretzels, celery, or crackers.  Add beans to casseroles, dips, and spreads.  Add pureed beans to  sauces and soups.  Replace calorie-free drinks with calorie-containing drinks, such as milk and fruit juice. This information is not intended to replace advice given to you by your health care provider. Make sure you discuss any questions you have with your health care provider. Document Released: 06/25/2005 Document Revised: 12/01/2015 Document Reviewed: 12/08/2013  2017 Elsevier

## 2016-08-09 ENCOUNTER — Telehealth: Payer: Self-pay

## 2016-08-09 NOTE — Telephone Encounter (Signed)
I called [name] and updated both [name] and [name] on the change of appointment date. I told them that [name] needs to go and get Kirk labs in before the appointment. Both [name] and [name] understood. I am going to send [name] a reminder of Kirk appointment through the mail.

## 2016-08-19 ENCOUNTER — Emergency Department
Admission: EM | Admit: 2016-08-19 | Discharge: 2016-08-19 | Disposition: A | Discharge status: Home / Self Care | Payer: Medicare Other | Attending: Emergency Medicine | Admitting: Emergency Medicine

## 2016-08-19 DIAGNOSIS — Y828 Other medical devices associated with adverse incidents: Secondary | ICD-10-CM | POA: Diagnosis not present

## 2016-08-19 DIAGNOSIS — E860 Dehydration: Secondary | ICD-10-CM | POA: Diagnosis not present

## 2016-08-19 DIAGNOSIS — T85898A Other specified complication of other internal prosthetic devices, implants and grafts, initial encounter: Secondary | ICD-10-CM | POA: Diagnosis present

## 2016-08-19 DIAGNOSIS — I509 Heart failure, unspecified: Secondary | ICD-10-CM | POA: Diagnosis not present

## 2016-08-19 DIAGNOSIS — Z7982 Long term (current) use of aspirin: Secondary | ICD-10-CM | POA: Insufficient documentation

## 2016-08-19 DIAGNOSIS — N179 Acute kidney failure, unspecified: Secondary | ICD-10-CM

## 2016-08-19 DIAGNOSIS — Z79899 Other long term (current) drug therapy: Secondary | ICD-10-CM | POA: Diagnosis not present

## 2016-08-19 DIAGNOSIS — F17220 Nicotine dependence, chewing tobacco, uncomplicated: Secondary | ICD-10-CM | POA: Diagnosis not present

## 2016-08-19 DIAGNOSIS — I110 Hypertensive heart disease with heart failure: Secondary | ICD-10-CM | POA: Diagnosis not present

## 2016-08-19 LAB — URINALYSIS, COMPLETE (UACMP) WITH MICROSCOPIC
BILIRUBIN URINE: NEGATIVE
Bacteria, UA: NONE SEEN
Glucose, UA: 50 mg/dL — AB
Ketones, ur: NEGATIVE mg/dL
NITRITE: NEGATIVE
PH: 7 (ref 5.0–8.0)
Protein, ur: NEGATIVE mg/dL
SPECIFIC GRAVITY, URINE: 1.012 (ref 1.005–1.030)

## 2016-08-19 LAB — CBC WITH DIFFERENTIAL/PLATELET
BASOS ABS: 0 10*3/uL (ref 0–0.1)
BASOS PCT: 1 %
EOS ABS: 0.1 10*3/uL (ref 0–0.7)
EOS PCT: 3 %
HCT: 33.6 % — ABNORMAL LOW (ref 40.0–52.0)
Hemoglobin: 11.6 g/dL — ABNORMAL LOW (ref 13.0–18.0)
Lymphocytes Relative: 20 %
Lymphs Abs: 0.7 10*3/uL — ABNORMAL LOW (ref 1.0–3.6)
MCH: 29.7 pg (ref 26.0–34.0)
MCHC: 34.5 g/dL (ref 32.0–36.0)
MCV: 86 fL (ref 80.0–100.0)
MONO ABS: 0.3 10*3/uL (ref 0.2–1.0)
Monocytes Relative: 9 %
NEUTROS ABS: 2.4 10*3/uL (ref 1.4–6.5)
Neutrophils Relative %: 67 %
PLATELETS: 74 10*3/uL — AB (ref 150–440)
RBC: 3.91 MIL/uL — ABNORMAL LOW (ref 4.40–5.90)
RDW: 15.6 % — AB (ref 11.5–14.5)
WBC: 3.5 10*3/uL — ABNORMAL LOW (ref 3.8–10.6)

## 2016-08-19 LAB — COMPREHENSIVE METABOLIC PANEL
ALBUMIN: 3 g/dL — AB (ref 3.5–5.0)
ALK PHOS: 38 U/L (ref 38–126)
ALT: 11 U/L — AB (ref 17–63)
ANION GAP: 10 (ref 5–15)
AST: 19 U/L (ref 15–41)
BILIRUBIN TOTAL: 0.7 mg/dL (ref 0.3–1.2)
BUN: 47 mg/dL — AB (ref 6–20)
CALCIUM: 8.5 mg/dL — AB (ref 8.9–10.3)
CO2: 21 mmol/L — ABNORMAL LOW (ref 22–32)
CREATININE: 3.39 mg/dL — AB (ref 0.61–1.24)
Chloride: 102 mmol/L (ref 101–111)
GFR calc Af Amer: 17 mL/min — ABNORMAL LOW (ref >60)
GFR calc non Af Amer: 15 mL/min — ABNORMAL LOW (ref >60)
GLUCOSE: 95 mg/dL (ref 65–99)
Potassium: 4.3 mmol/L (ref 3.5–5.1)
Sodium: 133 mmol/L — ABNORMAL LOW (ref 135–145)
TOTAL PROTEIN: 5.7 g/dL — AB (ref 6.5–8.1)

## 2016-08-19 MED ORDER — SODIUM CHLORIDE 0.9 % IV BOLUS (SEPSIS)
1000.0000 mL | Freq: Once | INTRAVENOUS | Status: AC
  Administered 2016-08-19: 1000 mL via INTRAVENOUS

## 2016-08-19 MED ORDER — SODIUM CHLORIDE 0.9 % IV BOLUS (SEPSIS): 1000.0000 mL | Freq: Once | INTRAVENOUS | Status: DC

## 2016-08-19 NOTE — ED Provider Notes (Signed)
[facility] Emergency Department Provider Note ____________________________________________   I have reviewed the triage vital signs and the triage nursing note.  HISTORY  Chief Complaint "pulled tube out"   [place] Patient and wife  HPI [name] is a 83 y.o. male  presents this morning because his bile duct drainage tube had become disconnected from the bag tubing. Patient woke his wife up for help walking to the bathroom as he typically does, and she noticed that the drain was disconnected. She didn't look any further than that and brought him directly to the emergency department.  Patient states he's not had any abdominal pain. No nausea or vomiting. No fevers. He feels well.  ________________________   Drainage tubing was connected to the perc bile duct tube and twisted back together. Triage blood pressure was slightly low, repeat in the ED room was systolic in the 073X.    Past Medical History:  Diagnosis Date  . Cardiac disease   . CHF (congestive heart failure) ([place])   . Hypercholesteremia   . Hypertension   . Kidney failure    left  . Osteoporosis   . Prostate cancer ([place])   . Shingles   . Skin cancer    right cheek    Patient Active Problem List   Diagnosis Date Noted  . Gallbladder abscess 07/31/2016  . Cholecystitis 07/20/2016  . Hematuria 06/25/2016  . Sepsis ([place]) 06/25/2016  . Acute cholecystitis   . Hypokalemia 07/23/2015  . Generalized weakness 07/23/2015  . Bladder outlet obstruction 07/23/2015  . Acute delirium 07/23/2015  . Benign essential HTN 07/23/2015  . Acute on chronic renal failure ([place]) 07/20/2015  . GIB (gastrointestinal bleeding) 06/03/2015  . Kidney stone 05/25/2012  . Ureteric stone 05/25/2012  . Hydronephrosis 05/25/2012    Past Surgical History:  Procedure Laterality Date  . CARDIAC CATHETERIZATION  04/13/2014  . CT PERC CHOLECYSTOSTOMY  07/20/2016   Placed at [facility] Interventional Radiology     Prior to Admission medications   Medication Sig Start Date End Date Taking? Authorizing Provider  aspirin EC 81 MG tablet Take 81 mg by mouth at bedtime.    Historical Provider, MD  atorvastatin (LIPITOR) 40 MG tablet Take 40 mg by mouth at bedtime.     Historical Provider, MD  cefpodoxime (VANTIN) 200 MG tablet Take 1 tablet by mouth daily. 07/28/16   Historical Provider, MD  Cholecalciferol (VITAMIN D3) 5000 units TABS Take 5,000 Units by mouth daily.    Historical Provider, MD  docusate sodium (COLACE) 100 MG capsule Take 1 capsule (100 mg total) by mouth 2 (two) times daily. 07/23/16   Gladstone Lighter, MD  doxazosin (CARDURA) 4 MG tablet Take 4 mg by mouth at bedtime.     Historical Provider, MD  feeding supplement (BOOST / RESOURCE BREEZE) LIQD Take 1 Container by mouth 2 (two) times daily between meals. 07/23/16   Gladstone Lighter, MD  ferrous sulfate 325 (65 FE) MG tablet Take 325 mg by mouth 2 (two) times daily.     Historical Provider, MD  hydrALAZINE (APRESOLINE) 25 MG tablet Take 1 tablet by mouth 2 (two) times daily. 06/13/16   Historical Provider, MD  isosorbide mononitrate (IMDUR) 30 MG 24 hr tablet Take 1 tablet by mouth daily. 06/13/16   Historical Provider, MD  levothyroxine (SYNTHROID, LEVOTHROID) 100 MCG tablet Take 100 mcg by mouth daily.    Historical Provider, MD  losartan (COZAAR) 100 MG tablet Take 1 tablet by mouth daily. 06/21/16  Historical Provider, MD  metoprolol succinate (TOPROL-XL) 50 MG 24 hr tablet Take 50 mg by mouth daily. Take with or immediately following a meal.    Historical Provider, MD  nitroGLYCERIN (NITROSTAT) 0.4 MG SL tablet Place 0.4 mg under the tongue every 5 (five) minutes as needed for chest pain.    Historical Provider, MD  omega-3 acid ethyl esters (LOVAZA) 1 G capsule Take 1 capsule by mouth 2 (two) times daily.    Historical Provider, MD  pantoprazole (PROTONIX) 40 MG tablet Take 40 mg by mouth 2 (two) times daily.     Historical Provider,  MD  potassium chloride SA (K-DUR,KLOR-CON) 20 MEQ tablet Take 20 mEq by mouth 2 (two) times daily.  08/18/15   Historical Provider, MD  sodium bicarbonate 650 MG tablet Take 1,300 mg by mouth 2 (two) times daily.     Historical Provider, MD  tamsulosin (FLOMAX) 0.4 MG CAPS capsule Take 0.4 mg by mouth daily.    Historical Provider, MD  torsemide (DEMADEX) 20 MG tablet Take 20 mg by mouth 2 (two) times daily.  10/27/15   Historical Provider, MD    Allergies  Allergen Reactions  . Ativan [Lorazepam] Other (See Comments)    Hallucinations, combative  . Penicillin G Other (See Comments)    Has patient had a PCN reaction causing immediate rash, facial/tongue/throat swelling, SOB or lightheadedness with hypotension: Yes Has patient had a PCN reaction causing severe rash involving mucus membranes or skin necrosis: No Has patient had a PCN reaction that required hospitalization No Has patient had a PCN reaction occurring within the last 10 years: No If all of the above answers are "NO", then may proceed with Cephalosporin use.     Family History  Problem Relation Age of Onset  . CAD Mother     Social History Social History  Substance Use Topics  . Smoking status: Former Smoker    Types: Cigarettes, Cigars  . Smokeless tobacco: Current User    Types: Chew  . Alcohol use No    Review of Systems  Constitutional: Negative for fever. Eyes: Negative for visual changes. ENT: Negative for sore throat. Cardiovascular: Negative for chest pain. Respiratory: Negative for shortness of breath. Gastrointestinal: Negative for abdominal pain, vomiting and diarrhea. Genitourinary: Negative for dysuria. Musculoskeletal: Negative for back pain. Skin: Negative for rash. Neurological: Negative for headache. 10 point Review of Systems otherwise negative ____________________________________________   PHYSICAL EXAM:  VITAL SIGNS: ED Triage Vitals  Enc Vitals Group     BP 08/19/16 0729 (!) 96/58      Pulse Rate 08/19/16 0729 71     Resp 08/19/16 0729 18     Temp 08/19/16 0729 98 F (36.7 C)     Temp Source 08/19/16 0729 Oral     SpO2 08/19/16 0729 100 %     Weight 08/19/16 0731 140 lb (63.5 kg)     Height 08/19/16 0731 5\' 7"  (1.702 m)     Head Circumference --      Peak Flow --      Pain Score 08/19/16 0731 0     Pain Loc --      Pain Edu? --      Excl. in [place]? --      Constitutional: Alert and cooperative. Well appearing and in no distress. HEENT   Head: Normocephalic and atraumatic.      Eyes: Conjunctivae are normal. PERRL. Normal extraocular movements.      Ears:  Nose: No congestion/rhinnorhea.   Mouth/Throat: Mucous membranes are moist.   Neck: No stridor. Cardiovascular/Chest: Normal rate, regular rhythm.  No murmurs, rubs, or gallops. Respiratory: Normal respiratory effort without tachypnea nor retractions. Breath sounds are clear and equal bilaterally. No wheezes/rales/rhonchi. Gastrointestinal: Soft. No distention, no guarding, no rebound. Nontender.  2 percutaneous drains, one draining to a bulb. The other is draining to a bag with bile.  Genitourinary/rectal:Deferred Musculoskeletal: Nontender with normal range of motion in all extremities. No joint effusions.  No lower extremity tenderness.  No edema. Neurologic:  Normal speech and language. No gross or focal neurologic deficits are appreciated. Skin:  Skin is warm, dry and intact. No rash noted. Psychiatric: Mood and affect are normal. Speech and behavior are normal. Patient exhibits appropriate insight and judgment.   ____________________________________________  LABS (pertinent positives/negatives)  Labs Reviewed  COMPREHENSIVE METABOLIC PANEL - Abnormal; Notable for the following:       Result Value   Sodium 133 (*)    CO2 21 (*)    BUN 47 (*)    Creatinine, Ser 3.39 (*)    Calcium 8.5 (*)    Total Protein 5.7 (*)    Albumin 3.0 (*)    ALT 11 (*)    GFR calc non Af Amer 15 (*)     GFR calc Af Amer 17 (*)    All other components within normal limits  CBC WITH DIFFERENTIAL/PLATELET - Abnormal; Notable for the following:    WBC 3.5 (*)    RBC 3.91 (*)    Hemoglobin 11.6 (*)    HCT 33.6 (*)    RDW 15.6 (*)    Platelets 74 (*)    Lymphs Abs 0.7 (*)    All other components within normal limits  URINALYSIS, COMPLETE (UACMP) WITH MICROSCOPIC - Abnormal; Notable for the following:    Color, Urine YELLOW (*)    APPearance HAZY (*)    Glucose, UA 50 (*)    Hgb urine dipstick SMALL (*)    Leukocytes, UA LARGE (*)    Squamous Epithelial / LPF 6-30 (*)    All other components within normal limits  URINE CULTURE    ____________________________________________    EKG I, Lisa Roca, MD, the attending physician have personally viewed and interpreted all ECGs.  None ____________________________________________  RADIOLOGY All Xrays were viewed by me. Imaging interpreted by Radiologist.  None __________________________________________  PROCEDURES  Procedure(s) performed: None  Critical Care performed: None  ____________________________________________   ED COURSE / ASSESSMENT AND PLAN  Pertinent labs & imaging results that were available during my care of the patient were reviewed by me and considered in my medical decision making (see chart for details).   Mr. Luther Hearing came in with his wife due to his drinking being disconnected, and it was simply unscrewed and this was reattached.  Patient has 0 complaints and states he is asymptomatic in terms of pain, or any recent illnesses or fevers.  History of blood pressure was systolic in the 81O. Repeat was normal here, but did ask the nurse to check orthostatics and he was orthostatic. Based on this, I am going to give him a fluid bolus and check screening labs.  When I asked him further about symptoms, he states that he did feel a little bit weak all over yesterday. Again no fevers. Orthostatics much  improved after 1 L normal saline bolus. He states of course he's had nothing to drink overnight and he is willing to drink today make  sure he stays hydrated.  I discussed with him that I suspect his low blood pressure at that improved with fluids is due to dehydration. At this point I'm not suspicious of sepsis, and this patient is very well appearing overall.  Urinalysis was sent and negative for bacteria, and positive for squamous cells.  There are some leukocytes and red and white blood cells, I will send a culture for this.    CONSULTATIONS:   None   Patient / Family / Caregiver informed of clinical course, medical decision-making process, and agree with plan.   I discussed return precautions, follow-up instructions, and discharge instructions with patient and/or family.   ___________________________________________   FINAL CLINICAL IMPRESSION(S) / ED DIAGNOSES   Final diagnoses:  Acute renal failure, unspecified acute renal failure type ([place])  Dehydration              Note: This dictation was prepared with Dragon dictation. Any transcriptional errors that result from this process are unintentional    Lisa Roca, MD 08/19/16 1050

## 2016-08-19 NOTE — Discharge Instructions (Signed)
You were initially evaluated for your to becoming disconnected and this was just reconnected for you. We'll also found to have low blood pressure upon standing, felt to be due to dehydration. Your kidney function showed slight evidence of dehydration as well. He was given IV fluids here in the emergency department.  Return to emergency department for any worsening symptoms, specifically return for any fever, concern for dehydration such as dry mouth or not making urine, dizziness, weakness, passing out, or any other symptoms concerning to you.  A urine culture has been sent, if this grows an infection, you would be called to start on antibiotics.

## 2016-08-19 NOTE — ED Triage Notes (Signed)
Patient has recently had infection and abscess to Gallbladder.  Tube placed to gallbladder to drain  Approximately 2 weeks ago.  Patient arrives this morning and has pulled tube out sometime overnight.

## 2016-08-19 NOTE — ED Notes (Addendum)
When looking at patient's tube and placement, tube is still sutured in skin, appears that connection became dislodged. JP drain that patient also has in intact, connections tight and together at this time.  Pt reports he has hx of hypertension, has not taken meds today. BP low for patient per report.

## 2016-08-19 NOTE — ED Triage Notes (Signed)
Pt reports that he pulled tube out of gallbladder over night by accident. Pt has gallbladder and tube has been in place X 2 weeks. Unknown when tube was to come out. Pt alert and oriented X4, active, cooperative, pt in NAD. RR even and unlabored, color WNL.

## 2016-08-21 LAB — URINE CULTURE

## 2016-08-23 ENCOUNTER — Ambulatory Visit: Payer: Self-pay | Admitting: Surgery

## 2016-08-27 ENCOUNTER — Ambulatory Visit
Admission: RE | Admit: 2016-08-27 | Discharge: 2016-08-27 | Disposition: A | Discharge status: Home / Self Care | Payer: Medicare Other | Source: Ambulatory Visit | Attending: Surgery | Admitting: Surgery

## 2016-08-27 DIAGNOSIS — Z978 Presence of other specified devices: Secondary | ICD-10-CM | POA: Diagnosis not present

## 2016-08-27 DIAGNOSIS — K810 Acute cholecystitis: Secondary | ICD-10-CM | POA: Insufficient documentation

## 2016-08-27 DIAGNOSIS — K831 Obstruction of bile duct: Secondary | ICD-10-CM | POA: Diagnosis not present

## 2016-08-27 MED ORDER — IOPAMIDOL (ISOVUE-300) INJECTION 61%
30.0000 mL | Freq: Once | INTRAVENOUS | Status: AC | PRN
  Administered 2016-08-27: 30 mL

## 2016-08-27 MED ORDER — SODIUM CHLORIDE 0.9 % IJ SOLN
20.0000 mL | INTRAMUSCULAR | Status: DC | PRN
  Administered 2016-08-27: 20 mL
  Filled 2016-08-27: qty 20

## 2016-08-30 ENCOUNTER — Ambulatory Visit (INDEPENDENT_AMBULATORY_CARE_PROVIDER_SITE_OTHER): Payer: Medicare Other | Admitting: Surgery

## 2016-08-30 ENCOUNTER — Encounter: Payer: Self-pay | Admitting: Surgery

## 2016-08-30 VITALS — BP 110/70 | HR 59 | Temp 97.8°F | Ht 67.0 in | Wt 140.0 lb

## 2016-08-30 DIAGNOSIS — K8020 Calculus of gallbladder without cholecystitis without obstruction: Secondary | ICD-10-CM | POA: Diagnosis not present

## 2016-08-30 NOTE — Progress Notes (Signed)
CC: Hx cholecystitis and abscess Subjective: Feeling well, more than 125 cc from biliary drain. Less than 10cc drain from abscess drain. Taking some po. No fevers or chills. No evidence of obstructive jaundice. Cholangiogram personally reviewed via tube shows some ? Debris distal CBD but contrast flow into duodenum  Objective: Vital signs in last 24 hours: Reviewed    Physical exam: Debilitated male in NAD Eyes: conjunctiva clear, no jaundice Abd: soft, NT, chole tube w bile. Abscess tube w some bile stain fluid. No peritonitis Ext: well perfused, no edema  Lab Results: CBC  No results for input(s): WBC, HGB, HCT, PLT in the last 72 hours. BMET No results for input(s): NA, K, CL, CO2, GLUCOSE, BUN, CREATININE, CALCIUM in the last 72 hours. PT/INR No results for input(s): LABPROT, INR in the last 72 hours. ABG No results for input(s): PHART, HCO3 in the last 72 hours.  Invalid input(s): PCO2, PO2  Studies/Results: No results found.  Anti-infectives: Anti-infectives    None      Assessment/Plan: Cholecystitis s/p chole tube. We will Keep tubes for now. No surgical indication. I will see him in 3 weeks and reasess him. No surgical indication at this time.   Caroleen Hamman, MD, [facility]  08/30/2016

## 2016-08-30 NOTE — Patient Instructions (Signed)
We will leave both drainage tubes in place right now. We will check on you again in 3 weeks and see how you are doing. Continue to do your Physical Therapy and eat as many calories/protein as possible. See diet below.   High-Protein and High-Calorie Diet Introduction Eating high-protein and high-calorie foods can help you to gain weight, heal after an injury, and recover after an illness or surgery. What is my plan? The specific amount of daily protein and calories you need depends on:  Your body weight.  The reason this diet is recommended for you. Generally, a high-protein, high-calorie diet involves:  Eating 250-500 extra calories each day.  Making sure that 10-35% of your daily calories come from protein. Talk to your health care provider about how much protein and how many calories you need each day. Follow the diet as directed by your health care provider. What do I need to know about this diet?  Ask your health care provider if you should take a nutritional supplement.  Try to eat six small meals each day instead of three large meals.  Eat a balanced diet, including one food that is high in protein at each meal.  Keep nutritious snacks handy, such as nuts, trail mixes, dried fruit, and yogurt.  If you have kidney disease or diabetes, eating too much protein may put extra stress on your kidneys. Talk to your health care provider if you have either of those conditions. What are some high-protein foods? Grains  Quinoa. Bulgur wheat. Vegetables  Soybeans. Peas. Meats and Other Protein Sources  Beef, pork, and poultry. Fish and seafood. Eggs. Tofu. Textured vegetable protein (TVP). Peanut butter. Nuts and seeds. Dried beans. Protein powders. Dairy  Whole milk. Whole-milk yogurt. Powdered milk. Cheese. Yahoo. Eggnog. Beverages  High-protein supplement drinks. Soy milk. Other  Protein bars. The items listed above may not be a complete list of recommended foods or  beverages. Contact your dietitian for more options.  What are some high-calorie foods? Grains  Pasta. Quick breads. Muffins. Pancakes. Ready-to-eat cereal. Vegetables  Vegetables cooked in oil or butter. Fried potatoes. Fruits  Dried fruit. Fruit leather. Canned fruit in syrup. Fruit juice. Avocados. Meats and Other Protein Sources  Peanut butter. Nuts and seeds. Dairy  Heavy cream. Whipped cream. Cream cheese. Sour cream. Ice cream. Custard. Pudding. Beverages  Meal-replacement beverages. Nutrition shakes. Fruit juice. Sugar-sweetened soft drinks. Condiments  Salad dressing. Mayonnaise. Alfredo sauce. Fruit preserves or jelly. Honey. Syrup. Sweets/Desserts  Cake. Cookies. Pie. Pastries. Candy bars. Chocolate. Fats and Oils  Butter or margarine. Oil. Gravy. Other  Meal-replacement bars. The items listed above may not be a complete list of recommended foods or beverages. Contact your dietitian for more options.  What are some tips for including high-protein and high-calorie foods in my diet?  Add whole milk, half-and-half, or heavy cream to cereal, pudding, soup, or hot cocoa.  Add whole milk to instant breakfast drinks.  Add peanut butter to oatmeal or smoothies.  Add powdered milk to baked goods, smoothies, or milkshakes.  Add powdered milk, cream, or butter to mashed potatoes.  Add cheese to cooked vegetables.  Make whole-milk yogurt parfaits. Top them with granola, fruit, or nuts.  Add cottage cheese to your fruit.  Add avocados, cheese, or both to sandwiches or salads.  Add meat, poultry, or seafood to rice, pasta, casseroles, salads, and soups.  Use mayonnaise when making egg salad, chicken salad, or tuna salad.  Use peanut butter as a topping for  pretzels, celery, or crackers.  Add beans to casseroles, dips, and spreads.  Add pureed beans to sauces and soups.  Replace calorie-free drinks with calorie-containing drinks, such as milk and fruit juice. This  information is not intended to replace advice given to you by your health care provider. Make sure you discuss any questions you have with your health care provider. Document Released: 06/25/2005 Document Revised: 12/01/2015 Document Reviewed: 12/08/2013  2017 Elsevier

## 2016-09-12 ENCOUNTER — Ambulatory Visit (INDEPENDENT_AMBULATORY_CARE_PROVIDER_SITE_OTHER): Payer: Medicare Other | Admitting: Surgery

## 2016-09-12 ENCOUNTER — Encounter: Payer: Self-pay | Admitting: Surgery

## 2016-09-12 VITALS — Ht 67.0 in

## 2016-09-12 DIAGNOSIS — K8000 Calculus of gallbladder with acute cholecystitis without obstruction: Secondary | ICD-10-CM | POA: Diagnosis not present

## 2016-09-12 NOTE — Patient Instructions (Signed)
We will see you back in clinic in 2 weeks.

## 2016-09-12 NOTE — Progress Notes (Signed)
S/p perc drainage of abscess and cholecystostomy tube.  Doing better Taking PO About 10-20cc of serous output from [place] debilitated   PE NAD, elderly debilitated male, comes in a wheelchair Abd: soft, NT, JP serous fluid. Cholecystostomy tube in place .JP removed today Ext: well perfused no edema  A/P Doing well Keep cholecystostomy tube for now RTC 3 weeks

## 2016-09-26 ENCOUNTER — Encounter: Payer: Self-pay | Admitting: Surgery

## 2016-09-26 ENCOUNTER — Ambulatory Visit (INDEPENDENT_AMBULATORY_CARE_PROVIDER_SITE_OTHER): Payer: Medicare Other | Admitting: Surgery

## 2016-09-26 VITALS — BP 116/65 | HR 66 | Temp 97.7°F

## 2016-09-26 DIAGNOSIS — K819 Cholecystitis, unspecified: Secondary | ICD-10-CM

## 2016-09-26 NOTE — Progress Notes (Signed)
09/26/2016  HPI: Patient is s/p percutaneous cholecystostomy tube placement on 1/12 as well as abscess drainage on 1/12 for acute cholecystitis with abscess formation.  He was seen last on 3/7 by Dr. Dahlia Byes at which time his JP drain was removed.  Perc cholecystostomy tube remains in place.  Patient denies any fevers, chills, abdominal pain, nausea, or vomiting.  Reports that he's been eating better and the bag has been draining appropriately.  Vital signs: BP 116/65   Pulse 66   Temp 97.7 F (36.5 C) (Oral)    Physical Exam: Constitutional: No acute distress, arrives on wheelchair Abdomen: soft, nondistended, nontender to palpation.  Percutaneous cholecystostomy tube in place with bilious contents in drainage bag.    Assessment/Plan: 83 yo male s/p percutaneous cholecystostomy tube placement as well as percutaneous abscess drainage on 1/12 for acute cholecystitis with abscess formation.  --Discussed with the patient that at this point the percutaneous cholecystostomy tube is helping in case there is any further obstruction of the cystic duct.  However, on 2/18, his cholangiogram showed patent cystic duct and CBD.  Gave the patient the option of keeping the tube in place given his frail condition, considering that if he were to get sick again, it may be much harder to recover.  The other option is to do another cholangiogram and if negative, remove the drain. --The patient wishes to continue with the tube in place for now as he's continuing to improve.  He will follow up in another month for further decision making if he wants the tube out or not.   Melvyn Neth, [place]

## 2016-09-26 NOTE — Patient Instructions (Signed)
Please give [place] a call if you have any questions or concerns.  We will see you back in a month so we could decide on removing you drain.

## 2016-10-29 ENCOUNTER — Other Ambulatory Visit
Admission: RE | Admit: 2016-10-29 | Discharge: 2016-10-29 | Disposition: A | Discharge status: Home / Self Care | Payer: Medicare Other | Source: Ambulatory Visit | Attending: General Surgery | Admitting: General Surgery

## 2016-10-29 ENCOUNTER — Telehealth: Payer: Self-pay

## 2016-10-29 ENCOUNTER — Ambulatory Visit: Payer: Medicare Other

## 2016-10-29 ENCOUNTER — Telehealth: Payer: Self-pay | Admitting: General Practice

## 2016-10-29 VITALS — BP 124/66 | HR 77 | Temp 98.0°F | Ht 67.0 in | Wt 144.4 lb

## 2016-10-29 DIAGNOSIS — C61 Malignant neoplasm of prostate: Secondary | ICD-10-CM | POA: Diagnosis present

## 2016-10-29 DIAGNOSIS — R1011 Right upper quadrant pain: Secondary | ICD-10-CM

## 2016-10-29 LAB — CBC WITH DIFFERENTIAL/PLATELET
BASOS PCT: 0 %
Basophils Absolute: 0 10*3/uL (ref 0–0.1)
Eosinophils Absolute: 0.1 10*3/uL (ref 0–0.7)
Eosinophils Relative: 2 %
HCT: 35.2 % — ABNORMAL LOW (ref 40.0–52.0)
Hemoglobin: 11.9 g/dL — ABNORMAL LOW (ref 13.0–18.0)
LYMPHS PCT: 17 %
Lymphs Abs: 0.9 10*3/uL — ABNORMAL LOW (ref 1.0–3.6)
MCH: 30.3 pg (ref 26.0–34.0)
MCHC: 33.7 g/dL (ref 32.0–36.0)
MCV: 90 fL (ref 80.0–100.0)
MONOS PCT: 7 %
Monocytes Absolute: 0.4 10*3/uL (ref 0.2–1.0)
NEUTROS ABS: 3.9 10*3/uL (ref 1.4–6.5)
Neutrophils Relative %: 74 %
Platelets: 138 10*3/uL — ABNORMAL LOW (ref 150–440)
RBC: 3.91 MIL/uL — ABNORMAL LOW (ref 4.40–5.90)
RDW: 15.8 % — AB (ref 11.5–14.5)
WBC: 5.2 10*3/uL (ref 3.8–10.6)

## 2016-10-29 LAB — COMPREHENSIVE METABOLIC PANEL
ALT: 15 U/L — ABNORMAL LOW (ref 17–63)
ANION GAP: 10 (ref 5–15)
AST: 18 U/L (ref 15–41)
Albumin: 3.3 g/dL — ABNORMAL LOW (ref 3.5–5.0)
Alkaline Phosphatase: 55 U/L (ref 38–126)
BILIRUBIN TOTAL: 0.6 mg/dL (ref 0.3–1.2)
BUN: 55 mg/dL — ABNORMAL HIGH (ref 6–20)
CO2: 22 mmol/L (ref 22–32)
Calcium: 8.8 mg/dL — ABNORMAL LOW (ref 8.9–10.3)
Chloride: 103 mmol/L (ref 101–111)
Creatinine, Ser: 3.13 mg/dL — ABNORMAL HIGH (ref 0.61–1.24)
GFR calc Af Amer: 19 mL/min — ABNORMAL LOW (ref >60)
GFR, EST NON AFRICAN AMERICAN: 16 mL/min — AB (ref >60)
Glucose, Bld: 121 mg/dL — ABNORMAL HIGH (ref 65–99)
POTASSIUM: 3.9 mmol/L (ref 3.5–5.1)
Sodium: 135 mmol/L (ref 135–145)
TOTAL PROTEIN: 6.9 g/dL (ref 6.5–8.1)

## 2016-10-29 LAB — PSA: PSA: 0.12 ng/mL (ref 0.00–4.00)

## 2016-10-29 MED ORDER — SODIUM CHLORIDE 0.9 % IJ SOLN: INTRAMUSCULAR | 0 refills | Status: DC

## 2016-10-29 MED ORDER — CEFPODOXIME PROXETIL 200 MG PO TABS: 200.0000 mg | ORAL_TABLET | Freq: Two times a day (BID) | ORAL | 0 refills | Status: DC

## 2016-10-29 NOTE — Telephone Encounter (Signed)
Patient wife, Rod Holler, called said patient has a drainage that is leaking and isn't going into the bag. He has a bile duct drainage tube. Please call patient and advice.

## 2016-10-29 NOTE — Telephone Encounter (Signed)
Reviewed results of labwork with Dr. Adonis Huguenin. No new orders at this time.

## 2016-10-29 NOTE — Telephone Encounter (Signed)
LaQuita from [facility] called wanting to know how much she needed to drain from patient's cholecystostomy bag.  After reading Amber's notes, I told LaQuita to flush 20 cc's with normal saline three times a week. She understood and stated that she would do. She also wanted to let us know that we needed to send a prescription of the saline to his preferred pharmacy.

## 2016-10-29 NOTE — Telephone Encounter (Addendum)
Awaiting results of labs at this time.  [facility] was called. I spoke with Amy. Verbal orders were given to increase visits to three times weekly for the next 2 weeks to flush Cholecystostomy drain in addition to normal visits.  Call once again made to specialty scheduling. Spoke with Pamala Hurry. She needs to speak with nurse in regards to Aspirin and then she will return phone call to get patient scheduled.  Culture sent to [facility]. Order placed in EPIC.

## 2016-10-29 NOTE — Patient Instructions (Signed)
We will send you to the [facility] for labs at this time. I will call you with the results.  Directions to Medical Mall: When leaving our office, go right. Go all of the way down to the very end of the hallway. You will have a purple wall in front of you. You will now have a tunnel to the hospital on your left hand side. Go through this tunnel and the elevators will be on your left. Go down to the 1st floor and take a slight left. The very first desk on the right hand side is the registration desk.  I am sending your antibiotic to your Pharmacy. Please pick this up and begin today.  I will schedule you for a Cholangiogram and I will call you as soon as this is scheduled.  We will also have Home Health flushing this three times weekly, I will call the nurse today to make these changes.

## 2016-10-29 NOTE — Telephone Encounter (Signed)
Spoke with patient's wife at this time. Results of labs from today have been reviewed. She was given all information regarding T-Tube Cholangiogram tomorrow morning. She was told about the Home Health Nurse increasing visits to three times weekly with flushes every visit.   I will call her back with results and recommendations after T-Tube cholangiogram have been received tomorrow.

## 2016-10-29 NOTE — Progress Notes (Signed)
Patient brought back to procedure room. Drain was flushed with 20cc Normal Saline. Initially returned clear, bile tinged fluid, Then, after 20cc, I obtained 16cc of thick purulent greenish-gray fluid. The gravity bag was replaced on to the drain and continued to drain green tinged fluid into the bag. Culture was obtained.   Spoke with Dr. Adonis Huguenin. He would like to have CBC and CMP drawn today, start patient back on antibiotic, make a call to home health nurse to restart flushed three times weekly, and schedule T-Tube cholangiogram prior to seeing Dr. Dahlia Byes next week.   All of this was explained to patient and wife. I also explained that if patient begins to feel worse, develops fever/chills, nausea/vomiting, or having worsening abdominal pain- he is to call our office and speak with a nurse immediately or go to the Emergency Department if we are not open. Patient and wife verbalize understanding.  Lab requisitions printed and given to patient's wife to be drawn at medical mall STAT.   Vantin 200mg  BID PO x 14 days ordered and sent to preferred pharmacy.  T-Tube Cholangiogram ordered. Call to specialty scheduling at this time. No answer. Left voicemail for return phone call.

## 2016-10-29 NOTE — Telephone Encounter (Signed)
Returned phone call to wife at this time. Wife states that patient began having drainage saturating the dressing yesterday evening. This has been changed multiple times last night and today with same outcome. She states that there no drainage in the bag at all.   Patient's wife was asked to bring patient in this morning for evaluation. She verbalizes understanding. Patient place on schedule for 11am for nurse visit to flush drain.

## 2016-10-29 NOTE — Telephone Encounter (Signed)
Spoke with Pamala Hurry in speciality scheduling. She would like to have patient here tomorrow, 10/30/16 at 0930am. He may eat.  He is to take his aspirin normally in the am per Nurse.

## 2016-10-30 ENCOUNTER — Ambulatory Visit
Admission: RE | Admit: 2016-10-30 | Discharge: 2016-10-30 | Disposition: A | Discharge status: Home / Self Care | Payer: Medicare Other | Source: Ambulatory Visit | Attending: General Surgery | Admitting: General Surgery

## 2016-10-30 DIAGNOSIS — R1011 Right upper quadrant pain: Secondary | ICD-10-CM | POA: Insufficient documentation

## 2016-10-30 DIAGNOSIS — Z978 Presence of other specified devices: Secondary | ICD-10-CM | POA: Diagnosis not present

## 2016-10-30 MED ORDER — SODIUM CHLORIDE 0.9 % IJ SOLN
20.0000 mL | Freq: Once | INTRAMUSCULAR | Status: AC
  Administered 2016-10-30: 20 mL via INTRAVENOUS

## 2016-10-30 MED ORDER — IOPAMIDOL (ISOVUE-300) INJECTION 61%
30.0000 mL | Freq: Once | INTRAVENOUS | Status: AC | PRN
  Administered 2016-10-30: 30 mL

## 2016-10-31 ENCOUNTER — Telehealth: Payer: Self-pay

## 2016-10-31 NOTE — Telephone Encounter (Signed)
[name] with [facility] [phone]  [name] has been seeing [place] because of the tubes coming out of his gallbladder. It is time to resubmit his orders for another 90 days and she wanted our consent to fax them over to [place]. I okayed the consent.

## 2016-11-02 LAB — BODY FLUID CULTURE

## 2016-11-07 ENCOUNTER — Ambulatory Visit (INDEPENDENT_AMBULATORY_CARE_PROVIDER_SITE_OTHER): Payer: Medicare Other | Admitting: Surgery

## 2016-11-07 ENCOUNTER — Encounter: Payer: Self-pay | Admitting: Surgery

## 2016-11-07 VITALS — BP 112/73 | HR 58 | Temp 97.8°F | Ht 67.0 in | Wt 140.0 lb

## 2016-11-07 DIAGNOSIS — Z09 Encounter for follow-up examination after completed treatment for conditions other than malignant neoplasm: Secondary | ICD-10-CM

## 2016-11-07 NOTE — Patient Instructions (Addendum)
Please call our office with any questions or concerns prior to your next appointment.  If you begin to feel bad, have decreased appetite, fever, nausea/vomiting, or any other problems- please call our office and speak with Museum/gallery conservator (Nurse).

## 2016-11-07 NOTE — Progress Notes (Signed)
Patient is s/p percutaneous cholecystostomy tube placement on 1/12 as well as abscess drainage on 1/12 for acute cholecystitis with abscess formation.   He did have an episode where and there was some purulent drainage from his cholecystostomy tube. We obtained a cholangiogram showing patency of the cholecystostomy tube and the be resistant. I personally reviewed these images. He was also placed on antibiotics and we did obtain cultures from his cholecystostomy tube. He grew Escherichia coli and enterococci. He was placed in ceftaz edema he has responded very well. He is taking by mouth. He is ambulating and gaining weight. No fevers or chills no evidence of cholangitis  PE NAD Abd: soft, NT, cholecystostomy tube place . Bile drainage. No peritonitis  A/P Doing well Continue cholecystostomy tube Complete A/Bs course F/U 2-3 weeks No surgical issues at this time

## 2016-11-14 ENCOUNTER — Inpatient Hospital Stay: Payer: Medicare Other | Attending: Radiation Oncology

## 2016-11-15 ENCOUNTER — Ambulatory Visit (INDEPENDENT_AMBULATORY_CARE_PROVIDER_SITE_OTHER): Payer: Medicare Other | Admitting: Surgery

## 2016-11-15 ENCOUNTER — Encounter: Payer: Self-pay | Admitting: Surgery

## 2016-11-15 VITALS — BP 115/73 | HR 56 | Temp 97.7°F | Ht 67.0 in

## 2016-11-15 DIAGNOSIS — T8579XD Infection and inflammatory reaction due to other internal prosthetic devices, implants and grafts, subsequent encounter: Secondary | ICD-10-CM

## 2016-11-15 NOTE — Patient Instructions (Signed)
Please continue with flushing the drain as your have been doing. Please see your follow up appointment as listed below.

## 2016-11-15 NOTE — Progress Notes (Signed)
11/15/2016  History of Present Illness: [name] is a 83 y.o. male s/p percutaneous cholecystostomy tube placement on 1/12 as well as percutaneous abscess drainage on 1/12 for acute cholecystitis.  He has been seen multiple times since then for evaluation of his drains and his abscess drain has since been removed.  He had issues with his drain complicated by cholecystostomy tube infection on 4/23, with cultures growing E coli and enterococcus faecalis.  He was given a prescription for antibiotics which he finished a few days ago.  A drain study was done as well on 4/24 which showed a patent cystic duct and common bile duct.  He reports no further issues with the cholecystostomy tube.  There is no further purulence and no further leakage around the drain.  The patient's wife has been flushing the drain and has not had any issues with it.  The patient denies any fevers, chills, abdominal pain, nausea, or vomiting.  He reports that he's stronger and is able to walk more and be outside in his garden.  His appetite is good and he is gaining weight.  Past Medical History: Past Medical History:  Diagnosis Date  . Cardiac disease   . CHF (congestive heart failure) ([place])   . Hypercholesteremia   . Hypertension   . Kidney failure    left  . Osteoporosis   . Prostate cancer ([place])   . Shingles   . Skin cancer    right cheek     Past Surgical History: Past Surgical History:  Procedure Laterality Date  . CARDIAC CATHETERIZATION  04/13/2014  . CT PERC CHOLECYSTOSTOMY  07/20/2016   Placed at [facility] Interventional Radiology    Home Medications: Prior to Admission medications   Medication Sig Start Date End Date Taking? Authorizing Provider  aspirin EC 81 MG tablet Take 81 mg by mouth at bedtime.   Yes [provider]  atorvastatin (LIPITOR) 40 MG tablet Take 40 mg by mouth at bedtime.    Yes [provider]  cefpodoxime (VANTIN) 200 MG tablet Take 1 tablet (200 mg total) by  mouth 2 (two) times daily. 10/29/16  Yes Clayburn Pert, MD  Cholecalciferol (VITAMIN D3) 5000 units TABS Take 5,000 Units by mouth daily.   Yes [provider]  docusate sodium (COLACE) 100 MG capsule Take 1 capsule (100 mg total) by mouth 2 (two) times daily. 07/23/16  Yes Gladstone Lighter, MD  doxazosin (CARDURA) 4 MG tablet Take 4 mg by mouth at bedtime.    Yes [provider]  feeding supplement (BOOST / RESOURCE BREEZE) LIQD Take 1 Container by mouth 2 (two) times daily between meals. 07/23/16  Yes Gladstone Lighter, MD  ferrous sulfate 325 (65 FE) MG tablet Take 325 mg by mouth 2 (two) times daily.    Yes [provider]  hydrALAZINE (APRESOLINE) 25 MG tablet Take 1 tablet by mouth 2 (two) times daily. 06/13/16  Yes [provider]  isosorbide mononitrate (IMDUR) 30 MG 24 hr tablet Take 1 tablet by mouth daily. 06/13/16  Yes [provider]  levothyroxine (SYNTHROID, LEVOTHROID) 100 MCG tablet Take 100 mcg by mouth daily.   Yes [provider]  losartan (COZAAR) 100 MG tablet Take 1 tablet by mouth daily. 06/21/16  Yes [provider]  metoprolol succinate (TOPROL-XL) 50 MG 24 hr tablet Take 50 mg by mouth daily. Take with or immediately following a meal.   Yes [provider]  nitroGLYCERIN (NITROSTAT) 0.4 MG SL tablet Place 0.4  mg under the tongue every 5 (five) minutes as needed for chest pain.   Yes [provider]  omega-3 acid ethyl esters (LOVAZA) 1 G capsule Take 1 capsule by mouth 2 (two) times daily.   Yes [provider]  pantoprazole (PROTONIX) 40 MG tablet Take 40 mg by mouth 2 (two) times daily.    Yes [provider]  potassium chloride SA (K-DUR,KLOR-CON) 20 MEQ tablet Take 20 mEq by mouth 2 (two) times daily.  08/18/15  Yes [provider]  sodium bicarbonate 650 MG tablet Take 1,300 mg by mouth 2 (two) times daily.    Yes [provider]  sodium chloride 0.9 %  injection Inject 20 MLs into cholecystostomy drain three times weekly. 10/29/16  Yes Clayburn Pert, MD  tamsulosin (FLOMAX) 0.4 MG CAPS capsule Take 0.4 mg by mouth daily.   Yes [provider]  torsemide (DEMADEX) 20 MG tablet Take 20 mg by mouth 2 (two) times daily.  10/27/15  Yes [provider]    Allergies: Allergies  Allergen Reactions  . Ativan [Lorazepam] Other (See Comments)    Hallucinations, combative  . Penicillin G Other (See Comments)    Has patient had a PCN reaction causing immediate rash, facial/tongue/throat swelling, SOB or lightheadedness with hypotension: Yes Has patient had a PCN reaction causing severe rash involving mucus membranes or skin necrosis: No Has patient had a PCN reaction that required hospitalization No Has patient had a PCN reaction occurring within the last 10 years: No If all of the above answers are "NO", then may proceed with Cephalosporin use.     Review of Systems: Review of Systems  Constitutional: Negative for chills and fever.  Respiratory: Negative for shortness of breath.   Cardiovascular: Negative for chest pain.  Gastrointestinal: Negative for abdominal pain, nausea and vomiting.  Musculoskeletal: Negative for myalgias.    Physical Exam BP 115/73   Pulse (!) 56   Temp 97.7 F (36.5 C) (Oral)   Ht 5\' 7"  (1.702 m)  CONSTITUTIONAL: No acute distress HEENT:  Normocephalic, atraumatic, extraocular motion intact. RESPIRATORY:  Lungs are clear, and breath sounds are equal bilaterally. Normal respiratory effort without pathologic use of accessory muscles. CARDIOVASCULAR: Heart is regular without murmurs, gallops, or rubs. GI: The abdomen is soft, nondistended, nontender to palpation.  Percutaneous cholecystostomy tube in place with clear bilious drainage in bag.  There is no leakage around the tube and there is no purulent fluid  The skin has no erythema or induration.  PSYCH:  Alert and oriented to person, place and  time. Affect is normal.  Labs/Imaging: None  Assessment and Plan: This is a 83 y.o. male with percutaneous cholecystostomy tube infection, currently improved and completed course of antibiotics.    His most recent tube study on 4/24 shows a patent cystic duct and common bile duct.  The patient has improved and has not had any issues with his drain.  He just finished a course of antibiotics and I would like to see how he does over the next week on his own.  He reports that he would like to get rid of the drain if possible, and at this point, as long as he continues to do well off antibiotics without any drain complications, it would be reasonable to remove the drain.  He will follow up next week with Dr. Dahlia Byes, at which time he can discuss with the patient potential drain removal.  Face-to-face time spent with the patient and care providers  was 25 minutes, with more than 50% of the time spent counseling, educating, and coordinating care of the patient.     Melvyn Neth, [place]

## 2016-11-21 ENCOUNTER — Encounter: Payer: Self-pay | Admitting: Radiation Oncology

## 2016-11-21 ENCOUNTER — Ambulatory Visit
Admission: RE | Admit: 2016-11-21 | Discharge: 2016-11-21 | Disposition: A | Discharge status: Home / Self Care | Payer: Medicare Other | Source: Ambulatory Visit | Attending: Radiation Oncology | Admitting: Radiation Oncology

## 2016-11-21 ENCOUNTER — Other Ambulatory Visit: Payer: Self-pay | Admitting: *Deleted

## 2016-11-21 VITALS — BP 121/72 | HR 64 | Temp 97.8°F | Resp 20 | Wt 142.3 lb

## 2016-11-21 DIAGNOSIS — Z923 Personal history of irradiation: Secondary | ICD-10-CM | POA: Insufficient documentation

## 2016-11-21 DIAGNOSIS — C61 Malignant neoplasm of prostate: Secondary | ICD-10-CM

## 2016-11-21 NOTE — Progress Notes (Signed)
Radiation Oncology Follow up Note  Name: [name]   Date:   11/21/2016 MRN:  [id number] DOB: 06/28/1927    This 83 y.o. male presents to the clinic today for follow-up for prostate cancer.  REFERRING PROVIDER: Lorelee Market, MD  HPI: Patient is an 83 year old male treated back in 2006 for Gleason 7 adenocarcinoma the prostate. His been on pulse Lupron therapy since 2015. We are following his PSA and most recently it was 0.12 on 10/29/2016. His last Lupron shot was in November 2017. He specifically denies bone pain or any lower urinary tract symptoms. He does have an external catheter present draining his gallbladder which became infected and he was septic. He is slowly recovering from that..  COMPLICATIONS OF TREATMENT: none  FOLLOW UP COMPLIANCE: keeps appointments   PHYSICAL EXAM:  BP 121/72   Pulse 64   Temp 97.8 F (36.6 C)   Resp 20   Wt 142 lb 4.9 oz (64.5 kg)   BMI 22.29 kg/m  Well-developed male in NAD. Does have a draining catheter present from his gallbladder. Well-developed well-nourished patient in NAD. HEENT reveals PERLA, EOMI, discs not visualized.  Oral cavity is clear. No oral mucosal lesions are identified. Neck is clear without evidence of cervical or supraclavicular adenopathy. Lungs are clear to A&P. Cardiac examination is essentially unremarkable with regular rate and rhythm without murmur rub or thrill. Abdomen is benign with no organomegaly or masses noted. Motor sensory and DTR levels are equal and symmetric in the upper and lower extremities. Cranial nerves II through XII are grossly intact. Proprioception is intact. No peripheral adenopathy or edema is identified. No motor or sensory levels are noted. Crude visual fields are within normal range.  RADIOLOGY RESULTS: No current films for review  PLAN: Present time I believe the patient alone with no more Lupron therapy at this time. I'll reevaluate him with a repeat PSA in 6 months. Should be stable  at that time will go back to once a year visits. Patient and wife compromise treatment plan well.  I would like to take this opportunity to thank you for allowing me to participate in the care of your patient.Armstead Peaks., MD

## 2016-11-21 NOTE — Addendum Note (Signed)
Encounter addended by: Daiva Huge, RN on: 11/21/2016  2:23 PM<BR>    Actions taken: Flowsheet accepted

## 2016-11-21 NOTE — Addendum Note (Signed)
Encounter addended by: Noreene Filbert, MD on: 11/21/2016  3:26 PM<BR>    Actions taken: LOS modified, Follow-up modified, Sign clinical note

## 2016-11-22 ENCOUNTER — Ambulatory Visit: Payer: Medicare Other | Admitting: Surgery

## 2016-11-22 ENCOUNTER — Encounter: Payer: Self-pay | Admitting: Surgery

## 2016-11-22 ENCOUNTER — Ambulatory Visit (INDEPENDENT_AMBULATORY_CARE_PROVIDER_SITE_OTHER): Payer: Medicare Other | Admitting: Surgery

## 2016-11-22 VITALS — BP 107/69 | HR 65 | Temp 97.8°F | Ht 67.0 in

## 2016-11-22 DIAGNOSIS — Z09 Encounter for follow-up examination after completed treatment for conditions other than malignant neoplasm: Secondary | ICD-10-CM

## 2016-11-22 NOTE — Progress Notes (Signed)
Patient is s/p percutaneous cholecystostomy tube placement on 1/12 as well as abscess drainage on 1/12 for acute cholecystitis with abscess formation.   He did have an episode where and there was some purulent drainage from his cholecystostomy tube. We obtained a cholangiogram showing patency of the biliary system. He has done very well and is asymptomatic at this time  He is taking by mouth. He is ambulating and gaining weight. No fevers or chills no evidence of cholangitis   ROS: full ROS performed and is otherwise negative.  PE NAD Abd: soft, NT, cholecystostomy tube place . Bile drainage. No peritonitis Cholecystostomy tube removed Neuro: awake , alert, GCS 15. No motor or sens deficits  A/P Doing well D/W the pt in detail and he wishes to have his drain removed. There were no complications, I did warn him about the risks of recurrent cholecystitis and he understands.

## 2016-11-22 NOTE — Patient Instructions (Signed)
Please call our office with any questions or concerns.  Please do not submerge in a tub, hot tub, or pool until incisions are completely sealed.   At that time- Listen to your body when lifting, if you have pain when lifting, stop and then try again in a few days. Pain after doing exercises or activities of daily living is normal as you get back in to your normal routine.  If you develop redness, drainage, or pain at incision sites- call our office immediately and speak with a nurse.

## 2017-02-04 ENCOUNTER — Encounter: Payer: Self-pay | Admitting: Emergency Medicine

## 2017-02-04 ENCOUNTER — Emergency Department
Admission: EM | Admit: 2017-02-04 | Discharge: 2017-02-04 | Disposition: A | Discharge status: Home / Self Care | Payer: Medicare Other | Attending: Emergency Medicine | Admitting: Emergency Medicine

## 2017-02-04 DIAGNOSIS — I120 Hypertensive chronic kidney disease with stage 5 chronic kidney disease or end stage renal disease: Secondary | ICD-10-CM | POA: Insufficient documentation

## 2017-02-04 DIAGNOSIS — L03311 Cellulitis of abdominal wall: Secondary | ICD-10-CM | POA: Diagnosis not present

## 2017-02-04 DIAGNOSIS — N185 Chronic kidney disease, stage 5: Secondary | ICD-10-CM | POA: Diagnosis not present

## 2017-02-04 DIAGNOSIS — Z79899 Other long term (current) drug therapy: Secondary | ICD-10-CM | POA: Insufficient documentation

## 2017-02-04 DIAGNOSIS — Z7982 Long term (current) use of aspirin: Secondary | ICD-10-CM | POA: Diagnosis not present

## 2017-02-04 DIAGNOSIS — F17220 Nicotine dependence, chewing tobacco, uncomplicated: Secondary | ICD-10-CM | POA: Insufficient documentation

## 2017-02-04 DIAGNOSIS — I509 Heart failure, unspecified: Secondary | ICD-10-CM | POA: Insufficient documentation

## 2017-02-04 DIAGNOSIS — L02211 Cutaneous abscess of abdominal wall: Secondary | ICD-10-CM | POA: Diagnosis present

## 2017-02-04 MED ORDER — MUPIROCIN CALCIUM 2 % EX CREA: TOPICAL_CREAM | CUTANEOUS | 0 refills | Status: DC

## 2017-02-04 MED ORDER — SULFAMETHOXAZOLE-TRIMETHOPRIM 800-160 MG PO TABS: 1.0000 | ORAL_TABLET | Freq: Two times a day (BID) | ORAL | 0 refills | Status: DC

## 2017-02-04 NOTE — ED Provider Notes (Signed)
[facility] Emergency Department Provider Note  ____________________________________________  Time seen: Approximately 3:52 PM  I have reviewed the triage vital signs and the nursing notes.   HISTORY  Chief Complaint Abscess    HPI [name] is a 83 y.o. male who presents emergency department complaining of a boil to the right upper abdominal wall. Patient reports that he first noticed a red "bump" to the right upper abdominal wall approximately 3 days ago. It increased in size and developed a lightheaded. This ruptured estrangement minimal amount of pus. Patient denies significant pain to the region. He denies any fevers or chills, abdominal pain, nausea or vomiting. Patient is concerned that he may have a skin infection to the site. No other complaints at this time.  Patient has a significant history of cardiac disease, CHF, hypertension, renal failure, cholecystitis, osteoporosis, prostate cancer. Patient denies any complaints at these chronic medical conditions at this time.   Past Medical History:  Diagnosis Date  . Cardiac disease   . CHF (congestive heart failure) ([place])   . Hypercholesteremia   . Hypertension   . Kidney failure    left  . Osteoporosis   . Prostate cancer ([place])   . Shingles   . Skin cancer    right cheek    Patient Active Problem List   Diagnosis Date Noted  . Gallbladder abscess 07/31/2016  . Cholecystitis 07/20/2016  . Hematuria 06/25/2016  . Sepsis ([place]) 06/25/2016  . Acute cholecystitis   . Hypokalemia 07/23/2015  . Generalized weakness 07/23/2015  . Bladder outlet obstruction 07/23/2015  . Acute delirium 07/23/2015  . Benign essential HTN 07/23/2015  . Acute on chronic renal failure ([place]) 07/20/2015  . GIB (gastrointestinal bleeding) 06/03/2015  . Kidney stone 05/25/2012  . Ureteric stone 05/25/2012  . Hydronephrosis 05/25/2012  . Chronic kidney disease, stage IV (severe) ([place]) 05/25/2012    Past Surgical  History:  Procedure Laterality Date  . CARDIAC CATHETERIZATION  04/13/2014  . CT PERC CHOLECYSTOSTOMY  07/20/2016   Placed at [facility] Interventional Radiology    Prior to Admission medications   Medication Sig Start Date End Date Taking? Authorizing Provider  aspirin EC 81 MG tablet Take 81 mg by mouth at bedtime.    [provider]  atorvastatin (LIPITOR) 40 MG tablet Take 40 mg by mouth at bedtime.     [provider]  Cholecalciferol (VITAMIN D3) 5000 units TABS Take 5,000 Units by mouth daily.    [provider]  docusate sodium (COLACE) 100 MG capsule Take 1 capsule (100 mg total) by mouth 2 (two) times daily. 07/23/16   Gladstone Lighter, MD  doxazosin (CARDURA) 4 MG tablet Take 4 mg by mouth at bedtime.     [provider]  feeding supplement (BOOST / RESOURCE BREEZE) LIQD Take 1 Container by mouth 2 (two) times daily between meals. 07/23/16   Gladstone Lighter, MD  ferrous sulfate 325 (65 FE) MG tablet Take 325 mg by mouth 2 (two) times daily.     [provider]  hydrALAZINE (APRESOLINE) 25 MG tablet Take 1 tablet by mouth 2 (two) times daily. 06/13/16   [provider]  isosorbide mononitrate (IMDUR) 30 MG 24 hr tablet Take 1 tablet by mouth daily. 06/13/16   [provider]  levothyroxine (SYNTHROID, LEVOTHROID) 100 MCG tablet Take 100 mcg by mouth daily.    [provider]  losartan (COZAAR) 100 MG tablet Take 1 tablet by mouth daily. 06/21/16   [provider]  metoprolol succinate (TOPROL-XL) 50 MG 24 hr tablet Take 50 mg by mouth daily. Take with or immediately following a meal.    [provider]  mupirocin cream (BACTROBAN) 2 % Apply to affected area 3 times daily 02/04/17   Shadavia Dampier, Charline Bills, PA-C  nitroGLYCERIN (NITROSTAT) 0.4 MG SL tablet Place 0.4 mg under the tongue every 5 (five) minutes as needed for chest pain.    [provider]  omega-3 acid ethyl esters (LOVAZA) 1 G  capsule Take 1 capsule by mouth 2 (two) times daily.    [provider]  pantoprazole (PROTONIX) 40 MG tablet Take 40 mg by mouth 2 (two) times daily.     [provider]  potassium chloride SA (K-DUR,KLOR-CON) 20 MEQ tablet Take 20 mEq by mouth 2 (two) times daily.  08/18/15   [provider]  sodium bicarbonate 650 MG tablet Take 1,300 mg by mouth 2 (two) times daily.     [provider]  sodium chloride 0.9 % injection Inject 20 MLs into cholecystostomy drain three times weekly. 10/29/16   Clayburn Pert, MD  sulfamethoxazole-trimethoprim (BACTRIM DS,SEPTRA DS) 800-160 MG tablet Take 1 tablet by mouth 2 (two) times daily. 02/04/17   Nolah Krenzer, Charline Bills, PA-C  tamsulosin (FLOMAX) 0.4 MG CAPS capsule Take 0.4 mg by mouth daily.    [provider]  torsemide (DEMADEX) 20 MG tablet Take 20 mg by mouth 2 (two) times daily.  10/27/15   [provider]    Allergies Ativan [lorazepam] and Penicillin g  Family History  Problem Relation Age of Onset  . CAD Mother   . Cancer Mother   . Heart attack Father     Social History Social History  Substance Use Topics  . Smoking status: Former Smoker    Types: Cigarettes, Cigars  . Smokeless tobacco: Current User    Types: Chew  . Alcohol use No     Review of Systems  Constitutional: No fever/chills Eyes: No visual changes. Cardiovascular: no chest pain. Respiratory: no cough. No SOB. Gastrointestinal: No abdominal pain.  No nausea, no vomiting.  No diarrhea.  No constipation. Musculoskeletal: Negative for musculoskeletal pain. Skin: Positive for "boil" to the right upper abdominal wall. Neurological: Negative for headaches, focal weakness or numbness. 10-point ROS otherwise negative.  ____________________________________________   PHYSICAL EXAM:  VITAL SIGNS: ED Triage Vitals  Enc Vitals Group     BP 02/04/17 1504 (!) 124/57     Pulse Rate 02/04/17 1504 66     Resp 02/04/17  1504 18     Temp 02/04/17 1504 98.4 F (36.9 C)     Temp Source 02/04/17 1504 Oral     SpO2 02/04/17 1504 98 %     Weight 02/04/17 1505 150 lb (68 kg)     Height 02/04/17 1504 5\' 7"  (1.702 m)     Head Circumference --      Peak Flow --      Pain Score --      Pain Loc --      Pain Edu? --      Excl. in [place]? --      Constitutional: Alert and oriented. Well appearing and in no acute distress. Eyes: Conjunctivae are normal. PERRL. EOMI. Head: Atraumatic. Neck: No stridor.   Hematological/Lymphatic/Immunilogical: No cervical lymphadenopathy. Cardiovascular: Normal rate, regular rhythm. Normal S1 and S2.  Good peripheral circulation. Respiratory: Normal respiratory effort without tachypnea or retractions. Lungs CTAB. Good air entry to the bases with  no decreased or absent breath sounds. Gastrointestinal: Bowel sounds 4 quadrants. Soft and nontender to palpation. No guarding or rigidity. No palpable masses. No distention.  Musculoskeletal: Full range of motion to all extremities. No gross deformities appreciated. Neurologic:  Normal speech and language. No gross focal neurologic deficits are appreciated.  Skin:  Skin is warm, dry and intact. No rash noted. Small erythematous and edematous lesion noted to the right upper abdominal wall. Measures approximately 2 cm in diameter. Central excoriation with minimal pustular drainage. No induration or fluctuance. Psychiatric: Mood and affect are normal. Speech and behavior are normal. Patient exhibits appropriate insight and judgement.   ____________________________________________   LABS (all labs ordered are listed, but only abnormal results are displayed)  Labs Reviewed - No data to display ____________________________________________  EKG   ____________________________________________  RADIOLOGY   No results found.  ____________________________________________    PROCEDURES  Procedure(s) performed:     Procedures    Medications - No data to display   ____________________________________________   INITIAL IMPRESSION / ASSESSMENT AND PLAN / ED COURSE  Pertinent labs & imaging results that were available during my care of the patient were reviewed by me and considered in my medical decision making (see chart for details).  Review of the [place] CSRS was performed in accordance of the [place] prior to dispensing any controlled drugs.     Patient's diagnosis is consistent with cellulitis to the right upper abdominal wall. No appreciable abscess requiring imaging, labs, incision and drainage.. Patient will be discharged home with prescriptions for oral and topical antibiotics.. Patient is to follow up with primary care as needed or otherwise directed. Patient is given ED precautions to return to the ED for any worsening or new symptoms.     ____________________________________________  FINAL CLINICAL IMPRESSION(S) / ED DIAGNOSES  Final diagnoses:  Cellulitis of abdominal wall      NEW MEDICATIONS STARTED DURING THIS VISIT:  New Prescriptions   MUPIROCIN CREAM (BACTROBAN) 2 %    Apply to affected area 3 times daily   SULFAMETHOXAZOLE-TRIMETHOPRIM (BACTRIM DS,SEPTRA DS) 800-160 MG TABLET    Take 1 tablet by mouth 2 (two) times daily.        This chart was dictated using voice recognition software/Dragon. Despite best efforts to proofread, errors can occur which can change the meaning. Any change was purely unintentional.    Darletta Moll, PA-C 02/04/17 1609    Lisa Roca, MD 02/04/17 2005

## 2017-02-04 NOTE — ED Notes (Signed)
See triage note  States he noticed a possible abscess area to right side of abd a few days ago,  Per wife the area started to drain today

## 2017-02-04 NOTE — ED Triage Notes (Signed)
Pt reports boil to the right side of his upper abdomen. Pt reports his wife has been applying alcohol. Small open lesion noted to right upper abdomen, no redness or swelling noted. Pt denies any pain.

## 2017-02-25 ENCOUNTER — Emergency Department
Admission: EM | Admit: 2017-02-25 | Discharge: 2017-02-25 | Disposition: A | Discharge status: Home / Self Care | Payer: Medicare Other | Attending: Student in an Organized Health Care Education/Training Program | Admitting: Student in an Organized Health Care Education/Training Program

## 2017-02-25 ENCOUNTER — Encounter: Payer: Self-pay | Admitting: *Deleted

## 2017-02-25 DIAGNOSIS — F17220 Nicotine dependence, chewing tobacco, uncomplicated: Secondary | ICD-10-CM | POA: Insufficient documentation

## 2017-02-25 DIAGNOSIS — Y939 Activity, unspecified: Secondary | ICD-10-CM | POA: Insufficient documentation

## 2017-02-25 DIAGNOSIS — Y929 Unspecified place or not applicable: Secondary | ICD-10-CM | POA: Insufficient documentation

## 2017-02-25 DIAGNOSIS — Z7982 Long term (current) use of aspirin: Secondary | ICD-10-CM | POA: Diagnosis not present

## 2017-02-25 DIAGNOSIS — Y999 Unspecified external cause status: Secondary | ICD-10-CM | POA: Diagnosis not present

## 2017-02-25 DIAGNOSIS — I130 Hypertensive heart and chronic kidney disease with heart failure and stage 1 through stage 4 chronic kidney disease, or unspecified chronic kidney disease: Secondary | ICD-10-CM | POA: Insufficient documentation

## 2017-02-25 DIAGNOSIS — T8130XA Disruption of wound, unspecified, initial encounter: Secondary | ICD-10-CM

## 2017-02-25 DIAGNOSIS — Y69 Unspecified misadventure during surgical and medical care: Secondary | ICD-10-CM | POA: Insufficient documentation

## 2017-02-25 DIAGNOSIS — T8132XA Disruption of internal operation (surgical) wound, not elsewhere classified, initial encounter: Secondary | ICD-10-CM | POA: Insufficient documentation

## 2017-02-25 DIAGNOSIS — N184 Chronic kidney disease, stage 4 (severe): Secondary | ICD-10-CM | POA: Diagnosis not present

## 2017-02-25 DIAGNOSIS — Z79899 Other long term (current) drug therapy: Secondary | ICD-10-CM | POA: Insufficient documentation

## 2017-02-25 DIAGNOSIS — I509 Heart failure, unspecified: Secondary | ICD-10-CM | POA: Diagnosis not present

## 2017-02-25 DIAGNOSIS — S31100A Unspecified open wound of abdominal wall, right upper quadrant without penetration into peritoneal cavity, initial encounter: Secondary | ICD-10-CM | POA: Diagnosis present

## 2017-02-25 MED ORDER — SULFAMETHOXAZOLE-TRIMETHOPRIM 800-160 MG PO TABS
1.0000 | ORAL_TABLET | Freq: Once | ORAL | Status: AC
  Administered 2017-02-25: 1 via ORAL
  Filled 2017-02-25: qty 1

## 2017-02-25 MED ORDER — SULFAMETHOXAZOLE-TRIMETHOPRIM 800-160 MG PO TABS: 1.0000 | ORAL_TABLET | Freq: Two times a day (BID) | ORAL | 0 refills | Status: DC

## 2017-02-25 NOTE — ED Notes (Signed)
See triage note  Per family he was seen for a draining wound to abd about a month ago or so  States area cleared up and then developed again this weekend

## 2017-02-25 NOTE — ED Provider Notes (Signed)
[facility] Emergency Department Provider Note ____________________________________________  Time seen: 87  I have reviewed the triage vital signs and the nursing notes.  HISTORY  Chief Complaint  Wound Check  HPI [name] is a 83 y.o. male presents to the ED for evaluation of a small, shallow, wound on the right side of the abdomen, that began to drain a small amount of purulence. He notes the wound is the site of his previous percutaneous chole drain. It was removed nearly 6 weeks prior. He was seen here 3 weeks ago for a local cellulitis in the same area. He dosed bactrim, and reports it had healed fine until a day ago. He denies fevers, chills, sweats, or abdominal pain. He has been applying antibiotic ointment to the wound daily.   Past Medical History:  Diagnosis Date  . Cardiac disease   . CHF (congestive heart failure) ([place])   . Hypercholesteremia   . Hypertension   . Kidney failure    left  . Osteoporosis   . Prostate cancer ([place])   . Shingles   . Skin cancer    right cheek    Patient Active Problem List   Diagnosis Date Noted  . Gallbladder abscess 07/31/2016  . Cholecystitis 07/20/2016  . Hematuria 06/25/2016  . Sepsis ([place]) 06/25/2016  . Acute cholecystitis   . Hypokalemia 07/23/2015  . Generalized weakness 07/23/2015  . Bladder outlet obstruction 07/23/2015  . Acute delirium 07/23/2015  . Benign essential HTN 07/23/2015  . Acute on chronic renal failure ([place]) 07/20/2015  . GIB (gastrointestinal bleeding) 06/03/2015  . Kidney stone 05/25/2012  . Ureteric stone 05/25/2012  . Hydronephrosis 05/25/2012  . Chronic kidney disease, stage IV (severe) ([place]) 05/25/2012    Past Surgical History:  Procedure Laterality Date  . CARDIAC CATHETERIZATION  04/13/2014  . CT PERC CHOLECYSTOSTOMY  07/20/2016   Placed at [facility] Interventional Radiology    Prior to Admission medications   Medication Sig Start Date End Date Taking? Authorizing  Provider  aspirin EC 81 MG tablet Take 81 mg by mouth at bedtime.    [provider]  atorvastatin (LIPITOR) 40 MG tablet Take 40 mg by mouth at bedtime.     [provider]  Cholecalciferol (VITAMIN D3) 5000 units TABS Take 5,000 Units by mouth daily.    [provider]  docusate sodium (COLACE) 100 MG capsule Take 1 capsule (100 mg total) by mouth 2 (two) times daily. 07/23/16   Gladstone Lighter, MD  doxazosin (CARDURA) 4 MG tablet Take 4 mg by mouth at bedtime.     [provider]  feeding supplement (BOOST / RESOURCE BREEZE) LIQD Take 1 Container by mouth 2 (two) times daily between meals. 07/23/16   Gladstone Lighter, MD  ferrous sulfate 325 (65 FE) MG tablet Take 325 mg by mouth 2 (two) times daily.     [provider]  hydrALAZINE (APRESOLINE) 25 MG tablet Take 1 tablet by mouth 2 (two) times daily. 06/13/16   [provider]  isosorbide mononitrate (IMDUR) 30 MG 24 hr tablet Take 1 tablet by mouth daily. 06/13/16   [provider]  levothyroxine (SYNTHROID, LEVOTHROID) 100 MCG tablet Take 100 mcg by mouth daily.    [provider]  losartan (COZAAR) 100 MG tablet Take 1 tablet by mouth daily. 06/21/16   [provider]  metoprolol succinate (TOPROL-XL) 50 MG 24 hr tablet Take 50 mg by mouth daily. Take with or immediately following a meal.  [provider]  mupirocin cream (BACTROBAN) 2 % Apply to affected area 3 times daily 02/04/17   Cuthriell, Charline Bills, PA-C  nitroGLYCERIN (NITROSTAT) 0.4 MG SL tablet Place 0.4 mg under the tongue every 5 (five) minutes as needed for chest pain.    [provider]  omega-3 acid ethyl esters (LOVAZA) 1 G capsule Take 1 capsule by mouth 2 (two) times daily.    [provider]  pantoprazole (PROTONIX) 40 MG tablet Take 40 mg by mouth 2 (two) times daily.     [provider]  potassium chloride SA (K-DUR,KLOR-CON) 20 MEQ tablet Take 20 mEq by  mouth 2 (two) times daily.  08/18/15   [provider]  sodium bicarbonate 650 MG tablet Take 1,300 mg by mouth 2 (two) times daily.     [provider]  sodium chloride 0.9 % injection Inject 20 MLs into cholecystostomy drain three times weekly. 10/29/16   Clayburn Pert, MD  sulfamethoxazole-trimethoprim (BACTRIM DS,SEPTRA DS) 800-160 MG tablet Take 1 tablet by mouth 2 (two) times daily. 02/25/17   Kardell Virgil, Dannielle Karvonen, PA-C  tamsulosin (FLOMAX) 0.4 MG CAPS capsule Take 0.4 mg by mouth daily.    [provider]  torsemide (DEMADEX) 20 MG tablet Take 20 mg by mouth 2 (two) times daily.  10/27/15   [provider]    Allergies Ativan [lorazepam] and Penicillin g  Family History  Problem Relation Age of Onset  . CAD Mother   . Cancer Mother   . Heart attack Father     Social History Social History  Substance Use Topics  . Smoking status: Former Smoker    Types: Cigarettes, Cigars  . Smokeless tobacco: Current User    Types: Chew  . Alcohol use No    Review of Systems  Constitutional: Negative for fever. Eyes: Negative for visual changes. ENT: Negative for sore throat. Cardiovascular: Negative for chest pain. Respiratory: Negative for shortness of breath. Gastrointestinal: Negative for abdominal pain, vomiting and diarrhea. Genitourinary: Negative for dysuria. Musculoskeletal: Negative for back pain. Skin: Negative for rash. Neurological: Negative for headaches, focal weakness or numbness. ____________________________________________  PHYSICAL EXAM:  VITAL SIGNS: ED Triage Vitals  Enc Vitals Group     BP 02/25/17 1729 (!) 157/58     Pulse Rate 02/25/17 1729 60     Resp 02/25/17 1729 18     Temp 02/25/17 1729 97.6 F (36.4 C)     Temp Source 02/25/17 1729 Oral     SpO2 02/25/17 1729 99 %     Weight 02/25/17 1726 150 lb (68 kg)     Height 02/25/17 1726 5\' 7"  (1.702 m)     Head Circumference --      Peak Flow --      Pain Score  02/25/17 1726 4     Pain Loc --      Pain Edu? --      Excl. in [place]? --     Constitutional: Alert and oriented. Well appearing and in no distress. Head: Normocephalic and atraumatic. Cardiovascular: Normal rate, regular rhythm. Normal distal pulses. Respiratory: Normal respiratory effort. No wheezes/rales/rhonchi. Gastrointestinal: Soft and nontender. No distention. Skin:  Skin is warm, dry and intact. No rash noted. Patient with a 1 cm, shallow, erythematous superficial ulceration to the RUQ. There is no induration, warmth, or copious drainage.  ____________________________________________   LABS (pertinent positives/negatives)  Labs Reviewed  AEROBIC CULTURE (SUPERFICIAL SPECIMEN)  ____________________________________________  PROCEDURES  Bactrim DS 1 PO ____________________________________________  INITIAL IMPRESSION / ASSESSMENT AND PLAN / ED COURSE  Patient with ED evaluation of a superficial wound dehiscence and drainage. The area overall appears to have minimal amount of drainage and a very small wound bed. The wound is cultured and the patient was started empirically on Bactrim. He is advised to follow-up with Dr. Dahlia Byes or Dr. Hampton Abbot for further evaluation management. Return precautions are reviewed.  ____________________________________________  FINAL CLINICAL IMPRESSION(S) / ED DIAGNOSES  Final diagnoses:  Wound dehiscence      Desteni Piscopo, Dannielle Karvonen, PA-C 02/25/17 1823    Merlyn Lot, MD 02/25/17 2126

## 2017-02-25 NOTE — ED Triage Notes (Signed)
States an open draining area on the right side of his abd, states he has been using Bactroban on the wound but states this weekend it began to drain again, small amount of yellow drainage noted on gauze

## 2017-02-25 NOTE — ED Notes (Signed)
Pt alert and oriented X4, active, cooperative, pt in NAD. RR even and unlabored, color WNL.  Pt informed to return if any life threatening symptoms occur.   

## 2017-02-25 NOTE — Discharge Instructions (Signed)
Take the antibiotic as directed. Keep the wound clean, dry, and covered with a bandage. Follow-up with Dr. Hampton Abbot or Dr. Dahlia Byes for wound check. Return to the ED as needed.

## 2017-02-28 ENCOUNTER — Ambulatory Visit (INDEPENDENT_AMBULATORY_CARE_PROVIDER_SITE_OTHER): Payer: Medicare Other | Admitting: Surgery

## 2017-02-28 ENCOUNTER — Encounter: Payer: Self-pay | Admitting: Surgery

## 2017-02-28 VITALS — BP 147/78 | HR 61 | Temp 98.3°F | Ht 67.0 in | Wt 146.6 lb

## 2017-02-28 DIAGNOSIS — K810 Acute cholecystitis: Secondary | ICD-10-CM

## 2017-02-28 LAB — AEROBIC CULTURE  (SUPERFICIAL SPECIMEN)

## 2017-02-28 LAB — AEROBIC CULTURE W GRAM STAIN (SUPERFICIAL SPECIMEN): Special Requests: NORMAL

## 2017-02-28 NOTE — Patient Instructions (Addendum)
Please call our office if you have questions or concerns. And if the wound starts draining again please contact our office to be seen or go to the emergency room and ask for on of [facility] providers.

## 2017-02-28 NOTE — Progress Notes (Signed)
02/28/2017  HPI: Patient presents today as a follow-up from a percutaneous cholecystostomy tube drain site infection. He had presented to emergency room on 7/30 with drainage from the prior drain site. Was given a course of antibiotics and this improved on its own. He then presented again on 8/20 to the emergency room with the same issue and he was already draining and no other procedures were done at the time. Antibiotics were started as well. He continues taking antibiotics and currently has no complaints. On the visit to the emergency room, he reported he was having tenderness in the right upper quadrant at the drain site where he was having drainage. He denies having any fevers but per his wife was having some chills. Otherwise denied any nausea or vomiting. Currently he is asymptomatic and denies any tenderness or drainage from the wound.  Vital signs: BP (!) 147/78   Pulse 61   Temp 98.3 F (36.8 C) (Oral)   Ht 5\' 7"  (1.702 m)   Wt 66.5 kg (146 lb 9.6 oz)   BMI 22.96 kg/m    Physical Exam: Constitutional: No acute distress Abdomen:  Soft, nondistended, nontender to palpation. Prior drain site has already scabbed and sealed and currently there is no active drainage and cannot express any drainage manually. There is no erythema or tenderness to palpation and there is no area of fluctuance either. Dry dressing reapplied.  Assessment/Plan: 83 year old male with the small abscess at the prior percutaneous cholecystostomy tube site.  Discussed with the patient that currently it may be that he has residual fluid in the tract left behind by the percutaneous cholecystostomy tube. This fluid may be accumulating leading to an abscess formation and drainage. He's had now 2 episodes of this and although currently there is no further drainage or tenderness, he is at risk of this happening again. Discussed with the patient that if this does happen again to come back to the clinic or to the emergency room  at that point we would proceed with doing a bedside incision and drainage of the area to allow for better drainage and full evacuation of infected fluid. Currently given that he has improved there is no acute procedures are needed. The patient understands this plan and all of his cultures have been answered. He will continue taking his antibiotics that were prescribed emergency room as indicated.   Melvyn Neth, [place]

## 2017-05-16 ENCOUNTER — Inpatient Hospital Stay: Payer: Medicare Other | Attending: Radiation Oncology

## 2017-05-16 DIAGNOSIS — Z79818 Long term (current) use of other agents affecting estrogen receptors and estrogen levels: Secondary | ICD-10-CM | POA: Insufficient documentation

## 2017-05-16 DIAGNOSIS — Z923 Personal history of irradiation: Secondary | ICD-10-CM | POA: Insufficient documentation

## 2017-05-16 DIAGNOSIS — C61 Malignant neoplasm of prostate: Secondary | ICD-10-CM | POA: Diagnosis present

## 2017-05-16 LAB — PSA: Prostatic Specific Antigen: 3.99 ng/mL (ref 0.00–4.00)

## 2017-05-23 ENCOUNTER — Ambulatory Visit
Admission: RE | Admit: 2017-05-23 | Discharge: 2017-05-23 | Disposition: A | Discharge status: Home / Self Care | Payer: Medicare Other | Source: Ambulatory Visit | Attending: Radiation Oncology | Admitting: Radiation Oncology

## 2017-05-23 ENCOUNTER — Other Ambulatory Visit: Payer: Self-pay | Admitting: *Deleted

## 2017-05-23 ENCOUNTER — Encounter: Payer: Self-pay | Admitting: Radiation Oncology

## 2017-05-23 ENCOUNTER — Other Ambulatory Visit: Payer: Self-pay

## 2017-05-23 VITALS — BP 141/73 | HR 60 | Temp 97.8°F | Wt 145.9 lb

## 2017-05-23 DIAGNOSIS — Z923 Personal history of irradiation: Secondary | ICD-10-CM | POA: Insufficient documentation

## 2017-05-23 DIAGNOSIS — C61 Malignant neoplasm of prostate: Secondary | ICD-10-CM

## 2017-05-23 NOTE — Progress Notes (Signed)
Radiation Oncology Follow up Note  Name: [name]   Date:   05/23/2017 MRN:  [id number] DOB: [birth year]    This 83 y.o. male presents to the clinic today for prostate cancer with biochemical failure now on pulse Lupron therapy.  REFERRING PROVIDER: Remi Haggard, FNP  HPI: Patient is an 83 year old male treated back in 2006 for Gleason 7 adenocarcinoma the prostate. He developed biochemical recurrence in 2015 and we've been treating him with pulse Lupron therapy.Marland Kitchen His PSA 1 year prior was 7.7 with Lupron dropped 0.12 back in April 2018 and most recently last week was back to 3.9. He continues to do well specifically denies bone pain any other increased lower urinary tract symptoms or diarrhea.  COMPLICATIONS OF TREATMENT: none  FOLLOW UP COMPLIANCE: keeps appointments   PHYSICAL EXAM:  BP (!) 141/73   Pulse 60   Temp 97.8 F (36.6 C)   Wt 145 lb 15.1 oz (66.2 kg)   BMI 22.86 kg/m  Well-developed well-nourished patient in NAD. HEENT reveals PERLA, EOMI, discs not visualized.  Oral cavity is clear. No oral mucosal lesions are identified. Neck is clear without evidence of cervical or supraclavicular adenopathy. Lungs are clear to A&P. Cardiac examination is essentially unremarkable with regular rate and rhythm without murmur rub or thrill. Abdomen is benign with no organomegaly or masses noted. Motor sensory and DTR levels are equal and symmetric in the upper and lower extremities. Cranial nerves II through XII are grossly intact. Proprioception is intact. No peripheral adenopathy or edema is identified. No motor or sensory levels are noted. Crude visual fields are within normal range.  RADIOLOGY RESULTS: No current films for review  PLAN: At this time I'm going to give him another 4 month Lupron injection see him back in 6 months and repeat his PSA at that time. My plan is to continue pulse Lupron therapy. He's tolerating his treatments well without side effect or complaint.  I  would like to take this opportunity to thank you for allowing me to participate in the care of your patient.Armstead Peaks., MD

## 2017-05-24 ENCOUNTER — Other Ambulatory Visit: Payer: Self-pay | Admitting: *Deleted

## 2017-05-24 DIAGNOSIS — C61 Malignant neoplasm of prostate: Secondary | ICD-10-CM

## 2017-06-05 ENCOUNTER — Inpatient Hospital Stay: Payer: Medicare Other

## 2017-06-05 DIAGNOSIS — C61 Malignant neoplasm of prostate: Secondary | ICD-10-CM

## 2017-06-05 MED ORDER — LEUPROLIDE ACETATE (4 MONTH) 30 MG IM KIT
30.0000 mg | PACK | Freq: Once | INTRAMUSCULAR | Status: AC
  Administered 2017-06-05: 30 mg via INTRAMUSCULAR
  Filled 2017-06-05: qty 30

## 2017-11-13 ENCOUNTER — Inpatient Hospital Stay: Payer: Medicare Other | Attending: Radiation Oncology

## 2017-11-13 DIAGNOSIS — C61 Malignant neoplasm of prostate: Secondary | ICD-10-CM | POA: Insufficient documentation

## 2017-11-13 LAB — PSA: PROSTATIC SPECIFIC ANTIGEN: 0.11 ng/mL (ref 0.00–4.00)

## 2017-11-20 ENCOUNTER — Ambulatory Visit
Admission: RE | Admit: 2017-11-20 | Discharge: 2017-11-20 | Disposition: A | Discharge status: Home / Self Care | Payer: Medicare Other | Source: Ambulatory Visit | Attending: Radiation Oncology | Admitting: Radiation Oncology

## 2017-11-20 ENCOUNTER — Other Ambulatory Visit: Payer: Self-pay

## 2017-11-20 ENCOUNTER — Encounter: Payer: Self-pay | Admitting: Radiation Oncology

## 2017-11-20 VITALS — BP 108/63 | HR 67 | Temp 96.9°F | Resp 18 | Wt 144.7 lb

## 2017-11-20 DIAGNOSIS — C61 Malignant neoplasm of prostate: Secondary | ICD-10-CM

## 2017-11-20 DIAGNOSIS — Z923 Personal history of irradiation: Secondary | ICD-10-CM | POA: Insufficient documentation

## 2017-11-20 NOTE — Progress Notes (Signed)
Radiation Oncology Follow up Note  Name: [name]   Date:   11/20/2017 MRN:  [id number] DOB: [birth year]    This 84 y.o. 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  REFERRING PROVIDER: Care, [facility]  HPI: patient is an 84 year old male refill treated back in 2006 for Gleason 7 adenocarcinoma the prostate. He developed biochemical recurrence in 2015 and has been treated with pulse Lupron therapy.Back in November 2018 his PSA was 3.9 and he received a four-month Lupron at that time. His PSA 11/13/2017 was 0.11. He is doing well specifically denies diarrhea dysuria or any other GI/GU complaints. He is having no bone pain..  COMPLICATIONS OF TREATMENT: none  FOLLOW UP COMPLIANCE: keeps appointments   PHYSICAL EXAM:  BP 108/63   Pulse 67   Temp (!) 96.9 F (36.1 C)   Resp 18   Wt 144 lb 11.7 oz (65.6 kg)   BMI 22.67 kg/m  Well-developed well-nourished patient in NAD. HEENT reveals PERLA, EOMI, discs not visualized.  Oral cavity is clear. No oral mucosal lesions are identified. Neck is clear without evidence of cervical or supraclavicular adenopathy. Lungs are clear to A&P. Cardiac examination is essentially unremarkable with regular rate and rhythm without murmur rub or thrill. Abdomen is benign with no organomegaly or masses noted. Motor sensory and DTR levels are equal and symmetric in the upper and lower extremities. Cranial nerves II through XII are grossly intact. Proprioception is intact. No peripheral adenopathy or edema is identified. No motor or sensory levels are noted. Crude visual fields are within normal range.  RADIOLOGY RESULTS: no current films for review  PLAN: present time is under good biochemical control of his prostate cancer I will leave him alone at this time. I will repeat a PSA in 6 months and make further determination about further  pulse Lupron therapy at that time. Patient and wife both seem to comprehend my treatment plan well.  I would like to take this opportunity to thank you for allowing me to participate in the care of your patient.Noreene Filbert, MD

## 2018-03-08 ENCOUNTER — Emergency Department (HOSPITAL_COMMUNITY): Payer: Medicare Other

## 2018-03-08 ENCOUNTER — Inpatient Hospital Stay (HOSPITAL_COMMUNITY)
Admission: EM | Admit: 2018-03-08 | Discharge: 2018-03-10 | DRG: 071 | Disposition: A | Discharge status: Home Health | Payer: Medicare Other | Attending: Internal Medicine | Admitting: Internal Medicine

## 2018-03-08 ENCOUNTER — Encounter (HOSPITAL_COMMUNITY): Payer: Self-pay | Admitting: Emergency Medicine

## 2018-03-08 ENCOUNTER — Other Ambulatory Visit: Payer: Self-pay

## 2018-03-08 DIAGNOSIS — Z87442 Personal history of urinary calculi: Secondary | ICD-10-CM | POA: Diagnosis not present

## 2018-03-08 DIAGNOSIS — Z880 Allergy status to penicillin: Secondary | ICD-10-CM | POA: Diagnosis not present

## 2018-03-08 DIAGNOSIS — R7989 Other specified abnormal findings of blood chemistry: Secondary | ICD-10-CM

## 2018-03-08 DIAGNOSIS — R748 Abnormal levels of other serum enzymes: Secondary | ICD-10-CM | POA: Diagnosis not present

## 2018-03-08 DIAGNOSIS — K449 Diaphragmatic hernia without obstruction or gangrene: Secondary | ICD-10-CM | POA: Diagnosis present

## 2018-03-08 DIAGNOSIS — E039 Hypothyroidism, unspecified: Secondary | ICD-10-CM | POA: Diagnosis present

## 2018-03-08 DIAGNOSIS — I4891 Unspecified atrial fibrillation: Secondary | ICD-10-CM | POA: Diagnosis present

## 2018-03-08 DIAGNOSIS — Z955 Presence of coronary angioplasty implant and graft: Secondary | ICD-10-CM

## 2018-03-08 DIAGNOSIS — K869 Disease of pancreas, unspecified: Secondary | ICD-10-CM | POA: Diagnosis present

## 2018-03-08 DIAGNOSIS — Z87891 Personal history of nicotine dependence: Secondary | ICD-10-CM

## 2018-03-08 DIAGNOSIS — Z85828 Personal history of other malignant neoplasm of skin: Secondary | ICD-10-CM

## 2018-03-08 DIAGNOSIS — Z8546 Personal history of malignant neoplasm of prostate: Secondary | ICD-10-CM | POA: Diagnosis not present

## 2018-03-08 DIAGNOSIS — R159 Full incontinence of feces: Secondary | ICD-10-CM | POA: Diagnosis not present

## 2018-03-08 DIAGNOSIS — Z808 Family history of malignant neoplasm of other organs or systems: Secondary | ICD-10-CM

## 2018-03-08 DIAGNOSIS — N200 Calculus of kidney: Secondary | ICD-10-CM

## 2018-03-08 DIAGNOSIS — K5790 Diverticulosis of intestine, part unspecified, without perforation or abscess without bleeding: Secondary | ICD-10-CM | POA: Diagnosis not present

## 2018-03-08 DIAGNOSIS — Z8249 Family history of ischemic heart disease and other diseases of the circulatory system: Secondary | ICD-10-CM

## 2018-03-08 DIAGNOSIS — I5032 Chronic diastolic (congestive) heart failure: Secondary | ICD-10-CM | POA: Diagnosis present

## 2018-03-08 DIAGNOSIS — F0390 Unspecified dementia without behavioral disturbance: Secondary | ICD-10-CM | POA: Diagnosis present

## 2018-03-08 DIAGNOSIS — R531 Weakness: Secondary | ICD-10-CM | POA: Diagnosis present

## 2018-03-08 DIAGNOSIS — N184 Chronic kidney disease, stage 4 (severe): Secondary | ICD-10-CM | POA: Diagnosis present

## 2018-03-08 DIAGNOSIS — K8010 Calculus of gallbladder with chronic cholecystitis without obstruction: Secondary | ICD-10-CM | POA: Diagnosis present

## 2018-03-08 DIAGNOSIS — K5730 Diverticulosis of large intestine without perforation or abscess without bleeding: Secondary | ICD-10-CM | POA: Diagnosis present

## 2018-03-08 DIAGNOSIS — G9340 Encephalopathy, unspecified: Secondary | ICD-10-CM | POA: Diagnosis present

## 2018-03-08 DIAGNOSIS — Z7982 Long term (current) use of aspirin: Secondary | ICD-10-CM | POA: Diagnosis not present

## 2018-03-08 DIAGNOSIS — E785 Hyperlipidemia, unspecified: Secondary | ICD-10-CM | POA: Diagnosis present

## 2018-03-08 DIAGNOSIS — E7800 Pure hypercholesterolemia, unspecified: Secondary | ICD-10-CM | POA: Diagnosis present

## 2018-03-08 DIAGNOSIS — I2510 Atherosclerotic heart disease of native coronary artery without angina pectoris: Secondary | ICD-10-CM | POA: Diagnosis present

## 2018-03-08 DIAGNOSIS — Z79890 Hormone replacement therapy (postmenopausal): Secondary | ICD-10-CM

## 2018-03-08 DIAGNOSIS — I130 Hypertensive heart and chronic kidney disease with heart failure and stage 1 through stage 4 chronic kidney disease, or unspecified chronic kidney disease: Secondary | ICD-10-CM | POA: Diagnosis present

## 2018-03-08 DIAGNOSIS — R410 Disorientation, unspecified: Secondary | ICD-10-CM | POA: Diagnosis not present

## 2018-03-08 DIAGNOSIS — I700 Atherosclerosis of aorta: Secondary | ICD-10-CM | POA: Diagnosis present

## 2018-03-08 DIAGNOSIS — N132 Hydronephrosis with renal and ureteral calculous obstruction: Secondary | ICD-10-CM

## 2018-03-08 DIAGNOSIS — M810 Age-related osteoporosis without current pathological fracture: Secondary | ICD-10-CM | POA: Diagnosis present

## 2018-03-08 DIAGNOSIS — I340 Nonrheumatic mitral (valve) insufficiency: Secondary | ICD-10-CM | POA: Diagnosis not present

## 2018-03-08 DIAGNOSIS — R740 Nonspecific elevation of levels of transaminase and lactic acid dehydrogenase [LDH]: Secondary | ICD-10-CM | POA: Diagnosis not present

## 2018-03-08 DIAGNOSIS — I493 Ventricular premature depolarization: Secondary | ICD-10-CM | POA: Diagnosis present

## 2018-03-08 DIAGNOSIS — Z888 Allergy status to other drugs, medicaments and biological substances status: Secondary | ICD-10-CM | POA: Diagnosis not present

## 2018-03-08 DIAGNOSIS — R32 Unspecified urinary incontinence: Secondary | ICD-10-CM | POA: Diagnosis not present

## 2018-03-08 DIAGNOSIS — H548 Legal blindness, as defined in USA: Secondary | ICD-10-CM | POA: Diagnosis present

## 2018-03-08 DIAGNOSIS — R945 Abnormal results of liver function studies: Secondary | ICD-10-CM

## 2018-03-08 LAB — BASIC METABOLIC PANEL
Anion gap: 8 (ref 5–15)
BUN: 23 mg/dL (ref 8–23)
CALCIUM: 8.2 mg/dL — AB (ref 8.9–10.3)
CO2: 21 mmol/L — ABNORMAL LOW (ref 22–32)
CREATININE: 2.69 mg/dL — AB (ref 0.61–1.24)
Chloride: 107 mmol/L (ref 98–111)
GFR, EST AFRICAN AMERICAN: 23 mL/min — AB (ref >60)
GFR, EST NON AFRICAN AMERICAN: 20 mL/min — AB (ref >60)
Glucose, Bld: 126 mg/dL — ABNORMAL HIGH (ref 70–99)
Potassium: 3.6 mmol/L (ref 3.5–5.1)
SODIUM: 136 mmol/L (ref 135–145)

## 2018-03-08 LAB — PROTIME-INR
INR: 1.22
PROTHROMBIN TIME: 15.3 s — AB (ref 11.4–15.2)

## 2018-03-08 LAB — COMPREHENSIVE METABOLIC PANEL
ALBUMIN: 3.1 g/dL — AB (ref 3.5–5.0)
ALK PHOS: 161 U/L — AB (ref 38–126)
ALT: 629 U/L — AB (ref 0–44)
ANION GAP: 16 — AB (ref 5–15)
AST: 753 U/L — AB (ref 15–41)
BILIRUBIN TOTAL: 4.2 mg/dL — AB (ref 0.3–1.2)
BUN: 23 mg/dL (ref 8–23)
CALCIUM: 8.5 mg/dL — AB (ref 8.9–10.3)
CO2: 17 mmol/L — ABNORMAL LOW (ref 22–32)
Chloride: 102 mmol/L (ref 98–111)
Creatinine, Ser: 2.85 mg/dL — ABNORMAL HIGH (ref 0.61–1.24)
GFR calc Af Amer: 21 mL/min — ABNORMAL LOW (ref >60)
GFR, EST NON AFRICAN AMERICAN: 18 mL/min — AB (ref >60)
Glucose, Bld: 162 mg/dL — ABNORMAL HIGH (ref 70–99)
Potassium: 3.8 mmol/L (ref 3.5–5.1)
SODIUM: 135 mmol/L (ref 135–145)
TOTAL PROTEIN: 5.5 g/dL — AB (ref 6.5–8.1)

## 2018-03-08 LAB — CBC WITH DIFFERENTIAL/PLATELET
BASOS PCT: 0 %
Basophils Absolute: 0 10*3/uL (ref 0.0–0.1)
EOS PCT: 3 %
Eosinophils Absolute: 0.2 10*3/uL (ref 0.0–0.7)
HEMATOCRIT: 35.3 % — AB (ref 39.0–52.0)
HEMOGLOBIN: 11.3 g/dL — AB (ref 13.0–17.0)
LYMPHS PCT: 4 %
Lymphs Abs: 0.2 10*3/uL — ABNORMAL LOW (ref 0.7–4.0)
MCH: 30.5 pg (ref 26.0–34.0)
MCHC: 32 g/dL (ref 30.0–36.0)
MCV: 95.4 fL (ref 78.0–100.0)
Monocytes Absolute: 0.3 10*3/uL (ref 0.1–1.0)
Monocytes Relative: 5 %
NEUTROS ABS: 4.5 10*3/uL (ref 1.7–7.7)
NEUTROS PCT: 88 %
Platelets: 96 10*3/uL — ABNORMAL LOW (ref 150–400)
RBC: 3.7 MIL/uL — ABNORMAL LOW (ref 4.22–5.81)
RDW: 13.8 % (ref 11.5–15.5)
WBC: 5.2 10*3/uL (ref 4.0–10.5)

## 2018-03-08 LAB — URINALYSIS, ROUTINE W REFLEX MICROSCOPIC
Bacteria, UA: NONE SEEN
Bilirubin Urine: NEGATIVE
Ketones, ur: 20 mg/dL — AB
Leukocytes, UA: NEGATIVE
Nitrite: NEGATIVE
PH: 7 (ref 5.0–8.0)
Protein, ur: 30 mg/dL — AB
SPECIFIC GRAVITY, URINE: 1.01 (ref 1.005–1.030)

## 2018-03-08 LAB — CBG MONITORING, ED: Glucose-Capillary: 146 mg/dL — ABNORMAL HIGH (ref 70–99)

## 2018-03-08 LAB — PSA: PROSTATIC SPECIFIC ANTIGEN: 2.37 ng/mL (ref 0.00–4.00)

## 2018-03-08 LAB — I-STAT CG4 LACTIC ACID, ED: LACTIC ACID, VENOUS: 1.43 mmol/L (ref 0.5–1.9)

## 2018-03-08 LAB — LIPASE, BLOOD: LIPASE: 27 U/L (ref 11–51)

## 2018-03-08 LAB — TSH: TSH: 0.3 u[IU]/mL — AB (ref 0.350–4.500)

## 2018-03-08 MED ORDER — ISOSORBIDE MONONITRATE ER 30 MG PO TB24
30.0000 mg | ORAL_TABLET | Freq: Every day | ORAL | Status: DC
  Administered 2018-03-08 – 2018-03-10 (×3): 30 mg via ORAL
  Filled 2018-03-08 (×3): qty 1

## 2018-03-08 MED ORDER — HEPARIN SODIUM (PORCINE) 5000 UNIT/ML IJ SOLN: 5000.0000 [IU] | Freq: Three times a day (TID) | INTRAMUSCULAR | Status: DC

## 2018-03-08 MED ORDER — SODIUM BICARBONATE 650 MG PO TABS
1300.0000 mg | ORAL_TABLET | Freq: Two times a day (BID) | ORAL | Status: DC
  Administered 2018-03-08 – 2018-03-10 (×4): 1300 mg via ORAL
  Filled 2018-03-08 (×4): qty 2

## 2018-03-08 MED ORDER — TORSEMIDE 20 MG PO TABS
20.0000 mg | ORAL_TABLET | Freq: Two times a day (BID) | ORAL | Status: DC
  Administered 2018-03-08 – 2018-03-10 (×5): 20 mg via ORAL
  Filled 2018-03-08 (×5): qty 1

## 2018-03-08 MED ORDER — HEPARIN SODIUM (PORCINE) 5000 UNIT/ML IJ SOLN
5000.0000 [IU] | Freq: Three times a day (TID) | INTRAMUSCULAR | Status: DC
  Administered 2018-03-08 – 2018-03-10 (×6): 5000 [IU] via SUBCUTANEOUS
  Filled 2018-03-08 (×5): qty 1

## 2018-03-08 MED ORDER — LEVOTHYROXINE SODIUM 100 MCG PO TABS
100.0000 ug | ORAL_TABLET | Freq: Every day | ORAL | Status: DC
  Administered 2018-03-09 – 2018-03-10 (×2): 100 ug via ORAL
  Filled 2018-03-08 (×2): qty 1

## 2018-03-08 MED ORDER — TAMSULOSIN HCL 0.4 MG PO CAPS
0.4000 mg | ORAL_CAPSULE | Freq: Every day | ORAL | Status: DC
  Administered 2018-03-08 – 2018-03-10 (×3): 0.4 mg via ORAL
  Filled 2018-03-08 (×3): qty 1

## 2018-03-08 MED ORDER — HYDRALAZINE HCL 25 MG PO TABS
25.0000 mg | ORAL_TABLET | Freq: Two times a day (BID) | ORAL | Status: DC
  Administered 2018-03-08 – 2018-03-10 (×4): 25 mg via ORAL
  Filled 2018-03-08 (×4): qty 1

## 2018-03-08 MED ORDER — METOPROLOL SUCCINATE ER 25 MG PO TB24
50.0000 mg | ORAL_TABLET | Freq: Every day | ORAL | Status: DC
  Administered 2018-03-08 – 2018-03-10 (×3): 50 mg via ORAL
  Filled 2018-03-08 (×3): qty 2

## 2018-03-08 MED ORDER — PANTOPRAZOLE SODIUM 40 MG PO TBEC
40.0000 mg | DELAYED_RELEASE_TABLET | Freq: Two times a day (BID) | ORAL | Status: DC
  Administered 2018-03-08 – 2018-03-10 (×4): 40 mg via ORAL
  Filled 2018-03-08 (×5): qty 1

## 2018-03-08 MED ORDER — POTASSIUM CHLORIDE CRYS ER 20 MEQ PO TBCR
20.0000 meq | EXTENDED_RELEASE_TABLET | Freq: Two times a day (BID) | ORAL | Status: DC
  Administered 2018-03-08 – 2018-03-10 (×4): 20 meq via ORAL
  Filled 2018-03-08 (×4): qty 1

## 2018-03-08 MED ORDER — ADULT MULTIVITAMIN W/MINERALS CH
1.0000 | ORAL_TABLET | Freq: Every day | ORAL | Status: DC
  Administered 2018-03-09 – 2018-03-10 (×2): 1 via ORAL
  Filled 2018-03-08 (×2): qty 1

## 2018-03-08 NOTE — ED Provider Notes (Signed)
[facility] EMERGENCY DEPARTMENT Provider Note   CSN: [id number] Arrival date & time: 03/08/18  0230     History   Chief Complaint Chief Complaint  Patient presents with  . Weakness    HPI [name] is a 84 y.o. male.  This patient is an 84 year old male with past medical history of CHF, hypertension.  He presents today for evaluation of weakness.  He lives at home with his wife who called because the patient was having trouble ambulating and seemed very weak.  There was a reported episode of vomiting and diarrhea this afternoon.  He denies abdominal pain.  He denies fevers or chills.  He is somewhat of a difficult historian secondary to dementia.  The history is provided by the patient and the EMS personnel.  Weakness  Primary symptoms include no focal weakness. This is a new problem. The current episode started 2 days ago. The problem has been gradually worsening. There was no focality noted. There has been no fever. Associated symptoms include vomiting. Pertinent negatives include no shortness of breath and no chest pain.    Past Medical History:  Diagnosis Date  . Cardiac disease   . CHF (congestive heart failure) ([place])   . Hypercholesteremia   . Hypertension   . Kidney failure    left  . Osteoporosis   . Prostate cancer ([place])   . Shingles   . Skin cancer    right cheek    Patient Active Problem List   Diagnosis Date Noted  . Gallbladder abscess 07/31/2016  . Cholecystitis 07/20/2016  . Hematuria 06/25/2016  . Sepsis ([place]) 06/25/2016  . Acute cholecystitis   . Hypokalemia 07/23/2015  . Generalized weakness 07/23/2015  . Bladder outlet obstruction 07/23/2015  . Acute delirium 07/23/2015  . Benign essential HTN 07/23/2015  . Acute on chronic renal failure ([place]) 07/20/2015  . GIB (gastrointestinal bleeding) 06/03/2015  . Kidney stone 05/25/2012  . Ureteric stone 05/25/2012  . Hydronephrosis 05/25/2012  . Chronic kidney disease, stage IV  (severe) ([place]) 05/25/2012    Past Surgical History:  Procedure Laterality Date  . CARDIAC CATHETERIZATION  04/13/2014  . CT PERC CHOLECYSTOSTOMY  07/20/2016   Placed at [place] Medications    Prior to Admission medications   Medication Sig Start Date End Date Taking? Authorizing Provider  aspirin EC 81 MG tablet Take 81 mg by mouth at bedtime.    [provider]  atorvastatin (LIPITOR) 40 MG tablet Take 40 mg by mouth at bedtime.     [provider]  Cholecalciferol (VITAMIN D3) 5000 units TABS Take 5,000 Units by mouth daily.    [provider]  docusate sodium (COLACE) 100 MG capsule Take 1 capsule (100 mg total) by mouth 2 (two) times daily. 07/23/16   Gladstone Lighter, MD  doxazosin (CARDURA) 4 MG tablet Take 4 mg by mouth at bedtime.     [provider]  feeding supplement (BOOST / RESOURCE BREEZE) LIQD Take 1 Container by mouth 2 (two) times daily between meals. 07/23/16   Gladstone Lighter, MD  ferrous sulfate 325 (65 FE) MG tablet Take 325 mg by mouth 2 (two) times daily.     [provider]  hydrALAZINE (APRESOLINE) 25 MG tablet Take 1 tablet by mouth 2 (two) times daily. 06/13/16   [provider]  isosorbide mononitrate (IMDUR) 30 MG 24 hr tablet Take 1 tablet by mouth daily. 06/13/16  [provider]  levothyroxine (SYNTHROID, LEVOTHROID) 100 MCG tablet Take 100 mcg by mouth daily.    [provider]  losartan (COZAAR) 100 MG tablet Take 1 tablet by mouth daily. 06/21/16   [provider]  metoprolol succinate (TOPROL-XL) 50 MG 24 hr tablet Take 50 mg by mouth daily. Take with or immediately following a meal.    [provider]  mupirocin cream (BACTROBAN) 2 % Apply to affected area 3 times daily Patient not taking: Reported on 11/20/2017 02/04/17   Cuthriell, Charline Bills, PA-C  nitroGLYCERIN (NITROSTAT) 0.4 MG SL tablet Place 0.4 mg under the tongue every  5 (five) minutes as needed for chest pain.    [provider]  omega-3 acid ethyl esters (LOVAZA) 1 G capsule Take 1 capsule by mouth 2 (two) times daily.    [provider]  pantoprazole (PROTONIX) 40 MG tablet Take 40 mg by mouth 2 (two) times daily.     [provider]  potassium chloride SA (K-DUR,KLOR-CON) 20 MEQ tablet Take 20 mEq by mouth 2 (two) times daily.  08/18/15   [provider]  sodium bicarbonate 650 MG tablet Take 1,300 mg by mouth 2 (two) times daily.     [provider]  sodium chloride 0.9 % injection Inject 20 MLs into cholecystostomy drain three times weekly. Patient not taking: Reported on 11/20/2017 10/29/16   Clayburn Pert, MD  sulfamethoxazole-trimethoprim (BACTRIM DS,SEPTRA DS) 800-160 MG tablet Take 1 tablet by mouth 2 (two) times daily. Patient not taking: Reported on 11/20/2017 02/25/17   Menshew, Dannielle Karvonen, PA-C  tamsulosin (FLOMAX) 0.4 MG CAPS capsule Take 0.4 mg by mouth daily.    [provider]  torsemide (DEMADEX) 20 MG tablet Take 20 mg by mouth 2 (two) times daily.  10/27/15   [provider]    Family History Family History  Problem Relation Age of Onset  . CAD Mother   . Cancer Mother   . Heart attack Father     Social History Social History   Tobacco Use  . Smoking status: Former Smoker    Types: Cigarettes, Cigars  . Smokeless tobacco: Current User    Types: Chew  Substance Use Topics  . Alcohol use: No    Alcohol/week: 0.0 standard drinks  . Drug use: No     Allergies   Ativan [lorazepam] and Penicillin g   Review of Systems Review of Systems  Respiratory: Negative for shortness of breath.   Cardiovascular: Negative for chest pain.  Gastrointestinal: Positive for vomiting.  Neurological: Positive for weakness. Negative for focal weakness.  All other systems reviewed and are negative.    Physical Exam Updated Vital Signs BP 125/65 (BP Location: Right Arm)    Pulse 69   Temp 98.7 F (37.1 C) (Oral)   Resp 19   Ht 5\' 7"  (1.702 m)   Wt 65.6 kg   SpO2 98%   BMI 22.65 kg/m   Physical Exam  Constitutional: He is oriented to person, place, and time. He appears well-developed and well-nourished. No distress.  Patient is a somewhat pale and frail-appearing elderly male in no acute distress.  HENT:  Head: Normocephalic and atraumatic.  Mouth/Throat: Oropharynx is clear and moist.  Neck: Normal range of motion. Neck supple.  Cardiovascular: Normal rate and regular rhythm. Exam reveals no friction rub.  No murmur heard. Pulmonary/Chest: Effort normal and breath sounds normal. No respiratory distress. He has no wheezes. He has no rales.  Abdominal: Soft.  Bowel sounds are normal. He exhibits no distension. There is no tenderness.  Musculoskeletal: Normal range of motion. He exhibits no edema.  Neurological: He is alert and oriented to person, place, and time. Coordination normal.  Skin: Skin is warm and dry. He is not diaphoretic.  Nursing note and vitals reviewed.    ED Treatments / Results  Labs (all labs ordered are listed, but only abnormal results are displayed) Labs Reviewed  COMPREHENSIVE METABOLIC PANEL - Abnormal; Notable for the following components:      Result Value   CO2 17 (*)    Glucose, Bld 162 (*)    Creatinine, Ser 2.85 (*)    Calcium 8.5 (*)    Total Protein 5.5 (*)    Albumin 3.1 (*)    AST 753 (*)    ALT 629 (*)    Alkaline Phosphatase 161 (*)    Total Bilirubin 4.2 (*)    GFR calc non Af Amer 18 (*)    GFR calc Af Amer 21 (*)    Anion gap 16 (*)    All other components within normal limits  CBC WITH DIFFERENTIAL/PLATELET - Abnormal; Notable for the following components:   RBC 3.70 (*)    Hemoglobin 11.3 (*)    HCT 35.3 (*)    All other components within normal limits  CBG MONITORING, ED - Abnormal; Notable for the following components:   Glucose-Capillary 146 (*)    All other components within normal limits    URINALYSIS, ROUTINE W REFLEX MICROSCOPIC  I-STAT CG4 LACTIC ACID, ED    EKG EKG Interpretation  Date/Time:  Saturday March 08 2018 02:39:03 EDT Ventricular Rate:  75 PR Interval:    QRS Duration: 156 QT Interval:  450 QTC Calculation: 503 R Axis:   23 Text Interpretation:  Sinus rhythm Right bundle branch block Confirmed by Veryl Speak [phone]) on 03/08/2018 3:47:57 AM   Radiology Dg Chest 2 View  Result Date: 03/08/2018 CLINICAL DATA:  Generalized weakness.  Nausea and vomiting. EXAM: CHEST - 2 VIEW COMPARISON:  Chest radiograph July 22, 2016 FINDINGS: Cardiac silhouette is moderately enlarged. Coronary artery calcification versus stent. Large hiatal hernia. Improved aeration lungs. Hyperinflation without pleural effusion or focal consolidation. No pneumothorax. Osteopenia. Mild chronic appearing midthoracic compression fracture. IMPRESSION: 1. Cardiomegaly.  Hyperinflation without acute pulmonary process. 2. Large hiatal hernia. Electronically Signed   By: Elon Alas M.D.   On: 03/08/2018 03:49    Procedures Procedures (including critical care time)  Medications Ordered in ED Medications - No data to display   Initial Impression / Assessment and Plan / ED Course  I have reviewed the triage vital signs and the nursing notes.  Pertinent labs & imaging results that were available during my care of the patient were reviewed by me and considered in my medical decision making (see chart for details).  Patient presenting with weakness and nausea.  His work-up reveals elevated transaminases and bilirubin, the etiology of which I am uncertain.  CT scan shows no gallstones.  The patient has had a prior cholecystostomy tube, however this was removed many months ago.  I am unsure as to how this fits the clinical picture.  Work-up is otherwise essentially unremarkable.  He will be admitted to the internal medicine teaching service.  Final Clinical Impressions(s) / ED  Diagnoses   Final diagnoses:  None    ED Discharge Orders    None       Veryl Speak, MD 03/08/18 [phone]

## 2018-03-08 NOTE — ED Triage Notes (Signed)
Pt BIB EMS from home with reported new onset generalized weakness. Per EMS, family report that pt is not himself, as he is normally able to ambulate and get around on his own, but now too weak to do so. EMS noted strong urine smell. Family also report N/V with single episode of emesis. Pt denies CP, SOB, N/V/D upon arrival in ED. Pt mildly confused as not oriented to time or situation.

## 2018-03-08 NOTE — ED Notes (Signed)
Patient transported to CT 

## 2018-03-08 NOTE — H&P (Signed)
Date: 03/08/2018               Patient Name:  [name] MRN: [id number]  DOB: June 23, 1927 Age / Sex: 84 y.o., male   PCP: Care, [place] Service: Internal Medicine Teaching Service         Attending Physician: Dr. Oval Linsey, MD    First Contact: Dr. Sherry Ruffing Pager: [phone]  Second Contact: Dr. Berline Lopes  Pager: [phone]       After Hours (After 5p/  First Contact Pager: [phone]  weekends / holidays): Second Contact Pager: [phone]   Chief Complaint: Weakness  History of Present Illness: This is a 84 year old male with a PMH of CAD (s/p stent), diastolic CHF(Echo , prostate cancer (on lupron therapy), atrial fibrillation, HLD, HTN who presented from home with weakness that started yesterday. Family was present at bedside. They reported that he was at home when this started, he was staring off into space but was responding to questions, he appeared very dazed and disoriented at that time. They reported that he was having labored breathing and started rocking back and forth, this lasted for about 30 minutes and they decided to call the ambulance. By the time EMS arrived he had some improvement. He did not seem to have a LOC, did not fall, and had no bowel or urine incontinence. He normally does not need anything to help move around but was needing to lean against the wall yesterday to move around. Reports an episode of nausea and non-bilious non-bloody vomiting, was a clear liquid. He denies any back pain, fevers, chills, headaches.  He denied any recent changes in medications, travel history, recent diet changes.   He reports that he has been having worsening of his eye sight in the past 4 weeks, is unable to read anything. He lost his license about 5 years ago. He has not seen an opthomalogist for this.  In the ED he was afebrile, RR of 20, Sat O2 at 100%, HR 74, BP 125/65. U/A showed glucose >500, ketones 20, and protein of 30. Glucose was 146. LA 1.43.  CMP showed Na 135, K 3.8, Cl 102, CO2 17, glucose 162, BUN 23, Cr 2.85(baseline around 3), protein 5.5, albumin 3.1, AST 753, ALT 629, Alk 161, T bili 4.2, anion gap of 16. CBC showed a Hgb 11.3 (near baseline), and Plt 96. Abdominal CT showed small left pleural effusion and mild basilar atelectasis, left renal atrophy and chronic left hydronephrosis, non-obstructing stones in both kidneys, diverticulosis, and a large hiatal hernia (unchanged). CXR showed cardiomegaly, hyperinflation, and large hiatal hernia. Patient was admitted to internal medicine.   Meds:  Scheduled Meds: . heparin  5,000 Units Subcutaneous Q8H  . hydrALAZINE  25 mg Oral BID  . isosorbide mononitrate  30 mg Oral Daily  . [START ON 03/09/2018] levothyroxine  100 mcg Oral Daily  . metoprolol succinate  50 mg Oral Daily  . multivitamin with minerals  1 tablet Oral Daily  . pantoprazole  40 mg Oral BID  . potassium chloride SA  20 mEq Oral BID  . sodium bicarbonate  1,300 mg Oral BID  . tamsulosin  0.4 mg Oral Daily  . torsemide  20 mg Oral BID   Continuous Infusions: PRN Meds:.  No outpatient medications have been marked as taking for the 03/08/18 encounter [facility] Encounter).     Allergies: Allergies as of 03/08/2018 - Review Complete 03/08/2018  Allergen Reaction Noted  . Ativan [lorazepam] Other (See Comments) 06/05/2015  . Penicillin g Other (See Comments) 10/25/2014   Past Medical History:  Diagnosis Date  . Cardiac disease   . CHF (congestive heart failure) ([place])   . Hypercholesteremia   . Hypertension   . Kidney failure    left  . Osteoporosis   . Prostate cancer ([place])   . Shingles   . Skin cancer    right cheek    Family History: Mother died at 33 from throat cancer, father had CAD  Social History: Quit smoking when he was a teenager, still chews tobacco. Quit drinking either 5-10 years ago, reported that he drank pretty heavily before this. Denies other drug use. Lives at home with his wife,  worked at [employer], retired at age 27, lived in [place] his whole life.   Review of Systems: A complete ROS was negative except as per HPI.   Physical Exam: Blood pressure (!) 147/66, pulse (!) 56, temperature 97.7 F (36.5 C), temperature source Axillary, resp. rate 18, height '5\' 7"'  (1.702 m), weight 65.6 kg, SpO2 98 %. General: alert, cooperative, appears stated age and no distress HEENT: PERRLA and extra ocular movement intact, left neck mass that is soft, movable, non-tender Heart: Irregular rhythm, S1 and S2 normal, no murmurs Lungs: clear to auscultation, no wheezes or rales and unlabored breathing Abdomen: Mild distension, tenderness to palpation in left mid-abdomen, non-tender in other areas, no CVA tenderness, negative murphys sign Extremities: extremities normal, atraumatic, no cyanosis or edema Musculoskeletal: no joint tenderness, deformity or swelling Skin:no rashes, no wounds Neurology: normal mental status, speech normal, PERRLA, 5/5 strength in upper and lower extremities, reflexes normal and symmetric Psychiatry: Normal mood and affect   EKG: personally reviewed my interpretation is HR 75, sinus rhythm, no PVCs, no ST changes  CXR: personally reviewed my interpretation is hyperinflated lung fields, flattening of diaphragm, cardiomegaly, no evidence of atelectasis or edema   CT abdomen/ pelvis  IMPRESSION: No acute abnormality abdomen or pelvis.  Small left pleural effusion and mild basilar atelectasis.  Severe left renal atrophy and chronic left hydronephrosis due to a distal left ureteral stone. Single nonobstructing stones in both kidneys are unchanged.  Atherosclerosis.  Diverticulosis without diverticulitis.  Large hiatal hernia, unchanged  Assessment & Plan by Problem:  Active Problems:   Acute encephalopathy This is a 84 year old male with a PMH of diastolic CHF,atrial fibrillation, prostate cancer (s/p lupron therapy), and HTN who presented from  home with generalized weakness, bowel and bladder incontinence. Found to have elevated liver enzymes.    Acute encephalopathy: -Patient presented with an episode of generalized weakness that started yesterday when he was at home, he has been having progressive weakness over the last couple of weeks. Family reported that he was very dazed and disoriented in the morning. On his neurological exam his strength was 5/5 and symmetric, reflexes normal and symmetric. He reports that he is still feeling very weak today. -He also has a history of hypothyroidism and is on synthoid for this, he reports that he does not know what medications he takes because he can't see well but his wife helps him with this. It's possible that there may be a medication non-compliance issue in play, will check a TSH  -Other possible causes of his sudden onset of weakness could be dehydration 2/2 a gastroenteritis, he reported some nausea and emesis yesterday, will get orthostatics to assess for this -Also consider an arrhythmia, he  does have a history of atrial fibrillation so this could be cardiac in nature, with some decreased cardiac output resulting in some mild hypoxia -History is not consistent with a seizure, family reported that he was rocking back and forth but he did not have LOC, loss of bowel or bladder function, and was able to talk while this was occurring. -Telemetry has been showing multiple PVCs, will continue this for now -Orthostatics -Telemetry -Echocardiogram -TSH -PSA  Transaminitis -AST 753, ALT 629, Alk 161, T bili 4.2 -Patient has a history of acute gangrenous cholecystitis in 2018, had a drain placed and given antibiotics, discharged with both drains.  -Patient did not have any RUQ abdominal pain on exam, unlikely to be a gallbladder etiology -Patient denied any recent EtOH use, change in medications, travel history or change in diet -CT scan did not show any acute findings in the liver or gall  bladder -This could be related to an acute hepatitis infection however we would expect to see the ASTs and ALTs to be more elevated, other possible causes are medication induced  -Lipase was normal -PT/INR -Hepatitis panel, HCV, HBV -CMP  CAD s/p stent -Followed by Dr. Humphrey Rolls -Continue home medications  Prostate cancer: -Treated in 2006 for gleason 7 adenocarcinoma of the prostate, recurrence in 2015, treated with pulse lupron therapy. Last PSA was in 5/19 it was 0.11.  -Follows with Dr. Baruch Gouty  FEN: No fluids, replete lytes prn, NPO VTE ppx: Lovenox  Code Status: FULL    Dispo: Admit patient to Inpatient with expected length of stay greater than 2 midnights.  Signed: Asencion Noble, MD 03/08/2018, 3:29 PM  Pager: [phone]

## 2018-03-08 NOTE — Progress Notes (Signed)
Patient arrived to 3W28 accompanied by family. Safety precautions and orders reviewed with patient/family. TELE applied and confirmed. VSS. MD paged. RN awaiting on orders.   Ave Filter, RN

## 2018-03-09 ENCOUNTER — Inpatient Hospital Stay (HOSPITAL_COMMUNITY): Payer: Medicare Other

## 2018-03-09 DIAGNOSIS — I5032 Chronic diastolic (congestive) heart failure: Secondary | ICD-10-CM

## 2018-03-09 DIAGNOSIS — E039 Hypothyroidism, unspecified: Secondary | ICD-10-CM

## 2018-03-09 DIAGNOSIS — R740 Nonspecific elevation of levels of transaminase and lactic acid dehydrogenase [LDH]: Secondary | ICD-10-CM

## 2018-03-09 DIAGNOSIS — F17220 Nicotine dependence, chewing tobacco, uncomplicated: Secondary | ICD-10-CM

## 2018-03-09 DIAGNOSIS — R945 Abnormal results of liver function studies: Secondary | ICD-10-CM

## 2018-03-09 DIAGNOSIS — I4510 Unspecified right bundle-branch block: Secondary | ICD-10-CM

## 2018-03-09 DIAGNOSIS — I700 Atherosclerosis of aorta: Secondary | ICD-10-CM

## 2018-03-09 DIAGNOSIS — R7989 Other specified abnormal findings of blood chemistry: Secondary | ICD-10-CM

## 2018-03-09 DIAGNOSIS — Z79890 Hormone replacement therapy (postmenopausal): Secondary | ICD-10-CM

## 2018-03-09 DIAGNOSIS — Z8546 Personal history of malignant neoplasm of prostate: Secondary | ICD-10-CM

## 2018-03-09 DIAGNOSIS — N184 Chronic kidney disease, stage 4 (severe): Secondary | ICD-10-CM

## 2018-03-09 DIAGNOSIS — R531 Weakness: Secondary | ICD-10-CM

## 2018-03-09 DIAGNOSIS — N200 Calculus of kidney: Secondary | ICD-10-CM

## 2018-03-09 DIAGNOSIS — R159 Full incontinence of feces: Secondary | ICD-10-CM

## 2018-03-09 DIAGNOSIS — I2510 Atherosclerotic heart disease of native coronary artery without angina pectoris: Secondary | ICD-10-CM

## 2018-03-09 DIAGNOSIS — I4891 Unspecified atrial fibrillation: Secondary | ICD-10-CM

## 2018-03-09 DIAGNOSIS — Z8719 Personal history of other diseases of the digestive system: Secondary | ICD-10-CM

## 2018-03-09 DIAGNOSIS — N132 Hydronephrosis with renal and ureteral calculous obstruction: Secondary | ICD-10-CM

## 2018-03-09 DIAGNOSIS — K5790 Diverticulosis of intestine, part unspecified, without perforation or abscess without bleeding: Secondary | ICD-10-CM

## 2018-03-09 DIAGNOSIS — E785 Hyperlipidemia, unspecified: Secondary | ICD-10-CM

## 2018-03-09 DIAGNOSIS — M4854XD Collapsed vertebra, not elsewhere classified, thoracic region, subsequent encounter for fracture with routine healing: Secondary | ICD-10-CM

## 2018-03-09 DIAGNOSIS — R748 Abnormal levels of other serum enzymes: Secondary | ICD-10-CM

## 2018-03-09 DIAGNOSIS — Z79899 Other long term (current) drug therapy: Secondary | ICD-10-CM

## 2018-03-09 DIAGNOSIS — Z880 Allergy status to penicillin: Secondary | ICD-10-CM

## 2018-03-09 DIAGNOSIS — Z955 Presence of coronary angioplasty implant and graft: Secondary | ICD-10-CM

## 2018-03-09 DIAGNOSIS — K449 Diaphragmatic hernia without obstruction or gangrene: Secondary | ICD-10-CM

## 2018-03-09 DIAGNOSIS — I130 Hypertensive heart and chronic kidney disease with heart failure and stage 1 through stage 4 chronic kidney disease, or unspecified chronic kidney disease: Secondary | ICD-10-CM

## 2018-03-09 DIAGNOSIS — R32 Unspecified urinary incontinence: Secondary | ICD-10-CM

## 2018-03-09 DIAGNOSIS — Z888 Allergy status to other drugs, medicaments and biological substances status: Secondary | ICD-10-CM

## 2018-03-09 DIAGNOSIS — R410 Disorientation, unspecified: Secondary | ICD-10-CM

## 2018-03-09 LAB — IRON AND TIBC
Iron: 32 ug/dL — ABNORMAL LOW (ref 45–182)
Saturation Ratios: 19 % (ref 17.9–39.5)
TIBC: 168 ug/dL — AB (ref 250–450)
UIBC: 136 ug/dL

## 2018-03-09 LAB — T4, FREE: Free T4: 1.2 ng/dL (ref 0.82–1.77)

## 2018-03-09 LAB — BASIC METABOLIC PANEL
Anion gap: 6 (ref 5–15)
BUN: 23 mg/dL (ref 8–23)
CHLORIDE: 108 mmol/L (ref 98–111)
CO2: 22 mmol/L (ref 22–32)
Calcium: 8.2 mg/dL — ABNORMAL LOW (ref 8.9–10.3)
Creatinine, Ser: 2.83 mg/dL — ABNORMAL HIGH (ref 0.61–1.24)
GFR calc non Af Amer: 18 mL/min — ABNORMAL LOW (ref >60)
GFR, EST AFRICAN AMERICAN: 21 mL/min — AB (ref >60)
Glucose, Bld: 85 mg/dL (ref 70–99)
POTASSIUM: 3.3 mmol/L — AB (ref 3.5–5.1)
SODIUM: 136 mmol/L (ref 135–145)

## 2018-03-09 LAB — HEPATITIS PANEL, ACUTE
HCV Ab: <0.1 s/co ratio (ref 0.0–0.9)
HEP B S AG: NEGATIVE
Hep A IgM: NEGATIVE
Hep B C IgM: NEGATIVE

## 2018-03-09 LAB — HEPATIC FUNCTION PANEL
ALBUMIN: 2.5 g/dL — AB (ref 3.5–5.0)
ALK PHOS: 131 U/L — AB (ref 38–126)
ALT: 363 U/L — AB (ref 0–44)
AST: 262 U/L — ABNORMAL HIGH (ref 15–41)
Bilirubin, Direct: 3.1 mg/dL — ABNORMAL HIGH (ref 0.0–0.2)
Indirect Bilirubin: 1.5 mg/dL — ABNORMAL HIGH (ref 0.3–0.9)
TOTAL PROTEIN: 4.8 g/dL — AB (ref 6.5–8.1)
Total Bilirubin: 4.6 mg/dL — ABNORMAL HIGH (ref 0.3–1.2)

## 2018-03-09 LAB — GAMMA GT: GGT: 190 U/L — ABNORMAL HIGH (ref 7–50)

## 2018-03-09 NOTE — Progress Notes (Signed)
Physical Therapy Treatment Patient Details Name: [name] MRN: [id number] DOB: Jul 11, 1926 Today's Date: 03/09/2018    History of Present Illness This is a 84 year old male with a PMH of diastolic CHF,atrial fibrillation, prostate cancer (s/p lupron therapy), and HTN who presented from home with generalized weakness, bowel and bladder incontinence, acute encephalopathy. Found to have elevated liver enzymes.    PT Comments    Pt admitted with above diagnosis. Pt currently with functional limitations due to the deficits listed below (see PT Problem List). Reports walks short distances independently in home without assistive device; Presents with decr activity tolerance, orthsotasis;  Pt will benefit from skilled PT to increase their independence and safety with mobility to allow discharge to the venue listed below.      Follow Up Recommendations  Home health PT;Supervision/Assistance - 24 hour;Other (comment)(Will need to verify amount of assist available)     Equipment Recommendations  Rolling walker with 5" wheels;3in1 (PT)(defer to OT for bathroom recommendations)    Recommendations for Other Services OT consult(as ordered)     Precautions / Restrictions Precautions Precautions: Fall Precaution Comments: Soft BPs and slightly orthostatic on PT eval    Mobility  Bed Mobility Overal bed mobility: Needs Assistance Bed Mobility: Supine to Sit     Supine to sit: Min guard     General bed mobility comments: Cues to use rail; overall good relatively smooth motion to EOB  Transfers Overall transfer level: Needs assistance Equipment used: 1 person hand held assist Transfers: Sit to/from [facility] Sit to Stand: Min assist Stand pivot transfers: Min assist       General transfer comment: Stood from bed and transferred to his R to [facility]; then perfomred transfer to recliner on his L; min assist and cues for safety; cues also to self-monitor for activity  [place]  Ambulation/Gait             General Gait Details: Held progressive amb due to low BP   Stairs             Wheelchair Mobility    Modified Rankin (Stroke Patients Only)       Balance Overall balance assessment: Needs assistance   Sitting balance-Leahy Scale: Good       Standing balance-Leahy Scale: Fair                              Cognition Arousal/Alertness: Awake/alert Behavior During Therapy: WFL for tasks assessed/performed Overall Cognitive Status: No family/caregiver present to determine baseline cognitive functioning                                        Exercises      General Comments General comments (skin integrity, edema, etc.):   03/09/18 1000  Vital Signs  Patient Position (if appropriate) Orthostatic Vitals  Orthostatic Lying   BP- Lying 108/65  Pulse- Lying 63  Orthostatic Sitting  BP- Sitting (!) 77/50  Pulse- Sitting 70  Orthostatic Standing at 0 minutes  BP- Standing at 0 minutes (!) 85/45  Pulse- Standing at 0 minutes 72  Orthostatic Standing at 3 minutes  BP- Standing at 3 minutes (!) 89/48  Pulse- Standing at 3 minutes 71         Pertinent Vitals/Pain Pain Assessment: No/denies pain    Home Living Family/patient expects to be  discharged to:: Private residence Living Arrangements: Spouse/significant other(and possibly spouse's son(?); pt did not indicate how helpful this individual is) Available Help at Discharge: Family;Other (Comment)(Wife's son has been present because she has been sick recently) Type of Home: House Home Access: Ramped entrance   Home Layout: One level        Prior Function Level of Independence: Independent;Independent with assistive device(s)      Comments: Tells me he has not been using RW lately   PT Goals (current goals can now be found in the care plan section) Acute Rehab PT Goals Patient Stated Goal: Hopes to be home soon PT Goal Formulation:  With patient Time For Goal Achievement: 03/23/18 Potential to Achieve Goals: Good    Frequency    Min 3X/week      PT Plan      Co-evaluation              AM-PAC PT "6 Clicks" Daily Activity  Outcome Measure  Difficulty turning over in bed (including adjusting bedclothes, sheets and blankets)?: A Little Difficulty moving from lying on back to sitting on the side of the bed? : A Little Difficulty sitting down on and standing up from a chair with arms (e.g., wheelchair, bedside commode, etc,.)?: A Little Help needed moving to and from a bed to chair (including a wheelchair)?: A Little Help needed walking in hospital room?: A Lot Help needed climbing 3-5 steps with a railing? : A Lot 6 Click Score: 16    End of Session Equipment Utilized During Treatment: Gait belt Activity Tolerance: Other (comment)(Limited by low BPs) Patient left: in chair;with call bell/phone within reach;with chair alarm set Nurse Communication: Mobility status PT Visit Diagnosis: Unsteadiness on feet (R26.81);Other abnormalities of gait and mobility (R26.89)     Time: 5638-7564 PT Time Calculation (min) (ACUTE ONLY): 29 min  Charges:  $Therapeutic Activity: 8-22 mins                     Roney Marion, PT  Acute Rehabilitation Services Pager [phone] Office [phone]    Colletta [place] 03/09/2018, 12:27 PM

## 2018-03-09 NOTE — Progress Notes (Signed)
   Subjective: Mr. Ohman was seen and evaluated at bedside on morning rounds. He was sitting up in his chair and was feeling well. He does not recall what all occurred at home prior to coming into the hospital, but does remember feeling weak, confused and not able to do anything for himself. This morning, he feels back at his baseline and would like to go home as soon as he can. Denies abdominal pain, f/c, n/v.  Objective:  Vital signs in last 24 hours: Vitals:   03/09/18 0000 03/09/18 0402 03/09/18 0729 03/09/18 1210  BP: (!) 99/48 (!) 112/57 (!) 122/58 (!) 98/45  Pulse: 63 62 61 66  Resp:  '18 16 16  '$ Temp: 98.6 F (37 C) (!) 97.5 F (36.4 C) 98 F (36.7 C) 97.6 F (36.4 C)  TempSrc: Oral Oral Oral Oral  SpO2:  97% 96% 98%  Weight:      Height:       Gen: awake, alert pleasant male sitting up in his chair in NAD.  CV: RRR without murmurs, rubs or gallops Resp: Lungs CTA bilaterally  Abd: BS+; soft, non-tender, non-distended  Skin: mildly jaundiced   Hepatic Function Latest Ref Rng & Units 03/09/2018 03/08/2018 10/29/2016  Total Protein 6.5 - 8.1 g/dL 4.8(L) 5.5(L) 6.9  Albumin 3.5 - 5.0 g/dL 2.5(L) 3.1(L) 3.3(L)  AST 15 - 41 U/L 262(H) 753(H) 18  ALT 0 - 44 U/L 363(H) 629(H) 15(L)  Alk Phosphatase 38 - 126 U/L 131(H) 161(H) 55  Total Bilirubin 0.3 - 1.2 mg/dL 4.6(H) 4.2(H) 0.6  Bilirubin, Direct 0.0 - 0.2 mg/dL 3.1(H) - -    Assessment/Plan:  Active Problems:   Acute encephalopathy  Assessment: Mr. Stavely is an 84 y/o gentleman with PMHx of prostate cancer on Lupron therapy, CAD s/p PCI, CKD IV, HFpEF, HTN, HLD, prior cholelithiasis who presents with acute onset generalized weakness and confusion. Work-up revealed elevated transaminases, alk phos, and bili consistent with obstruction.   1. Generalized weakness and confusion: work-up revealed elevated LFTs, alk phos, bili which significantly improved the morning after admission. Given his history of cholelithiasis, I feel it is  likely he passed a stone, causing the rapid onset of weakness and confusion which rapidly improved without intervention. This is an unusual presentation, as he had no abdominal pain, nausea, vomiting, fevers or chills. GI is on board and have planned for MRCP without contrast, along with work-up for possible chronic liver disease. Appreciate their recommendations. Will continue to trend hepatic function panels.   2. CKD IV: creatinine is at baseline; will continue to monitor  3. HTN: continue Toprol, Imdur   4. HFpEF: does not appear to be in acute exacerbation; will follow-up on repeat echo that was ordered; continue home regimen   Dispo: Anticipated discharge in approximately 1-2 day(s).   Modena Nunnery D, DO 03/09/2018, 12:59 PM PGY-1

## 2018-03-09 NOTE — Progress Notes (Signed)
Internal Medicine Attending  Date: 03/09/2018  Patient name: [name] Medical record number: [id number] Date of birth: [birth year] Age: 84 y.o. Gender: male  I saw and evaluated the patient. I reviewed the resident's note by Dr. Koleen Distance and I agree with the resident's findings and plans as documented in her progress note.  Please see my H&P dated 03/09/2018 for the specifics my evaluation, assessment, and plan from earlier in the day.

## 2018-03-09 NOTE — Consult Note (Signed)
[facility] Consult  Referring Provider: Dr. Oval Linsey, MD/Internal medicine teaching team Primary Care Physician:  Care, [place] Primary Gastroenterologist: Althia Forts  Reason for Consultation: abnormal LFTs  HPI: [name] is a 84 y.o. male was admitted yesterday with nonspecific symptoms of weakness, nausea and vomiting along with dizziness and disorientation. As per patient's family member at bedside patient was noted to have increase in stools and confusion when EMS was called. Patient denies current nausea, vomiting and is tolerating regular diet. He is legally blind and is unsure if he is ever notice blood in stool or black stools. However, denies fevers, diarrhea, abdominal pain. His family members state that he had chills when EMS arrived. Patient has a history of cholecystitis treated with cholecystostomy tube placement about 2 years ago,as you was deemed to be a high risk candidate for cholecystectomy. He lives at home with his wife and grandson and reports able to carry daily physical activities independently on a regular basis.  Past Medical History:  Diagnosis Date  . Cardiac disease   . CHF (congestive heart failure) ([place])   . Hypercholesteremia   . Hypertension   . Kidney failure    left  . Osteoporosis   . Prostate cancer ([place])   . Shingles   . Skin cancer    right cheek    Past Surgical History:  Procedure Laterality Date  . CARDIAC CATHETERIZATION  04/13/2014  . CT PERC CHOLECYSTOSTOMY  07/20/2016   Placed at [facility]    Prior to Admission medications   Medication Sig Start Date End Date Taking? Authorizing Provider  Acetaminophen (TYLENOL PO) Take 1-2 tablets by mouth daily as needed (pain/headache).   Yes [provider]  aspirin EC 81 MG tablet Take 81 mg by mouth at bedtime.   Yes [provider]  atorvastatin (LIPITOR) 40 MG tablet Take 40 mg by mouth at bedtime.    Yes [provider]  calcitRIOL (ROCALTROL) 0.25 MCG capsule Take 0.25 mcg by mouth See admin instructions. Take one capsule (0.25 mcg) by mouth every Monday, Wednesday, Friday night 02/06/18  Yes [provider]  Cholecalciferol (VITAMIN D3) 5000 units TABS Take 5,000 Units by mouth daily.   Yes [provider]  doxazosin (CARDURA) 4 MG tablet Take 4 mg by mouth at bedtime.    Yes [provider]  ferrous sulfate 325 (65 FE) MG tablet Take 325 mg by mouth 2 (two) times daily.    Yes [provider]  levothyroxine (SYNTHROID, LEVOTHROID) 88 MCG tablet Take 88 mcg by mouth daily. 02/11/18  Yes [provider]  metoprolol succinate (TOPROL-XL) 50 MG 24 hr tablet Take 50 mg by mouth daily. Take with or immediately following a meal.   Yes [provider]  Omega-3 Fatty Acids (FISH OIL) 1200 MG CAPS Take 1,200 mg by mouth 2 (two) times daily.   Yes [provider]  potassium chloride SA (K-DUR,KLOR-CON) 20 MEQ tablet Take 20 mEq by mouth 2 (two) times daily.  08/18/15  Yes [provider]  sodium bicarbonate 650 MG tablet Take 1,300 mg by mouth 2 (two) times daily.    Yes [provider]  tamsulosin (FLOMAX) 0.4 MG CAPS capsule Take 0.4 mg by mouth at bedtime.    Yes [provider]  torsemide (DEMADEX) 20 MG tablet Take 40 mg by mouth daily.  10/27/15  Yes [provider]  docusate sodium (COLACE) 100 MG capsule Take 1 capsule (100 mg total)  by mouth 2 (two) times daily. Patient not taking: Reported on 03/08/2018 07/23/16   Gladstone Lighter, MD  feeding supplement (BOOST / RESOURCE BREEZE) LIQD Take 1 Container by mouth 2 (two) times daily between meals. Patient not taking: Reported on 03/08/2018 07/23/16   Gladstone Lighter, MD  mupirocin cream Drue Stager) 2 % Apply to affected area 3 times daily Patient not taking: Reported on 11/20/2017 02/04/17   Cuthriell, Roderic Palau D, PA-C  sodium chloride 0.9 % injection Inject 20  MLs into cholecystostomy drain three times weekly. Patient not taking: Reported on 11/20/2017 10/29/16   Clayburn Pert, MD    Current Facility-Administered Medications  Medication Dose Route Frequency Provider Last Rate Last Dose  . heparin injection 5,000 Units  5,000 Units Subcutaneous Q8H Kathi Ludwig, MD   5,000 Units at 03/09/18 0559  . hydrALAZINE (APRESOLINE) tablet 25 mg  25 mg Oral BID Kathi Ludwig, MD   25 mg at 03/09/18 0820  . isosorbide mononitrate (IMDUR) 24 hr tablet 30 mg  30 mg Oral Daily Kathi Ludwig, MD   30 mg at 03/09/18 0819  . levothyroxine (SYNTHROID, LEVOTHROID) tablet 100 mcg  100 mcg Oral Daily Kathi Ludwig, MD   100 mcg at 03/09/18 0820  . metoprolol succinate (TOPROL-XL) 24 hr tablet 50 mg  50 mg Oral Daily Kathi Ludwig, MD   50 mg at 03/09/18 9935  . multivitamin with minerals tablet 1 tablet  1 tablet Oral Daily Kathi Ludwig, MD   1 tablet at 03/09/18 0819  . pantoprazole (PROTONIX) EC tablet 40 mg  40 mg Oral BID Kathi Ludwig, MD   40 mg at 03/09/18 0820  . potassium chloride SA (K-DUR,KLOR-CON) CR tablet 20 mEq  20 mEq Oral BID Kathi Ludwig, MD   20 mEq at 03/09/18 0819  . sodium bicarbonate tablet 1,300 mg  1,300 mg Oral BID Kathi Ludwig, MD   1,300 mg at 03/09/18 0819  . tamsulosin (FLOMAX) capsule 0.4 mg  0.4 mg Oral Daily Kathi Ludwig, MD   0.4 mg at 03/09/18 0820  . torsemide (DEMADEX) tablet 20 mg  20 mg Oral BID Kathi Ludwig, MD   20 mg at 03/09/18 7017    Allergies as of 03/08/2018 - Review Complete 03/08/2018  Allergen Reaction Noted  . Ativan [lorazepam] Other (See Comments) 06/05/2015  . Penicillin g Other (See Comments) 10/25/2014    Family History  Problem Relation Age of Onset  . CAD Mother   . Cancer Mother   . Heart attack Father     Social History   Socioeconomic History  . Marital status: Married    Spouse name: Not on file  . Number of children: Not on  file  . Years of education: Not on file  . Highest education level: Not on file  Occupational History  . Not on file  Social Needs  . Financial resource strain: Not on file  . Food insecurity:    Worry: Not on file    Inability: Not on file  . Transportation needs:    Medical: Not on file    Non-medical: Not on file  Tobacco Use  . Smoking status: Former Smoker    Types: Cigarettes, Cigars  . Smokeless tobacco: Current User    Types: Chew  Substance and Sexual Activity  . Alcohol use: No    Alcohol/week: 0.0 standard drinks  . Drug use: No  . Sexual activity: Not on file  Lifestyle  . Physical activity:    Days per week: Not  on file    Minutes per session: Not on file  . Stress: Not on file  Relationships  . Social connections:    Talks on phone: Not on file    Gets together: Not on file    Attends religious service: Not on file    Active member of club or organization: Not on file    Attends meetings of clubs or organizations: Not on file    Relationship status: Not on file  . Intimate partner violence:    Fear of current or ex partner: Not on file    Emotionally abused: Not on file    Physically abused: Not on file    Forced sexual activity: Not on file  Other Topics Concern  . Not on file  Social History Narrative  . Not on file    Review of Systems: Positive for: GI: Described in detail in HPI.    Gen: chills, rigors,Denies any fever,  night sweats, anorexia, fatigue, weakness, malaise, involuntary weight loss, and sleep disorder CV: Denies chest pain, angina, palpitations, syncope, orthopnea, PND, peripheral edema, and claudication. Resp: Denies dyspnea, cough, sputum, wheezing, coughing up blood. GU : Denies urinary burning, blood in urine, urinary frequency, urinary hesitancy, nocturnal urination, and urinary incontinence. MS: Denies joint pain or swelling.  Denies muscle weakness, cramps, atrophy.  Derm: Denies rash, itching, oral ulcerations, hives,  unhealing ulcers.  Psych: Denies depression, anxiety, memory loss, suicidal ideation, hallucinations,  and confusion. Heme: Denies bruising, bleeding, and enlarged lymph nodes. Neuro:  Denies any headaches, dizziness, paresthesias. Endo:  Denies any problems with DM, thyroid, adrenal function.  Physical Exam: Vital signs in last 24 hours: Temp:  [97.5 F (36.4 C)-98.6 F (37 C)] 97.6 F (36.4 C) (09/01 1210) Pulse Rate:  [61-67] 66 (09/01 1210) Resp:  [16-18] 16 (09/01 1210) BP: (98-131)/(45-61) 98/45 (09/01 1210) SpO2:  [96 %-98 %] 98 % (09/01 1210) Last BM Date: 03/08/18  General:   Alert,  Well-developed, well-nourished, pleasant and cooperative in NAD Elderly, frail-appearing, sitting on bedside chair Head:  Normocephalic and atraumatic. Eyes:  Sclera clear,mild icterus.   Conjunctiva pink. Ears:  Normal auditory acuity. Nose:  No deformity, discharge,  or lesions. Mouth:  No deformity or lesions.  Oropharynx pink & moist. Neck:  Supple; no masses or thyromegaly. Lungs:  Clear throughout to auscultation.   No wheezes, crackles, or rhonchi. No acute distress. Heart:  Regular rate and rhythm; no murmurs, clicks, rubs,  or gallops. Extremities:  Without clubbing or edema. Neurologic:  Alert and  oriented x4;  grossly normal neurologically. Skin:  Intact without significant lesions or rashes. Psych:  Alert and cooperative. Normal mood and affect. Abdomen:  Soft, nontender and non-distended, tympanitic to percussion, No masses, hepatosplenomegaly or hernias noted. Normal bowel sounds, without guarding, and without rebound.         Lab Results: Recent Labs    03/08/18 0250  WBC 5.2  HGB 11.3*  HCT 35.3*  PLT 96*   BMET Recent Labs    03/08/18 0250 03/08/18 1139 03/09/18 0654  NA 135 136 136  K 3.8 3.6 3.3*  CL 102 107 108  CO2 17* 21* 22  GLUCOSE 162* 126* 85  BUN 23 23 23   CREATININE 2.85* 2.69* 2.83*  CALCIUM 8.5* 8.2* 8.2*   LFT Recent Labs     03/09/18 0654  PROT 4.8*  ALBUMIN 2.5*  AST 262*  ALT 363*  ALKPHOS 131*  BILITOT 4.6*  BILIDIR 3.1*  IBILI 1.5*  PT/INR Recent Labs    03/08/18 1139  LABPROT 15.3*  INR 1.22    Studies/Results: Ct Abdomen Pelvis Wo Contrast  Result Date: 03/08/2018 CLINICAL DATA:  Generalized weakness. EXAM: CT ABDOMEN AND PELVIS WITHOUT CONTRAST TECHNIQUE: Multidetector CT imaging of the abdomen and pelvis was performed following the standard protocol without IV contrast. COMPARISON:  CT abdomen and pelvis 07/31/2016. CT chest, abdomen and pelvis 06/25/2016. FINDINGS: Lower chest: Heart size is upper normal. Very small left pleural effusion is identified. Mild dependent atelectasis is seen in the lung bases. Hepatobiliary: No focal liver abnormality is seen. No gallstones, gallbladder wall thickening, or biliary dilatation. Pancreas: No pancreatic ductal dilatation or surrounding inflammatory changes. Pancreas is somewhat atrophic, unchanged. Spleen: Normal in size without focal abnormality. Adrenals/Urinary Tract: Marked left renal atrophy and severe left hydronephrosis due to a 0.8 cm distal left ureteral stone are unchanged. There is also a nonobstructing stone in the upper pole the left kidney measuring 1.5 cm, unchanged. Punctate nonobstructing stone is seen in the upper pole of the right kidney, unchanged. The right kidney is otherwise normal appearance. Urinary bladder appears normal. The adrenal glands are unremarkable. Stomach/Bowel: Large hiatal hernia is unchanged. Small bowel appears normal. Extensive descending and sigmoid colon diverticulosis without diverticulitis is identified. Colon is otherwise normal appearance. Negative for appendicitis. Vascular/Lymphatic: Extensive atherosclerosis without aneurysm. No lymphadenopathy. Reproductive: Prostate is unremarkable. Other: Mildly hazy appearance of mesenteric fat is likely due to congestion and slightly more conspicuous than on prior exams.  Musculoskeletal: No acute or focal abnormality. Mild lumbar degenerative change is present. There is left worse than right hip osteoarthritis. IMPRESSION: No acute abnormality abdomen or pelvis. Small left pleural effusion and mild basilar atelectasis. Severe left renal atrophy and chronic left hydronephrosis due to a distal left ureteral stone. Single nonobstructing stones in both kidneys are unchanged. Atherosclerosis. Diverticulosis without diverticulitis. Large hiatal hernia, unchanged. Electronically Signed   By: Inge Rise M.D.   On: 03/08/2018 07:26   Dg Chest 2 View  Result Date: 03/08/2018 CLINICAL DATA:  Generalized weakness.  Nausea and vomiting. EXAM: CHEST - 2 VIEW COMPARISON:  Chest radiograph July 22, 2016 FINDINGS: Cardiac silhouette is moderately enlarged. Coronary artery calcification versus stent. Large hiatal hernia. Improved aeration lungs. Hyperinflation without pleural effusion or focal consolidation. No pneumothorax. Osteopenia. Mild chronic appearing midthoracic compression fracture. IMPRESSION: 1. Cardiomegaly.  Hyperinflation without acute pulmonary process. 2. Large hiatal hernia. Electronically Signed   By: Elon Alas M.D.   On: 03/08/2018 03:49    Impression: 1.Abnormal liver enzymes T bili 4.2/4.6 AST 757 has trended down to 262 ALT 629 has trended down to 363 ALP 161/131 Normal lipase HAV IgM negative, HBsAg negative, HBc IgM negative, HCV Ab negative Atrophic pancreas without pancreatic ductal dilatation endometrial change Normal-appearing gallbladder without gallstones   2.renal impairment, BUN 23/creatinine 2.83/GFR 18 Severe left renal atrophy and chronic left hydronephrosis due to distal left ureteral stone  3.Extensive descending and sigmoid colon diverticulosis without diverticulitis  4.Large hiatal hernia   Plan: 1.Recommend MRI abdomen and MRCP without contrast or rule out biliary etiology such as passed CBD stone or  choledocholithiasis. Possibility of ischemic hepatitis as both AST and AST have trended down Will order other markers for chronic liver disease such as ceruloplasmin, alpha 1 antitrypsin, iron profile,autoimmune markers-ASMA/AMA Tolerating regular diet, follow-up liver enzymes in a.m.  2.May require urology consult for distal left ureteral stone causing left hydronephrosis   LOS: 1 day   Ronnette Juniper, MD  03/09/2018,  1:58 PM  Pager [phone] If no answer or after 5 PM call [phone]

## 2018-03-09 NOTE — H&P (Signed)
Internal Medicine Attending Admission Note Date: 03/09/2018  Patient name: [name] Medical record number: [id number] Date of birth: 11/12/1926 Age: 84 y.o. Gender: male  I saw and evaluated the patient. I reviewed the resident's note and I agree with the resident's findings and plan as documented in the resident's note.  Chief Complaint(s): Generalized weakness and confusion 1 day  History - key components related to admission:  [name] is an 84 year old man with a history of prostate cancer on Lupron therapy, coronary artery disease status post percutaneous coronary intervention, stage IV chronic kidney disease, chronic diastolic heart failure, hypertension, hyperlipidemia, and prior cholelithiasis who presents with a 1 day history of generalized weakness and confusion. He was apparently in his usual state of health until the day of admission when his family noted he would stare off into space when answering questions. He also appeared disoriented to them. He began to breathe heavily and would rock back and forth, but was conversive during these episodes. The family noted that with ambulation he would lean against the wall. He did have one episode of vomiting of clear liquid.  EMS was called, but by their arrival he was improved. He denied remembering any difficulties, and specifically denied fevers, shakes, chills, or abdominal pain. In the emergency department he was noted to have elevated transaminases, alkaline phosphatase, and bilirubin. He was therefore admitted to the internal medicine teaching service for further evaluation and care.  When seen on rounds the morning after admission he was without complaints. If it were up to him he said he would be going home.  Physical Exam - key components related to admission:  Vitals:   03/09/18 0000 03/09/18 0402 03/09/18 0729 03/09/18 1210  BP: (!) 99/48 (!) 112/57 (!) 122/58 (!) 98/45  Pulse: 63 62 61 66  Resp:  18 16 16   Temp: 98.6 F (37  C) (!) 97.5 F (36.4 C) 98 F (36.7 C) 97.6 F (36.4 C)  TempSrc: Oral Oral Oral Oral  SpO2:  97% 96% 98%  Weight:      Height:       Gen.: Well-developed, well-nourished, man sitting comfortably in a chair watching TV in no acute distress. He appeared mildly jaundice. Lungs: Clear to auscultation bilaterally anteriorly without wheezes, rhonchi, or rales. Heart: regular rate and rhythm without murmurs, rubs, or gallops. Abdomen: Soft, nontender, without guarding or rebound.  Lab results:  Basic Metabolic Panel: Recent Labs    03/08/18 1139 03/09/18 0654  NA 136 136  K 3.6 3.3*  CL 107 108  CO2 21* 22  GLUCOSE 126* 85  BUN 23 23  CREATININE 2.69* 2.83*  CALCIUM 8.2* 8.2*   Liver Function Tests: Recent Labs    03/08/18 0250 03/09/18 0654  AST 753* 262*  ALT 629* 363*  ALKPHOS 161* 131*  BILITOT 4.2* 4.6*  PROT 5.5* 4.8*  ALBUMIN 3.1* 2.5*   Recent Labs    03/08/18 0844  LIPASE 27   CBC: Recent Labs    03/08/18 0250  WBC 5.2  NEUTROABS 4.5  HGB 11.3*  HCT 35.3*  MCV 95.4  PLT 96*   CBG: Recent Labs    03/08/18 0341  GLUCAP 146*   Thyroid Function Tests: Recent Labs    03/08/18 1021 03/09/18 0654  TSH 0.300*  --   FREET4  --  1.20   Coagulation: Recent Labs    03/08/18 1139  INR 1.22   Urinalysis:  Clear, yellow, specific gravity 1.010, pH 7.0, glucose >  500, hemoglobin moderate, ketones 20, protein 30, nitrite negative, leukocytes negative, red blood cells 0-5 per high-power field, white blood cells 0-5 per high-power field.  Misc. Labs:  Lactic acid 1.43 Hepatitis C antibody undetectable Hepatitis A IgM negative Hepatitis B core IgM negative PSA 2.37 GGT 190  Imaging results:   PA and lateral chest x-ray: Personally reviewed.  Large hiatal hernia, no effusions, infiltrates, or masses. There is a midthoracic compression fracture.  CT scan of the abdomen and pelvis: Personally reviewed.  Significant chronic left  hydronephrosis secondary to a renal calculus, no cholelithiasis, punctate nonobstructing stone in the right renal pelvis, extensive vascular calcifications.  Other results:  EKG: Personally reviewed. Normal sinus rhythm at 75 bpm, left axis deviation, right bundle branch block, significant Q waves in III, no LVH by voltage, no ST segment changes, diffuse T-wave flattening. No significant change from the previous ECG on 07/19/2016.  Assessment & Plan by Problem:  [name] is an 84 year old man with a history of prostate cancer on Lupron therapy, coronary artery disease status post percutaneous coronary intervention, stage IV chronic kidney disease, chronic diastolic heart failure, hypertension, hyperlipidemia, and prior cholelithiasis who presents with a 1 day history of generalized weakness and confusion.  He was found to have an elevated transaminitis, alkaline phosphatase, and bilirubin concerning for some kind of obstructive process. On the morning following admission the transaminitis and alkaline phosphatase improved suggesting that he may have passed a stone. No other explanation was found for his acute weakness and confusion. Thus, at this point, I believe that he was passing a stone and this caused his symptoms, albeit atypical.  1) Generalized weakness and confusion: Most likely secondary to passing of a gallstone with pertubation of the liver. This does require further evaluation and he will undergo an MRCP without contrast. GI has been consulted with the specific question of whether or not he may have recently passed a gallstone and if there is any further evaluation that is necessary.  2) Disposition: If he continues to do well, is felt to be at baseline by his family, and GI feels no further evaluation is necessary after completion of his evaluation, he will be discharged home in the next 24 hours or so.  He will follow-up with his primary care provider.

## 2018-03-10 ENCOUNTER — Inpatient Hospital Stay (HOSPITAL_COMMUNITY): Payer: Medicare Other

## 2018-03-10 DIAGNOSIS — I340 Nonrheumatic mitral (valve) insufficiency: Secondary | ICD-10-CM

## 2018-03-10 LAB — HEPATIC FUNCTION PANEL
ALT: 293 U/L — AB (ref 0–44)
AST: 182 U/L — AB (ref 15–41)
Albumin: 2.5 g/dL — ABNORMAL LOW (ref 3.5–5.0)
Alkaline Phosphatase: 127 U/L — ABNORMAL HIGH (ref 38–126)
BILIRUBIN DIRECT: 1.5 mg/dL — AB (ref 0.0–0.2)
BILIRUBIN INDIRECT: 1.1 mg/dL — AB (ref 0.3–0.9)
Total Bilirubin: 2.6 mg/dL — ABNORMAL HIGH (ref 0.3–1.2)
Total Protein: 4.7 g/dL — ABNORMAL LOW (ref 6.5–8.1)

## 2018-03-10 LAB — BASIC METABOLIC PANEL
ANION GAP: 7 (ref 5–15)
BUN: 23 mg/dL (ref 8–23)
CO2: 23 mmol/L (ref 22–32)
Calcium: 8.1 mg/dL — ABNORMAL LOW (ref 8.9–10.3)
Chloride: 108 mmol/L (ref 98–111)
Creatinine, Ser: 3.26 mg/dL — ABNORMAL HIGH (ref 0.61–1.24)
GFR calc Af Amer: 18 mL/min — ABNORMAL LOW (ref >60)
GFR, EST NON AFRICAN AMERICAN: 16 mL/min — AB (ref >60)
GLUCOSE: 113 mg/dL — AB (ref 70–99)
POTASSIUM: 3.1 mmol/L — AB (ref 3.5–5.1)
Sodium: 138 mmol/L (ref 135–145)

## 2018-03-10 LAB — ALPHA-1-ANTITRYPSIN: A1 ANTITRYPSIN SER: 161 mg/dL (ref 90–200)

## 2018-03-10 LAB — CERULOPLASMIN: CERULOPLASMIN: 18.5 mg/dL (ref 16.0–31.0)

## 2018-03-10 LAB — T3, FREE: T3, Free: 1.5 pg/mL — ABNORMAL LOW (ref 2.0–4.4)

## 2018-03-10 MED ORDER — POTASSIUM CHLORIDE CRYS ER 20 MEQ PO TBCR
40.0000 meq | EXTENDED_RELEASE_TABLET | Freq: Once | ORAL | Status: AC
  Administered 2018-03-10: 40 meq via ORAL
  Filled 2018-03-10: qty 2

## 2018-03-10 MED ORDER — PANTOPRAZOLE SODIUM 40 MG PO TBEC: 40.0000 mg | DELAYED_RELEASE_TABLET | Freq: Two times a day (BID) | ORAL | 1 refills | Status: DC

## 2018-03-10 MED ORDER — SODIUM CHLORIDE 0.9 % IV BOLUS
500.0000 mL | Freq: Once | INTRAVENOUS | Status: AC
  Administered 2018-03-10: 500 mL via INTRAVENOUS

## 2018-03-10 MED ORDER — ISOSORBIDE MONONITRATE ER 30 MG PO TB24: 30.0000 mg | ORAL_TABLET | Freq: Every day | ORAL | 1 refills | Status: DC

## 2018-03-10 NOTE — Discharge Summary (Signed)
Name: [name] MRN: [id number] DOB: January 22, 1927 84 y.o. PCP: [facility], [place]  Date of Admission: 03/08/2018  2:30 AM Date of Discharge: 9/2/20199/08/2017 Attending Physician: No att. providers found  Discharge Diagnosis: 1. Generalized weakness and confusion 2. Transaminitis, elevated alk phos, and bilirubin  Discharge Medications: Allergies as of 03/10/2018      Reactions   Ativan [lorazepam] Other (See Comments)   Hallucinations, combative   Penicillin G Other (See Comments)   Has patient had a PCN reaction causing immediate rash, facial/tongue/throat swelling, SOB or lightheadedness with hypotension: Yes Has patient had a PCN reaction causing severe rash involving mucus membranes or skin necrosis: No Has patient had a PCN reaction that required hospitalization No Has patient had a PCN reaction occurring within the last 10 years: No If all of the above answers are "NO", then may proceed with Cephalosporin use.      Medication List    STOP taking these medications   mupirocin cream 2 % Commonly known as:  BACTROBAN   sodium chloride 0.9 % injection     TAKE these medications   aspirin EC 81 MG tablet Take 81 mg by mouth at bedtime.   atorvastatin 40 MG tablet Commonly known as:  LIPITOR Take 40 mg by mouth at bedtime.   calcitRIOL 0.25 MCG capsule Commonly known as:  ROCALTROL Take 0.25 mcg by mouth See admin instructions. Take one capsule (0.25 mcg) by mouth every Monday, Wednesday, Friday night   docusate sodium 100 MG capsule Commonly known as:  COLACE Take 1 capsule (100 mg total) by mouth 2 (two) times daily.   doxazosin 4 MG tablet Commonly known as:  CARDURA Take 4 mg by mouth at bedtime.   feeding supplement Liqd Take 1 Container by mouth 2 (two) times daily between meals.   ferrous sulfate 325 (65 FE) MG tablet Take 325 mg by mouth 2 (two) times daily.   Fish Oil 1200 MG Caps Take 1,200 mg by mouth 2 (two) times daily.   isosorbide  mononitrate 30 MG 24 hr tablet Commonly known as:  IMDUR Take 1 tablet (30 mg total) by mouth daily.   levothyroxine 88 MCG tablet Commonly known as:  SYNTHROID, LEVOTHROID Take 88 mcg by mouth daily.   metoprolol succinate 50 MG 24 hr tablet Commonly known as:  TOPROL-XL Take 50 mg by mouth daily. Take with or immediately following a meal.   pantoprazole 40 MG tablet Commonly known as:  PROTONIX Take 1 tablet (40 mg total) by mouth 2 (two) times daily.   potassium chloride SA 20 MEQ tablet Commonly known as:  K-DUR,KLOR-CON Take 20 mEq by mouth 2 (two) times daily.   sodium bicarbonate 650 MG tablet Take 1,300 mg by mouth 2 (two) times daily.   tamsulosin 0.4 MG Caps capsule Commonly known as:  FLOMAX Take 0.4 mg by mouth at bedtime.   torsemide 20 MG tablet Commonly known as:  DEMADEX Take 40 mg by mouth daily.   TYLENOL PO Take 1-2 tablets by mouth daily as needed (pain/headache).   Vitamin D3 5000 units Tabs Take 5,000 Units by mouth daily.       Disposition and follow-up:   KirkKirk Sheppard was discharged from [facility] in Good condition.  At the hospital follow up visit please address:  1.  Generalized weakness and confusion: [name] was evaluated for an episode of sudden onset weakness and altered mental status. Extensive work-up revealed elevated transaminases, alk phos and  bili that rapidly improved over the next 2 days. It is likely he either passed a stone with his history of cholelithiasis versus ischemic livery injury from an episode of hypotension. He rapidly returned to baseline without any intervention. Please assess his liver function at follow-up.  He was orthostatic on PT evaluation and was given bolus of fluids prior to discharge with improvement; please repeat orthostatics at follow-up appointment.   2.  Labs / imaging needed at time of follow-up: CMP  3.  Pending labs/ test needing follow-up: labs ordered by GI to work-up  chronic liver disease (alapha-1-antitrypsin, ceruloplasmin, ANA, IFA, anti-smooth muscle antibody, mitochondrial antibodies).  Radiology recommended repeat MRCP in 2 months to evaluate stability of cystic pancreatic lesion.   Follow-up Appointments: Follow-up Information    [facility], [facility]. Schedule an appointment as soon as possible for a visit in 1 week(s).   Specialty:  [place] information: [place] [address] [place] Hospital Course by problem list: [name] is an 84 y/o gentleman with PMHx of prostate cancer on Lupron therapy, CAD s/p PCI, CKD IV, HFpEF, HTN, HLD, prior cholelithiasis who presents with acute onset generalized weakness and confusion. Work-up revealed elevated transaminases, alk phos, and bili.   1. Generalized weakness and confusion: work-up revealed elevated LFTs, alk phos, bili which significantly improved the morning after admission and continued to trend down throughout stay until discharge. Given his history of cholelithiasis, possible etiology is that he passed a stone, causing the rapid onset of weakness and confusion which rapidly improved without intervention. This is an unusual presentation, as he had no abdominal pain, nausea, vomiting, fevers or chills. MRCP showed no evidence of choledocholithiasis or ductal dilation. Another possibility is ischemic injury from episode of hypotension which GI consult felt was most likely.They ordered work-up for chronic liver disease with several labs still pending at discharge (see above). Given that his liver enzymes, alk phos and bilirubin all improved without intervention and patient had returned to his baseline, he was discharged home with home health services for PT/OT.   2. CKD IV: creatinine was slightly above baseline; suspect he might have been dry, as he was orthostatic with PT eval; was resuscitated with IVF prior to discharge.    Discharge Vitals:     BP (!) 119/58 (BP Location: Left Arm)   Pulse 63   Temp 97.6 F (36.4 C) (Oral)   Resp 16   Ht '5\' 7"'  (1.702 m)   Wt 65.6 kg   SpO2 99%   BMI 22.65 kg/m   Pertinent Labs, Studies, and Procedures:  CMP Latest Ref Rng & Units 03/10/2018 03/09/2018 03/08/2018  Glucose 70 - 99 mg/dL 113(H) 85 126(H)  BUN 8 - 23 mg/dL '23 23 23  ' Creatinine 0.61 - 1.24 mg/dL 3.26(H) 2.83(H) 2.69(H)  Sodium 135 - 145 mmol/L 138 136 136  Potassium 3.5 - 5.1 mmol/L 3.1(L) 3.3(L) 3.6  Chloride 98 - 111 mmol/L 108 108 107  CO2 22 - 32 mmol/L 23 22 21(L)  Calcium 8.9 - 10.3 mg/dL 8.1(L) 8.2(L) 8.2(L)  Total Protein 6.5 - 8.1 g/dL 4.7(L) 4.8(L) -  Total Bilirubin 0.3 - 1.2 mg/dL 2.6(H) 4.6(H) -  Alkaline Phos 38 - 126 U/L 127(H) 131(H) -  AST 15 - 41 U/L 182(H) 262(H) -  ALT 0 - 44 U/L 293(H) 363(H) -   MRCP IMPRESSION: 1. No findings on today's examination to account for the patient's  elevated liver function tests. Specifically, no choledocholithiasis or findings to suggest biliary tract obstruction. 2. 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 Repeat abdominal MRI with and without IV gadolinium with MRCP is recommended in 2 months to ensure the stability of this finding.  3. Severe left-sided hydronephrosis and severe atrophy of the left kidney. Large calculus in the left renal collecting system. These findings are better demonstrated on recent CT the abdomen and pelvis. 4. Trace volume of ascites.  CT abd/pelvis  IMPRESSION: No acute abnormality abdomen or pelvis.  Small left pleural effusion and mild basilar atelectasis.  Severe left renal atrophy and chronic left hydronephrosis due to a distal left ureteral stone. Single nonobstructing stones in both kidneys are unchanged.  Atherosclerosis.  Diverticulosis without diverticulitis.  Large hiatal hernia, unchanged.  Discharge Instructions: Discharge Instructions    Call MD for:  difficulty breathing, headache or visual  disturbances   Complete by:  As directed    Call MD for:  extreme fatigue   Complete by:  As directed    Call MD for:  persistant dizziness or light-headedness   Complete by:  As directed    Call MD for:  persistant nausea and vomiting   Complete by:  As directed    Call MD for:  severe uncontrolled pain   Complete by:  As directed    Call MD for:  temperature >100.4   Complete by:  As directed    Diet - low sodium heart healthy   Complete by:  As directed    Discharge instructions   Complete by:  As directed    Mr. Basich, it was a pleasure taking care of you! Glad you are feeling better. We have placed for you to get physical therapy at home to continue to work on improving your strength, as well as a walker to decrease your risk of falls. Continue taking your medications as prescribed.  Please call to make an appointment with your primary care doctor within the next week.  I wish you all the best!   Increase activity slowly   Complete by:  As directed       Signed: Delice Bison, DO 03/13/2018, 5:00 PM   Pager: [phone]

## 2018-03-10 NOTE — Progress Notes (Signed)
Echocardiogram 2D Echocardiogram has been performed.  [name] T [name] 03/10/2018, 1:36 PM

## 2018-03-10 NOTE — Evaluation (Signed)
Occupational Therapy Evaluation Patient Details Name: [name] MRN: [id number] DOB: 24-Oct-1926 Today's Date: 03/10/2018    History of Present Illness This is a 84 year old male with a PMH of diastolic CHF,atrial fibrillation, prostate cancer (s/p lupron therapy), and HTN who presented from home with generalized weakness, bowel and bladder incontinence, acute encephalopathy. Found to have elevated liver enzymes.   Clinical Impression   PT admitted with acute encephalopathy with elevated liver enzymes. Pt currently with functional limitiations due to the deficits listed below (see OT problem list). Pt currently disoriented and agitated easily. Per step son present- pt has hx of delirium during admissions and family wishes to discharge home asap if all workup is complete. Family reports that after last admission 2 days at home and patient was back to baseline. Pt currently with baseline visual deficits and was able to navigate breakfast tray with setup. Pt does have orthostatic BP this session with >20 SBP drop but family reports this is baseline as well.  Pt will benefit from skilled OT to increase their independence and safety with adls and balance to allow discharge [place].     Follow Up Recommendations  Home health OT    Equipment Recommendations  None recommended by OT    Recommendations for Other Services       Precautions / Restrictions Precautions Precautions: Fall Precaution Comments: orthostatic BP see vitals > 20 point drop SBP      Mobility Bed Mobility Overal bed mobility: Needs Assistance Bed Mobility: Supine to Sit     Supine to sit: Supervision     General bed mobility comments: pt able to exit to the Right side of the bed with min cues  Transfers Overall transfer level: Needs assistance Equipment used: 1 person hand held assist Transfers: Sit to/from Stand Sit to Stand: Min guard Stand pivot transfers: Min assist       General transfer comment: pt  requires min (A) more for visual deficits that balance this session. pt does note BP drop so balance could decline if BP continues to drop with movement for adls    Balance Overall balance assessment: Needs assistance   Sitting balance-Leahy Scale: Good       Standing balance-Leahy Scale: Fair                             ADL either performed or assessed with clinical judgement   ADL Overall ADL's : Needs assistance/impaired Eating/Feeding: Modified independent;Bed level Eating/Feeding Details (indicate cue type and reason): pt eating breakfast adn able to scan entire tray to locate all containers Grooming: Wash/dry hands;Set up;Sitting   Upper Body Bathing: Sitting;Set up               Toilet Transfer: Minimal assistance;Stand-pivot Toilet Transfer Details (indicate cue type and reason): pt completed EOB to chair transfer with noted orthostatic BP.            General ADL Comments: pt very easily agitiated this session and reporting so to therapist. Pt once in chair with son in law present reports "i am not trying to be a smart ass or hard to get along with. " Pt expressing his frustrations regarding food arrival and being in the bed. pt more relaxed and taking medications with RN at the end of session. OT verbalized orthostatic BP and concerns with cognition to family present at the end of session     Vision Baseline Vision/History: Legally  blind Vision Assessment?: Vision impaired- to be further tested in functional context Additional Comments: pt states "i am blind" pt with questions expresses that he can see shadows.      Perception     Praxis      Pertinent Vitals/Pain Pain Assessment: No/denies pain     Hand Dominance Right   Extremity/Trunk Assessment Upper Extremity Assessment Upper Extremity Assessment: Generalized weakness   Lower Extremity Assessment Lower Extremity Assessment: Defer to PT evaluation   Cervical / Trunk  Assessment Cervical / Trunk Assessment: Kyphotic   Communication Communication Communication: No difficulties   Cognition Arousal/Alertness: Awake/alert Behavior During Therapy: Agitated Overall Cognitive Status: Impaired/Different from baseline Area of Impairment: Memory;Awareness                     Memory: Decreased recall of precautions;Decreased short-term memory     Awareness: Intellectual   General Comments: pt reports "yall havent fed me in 4 days" "I am about to starve to death" pt was unabel to recall step sons name or relationship.  per family arriving that patient has hx of delirium during admissions with more recovery in home setup    General Comments       Exercises     Shoulder Instructions      Home Living Family/patient expects to be discharged to:: Private residence Living Arrangements: Spouse/significant other Available Help at Discharge: Family Type of Home: House Home Access: Ramped entrance     [place]: One level         [place]: [place]: Environmental consultant - 2 wheels;Cane - single point;Bedside commode          Prior Functioning/Environment Level of Independence: Independent with assistive device(s)                 OT Problem List: Decreased activity tolerance;Decreased cognition;Decreased knowledge of precautions;Decreased knowledge of use of DME or AE;Decreased safety awareness;Decreased strength;Impaired balance (sitting and/or standing);Impaired vision/perception;Cardiopulmonary status limiting activity      OT Treatment/Interventions: Self-care/ADL training;Therapeutic activities;Therapeutic exercise;DME and/or AE instruction;Cognitive remediation/compensation;Visual/perceptual remediation/compensation;Patient/family education;Balance training    OT Goals(Current goals can be found in the care plan section) Acute Rehab OT Goals Patient Stated Goal: wants to go home OT Goal Formulation: With  patient/family Time For Goal Achievement: 03/21/18 Potential to Achieve Goals: Good  OT Frequency: Min 2X/week   Barriers to D/C:            Co-evaluation              AM-PAC PT "6 Clicks" Daily Activity     Outcome Measure Help from another person eating meals?: None Help from another person taking care of personal grooming?: A Little Help from another person toileting, which includes using toliet, bedpan, or urinal?: A Little Help from another person bathing (including washing, rinsing, drying)?: A Little Help from another person to put on and taking off regular upper body clothing?: A Little Help from another person to put on and taking off regular lower body clothing?: A Little 6 Click Score: 19   End of Session Equipment Utilized During Treatment: Gait belt Nurse Communication: Mobility status;Precautions  Activity Tolerance: Patient tolerated treatment well Patient left: in chair;with chair alarm set;with family/visitor present;with nursing/sitter in room  OT Visit Diagnosis: Cognitive communication deficit (R41.841)                Time: 9450-3888 OT Time Calculation (min): 25 min Charges:  OT General Charges $  OT Visit: 1 Visit OT Evaluation $OT Eval Moderate Complexity: 1 Mod   Jeri Modena, OTR/L  Acute Rehabilitation Services Pager: [phone] Office: [phone] .   Parke Poisson B 03/10/2018, 10:28 AM

## 2018-03-10 NOTE — Progress Notes (Signed)
   Subjective: Mr. Mayeda was seen and evaluated at bedside this morning. He had no acute events overnight. States he feels much better than when we came in and is ready to go home. Denies abdominal pain, dyspnea, f/c, urinary symptoms or changes in bowel habits.   Objective:  Vital signs in last 24 hours: Vitals:   03/09/18 1541 03/09/18 2329 03/10/18 0018 03/10/18 0339  BP: (!) 105/56 (!) 143/65 (!) 103/56 (!) 111/52  Pulse: 66 64 69 67  Resp: 20 18    Temp: 98.6 F (37 C) 97.7 F (36.5 C) 98.2 F (36.8 C) 99.1 F (37.3 C)  TempSrc: Oral Oral Oral Oral  SpO2: 98% 97% 99% 97%  Weight:      Height:       General: awake, alert pleasant gentleman lying in bed in NAD CV: RRR without murmurs, rubs or gallops Resp: clear to auscultation bilaterally in ant fields without wheezes, rhonchi or rales Abdomen: BS+; abdomen is soft, non-tender, non-distended Skin: no jaundice   Assessment/Plan:  Active Problems:   Acute encephalopathy   Weakness   Elevated LFTs   Chronic kidney disease, stage 4 (severe) (HCC)   Aortic atherosclerosis (HCC)   Bilateral nephrolithiasis   Hydronephrosis concurrent with and due to calculi of kidney and ureter  Assessment: Mr. Hayashi is an 84 y/o gentleman with PMHx of prostate cancer on Lupron therapy, CAD s/p PCI, CKD IV, HFpEF, HTN, HLD, prior cholelithiasis who presents with acute onset generalized weakness and confusion. Work-up revealed elevated transaminases, alk phos, and bili consistent with obstruction.   1. Generalized weakness and confusion: work-up revealed elevated LFTs, alk phos, bili which significantly improved the morning after admission and continued to improve today. Given his history of cholelithiasis, possible etiology is that he passed a stone, causing the rapid onset of weakness and confusion which rapidly improved without intervention. This is an unusual presentation, as he had no abdominal pain, nausea, vomiting, fevers or chills. MRCP  showed no evidence of choledocholithiasis or ductal dilation. Another possibility is ischemic injury from episode of hypotension which GI feels is most likely. They ordered work-up for chronic liver disease which is still pending, but noted these could be followed up outpatient. Appreciate their input.  GI is on board and have planned for MRCP without contrast, along with work-up for possible chronic liver disease. Appreciate their recommendations. Since his liver enzymes, alk phos and bilirubin have all improved without intervention and patient has returned to his baseline, we will discharge him home today. PT/OT evaluation recommended home health services and walker upon discharge which we will arrange.   2. CKD IV: creatinine is slightly above baseline; suspect he might be dry and was orthostatic with PT eval; have ordered a 500 mL bolus prior to discharge.   3. HTN: continue Toprol, Imdur   4. HFpEF: does not appear to be in acute exacerbation; will follow-up on repeat echo that was ordered; continue home regimen  Dispo: Anticipated discharge home today.   Modena Nunnery D, DO 03/10/2018, 7:20 AM Pager: [phone]

## 2018-03-10 NOTE — Progress Notes (Signed)
Physical Therapy Treatment Patient Details Name: [name] MRN: [id number] DOB: 08/27/1926 Today's Date: 03/10/2018    History of Present Illness This is a 84 year old male with a PMH of diastolic CHF,atrial fibrillation, prostate cancer (s/p lupron therapy), and HTN who presented from home with generalized weakness, bowel and bladder incontinence, acute encephalopathy. Found to have elevated liver enzymes.    PT Comments    Patient seen for mobility progression. Pt tolerated session well with no c/o dizziness or pain. Pt requires min guard/min A for safe ambulation given balance deficits and min/mod cues for safe use of RW. Pt will benefit from further skilled PT services to maximize independence and safety with mobility and will need 24 hour supervision/assistance.     Follow Up Recommendations  Home health PT;Supervision/Assistance - 24 hour;Other (comment)     Equipment Recommendations  Rolling walker with 5" wheels;3in1 (PT)    Recommendations for Other Services       Precautions / Restrictions Precautions Precautions: Fall Precaution Comments: orthostatic BP     Mobility  Bed Mobility Overal bed mobility: Needs Assistance Bed Mobility: Supine to Sit     Supine to sit: Supervision     General bed mobility comments: pt OOB in chair upon arrival  Transfers Overall transfer level: Needs assistance Equipment used: 1 person hand held assist Transfers: Sit to/from Stand Sit to Stand: Min guard Stand pivot transfers: Min assist       General transfer comment: min guard for safety; pt impulsive to stand  Ambulation/Gait Ambulation/Gait assistance: Min guard;Min assist   Assistive device: Rolling walker (2 wheeled)(and assist at trunk with gait belt) Gait Pattern/deviations: Step-through pattern;Drifts right/left     General Gait Details: pt ambulated with and without AD; pt requires assistance for balance without AD and for safety with RW; pt is impulsive and  picking up RW despite cues for safe use   Stairs             Wheelchair Mobility    Modified Rankin (Stroke Patients Only)       Balance Overall balance assessment: Needs assistance   Sitting balance-Leahy Scale: Good       Standing balance-Leahy Scale: Fair                              Cognition Arousal/Alertness: Awake/alert Behavior During Therapy: Impulsive Overall Cognitive Status: Impaired/Different from baseline Area of Impairment: Memory;Awareness;Safety/judgement                     Memory: Decreased recall of precautions;Decreased short-term memory   Safety/Judgement: Decreased awareness of safety;Decreased awareness of deficits Awareness: Intellectual   General Comments: pt reports "yall havent fed me in 4 days" "I am about to starve to death" pt was unabel to recall step sons name or relationship.  per family arriving that patient has hx of delirium during admissions with more recovery in home setup       Exercises      General Comments        Pertinent Vitals/Pain Pain Assessment: No/denies pain    Home Living Family/patient expects to be discharged to:: Private residence Living Arrangements: Spouse/significant other Available Help at Discharge: Family Type of Home: House Home Access: Ramped entrance   Home Layout: One level Home Equipment: Environmental consultant - 2 wheels;Cane - single point;Bedside commode      Prior Function Level of Independence: Independent with assistive device(s)  PT Goals (current goals can now be found in the care plan section) Acute Rehab PT Goals Patient Stated Goal: wants to go home Progress towards PT goals: Progressing toward goals    Frequency    Min 3X/week      PT Plan Current plan remains appropriate    Co-evaluation              AM-PAC PT "6 Clicks" Daily Activity  Outcome Measure  Difficulty turning over in bed (including adjusting bedclothes, sheets and  blankets)?: A Little Difficulty moving from lying on back to sitting on the side of the bed? : A Little Difficulty sitting down on and standing up from a chair with arms (e.g., wheelchair, bedside commode, etc,.)?: A Little Help needed moving to and from a bed to chair (including a wheelchair)?: A Little Help needed walking in hospital room?: A Little Help needed climbing 3-5 steps with a railing? : A Lot 6 Click Score: 17    End of Session Equipment Utilized During Treatment: Gait belt Activity Tolerance: Patient tolerated treatment well Patient left: in chair;with call bell/phone within reach;with chair alarm set Nurse Communication: Mobility status PT Visit Diagnosis: Unsteadiness on feet (R26.81);Other abnormalities of gait and mobility (R26.89)     Time: 1109-1130 PT Time Calculation (min) (ACUTE ONLY): 21 min  Charges:  $Gait Training: 8-22 mins                     Earney Navy, PTA Pager: [phone]     Darliss Cheney 03/10/2018, 11:36 AM

## 2018-03-10 NOTE — Progress Notes (Signed)
Nutrition Brief Note  Patient identified on the Malnutrition Screening Tool (MST) Report.  84 year old male who presented to the ED with weakness. PMH significant for CHF, CAD s/p stent, prostate cancer s/p Lupron therapy, atrial fibrillation, hyperlipidemia, and hypertension.  Spoke with pt at bedside who reports having a good appetite both during admission and PTA. Pt reports eating 3 meals daily. Breakfast includes eggs and "whatever comes up." Lunch and dinner include "whatever they give me."  Pt denies recent weight loss or weight changes. Pt denies any issues chewing or swallowing.  Nutrition-focused physical exam completed. Findings are mild fat depletions in orbital region and mild muscle depletions in temples, clavicles, and lower extremities. Wasting likely related to older age and inactivity rather than malnutrition given stable weight and adequate PO intake.  Wt Readings from Last 15 Encounters:  03/08/18 65.6 kg  11/20/17 65.6 kg  05/23/17 66.2 kg  02/28/17 66.5 kg  02/25/17 68 kg  02/04/17 68 kg  11/21/16 64.5 kg  11/07/16 63.5 kg  10/29/16 65.5 kg  08/30/16 63.5 kg  08/19/16 63.5 kg  08/07/16 64.4 kg  07/31/16 64.8 kg  07/22/16 66.6 kg  06/24/16 75.8 kg    Body mass index is 22.65 kg/m. Patient meets criteria for normal weight based on current BMI.   Current diet order is Heart Healthy, patient is consuming approximately 50% of meals at this time. Labs and medications reviewed.   No nutrition interventions warranted at this time. If nutrition issues arise, please consult RD.    Gaynell Face, MS, RD, LDN Pager: [phone] Weekend/After Hours: [phone]

## 2018-03-10 NOTE — Care Management Note (Addendum)
Case Management Note  Patient Details  Name: [name] MRN: [id number] Date of Birth: 11-09-1926  Subjective/Objective:       Pt in with acute encephalopathy. He is from home with his spouse and grandson. Per his grandson they are only at home a short period of time each day alone. CM informed the grandson that we are recommending 24 hour supervision. He is to update his family. Pt has no issues with obtaining his medications or with transportation.              Action/Plan: Pt with orders for [facility] services. CM provided choice to his grandson who selected [facility]. Dorian Pod with [facility] notified and accepted the referral. Pt has all ordered DME at his home already. Bedside RN to call the pts home when pt is ready for d/c and the grandson will arrange transportation home.   Expected Discharge Date:  03/10/18               Expected Discharge Plan:  [place]  In-House Referral:     Discharge planning Services  CM Consult  Post Acute Care Choice:  Home Health Choice offered to:  Patient, Adult Children(grandson)  DME Arranged:    DME Agency:     HH Arranged:  PT, OT HH Agency:  [facility]  Status of Service:  Completed, signed off  If discussed at [place] of Stay Meetings, dates discussed:    Additional Comments:  Pollie Friar, RN 03/10/2018, 1:50 PM

## 2018-03-10 NOTE — Progress Notes (Signed)
Patient discharge home .  IV and telemetry discontinued.  AVS discussed with patient's grandson.  Patient is comfortable at baseline upon discharge.  Patient the unit with grandson.

## 2018-03-10 NOTE — Progress Notes (Signed)
Subjective: Patient was seen during physical therapy. He reports a good appetite and denies abdominal pain, nausea or vomiting.  Objective: Vital signs in last 24 hours: Temp:  [97.6 F (36.4 C)-99.1 F (37.3 C)] 97.9 F (36.6 C) (09/02 0922) Pulse Rate:  [61-80] 80 (09/02 0956) Resp:  [15-20] 15 (09/02 0922) BP: (98-152)/(45-70) 108/66 (09/02 0956) SpO2:  [97 %-99 %] 97 % (09/02 0922) Weight change:  Last BM Date: 03/08/18  BR:AXENMMH his stated age,no pallor GENERAL:no icterus ABDOMEN:appears nondistended EXTREMITIES:no deformity  Lab Results: Results for orders placed or performed during the hospital encounter of 03/08/18 (from the past 48 hour(s))  T4, free     Status: None   Collection Time: 03/09/18  6:54 AM  Result Value Ref Range   Free T4 1.20 0.82 - 1.77 ng/dL    Comment: (NOTE) Biotin ingestion may interfere with free T4 tests. If the results are inconsistent with the TSH level, previous test results, or the clinical presentation, then consider biotin interference. If needed, order repeat testing after stopping biotin. Performed at [facility], [place] [address], [place], [place] [address]   T3, free     Status: Abnormal   Collection Time: 03/09/18  6:54 AM  Result Value Ref Range   T3, Free 1.5 (L) 2.0 - 4.4 pg/mL    Comment: (NOTE) Performed At: [facility] [place], [place] [id number] Rush Farmer MD OP:[id number]   Basic metabolic panel     Status: Abnormal   Collection Time: 03/09/18  6:54 AM  Result Value Ref Range   Sodium 136 135 - 145 mmol/L   Potassium 3.3 (L) 3.5 - 5.1 mmol/L   Chloride 108 98 - 111 mmol/L   CO2 22 22 - 32 mmol/L   Glucose, Bld 85 70 - 99 mg/dL   BUN 23 8 - 23 mg/dL   Creatinine, Ser 2.83 (H) 0.61 - 1.24 mg/dL   Calcium 8.2 (L) 8.9 - 10.3 mg/dL   GFR calc non Af Amer 18 (L) >60 mL/min   GFR calc Af Amer 21 (L) >60 mL/min    Comment: (NOTE) The eGFR has been calculated using the CKD EPI  equation. This calculation has not been validated in all clinical situations. eGFR's persistently <60 mL/min signify possible Chronic Kidney Disease.    Anion gap 6 5 - 15    Comment: Performed at [facility] [address], [place], [place] [address]  Hepatic function panel     Status: Abnormal   Collection Time: 03/09/18  6:54 AM  Result Value Ref Range   Total Protein 4.8 (L) 6.5 - 8.1 g/dL   Albumin 2.5 (L) 3.5 - 5.0 g/dL   AST 262 (H) 15 - 41 U/L   ALT 363 (H) 0 - 44 U/L   Alkaline Phosphatase 131 (H) 38 - 126 U/L   Total Bilirubin 4.6 (H) 0.3 - 1.2 mg/dL   Bilirubin, Direct 3.1 (H) 0.0 - 0.2 mg/dL   Indirect Bilirubin 1.5 (H) 0.3 - 0.9 mg/dL    Comment: Performed at [facility] [address] [place], [place] [address]  Gamma GT     Status: Abnormal   Collection Time: 03/09/18  7:21 AM  Result Value Ref Range   GGT 190 (H) 7 - 50 U/L    Comment: Performed at [facility], [place] [address], [place], [place] [address]  Iron and TIBC     Status: Abnormal   Collection Time:  03/09/18  2:50 PM  Result Value Ref Range   Iron 32 (L) 45 - 182 ug/dL   TIBC 168 (L) 250 - 450 ug/dL   Saturation Ratios 19 17.9 - 39.5 %   UIBC 136 ug/dL    Comment: Performed at [facility] [address], [place], [place] [address]  Basic metabolic panel     Status: Abnormal   Collection Time: 03/10/18  2:50 AM  Result Value Ref Range   Sodium 138 135 - 145 mmol/L   Potassium 3.1 (L) 3.5 - 5.1 mmol/L   Chloride 108 98 - 111 mmol/L   CO2 23 22 - 32 mmol/L   Glucose, Bld 113 (H) 70 - 99 mg/dL   BUN 23 8 - 23 mg/dL   Creatinine, Ser 3.26 (H) 0.61 - 1.24 mg/dL   Calcium 8.1 (L) 8.9 - 10.3 mg/dL   GFR calc non Af Amer 16 (L) >60 mL/min   GFR calc Af Amer 18 (L) >60 mL/min    Comment: (NOTE) The eGFR has been calculated using the CKD EPI equation. This calculation has not been validated in all clinical situations. eGFR's persistently <60 mL/min signify possible Chronic  Kidney Disease.    Anion gap 7 5 - 15    Comment: Performed at [facility] [address], [place], [place] [address]  Hepatic function panel     Status: Abnormal   Collection Time: 03/10/18  2:50 AM  Result Value Ref Range   Total Protein 4.7 (L) 6.5 - 8.1 g/dL   Albumin 2.5 (L) 3.5 - 5.0 g/dL   AST 182 (H) 15 - 41 U/L   ALT 293 (H) 0 - 44 U/L   Alkaline Phosphatase 127 (H) 38 - 126 U/L   Total Bilirubin 2.6 (H) 0.3 - 1.2 mg/dL   Bilirubin, Direct 1.5 (H) 0.0 - 0.2 mg/dL   Indirect Bilirubin 1.1 (H) 0.3 - 0.9 mg/dL    Comment: Performed at [facility] [address], [place],  [address]    Studies/Results: Mr Abdomen Mrcp Wo Contrast  Result Date: 03/09/2018 CLINICAL DATA:  84 year old male with history of abnormal liver function tests. EXAM: MRI ABDOMEN WITHOUT CONTRAST  (INCLUDING MRCP) TECHNIQUE: Multiplanar multisequence MR imaging of the abdomen was performed. Heavily T2-weighted images of the biliary and pancreatic ducts were obtained, and three-dimensional MRCP images were rendered by post processing. COMPARISON:  No priors. FINDINGS: Comment: Study is limited for detection and characterization of visceral and/or vascular lesions by lack of IV gadolinium. Lower chest: Large hiatal hernia. Small bilateral pleural effusions lying dependently. Hepatobiliary: No definite cystic or solid hepatic lesions are confidently identified on today's noncontrast examination. MRCP images demonstrate no intra or extrahepatic biliary ductal dilatation. Common bile duct measures 5 mm in the porta hepatis. No definite filling defect within the common bile duct to suggest choledocholithiasis. No filling defects in the gallbladder to suggest gallstones. Gallbladder is normal in appearance. Pancreas: 7 mm T1 hypointense, T2 hyperintense lesion in the tail of the pancreas (axial image 14 of series 4), incompletely characterized on today's noncontrast examination. No other definite  pancreatic mass or peripancreatic fluid or inflammatory changes are noted on today's noncontrast examination. Spleen:  Unremarkable. Adrenals/Urinary Tract: Severe atrophy of the left kidney. Severe left hydronephrosis. Filling defect in the dilated left renal collecting system, corresponding to calculus better demonstrated on prior CT the abdomen and pelvis 03/08/2018. Right kidney and bilateral adrenal glands are normal in appearance. Stomach/Bowel: Visualized  portions are unremarkable. Vascular/Lymphatic: Aortic atherosclerosis without definite aneurysm in the abdominal vasculature. Other:  Trace volume of ascites adjacent to the liver. Musculoskeletal: No aggressive appearing osseous lesions are noted in the visualized portions of the skeleton. IMPRESSION: 1. No findings on today's examination to account for the patient's elevated liver function tests. Specifically, no choledocholithiasis or findings to suggest biliary tract obstruction. 2. 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 Repeat abdominal MRI with and without IV gadolinium with MRCP is recommended in 2 months to ensure the stability of this finding. This recommendation follows ACR consensus guidelines: Management of Incidental Pancreatic Cysts: A White Paper of the ACR Incidental Findings Committee. J Am Coll Radiol 6606;00:459-977. 3. Severe left-sided hydronephrosis and severe atrophy of the left kidney. Large calculus in the left renal collecting system. These findings are better demonstrated on recent CT the abdomen and pelvis. 4. Trace volume of ascites. Electronically Signed   By: Vinnie Langton M.D.   On: 03/09/2018 23:12   Mr 3d Recon At Scanner  Result Date: 03/09/2018 CLINICAL DATA:  84 year old male with history of abnormal liver function tests. EXAM: MRI ABDOMEN WITHOUT CONTRAST  (INCLUDING MRCP) TECHNIQUE: Multiplanar multisequence MR imaging of the abdomen was performed. Heavily T2-weighted images of the biliary and pancreatic  ducts were obtained, and three-dimensional MRCP images were rendered by post processing. COMPARISON:  No priors. FINDINGS: Comment: Study is limited for detection and characterization of visceral and/or vascular lesions by lack of IV gadolinium. Lower chest: Large hiatal hernia. Small bilateral pleural effusions lying dependently. Hepatobiliary: No definite cystic or solid hepatic lesions are confidently identified on today's noncontrast examination. MRCP images demonstrate no intra or extrahepatic biliary ductal dilatation. Common bile duct measures 5 mm in the porta hepatis. No definite filling defect within the common bile duct to suggest choledocholithiasis. No filling defects in the gallbladder to suggest gallstones. Gallbladder is normal in appearance. Pancreas: 7 mm T1 hypointense, T2 hyperintense lesion in the tail of the pancreas (axial image 14 of series 4), incompletely characterized on today's noncontrast examination. No other definite pancreatic mass or peripancreatic fluid or inflammatory changes are noted on today's noncontrast examination. Spleen:  Unremarkable. Adrenals/Urinary Tract: Severe atrophy of the left kidney. Severe left hydronephrosis. Filling defect in the dilated left renal collecting system, corresponding to calculus better demonstrated on prior CT the abdomen and pelvis 03/08/2018. Right kidney and bilateral adrenal glands are normal in appearance. Stomach/Bowel: Visualized portions are unremarkable. Vascular/Lymphatic: Aortic atherosclerosis without definite aneurysm in the abdominal vasculature. Other:  Trace volume of ascites adjacent to the liver. Musculoskeletal: No aggressive appearing osseous lesions are noted in the visualized portions of the skeleton. IMPRESSION: 1. No findings on today's examination to account for the patient's elevated liver function tests. Specifically, no choledocholithiasis or findings to suggest biliary tract obstruction. 2. 7 mm cystic lesion in the  tail of the pancreas. Repeat abdominal MRI with and without IV gadolinium with MRCP is recommended in 2 months to ensure the stability of this finding. This recommendation follows ACR consensus guidelines: Management of Incidental Pancreatic Cysts: A White Paper of the ACR Incidental Findings Committee. J Am Coll Radiol 4142;39:532-023. 3. Severe left-sided hydronephrosis and severe atrophy of the left kidney. Large calculus in the left renal collecting system. These findings are better demonstrated on recent CT the abdomen and pelvis. 4. Trace volume of ascites. Electronically Signed   By: Vinnie Langton M.D.   On: 03/09/2018 23:12    Medications: I have reviewed  the patient's current medications.  Assessment: 1.elevated liver enzymes-trending down T bili 2.6, was 4.6 yesterday AST 182, was 262 yesterday ALT 293, was 363 yesterday ALP 127, was 131 yesterday No evidence of choledocholithiasis or biliary tract obstruction on MRCP. Possible ischemic hepatitis. Iron panel unremarkable, alpha 1 antitrypsin level normal,viral hepatitis serology negative  2. 7 mm cystic lesion in tail of pancreas  3.impaired renal function Worsening creatinine 3.26 today, was 8.23 yesterday EGFR reduced to 18, was 21 yesterday Large calculus in the left renal collecting system leading to severe left-sided hydronephrosis and atrophic left kidney   Plan: 1.Other workup for chronic liver disease such as ASMA, AMA, ANA, ceruloplasmin pending. As liver enzymes are trending down, suspect an ischemic injury rather than chronic liver disease based on imaging studies.  Okay to discharge from GI standpoint. We'll sign off, please recall if needed. Okay to follow up remaining labs as an outpatient  2. Recommend urology consult and evaluation for large left renal calculus with left severe hydronephrosis and impaired kidney function    Ronnette Juniper 03/10/2018, 11:49 AM   Pager [phone] If no answer or after 5  PM call [phone]

## 2018-03-11 ENCOUNTER — Other Ambulatory Visit: Payer: Self-pay | Admitting: Internal Medicine

## 2018-03-11 LAB — ANTI-SMOOTH MUSCLE ANTIBODY, IGG: F-Actin IgG: 6 Units (ref 0–19)

## 2018-03-11 LAB — MITOCHONDRIAL ANTIBODIES: Mitochondrial M2 Ab, IgG: <20 Units (ref 0.0–20.0)

## 2018-03-12 LAB — ECHOCARDIOGRAM COMPLETE
Height: 67 in
Weight: 2313.95 oz

## 2018-03-12 LAB — ANTINUCLEAR ANTIBODIES, IFA: ANTINUCLEAR ANTIBODIES, IFA: NEGATIVE

## 2018-03-20 ENCOUNTER — Other Ambulatory Visit: Payer: Self-pay | Admitting: Cardiovascular Disease

## 2018-04-30 ENCOUNTER — Other Ambulatory Visit: Payer: Self-pay | Admitting: Internal Medicine

## 2018-05-20 ENCOUNTER — Other Ambulatory Visit: Payer: Self-pay | Admitting: Internal Medicine

## 2018-05-20 ENCOUNTER — Ambulatory Visit: Payer: Medicare Other

## 2018-05-21 ENCOUNTER — Inpatient Hospital Stay: Payer: Medicare Other | Attending: Radiation Oncology

## 2018-05-21 ENCOUNTER — Inpatient Hospital Stay: Payer: Medicare Other

## 2018-05-21 DIAGNOSIS — C61 Malignant neoplasm of prostate: Secondary | ICD-10-CM | POA: Diagnosis not present

## 2018-05-21 LAB — PSA: PROSTATIC SPECIFIC ANTIGEN: 3.37 ng/mL (ref 0.00–4.00)

## 2018-05-28 ENCOUNTER — Ambulatory Visit
Admission: RE | Admit: 2018-05-28 | Discharge: 2018-05-28 | Disposition: A | Discharge status: Home / Self Care | Payer: Medicare Other | Source: Ambulatory Visit | Attending: Radiation Oncology | Admitting: Radiation Oncology

## 2018-05-28 ENCOUNTER — Other Ambulatory Visit: Payer: Self-pay

## 2018-05-28 ENCOUNTER — Encounter: Payer: Self-pay | Admitting: Radiation Oncology

## 2018-05-28 VITALS — BP 130/63 | HR 60 | Temp 97.6°F | Resp 16 | Wt 145.5 lb

## 2018-05-28 DIAGNOSIS — C61 Malignant neoplasm of prostate: Secondary | ICD-10-CM

## 2018-05-28 NOTE — Progress Notes (Signed)
Radiation Oncology Follow up Note  Name: [name]   Date:   05/28/2018 MRN:  [id number] DOB: [birth year]    This 84 y.o. 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  REFERRING PROVIDER: Care, [facility]  HPI: patient is an 84 year old male now out 13 years having completed radiation therapy to his prostate for Gleason 7 adenocarcinoma. He had biochemical recurrence in 2015 has been treated with pulse Lupron therapy..he is doing fairly well at this time. His most recent PSA was 3.3 up from 2.3 back in August and 0.11 back in May 2019. His last Lupron was approximate 1 year prior.e specifically denies bone pain.  COMPLICATIONS OF TREATMENT: none  FOLLOW UP COMPLIANCE: keeps appointments   PHYSICAL EXAM:  BP 130/63 (BP Location: Left Arm, Patient Position: Sitting)   Pulse 60   Temp 97.6 F (36.4 C) (Tympanic)   Resp 16   Wt 145 lb 8.1 oz (66 kg)   BMI 22.79 kg/m  Elderly male wheelchair-bound in NAD.Well-developed well-nourished patient in NAD. HEENT reveals PERLA, EOMI, discs not visualized.  Oral cavity is clear. No oral mucosal lesions are identified. Neck is clear without evidence of cervical or supraclavicular adenopathy. Lungs are clear to A&P. Cardiac examination is essentially unremarkable with regular rate and rhythm without murmur rub or thrill. Abdomen is benign with no organomegaly or masses noted. Motor sensory and DTR levels are equal and symmetric in the upper and lower extremities. Cranial nerves II through XII are grossly intact. Proprioception is intact. No peripheral adenopathy or edema is identified. No motor or sensory levels are noted. Crude visual fields are within normal range.  RADIOLOGY RESULTS: no current films for review  PLAN: present time patient is doing well. I'll check his PSA again in 6 months. Do not see with his slow incremental increase in PSA  and need for Lupron therapy at this time. Will make further determinations in 6 months. Patient and family comprehend my treatment plan well.  I would like to take this opportunity to thank you for allowing me to participate in the care of your patient.Noreene Filbert, MD

## 2018-06-09 ENCOUNTER — Ambulatory Visit: Payer: Medicare Other

## 2018-06-18 ENCOUNTER — Other Ambulatory Visit: Payer: Self-pay | Admitting: Internal Medicine

## 2018-06-30 ENCOUNTER — Other Ambulatory Visit (HOSPITAL_COMMUNITY): Payer: Self-pay | Admitting: Internal Medicine

## 2018-07-01 ENCOUNTER — Other Ambulatory Visit (HOSPITAL_COMMUNITY): Payer: Self-pay | Admitting: Internal Medicine

## 2018-07-05 ENCOUNTER — Other Ambulatory Visit (HOSPITAL_COMMUNITY): Payer: Self-pay | Admitting: Internal Medicine

## 2018-10-02 IMAGING — CT CT IMAGE GUIDED DRAINAGE BY PERCUTANEOUS CATHETER
1 of 4 series · 10 of 32 positions shown, 16 images · non-contrast
Comparison: none

INDICATION: 89-year-old with acute cholecystitis and suspected gallbladder
perforation.

[Series 2: i-spiral 5.0 b30f · axial · 0.83mm/px · z∈[+1169,+1382]mm · 10 of 75 slices shown, 16 images]
[im 7/75  soft-tissue]
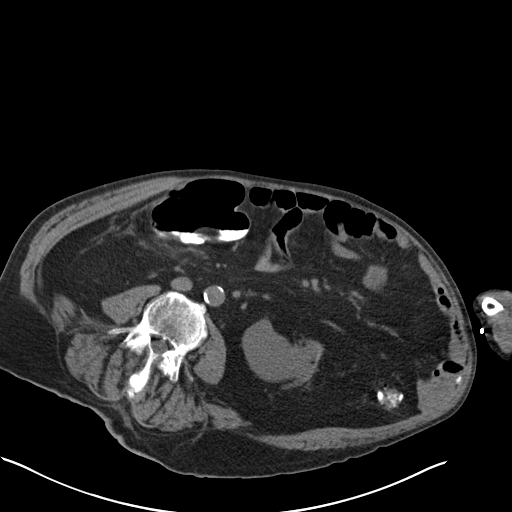
[im 7/75  bone]
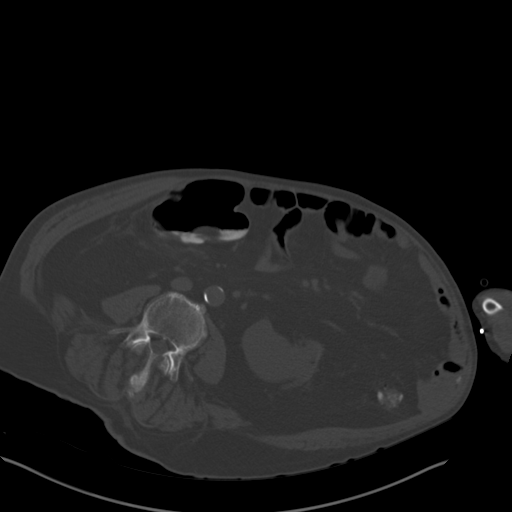
[im 14/75  soft-tissue]
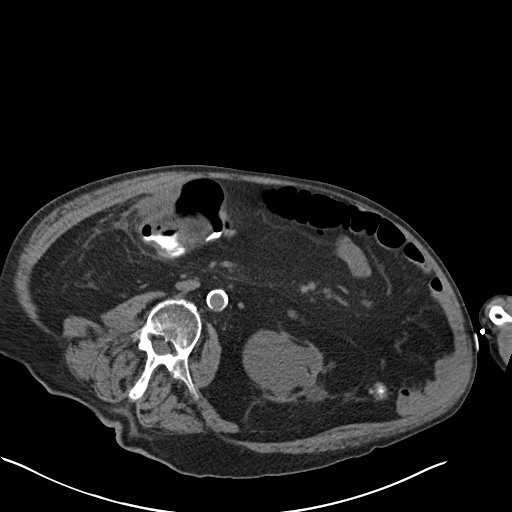
[im 21/75  soft-tissue]
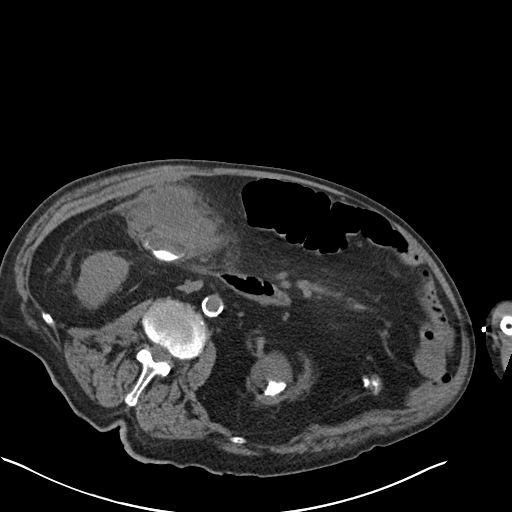
[im 27/75  soft-tissue]
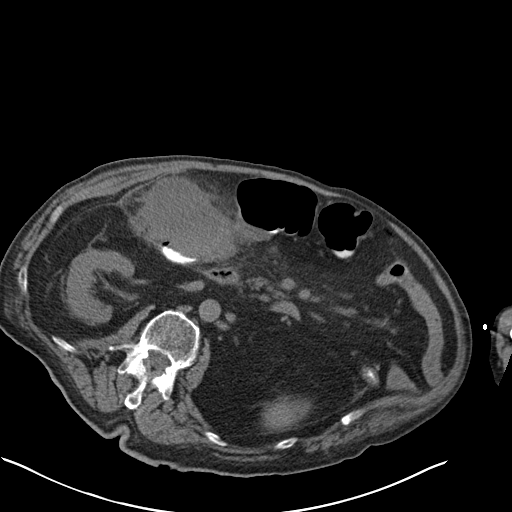
[im 34/75  soft-tissue]
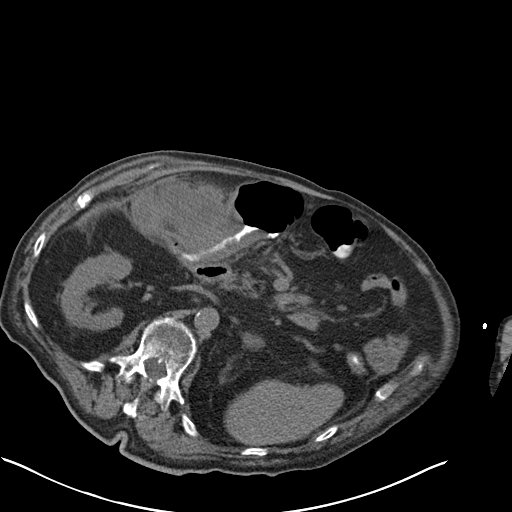
[im 41/75  soft-tissue]
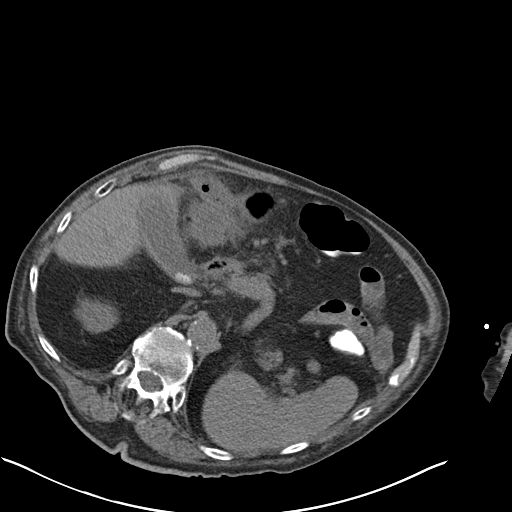
[im 48/75  soft-tissue]
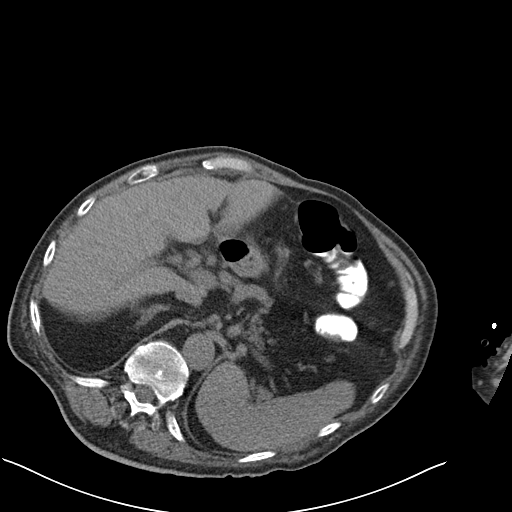
[im 48/75  lung]
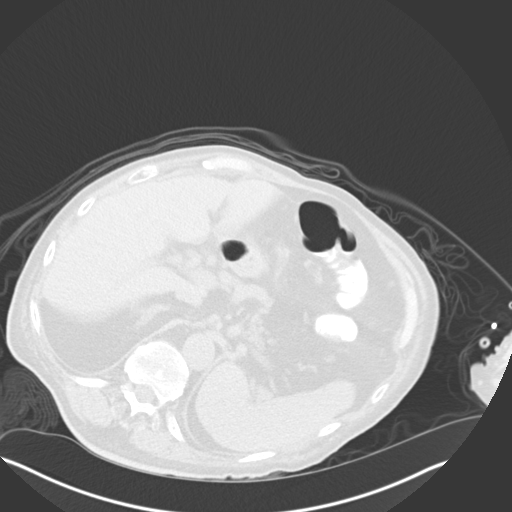
[im 54/75  soft-tissue]
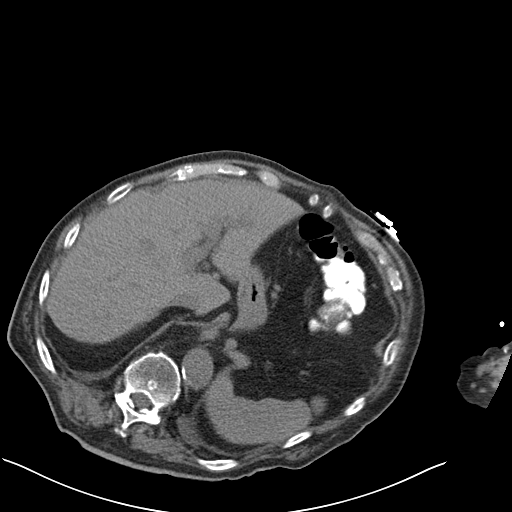
[im 54/75  lung]
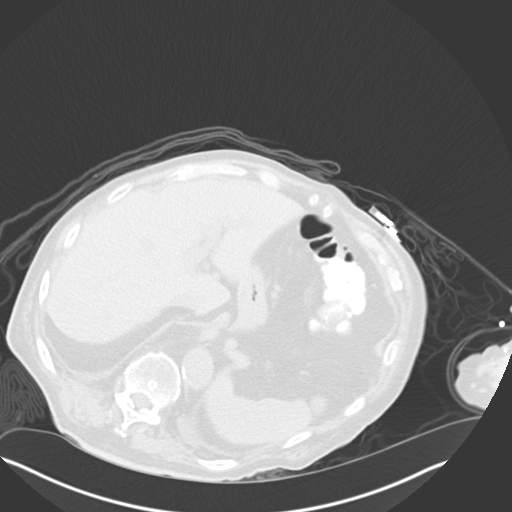
[im 61/75  soft-tissue]
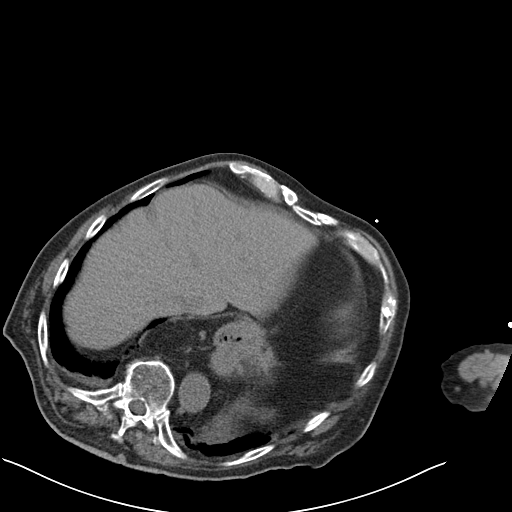
[im 61/75  lung]
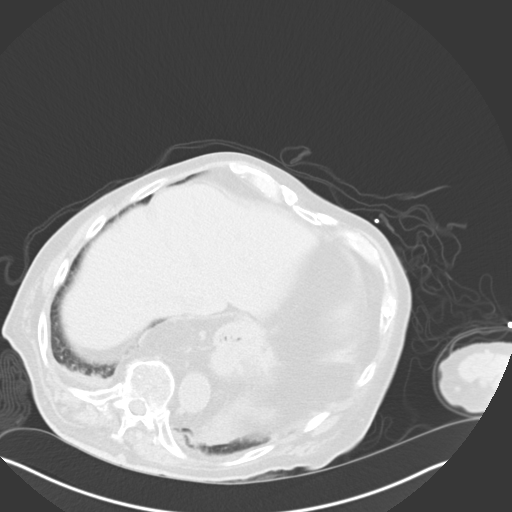
[im 61/75  bone]
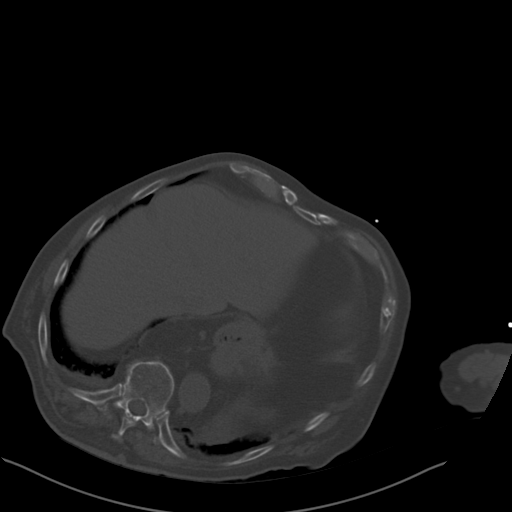
[im 68/75  soft-tissue]
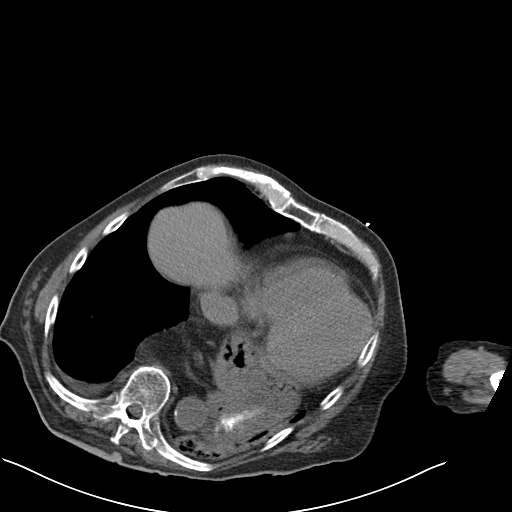
[im 68/75  lung]
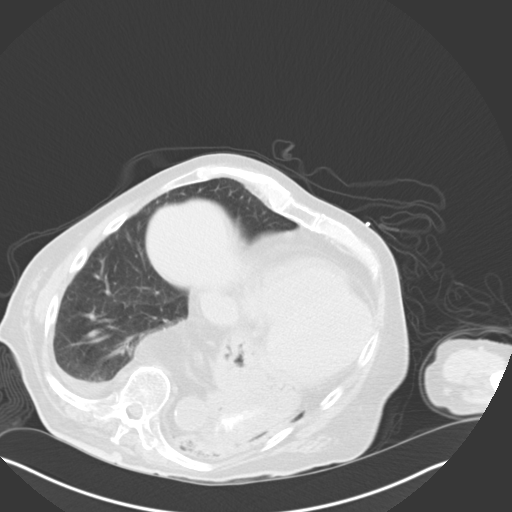

[10 of 32 positions shown; findings below may reference images not displayed]

EXAM:
CT-GUIDED PERCUTANEOUS CHOLECYSTOSTOMY TUBE PLACEMENT

CT-GUIDED DRAIN PLACEMENT IN RIGHT ABDOMINAL ABSCESS

MEDICATIONS:
Inpatient and already on antibiotics.

ANESTHESIA/SEDATION:
Moderate (conscious) sedation was employed during this procedure. A
total of Versed 1.0 mg and Fentanyl 37.5 mcg was administered
intravenously.

Moderate Sedation Time: 41 minutes. The patient's level of
consciousness and vital signs were monitored continuously by
radiology nursing throughout the procedure under my direct
supervision.

FLUOROSCOPY TIME:  None

COMPLICATIONS:
None immediate.

PROCEDURE:
Informed written consent was obtained from the patient after a
thorough discussion of the procedural risks, benefits and
alternatives. All questions were addressed. Maximal Sterile Barrier
Technique was utilized including caps, mask, sterile gowns, sterile
gloves, sterile drape, hand hygiene and skin antiseptic. A timeout
was performed prior to the initiation of the procedure.

Patient was placed on the CT scanner with the right side mildly
elevated. Images through the right upper abdomen were obtained. The
skin was marked in 2 different areas. The skin was prepped with
chlorhexidine and a sterile field was created. Skin was anesthetized
with 1% lidocaine. 18 gauge needle was directed into the right upper
quadrant abscess. Needle position was confirmed within the
collection. However, no fluid could be aspirated from this
collection. Wire was advanced into the collection. Tract was dilated
to accommodate a 10.2 French multipurpose drain. Following placement
of the drain, large amount of thick brown fluid was aspirated.
Approximately 80 mL of thick brown fluid was aspirated from this
right upper quadrant collection. Catheter was attached to a suction
bulb.

Attention was directed to placement of a cholecystostomy tube. Skin
was anesthetized with 1% lidocaine. 18 gauge needle was directed
into the gallbladder fundus with CT guidance. Thick brown fluid was
aspirated from the gallbladder and this is similar to the abscess
fluid. Stiff Amplatz wire was placed and the tract was dilated to
accommodate a 10.2 French multipurpose drain. Approximately 20 mL of
thick brown fluid was removed from the gallbladder. Both drains were
irrigated with normal saline.

Both catheters were sutured to skin. Cholecystostomy tube attached
to gravity bag.
FINDINGS: Complex air-fluid collection in the right upper quadrant adjacent to
the gallbladder. Drain was successfully placed within this
collection and 80 mL of thick brown fluid was removed.
Cholecystostomy tube was successfully placed within the gallbladder.
Evidence for gallstones.
IMPRESSION: Successful placement of a CT-guided cholecystostomy tube and
CT-guided abscess drain in the right upper quadrant. Findings are
compatible with cholecystitis and gallbladder perforation.

Recommend flushing both catheters while the patient is in the
hospital in order to help facilitate drainage of this thick brown
material. Recommend follow-up CT in approximately 1 week to evaluate
the right upper quadrant abscess collection.

## 2018-10-02 IMAGING — CT CT ABD-PELV W/O CM
2 of 4 series · 15 of 46 positions shown, 17 images · non-contrast
Comparison: 06/25/2016

CLINICAL DATA: Fever and low blood pressure. Mild cough. Abdominal
pain.

EXAM:
CT ABDOMEN AND PELVIS WITHOUT CONTRAST
TECHNIQUE: Multidetector CT imaging of the abdomen and pelvis was performed
following the standard protocol without IV contrast.

[Series 2: routine abd/pel wo · axial · 0.81mm/px · z∈[-938,-498]mm · 12 of 97 slices shown, 14 images]
[im 5/97  soft-tissue]
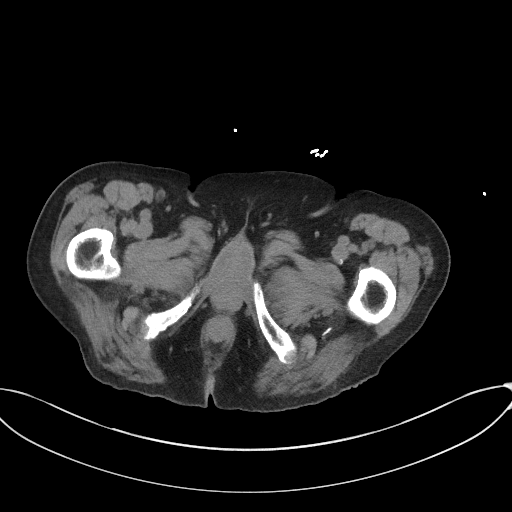
[im 5/97  bone]
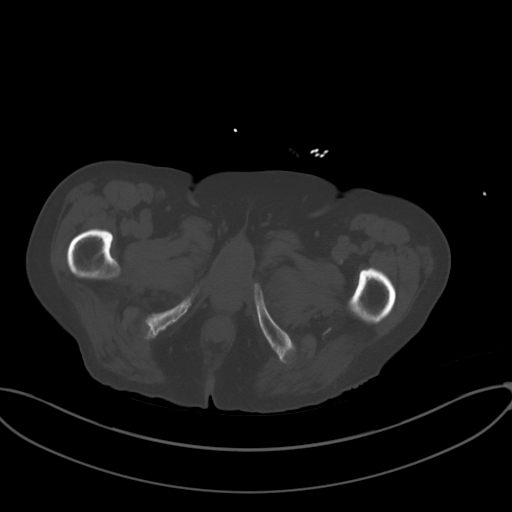
[im 13/97  soft-tissue]
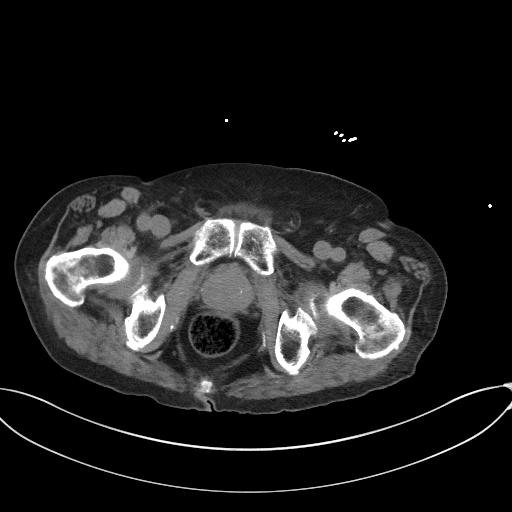
[im 21/97  soft-tissue]
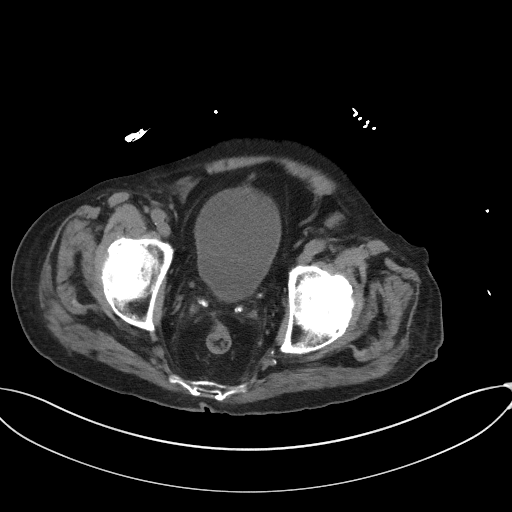
[im 29/97  soft-tissue]
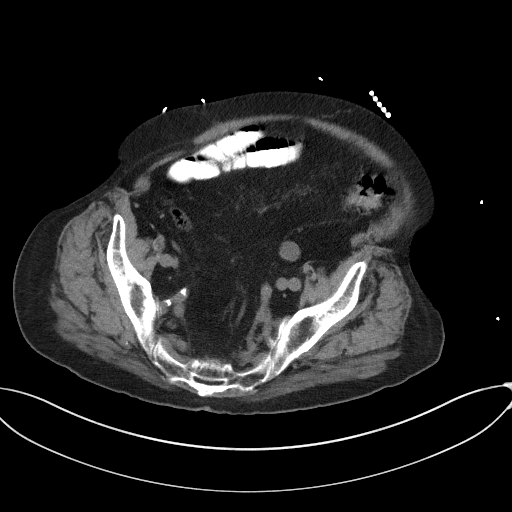
[im 37/97  soft-tissue]
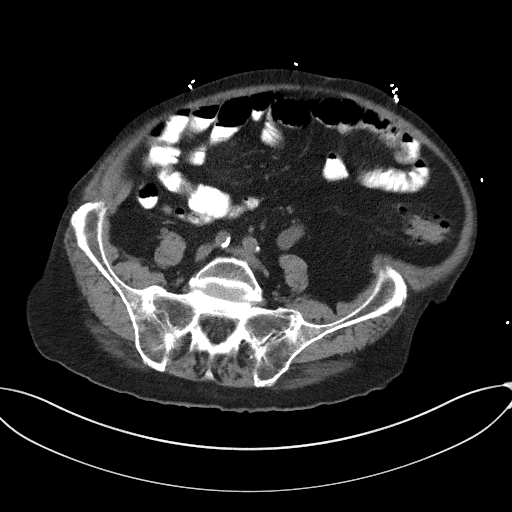
[im 45/97  soft-tissue]
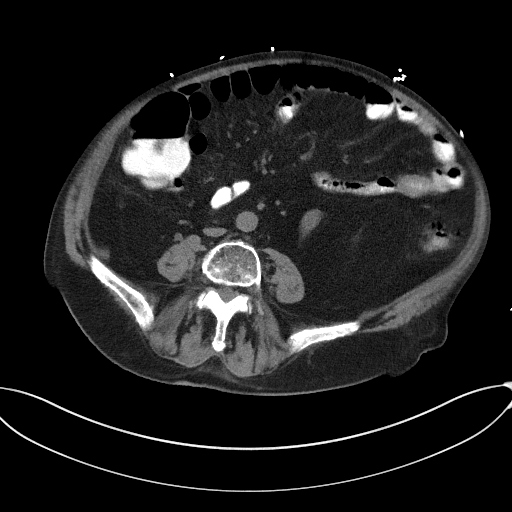
[im 53/97  soft-tissue]
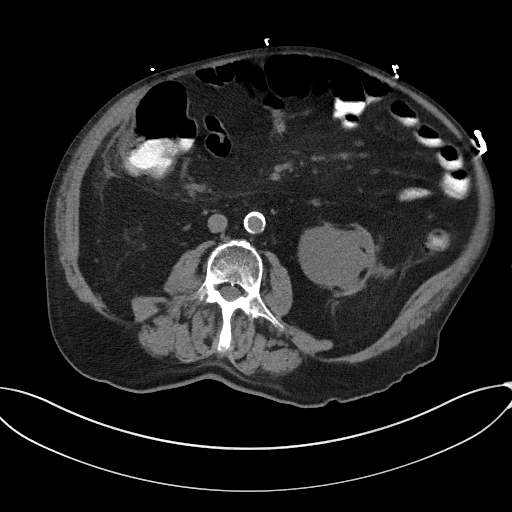
[im 61/97  soft-tissue]
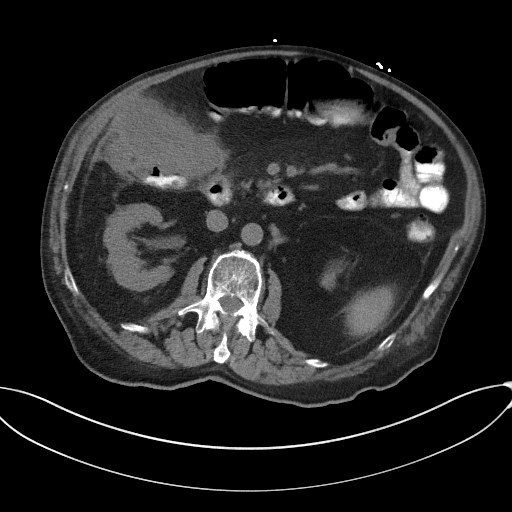
[im 69/97  soft-tissue]
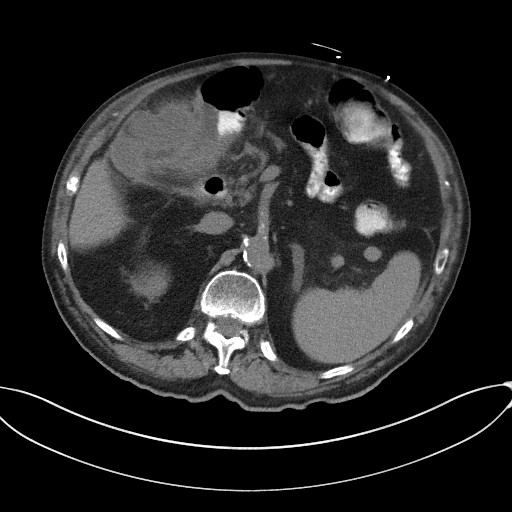
[im 69/97  bone]
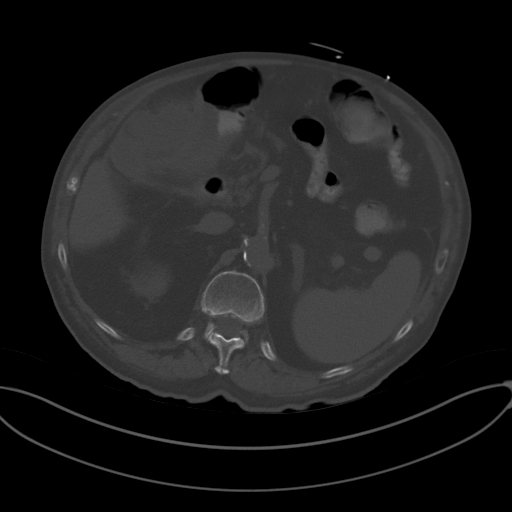
[im 77/97  soft-tissue]
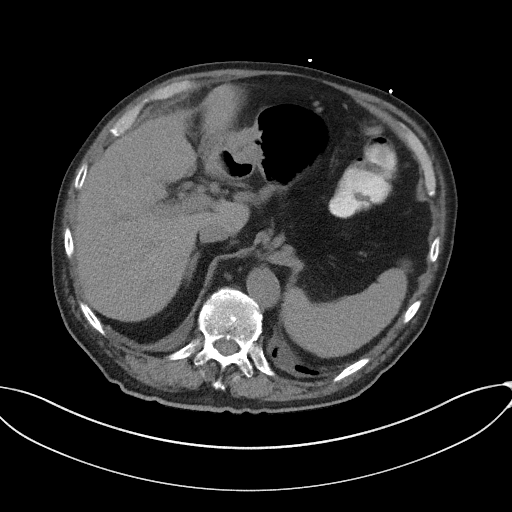
[im 85/97  soft-tissue]
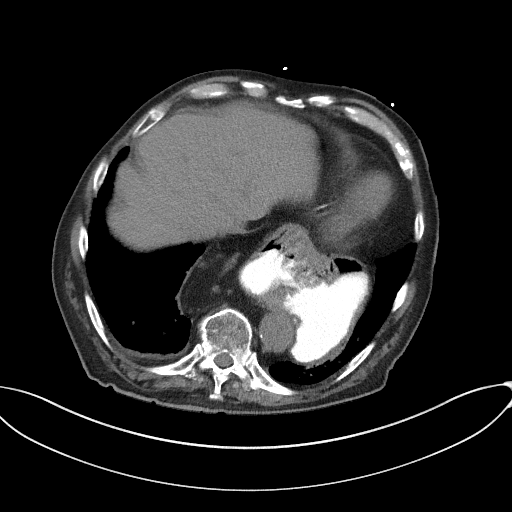
[im 93/97  soft-tissue]
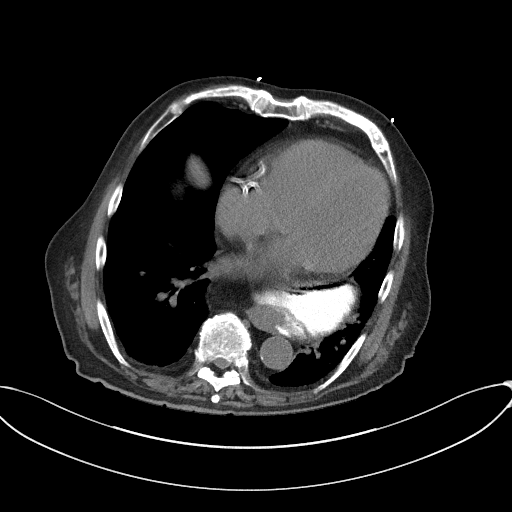

[Series 5: coronal st · coronal · 0.94mm/px · 3 of 107 slices shown]
[im 36/107  soft-tissue]
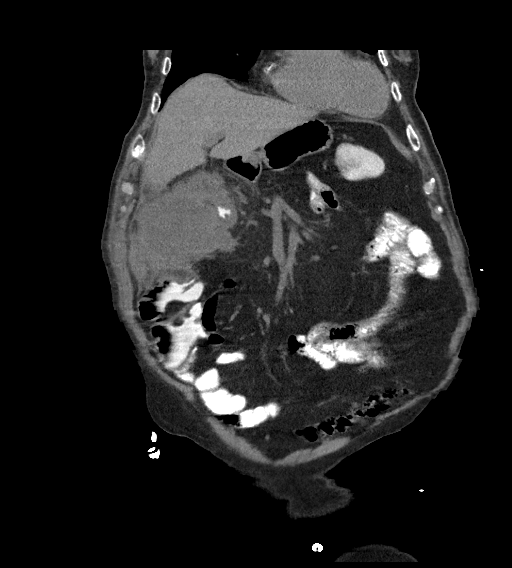
[im 48/107  soft-tissue]
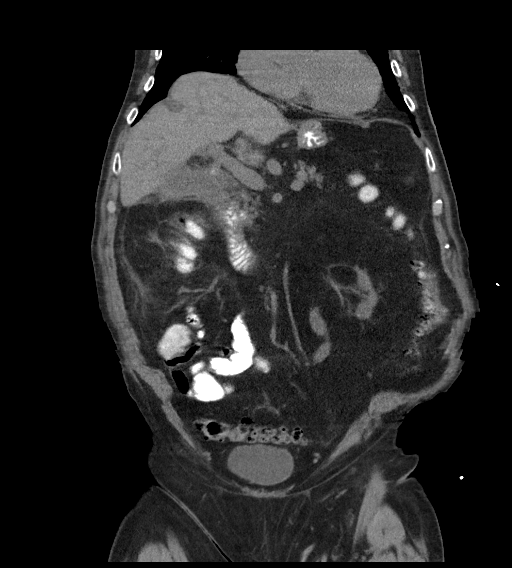
[im 59/107  soft-tissue]
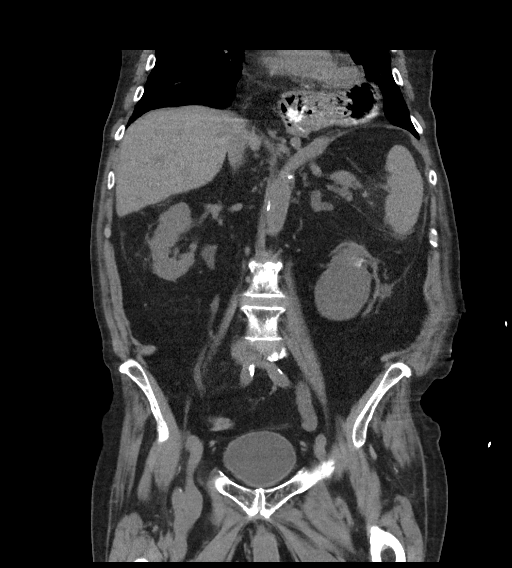

[15 of 46 positions shown; findings below may reference images not displayed]

FINDINGS: Lower chest: Atelectasis in the lung bases. Coronary artery
calcifications. Large esophageal hiatal hernia containing much of
the stomach.

Hepatobiliary: Multiple stones in the gallbladder. Small amount of
gas in the gallbladder. Gallbladder wall is thickened. Changes
consistent with acute cholecystitis. Gas within the gallbladder may
arise from fistula or gangrenous cholecystitis. There is
infiltration in the fat inferior to the gallbladder with a loculated
fluid collection containing small gas bubbles. The collection
measures about 5.9 x 7.7 cm. This likely represents an abscess,
probably arising from perforated cholecystitis. Findings are
progressing since previous study. There is associated inflammatory
thickening of the hepatic flexure of the colon which is likely
reactive. No bile duct dilatation. No focal liver lesions.

Pancreas: Pancreas is atrophic. No peripancreatic collection or
inflammatory change. No pancreatic ductal dilatation.

Spleen: Normal in size without focal abnormality.

Adrenals/Urinary Tract: Adrenal glands are prominent in size,
greater on the left but no distinct nodule is identified. The left
kidney is atrophic with hydronephrosis and hydroureter. Multiple
stones are demonstrated in the distal left ureter, largest measuring
5 mm diameter. There is also large stone in the left renal pelvis
measuring 1.5 cm diameter. These findings are unchanged since the
previous study and suggest chronic left renal obstruction. There is
a punctate size stone in the upper pole right kidney. No
hydronephrosis or hydroureter. Bladder wall is not thickened and no
bladder stones are identified.

Stomach/Bowel: Stomach, small bowel, and colon are not abnormally
distended. Diverticulosis of the sigmoid colon. No evidence of
diverticulitis. Appendix is not identified.

Vascular/Lymphatic: Aortic atherosclerosis. No enlarged abdominal or
pelvic lymph nodes.

Reproductive: Prostate gland is enlarged, measuring 4.4 cm diameter.

Other: Prominent visceral abdominal adipose tissue. No free air or
free fluid in the abdomen. Abdominal wall musculature is thinned but
intact.

Musculoskeletal: No destructive bone lesions.
IMPRESSION: 1. Cholelithiasis with changes of cholecystitis including gas within
thickened gallbladder wall suggesting gangrenous cholecystitis.
There is a developing fluid collection with small gas bubbles
inferior to the gallbladder, measuring 5.9 x 7.7 cm. This is
consistent with a developing abscess, likely arising from the
perforated cholecystitis. There is progression since the previous
study. There is associated inflammatory wall thickening of the
hepatic flexure of the colon.
2. Left renal atrophy with hydronephrosis and hydroureter. Stones
demonstrated in the left renal pelvis and distal ureter. Changes
suggest chronic obstruction. Nonobstructing punctate stone in the
right kidney.
3. Large esophageal hiatal hernia containing most of the stomach.

## 2018-10-04 IMAGING — DX DG CHEST 1V PORT
1 series · 1 of 1 positions shown · non-contrast
Comparison: 07/19/2016.  Chest CT dated 06/25/2016.

CLINICAL DATA: Chest congestion and coughing.  Ex-smoker.

EXAM:
PORTABLE CHEST 1 VIEW

[chest ap]
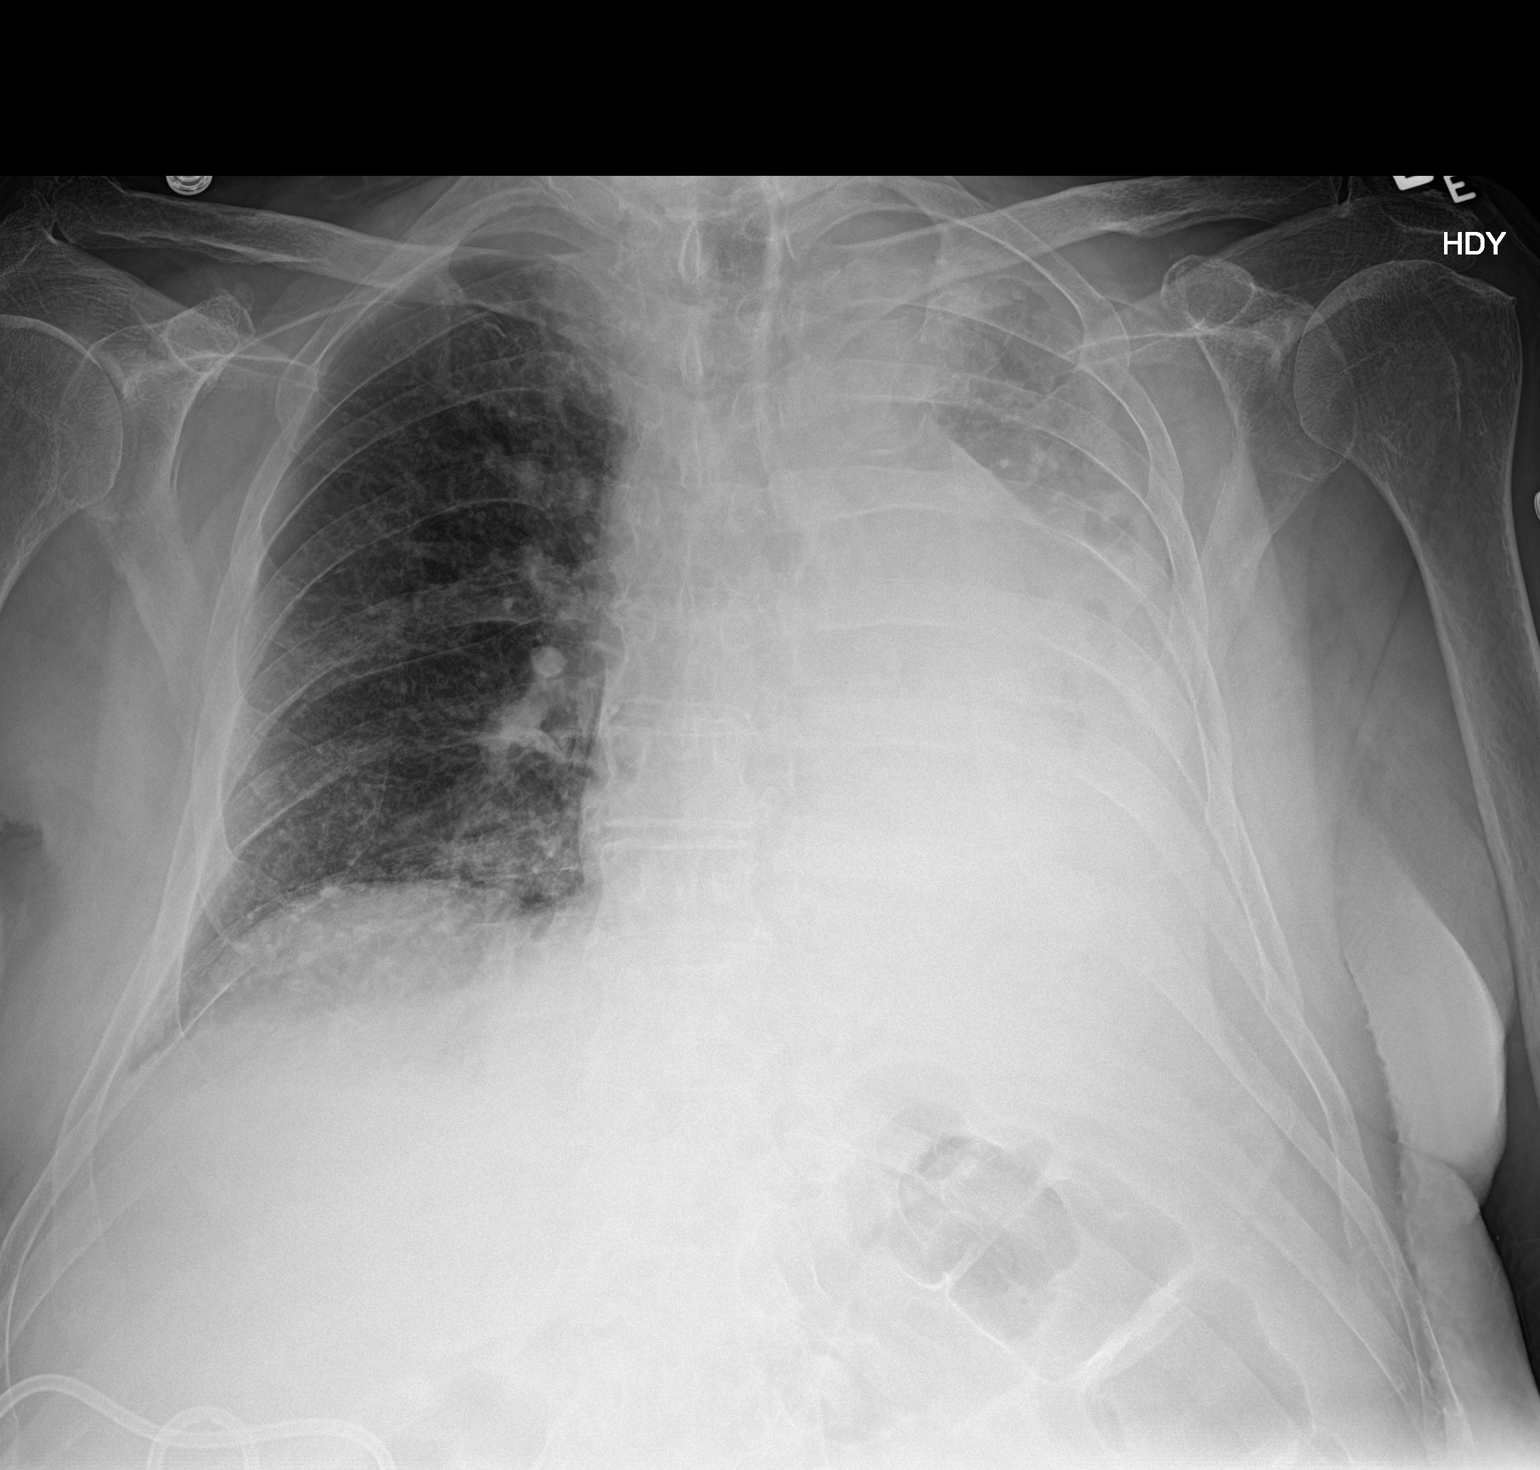

[1 of 1 positions shown; findings below may reference images not displayed]

FINDINGS: Previously demonstrated large hiatal hernia. Interval opacification
of the left hemithorax with some residual aerated lung. Stable
prominence of the interstitial markings on the right. No gross
change in enlargement of the cardiac silhouette, currently shifted
to the left. There is also tracheal deviation to the left. Diffuse
osteopenia. Right upper abdominal pigtail catheter.
IMPRESSION: 1. Interval diffuse left lung volume loss and possible pleural
fluid. This suggests a centrally obstructing mass or mucous plug.
2. Previously demonstrated large hiatal hernia.
3. Mild chronic interstitial lung disease.

## 2018-12-04 ENCOUNTER — Inpatient Hospital Stay: Payer: Medicare Other

## 2018-12-11 ENCOUNTER — Ambulatory Visit: Payer: Medicare Other | Admitting: Radiation Oncology

## 2019-01-01 ENCOUNTER — Other Ambulatory Visit: Payer: Medicare Other

## 2019-01-07 ENCOUNTER — Ambulatory Visit: Payer: Medicare Other | Admitting: Radiation Oncology

## 2019-01-30 ENCOUNTER — Other Ambulatory Visit: Payer: Self-pay

## 2019-02-02 ENCOUNTER — Inpatient Hospital Stay: Payer: Medicare Other

## 2019-02-09 ENCOUNTER — Ambulatory Visit: Payer: Medicare Other | Admitting: Radiation Oncology

## 2019-03-31 ENCOUNTER — Other Ambulatory Visit: Payer: Self-pay

## 2019-03-31 ENCOUNTER — Encounter: Payer: Self-pay | Admitting: Emergency Medicine

## 2019-03-31 ENCOUNTER — Emergency Department: Payer: Medicare Other

## 2019-03-31 ENCOUNTER — Emergency Department
Admission: EM | Admit: 2019-03-31 | Discharge: 2019-03-31 | Disposition: A | Discharge status: Home / Self Care | Payer: Medicare Other | Source: Home / Self Care | Attending: Emergency Medicine | Admitting: Emergency Medicine

## 2019-03-31 DIAGNOSIS — K449 Diaphragmatic hernia without obstruction or gangrene: Secondary | ICD-10-CM | POA: Insufficient documentation

## 2019-03-31 DIAGNOSIS — Z87891 Personal history of nicotine dependence: Secondary | ICD-10-CM | POA: Insufficient documentation

## 2019-03-31 DIAGNOSIS — R1011 Right upper quadrant pain: Secondary | ICD-10-CM

## 2019-03-31 DIAGNOSIS — Z79899 Other long term (current) drug therapy: Secondary | ICD-10-CM | POA: Insufficient documentation

## 2019-03-31 DIAGNOSIS — I130 Hypertensive heart and chronic kidney disease with heart failure and stage 1 through stage 4 chronic kidney disease, or unspecified chronic kidney disease: Secondary | ICD-10-CM | POA: Insufficient documentation

## 2019-03-31 DIAGNOSIS — R509 Fever, unspecified: Secondary | ICD-10-CM | POA: Diagnosis not present

## 2019-03-31 DIAGNOSIS — I509 Heart failure, unspecified: Secondary | ICD-10-CM | POA: Insufficient documentation

## 2019-03-31 DIAGNOSIS — K922 Gastrointestinal hemorrhage, unspecified: Secondary | ICD-10-CM

## 2019-03-31 DIAGNOSIS — A4151 Sepsis due to Escherichia coli [E. coli]: Secondary | ICD-10-CM | POA: Diagnosis not present

## 2019-03-31 DIAGNOSIS — C61 Malignant neoplasm of prostate: Secondary | ICD-10-CM | POA: Insufficient documentation

## 2019-03-31 DIAGNOSIS — N184 Chronic kidney disease, stage 4 (severe): Secondary | ICD-10-CM

## 2019-03-31 DIAGNOSIS — I700 Atherosclerosis of aorta: Secondary | ICD-10-CM

## 2019-03-31 DIAGNOSIS — Z20828 Contact with and (suspected) exposure to other viral communicable diseases: Secondary | ICD-10-CM | POA: Insufficient documentation

## 2019-03-31 DIAGNOSIS — I5032 Chronic diastolic (congestive) heart failure: Secondary | ICD-10-CM | POA: Insufficient documentation

## 2019-03-31 DIAGNOSIS — Z7982 Long term (current) use of aspirin: Secondary | ICD-10-CM | POA: Insufficient documentation

## 2019-03-31 LAB — SARS CORONAVIRUS 2 BY RT PCR (HOSPITAL ORDER, PERFORMED IN ~~LOC~~ HOSPITAL LAB): SARS Coronavirus 2: NEGATIVE

## 2019-03-31 LAB — COMPREHENSIVE METABOLIC PANEL
ALT: 230 U/L — ABNORMAL HIGH (ref 0–44)
AST: 326 U/L — ABNORMAL HIGH (ref 15–41)
Albumin: 2.9 g/dL — ABNORMAL LOW (ref 3.5–5.0)
Alkaline Phosphatase: 242 U/L — ABNORMAL HIGH (ref 38–126)
Anion gap: 16 — ABNORMAL HIGH (ref 5–15)
BUN: 43 mg/dL — ABNORMAL HIGH (ref 8–23)
CO2: 17 mmol/L — ABNORMAL LOW (ref 22–32)
Calcium: 8 mg/dL — ABNORMAL LOW (ref 8.9–10.3)
Chloride: 100 mmol/L (ref 98–111)
Creatinine, Ser: 3.75 mg/dL — ABNORMAL HIGH (ref 0.61–1.24)
GFR calc Af Amer: 16 mL/min — ABNORMAL LOW (ref >60)
GFR calc non Af Amer: 13 mL/min — ABNORMAL LOW (ref >60)
Glucose, Bld: 113 mg/dL — ABNORMAL HIGH (ref 70–99)
Potassium: 4.3 mmol/L (ref 3.5–5.1)
Sodium: 133 mmol/L — ABNORMAL LOW (ref 135–145)
Total Bilirubin: 1.9 mg/dL — ABNORMAL HIGH (ref 0.3–1.2)
Total Protein: 6 g/dL — ABNORMAL LOW (ref 6.5–8.1)

## 2019-03-31 LAB — URINALYSIS, COMPLETE (UACMP) WITH MICROSCOPIC
Bacteria, UA: NONE SEEN
Bilirubin Urine: NEGATIVE
Glucose, UA: NEGATIVE mg/dL
Ketones, ur: NEGATIVE mg/dL
Leukocytes,Ua: NEGATIVE
Nitrite: NEGATIVE
Protein, ur: NEGATIVE mg/dL
Specific Gravity, Urine: 1.011 (ref 1.005–1.030)
WBC, UA: NONE SEEN WBC/hpf (ref 0–5)
pH: 6 (ref 5.0–8.0)

## 2019-03-31 LAB — CBC WITH DIFFERENTIAL/PLATELET
Abs Immature Granulocytes: 0.02 10*3/uL (ref 0.00–0.07)
Basophils Absolute: 0 10*3/uL (ref 0.0–0.1)
Basophils Relative: 0 %
Eosinophils Absolute: 0 10*3/uL (ref 0.0–0.5)
Eosinophils Relative: 0 %
HCT: 35.6 % — ABNORMAL LOW (ref 39.0–52.0)
Hemoglobin: 11.5 g/dL — ABNORMAL LOW (ref 13.0–17.0)
Immature Granulocytes: 0 %
Lymphocytes Relative: 5 %
Lymphs Abs: 0.2 10*3/uL — ABNORMAL LOW (ref 0.7–4.0)
MCH: 30.2 pg (ref 26.0–34.0)
MCHC: 32.3 g/dL (ref 30.0–36.0)
MCV: 93.4 fL (ref 80.0–100.0)
Monocytes Absolute: 0.1 10*3/uL (ref 0.1–1.0)
Monocytes Relative: 2 %
Neutro Abs: 4.3 10*3/uL (ref 1.7–7.7)
Neutrophils Relative %: 93 %
Platelets: 103 10*3/uL — ABNORMAL LOW (ref 150–400)
RBC: 3.81 MIL/uL — ABNORMAL LOW (ref 4.22–5.81)
RDW: 14.4 % (ref 11.5–15.5)
WBC: 4.7 10*3/uL (ref 4.0–10.5)
nRBC: 0 % (ref 0.0–0.2)

## 2019-03-31 LAB — TYPE AND SCREEN
ABO/RH(D): AB POS
Antibody Screen: NEGATIVE

## 2019-03-31 LAB — HEMOGLOBIN AND HEMATOCRIT, BLOOD
HCT: 31 % — ABNORMAL LOW (ref 39.0–52.0)
Hemoglobin: 10.4 g/dL — ABNORMAL LOW (ref 13.0–17.0)

## 2019-03-31 LAB — PROTIME-INR
INR: 1 (ref 0.8–1.2)
Prothrombin Time: 12.7 seconds (ref 11.4–15.2)

## 2019-03-31 NOTE — ED Notes (Signed)
Patient has some chronic facial droop per family

## 2019-03-31 NOTE — ED Provider Notes (Signed)
[facility] Emergency Department Provider Note  ____________________________________________   First MD Initiated Contact with Patient 03/31/19 [phone]     (approximate)  I have reviewed the triage vital signs and the nursing notes.   HISTORY  Chief Complaint Abdominal Pain    HPI [name] is a 85 y.o. male with below list of previous medical conditions including CAD CHF hypertension and renal failure presents to the emergency department secondary to epigastric abdominal pain x1 week with acute worsening over the last 24 hours.  Patient also admits to dark stools.  Patient also admits to discomfort with urination.  Patient denies any fever afebrile on presentation.  Patient denies any nausea, vomiting or diarrhea.        Past Medical History:  Diagnosis Date   Cardiac disease    CHF (congestive heart failure) ([place])    Hypercholesteremia    Hypertension    Kidney failure    left   Osteoporosis    Prostate cancer ([place])    Shingles    Skin cancer    right cheek    Patient Active Problem List   Diagnosis Date Noted   Weakness    Elevated LFTs    Chronic kidney disease, stage 4 (severe) (HCC)    Aortic atherosclerosis ([place])    Bilateral nephrolithiasis    Hydronephrosis concurrent with and due to calculi of kidney and ureter    Acute encephalopathy 03/08/2018   Gallbladder abscess 07/31/2016   Cholecystitis 07/20/2016   Hematuria 06/25/2016   Sepsis ([place]) 06/25/2016   Acute cholecystitis    Hypokalemia 07/23/2015   Generalized weakness 07/23/2015   Bladder outlet obstruction 07/23/2015   Acute delirium 07/23/2015   Benign essential HTN 07/23/2015   Acute on chronic renal failure ([place]) 07/20/2015   GIB (gastrointestinal bleeding) 06/03/2015   Kidney stone 05/25/2012   Ureteric stone 05/25/2012   Hydronephrosis 05/25/2012   Chronic kidney disease, stage IV (severe) ([place]) 05/25/2012    Past Surgical  History:  Procedure Laterality Date   CARDIAC CATHETERIZATION  04/13/2014   CT PERC CHOLECYSTOSTOMY  07/20/2016   Placed at [facility] Interventional Radiology    Prior to Admission medications   Medication Sig Start Date End Date Taking? Authorizing Provider  Acetaminophen (TYLENOL PO) Take 1-2 tablets by mouth daily as needed (pain/headache).    [provider]  aspirin EC 81 MG tablet Take 81 mg by mouth at bedtime.    [provider]  atorvastatin (LIPITOR) 40 MG tablet Take 40 mg by mouth at bedtime.     [provider]  calcitRIOL (ROCALTROL) 0.25 MCG capsule Take 0.25 mcg by mouth See admin instructions. Take one capsule (0.25 mcg) by mouth every Monday, Wednesday, Friday night 02/06/18   [provider]  Cholecalciferol (VITAMIN D3) 5000 units TABS Take 5,000 Units by mouth daily.    [provider]  docusate sodium (COLACE) 100 MG capsule Take 1 capsule (100 mg total) by mouth 2 (two) times daily. Patient not taking: Reported on 03/08/2018 07/23/16   Gladstone Lighter, MD  doxazosin (CARDURA) 4 MG tablet Take 4 mg by mouth at bedtime.     [provider]  feeding supplement (BOOST / RESOURCE BREEZE) LIQD Take 1 Container by mouth 2 (two) times daily between meals. Patient not taking: Reported on 03/08/2018 07/23/16   Gladstone Lighter, MD  ferrous sulfate 325 (65 FE) MG tablet Take 325 mg by mouth 2 (two) times daily.     [provider]  isosorbide mononitrate (IMDUR) 30 MG 24 hr tablet Take 1 tablet (30 mg total) by mouth daily. 03/11/18   Bloomfield, Carley D, DO  levothyroxine (SYNTHROID, LEVOTHROID) 88 MCG tablet Take 88 mcg by mouth daily. 02/11/18   [provider]  metoprolol succinate (TOPROL-XL) 50 MG 24 hr tablet Take 50 mg by mouth daily. Take with or immediately following a meal.    [provider]  Omega-3 Fatty Acids (FISH OIL) 1200 MG CAPS Take 1,200 mg by mouth 2 (two) times daily.    [provider]  pantoprazole (PROTONIX) 40 MG tablet Take 1 tablet (40 mg total) by mouth 2 (two) times daily. 03/10/18   Bloomfield, Carley D, DO  potassium chloride SA (K-DUR,KLOR-CON) 20 MEQ tablet Take 20 mEq by mouth 2 (two) times daily.  08/18/15   [provider]  sodium bicarbonate 650 MG tablet Take 1,300 mg by mouth 2 (two) times daily.     [provider]  tamsulosin (FLOMAX) 0.4 MG CAPS capsule Take 0.4 mg by mouth at bedtime.     [provider]  torsemide (DEMADEX) 20 MG tablet Take 40 mg by mouth daily.  10/27/15   [provider]    Allergies Ativan [lorazepam] and Penicillin g  Family History  Problem Relation Age of Onset   CAD Mother    Cancer Mother    Heart attack Father     Social History Social History   Tobacco Use   Smoking status: Former Smoker    Types: Cigarettes, Cigars   Smokeless tobacco: Current User    Types: Chew  Substance Use Topics   Alcohol use: No    Alcohol/week: 0.0 standard drinks   Drug use: No    Review of Systems Constitutional: No fever/chills Eyes: No visual changes. ENT: No sore throat. Cardiovascular: Denies chest pain. Respiratory: Denies shortness of breath. Gastrointestinal: No abdominal pain.  No nausea, no vomiting.  No diarrhea.  No constipation. Genitourinary: Positive for dysuria. Musculoskeletal: Negative for neck pain.  Negative for back pain. Integumentary: Negative for rash. Neurological: Negative for headaches, focal weakness or numbness.   ____________________________________________   PHYSICAL EXAM:  VITAL SIGNS: ED Triage Vitals  Enc Vitals Group     BP 03/31/19 0400 (!) 138/93     Pulse Rate 03/31/19 0400 93     Resp 03/31/19 0400 (!) 23     Temp 03/31/19 0403 99.7 F (37.6 C)     Temp Source 03/31/19 0403 Oral     SpO2 03/31/19 0359 100 %     Weight 03/31/19 0403 67 kg (147 lb 11.3 oz)     Height 03/31/19 0403 1.727 m (5\' 8" )     Head Circumference --       Peak Flow --      Pain Score 03/31/19 0403 7     Pain Loc --      Pain Edu? --      Excl. in [place]? --     Constitutional: Alert and oriented.  Eyes: Conjunctivae are normal.  Mouth/Throat: Mucous membranes are moist. Neck: No stridor.  No meningeal signs.   Cardiovascular: Normal rate, regular rhythm. Good peripheral circulation. Grossly normal heart sounds. Respiratory: Normal respiratory effort.  No retractions. Gastrointestinal: Epigastric tenderness to palpation. No distention.  Guaiac negative Musculoskeletal: No lower extremity tenderness nor edema. No gross deformities of extremities. Neurologic:  Normal speech and language. No gross focal neurologic deficits are appreciated.  Skin:  Skin is warm,  dry and intact. Psychiatric: Mood and affect are normal. Speech and behavior are normal.  ____________________________________________   LABS (all labs ordered are listed, but only abnormal results are displayed)  Labs Reviewed  COMPREHENSIVE METABOLIC PANEL - Abnormal; Notable for the following components:      Result Value   Sodium 133 (*)    CO2 17 (*)    Glucose, Bld 113 (*)    BUN 43 (*)    Creatinine, Ser 3.75 (*)    Calcium 8.0 (*)    Total Protein 6.0 (*)    Albumin 2.9 (*)    AST 326 (*)    ALT 230 (*)    Alkaline Phosphatase 242 (*)    Total Bilirubin 1.9 (*)    GFR calc non Af Amer 13 (*)    GFR calc Af Amer 16 (*)    Anion gap 16 (*)    All other components within normal limits  CBC WITH DIFFERENTIAL/PLATELET - Abnormal; Notable for the following components:   RBC 3.81 (*)    Hemoglobin 11.5 (*)    HCT 35.6 (*)    Platelets 103 (*)    Lymphs Abs 0.2 (*)    All other components within normal limits  SARS CORONAVIRUS 2 (HOSPITAL ORDER, [place] [facility])  PROTIME-INR  URINALYSIS, COMPLETE (UACMP) WITH MICROSCOPIC  HEMOGLOBIN AND HEMATOCRIT, BLOOD  TYPE AND SCREEN   ____________________________________________  EKG  ED ECG  REPORT I, [place] N [place] Perra, the attending physician, personally viewed and interpreted this ECG.   Date: 03/31/2019  EKG Time: 3:59 AM  Rate: 105  Rhythm: Sinus tachycardia with premature ventricular contraction  Axis: Normal  Intervals: Normal  ST&T Change: None  ____________________________________________  RADIOLOGY I, [place] N Nakyiah Kuck, personally viewed and evaluated these images (plain radiographs) as part of my medical decision making, as well as reviewing the written report by the radiologist.  ED MD interpretation: Possible cystitis,, large hiatal hernia  Official radiology report(s): Ct Abdomen Pelvis Wo Contrast  Result Date: 03/31/2019 CLINICAL DATA:  Acute generalized abdominal pain. EXAM: CT ABDOMEN AND PELVIS WITHOUT CONTRAST TECHNIQUE: Multidetector CT imaging of the abdomen and pelvis was performed following the standard protocol without IV contrast. COMPARISON:  03/08/2018 FINDINGS: Lower chest: Sizable hiatal hernia. Chronic trace pleural fluid at the left base. Coronary calcification. Hepatobiliary: No focal liver abnormality.No evidence of biliary obstruction or stone. Pancreas: Generalized atrophy. Spleen: Unremarkable. Adrenals/Urinary Tract: Negative adrenals. Chronic left hydroureteronephrosis due to distal ureteral calculus with severe left renal atrophy. The left kidney is likely nonfunctional. 15 mm left interpolar calculus. No right hydronephrosis. Thick walled bladder with subtle cellule/diverticulum. Mild perivesicular fat haziness. Stomach/Bowel:  No obstruction. Left colonic diverticulosis. Vascular/Lymphatic: No acute vascular abnormality. No mass or adenopathy. Haziness of fat in the sigmoid mesentery without mass or adenopathy-consistent with chronic mesenteric panniculitis. Reproductive:No acute finding Other: No ascites or pneumoperitoneum. Musculoskeletal: No acute abnormalities. Osteopenia and spinal degeneration IMPRESSION: 1. Possible cystitis. 2. Finding  suggesting chronic bladder outlet obstruction. There is only mild bladder distention currently. 3. 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 4. Large hiatal hernia. Electronically Signed   By: Monte Fantasia M.D.   On: 03/31/2019 06:24   Dg Chest Portable 1 View  Result Date: 03/31/2019 CLINICAL DATA:  Cough and dyspnea EXAM: PORTABLE CHEST 1 VIEW COMPARISON:  03/08/2018 chest x-ray and abdominal CT FINDINGS: Apparent cardiomegaly is primarily related to a large hiatal hernia based on prior. There is also scarring  at the left lower lobe. Interstitial crowding at the bases. No edema, effusion, or pneumothorax. Study is limited by leftward rotation. IMPRESSION: 1. Low volume chest with atelectasis at the bases. 2. Large hiatal hernia with overlying scar/atelectasis limiting assessment of the left base. Electronically Signed   By: Monte Fantasia M.D.   On: 03/31/2019 05:06      Procedures   ____________________________________________   INITIAL IMPRESSION / MDM / ASSESSMENT AND PLAN / ED COURSE  As part of my medical decision making, I reviewed the following data within the [place] NUMBER   85 year old male presented with above-stated history and physical exam secondary to epigastric abdominal pain urinary discomfort and dark stools.  Consider possibility of ulcer disease versus choledocholithiasis cholelithiasis with CT abdomen pelvis performed which revealed no evidence of choledocholithiasis or cholelithiasis.  Regarding the patient's dark stools guaiac performed which was negative.  Patient H&H 11.5 and 35.6 respectively will repeat H&H now.  Regarding the patient's urinary discomfort awaiting urinalysis results at this time.  Patient's repeat hemoglobin decreased to 10.4 and as such plan for hospital admission for further evaluation and management for possible GI bleed.  Patient discussed with Dr. Marcille Blanco hospitalist       ____________________________________________  FINAL CLINICAL IMPRESSION(S) / ED DIAGNOSES  Final diagnoses:  RUQ pain  Hiatal hernia     MEDICATIONS GIVEN DURING THIS VISIT:  Medications - No data to display   ED Discharge Orders    None      *Please note:  [name] was evaluated in Emergency Department on 03/31/2019 for the symptoms described in the history of present illness. He was evaluated in the context of the global COVID-19 pandemic, which necessitated consideration that the patient might be at risk for infection with the SARS-CoV-2 virus that causes COVID-19. Institutional protocols and algorithms that pertain to the evaluation of patients at risk for COVID-19 are in a state of rapid change based on information released by regulatory bodies including the CDC and federal and state organizations. These policies and algorithms were followed during the patient's care in the ED.  Some ED evaluations and interventions may be delayed as a result of limited staffing during the pandemic.*  Note:  This document was prepared using Dragon voice recognition software and may include unintentional dictation errors.   Gregor Hams, MD 03/31/19 2239

## 2019-03-31 NOTE — TOC Initial Note (Signed)
Transition of Care [facility]) - Initial/Assessment Note    Patient Details  Name: [name] MRN: [id number] Date of Birth: 1927/05/27  Transition of Care [facility]) CM/SW Contact:    Fredric Mare, LCSW Phone Number: 03/31/2019, 10:17 AM  Clinical Narrative:                  Patient is an 85 year old male that presents to the ED for abdominal pain. Patient's daughter in law is at bedside. She states that patient is feeble and has difficulty getting around, and need helps going to the bathroom. Patient's grandson stays with patient and his wife at night, and patient's wife help to take care of patient during the day.  Patient's daughter in law would like to receive home health services for PT and nurse aide, and chose [facility]. CSW contacted Floydene Flock with [facility], and asked MD to order face to face and home health.     Expected Discharge Plan: [place]     Patient Goals and CMS Choice        Expected Discharge Plan and Services Expected Discharge Plan: [place]   Discharge Planning Services: CM Consult   Living arrangements for the past 2 months: Single Family Home                           HH Arranged: PT, Nurse's Aide [place] Agency: [place] ([place]) Date HH Agency Contacted: 03/31/19 Time HH Agency Contacted: 1000 Representative spoke with at [place]: Floydene Flock  Prior Living Arrangements/Services Living arrangements for the past 2 months: [place] Lives with:: Spouse Patient language and need for interpreter reviewed:: Yes Do you feel safe going back to the place where you live?: Yes      Need for Family Participation in Patient Care: Yes (Comment) Care giver support system in place?: Yes (comment)   Criminal Activity/Legal Involvement Pertinent to Current Situation/Hospitalization: No - Comment as needed  Activities of Daily Living      Permission Sought/Granted                   Emotional Assessment Appearance:: Appears stated age Attitude/Demeanor/Rapport: Unable to Assess     Alcohol / Substance Use: Tobacco Use(chews tobacco) Psych Involvement: No (comment)  Admission diagnosis:  Abdominal Pain Patient Active Problem List   Diagnosis Date Noted  . Chronic diastolic (congestive) heart failure ([place]) 03/31/2019  . Malignant neoplasm of prostate ([place]) 03/31/2019  . Weakness   . Elevated LFTs   . Chronic kidney disease, stage 4 (severe) ([place])   . Aortic atherosclerosis ([place])   . Bilateral nephrolithiasis   . Hydronephrosis concurrent with and due to calculi of kidney and ureter   . Acute encephalopathy 03/08/2018  . Gallbladder abscess 07/31/2016  . Cholecystitis 07/20/2016  . Hematuria 06/25/2016  . Sepsis ([place]) 06/25/2016  . Acute cholecystitis   . Hypokalemia 07/23/2015  . Generalized weakness 07/23/2015  . Bladder outlet obstruction 07/23/2015  . Acute delirium 07/23/2015  . Benign essential HTN 07/23/2015  . Acute on chronic renal failure ([place]) 07/20/2015  . GIB (gastrointestinal bleeding) 06/03/2015  . Kidney stone 05/25/2012  . Ureteric stone 05/25/2012  . Hydronephrosis 05/25/2012  . Chronic kidney disease, stage IV (severe) ([place]) 05/25/2012   PCP:  Care, [place] Pharmacy:   [place] [place]  [place] [address] Phone: [phone] Fax: [phone]  [id number] - [place], [place] - [place] AT [place] [address] [place] [address] Phone: [phone] Fax: [phone]  [id number] [place] [place] [address] [place] [address] Phone: [phone] Fax: [phone]  [id number] [place], [place] [address] Phone: [phone] Fax: [phone]     Social Determinants of  Health (SDOH) Interventions    Readmission Risk Interventions No flowsheet data found.

## 2019-03-31 NOTE — ED Notes (Signed)
Patients daughter in law at bedside.

## 2019-03-31 NOTE — Consult Note (Signed)
[place] [facility] [place] NAME: [name]    MR#:  [id number]  DATE OF BIRTH:  May 25, 1927  DATE OF ADMISSION:  03/31/2019  PRIMARY CARE PHYSICIAN: Care, [facility]   REQUESTING/REFERRING PHYSICIAN: Dr. Marjean Donna  CHIEF COMPLAINT:   Epigastric abdominal pain last night  History is obtained from patient's daughter in law in the ER. Patient did give some history however a poor historian HISTORY OF PRESENT ILLNESS:  [name]  is a 85 y.o. male with a known history of chronic diastolic congestive heart failure, hypercholesterolemia, hypertension, history of prostate cancer, CKD stage III with nephrology asses and chronic left hydronephrosis, chronically elevated LFTs comes to the emergency room after he started having bilateral flank pain last night which progress to having more pain in the epigastric area. There is no vomiting nausea. Patient did say he was having black tarry stools. Heme occult tested by RN in the ER was negative. During my evaluation patient is hemodynamically stable. He does not have any pain. He feels back to baseline. Medicine was consulted to see if patient could be admitted for further evaluation management.  Daughter in law in the ER room.  Patient at baseline needs help ambulating to the bathroom. He has a sedentary lifestyle mostly in the recliner/chair. Grandson helps during the nighttime. Family is in and out to help patient's wife take care of patient.  PAST MEDICAL HISTORY:   Past Medical History:  Diagnosis Date  . Cardiac disease   . CHF (congestive heart failure) ([place])   . Hypercholesteremia   . Hypertension   . Kidney failure    left  . Osteoporosis   . Prostate cancer ([place])   . Shingles   . Skin cancer    right cheek    PAST SURGICAL HISTOIRY:   Past Surgical History:  Procedure Laterality Date  . CARDIAC CATHETERIZATION  04/13/2014  . CT PERC CHOLECYSTOSTOMY  07/20/2016    Placed at [facility] Interventional Radiology    SOCIAL HISTORY:   Social History   Tobacco Use  . Smoking status: Former Smoker    Types: Cigarettes, Cigars  . Smokeless tobacco: Current User    Types: Chew  Substance Use Topics  . Alcohol use: No    Alcohol/week: 0.0 standard drinks    FAMILY HISTORY:   Family History  Problem Relation Age of Onset  . CAD Mother   . Cancer Mother   . Heart attack Father     DRUG ALLERGIES:   Allergies  Allergen Reactions  . Ativan [Lorazepam] Other (See Comments)    Hallucinations, combative  . Penicillin G Other (See Comments)    Has patient had a PCN reaction causing immediate rash, facial/tongue/throat swelling, SOB or lightheadedness with hypotension: Yes Has patient had a PCN reaction causing severe rash involving mucus membranes or skin necrosis: No Has patient had a PCN reaction that required hospitalization No Has patient had a PCN reaction occurring within the last 10 years: No If all of the above answers are "NO", then may proceed with Cephalosporin use.     REVIEW OF SYSTEMS:  Review of Systems  Constitutional: Negative for chills, fever and weight loss.  HENT: Negative for ear discharge, ear pain and nosebleeds.   Eyes: Negative for blurred vision, pain and discharge.  Respiratory: Negative for sputum production, shortness of breath, wheezing and stridor.   Cardiovascular: Negative for chest pain, palpitations, orthopnea and PND.  Gastrointestinal: Positive for abdominal pain.  Negative for diarrhea, nausea and vomiting.  Genitourinary: Negative for frequency and urgency.  Musculoskeletal: Negative for back pain and joint pain.  Neurological: Positive for weakness. Negative for sensory change, speech change and focal weakness.  Psychiatric/Behavioral: Negative for depression and hallucinations. The patient is not nervous/anxious.      MEDICATIONS AT HOME:   Prior to Admission medications   Medication Sig Start Date End  Date Taking? Authorizing Provider  Acetaminophen (TYLENOL PO) Take 1-2 tablets by mouth daily as needed (pain/headache).    [provider]  aspirin EC 81 MG tablet Take 81 mg by mouth at bedtime.    [provider]  atorvastatin (LIPITOR) 40 MG tablet Take 40 mg by mouth at bedtime.     [provider]  calcitRIOL (ROCALTROL) 0.25 MCG capsule Take 0.25 mcg by mouth See admin instructions. Take one capsule (0.25 mcg) by mouth every Monday, Wednesday, Friday night 02/06/18   [provider]  Cholecalciferol (VITAMIN D3) 5000 units TABS Take 5,000 Units by mouth daily.    [provider]  doxazosin (CARDURA) 4 MG tablet Take 4 mg by mouth at bedtime.     [provider]  ferrous sulfate 325 (65 FE) MG tablet Take 325 mg by mouth 2 (two) times daily.     [provider]  isosorbide mononitrate (IMDUR) 30 MG 24 hr tablet Take 1 tablet (30 mg total) by mouth daily. 03/11/18   Bloomfield, Carley D, DO  levothyroxine (SYNTHROID, LEVOTHROID) 88 MCG tablet Take 88 mcg by mouth daily. 02/11/18   [provider]  metoprolol succinate (TOPROL-XL) 50 MG 24 hr tablet Take 50 mg by mouth daily. Take with or immediately following a meal.    [provider]  Omega-3 Fatty Acids (FISH OIL) 1200 MG CAPS Take 1,200 mg by mouth 2 (two) times daily.    [provider]  pantoprazole (PROTONIX) 40 MG tablet Take 1 tablet (40 mg total) by mouth 2 (two) times daily. 03/10/18   [place]omfield, Carley D, DO  potassium chloride SA (K-DUR,KLOR-CON) 20 MEQ tablet Take 20 mEq by mouth 2 (two) times daily.  08/18/15   [provider]  sodium bicarbonate 650 MG tablet Take 1,300 mg by mouth 2 (two) times daily.     [provider]  tamsulosin (FLOMAX) 0.4 MG CAPS capsule Take 0.4 mg by mouth at bedtime.     [provider]  torsemide (DEMADEX) 20 MG tablet Take 40 mg by mouth daily.  10/27/15   [provider]      VITAL  SIGNS:  Blood pressure 101/63, pulse 81, temperature 99.7 F (37.6 C), temperature source Oral, resp. rate 19, height 5\' 8"  (1.727 m), weight 67 kg, SpO2 94 %.  PHYSICAL EXAMINATION:  GENERAL:  85 y.o.-year-old patient lying in the bed with no acute distress.  EYES: Pupils equal, round, reactive to light and accommodation. No scleral icterus. Extraocular muscles intact.  HEENT: Head atraumatic, normocephalic. Oropharynx and nasopharynx clear.  NECK:  Supple, no jugular venous distention. No thyroid enlargement, no tenderness.  LUNGS: Normal breath sounds bilaterally, no wheezing, rales,rhonchi or crepitation. No use of accessory muscles of respiration.  CARDIOVASCULAR: S1, S2 normal. No murmurs, rubs, or gallops.  ABDOMEN: Soft, nontender, nondistended. Bowel sounds present. No organomegaly or mass.  EXTREMITIES: No pedal edema, cyanosis, or clubbing.  NEUROLOGIC: Cranial nerves II through XII are intact. Muscle strength 5/5 in all extremities. Sensation intact. Gait not checked. weak PSYCHIATRIC: The patient is alert and  oriented x 3.  SKIN: No obvious rash, lesion, or ulcer.   LABORATORY PANEL:   CBC Recent Labs  Lab 03/31/19 0408 03/31/19 0637  WBC 4.7  --   HGB 11.5* 10.4*  HCT 35.6* 31.0*  PLT 103*  --    ------------------------------------------------------------------------------------------------------------------  Chemistries  Recent Labs  Lab 03/31/19 0408  NA 133*  K 4.3  CL 100  CO2 17*  GLUCOSE 113*  BUN 43*  CREATININE 3.75*  CALCIUM 8.0*  AST 326*  ALT 230*  ALKPHOS 242*  BILITOT 1.9*   ------------------------------------------------------------------------------------------------------------------  Cardiac Enzymes No results for input(s): TROPONINI in the last 168 hours. ------------------------------------------------------------------------------------------------------------------  RADIOLOGY:  Ct Abdomen Pelvis Wo Contrast  Result Date:  03/31/2019 CLINICAL DATA:  Acute generalized abdominal pain. EXAM: CT ABDOMEN AND PELVIS WITHOUT CONTRAST TECHNIQUE: Multidetector CT imaging of the abdomen and pelvis was performed following the standard protocol without IV contrast. COMPARISON:  03/08/2018 FINDINGS: Lower chest: Sizable hiatal hernia. Chronic trace pleural fluid at the left base. Coronary calcification. Hepatobiliary: No focal liver abnormality.No evidence of biliary obstruction or stone. Pancreas: Generalized atrophy. Spleen: Unremarkable. Adrenals/Urinary Tract: Negative adrenals. Chronic left hydroureteronephrosis due to distal ureteral calculus with severe left renal atrophy. The left kidney is likely nonfunctional. 15 mm left interpolar calculus. No right hydronephrosis. Thick walled bladder with subtle cellule/diverticulum. Mild perivesicular fat haziness. Stomach/Bowel:  No obstruction. Left colonic diverticulosis. Vascular/Lymphatic: No acute vascular abnormality. No mass or adenopathy. Haziness of fat in the sigmoid mesentery without mass or adenopathy-consistent with chronic mesenteric panniculitis. Reproductive:No acute finding Other: No ascites or pneumoperitoneum. Musculoskeletal: No acute abnormalities. Osteopenia and spinal degeneration IMPRESSION: 1. Possible cystitis. 2. Finding suggesting chronic bladder outlet obstruction. There is only mild bladder distention currently. 3. 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 4. Large hiatal hernia. Electronically Signed   By: Monte Fantasia M.D.   On: 03/31/2019 06:24   Dg Chest Portable 1 View  Result Date: 03/31/2019 CLINICAL DATA:  Cough and dyspnea EXAM: PORTABLE CHEST 1 VIEW COMPARISON:  03/08/2018 chest x-ray and abdominal CT FINDINGS: Apparent cardiomegaly is primarily related to a large hiatal hernia based on prior. There is also scarring at the left lower lobe. Interstitial crowding at the bases. No edema, effusion, or pneumothorax. Study is  limited by leftward rotation. IMPRESSION: 1. Low volume chest with atelectasis at the bases. 2. Large hiatal hernia with overlying scar/atelectasis limiting assessment of the left base. Electronically Signed   By: Monte Fantasia M.D.   On: 03/31/2019 05:06   US Abdomen Limited Ruq  Result Date: 03/31/2019 CLINICAL DATA:  Right upper quadrant pain.  Elevated LFTs. EXAM: ULTRASOUND ABDOMEN LIMITED RIGHT UPPER QUADRANT COMPARISON:  CT 03/31/2019. FINDINGS: Gallbladder: No gallstones or wall thickening visualized. No sonographic Murphy sign noted by sonographer. Common bile duct: Diameter: 5.4 mm Liver: No focal lesion identified. Within normal limits in parenchymal echogenicity. Portal vein is patent on color Doppler imaging with normal direction of blood flow towards the liver. Other: None. IMPRESSION: No acute abnormality. No gallstones or biliary distention. Liver appears normal. Electronically Signed   By: Marcello Moores  Register   On: 03/31/2019 07:41    EKG:   Sinus rhythm on tele monitor IMPRESSION AND PLAN:   [name]  is a 85 y.o. male with a known history of chronic diastolic congestive heart failure, hypercholesterolemia, hypertension, history of prostate cancer, CKD stage III with nephrology asses and chronic left hydronephrosis, chronically elevated LFTs comes to the emergency room after he  started having bilateral flank pain last night which progress to having more pain in the epigastric area. There is no vomiting nausea.  1. Abdominal pain epigastric and initially started with bilateral flank pain which now has resolved -differential diagnosis appears gastritis/Gerd in the setting of large hiatal hernia, UTI -patient is heme occult stools is negative tested by RN in the ER -his hemoglobin was 11.5-- repeat is 10.4 -no active bleeding noted in the ER. Vitals are relatively stable. -Workup with CT scan shows chronic left hydronephrosis with nephrology asses and chronic ureteral obstruction  with left kidney atrophy -patient's creatinine is at baseline 3.7 -no fever, COVID negative -LFTs chronically elevated and appears at baseline -urine analysis pending -discussed with patient's daughter in law regarding overnight observation and have G.I. see patient regarding his epigastric abdominal pain. -Daughter-in-law mentioned given patient's advanced age, no complains of abdominal pain at present and overall although workup so far has been essentially negative does not feel to put patient through any invasive procedure. -She will discussed with her husband and get in touch with me whether they have decided to take patient home. -She is interested for social worker to see before she leaves to see if she can get additional help at home with the patient. -Social worker consultation placed  2. CKD stage IV with history of chronic left kidney and atrophy and nephrology asses with distal obstructive neuropathy and chronic bladder outlet obstruction as noted on CT abdomen -creatinine is stable at 3.7  3. Chronically elevated LFTs -ultrasound abdomen negative  4. Hypertension -related low blood pressure however patient asymptomatic  5. History of prostate cancer status post radiation in the past  Will await urinalysis results, discussion with social worker by family and then decision to see if patient wants to be admitted.  This was discussed with Dr. Royden Purl in the ER.  All the records are reviewed and case discussed with ED provider.   CODE STATUS: full  TOTAL TIME TAKING CARE OF THIS PATIENT: *50* minutes.    Fritzi Mandes M.D on 03/31/2019 at 8:06 AM  Between 7am to 6pm - Pager - [phone]  After 6pm go to [email or web address] - password [email or web address]  [facility]  Office  [phone]  CC: Primary care physician; Care, [place]

## 2019-03-31 NOTE — ED Notes (Signed)
Spoke with admitting doctor, waiting on social work to determine if pt will be admitted for observation.

## 2019-03-31 NOTE — ED Notes (Signed)
Pt to be discharged home, family to transport.

## 2019-03-31 NOTE — ED Provider Notes (Signed)
7:00 AM Assuming care from Dr. Owens Shark.    In short, [name] is a 85 y.o. male with history of CAD, CHF, HTN, CKD, hiatal hernia who presented for epigastric pain, dark stools (guiaic negative here) and urinary discomfort.  Refer to the original H&P for additional details.  Work-up revealed (chronically) elevated creatinine, near baseline, (chronically) elevated LFTs, near baseline as well. CT with possible cystitis, but UA negative for infection, sent for culture. Hiatal hernia noted. US abdomen w/ no acute abnormalities.   As above, patient guaiac negative here. Initial hemoglobin 11.5, on repeat was 10.4. Initial plan was for admission for further evaluation with possible scope/GI consult.   However, daughter in law at bedside now stating they would not want to pursue admission or urgent/emergent colonoscopy at this time. She prefers to be discharged and follow up with GI as outpatient. She understands that going home could mean that he could continue to bleed, and as a result worsen clinically. She states they will return for any new or worsening symptoms. Patient will take 40 mg PPI BID. Hospitalist has seen and evaluated and agree with plan. Social work consulted for additional resources at home.   Will plan for discharge, discussed very strict return precautions. Daughter in law voices understanding.     Lilia Pro., MD 03/31/19 [phone]

## 2019-03-31 NOTE — Progress Notes (Addendum)
Patient ID: [name], male   DOB: 11-07-1926, 85 y.o.   MRN: [id number]  Patient does not have any abdominal pain at present. He doesn't want to eat because he is tired from being in the ER all night. Spoke with daughter in law Ms. Apple and she is okay with taking patient home. Social worker met with family and I will sign the home health orders. Patient will follow-up with G.I. as needed. She he will follow-up with PCP as outpatient. Discharge plan was discussed with daughter in law all questions answered. Patient's UA is negative for UTI.  Patient has generalized weakness. Will arrange home health PT and aid. He has a elderly wife who helps him to maneuver around if needed. And grandson lives during the nighttime. Above was discussed with RN.

## 2019-03-31 NOTE — ED Notes (Signed)
Patient has negative fecal occult

## 2019-03-31 NOTE — ED Triage Notes (Signed)
Patient arrives from home with wife with bilateral upper quadrant pain starting approx 1 week ago but worsening over last 24h. Patient has history of GI issues; reports black tarry stool. EMS reports patient was having "urine difficulties", patient denies

## 2019-03-31 NOTE — ED Notes (Signed)
D/C NPO orders per Dr. Posey Pronto. Pt declined food at this time. Pt resting comfortably at this time.

## 2019-04-01 LAB — URINE CULTURE: Culture: NO GROWTH

## 2019-04-02 ENCOUNTER — Emergency Department: Payer: Medicare Other

## 2019-04-02 ENCOUNTER — Inpatient Hospital Stay
Admission: EM | Admit: 2019-04-02 | Discharge: 2019-04-07 | DRG: 872 | Disposition: A | Discharge status: Skilled Nursing Facility | Payer: Medicare Other | Attending: Internal Medicine | Admitting: Internal Medicine

## 2019-04-02 ENCOUNTER — Other Ambulatory Visit: Payer: Self-pay

## 2019-04-02 ENCOUNTER — Inpatient Hospital Stay: Payer: Medicare Other

## 2019-04-02 DIAGNOSIS — T501X5A Adverse effect of loop [high-ceiling] diuretics, initial encounter: Secondary | ICD-10-CM | POA: Diagnosis not present

## 2019-04-02 DIAGNOSIS — Z8546 Personal history of malignant neoplasm of prostate: Secondary | ICD-10-CM

## 2019-04-02 DIAGNOSIS — N2581 Secondary hyperparathyroidism of renal origin: Secondary | ICD-10-CM | POA: Diagnosis present

## 2019-04-02 DIAGNOSIS — I130 Hypertensive heart and chronic kidney disease with heart failure and stage 1 through stage 4 chronic kidney disease, or unspecified chronic kidney disease: Secondary | ICD-10-CM | POA: Diagnosis present

## 2019-04-02 DIAGNOSIS — A4151 Sepsis due to Escherichia coli [E. coli]: Secondary | ICD-10-CM | POA: Diagnosis present

## 2019-04-02 DIAGNOSIS — K8030 Calculus of bile duct with cholangitis, unspecified, without obstruction: Secondary | ICD-10-CM | POA: Diagnosis present

## 2019-04-02 DIAGNOSIS — E872 Acidosis: Secondary | ICD-10-CM | POA: Diagnosis present

## 2019-04-02 DIAGNOSIS — Z8719 Personal history of other diseases of the digestive system: Secondary | ICD-10-CM | POA: Diagnosis not present

## 2019-04-02 DIAGNOSIS — D631 Anemia in chronic kidney disease: Secondary | ICD-10-CM | POA: Diagnosis present

## 2019-04-02 DIAGNOSIS — D61818 Other pancytopenia: Secondary | ICD-10-CM

## 2019-04-02 DIAGNOSIS — A419 Sepsis, unspecified organism: Secondary | ICD-10-CM | POA: Diagnosis not present

## 2019-04-02 DIAGNOSIS — E7800 Pure hypercholesterolemia, unspecified: Secondary | ICD-10-CM | POA: Diagnosis present

## 2019-04-02 DIAGNOSIS — R748 Abnormal levels of other serum enzymes: Secondary | ICD-10-CM | POA: Diagnosis not present

## 2019-04-02 DIAGNOSIS — N184 Chronic kidney disease, stage 4 (severe): Secondary | ICD-10-CM | POA: Diagnosis present

## 2019-04-02 DIAGNOSIS — E785 Hyperlipidemia, unspecified: Secondary | ICD-10-CM | POA: Diagnosis present

## 2019-04-02 DIAGNOSIS — Z85828 Personal history of other malignant neoplasm of skin: Secondary | ICD-10-CM

## 2019-04-02 DIAGNOSIS — N179 Acute kidney failure, unspecified: Secondary | ICD-10-CM | POA: Diagnosis present

## 2019-04-02 DIAGNOSIS — Z515 Encounter for palliative care: Secondary | ICD-10-CM | POA: Diagnosis not present

## 2019-04-02 DIAGNOSIS — Z7189 Other specified counseling: Secondary | ICD-10-CM | POA: Diagnosis not present

## 2019-04-02 DIAGNOSIS — N189 Chronic kidney disease, unspecified: Secondary | ICD-10-CM | POA: Diagnosis not present

## 2019-04-02 DIAGNOSIS — Z20828 Contact with and (suspected) exposure to other viral communicable diseases: Secondary | ICD-10-CM | POA: Diagnosis present

## 2019-04-02 DIAGNOSIS — E876 Hypokalemia: Secondary | ICD-10-CM | POA: Diagnosis not present

## 2019-04-02 DIAGNOSIS — E440 Moderate protein-calorie malnutrition: Secondary | ICD-10-CM | POA: Insufficient documentation

## 2019-04-02 DIAGNOSIS — R7881 Bacteremia: Secondary | ICD-10-CM | POA: Diagnosis not present

## 2019-04-02 DIAGNOSIS — K449 Diaphragmatic hernia without obstruction or gangrene: Secondary | ICD-10-CM | POA: Diagnosis present

## 2019-04-02 DIAGNOSIS — Z7982 Long term (current) use of aspirin: Secondary | ICD-10-CM

## 2019-04-02 DIAGNOSIS — Z79890 Hormone replacement therapy (postmenopausal): Secondary | ICD-10-CM

## 2019-04-02 DIAGNOSIS — R509 Fever, unspecified: Secondary | ICD-10-CM

## 2019-04-02 DIAGNOSIS — E538 Deficiency of other specified B group vitamins: Secondary | ICD-10-CM | POA: Diagnosis present

## 2019-04-02 DIAGNOSIS — R945 Abnormal results of liver function studies: Secondary | ICD-10-CM | POA: Diagnosis not present

## 2019-04-02 DIAGNOSIS — K8050 Calculus of bile duct without cholangitis or cholecystitis without obstruction: Secondary | ICD-10-CM | POA: Diagnosis not present

## 2019-04-02 DIAGNOSIS — N1330 Unspecified hydronephrosis: Secondary | ICD-10-CM | POA: Diagnosis present

## 2019-04-02 DIAGNOSIS — K8309 Other cholangitis: Secondary | ICD-10-CM | POA: Diagnosis not present

## 2019-04-02 DIAGNOSIS — Z79899 Other long term (current) drug therapy: Secondary | ICD-10-CM | POA: Diagnosis not present

## 2019-04-02 DIAGNOSIS — E039 Hypothyroidism, unspecified: Secondary | ICD-10-CM | POA: Diagnosis present

## 2019-04-02 DIAGNOSIS — D696 Thrombocytopenia, unspecified: Secondary | ICD-10-CM | POA: Diagnosis not present

## 2019-04-02 DIAGNOSIS — Z23 Encounter for immunization: Secondary | ICD-10-CM

## 2019-04-02 DIAGNOSIS — Z923 Personal history of irradiation: Secondary | ICD-10-CM

## 2019-04-02 DIAGNOSIS — B9620 Unspecified Escherichia coli [E. coli] as the cause of diseases classified elsewhere: Secondary | ICD-10-CM | POA: Diagnosis not present

## 2019-04-02 DIAGNOSIS — K7460 Unspecified cirrhosis of liver: Secondary | ICD-10-CM | POA: Diagnosis present

## 2019-04-02 DIAGNOSIS — R350 Frequency of micturition: Secondary | ICD-10-CM | POA: Diagnosis not present

## 2019-04-02 DIAGNOSIS — I2510 Atherosclerotic heart disease of native coronary artery without angina pectoris: Secondary | ICD-10-CM | POA: Diagnosis present

## 2019-04-02 DIAGNOSIS — I5032 Chronic diastolic (congestive) heart failure: Secondary | ICD-10-CM | POA: Diagnosis present

## 2019-04-02 DIAGNOSIS — R17 Unspecified jaundice: Secondary | ICD-10-CM

## 2019-04-02 DIAGNOSIS — I509 Heart failure, unspecified: Secondary | ICD-10-CM | POA: Diagnosis not present

## 2019-04-02 DIAGNOSIS — M810 Age-related osteoporosis without current pathological fracture: Secondary | ICD-10-CM | POA: Diagnosis present

## 2019-04-02 DIAGNOSIS — D649 Anemia, unspecified: Secondary | ICD-10-CM | POA: Diagnosis not present

## 2019-04-02 DIAGNOSIS — D72819 Decreased white blood cell count, unspecified: Secondary | ICD-10-CM | POA: Diagnosis not present

## 2019-04-02 DIAGNOSIS — Z87891 Personal history of nicotine dependence: Secondary | ICD-10-CM

## 2019-04-02 DIAGNOSIS — K222 Esophageal obstruction: Secondary | ICD-10-CM

## 2019-04-02 DIAGNOSIS — T471X5A Adverse effect of other antacids and anti-gastric-secretion drugs, initial encounter: Secondary | ICD-10-CM | POA: Diagnosis not present

## 2019-04-02 DIAGNOSIS — R0602 Shortness of breath: Secondary | ICD-10-CM

## 2019-04-02 DIAGNOSIS — K831 Obstruction of bile duct: Secondary | ICD-10-CM | POA: Diagnosis not present

## 2019-04-02 LAB — CBC WITH DIFFERENTIAL/PLATELET
Abs Immature Granulocytes: 0.04 10*3/uL (ref 0.00–0.07)
Basophils Absolute: 0 10*3/uL (ref 0.0–0.1)
Basophils Relative: 0 %
Eosinophils Absolute: 0 10*3/uL (ref 0.0–0.5)
Eosinophils Relative: 0 %
HCT: 29 % — ABNORMAL LOW (ref 39.0–52.0)
Hemoglobin: 9.7 g/dL — ABNORMAL LOW (ref 13.0–17.0)
Immature Granulocytes: 2 %
Lymphocytes Relative: 3 %
Lymphs Abs: 0.1 10*3/uL — ABNORMAL LOW (ref 0.7–4.0)
MCH: 30.8 pg (ref 26.0–34.0)
MCHC: 33.4 g/dL (ref 30.0–36.0)
MCV: 92.1 fL (ref 80.0–100.0)
Monocytes Absolute: 0 10*3/uL — ABNORMAL LOW (ref 0.1–1.0)
Monocytes Relative: 2 %
Neutro Abs: 2.5 10*3/uL (ref 1.7–7.7)
Neutrophils Relative %: 93 %
Platelets: 73 10*3/uL — ABNORMAL LOW (ref 150–400)
RBC: 3.15 MIL/uL — ABNORMAL LOW (ref 4.22–5.81)
RDW: 14.6 % (ref 11.5–15.5)
WBC: 2.6 10*3/uL — ABNORMAL LOW (ref 4.0–10.5)
nRBC: 0 % (ref 0.0–0.2)

## 2019-04-02 LAB — COMPREHENSIVE METABOLIC PANEL
ALT: 214 U/L — ABNORMAL HIGH (ref 0–44)
ALT: 189 U/L — ABNORMAL HIGH (ref 0–44)
AST: 217 U/L — ABNORMAL HIGH (ref 15–41)
AST: 177 U/L — ABNORMAL HIGH (ref 15–41)
Albumin: 2.3 g/dL — ABNORMAL LOW (ref 3.5–5.0)
Albumin: 2.2 g/dL — ABNORMAL LOW (ref 3.5–5.0)
Alkaline Phosphatase: 244 U/L — ABNORMAL HIGH (ref 38–126)
Alkaline Phosphatase: 228 U/L — ABNORMAL HIGH (ref 38–126)
Anion gap: 14 (ref 5–15)
Anion gap: 13 (ref 5–15)
BUN: 52 mg/dL — ABNORMAL HIGH (ref 8–23)
BUN: 47 mg/dL — ABNORMAL HIGH (ref 8–23)
CO2: 16 mmol/L — ABNORMAL LOW (ref 22–32)
CO2: 17 mmol/L — ABNORMAL LOW (ref 22–32)
Calcium: 7.4 mg/dL — ABNORMAL LOW (ref 8.9–10.3)
Calcium: 7 mg/dL — ABNORMAL LOW (ref 8.9–10.3)
Chloride: 102 mmol/L (ref 98–111)
Chloride: 105 mmol/L (ref 98–111)
Creatinine, Ser: 3.69 mg/dL — ABNORMAL HIGH (ref 0.61–1.24)
Creatinine, Ser: 3.45 mg/dL — ABNORMAL HIGH (ref 0.61–1.24)
GFR calc Af Amer: 16 mL/min — ABNORMAL LOW (ref >60)
GFR calc Af Amer: 17 mL/min — ABNORMAL LOW (ref >60)
GFR calc non Af Amer: 14 mL/min — ABNORMAL LOW (ref >60)
GFR calc non Af Amer: 15 mL/min — ABNORMAL LOW (ref >60)
Glucose, Bld: 88 mg/dL (ref 70–99)
Glucose, Bld: 76 mg/dL (ref 70–99)
Potassium: 3.4 mmol/L — ABNORMAL LOW (ref 3.5–5.1)
Potassium: 2.9 mmol/L — ABNORMAL LOW (ref 3.5–5.1)
Sodium: 132 mmol/L — ABNORMAL LOW (ref 135–145)
Sodium: 135 mmol/L (ref 135–145)
Total Bilirubin: 4 mg/dL — ABNORMAL HIGH (ref 0.3–1.2)
Total Bilirubin: 4.1 mg/dL — ABNORMAL HIGH (ref 0.3–1.2)
Total Protein: 5 g/dL — ABNORMAL LOW (ref 6.5–8.1)
Total Protein: 4.5 g/dL — ABNORMAL LOW (ref 6.5–8.1)

## 2019-04-02 LAB — BLOOD CULTURE ID PANEL (REFLEXED)

## 2019-04-02 LAB — URINALYSIS, ROUTINE W REFLEX MICROSCOPIC
Bilirubin Urine: NEGATIVE
Glucose, UA: NEGATIVE mg/dL
Ketones, ur: NEGATIVE mg/dL
Leukocytes,Ua: NEGATIVE
Nitrite: NEGATIVE
Protein, ur: NEGATIVE mg/dL
Specific Gravity, Urine: 1.008 (ref 1.005–1.030)
pH: 5 (ref 5.0–8.0)

## 2019-04-02 LAB — PROTIME-INR
INR: 1 (ref 0.8–1.2)
Prothrombin Time: 13.4 seconds (ref 11.4–15.2)

## 2019-04-02 LAB — CBC
HCT: 28 % — ABNORMAL LOW (ref 39.0–52.0)
Hemoglobin: 9.2 g/dL — ABNORMAL LOW (ref 13.0–17.0)
MCH: 30.6 pg (ref 26.0–34.0)
MCHC: 32.9 g/dL (ref 30.0–36.0)
MCV: 93 fL (ref 80.0–100.0)
Platelets: 76 10*3/uL — ABNORMAL LOW (ref 150–400)
RBC: 3.01 MIL/uL — ABNORMAL LOW (ref 4.22–5.81)
RDW: 14.7 % (ref 11.5–15.5)
WBC: 5.9 10*3/uL (ref 4.0–10.5)
nRBC: 0 % (ref 0.0–0.2)

## 2019-04-02 LAB — LIPASE, BLOOD: Lipase: 33 U/L (ref 11–51)

## 2019-04-02 LAB — CORTISOL: Cortisol, Plasma: 82.9 ug/dL

## 2019-04-02 LAB — MAGNESIUM: Magnesium: 1.6 mg/dL — ABNORMAL LOW (ref 1.7–2.4)

## 2019-04-02 LAB — LACTIC ACID, PLASMA
Lactic Acid, Venous: 3.8 mmol/L (ref 0.5–1.9)
Lactic Acid, Venous: 2.6 mmol/L (ref 0.5–1.9)

## 2019-04-02 LAB — APTT: aPTT: 30 seconds (ref 24–36)

## 2019-04-02 LAB — POTASSIUM: Potassium: 4.9 mmol/L (ref 3.5–5.1)

## 2019-04-02 LAB — TROPONIN I (HIGH SENSITIVITY)
Troponin I (High Sensitivity): 60 ng/L — ABNORMAL HIGH (ref <18)
Troponin I (High Sensitivity): 61 ng/L — ABNORMAL HIGH (ref <18)

## 2019-04-02 MED ORDER — VITAMIN D 25 MCG (1000 UNIT) PO TABS
5000.0000 [IU] | ORAL_TABLET | Freq: Every day | ORAL | Status: DC
  Administered 2019-04-02 – 2019-04-07 (×5): 5000 [IU] via ORAL
  Filled 2019-04-02 (×5): qty 5

## 2019-04-02 MED ORDER — ACETAMINOPHEN 500 MG PO TABS
1000.0000 mg | ORAL_TABLET | Freq: Once | ORAL | Status: AC
  Administered 2019-04-02: 1000 mg via ORAL
  Filled 2019-04-02: qty 2

## 2019-04-02 MED ORDER — POTASSIUM CHLORIDE 10 MEQ/100ML IV SOLN
10.0000 meq | INTRAVENOUS | Status: AC
  Administered 2019-04-02 (×4): 10 meq via INTRAVENOUS
  Filled 2019-04-02: qty 100

## 2019-04-02 MED ORDER — TAMSULOSIN HCL 0.4 MG PO CAPS
0.4000 mg | ORAL_CAPSULE | Freq: Every day | ORAL | Status: DC
  Administered 2019-04-02 – 2019-04-06 (×5): 0.4 mg via ORAL
  Filled 2019-04-02 (×5): qty 1

## 2019-04-02 MED ORDER — INFLUENZA VAC A&B SA ADJ QUAD 0.5 ML IM PRSY
0.5000 mL | PREFILLED_SYRINGE | INTRAMUSCULAR | Status: DC
  Filled 2019-04-02: qty 0.5

## 2019-04-02 MED ORDER — SODIUM CHLORIDE 0.9 % IV SOLN
INTRAVENOUS | Status: DC
  Administered 2019-04-02 (×2): via INTRAVENOUS

## 2019-04-02 MED ORDER — SODIUM CHLORIDE 0.9 % IV BOLUS (SEPSIS)
1000.0000 mL | Freq: Once | INTRAVENOUS | Status: AC
  Administered 2019-04-02: 1000 mL via INTRAVENOUS

## 2019-04-02 MED ORDER — SODIUM CHLORIDE 0.9 % IV BOLUS (SEPSIS): 250.0000 mL | Freq: Once | INTRAVENOUS | Status: DC

## 2019-04-02 MED ORDER — SODIUM CHLORIDE 0.9 % IV SOLN
1.0000 g | Freq: Two times a day (BID) | INTRAVENOUS | Status: DC
  Administered 2019-04-02 (×2): 1 g via INTRAVENOUS
  Filled 2019-04-02 (×3): qty 1

## 2019-04-02 MED ORDER — SODIUM CHLORIDE 0.9 % IV SOLN
2.0000 g | Freq: Once | INTRAVENOUS | Status: DC
  Filled 2019-04-02: qty 2

## 2019-04-02 MED ORDER — SODIUM BICARBONATE 650 MG PO TABS
1300.0000 mg | ORAL_TABLET | Freq: Two times a day (BID) | ORAL | Status: DC
  Administered 2019-04-02 – 2019-04-07 (×9): 1300 mg via ORAL
  Filled 2019-04-02 (×9): qty 2

## 2019-04-02 MED ORDER — OMEGA-3-ACID ETHYL ESTERS 1 G PO CAPS
1.0000 g | ORAL_CAPSULE | Freq: Every day | ORAL | Status: DC
  Administered 2019-04-02 – 2019-04-07 (×5): 1 g via ORAL
  Filled 2019-04-02 (×5): qty 1

## 2019-04-02 MED ORDER — LEVOTHYROXINE SODIUM 88 MCG PO TABS
88.0000 ug | ORAL_TABLET | Freq: Every day | ORAL | Status: DC
  Administered 2019-04-03 – 2019-04-07 (×4): 88 ug via ORAL
  Filled 2019-04-02 (×6): qty 1

## 2019-04-02 MED ORDER — ASPIRIN EC 81 MG PO TBEC
81.0000 mg | DELAYED_RELEASE_TABLET | Freq: Every day | ORAL | Status: DC
  Administered 2019-04-02 – 2019-04-06 (×5): 81 mg via ORAL
  Filled 2019-04-02 (×5): qty 1

## 2019-04-02 MED ORDER — MAGNESIUM SULFATE 2 GM/50ML IV SOLN
2.0000 g | Freq: Once | INTRAVENOUS | Status: AC
  Administered 2019-04-02: 2 g via INTRAVENOUS
  Filled 2019-04-02: qty 50

## 2019-04-02 MED ORDER — LEVOTHYROXINE SODIUM 88 MCG PO TABS
88.0000 ug | ORAL_TABLET | Freq: Every day | ORAL | Status: DC
  Administered 2019-04-02: 88 ug via ORAL
  Filled 2019-04-02 (×2): qty 1

## 2019-04-02 MED ORDER — VANCOMYCIN HCL 1.5 G IV SOLR
1500.0000 mg | Freq: Once | INTRAVENOUS | Status: AC
  Administered 2019-04-02: 1500 mg via INTRAVENOUS
  Filled 2019-04-02: qty 1500

## 2019-04-02 MED ORDER — VANCOMYCIN HCL 500 MG IV SOLR: 500.0000 mg | INTRAVENOUS | Status: DC

## 2019-04-02 MED ORDER — PANTOPRAZOLE SODIUM 40 MG PO TBEC
40.0000 mg | DELAYED_RELEASE_TABLET | Freq: Two times a day (BID) | ORAL | Status: DC
  Administered 2019-04-02 – 2019-04-07 (×9): 40 mg via ORAL
  Filled 2019-04-02 (×11): qty 1

## 2019-04-02 MED ORDER — SODIUM CHLORIDE 0.9 % IV SOLN
500.0000 mg | Freq: Two times a day (BID) | INTRAVENOUS | Status: DC
  Administered 2019-04-03 – 2019-04-04 (×2): 500 mg via INTRAVENOUS
  Filled 2019-04-02: qty 500
  Filled 2019-04-02 (×4): qty 0.5

## 2019-04-02 MED ORDER — METRONIDAZOLE IN NACL 5-0.79 MG/ML-% IV SOLN
500.0000 mg | Freq: Once | INTRAVENOUS | Status: DC
Start: 2019-04-02 — End: 2019-04-02
  Filled 2019-04-02: qty 100

## 2019-04-02 MED ORDER — DOXAZOSIN MESYLATE 4 MG PO TABS
4.0000 mg | ORAL_TABLET | Freq: Every day | ORAL | Status: DC
  Administered 2019-04-02 – 2019-04-06 (×5): 4 mg via ORAL
  Filled 2019-04-02 (×6): qty 1

## 2019-04-02 MED ORDER — CALCITRIOL 0.25 MCG PO CAPS
0.2500 ug | ORAL_CAPSULE | ORAL | Status: DC
  Administered 2019-04-03 – 2019-04-06 (×2): 0.25 ug via ORAL
  Filled 2019-04-02 (×2): qty 1

## 2019-04-02 MED ORDER — FERROUS SULFATE 325 (65 FE) MG PO TABS
325.0000 mg | ORAL_TABLET | Freq: Two times a day (BID) | ORAL | Status: DC
  Administered 2019-04-02 – 2019-04-07 (×9): 325 mg via ORAL
  Filled 2019-04-02 (×9): qty 1

## 2019-04-02 MED ORDER — VANCOMYCIN HCL IN DEXTROSE 1-5 GM/200ML-% IV SOLN
1000.0000 mg | Freq: Once | INTRAVENOUS | Status: DC
  Filled 2019-04-02: qty 200

## 2019-04-02 MED ORDER — ISOSORBIDE MONONITRATE ER 30 MG PO TB24
30.0000 mg | ORAL_TABLET | Freq: Every day | ORAL | Status: DC
  Administered 2019-04-02 – 2019-04-07 (×5): 30 mg via ORAL
  Filled 2019-04-02 (×5): qty 1

## 2019-04-02 NOTE — Consult Note (Signed)
PHARMACY CONSULT NOTE - FOLLOW UP  Pharmacy Consult for Electrolyte Monitoring and Replacement   Recent Labs: Potassium (mmol/L)  Date Value  04/02/2019 4.9  03/07/2012 3.4 (L)   Magnesium (mg/dL)  Date Value  04/02/2019 1.6 (L)  03/06/2012 1.9   Calcium (mg/dL)  Date Value  04/02/2019 7.0 (L)   Calcium, Total (mg/dL)  Date Value  03/07/2012 8.2 (L)   Albumin (g/dL)  Date Value  04/02/2019 2.2 (L)  03/05/2012 3.5   Phosphorus (mg/dL)  Date Value  07/21/2015 3.5   Sodium (mmol/L)  Date Value  04/02/2019 135  03/07/2012 142    Assessment: 85 y.o. male with pertinent past medical history of CAD, chronic diastolic congestive heart failure, hyperlipidemia, hypertension, prostate cancer, CKD stage IV, chronic left hydronephrosis, and chronic elevated LFTs presenting to the ED with chief complaints of fever and abdominal pain.  Pharmacy has been consulted to monitor and replenish electrolytes.  Goal of Therapy:  Electrolytes wnl's  Plan:  No further electrolyte supplementation warranted at this time   Will check electrolytes with am labs  Dallie Piles, PharmD Clinical Pharmacist 04/02/2019 7:29 PM

## 2019-04-02 NOTE — ED Triage Notes (Signed)
Pt brought in from home with co fever and abd pain. Was seen here recently for the same but unsure of diagnosis. Pt lives with wife at home.

## 2019-04-02 NOTE — ED Provider Notes (Signed)
[facility] Emergency Department Provider Note   ____________________________________________   First MD Initiated Contact with Patient 04/02/19 0120     (approximate)  I have reviewed the triage vital signs and the nursing notes.   HISTORY  Chief Complaint Weakness   HPI [name] is a 86 y.o. male returns to the ED from home via EMS with a chief complaint of weakness, fever and possible GI bleed.  Patient was seen in the ED 2 days ago for epigastric pain and dark stools.  Found to have chronically elevated LFTs, possible cystitis on CT scan and offered admission but family declined.  Returns tonight for the above symptoms.  EMS reports patient felt hot at the scene and was concerned for probable sepsis.  Patient denies chest pain, shortness of breath, abdominal pain, nausea or vomiting.  Just states he feels cold.       Past Medical History:  Diagnosis Date  . Cardiac disease   . CHF (congestive heart failure) ([place])   . Hypercholesteremia   . Hypertension   . Kidney failure    left  . Osteoporosis   . Prostate cancer ([place])   . Shingles   . Skin cancer    right cheek    Patient Active Problem List   Diagnosis Date Noted  . Chronic diastolic (congestive) heart failure ([place]) 03/31/2019  . Malignant neoplasm of prostate ([place]) 03/31/2019  . Weakness   . Elevated LFTs   . Chronic kidney disease, stage 4 (severe) ([place])   . Aortic atherosclerosis ([place])   . Bilateral nephrolithiasis   . Hydronephrosis concurrent with and due to calculi of kidney and ureter   . Acute encephalopathy 03/08/2018  . Gallbladder abscess 07/31/2016  . Cholecystitis 07/20/2016  . Hematuria 06/25/2016  . Sepsis ([place]) 06/25/2016  . Acute cholecystitis   . Hypokalemia 07/23/2015  . Generalized weakness 07/23/2015  . Bladder outlet obstruction 07/23/2015  . Acute delirium 07/23/2015  . Benign essential HTN 07/23/2015  . Acute on chronic renal failure ([place])  07/20/2015  . GIB (gastrointestinal bleeding) 06/03/2015  . Kidney stone 05/25/2012  . Ureteric stone 05/25/2012  . Hydronephrosis 05/25/2012  . Chronic kidney disease, stage IV (severe) ([place]) 05/25/2012    Past Surgical History:  Procedure Laterality Date  . CARDIAC CATHETERIZATION  04/13/2014  . CT PERC CHOLECYSTOSTOMY  07/20/2016   Placed at [facility] Interventional Radiology    Prior to Admission medications   Medication Sig Start Date End Date Taking? Authorizing Provider  Acetaminophen (TYLENOL PO) Take 1-2 tablets by mouth daily as needed (pain/headache).    [provider]  aspirin EC 81 MG tablet Take 81 mg by mouth at bedtime.    [provider]  atorvastatin (LIPITOR) 40 MG tablet Take 40 mg by mouth at bedtime.     [provider]  calcitRIOL (ROCALTROL) 0.25 MCG capsule Take 0.25 mcg by mouth See admin instructions. Take one capsule (0.25 mcg) by mouth every Monday, Wednesday, Friday night 02/06/18   [provider]  Cholecalciferol (VITAMIN D3) 5000 units TABS Take 5,000 Units by mouth daily.    [provider]  doxazosin (CARDURA) 4 MG tablet Take 4 mg by mouth at bedtime.     [provider]  ferrous sulfate 325 (65 FE) MG tablet Take 325 mg by mouth 2 (two) times daily.     [provider]  isosorbide mononitrate (IMDUR) 30 MG 24 hr tablet Take 1 tablet (30 mg total) by mouth  daily. 03/11/18   [place], Nila Nephew D, DO  levothyroxine (SYNTHROID, LEVOTHROID) 88 MCG tablet Take 88 mcg by mouth daily. 02/11/18   [provider]  metoprolol succinate (TOPROL-XL) 50 MG 24 hr tablet Take 50 mg by mouth daily. Take with or immediately following a meal.    [provider]  Omega-3 Fatty Acids (FISH OIL) 1200 MG CAPS Take 1,200 mg by mouth 2 (two) times daily.    [provider]  pantoprazole (PROTONIX) 40 MG tablet Take 1 tablet (40 mg total) by mouth 2 (two) times daily. 03/10/18   [place]field, Carley D,  DO  potassium chloride SA (K-DUR,KLOR-CON) 20 MEQ tablet Take 20 mEq by mouth 2 (two) times daily.  08/18/15   [provider]  sodium bicarbonate 650 MG tablet Take 1,300 mg by mouth 2 (two) times daily.     [provider]  tamsulosin (FLOMAX) 0.4 MG CAPS capsule Take 0.4 mg by mouth at bedtime.     [provider]  torsemide (DEMADEX) 20 MG tablet Take 40 mg by mouth daily.  10/27/15   [provider]    Allergies Ativan [lorazepam] and Penicillin g  Family History  Problem Relation Age of Onset  . CAD Mother   . Cancer Mother   . Heart attack Father     Social History Social History   Tobacco Use  . Smoking status: Former Smoker    Types: Cigarettes, Cigars  . Smokeless tobacco: Current User    Types: Chew  Substance Use Topics  . Alcohol use: No    Alcohol/week: 0.0 standard drinks  . Drug use: No    Review of Systems  Constitutional: Positive for fever and generalized weakness. Eyes: No visual changes. ENT: No sore throat. Cardiovascular: Denies chest pain. Respiratory: Denies shortness of breath. Gastrointestinal: No abdominal pain.  No nausea, no vomiting.  No diarrhea.  No constipation. Genitourinary: Negative for dysuria. Musculoskeletal: Negative for back pain. Skin: Negative for rash. Neurological: Negative for headaches, focal weakness or numbness.   ____________________________________________   PHYSICAL EXAM:  VITAL SIGNS: ED Triage Vitals  Enc Vitals Group     BP      Pulse      Resp      Temp      Temp src      SpO2      Weight      Height      Head Circumference      Peak Flow      Pain Score      Pain Loc      Pain Edu?      Excl. in [place]?     Constitutional: Alert and oriented.  Elderly appearing and in mild acute distress. Eyes: Conjunctivae are normal. PERRL. EOMI. Head: Atraumatic. Nose: No congestion/rhinnorhea. Mouth/Throat: Mucous membranes are moist.  Oropharynx non-erythematous. Neck:  No stridor.   Cardiovascular: Normal rate, regular rhythm. Grossly normal heart sounds.  Good peripheral circulation. Respiratory: Normal respiratory effort.  No retractions. Lungs CTAB. Gastrointestinal: Soft and nontender to light or deep palpation. No distention. No abdominal bruits. No CVA tenderness. Musculoskeletal: No lower extremity tenderness nor edema.  No joint effusions. Neurologic:  Normal speech and language. No gross focal neurologic deficits are appreciated.  Skin:  Skin is hot, dry and intact. No rash noted.  No petechiae. Psychiatric: Mood and affect are normal. Speech and behavior are normal.  ____________________________________________   LABS (all labs ordered are listed, but only abnormal results are  displayed)  Labs Reviewed  LACTIC ACID, PLASMA - Abnormal; Notable for the following components:      Result Value   Lactic Acid, Venous 3.8 (*)    All other components within normal limits  COMPREHENSIVE METABOLIC PANEL - Abnormal; Notable for the following components:   Sodium 132 (*)    Potassium 3.4 (*)    CO2 16 (*)    BUN 52 (*)    Creatinine, Ser 3.69 (*)    Calcium 7.4 (*)    Total Protein 5.0 (*)    Albumin 2.3 (*)    AST 217 (*)    ALT 214 (*)    Alkaline Phosphatase 244 (*)    Total Bilirubin 4.0 (*)    GFR calc non Af Amer 14 (*)    GFR calc Af Amer 16 (*)    All other components within normal limits  CBC WITH DIFFERENTIAL/PLATELET - Abnormal; Notable for the following components:   WBC 2.6 (*)    RBC 3.15 (*)    Hemoglobin 9.7 (*)    HCT 29.0 (*)    Platelets 73 (*)    Lymphs Abs 0.1 (*)    Monocytes Absolute 0.0 (*)    All other components within normal limits  URINALYSIS, ROUTINE W REFLEX MICROSCOPIC - Abnormal; Notable for the following components:   Color, Urine YELLOW (*)    APPearance HAZY (*)    Hgb urine dipstick MODERATE (*)    Bacteria, UA RARE (*)    All other components within normal limits  TROPONIN I (HIGH SENSITIVITY) -  Abnormal; Notable for the following components:   Troponin I (High Sensitivity) 60 (*)    All other components within normal limits  CULTURE, BLOOD (ROUTINE X 2)  CULTURE, BLOOD (ROUTINE X 2)  URINE CULTURE  APTT  PROTIME-INR  LIPASE, BLOOD  LACTIC ACID, PLASMA  CBC  COMPREHENSIVE METABOLIC PANEL  TROPONIN I (HIGH SENSITIVITY)   ____________________________________________  EKG  ED ECG REPORT I, , J, the attending physician, personally viewed and interpreted this ECG.   Date: 04/02/2019  EKG Time: 0130  Rate: 97  Rhythm: normal EKG, normal sinus rhythm  Axis: Normal  Intervals:right bundle branch block  ST&T Change: Nonspecific  ____________________________________________  RADIOLOGY  ED MD interpretation: No acute cardiopulmonary process  Official radiology report(s): Dg Chest Port 1 View  Result Date: 04/02/2019 CLINICAL DATA:  85 year old male with fever and cough. EXAM: PORTABLE CHEST 1 VIEW COMPARISON:  Chest radiograph dated 03/31/2019 FINDINGS: Left lung base density, likely atelectasis. Infiltrate is not excluded. Clinical correlation is recommended. There is diffuse interstitial prominence similar prior radiograph. No new consolidative changes. No large pleural effusion or pneumothorax. Stable cardiac silhouette. Moderate size hiatal hernia. No acute osseous pathology. IMPRESSION: No interval change. Electronically Signed   By: Anner Crete M.D.   On: 04/02/2019 02:25    ____________________________________________   PROCEDURES  Procedure(s) performed (including Critical Care):  Procedures  CRITICAL CARE Performed by: Paulette Blanch   Total critical care time: 45 minutes  Critical care time was exclusive of separately billable procedures and treating other patients.  Critical care was necessary to treat or prevent imminent or life-threatening deterioration.  Critical care was time spent personally by me on the following activities:  development of treatment plan with patient and/or surrogate as well as nursing, discussions with consultants, evaluation of patient's response to treatment, examination of patient, obtaining history from patient or surrogate, ordering and performing treatments and interventions, ordering and review of  laboratory studies, ordering and review of radiographic studies, pulse oximetry and re-evaluation of patient's condition. ____________________________________________   INITIAL IMPRESSION / ASSESSMENT AND PLAN / ED COURSE  As part of my medical decision making, I reviewed the following data within the [place] History obtained from family, Nursing notes reviewed and incorporated, Labs reviewed, EKG interpreted, Old chart reviewed, Radiograph reviewed, Discussed with admitting physician and Notes from prior ED visits     [name] was evaluated in Emergency Department on 04/02/2019 for the symptoms described in the history of present illness. He was evaluated in the context of the global COVID-19 pandemic, which necessitated consideration that the patient might be at risk for infection with the SARS-CoV-2 virus that causes COVID-19. Institutional protocols and algorithms that pertain to the evaluation of patients at risk for COVID-19 are in a state of rapid change based on information released by regulatory bodies including the CDC and federal and state organizations. These policies and algorithms were followed during the patient's care in the ED.    85 year old male who returns to the ED from home for fever and generalized weakness.  Differential diagnosis includes but is not limited to sepsis, community-acquired pneumonia, UTI, cholecystitis, etc.  ED code sepsis initiated upon patient's arrival.  Rectal temperature is 102 F.  Patient had negative COVID swab just 2 days ago.  Clinical Course as of Apr 01 400  Thu Apr 02, 2019  0230 Broad-spectrum IV antibiotics ordered.   Discussed with hospitalist NP [place] who will evaluate patient in the emergency department for admission.   [JS]    Clinical Course User Index [JS] Paulette Blanch, MD     ____________________________________________   FINAL CLINICAL IMPRESSION(S) / ED DIAGNOSES  Final diagnoses:  Pancytopenia ([place])  Febrile illness  Sepsis, due to unspecified organism, unspecified whether acute organ dysfunction present [facility])     ED Discharge Orders    None       Note:  This document was prepared using Dragon voice recognition software and may include unintentional dictation errors.   Paulette Blanch, MD 04/02/19 [phone]

## 2019-04-02 NOTE — ED Notes (Addendum)
ED TO INPATIENT HANDOFF REPORT  ED Nurse Name and Phone #:   Gershon Mussel RN   #[phone]  S Name/Age/Gender [name] 85 y.o. male Room/Bed: ED04A/ED04A  Code Status   Code Status: Full Code  Home/SNF/Other Home Patient oriented to: self, place, time and situation Is this baseline? Yes   Triage Complete: Triage complete  Chief Complaint Abd Pain Poss Sepsis  Triage Note Pt brought in from home with co fever and abd pain. Was seen here recently for the same but unsure of diagnosis. Pt lives with wife at home.    Allergies Allergies  Allergen Reactions  . Ativan [Lorazepam] Other (See Comments)    Hallucinations, combative  . Penicillin G Other (See Comments)    Has patient had a PCN reaction causing immediate rash, facial/tongue/throat swelling, SOB or lightheadedness with hypotension: Yes Has patient had a PCN reaction causing severe rash involving mucus membranes or skin necrosis: No Has patient had a PCN reaction that required hospitalization No Has patient had a PCN reaction occurring within the last 10 years: No If all of the above answers are "NO", then may proceed with Cephalosporin use.     Level of Care/Admitting Diagnosis ED Disposition    ED Disposition Condition [facility] Area: [place] [[id number]]  Level of Care: Med-Surg [16]  Covid Evaluation: Confirmed COVID Negative  Diagnosis: Sepsis [facility]) [[id number]]  Admitting Physician: Lang Snow [[id number]]  Attending Physician: Rufina Falco ACHIENG [[id number]]  Estimated length of stay: past midnight tomorrow  Certification:: I certify this patient will need inpatient services for at least 2 midnights  PT Class (Do Not Modify): Inpatient [101]  PT Acc Code (Do Not Modify): Private [1]       B Medical/Surgery History Past Medical History:  Diagnosis Date  . Cardiac disease   . CHF (congestive heart failure) ([place])   . Hypercholesteremia   . Hypertension   .  Kidney failure    left  . Osteoporosis   . Prostate cancer ([place])   . Shingles   . Skin cancer    right cheek   Past Surgical History:  Procedure Laterality Date  . CARDIAC CATHETERIZATION  04/13/2014  . CT PERC CHOLECYSTOSTOMY  07/20/2016   Placed at [facility] Interventional Radiology     A IV Location/Drains/Wounds Patient Lines/Drains/Airways Status   Active Line/Drains/Airways    Name:   Placement date:   Placement time:   Site:   Days:   Peripheral IV 04/02/19 Right Antecubital   04/02/19    0127    Antecubital   less than 1   Peripheral IV 04/02/19 Left Antecubital   04/02/19    0128    Antecubital   less than 1          Intake/Output Last 24 hours  Intake/Output Summary (Last 24 hours) at 04/02/2019 0324 Last data filed at 04/02/2019 0247 Gross per 24 hour  Intake 2000 ml  Output -  Net 2000 ml    Labs/Imaging Results for orders placed or performed during the hospital encounter of 04/02/19 (from the past 48 hour(s))  Lactic acid, plasma     Status: Abnormal   Collection Time: 04/02/19  1:33 AM  Result Value Ref Range   Lactic Acid, Venous 3.8 (HH) 0.5 - 1.9 mmol/L    Comment: CRITICAL RESULT CALLED TO, READ BACK BY AND VERIFIED WITH TOM Glorene Leitzke ON 04/02/2019 AT 0201 QSD Performed at [facility], [address]  [place], [place] [address]   Comprehensive metabolic panel     Status: Abnormal   Collection Time: 04/02/19  1:33 AM  Result Value Ref Range   Sodium 132 (L) 135 - 145 mmol/L   Potassium 3.4 (L) 3.5 - 5.1 mmol/L   Chloride 102 98 - 111 mmol/L   CO2 16 (L) 22 - 32 mmol/L   Glucose, Bld 88 70 - 99 mg/dL   BUN 52 (H) 8 - 23 mg/dL   Creatinine, Ser 3.69 (H) 0.61 - 1.24 mg/dL   Calcium 7.4 (L) 8.9 - 10.3 mg/dL   Total Protein 5.0 (L) 6.5 - 8.1 g/dL   Albumin 2.3 (L) 3.5 - 5.0 g/dL   AST 217 (H) 15 - 41 U/L   ALT 214 (H) 0 - 44 U/L   Alkaline Phosphatase 244 (H) 38 - 126 U/L   Total Bilirubin 4.0 (H) 0.3 - 1.2 mg/dL   GFR calc non Af Amer 14 (L)  >60 mL/min   GFR calc Af Amer 16 (L) >60 mL/min   Anion gap 14 5 - 15    Comment: Performed at [facility], [address] [place], [place] [address]  CBC WITH DIFFERENTIAL     Status: Abnormal   Collection Time: 04/02/19  1:33 AM  Result Value Ref Range   WBC 2.6 (L) 4.0 - 10.5 K/uL   RBC 3.15 (L) 4.22 - 5.81 MIL/uL   Hemoglobin 9.7 (L) 13.0 - 17.0 g/dL   HCT 29.0 (L) 39.0 - 52.0 %   MCV 92.1 80.0 - 100.0 fL   MCH 30.8 26.0 - 34.0 pg   MCHC 33.4 30.0 - 36.0 g/dL   RDW 14.6 11.5 - 15.5 %   Platelets 73 (L) 150 - 400 K/uL    Comment: Immature Platelet Fraction may be clinically indicated, consider ordering this additional test [id number]    nRBC 0.0 0.0 - 0.2 %   Neutrophils Relative % 93 %   Neutro Abs 2.5 1.7 - 7.7 K/uL   Lymphocytes Relative 3 %   Lymphs Abs 0.1 (L) 0.7 - 4.0 K/uL   Monocytes Relative 2 %   Monocytes Absolute 0.0 (L) 0.1 - 1.0 K/uL   Eosinophils Relative 0 %   Eosinophils Absolute 0.0 0.0 - 0.5 K/uL   Basophils Relative 0 %   Basophils Absolute 0.0 0.0 - 0.1 K/uL   Immature Granulocytes 2 %   Abs Immature Granulocytes 0.04 0.00 - 0.07 K/uL    Comment: Performed at [facility], [address] [place], [place] [address]  APTT     Status: None   Collection Time: 04/02/19  1:33 AM  Result Value Ref Range   aPTT 30 24 - 36 seconds    Comment: Performed at [facility], [address] [place], [place] [address]  Protime-INR     Status: None   Collection Time: 04/02/19  1:33 AM  Result Value Ref Range   Prothrombin Time 13.4 11.4 - 15.2 seconds   INR 1.0 0.8 - 1.2    Comment: (NOTE) INR goal varies based on device and disease states. Performed at [facility], [address] [place], [place] [address]   Urinalysis, Routine w reflex microscopic     Status: Abnormal   Collection Time: 04/02/19  1:33 AM  Result Value Ref Range   Color, Urine YELLOW (A) YELLOW   APPearance HAZY (A) CLEAR   Specific Gravity,  Urine 1.008 1.005 - 1.030   pH 5.0 5.0 -  8.0   Glucose, UA NEGATIVE NEGATIVE mg/dL   Hgb urine dipstick MODERATE (A) NEGATIVE   Bilirubin Urine NEGATIVE NEGATIVE   Ketones, ur NEGATIVE NEGATIVE mg/dL   Protein, ur NEGATIVE NEGATIVE mg/dL   Nitrite NEGATIVE NEGATIVE   Leukocytes,Ua NEGATIVE NEGATIVE   RBC / HPF 0-5 0 - 5 RBC/hpf   WBC, UA 0-5 0 - 5 WBC/hpf   Bacteria, UA RARE (A) NONE SEEN   Squamous Epithelial / LPF 0-5 0 - 5   Hyaline Casts, UA PRESENT     Comment: Performed at [facility], [address] [place], [place] [address]  Lipase, blood     Status: None   Collection Time: 04/02/19  1:33 AM  Result Value Ref Range   Lipase 33 11 - 51 U/L    Comment: Performed at [facility], [place], [place] [address]  Troponin I (High Sensitivity)     Status: Abnormal   Collection Time: 04/02/19  1:33 AM  Result Value Ref Range   Troponin I (High Sensitivity) 60 (H) <18 ng/L    Comment: (NOTE) Elevated high sensitivity troponin I (hsTnI) values and significant  changes across serial measurements may suggest ACS but many other  chronic and acute conditions are known to elevate hsTnI results.  Refer to the "Links" section for chest pain algorithms and additional  guidance. Performed at [facility], [address] [place], [place] [address]    Ct Abdomen Pelvis Wo Contrast  Result Date: 03/31/2019 CLINICAL DATA:  Acute generalized abdominal pain. EXAM: CT ABDOMEN AND PELVIS WITHOUT CONTRAST TECHNIQUE: Multidetector CT imaging of the abdomen and pelvis was performed following the standard protocol without IV contrast. COMPARISON:  03/08/2018 FINDINGS: Lower chest: Sizable hiatal hernia. Chronic trace pleural fluid at the left base. Coronary calcification. Hepatobiliary: No focal liver abnormality.No evidence of biliary obstruction or stone. Pancreas: Generalized atrophy. Spleen: Unremarkable. Adrenals/Urinary Tract: Negative adrenals.  Chronic left hydroureteronephrosis due to distal ureteral calculus with severe left renal atrophy. The left kidney is likely nonfunctional. 15 mm left interpolar calculus. No right hydronephrosis. Thick walled bladder with subtle cellule/diverticulum. Mild perivesicular fat haziness. Stomach/Bowel:  No obstruction. Left colonic diverticulosis. Vascular/Lymphatic: No acute vascular abnormality. No mass or adenopathy. Haziness of fat in the sigmoid mesentery without mass or adenopathy-consistent with chronic mesenteric panniculitis. Reproductive:No acute finding Other: No ascites or pneumoperitoneum. Musculoskeletal: No acute abnormalities. Osteopenia and spinal degeneration IMPRESSION: 1. Possible cystitis. 2. Finding suggesting chronic bladder outlet obstruction. There is only mild bladder distention currently. 3. 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 4. Large hiatal hernia. Electronically Signed   By: Monte Fantasia M.D.   On: 03/31/2019 06:24   Dg Chest Port 1 View  Result Date: 04/02/2019 CLINICAL DATA:  85 year old male with fever and cough. EXAM: PORTABLE CHEST 1 VIEW COMPARISON:  Chest radiograph dated 03/31/2019 FINDINGS: Left lung base density, likely atelectasis. Infiltrate is not excluded. Clinical correlation is recommended. There is diffuse interstitial prominence similar prior radiograph. No new consolidative changes. No large pleural effusion or pneumothorax. Stable cardiac silhouette. Moderate size hiatal hernia. No acute osseous pathology. IMPRESSION: No interval change. Electronically Signed   By: Anner Crete M.D.   On: 04/02/2019 02:25   Dg Chest Portable 1 View  Result Date: 03/31/2019 CLINICAL DATA:  Cough and dyspnea EXAM: PORTABLE CHEST 1 VIEW COMPARISON:  03/08/2018 chest x-ray and abdominal CT FINDINGS: Apparent cardiomegaly is primarily related to a large hiatal hernia based on prior.  There is also scarring at the left lower lobe. Interstitial  crowding at the bases. No edema, effusion, or pneumothorax. Study is limited by leftward rotation. IMPRESSION: 1. Low volume chest with atelectasis at the bases. 2. Large hiatal hernia with overlying scar/atelectasis limiting assessment of the left base. Electronically Signed   By: Monte Fantasia M.D.   On: 03/31/2019 05:06   US Abdomen Limited Ruq  Result Date: 03/31/2019 CLINICAL DATA:  Right upper quadrant pain.  Elevated LFTs. EXAM: ULTRASOUND ABDOMEN LIMITED RIGHT UPPER QUADRANT COMPARISON:  CT 03/31/2019. FINDINGS: Gallbladder: No gallstones or wall thickening visualized. No sonographic Murphy sign noted by sonographer. Common bile duct: Diameter: 5.4 mm Liver: No focal lesion identified. Within normal limits in parenchymal echogenicity. Portal vein is patent on color Doppler imaging with normal direction of blood flow towards the liver. Other: None. IMPRESSION: No acute abnormality. No gallstones or biliary distention. Liver appears normal. Electronically Signed   By: Marcello Moores  Register   On: 03/31/2019 07:41    Pending Labs Unresulted Labs (From admission, onward)    Start     Ordered   04/02/19 0500  CBC  Tomorrow morning,   STAT     04/02/19 0251   04/02/19 0500  Comprehensive metabolic panel  Tomorrow morning,   STAT     04/02/19 0251   04/02/19 0122  Lactic acid, plasma  Now then every 2 hours,   STAT     04/02/19 0122   04/02/19 0122  Blood Culture (routine x 2)  BLOOD CULTURE X 2,   STAT     04/02/19 0122   04/02/19 0122  Urine culture  ONCE - STAT,   STAT     04/02/19 0122          Vitals/Pain Today's Vitals   04/02/19 0129 04/02/19 0130 04/02/19 0230 04/02/19 0300  BP: 102/74  (!) 102/47 (!) 93/55  Pulse: 90  85 96  Resp: 20  (!) 22 19  Temp: (!) 102.1 F (38.9 C)  98.6 F (37 C)   TempSrc: Rectal  Oral   SpO2: 95%  97% 96%  Weight:  61.2 kg    PainSc:  3   0-No pain    Isolation Precautions No active isolations  Medications Medications  sodium chloride 0.9  % bolus 1,000 mL (0 mLs Intravenous Stopped 04/02/19 0247)    And  sodium chloride 0.9 % bolus 1,000 mL (1,000 mLs Intravenous New Bag/Given 04/02/19 0247)    And  sodium chloride 0.9 % bolus 250 mL (has no administration in time range)  meropenem (MERREM) 1 g in sodium chloride 0.9 % 100 mL IVPB (1 g Intravenous New Bag/Given 04/02/19 0230)  vancomycin (VANCOCIN) 1,500 mg in sodium chloride 0.9 % 500 mL IVPB (1,500 mg Intravenous New Bag/Given 04/02/19 0245)  0.9 %  sodium chloride infusion (has no administration in time range)  acetaminophen (TYLENOL) tablet 1,000 mg (1,000 mg Oral Given 04/02/19 0221)    Mobility walks with device Moderate fall risk   Focused Assessments Cardiac Assessment Handoff:    Lab Results  Component Value Date   CKTOTAL 170 12/02/2010   CKMB 2.7 12/02/2010   TROPONINI 0.03 (HH) 07/20/2016   Lab Results  Component Value Date   DDIMER  12/03/2010    0.22        AT THE INHOUSE ESTABLISHED CUTOFF VALUE OF 0.48 ug/mL FEU, THIS ASSAY HAS BEEN DOCUMENTED IN THE LITERATURE TO HAVE A SENSITIVITY AND NEGATIVE PREDICTIVE VALUE OF AT LEAST  98 TO 99%.  THE TEST RESULT SHOULD BE CORRELATED WITH AN ASSESSMENT OF THE CLINICAL PROBABILITY OF DVT / VTE.   Does the Patient currently have chest pain? No     R Recommendations: See Admitting Provider Note  Report given to:  Marcie Bal RN on [facility]  Additional Notes:

## 2019-04-02 NOTE — Progress Notes (Signed)
Advanced care plan.  Purpose of the Encounter: CODE STATUS  Parties in Attendance: Patient himself  Patient's Decision Capacity: Intact  Subjective/Patient's story: 85 y.o. male with pertinent past medical history of CAD, chronic diastolic congestive heart failure, hyperlipidemia, hypertension, prostate cancer, CKD stage IV, chronic left hydronephrosis, and chronic elevated LFTs presenting to the ED with chief complaints of fever and abdominal pain   Objective/Medical story Discussed with patient regarding his desires for cardiac and pulmonary resuscitation.  Explained him what CODE STATUS was.     Goals of care determination:  Patient states that he is not sure about all this and wants to think about this and not make any decisions right now.   CODE STATUS:  Full code  Time spent discussing advanced care planning: 16 minutes

## 2019-04-02 NOTE — ED Notes (Signed)
Date and time results received: 04/02/19 2:03 AM  Test: lactic acid Critical Value: 3.8  Name of Provider Notified: Dr Beather Arbour

## 2019-04-02 NOTE — Consult Note (Addendum)
PHARMACY - PHYSICIAN COMMUNICATION CRITICAL VALUE ALERT - BLOOD CULTURE IDENTIFICATION (BCID)  [name] is an 85 y.o. male who presented to [facility] on 04/02/2019 with a chief complaint of Weakness/Sepsis  Assessment:  Blood culture 4 of 4 Gram Negative Rods.  BCID shows E. Coli, KPC (-)  (include suspected source if known)  Name of physician (or Provider) Contacted: [name]  Current antibiotics: Meropenem/Vancomycin  Changes to prescribed antibiotics recommended:  Continue Meropenem pending susceptibilities,  Consider D/C Vancomycin (ok to d/c per Dr [name])  Lu Duffel, PharmD, BCPS Clinical Pharmacist 04/02/2019 12:12 PM

## 2019-04-02 NOTE — Progress Notes (Signed)
Pharmacy Antibiotic Note  [name] is a 85 y.o. male admitted on 04/02/2019 with sepsis.  Pharmacy has been consulted for vanc/meropenem dosing.  Plan: Patient received vanc 1.5g IV load and meropenem 1g IV x 1 in ED  Vancomycin 500 mg IV Q 36 hrs. Goal AUC 400-550. Expected AUC: 494.5 SCr used: 3.69 Cssmin: 15.6  Will continue meropenem 1g q12h per CrCl 10 - </= 25 ml/min and will continue to monitor renal function and adjust doses as necessary, patient was placed on maintenance regimen vs. Random dosing since he is in stable CKD. Will continue to monitor Scr and switch to random levels if Scr rises acutely.  Weight: 135 lb (61.2 kg)  Temp (24hrs), Avg:99.5 F (37.5 C), Min:97.7 F (36.5 C), Max:102.1 F (38.9 C)  Recent Labs  Lab 03/31/19 0408 04/02/19 0133 04/02/19 0354  WBC 4.7 2.6*  --   CREATININE 3.75* 3.69*  --   LATICACIDVEN  --  3.8* 2.6*    Estimated Creatinine Clearance: 11.7 mL/min (A) (by C-G formula based on SCr of 3.69 mg/dL (H)).    Allergies  Allergen Reactions  . Ativan [Lorazepam] Other (See Comments)    Hallucinations, combative  . Penicillin G Other (See Comments)    Has patient had a PCN reaction causing immediate rash, facial/tongue/throat swelling, SOB or lightheadedness with hypotension: Yes Has patient had a PCN reaction causing severe rash involving mucus membranes or skin necrosis: No Has patient had a PCN reaction that required hospitalization No Has patient had a PCN reaction occurring within the last 10 years: No If all of the above answers are "NO", then may proceed with Cephalosporin use.     Thank you for allowing pharmacy to be a part of this patient's care.  Tobie Lords, PharmD, BCPS Clinical Pharmacist 04/02/2019 5:30 AM

## 2019-04-02 NOTE — Progress Notes (Signed)
CODE SEPSIS - PHARMACY COMMUNICATION  **Broad Spectrum Antibiotics should be administered within 1 hour of Sepsis diagnosis**  Time Code Sepsis Called/Page Received: 0131  Antibiotics Ordered: vanc/meropenem  Time of 1st antibiotic administration: 0230  Additional action taken by pharmacy:   If necessary, Name of Provider/Nurse Contacted:     Tobie Lords ,PharmD Clinical Pharmacist  04/02/2019  5:46 AM

## 2019-04-02 NOTE — Consult Note (Signed)
PHARMACY CONSULT NOTE - FOLLOW UP  Pharmacy Consult for Electrolyte Monitoring and Replacement   Recent Labs: Potassium (mmol/L)  Date Value  04/02/2019 2.9 (L)  03/07/2012 3.4 (L)   Magnesium (mg/dL)  Date Value  06/29/2016 2.0  03/06/2012 1.9   Calcium (mg/dL)  Date Value  04/02/2019 7.0 (L)   Calcium, Total (mg/dL)  Date Value  03/07/2012 8.2 (L)   Albumin (g/dL)  Date Value  04/02/2019 2.2 (L)  03/05/2012 3.5   Phosphorus (mg/dL)  Date Value  07/21/2015 3.5   Sodium (mmol/L)  Date Value  04/02/2019 135  03/07/2012 142    Assessment: 85 y.o. male with pertinent past medical history of CAD, chronic diastolic congestive heart failure, hyperlipidemia, hypertension, prostate cancer, CKD stage IV, chronic left hydronephrosis, and chronic elevated LFTs presenting to the ED with chief complaints of fever and abdominal pain.  Pharmacy has been consulted to monitor and replenish electrolytes.  Goal of Therapy:  Electrolytes wnl's  Plan:   K=2.9 Will give KCl IV 51meq x 4 and recheck potassium @ 1800  Mg = 1.6 Will give Mg Sulfate 2g IV x 1  Corrected Calcium = 8.44  Will continue to monitor Calcium - no replenishment warranted at this time  Will check electrolytes with am labs  Lu Duffel, PharmD, BCPS Clinical Pharmacist 04/02/2019 8:14 AM

## 2019-04-02 NOTE — Evaluation (Signed)
Physical Therapy Evaluation Patient Details Name: [name] MRN: [id number] DOB: 1927/02/08 Today's Date: 04/02/2019   History of Present Illness  85 y.o. male with pertinent past medical history of CAD, chronic diastolic congestive heart failure, hyperlipidemia, hypertension, prostate cancer, CKD stage IV, chronic left hydronephrosis, and chronic elevated LFTs presenting to the ED with chief complaints of fever and abdominal pain.  Per pt and family pt has been weak and having more and more issues with mobility over the last few weeks.  Clinical Impression  Pt was motivated to get out of bed and do some activity with PT but ultimately was quite limited.  Per pt he has been less steady and weak for the last 2 weeks (speaking with son on the phone he has been functionally limited more more than a month and has struggled to effectively/safely use walker).  Pt needed considerable assist to get to sitting, had some issues with balance (losing balance backward in sitting and while in standing) needing direct PT assist to maintain upright.  Pt hopes to be able to go home, but per today's assessment this is not a safe option.  He does have a grandson that is there overnight but he and most family work t/o the day and are only able to be around inconsistently.  Pt showed good effort with was functionally limited and had multiple instances of safety concern, will need STR to work back to a more independent/functional status in the home. (Spoke with son on the phone and he is in agreement)    Follow Up Recommendations SNF    Equipment Recommendations  None recommended by PT    Recommendations for Other Services       Precautions / Restrictions Precautions Precautions: Fall Restrictions Weight Bearing Restrictions: No      Mobility  Bed Mobility Overal bed mobility: Independent;Needs Assistance Bed Mobility: Supine to Sit     Supine to sit: Mod assist     General bed mobility comments:  Pt showed good effort in getting to sitting but ultimately needed considerable assist from PT.  Once in sitting pt was able to maintain balance briefly and then fell back onto the bed in a completely uncontrolled way  Transfers Overall transfer level: Needs assistance Equipment used: Rolling walker (2 wheeled) Transfers: Sit to/from Stand Sit to Stand: Min assist         General transfer comment: Pt attempted to stand w/o assist unsuccessfully, with cuing and light assist he was able to attain standing  Ambulation/Gait Ambulation/Gait assistance: Min assist Gait Distance (Feet): 10 Feet Assistive device: Rolling walker (2 wheeled)       General Gait Details: Pt was able to take a few turning steps and then wanted to sit stating he didn't think he could do much more.   PT encouraged him to try a little more and though he as fatigued, reliant on the walker and showed guarded slow steps he was able to walk past the foot of the bed with the recliner following.  Pt did have on near LOB (backward) needing PT assist to maintain standing.   Stairs            Wheelchair Mobility    Modified Rankin (Stroke Patients Only)       Balance Overall balance assessment: Independent  Pertinent Vitals/Pain Pain Assessment: No/denies pain    Home Living Family/patient expects to be discharged to:: Unsure Living Arrangements: Spouse/significant other(grandson is with them over night (roughly 9pm to 9am))                    Prior Function Level of Independence: Needs assistance   Gait / Transfers Assistance Needed: apparently until ~1 month ago pt could manage in the home without a walker, has been limited with ambulation recently           Hand Dominance        Extremity/Trunk Assessment   Upper Extremity Assessment Upper Extremity Assessment: Generalized weakness(age appropriate limitations)    Lower  Extremity Assessment Lower Extremity Assessment: Generalized weakness(age appropriate limitations)       Communication   Communication: No difficulties(legally blind)  Cognition Arousal/Alertness: Awake/alert Behavior During Therapy: WFL for tasks assessed/performed Overall Cognitive Status: (inconsistent PLOF and assist at home, alert to situation)                                        General Comments      Exercises     Assessment/Plan    PT Assessment Patient needs continued PT services  PT Problem List Decreased strength;Decreased range of motion;Decreased activity tolerance;Decreased balance;Decreased mobility;Decreased knowledge of use of DME;Decreased safety awareness;Decreased coordination;Decreased cognition       PT Treatment Interventions DME instruction;Gait training;Stair training;Functional mobility training;Therapeutic activities;Therapeutic exercise;Balance training;Cognitive remediation;Neuromuscular re-education;Patient/family education    PT Goals (Current goals can be found in the Care Plan section)  Acute Rehab PT Goals Patient Stated Goal: go home PT Goal Formulation: With patient Time For Goal Achievement: 04/16/19 Potential to Achieve Goals: Fair    Frequency Min 2X/week   Barriers to discharge        Co-evaluation               AM-PAC PT "6 Clicks" Mobility  Outcome Measure Help needed turning from your back to your side while in a flat bed without using bedrails?: A Lot Help needed moving from lying on your back to sitting on the side of a flat bed without using bedrails?: A Lot Help needed moving to and from a bed to a chair (including a wheelchair)?: A Little Help needed standing up from a chair using your arms (e.g., wheelchair or bedside chair)?: A Lot Help needed to walk in hospital room?: A Lot Help needed climbing 3-5 steps with a railing? : A Lot 6 Click Score: 13    End of Session Equipment Utilized During  Treatment: Gait belt Activity Tolerance: Patient tolerated treatment well;Patient limited by fatigue Patient left: with chair alarm set;with call bell/phone within reach Nurse Communication: Mobility status PT Visit Diagnosis: Muscle weakness (generalized) (M62.81);Difficulty in walking, not elsewhere classified (R26.2);Unsteadiness on feet (R26.81)    Time: 5176-1607 PT Time Calculation (min) (ACUTE ONLY): 41 min   Charges:   PT Evaluation $PT Eval Low Complexity: 1 Low PT Treatments $Therapeutic Activity: 8-22 mins        Kreg Shropshire, DPT 04/02/2019, 5:18 PM

## 2019-04-02 NOTE — Progress Notes (Signed)
IV fluids stopped for now. The patient was coughing and lung sound sounded wet. MD order to stop fluids. Chest x-ray ordered. Speech therapy consulted. The patient has been able swallow meds.

## 2019-04-02 NOTE — Progress Notes (Signed)
[place] at [facility]                                                                                                                                                                                  Patient Demographics   [name], is a 85 y.o. male, DOB - 1926/07/25, JGO:[id number]  Admit date - 04/02/2019   Admitting Physician Lang Snow, NP  Outpatient Primary MD for the patient is [facility], [place]   LOS - 0  Subjective: Patient admitted with sepsis he states that he is not sure why he is here.  He currently denies any abdominal pain.    Review of Systems:   CONSTITUTIONAL: No documented fever. No fatigue, weakness. No weight gain, no weight loss.  EYES: No blurry or double vision.  ENT: No tinnitus. No postnasal drip. No redness of the oropharynx.  RESPIRATORY: No cough, no wheeze, no hemoptysis. No dyspnea.  CARDIOVASCULAR: No chest pain. No orthopnea. No palpitations. No syncope.  GASTROINTESTINAL: No nausea, no vomiting or diarrhea. No abdominal pain. No melena or hematochezia.  GENITOURINARY: No dysuria or hematuria.  ENDOCRINE: No polyuria or nocturia. No heat or cold intolerance.  HEMATOLOGY: No anemia. No bruising. No bleeding.  INTEGUMENTARY: No rashes. No lesions.  MUSCULOSKELETAL: No arthritis. No swelling. No gout.  NEUROLOGIC: No numbness, tingling, or ataxia. No seizure-type activity.  PSYCHIATRIC: No anxiety. No insomnia. No ADD.    Vitals:   Vitals:   04/02/19 0230 04/02/19 0300 04/02/19 0330 04/02/19 0431  BP: (!) 102/47 (!) 93/55 (!) 92/51 110/66  Pulse: 85 96 92 94  Resp: (!) 22 19 (!) 21 20  Temp: 98.6 F (37 C)   97.7 F (36.5 C)  TempSrc: Oral   Oral  SpO2: 97% 96% 95% 99%  Weight:    65.3 kg  Height:    5\' 8"  (1.727 m)    Wt Readings from Last 3 Encounters:  04/02/19 65.3 kg  03/31/19 67 kg  05/28/18 66 kg     Intake/Output Summary (Last 24 hours) at 04/02/2019 1328 Last data filed at  04/02/2019 0513 Gross per 24 hour  Intake 2600.08 ml  Output 200 ml  Net 2400.08 ml    Physical Exam:   GENERAL: Pleasant-appearing in no apparent distress.  HEAD, EYES, EARS, NOSE AND THROAT: Atraumatic, normocephalic. Extraocular muscles are intact. Pupils equal and reactive to light. Sclerae anicteric. No conjunctival injection. No oro-pharyngeal erythema.  NECK: Supple. There is no jugular venous distention. No bruits, no lymphadenopathy, no thyromegaly.  HEART: Regular rate and rhythm,. No murmurs, no rubs, no clicks.  LUNGS: Clear to auscultation bilaterally. No rales or rhonchi. No  wheezes.  ABDOMEN: Soft, flat, nontender, nondistended. Has good bowel sounds. No hepatosplenomegaly appreciated.  EXTREMITIES: No evidence of any cyanosis, clubbing, or peripheral edema.  +2 pedal and radial pulses bilaterally.  NEUROLOGIC: The patient is alert, awake, and oriented x3 with no focal motor or sensory deficits appreciated bilaterally.  SKIN: Moist and warm with no rashes appreciated.  Psych: Not anxious, depressed LN: No inguinal LN enlargement    Antibiotics   Anti-infectives (From admission, onward)   Start     Dose/Rate Route Frequency Ordered Stop   04/03/19 1500  vancomycin (VANCOCIN) 500 mg in sodium chloride 0.9 % 100 mL IVPB  Status:  Discontinued     500 mg 100 mL/hr over 60 Minutes Intravenous Every 36 hours 04/02/19 0528 04/02/19 1215   04/02/19 0230  meropenem (MERREM) 1 g in sodium chloride 0.9 % 100 mL IVPB     1 g 200 mL/hr over 30 Minutes Intravenous Every 12 hours 04/02/19 0218     04/02/19 0230  vancomycin (VANCOCIN) 1,500 mg in sodium chloride 0.9 % 500 mL IVPB     1,500 mg 250 mL/hr over 120 Minutes Intravenous  Once 04/02/19 0219 04/02/19 0508   04/02/19 0215  aztreonam (AZACTAM) 2 g in sodium chloride 0.9 % 100 mL IVPB  Status:  Discontinued     2 g 200 mL/hr over 30 Minutes Intravenous  Once 04/02/19 0201 04/02/19 0218   04/02/19 0215  metroNIDAZOLE (FLAGYL)  IVPB 500 mg  Status:  Discontinued     500 mg 100 mL/hr over 60 Minutes Intravenous  Once 04/02/19 0201 04/02/19 0218   04/02/19 0215  vancomycin (VANCOCIN) IVPB 1000 mg/200 mL premix  Status:  Discontinued     1,000 mg 200 mL/hr over 60 Minutes Intravenous  Once 04/02/19 0201 04/02/19 0219      Medications   Scheduled Meds: . [START ON 04/03/2019] influenza vaccine adjuvanted  0.5 mL Intramuscular Tomorrow-1000  . levothyroxine  88 mcg Oral Q0600   Continuous Infusions: . sodium chloride 50 mL/hr at 04/02/19 0903  . meropenem (MERREM) IV Stopped (04/02/19 0300)   PRN Meds:.   Data Review:   Micro Results Recent Results (from the past 240 hour(s))  SARS Coronavirus 2 [facility] order, Performed in [facility] hospital lab) Nasopharyngeal Nasopharyngeal Swab     Status: None   Collection Time: 03/31/19  4:09 AM   Specimen: Nasopharyngeal Swab  Result Value Ref Range Status   SARS Coronavirus 2 NEGATIVE NEGATIVE Final    Comment: (NOTE) If result is NEGATIVE SARS-CoV-2 target nucleic acids are NOT DETECTED. The SARS-CoV-2 RNA is generally detectable in upper and lower  respiratory specimens during the acute phase of infection. The lowest  concentration of SARS-CoV-2 viral copies this assay can detect is 250  copies / mL. A negative result does not preclude SARS-CoV-2 infection  and should not be used as the sole basis for treatment or other  patient management decisions.  A negative result may occur with  improper specimen collection / handling, submission of specimen other  than nasopharyngeal swab, presence of viral mutation(s) within the  areas targeted by this assay, and inadequate number of viral copies  (<250 copies / mL). A negative result must be combined with clinical  observations, patient history, and epidemiological information. If result is POSITIVE SARS-CoV-2 target nucleic acids are DETECTED. The SARS-CoV-2 RNA is generally detectable in upper and lower   respiratory specimens dur ing the acute phase of infection.  Positive  results are  indicative of active infection with SARS-CoV-2.  Clinical  correlation with patient history and other diagnostic information is  necessary to determine patient infection status.  Positive results do  not rule out bacterial infection or co-infection with other viruses. If result is PRESUMPTIVE POSTIVE SARS-CoV-2 nucleic acids MAY BE PRESENT.   A presumptive positive result was obtained on the submitted specimen  and confirmed on repeat testing.  While 2019 novel coronavirus  (SARS-CoV-2) nucleic acids may be present in the submitted sample  additional confirmatory testing may be necessary for epidemiological  and / or clinical management purposes  to differentiate between  SARS-CoV-2 and other Sarbecovirus currently known to infect humans.  If clinically indicated additional testing with an alternate test  methodology [phone]) is advised. The SARS-CoV-2 RNA is generally  detectable in upper and lower respiratory sp ecimens during the acute  phase of infection. The expected result is Negative. Fact Sheet for Patients:  StrictlyIdeas.no Fact Sheet for Healthcare Providers: BankingDealers.co.za This test is not yet approved or cleared by the [place] FDA and has been authorized for detection and/or diagnosis of SARS-CoV-2 by FDA under an Emergency Use Authorization (EUA).  This EUA will remain in effect (meaning this test can be used) for the duration of the COVID-19 declaration under Section 564(b)(1) of the Act, 21 U.S.C. section 360bbb-3(b)(1), unless the authorization is terminated or revoked sooner. Performed at [facility], [address] [place], [place] [address]   Urine culture     Status: None   Collection Time: 03/31/19 10:34 AM   Specimen: Urine, Random  Result Value Ref Range Status   Specimen Description   Final    URINE,  RANDOM Performed at [facility], [address], [place], [place] [address]    Special Requests   Final    NONE Performed at [facility], [address], [place], [place] [address]    Culture   Final    NO GROWTH Performed at [facility], [place] [address] [place], [place] [address]    Report Status 04/01/2019 FINAL  Final  Blood Culture (routine x 2)     Status: None (Preliminary result)   Collection Time: 04/02/19  1:33 AM   Specimen: BLOOD  Result Value Ref Range Status   Specimen Description BLOOD LEFT ASSIST CONTROL  Final   Special Requests   Final    BOTTLES DRAWN AEROBIC AND ANAEROBIC Blood Culture adequate volume   Culture  Setup Time   Final    Organism ID to follow GRAM NEGATIVE RODS ANAEROBIC BOTTLE ONLY Performed at [facility], [address] [place], [place] [address]    Culture GRAM NEGATIVE RODS  Final   Report Status PENDING  Incomplete  Blood Culture (routine x 2)     Status: None (Preliminary result)   Collection Time: 04/02/19  1:33 AM   Specimen: BLOOD  Result Value Ref Range Status   Specimen Description BLOOD RIGHT ASSIST CONTROL  Final   Special Requests   Final    BOTTLES DRAWN AEROBIC AND ANAEROBIC Blood Culture adequate volume   Culture  Setup Time   Final    GRAM NEGATIVE RODS CRITICAL RESULT CALLED TO, READ BACK BY AND VERIFIED WITH: CRITICAL VALUE NOTED.  VALUE IS CONSISTENT WITH PREVIOUSLY REPORTED AND CALLED VALUE. Performed at [facility], [address], [place],  [address]    Culture GRAM NEGATIVE RODS  Final   Report Status PENDING  Incomplete  Radiology Reports Ct Abdomen Pelvis Wo Contrast  Result Date: 03/31/2019 CLINICAL DATA:  Acute generalized abdominal pain. EXAM: CT ABDOMEN AND PELVIS WITHOUT CONTRAST TECHNIQUE: Multidetector CT imaging of the abdomen and pelvis was performed following the standard protocol without IV contrast. COMPARISON:  03/08/2018 FINDINGS: Lower  chest: Sizable hiatal hernia. Chronic trace pleural fluid at the left base. Coronary calcification. Hepatobiliary: No focal liver abnormality.No evidence of biliary obstruction or stone. Pancreas: Generalized atrophy. Spleen: Unremarkable. Adrenals/Urinary Tract: Negative adrenals. Chronic left hydroureteronephrosis due to distal ureteral calculus with severe left renal atrophy. The left kidney is likely nonfunctional. 15 mm left interpolar calculus. No right hydronephrosis. Thick walled bladder with subtle cellule/diverticulum. Mild perivesicular fat haziness. Stomach/Bowel:  No obstruction. Left colonic diverticulosis. Vascular/Lymphatic: No acute vascular abnormality. No mass or adenopathy. Haziness of fat in the sigmoid mesentery without mass or adenopathy-consistent with chronic mesenteric panniculitis. Reproductive:No acute finding Other: No ascites or pneumoperitoneum. Musculoskeletal: No acute abnormalities. Osteopenia and spinal degeneration IMPRESSION: 1. Possible cystitis. 2. Finding suggesting chronic bladder outlet obstruction. There is only mild bladder distention currently. 3. 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 4. Large hiatal hernia. Electronically Signed   By: Monte Fantasia M.D.   On: 03/31/2019 06:24   Dg Chest Port 1 View  Result Date: 04/02/2019 CLINICAL DATA:  85 year old male with fever and cough. EXAM: PORTABLE CHEST 1 VIEW COMPARISON:  Chest radiograph dated 03/31/2019 FINDINGS: Left lung base density, likely atelectasis. Infiltrate is not excluded. Clinical correlation is recommended. There is diffuse interstitial prominence similar prior radiograph. No new consolidative changes. No large pleural effusion or pneumothorax. Stable cardiac silhouette. Moderate size hiatal hernia. No acute osseous pathology. IMPRESSION: No interval change. Electronically Signed   By: Anner Crete M.D.   On: 04/02/2019 02:25   Dg Chest Portable 1 View  Result  Date: 03/31/2019 CLINICAL DATA:  Cough and dyspnea EXAM: PORTABLE CHEST 1 VIEW COMPARISON:  03/08/2018 chest x-ray and abdominal CT FINDINGS: Apparent cardiomegaly is primarily related to a large hiatal hernia based on prior. There is also scarring at the left lower lobe. Interstitial crowding at the bases. No edema, effusion, or pneumothorax. Study is limited by leftward rotation. IMPRESSION: 1. Low volume chest with atelectasis at the bases. 2. Large hiatal hernia with overlying scar/atelectasis limiting assessment of the left base. Electronically Signed   By: Monte Fantasia M.D.   On: 03/31/2019 05:06   US Abdomen Limited Ruq  Result Date: 03/31/2019 CLINICAL DATA:  Right upper quadrant pain.  Elevated LFTs. EXAM: ULTRASOUND ABDOMEN LIMITED RIGHT UPPER QUADRANT COMPARISON:  CT 03/31/2019. FINDINGS: Gallbladder: No gallstones or wall thickening visualized. No sonographic Murphy sign noted by sonographer. Common bile duct: Diameter: 5.4 mm Liver: No focal lesion identified. Within normal limits in parenchymal echogenicity. Portal vein is patent on color Doppler imaging with normal direction of blood flow towards the liver. Other: None. IMPRESSION: No acute abnormality. No gallstones or biliary distention. Liver appears normal. Electronically Signed   By: Marcello Moores  Register   On: 03/31/2019 07:41     CBC Recent Labs  Lab 03/31/19 0408 03/31/19 1610 04/02/19 0133 04/02/19 0516  WBC 4.7  --  2.6* 5.9  HGB 11.5* 10.4* 9.7* 9.2*  HCT 35.6* 31.0* 29.0* 28.0*  PLT 103*  --  73* 76*  MCV 93.4  --  92.1 93.0  MCH 30.2  --  30.8 30.6  MCHC 32.3  --  33.4 32.9  RDW 14.4  --  14.6 14.7  LYMPHSABS  0.2*  --  0.1*  --   MONOABS 0.1  --  0.0*  --   EOSABS 0.0  --  0.0  --   BASOSABS 0.0  --  0.0  --     Chemistries  Recent Labs  Lab 03/31/19 0408 04/02/19 0133 04/02/19 0516  NA 133* 132* 135  K 4.3 3.4* 2.9*  CL 100 102 105  CO2 17* 16* 17*  GLUCOSE 113* 88 76  BUN 43* 52* 47*  CREATININE  3.75* 3.69* 3.45*  CALCIUM 8.0* 7.4* 7.0*  MG  --   --  1.6*  AST 326* 217* 177*  ALT 230* 214* 189*  ALKPHOS 242* 244* 228*  BILITOT 1.9* 4.0* 4.1*   ------------------------------------------------------------------------------------------------------------------ estimated creatinine clearance is 13.4 mL/min (A) (by C-G formula based on SCr of 3.45 mg/dL (H)). ------------------------------------------------------------------------------------------------------------------ No results for input(s): HGBA1C in the last 72 hours. ------------------------------------------------------------------------------------------------------------------ No results for input(s): CHOL, HDL, LDLCALC, TRIG, CHOLHDL, LDLDIRECT in the last 72 hours. ------------------------------------------------------------------------------------------------------------------ No results for input(s): TSH, T4TOTAL, T3FREE, THYROIDAB in the last 72 hours.  Invalid input(s): FREET3 ------------------------------------------------------------------------------------------------------------------ No results for input(s): VITAMINB12, FOLATE, FERRITIN, TIBC, IRON, RETICCTPCT in the last 72 hours.  Coagulation profile Recent Labs  Lab 03/31/19 0456 04/02/19 0133  INR 1.0 1.0    No results for input(s): DDIMER in the last 72 hours.  Cardiac Enzymes No results for input(s): CKMB, TROPONINI, MYOGLOBIN in the last 168 hours.  Invalid input(s): CK ------------------------------------------------------------------------------------------------------------------ Invalid input(s): [place]   85 y.o. male with pertinent past medical history of CAD, chronic diastolic congestive heart failure, hyperlipidemia, hypertension, prostate cancer, CKD stage IV, chronic left hydronephrosis, and chronic elevated LFTs presenting to the ED with chief complaints of fever and abdominal pain.  1. Sepsis -patient's  blood cultures are positive for gram-negative rods Continue meropenem Discontinue  IV vancomycin Follow culture of the blood   2. Chronically elevated LFTs -improved from prior findings - Prior work up including ASMA, AMA, ANA, ceruloplasmin  negative, as well as hepatitis panel -Outpatient GI follow-up    3.   Acute chronic kidney disease stage IV -BUN/creatinine at baseline - Hx chronic hydro-ureter noted versus due to distal ureteral calculus with severe left renal atrophy -Continue IV fluids    4. Chronic diastolic congestive heart failure -no evidence of exacerbation - Last echo 03/2018 LVEF 60 to 65% - Continue Imdur, - Hold torsemide  5. Coronary Artery Disease  - ASA 81mg  PO daily  6. HLD  + Goal LDL<100 - Atorvastatin 40mg  PO qhs  7. HTN  + Goal BP <130/80 - Continue metoprolol and doxazosin  8. Hypothyroidism -continue Synthroid  9. DVT prophylaxis -scd's     Code Status Orders  (From admission, onward)         Start     Ordered   04/02/19 0247  Full code  Continuous     04/02/19 0251        Code Status History    Date Active Date Inactive Code Status Order ID Comments User Context   03/08/2018 1125 03/10/2018 2251 Full Code [id number]  Kathi Ludwig, MD Inpatient   07/20/2016 0614 07/23/2016 1828 Full Code [id number]  Harrie Foreman, MD Inpatient   06/25/2016 0615 06/30/2016 1617 Full Code [id number]  Harrie Foreman, MD Inpatient   07/20/2015 1200 07/23/2015 1548 Full Code [id number]  Epifanio Lesches, MD ED   06/03/2015 1517 06/07/2015 1218 Full Code [id number]  Bettey Costa, MD Inpatient  Advance Care Planning Activity           Consults none  DVT Prophylaxis  Lovenox   Lab Results  Component Value Date   PLT 76 (L) 04/02/2019     Time Spent in minutes   60min 11am to 1145 am Greater than 50% of time spent in care coordination and counseling patient regarding the condition and plan of care.   [name] M.D  on 04/02/2019 at 1:28 PM  Between 7am to 6pm - Pager - [phone]  After 6pm go to [email or web address] - Proofreader  [employer]   Office  [phone]

## 2019-04-02 NOTE — Progress Notes (Signed)
MD notified: potassium level is 2.9 this morning will you like to order replacement. Calcium level is 7.0. Last lactic acid was 2.7 and troponin was 61 when checked at 0352. Would you like to repeat the dose labs as well since there are no orders to recheck.

## 2019-04-02 NOTE — H&P (Signed)
[facility] [place] NAME: [name]    MR#:  [id number]  DATE OF BIRTH:  Mar 04, 1927  DATE OF ADMISSION:  04/02/2019  PRIMARY CARE PHYSICIAN: Care, [facility]   REQUESTING/REFERRING PHYSICIAN: Lurline Hare, MD  CHIEF COMPLAINT:   Chief Complaint  Patient presents with  . Fever    HISTORY OF PRESENT ILLNESS:  85 y.o. male with pertinent past medical history of CAD, chronic diastolic congestive heart failure, hyperlipidemia, hypertension, prostate cancer, CKD stage IV, chronic left hydronephrosis, and chronic elevated LFTs presenting to the ED with chief complaints of fever and abdominal pain.  Patient states his been having intermittent episodes of abdominal pain without associated nausea or vomiting, denies fever even though he was noted to have elevated temp of 102 on admission, no diarrhea or bloody stools.  He was recently seen in the ED on 03/31/2019 for similar complaints.  Work-up at that time including CT abdomen pelvis ultrasound right upper quadrant was unremarkable.  On arrival to the ED, he was febrile temp 102 with blood pressure 102/74 mm Hg and pulse rate 60 beats/min. There were no focal neurological deficits; he was alert and oriented x4.  Initial labs revealed lactic acid 3.8, sodium 132, potassium 3.4, BUN 52, creatinine 3.65, AST 217, ALT 214, alk phos 244, bilirubin 4.0, WBC 2.6, platelets 73, and lipase 33.  UA shows rare bacteria otherwise no evidence of active UTI.  PAST MEDICAL HISTORY:   Past Medical History:  Diagnosis Date  . Cardiac disease   . CHF (congestive heart failure) ([place])   . Hypercholesteremia   . Hypertension   . Kidney failure    left  . Osteoporosis   . Prostate cancer ([place])   . Shingles   . Skin cancer    right cheek    PAST SURGICAL HISTORY:   Past Surgical History:  Procedure Laterality Date  . CARDIAC CATHETERIZATION  04/13/2014  . CT PERC CHOLECYSTOSTOMY  07/20/2016   Placed  at [facility]    SOCIAL HISTORY:   Social History   Tobacco Use  . Smoking status: Former Smoker    Types: Cigarettes, Cigars  . Smokeless tobacco: Current User    Types: Chew  Substance Use Topics  . Alcohol use: No    Alcohol/week: 0.0 standard drinks    FAMILY HISTORY:   Family History  Problem Relation Age of Onset  . CAD Mother   . Cancer Mother   . Heart attack Father     DRUG ALLERGIES:   Allergies  Allergen Reactions  . Ativan [Lorazepam] Other (See Comments)    Hallucinations, combative  . Penicillin G Other (See Comments)    Has patient had a PCN reaction causing immediate rash, facial/tongue/throat swelling, SOB or lightheadedness with hypotension: Yes Has patient had a PCN reaction causing severe rash involving mucus membranes or skin necrosis: No Has patient had a PCN reaction that required hospitalization No Has patient had a PCN reaction occurring within the last 10 years: No If all of the above answers are "NO", then may proceed with Cephalosporin use.     REVIEW OF SYSTEMS:   Review of Systems  Constitutional: Negative for chills, fever, malaise/fatigue and weight loss.  HENT: Negative for congestion, hearing loss and sore throat.   Eyes: Negative for blurred vision and double vision.  Respiratory: Negative for cough, shortness of breath and wheezing.   Cardiovascular: Negative for chest pain, palpitations, orthopnea and leg swelling.  Gastrointestinal: Positive for abdominal pain. Negative for diarrhea, nausea and vomiting.  Genitourinary: Negative for dysuria and urgency.  Musculoskeletal: Negative for myalgias.  Skin: Negative for rash.  Neurological: Positive for weakness. Negative for dizziness, sensory change, speech change, focal weakness and headaches.  Psychiatric/Behavioral: Negative for depression.   MEDICATIONS AT HOME:   Prior to Admission medications   Medication Sig Start Date End Date Taking? Authorizing  Provider  Acetaminophen (TYLENOL PO) Take 1-2 tablets by mouth daily as needed (pain/headache).    [provider]  aspirin EC 81 MG tablet Take 81 mg by mouth at bedtime.    [provider]  atorvastatin (LIPITOR) 40 MG tablet Take 40 mg by mouth at bedtime.     [provider]  calcitRIOL (ROCALTROL) 0.25 MCG capsule Take 0.25 mcg by mouth See admin instructions. Take one capsule (0.25 mcg) by mouth every Monday, Wednesday, Friday night 02/06/18   [provider]  Cholecalciferol (VITAMIN D3) 5000 units TABS Take 5,000 Units by mouth daily.    [provider]  doxazosin (CARDURA) 4 MG tablet Take 4 mg by mouth at bedtime.     [provider]  ferrous sulfate 325 (65 FE) MG tablet Take 325 mg by mouth 2 (two) times daily.     [provider]  isosorbide mononitrate (IMDUR) 30 MG 24 hr tablet Take 1 tablet (30 mg total) by mouth daily. 03/11/18   [place], Carley D, DO  levothyroxine (SYNTHROID, LEVOTHROID) 88 MCG tablet Take 88 mcg by mouth daily. 02/11/18   [provider]  metoprolol succinate (TOPROL-XL) 50 MG 24 hr tablet Take 50 mg by mouth daily. Take with or immediately following a meal.    [provider]  Omega-3 Fatty Acids (FISH OIL) 1200 MG CAPS Take 1,200 mg by mouth 2 (two) times daily.    [provider]  pantoprazole (PROTONIX) 40 MG tablet Take 1 tablet (40 mg total) by mouth 2 (two) times daily. 03/10/18   [place], Carley D, DO  potassium chloride SA (K-DUR,KLOR-CON) 20 MEQ tablet Take 20 mEq by mouth 2 (two) times daily.  08/18/15   [provider]  sodium bicarbonate 650 MG tablet Take 1,300 mg by mouth 2 (two) times daily.     [provider]  tamsulosin (FLOMAX) 0.4 MG CAPS capsule Take 0.4 mg by mouth at bedtime.     [provider]  torsemide (DEMADEX) 20 MG tablet Take 40 mg by mouth daily.  10/27/15   [provider]      VITAL SIGNS:  Blood pressure  (!) 102/47, pulse 85, temperature 98.6 F (37 C), temperature source Oral, resp. rate (!) 22, weight 61.2 kg, SpO2 97 %.  PHYSICAL EXAMINATION:   Physical Exam  GENERAL:  85 y.o.-year-old patient lying in the bed with no acute distress.  EYES: Pupils equal, round, reactive to light and accommodation. No scleral icterus. Extraocular muscles intact.  HEENT: Head atraumatic, normocephalic. Oropharynx and nasopharynx clear.  NECK:  Supple, no jugular venous distention. No thyroid enlargement, no tenderness.  LUNGS: Normal breath sounds bilaterally, no wheezing, rales,rhonchi or crepitation. No use of accessory muscles of respiration.  CARDIOVASCULAR: S1, S2 normal. No murmurs, rubs, or gallops.  ABDOMEN: Soft, nontender, nondistended. Bowel sounds present. No organomegaly or mass.  EXTREMITIES: No pedal edema, cyanosis, or clubbing.  NEUROLOGIC: Cranial nerves II through XII are intact. Muscle strength 5/5 in all extremities. Sensation intact. Gait not checked.  PSYCHIATRIC: The patient is alert and oriented  x 3.  SKIN: No obvious rash, lesion, or ulcer.   DATA REVIEWED:  LABORATORY PANEL:   CBC Recent Labs  Lab 04/02/19 0133  WBC 2.6*  HGB 9.7*  HCT 29.0*  PLT 73*   ------------------------------------------------------------------------------------------------------------------  Chemistries  Recent Labs  Lab 04/02/19 0133  NA 132*  K 3.4*  CL 102  CO2 16*  GLUCOSE 88  BUN 52*  CREATININE 3.69*  CALCIUM 7.4*  AST 217*  ALT 214*  ALKPHOS 244*  BILITOT 4.0*   ------------------------------------------------------------------------------------------------------------------  Cardiac Enzymes No results for input(s): TROPONINI in the last 168 hours. ------------------------------------------------------------------------------------------------------------------  RADIOLOGY:  Ct Abdomen Pelvis Wo Contrast  Result Date: 03/31/2019 CLINICAL DATA:  Acute generalized  abdominal pain. EXAM: CT ABDOMEN AND PELVIS WITHOUT CONTRAST TECHNIQUE: Multidetector CT imaging of the abdomen and pelvis was performed following the standard protocol without IV contrast. COMPARISON:  03/08/2018 FINDINGS: Lower chest: Sizable hiatal hernia. Chronic trace pleural fluid at the left base. Coronary calcification. Hepatobiliary: No focal liver abnormality.No evidence of biliary obstruction or stone. Pancreas: Generalized atrophy. Spleen: Unremarkable. Adrenals/Urinary Tract: Negative adrenals. Chronic left hydroureteronephrosis due to distal ureteral calculus with severe left renal atrophy. The left kidney is likely nonfunctional. 15 mm left interpolar calculus. No right hydronephrosis. Thick walled bladder with subtle cellule/diverticulum. Mild perivesicular fat haziness. Stomach/Bowel:  No obstruction. Left colonic diverticulosis. Vascular/Lymphatic: No acute vascular abnormality. No mass or adenopathy. Haziness of fat in the sigmoid mesentery without mass or adenopathy-consistent with chronic mesenteric panniculitis. Reproductive:No acute finding Other: No ascites or pneumoperitoneum. Musculoskeletal: No acute abnormalities. Osteopenia and spinal degeneration IMPRESSION: 1. Possible cystitis. 2. Finding suggesting chronic bladder outlet obstruction. There is only mild bladder distention currently. 3. 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 4. Large hiatal hernia. Electronically Signed   By: Monte Fantasia M.D.   On: 03/31/2019 06:24   Dg Chest Port 1 View  Result Date: 04/02/2019 CLINICAL DATA:  85 year old male with fever and cough. EXAM: PORTABLE CHEST 1 VIEW COMPARISON:  Chest radiograph dated 03/31/2019 FINDINGS: Left lung base density, likely atelectasis. Infiltrate is not excluded. Clinical correlation is recommended. There is diffuse interstitial prominence similar prior radiograph. No new consolidative changes. No large pleural effusion or pneumothorax.  Stable cardiac silhouette. Moderate size hiatal hernia. No acute osseous pathology. IMPRESSION: No interval change. Electronically Signed   By: Anner Crete M.D.   On: 04/02/2019 02:25   Dg Chest Portable 1 View  Result Date: 03/31/2019 CLINICAL DATA:  Cough and dyspnea EXAM: PORTABLE CHEST 1 VIEW COMPARISON:  03/08/2018 chest x-ray and abdominal CT FINDINGS: Apparent cardiomegaly is primarily related to a large hiatal hernia based on prior. There is also scarring at the left lower lobe. Interstitial crowding at the bases. No edema, effusion, or pneumothorax. Study is limited by leftward rotation. IMPRESSION: 1. Low volume chest with atelectasis at the bases. 2. Large hiatal hernia with overlying scar/atelectasis limiting assessment of the left base. Electronically Signed   By: Monte Fantasia M.D.   On: 03/31/2019 05:06   US Abdomen Limited Ruq  Result Date: 03/31/2019 CLINICAL DATA:  Right upper quadrant pain.  Elevated LFTs. EXAM: ULTRASOUND ABDOMEN LIMITED RIGHT UPPER QUADRANT COMPARISON:  CT 03/31/2019. FINDINGS: Gallbladder: No gallstones or wall thickening visualized. No sonographic Murphy sign noted by sonographer. Common bile duct: Diameter: 5.4 mm Liver: No focal lesion identified. Within normal limits in parenchymal echogenicity. Portal vein is patent on color Doppler imaging with normal direction of blood flow towards the liver. Other: None.  IMPRESSION: No acute abnormality. No gallstones or biliary distention. Liver appears normal. Electronically Signed   By: Marcello Moores  Register   On: 03/31/2019 07:41    EKG:  EKG: unchanged from previous tracings, sinus tachycardia. Vent. rate 97 BPM PR interval * ms QRS duration 153 ms QT/QTc 388/493 ms P-R-T axes -34 68 6 IMPRESSION AND PLAN:   85 y.o. male with pertinent past medical history of CAD, chronic diastolic congestive heart failure, hyperlipidemia, hypertension, prostate cancer, CKD stage IV, chronic left hydronephrosis, and chronic  elevated LFTs presenting to the ED with chief complaints of fever and abdominal pain.  1. Sepsis - Patient meets SIRS criteriaTemperature 102, and leukopenia with a lactic acid of 3.8 - Suspect UTI as source of infection - Admit to MedSurg unit - Recent CT abdomen pelvis showed possible cystitis - [place] RUQ shows no gallstones or biliary distention - UA shows no evidence of UTI - Monitor fever curve - Urine cultures pending - Blood cultures pending - Start Empiric abx with vancomycin and meropenem  2. Chronically elevated LFTs -improved from prior findings - Prior work up including ASMA, AMA, ANA, ceruloplasmin  negative, thought to be due to ischemic injury rather than chronic liver disease - Continue to trend LFTs  3. Chronic kidney disease stage IV -BUN/creatinine at baseline - Hx chronic hydro-ureter noted versus due to distal ureteral calculus with severe left renal atrophy - Nephrology following  4. Chronic diastolic congestive heart failure -no evidence of exacerbation - Last echo 03/2018 LVEF 60 to 65% - Continue Imdur, - Continue torsemide  5. Coronary Artery Disease  - ASA 66m PO daily  6. HLD  + Goal LDL<100 - Atorvastatin 434mPO qhs  7. HTN  + Goal BP <130/80 - Continue metoprolol and doxazosin  8. Hypothyroidism -continue Synthroid  9. DVT prophylaxis - Hold anti-coagulation for thrombocytopenia PLTs<100 - Place SCDs   All the records are reviewed and case discussed with ED provider. Management plans discussed with the patient, family and they are in agreement.  CODE STATUS: FULL  TOTAL TIME TAKING CARE OF THIS PATIENT: 50 minutes.    on 04/02/2019 at 2:51 AM  ElRufina FalcoDNP, FNP-BC [facility] Nurse Practitioner Between 7am to 6pm - Pager - [phone] 6pm go to [email or web address] - password [email or web address]  Office  [phone]: Primary care physician; Care, [place]

## 2019-04-02 NOTE — Progress Notes (Signed)
MD notified: Would you be ok with ordering chest x-ray and speech eval. the patient sounds more wet than this morning and just when taking his meds he starts to cough.

## 2019-04-02 NOTE — Progress Notes (Signed)
Received from ER to room 206 via stretcher. Assisted to bed and positioned for comfort. In no acute distress. Oriented to room, bed and unit.

## 2019-04-02 NOTE — Progress Notes (Signed)
PHARMACY NOTE:  ANTIMICROBIAL RENAL DOSAGE ADJUSTMENT  Current antimicrobial regimen includes a mismatch between antimicrobial dosage and estimated renal function.  As per policy approved by the Pharmacy & Therapeutics and Medical Executive Committees, the antimicrobial dosage will be adjusted accordingly.  Current antimicrobial dosage:  Meropenem 1gm q12h  Indication: E. Coli bacteremia  Renal Function:  Estimated Creatinine Clearance: 13.4 mL/min (A) (by C-G formula based on SCr of 3.45 mg/dL (H)). []      On intermittent HD, scheduled: []      On CRRT    Antimicrobial dosage has been changed to:  Meropenem 500mg  IV q12h  Additional comments:   Thank you for allowing pharmacy to be a part of this patient's care.  Doreene Eland, PharmD, BCPS.   Work Cell: [phone] 04/02/2019 2:52 PM

## 2019-04-03 ENCOUNTER — Inpatient Hospital Stay: Payer: Medicare Other

## 2019-04-03 DIAGNOSIS — N1330 Unspecified hydronephrosis: Secondary | ICD-10-CM

## 2019-04-03 DIAGNOSIS — Z87891 Personal history of nicotine dependence: Secondary | ICD-10-CM

## 2019-04-03 DIAGNOSIS — R7881 Bacteremia: Secondary | ICD-10-CM

## 2019-04-03 DIAGNOSIS — B9620 Unspecified Escherichia coli [E. coli] as the cause of diseases classified elsewhere: Secondary | ICD-10-CM

## 2019-04-03 DIAGNOSIS — D696 Thrombocytopenia, unspecified: Secondary | ICD-10-CM

## 2019-04-03 DIAGNOSIS — A419 Sepsis, unspecified organism: Secondary | ICD-10-CM

## 2019-04-03 DIAGNOSIS — D72819 Decreased white blood cell count, unspecified: Secondary | ICD-10-CM

## 2019-04-03 DIAGNOSIS — K449 Diaphragmatic hernia without obstruction or gangrene: Secondary | ICD-10-CM

## 2019-04-03 DIAGNOSIS — Z880 Allergy status to penicillin: Secondary | ICD-10-CM

## 2019-04-03 DIAGNOSIS — E440 Moderate protein-calorie malnutrition: Secondary | ICD-10-CM | POA: Insufficient documentation

## 2019-04-03 DIAGNOSIS — D61818 Other pancytopenia: Secondary | ICD-10-CM

## 2019-04-03 DIAGNOSIS — R509 Fever, unspecified: Secondary | ICD-10-CM

## 2019-04-03 DIAGNOSIS — R350 Frequency of micturition: Secondary | ICD-10-CM

## 2019-04-03 DIAGNOSIS — R945 Abnormal results of liver function studies: Secondary | ICD-10-CM

## 2019-04-03 DIAGNOSIS — Z8546 Personal history of malignant neoplasm of prostate: Secondary | ICD-10-CM

## 2019-04-03 DIAGNOSIS — Z888 Allergy status to other drugs, medicaments and biological substances status: Secondary | ICD-10-CM

## 2019-04-03 DIAGNOSIS — Z7189 Other specified counseling: Secondary | ICD-10-CM

## 2019-04-03 DIAGNOSIS — I130 Hypertensive heart and chronic kidney disease with heart failure and stage 1 through stage 4 chronic kidney disease, or unspecified chronic kidney disease: Secondary | ICD-10-CM

## 2019-04-03 DIAGNOSIS — Z515 Encounter for palliative care: Secondary | ICD-10-CM

## 2019-04-03 DIAGNOSIS — Z8719 Personal history of other diseases of the digestive system: Secondary | ICD-10-CM

## 2019-04-03 DIAGNOSIS — I509 Heart failure, unspecified: Secondary | ICD-10-CM

## 2019-04-03 DIAGNOSIS — D649 Anemia, unspecified: Secondary | ICD-10-CM

## 2019-04-03 DIAGNOSIS — N189 Chronic kidney disease, unspecified: Secondary | ICD-10-CM

## 2019-04-03 HISTORY — DX: Diaphragmatic hernia without obstruction or gangrene: K44.9

## 2019-04-03 LAB — CBC
HCT: 26.3 % — ABNORMAL LOW (ref 39.0–52.0)
Hemoglobin: 8.8 g/dL — ABNORMAL LOW (ref 13.0–17.0)
MCH: 30.4 pg (ref 26.0–34.0)
MCHC: 33.5 g/dL (ref 30.0–36.0)
MCV: 91 fL (ref 80.0–100.0)
Platelets: 63 10*3/uL — ABNORMAL LOW (ref 150–400)
RBC: 2.89 MIL/uL — ABNORMAL LOW (ref 4.22–5.81)
RDW: 14.6 % (ref 11.5–15.5)
WBC: 7.3 10*3/uL (ref 4.0–10.5)
nRBC: 0 % (ref 0.0–0.2)

## 2019-04-03 LAB — COMPREHENSIVE METABOLIC PANEL
ALT: 160 U/L — ABNORMAL HIGH (ref 0–44)
AST: 133 U/L — ABNORMAL HIGH (ref 15–41)
Albumin: 2 g/dL — ABNORMAL LOW (ref 3.5–5.0)
Alkaline Phosphatase: 206 U/L — ABNORMAL HIGH (ref 38–126)
Anion gap: 10 (ref 5–15)
BUN: 48 mg/dL — ABNORMAL HIGH (ref 8–23)
CO2: 18 mmol/L — ABNORMAL LOW (ref 22–32)
Calcium: 7.3 mg/dL — ABNORMAL LOW (ref 8.9–10.3)
Chloride: 107 mmol/L (ref 98–111)
Creatinine, Ser: 3.39 mg/dL — ABNORMAL HIGH (ref 0.61–1.24)
GFR calc Af Amer: 18 mL/min — ABNORMAL LOW (ref >60)
GFR calc non Af Amer: 15 mL/min — ABNORMAL LOW (ref >60)
Glucose, Bld: 96 mg/dL (ref 70–99)
Potassium: 4.5 mmol/L (ref 3.5–5.1)
Sodium: 135 mmol/L (ref 135–145)
Total Bilirubin: 4 mg/dL — ABNORMAL HIGH (ref 0.3–1.2)
Total Protein: 4.5 g/dL — ABNORMAL LOW (ref 6.5–8.1)

## 2019-04-03 LAB — URINE CULTURE: Culture: NO GROWTH

## 2019-04-03 LAB — MAGNESIUM: Magnesium: 2.3 mg/dL (ref 1.7–2.4)

## 2019-04-03 LAB — PHOSPHORUS: Phosphorus: 4.5 mg/dL (ref 2.5–4.6)

## 2019-04-03 LAB — FERRITIN: Ferritin: 950 ng/mL — ABNORMAL HIGH (ref 24–336)

## 2019-04-03 MED ORDER — ENSURE ENLIVE PO LIQD
237.0000 mL | Freq: Two times a day (BID) | ORAL | Status: DC
  Administered 2019-04-03 – 2019-04-07 (×7): 237 mL via ORAL

## 2019-04-03 MED ORDER — ADULT MULTIVITAMIN W/MINERALS CH
1.0000 | ORAL_TABLET | Freq: Every day | ORAL | Status: DC
  Administered 2019-04-04 – 2019-04-07 (×3): 1 via ORAL
  Filled 2019-04-03 (×3): qty 1

## 2019-04-03 MED ORDER — ACETAMINOPHEN 325 MG PO TABS
650.0000 mg | ORAL_TABLET | Freq: Four times a day (QID) | ORAL | Status: DC | PRN
  Administered 2019-04-03 – 2019-04-05 (×3): 650 mg via ORAL
  Filled 2019-04-03 (×3): qty 2

## 2019-04-03 NOTE — Progress Notes (Signed)
MD notified about stepson being concerned about monitoring the hemoglobin levels for this patient as he indicates the patient had a black tarry stool at home. The patient has not had any black stool while in the hospital and has only had dark green stools. The patient is currently on iron.

## 2019-04-03 NOTE — TOC Initial Note (Addendum)
Transition of Care [facility]) - Initial/Assessment Note    Patient Details  Name: [name] MRN: [id number] Date of Birth: 1926-10-22  Transition of Care [facility]) CM/SW Contact:    Kirk Sessions, RN Phone Number: 04/03/2019, 9:54 AM  Clinical Narrative:                 Patient admitted from home with sepsis  Patient lives at home with wife.  Grandson lives there as well, but he works during the day  Step son, and his wife live locally.  They or the grandson provide transportation to appointments  [facility]  Denies issues obtaining medications  Patient open with [name].  [name] Sheppard of admission  Patient has RW at home  PT has assessed patient and recommends SNF.  Discussed with patient.  He requests that I speak with his family and let them make the decision.  RNCM spoke with wife and step son [name]. They are both in agreement for SNF.  Bed search initiated.  [name] requests [name] if an option.  Message left for [name] at [name] Bed search initiated  [place] Sheppard sent for signature   UPDATE:  Bed offers presented.  Patient request for family to make the decision on the place.  Son and wife have selected Radiation protection practitioner.  Accepted in [place].  [name] at [name]    Expected Discharge Plan: [place] Barriers to Discharge: Continued Medical Work up   Patient Goals and CMS Choice        Expected Discharge Plan and Services Expected Discharge Plan: [place]       Living arrangements for the past 2 months: Single Family Home                               Date [place]: 04/03/19   Representative spoke with at [place]: [name]  Prior Living Arrangements/Services Living arrangements for the past 2 months: [place] Lives with:: Spouse, Relatives Patient language and need for interpreter reviewed:: Yes        Need for  Family Participation in Patient Care: Yes (Comment) Care giver support system in place?: Yes (comment) Current home services: DME, Home PT, Homehealth aide Criminal Activity/Legal Involvement Pertinent to Current Situation/Hospitalization: No - Comment as needed  Activities of Daily Living Home Assistive Devices/Equipment: Cane (specify quad or straight), Walker (specify type) ADL Screening (condition at time of admission) Patient's cognitive ability adequate to safely complete daily activities?: Yes Is the patient deaf or have difficulty hearing?: No Does the patient have difficulty seeing, even when wearing glasses/contacts?: Yes Does the patient have difficulty concentrating, remembering, or making decisions?: No Patient able to express need for assistance with ADLs?: Yes Does the patient have difficulty dressing or bathing?: Yes Independently performs ADLs?: No Communication: Independent Dressing (OT): Needs assistance Is this a change from baseline?: Pre-admission baseline Grooming: Needs assistance Is this a change from baseline?: Pre-admission baseline Feeding: Independent Bathing: Needs assistance Is this a change from baseline?: Pre-admission baseline Toileting: Needs assistance Is this a change from baseline?: Pre-admission baseline In/Out Bed: Needs assistance Is this a change from baseline?: Pre-admission baseline Walks in Home: Needs assistance Is this a change from baseline?: Pre-admission baseline Does the patient have difficulty walking or climbing stairs?: Yes Weakness of Legs: Both Weakness of Arms/Hands: None  Permission Sought/Granted  Emotional Assessment Appearance:: Appears stated age     Orientation: : Oriented to Self, Oriented to Place, Oriented to  Time, Oriented to Situation   Psych Involvement: No (comment)  Admission diagnosis:  Febrile illness [R50.9] Pancytopenia ([place]) [D61.818] Sepsis, due to unspecified organism,  unspecified whether acute organ dysfunction present [facility]) [A41.9] Patient Active Problem List   Diagnosis Date Noted  . Chronic diastolic (congestive) heart failure ([place]) 03/31/2019  . Malignant neoplasm of prostate ([place]) 03/31/2019  . Weakness   . Elevated LFTs   . Chronic kidney disease, stage 4 (severe) ([place])   . Aortic atherosclerosis ([place])   . Bilateral nephrolithiasis   . Hydronephrosis concurrent with and due to calculi of kidney and ureter   . Acute encephalopathy 03/08/2018  . Gallbladder abscess 07/31/2016  . Cholecystitis 07/20/2016  . Hematuria 06/25/2016  . Sepsis ([place]) 06/25/2016  . Acute cholecystitis   . Hypokalemia 07/23/2015  . Generalized weakness 07/23/2015  . Bladder outlet obstruction 07/23/2015  . Acute delirium 07/23/2015  . Benign essential HTN 07/23/2015  . Acute on chronic renal failure ([place]) 07/20/2015  . GIB (gastrointestinal bleeding) 06/03/2015  . Kidney stone 05/25/2012  . Ureteric stone 05/25/2012  . Hydronephrosis 05/25/2012  . Chronic kidney disease, stage IV (severe) ([place]) 05/25/2012   PCP:  Care, [place] Sheppard:   [place], [place] - [address] [place] [address] Phone: [phone] Fax: [phone]  [id number] - [place], [place] - [address] [place] [address] [place] [address] Phone: [phone] Fax: [phone]  [facility] [place] [place] [address] [place] [address] Phone: [phone] Fax: [phone]  [id number] [address], [place] [address] Phone: [phone] Fax: [phone]     Social Determinants of Health (SDOH) Interventions    Readmission Risk Interventions Readmission Risk Prevention Plan 04/03/2019  Transportation Screening Complete  Medication Review (RN Care Manager)  Complete  Some recent data might be hidden

## 2019-04-03 NOTE — Evaluation (Signed)
Clinical/Bedside Swallow Evaluation Patient Details  Name: [name] MRN: [id number] Date of Birth: 1927-05-19  Today's Date: 04/03/2019 Time: SLP Start Time (ACUTE ONLY): 1000 SLP Stop Time (ACUTE ONLY): 1100 SLP Time Calculation (min) (ACUTE ONLY): 60 min  Past Medical History:  Past Medical History:  Diagnosis Date  . Cardiac disease   . CHF (congestive heart failure) ([place])   . Hypercholesteremia   . Hypertension   . Kidney failure    left  . Osteoporosis   . Prostate cancer ([place])   . Shingles   . Skin cancer    right cheek   Past Surgical History:  Past Surgical History:  Procedure Laterality Date  . CARDIAC CATHETERIZATION  04/13/2014  . CT PERC CHOLECYSTOSTOMY  07/20/2016   Placed at [facility] Interventional Radiology   HPI:  Pt is an 85 y.o. male with pertinent past medical history of Large Hiatal Hernia per CT of Abd., CAD, chronic diastolic congestive heart failure, hyperlipidemia, hypertension, Vision deficits, prostate cancer, CKD stage IV, chronic left hydronephrosis, and chronic elevated LFTs presenting to the ED with chief complaints of fever and abdominal pain. Patient states his been having intermittent episodes of abdominal pain without associated nausea or vomiting, denies fever even though he was noted to have elevated temp of 102 on admission, and has had dark and/or bloody stools w/ recent discomfort with urination.  He was recently seen in the ED on 03/31/2019 for similar complaints.  Pt denies any difficulty swallowing and eats a regular diet at home --- "just not as much as I used to"; "I'm an old man now".    Assessment / Plan / Recommendation Clinical Impression  Pt appears to present w/ adequate oropharyngeal phase swallow function w/ NO overt s/s of aspiration noted during oral intake/trials at bedside. Pt appears at reduced risk for aspiration when following general aspiration precautions. HOWEVER, pt DOES have a Large Hiatal Hernia per CT of Abd/chart  notes which CAN increased Esophageal dysmotility and Regurgitation thus increase risk for aspiration of REFLUX material which can in turn impact the Pulmonary status. Pt did not c/o Globus feelings during this assessment; he stated he did not eat much at home anymore d/t being an "old man now". Discussed w/ him the need to follow general REFLUX precautions as discussed w/ him; lessen dense solids difficult to chew w/ his Dentures to lessen Esophageal dysmotility. Recommend f/u w/ GI for further education, managment. During po trials given, pt consumed ice chips, thin liquids via cup and straw, and purees w/ no overt s/s of aspiration noted; no decline in vocal quality or respiratory status during/post trials. During the oral phase, pt exhibited adequate bolus management and timely A-P transfer w/ all trials; full oral clearing noted post trials achieved. OM exam appeared [facility] w/ no unilateral weakness noted. Pt is missing Dentition; he usually wears them w/ meals --- family is bringing them into the hospital today. Solid foods/trials were not attempted. Pt fed self w/ setup but w/ support he did well despite the Vision deficits.  Recommend modifying diet to a mech soft consistency w/ thin liquids until pt gets his Dentures; general aspiration precautions; strict REFLUX precautions d/t large Hiatal Hernia; Pills in Puree - Crushed as needed for easier Esophageal clearing.  No further ST services indicated as pt appears at his baseline.  SLP Visit Diagnosis: Dysphagia, pharyngoesophageal phase (R13.14)(Large Hiatal Hernia)    Aspiration Risk  (reduced following general precautions)    Diet Recommendation  Mech Soft  diet consistency d/t Large Hiatal Hernia; Thin liquids. STRICT REFLUX PRECAUTIONS; general aspiration precautions; setup at meals for support d/t Vision deficits  Medication Administration: Crushed with puree(or in liquid, chewable forms for Esophageal clearing)    Other  Recommendations  Recommended Consults: Consider GI evaluation;Consider esophageal assessment(Dietician f/u for support) Oral Care Recommendations: Oral care BID;Patient independent with oral care(setup and support) Other Recommendations: (n/a)   Follow up Recommendations None      Frequency and Duration (n/a)  (n/a)       Prognosis Prognosis for Safe Diet Advancement: Fair(-Good) Barriers to Reach Goals: Time post onset(Large Hiatal Hernia)      Swallow Study   General Date of Onset: 04/02/19 HPI: Pt is an 85 y.o. male with pertinent past medical history of Large Hiatal Hernia per CT of Abd., CAD, chronic diastolic congestive heart failure, hyperlipidemia, hypertension, Vision deficits, prostate cancer, CKD stage IV, chronic left hydronephrosis, and chronic elevated LFTs presenting to the ED with chief complaints of fever and abdominal pain. Patient states his been having intermittent episodes of abdominal pain without associated nausea or vomiting, denies fever even though he was noted to have elevated temp of 102 on admission, and has had dark and/or bloody stools w/ recent discomfort with urination.  He was recently seen in the ED on 03/31/2019 for similar complaints.  Pt denies any difficulty swallowing and eats a regular diet at home --- "just not as much as I used to"; "I'm an old man now".  Type of Study: Bedside Swallow Evaluation Previous Swallow Assessment: none; though per CT of abd. in past, pt has a Large Hiatal Hernia Diet Prior to this Study: Regular;Thin liquids Temperature Spikes Noted: (at admission but 98.2 currently; wbc normal) Respiratory Status: Room air History of Recent Intubation: No Behavior/Cognition: Alert;Cooperative;Pleasant mood(min HOH) Oral Cavity Assessment: Within Functional Limits Oral Care Completed by SLP: Recent completion by staff Oral Cavity - Dentition: Edentulous(son brining the Dentures in to hospital) Vision: Functional for self-feeding(Fair -- declined at  baseline but w/ setup [facility]) Self-Feeding Abilities: Able to feed self;Needs set up;Needs assist Patient Positioning: Upright in chair Baseline Vocal Quality: Normal Volitional Cough: Strong Volitional Swallow: Able to elicit    Oral/Motor/Sensory Function Overall Oral Motor/Sensory Function: Within functional limits   Ice Chips Ice chips: Within functional limits Presentation: Spoon(fed; 2 trials)   Thin Liquid Thin Liquid: Within functional limits Presentation: Cup;Self Fed;Straw(~8+ ozs)    Nectar Thick Nectar Thick Liquid: Not tested   Honey Thick Honey Thick Liquid: Not tested   Puree Puree: Within functional limits Presentation: Self Fed;Spoon(~3-4 ozs total)   Solid     Solid: Not tested Other Comments: pt declined; he also did not have his dentures placed (Son bringing them)         Orinda Kenner, MS, CCC-SLP Lyla Jasek 04/03/2019,12:57 PM

## 2019-04-03 NOTE — NC FL2 (Signed)
[place] LEVEL OF CARE SCREENING TOOL     IDENTIFICATION  Patient Name: [name] Birthdate: 09/21/1926 Sex: male Admission Date (Current Location): 04/02/2019  [facility] and [place] Number:  Engineering geologist and Address:         Provider Number: [id number]  Attending Physician Name and Address:  [name], MD  Relative Name and Phone Number:       Current Level of Care: Hospital Recommended Level of Care: [place] Prior Approval Number:    Date Approved/Denied:   PASRR Number: [id number]  Discharge Plan: SNF    Current Diagnoses: Patient Active Problem List   Diagnosis Date Noted  . Chronic diastolic (congestive) heart failure ([place]) 03/31/2019  . Malignant neoplasm of prostate ([place]) 03/31/2019  . Weakness   . Elevated LFTs   . Chronic kidney disease, stage 4 (severe) ([place])   . Aortic atherosclerosis ([place])   . Bilateral nephrolithiasis   . Hydronephrosis concurrent with and due to calculi of kidney and ureter   . Acute encephalopathy 03/08/2018  . Gallbladder abscess 07/31/2016  . Cholecystitis 07/20/2016  . Hematuria 06/25/2016  . Sepsis ([place]) 06/25/2016  . Acute cholecystitis   . Hypokalemia 07/23/2015  . Generalized weakness 07/23/2015  . Bladder outlet obstruction 07/23/2015  . Acute delirium 07/23/2015  . Benign essential HTN 07/23/2015  . Acute on chronic renal failure ([place]) 07/20/2015  . GIB (gastrointestinal bleeding) 06/03/2015  . Kidney stone 05/25/2012  . Ureteric stone 05/25/2012  . Hydronephrosis 05/25/2012  . Chronic kidney disease, stage IV (severe) ([place]) 05/25/2012    Orientation RESPIRATION BLADDER Height & Weight     Self, Time, Situation, Place  Normal Incontinent Weight: 65.3 kg Height:  5\' 8"  (172.7 cm)  BEHAVIORAL SYMPTOMS/MOOD NEUROLOGICAL BOWEL NUTRITION STATUS      Incontinent Diet(Heart Healthy)  AMBULATORY STATUS COMMUNICATION OF NEEDS Skin   Limited Assist Verbally Normal                       Personal Care Assistance Level of Assistance  Bathing, Feeding, Dressing Bathing Assistance: Independent Feeding assistance: Independent Dressing Assistance: Limited assistance     Functional Limitations Info  Sight Sight Info: Impaired        SPECIAL CARE FACTORS FREQUENCY  PT (By licensed PT), OT (By licensed OT)                    Contractures Contractures Info: Not present    Additional Factors Info  Code Status, Allergies Code Status Info: Full Allergies Info: Ativan, Penicillin G           Current Medications (04/03/2019):  This is the current hospital active medication list Current Facility-Administered Medications  Medication Dose Route Frequency Provider Last Rate Last Dose  . acetaminophen (TYLENOL) tablet 650 mg  650 mg Oral Q6H PRN [name], MD   650 mg at 04/03/19 0911  . aspirin EC tablet 81 mg  81 mg Oral QHS [name], MD   81 mg at 04/02/19 2117  . calcitRIOL (ROCALTROL) capsule 0.25 mcg  0.25 mcg Oral Once per day on Mon Wed Fri [name], MD      . cholecalciferol (VITAMIN D3) tablet 5,000 Units  5,000 Units Oral Daily [name], MD   5,000 Units at 04/03/19 [phone]  . doxazosin (CARDURA) tablet 4 mg  4 mg Oral QHS [name], MD   4 mg at 04/02/19 2117  . ferrous sulfate tablet  325 mg  325 mg Oral BID [name], MD   325 mg at 04/03/19 0845  . influenza vaccine adjuvanted (FLUAD) injection 0.5 mL  0.5 mL Intramuscular Tomorrow-1000 Ouma, Bing Neighbors, NP      . isosorbide mononitrate (IMDUR) 24 hr tablet 30 mg  30 mg Oral Daily [name], MD   30 mg at 04/03/19 0844  . levothyroxine (SYNTHROID) tablet 88 mcg  88 mcg Oral Daily [name], MD   88 mcg at 04/03/19 0845  . meropenem (MERREM) 500 mg in sodium chloride 0.9 % 100 mL IVPB  500 mg Intravenous Q12H Berton Mount, RPH   Stopped at 04/03/19 0320  . omega-3 acid ethyl esters (LOVAZA) capsule 1 g  1 g Oral Daily [name], MD   1 g at 04/03/19 0844  . pantoprazole (PROTONIX) EC tablet 40 mg  40 mg Oral BID [name], MD   40 mg at 04/03/19 0845  . sodium bicarbonate tablet 1,300 mg  1,300 mg Oral BID [name], MD   1,300 mg at 04/03/19 0844  . tamsulosin (FLOMAX) capsule 0.4 mg  0.4 mg Oral QHS [name], MD   0.4 mg at 04/02/19 2118     Discharge Medications: Please see discharge summary for a list of discharge medications.  Relevant Imaging Results:  Relevant Lab Results:   Additional Information SS: [id number]  Beverly Sessions, RN

## 2019-04-03 NOTE — Consult Note (Signed)
PHARMACY CONSULT NOTE - FOLLOW UP  Pharmacy Consult for Electrolyte Monitoring and Replacement   Recent Labs: Potassium (mmol/L)  Date Value  04/03/2019 4.5  03/07/2012 3.4 (L)   Magnesium (mg/dL)  Date Value  04/03/2019 2.3  03/06/2012 1.9   Calcium (mg/dL)  Date Value  04/03/2019 7.3 (L)   Calcium, Total (mg/dL)  Date Value  03/07/2012 8.2 (L)   Albumin (g/dL)  Date Value  04/03/2019 2.0 (L)  03/05/2012 3.5   Phosphorus (mg/dL)  Date Value  04/03/2019 4.5   Sodium (mmol/L)  Date Value  04/03/2019 135  03/07/2012 142    Assessment: 85 y.o. male with pertinent past medical history of CAD, chronic diastolic congestive heart failure, hyperlipidemia, hypertension, prostate cancer, CKD stage IV, chronic left hydronephrosis, and chronic elevated LFTs presenting to the ED with chief complaints of fever and abdominal pain.  Pharmacy has been consulted to monitor and replenish electrolytes.  Goal of Therapy:  Electrolytes wnl's  Plan:  No further electrolyte supplementation warranted at this time   Will check electrolytes with am labs (pharmacy will sign off if no further replenishment needed)  Lu Duffel, PharmD, BCPS Clinical Pharmacist 04/03/2019 7:54 AM

## 2019-04-03 NOTE — Progress Notes (Signed)
[place] at [facility]                                                                                                                                                                                  Patient Demographics   [name], is a 85 y.o. male, DOB - 1927-07-01, EPP:[id number]  Admit date - 04/02/2019   Admitting Physician Lang Snow, NP  Outpatient Primary MD for the patient is Care, Binger   LOS - 1  Subjective: Patient currently denying any symptoms  Review of Systems:   CONSTITUTIONAL: No documented fever. No fatigue, weakness. No weight gain, no weight loss.  EYES: No blurry or double vision.  ENT: No tinnitus. No postnasal drip. No redness of the oropharynx.  RESPIRATORY: No cough, no wheeze, no hemoptysis. No dyspnea.  CARDIOVASCULAR: No chest pain. No orthopnea. No palpitations. No syncope.  GASTROINTESTINAL: No nausea, no vomiting or diarrhea. No abdominal pain. No melena or hematochezia.  GENITOURINARY: No dysuria or hematuria.  ENDOCRINE: No polyuria or nocturia. No heat or cold intolerance.  HEMATOLOGY: No anemia. No bruising. No bleeding.  INTEGUMENTARY: No rashes. No lesions.  MUSCULOSKELETAL: No arthritis. No swelling. No gout.  NEUROLOGIC: No numbness, tingling, or ataxia. No seizure-type activity.  PSYCHIATRIC: No anxiety. No insomnia. No ADD.    Vitals:   Vitals:   04/02/19 1903 04/02/19 1948 04/02/19 2015 04/03/19 0457  BP: 99/62  98/62 (!) 111/56  Pulse: (!) 111 65 62 61  Resp: 18  20 20   Temp: 97.8 F (36.6 C)  98.1 F (36.7 C) 98.2 F (36.8 C)  TempSrc: Oral  Oral Oral  SpO2: 97%  99% 95%  Weight:      Height:        Wt Readings from Last 3 Encounters:  04/02/19 65.3 kg  03/31/19 67 kg  05/28/18 66 kg     Intake/Output Summary (Last 24 hours) at 04/03/2019 1243 Last data filed at 04/03/2019 0919 Gross per 24 hour  Intake 100 ml  Output 1000 ml  Net -900 ml    Physical Exam:    GENERAL: Pleasant-appearing in no apparent distress.  HEAD, EYES, EARS, NOSE AND THROAT: Atraumatic, normocephalic. Extraocular muscles are intact. Pupils equal and reactive to light. Sclerae anicteric. No conjunctival injection. No oro-pharyngeal erythema.  NECK: Supple. There is no jugular venous distention. No bruits, no lymphadenopathy, no thyromegaly.  HEART: Regular rate and rhythm,. No murmurs, no rubs, no clicks.  LUNGS: Clear to auscultation bilaterally. No rales or rhonchi. No wheezes.  ABDOMEN: Soft, flat, nontender, nondistended. Has good bowel sounds. No hepatosplenomegaly appreciated.  EXTREMITIES: No evidence of any cyanosis, clubbing, or  peripheral edema.  +2 pedal and radial pulses bilaterally.  NEUROLOGIC: The patient is alert, awake, and oriented x3 with no focal motor or sensory deficits appreciated bilaterally.  SKIN: Moist and warm with no rashes appreciated.  Psych: Not anxious, depressed LN: No inguinal LN enlargement    Antibiotics   Anti-infectives (From admission, onward)   Start     Dose/Rate Route Frequency Ordered Stop   04/03/19 1500  vancomycin (VANCOCIN) 500 mg in sodium chloride 0.9 % 100 mL IVPB  Status:  Discontinued     500 mg 100 mL/hr over 60 Minutes Intravenous Every 36 hours 04/02/19 0528 04/02/19 1215   04/03/19 0200  meropenem (MERREM) 500 mg in sodium chloride 0.9 % 100 mL IVPB     500 mg 200 mL/hr over 30 Minutes Intravenous Every 12 hours 04/02/19 1450     04/02/19 0230  meropenem (MERREM) 1 g in sodium chloride 0.9 % 100 mL IVPB  Status:  Discontinued     1 g 200 mL/hr over 30 Minutes Intravenous Every 12 hours 04/02/19 0218 04/02/19 1450   04/02/19 0230  vancomycin (VANCOCIN) 1,500 mg in sodium chloride 0.9 % 500 mL IVPB     1,500 mg 250 mL/hr over 120 Minutes Intravenous  Once 04/02/19 0219 04/02/19 0508   04/02/19 0215  aztreonam (AZACTAM) 2 g in sodium chloride 0.9 % 100 mL IVPB  Status:  Discontinued     2 g 200 mL/hr over 30  Minutes Intravenous  Once 04/02/19 0201 04/02/19 0218   04/02/19 0215  metroNIDAZOLE (FLAGYL) IVPB 500 mg  Status:  Discontinued     500 mg 100 mL/hr over 60 Minutes Intravenous  Once 04/02/19 0201 04/02/19 0218   04/02/19 0215  vancomycin (VANCOCIN) IVPB 1000 mg/200 mL premix  Status:  Discontinued     1,000 mg 200 mL/hr over 60 Minutes Intravenous  Once 04/02/19 0201 04/02/19 0219      Medications   Scheduled Meds: . aspirin EC  81 mg Oral QHS  . calcitRIOL  0.25 mcg Oral Once per day on Mon Wed Fri  . cholecalciferol  5,000 Units Oral Daily  . doxazosin  4 mg Oral QHS  . ferrous sulfate  325 mg Oral BID  . influenza vaccine adjuvanted  0.5 mL Intramuscular Tomorrow-1000  . isosorbide mononitrate  30 mg Oral Daily  . levothyroxine  88 mcg Oral Daily  . omega-3 acid ethyl esters  1 g Oral Daily  . pantoprazole  40 mg Oral BID  . sodium bicarbonate  1,300 mg Oral BID  . tamsulosin  0.4 mg Oral QHS   Continuous Infusions: . meropenem (MERREM) IV Stopped (04/03/19 0320)   PRN Meds:.   Data Review:   Micro Results Recent Results (from the past 240 hour(s))  SARS Coronavirus 2 [facility] order, Performed in [facility] hospital lab) Nasopharyngeal Nasopharyngeal Swab     Status: None   Collection Time: 03/31/19  4:09 AM   Specimen: Nasopharyngeal Swab  Result Value Ref Range Status   SARS Coronavirus 2 NEGATIVE NEGATIVE Final    Comment: (NOTE) If result is NEGATIVE SARS-CoV-2 target nucleic acids are NOT DETECTED. The SARS-CoV-2 RNA is generally detectable in upper and lower  respiratory specimens during the acute phase of infection. The lowest  concentration of SARS-CoV-2 viral copies this assay can detect is 250  copies / mL. A negative result does not preclude SARS-CoV-2 infection  and should not be used as the sole basis for treatment or other  patient management decisions.  A negative result may occur with  improper specimen collection / handling, submission of  specimen other  than nasopharyngeal swab, presence of viral mutation(s) within the  areas targeted by this assay, and inadequate number of viral copies  (<250 copies / mL). A negative result must be combined with clinical  observations, patient history, and epidemiological information. If result is POSITIVE SARS-CoV-2 target nucleic acids are DETECTED. The SARS-CoV-2 RNA is generally detectable in upper and lower  respiratory specimens dur ing the acute phase of infection.  Positive  results are indicative of active infection with SARS-CoV-2.  Clinical  correlation with patient history and other diagnostic information is  necessary to determine patient infection status.  Positive results do  not rule out bacterial infection or co-infection with other viruses. If result is PRESUMPTIVE POSTIVE SARS-CoV-2 nucleic acids MAY BE PRESENT.   A presumptive positive result was obtained on the submitted specimen  and confirmed on repeat testing.  While 2019 novel coronavirus  (SARS-CoV-2) nucleic acids may be present in the submitted sample  additional confirmatory testing may be necessary for epidemiological  and / or clinical management purposes  to differentiate between  SARS-CoV-2 and other Sarbecovirus currently known to infect humans.  If clinically indicated additional testing with an alternate test  methodology [phone]) is advised. The SARS-CoV-2 RNA is generally  detectable in upper and lower respiratory sp ecimens during the acute  phase of infection. The expected result is Negative. Fact Sheet for Patients:  StrictlyIdeas.no Fact Sheet for Healthcare Providers: BankingDealers.co.za This test is not yet approved or cleared by the [place] FDA and has been authorized for detection and/or diagnosis of SARS-CoV-2 by FDA under an Emergency Use Authorization (EUA).  This EUA will remain in effect (meaning this test can be used) for the  duration of the COVID-19 declaration under Section 564(b)(1) of the Act, 21 U.S.C. section 360bbb-3(b)(1), unless the authorization is terminated or revoked sooner. Performed at [facility], [address], [place], [place] [address]   Urine culture     Status: None   Collection Time: 03/31/19 10:34 AM   Specimen: Urine, Random  Result Value Ref Range Status   Specimen Description   Final    URINE, RANDOM Performed at [facility], [address], [place], [place] [address]    Special Requests   Final    NONE Performed at [facility], [address], [place], [place] [address]    Culture   Final    NO GROWTH Performed at [facility], [place] [address], [place], [place] [address]    Report Status 04/01/2019 FINAL  Final  Blood Culture (routine x 2)     Status: Abnormal (Preliminary result)   Collection Time: 04/02/19  1:33 AM   Specimen: BLOOD  Result Value Ref Range Status   Specimen Description   Final    BLOOD LEFT ANTECUBITAL Performed at [facility], [place] [address], [place], [place] [address]    Special Requests   Final    BOTTLES DRAWN AEROBIC AND ANAEROBIC Blood Culture adequate volume Performed at [place] [place], [place] [address]    Culture  Setup Time   Final    GRAM NEGATIVE RODS ANAEROBIC BOTTLE ONLY CRITICAL RESULT CALLED TO, READ BACK BY AND VERIFIED WITH: SHEEMA HALLAJI AT 9381 ON 04/02/19 [facility] Performed at [facility], [place] [address] [place], [place] [address]  Culture ESCHERICHIA COLI (A)  Final   Report Status PENDING  Incomplete  Blood Culture (routine x 2)     Status: None (Preliminary result)   Collection Time: 04/02/19  1:33 AM   Specimen: BLOOD  Result Value Ref Range Status   Specimen Description   Final    BLOOD RIGHT ANTECUBITAL Performed at [facility], [place] [address] [place], [place] [address]    Special Requests   Final    BOTTLES DRAWN AEROBIC  AND ANAEROBIC Blood Culture adequate volume   Culture  Setup Time   Final    GRAM NEGATIVE RODS CRITICAL RESULT CALLED TO, READ BACK BY AND VERIFIED WITH: CRITICAL VALUE NOTED.  VALUE IS CONSISTENT WITH PREVIOUSLY REPORTED AND CALLED VALUE. Performed at [facility], [address] [place], [place] [address]    Culture GRAM NEGATIVE RODS  Final   Report Status PENDING  Incomplete  Urine culture     Status: None   Collection Time: 04/02/19  1:33 AM   Specimen: In/Out Cath Urine  Result Value Ref Range Status   Specimen Description   Final    IN/OUT CATH URINE Performed at [facility], [address], [place], [place] [address]    Special Requests   Final    NONE Performed at [facility], [address], [place], [place] [address]    Culture   Final    NO GROWTH Performed at [facility], [place] [address] [place], [place] [address]    Report Status 04/03/2019 FINAL  Final  Blood Culture ID Panel (Reflexed)     Status: Abnormal   Collection Time: 04/02/19  1:33 AM  Result Value Ref Range Status   Enterococcus species NOT DETECTED NOT DETECTED Final   Listeria monocytogenes NOT DETECTED NOT DETECTED Final   Staphylococcus species NOT DETECTED NOT DETECTED Final   Staphylococcus aureus (BCID) NOT DETECTED NOT DETECTED Final   Streptococcus species NOT DETECTED NOT DETECTED Final   Streptococcus agalactiae NOT DETECTED NOT DETECTED Final   Streptococcus pneumoniae NOT DETECTED NOT DETECTED Final   Streptococcus pyogenes NOT DETECTED NOT DETECTED Final   Acinetobacter baumannii NOT DETECTED NOT DETECTED Final   Enterobacteriaceae species DETECTED (A) NOT DETECTED Final    Comment: Enterobacteriaceae represent a large family of gram-negative bacteria, not a single organism. CRITICAL RESULT CALLED TO, READ BACK BY AND VERIFIED WITH: SHEEMA HALLAJI AT 8413 ON 04/02/2019 [place].    Enterobacter cloacae complex NOT DETECTED NOT DETECTED Final    Escherichia coli DETECTED (A) NOT DETECTED Final    Comment: CRITICAL RESULT CALLED TO, READ BACK BY AND VERIFIED WITH: SHEEMA HALLAJI AT 2440 ON 04/02/19 [place].    Klebsiella oxytoca NOT DETECTED NOT DETECTED Final   Klebsiella pneumoniae NOT DETECTED NOT DETECTED Final   Proteus species NOT DETECTED NOT DETECTED Final   Serratia marcescens NOT DETECTED NOT DETECTED Final   Carbapenem resistance NOT DETECTED NOT DETECTED Final   Haemophilus influenzae NOT DETECTED NOT DETECTED Final   Neisseria meningitidis NOT DETECTED NOT DETECTED Final   Pseudomonas aeruginosa NOT DETECTED NOT DETECTED Final   Candida albicans NOT DETECTED NOT DETECTED Final   Candida glabrata NOT DETECTED NOT DETECTED Final   Candida krusei NOT DETECTED NOT DETECTED Final   Candida parapsilosis NOT DETECTED NOT DETECTED Final   Candida tropicalis NOT DETECTED NOT DETECTED Final    Comment: Performed at [facility], [address] [place],  [address]    Radiology  Reports Ct Abdomen Pelvis Wo Contrast  Result Date: 03/31/2019 CLINICAL DATA:  Acute generalized abdominal pain. EXAM: CT ABDOMEN AND PELVIS WITHOUT CONTRAST TECHNIQUE: Multidetector CT imaging of the abdomen and pelvis was performed following the standard protocol without IV contrast. COMPARISON:  03/08/2018 FINDINGS: Lower chest: Sizable hiatal hernia. Chronic trace pleural fluid at the left base. Coronary calcification. Hepatobiliary: No focal liver abnormality.No evidence of biliary obstruction or stone. Pancreas: Generalized atrophy. Spleen: Unremarkable. Adrenals/Urinary Tract: Negative adrenals. Chronic left hydroureteronephrosis due to distal ureteral calculus with severe left renal atrophy. The left kidney is likely nonfunctional. 15 mm left interpolar calculus. No right hydronephrosis. Thick walled bladder with subtle cellule/diverticulum. Mild perivesicular fat haziness. Stomach/Bowel:  No obstruction. Left colonic diverticulosis.  Vascular/Lymphatic: No acute vascular abnormality. No mass or adenopathy. Haziness of fat in the sigmoid mesentery without mass or adenopathy-consistent with chronic mesenteric panniculitis. Reproductive:No acute finding Other: No ascites or pneumoperitoneum. Musculoskeletal: No acute abnormalities. Osteopenia and spinal degeneration IMPRESSION: 1. Possible cystitis. 2. Finding suggesting chronic bladder outlet obstruction. There is only mild bladder distention currently. 3. 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 4. Large hiatal hernia. Electronically Signed   By: [place] Fantasia M.D.   On: 03/31/2019 06:24   Dg Chest Port 1 View  Result Date: 04/02/2019 CLINICAL DATA:  85 year old male with a history of productive cough EXAM: PORTABLE CHEST 1 VIEW COMPARISON:  April 02, 2019, March 31, 2019 FINDINGS: Cardiomediastinal silhouette unchanged in size and contour. Asymmetric elevation of left hemidiaphragm with interposed gas bubble. Reticular opacities the bilateral lungs, similar to the prior. No new interlobular septal thickening. No large pleural effusion. No pneumothorax. No new confluent airspace disease. Retrocardiac opacity is persisting with blunting of left costophrenic angle. IMPRESSION: Similar appearance of the chest x-ray with chronic lung changes and no evidence of acute cardiopulmonary disease. Electronically Signed   By: Corrie Mckusick D.O.   On: 04/02/2019 15:42   Dg Chest Port 1 View  Result Date: 04/02/2019 CLINICAL DATA:  85 year old male with fever and cough. EXAM: PORTABLE CHEST 1 VIEW COMPARISON:  Chest radiograph dated 03/31/2019 FINDINGS: Left lung base density, likely atelectasis. Infiltrate is not excluded. Clinical correlation is recommended. There is diffuse interstitial prominence similar prior radiograph. No new consolidative changes. No large pleural effusion or pneumothorax. Stable cardiac silhouette. Moderate size hiatal hernia. No acute  osseous pathology. IMPRESSION: No interval change. Electronically Signed   By: Anner Crete M.D.   On: 04/02/2019 02:25   Dg Chest Portable 1 View  Result Date: 03/31/2019 CLINICAL DATA:  Cough and dyspnea EXAM: PORTABLE CHEST 1 VIEW COMPARISON:  03/08/2018 chest x-ray and abdominal CT FINDINGS: Apparent cardiomegaly is primarily related to a large hiatal hernia based on prior. There is also scarring at the left lower lobe. Interstitial crowding at the bases. No edema, effusion, or pneumothorax. Study is limited by leftward rotation. IMPRESSION: 1. Low volume chest with atelectasis at the bases. 2. Large hiatal hernia with overlying scar/atelectasis limiting assessment of the left base. Electronically Signed   By: [place] Fantasia M.D.   On: 03/31/2019 05:06   US Abdomen Limited Ruq  Result Date: 03/31/2019 CLINICAL DATA:  Right upper quadrant pain.  Elevated LFTs. EXAM: ULTRASOUND ABDOMEN LIMITED RIGHT UPPER QUADRANT COMPARISON:  CT 03/31/2019. FINDINGS: Gallbladder: No gallstones or wall thickening visualized. No sonographic Murphy sign noted by sonographer. Common bile duct: Diameter: 5.4 mm Liver: No focal lesion identified. Within normal limits in parenchymal echogenicity. Portal vein is patent on  color Doppler imaging with normal direction of blood flow towards the liver. Other: None. IMPRESSION: No acute abnormality. No gallstones or biliary distention. Liver appears normal. Electronically Signed   By: Marcello Moores  Register   On: 03/31/2019 07:41     CBC Recent Labs  Lab 03/31/19 0408 03/31/19 5009 04/02/19 0133 04/02/19 0516 04/03/19 0306  WBC 4.7  --  2.6* 5.9 7.3  HGB 11.5* 10.4* 9.7* 9.2* 8.8*  HCT 35.6* 31.0* 29.0* 28.0* 26.3*  PLT 103*  --  73* 76* 63*  MCV 93.4  --  92.1 93.0 91.0  MCH 30.2  --  30.8 30.6 30.4  MCHC 32.3  --  33.4 32.9 33.5  RDW 14.4  --  14.6 14.7 14.6  LYMPHSABS 0.2*  --  0.1*  --   --   MONOABS 0.1  --  0.0*  --   --   EOSABS 0.0  --  0.0  --   --    BASOSABS 0.0  --  0.0  --   --     Chemistries  Recent Labs  Lab 03/31/19 0408 04/02/19 0133 04/02/19 0516 04/02/19 1852 04/03/19 0306  NA 133* 132* 135  --  135  K 4.3 3.4* 2.9* 4.9 4.5  CL 100 102 105  --  107  CO2 17* 16* 17*  --  18*  GLUCOSE 113* 88 76  --  96  BUN 43* 52* 47*  --  48*  CREATININE 3.75* 3.69* 3.45*  --  3.39*  CALCIUM 8.0* 7.4* 7.0*  --  7.3*  MG  --   --  1.6*  --  2.3  AST 326* 217* 177*  --  133*  ALT 230* 214* 189*  --  160*  ALKPHOS 242* 244* 228*  --  206*  BILITOT 1.9* 4.0* 4.1*  --  4.0*   ------------------------------------------------------------------------------------------------------------------ estimated creatinine clearance is 13.6 mL/min (A) (by C-G formula based on SCr of 3.39 mg/dL (H)). ------------------------------------------------------------------------------------------------------------------ No results for input(s): HGBA1C in the last 72 hours. ------------------------------------------------------------------------------------------------------------------ No results for input(s): CHOL, HDL, LDLCALC, TRIG, CHOLHDL, LDLDIRECT in the last 72 hours. ------------------------------------------------------------------------------------------------------------------ No results for input(s): TSH, T4TOTAL, T3FREE, THYROIDAB in the last 72 hours.  Invalid input(s): FREET3 ------------------------------------------------------------------------------------------------------------------ No results for input(s): VITAMINB12, FOLATE, FERRITIN, TIBC, IRON, RETICCTPCT in the last 72 hours.  Coagulation profile Recent Labs  Lab 03/31/19 0456 04/02/19 0133  INR 1.0 1.0    No results for input(s): DDIMER in the last 72 hours.  Cardiac Enzymes No results for input(s): CKMB, TROPONINI, MYOGLOBIN in the last 168 hours.  Invalid input(s):  CK ------------------------------------------------------------------------------------------------------------------ Invalid input(s): [place]   85 y.o. male with pertinent past medical history of CAD, chronic diastolic congestive heart failure, hyperlipidemia, hypertension, prostate cancer, CKD stage IV, chronic left hydronephrosis, and chronic elevated LFTs presenting to the ED with chief complaints of fever and abdominal pain.  1. Sepsis  Due to E. coli in his blood--ID to see Continue meropenem for now Source of the E. coli sepsis is unclear urine cultures are negative, ultrasound of the abdomen does not show any evidence of gallbladder disease, CT scan of the abdomen shows no evidence of any infection   2. Chronically elevated LFTs -s - Prior work up including ASMA, AMA, ANA, ceruloplasmin  negative, as well as hepatitis panel -GI has been consulted    3.   Acute chronic kidney disease stage IV -BUN/creatinine at baseline - Hx chronic hydro-ureter noted versus due  to distal ureteral calculus with severe left renal atrophy -Patient has left-sided hydronephrosis chronically type of finding was seen in CT scan in 2018 2019 Patient should follow-up with urology as outpatient    4. Chronic diastolic congestive heart failure -no evidence of exacerbation - Last echo 03/2018 LVEF 60 to 65% - Continue Imdur, - Hold torsemide  5. Coronary Artery Disease  - ASA 81mg  PO daily  6. HLD  + Goal LDL<100 - Atorvastatin 40mg  PO qhs  7. HTN  + Goal BP <130/80 - Continue metoprolol and doxazosin  8. Hypothyroidism -continue Synthroid  9.   Thrombocytopenia history of chronic thrombocytopenia has gotten worse since being hospitalized continue to monitor may need hematology input     Code Status Orders  (From admission, onward)         Start     Ordered   04/02/19 0247  Full code  Continuous     04/02/19 0251        Code Status History     Date Active Date Inactive Code Status Order ID Comments User Context   03/08/2018 1125 03/10/2018 2251 Full Code [id number]  Kathi Ludwig, MD Inpatient   07/20/2016 0614 07/23/2016 1828 Full Code [id number]  Harrie Foreman, MD Inpatient   06/25/2016 0615 06/30/2016 1617 Full Code [id number]  Harrie Foreman, MD Inpatient   07/20/2015 1200 07/23/2015 1548 Full Code [id number]  Epifanio Lesches, MD ED   06/03/2015 1517 06/07/2015 1218 Full Code [id number]  Bettey Costa, MD Inpatient   Advance Care Planning Activity           Consults none  DVT Prophylaxis  Lovenox   Lab Results  Component Value Date   PLT 63 (L) 04/03/2019     Time Spent in minutes   25min  [name] M.D on 04/03/2019 at 12:43 PM  Between 7am to 6pm - Pager - [phone]  After 6pm go to [email or web address] - Proofreader  [employer]   Office  [phone]

## 2019-04-03 NOTE — Consult Note (Signed)
Consultation Note Date: 04/03/2019   Patient Name: [name]  DOB: 11/02/1926  MRN: [id number]  Age / Sex: 85 y.o., male  PCP: [facility], [place] Referring Physician: [name], MD  Reason for Consultation: Establishing goals of care  HPI/Patient Profile: 85 y.o. male with pertinent past medical history of CAD, chronic diastolic congestive heart failure, hyperlipidemia, hypertension, prostate cancer, CKD stage IV, chronic left hydronephrosis, and chronic elevated LFTs presenting to the ED with chief complaints of fever and abdominal pain.  Clinical Assessment and Goals of Care: Patient is sitting in bedside chair. He is married and lives at home with his wife. He is a retired Dealer for [employer].  Functionally, he states he can see lights and shapes, but is mostly blind. He tells me he uses a walker and wheelchair at home. He states he tries to "pick up" and help around the house as he is able. His appetite was good prior to his admission.    We discussed his diagnosis, prognosis, GOC, EOL wishes disposition and options.  A detailed discussion was had today regarding advanced directives.  Concepts specific to code status, artifical feeding and hydration, IV antibiotics and rehospitalization were discussed.  The difference between an aggressive medical intervention path and a comfort care path was discussed.  Values and goals of care important to patient and family were attempted to be elicited.  Discussed limitations of medical interventions to prolong quality of life in some situations and discussed the concept of human mortality.  He states "I want to live like anyone else. I'm old enough to die, but I don't want to until I have to." He would like all care possible including resuscitative efforts.  He states he may change his mind in the future, but this is how he feels at this time. He  states QOL is important, and acceptable QOL means being able to get out of bed, eat what he wants, and be at home.     SUMMARY OF RECOMMENDATIONS   Plans for D/C to SNF. Recommend palliative at D/C.   Prognosis:   Unable to determine  Discharge Planning: [place] for rehab with Palliative care service follow-up      Primary Diagnoses: Present on Admission: . Sepsis ([place])   I have reviewed the medical record, interviewed the patient and family, and examined the patient. The following aspects are pertinent.  Past Medical History:  Diagnosis Date  . Cardiac disease   . CHF (congestive heart failure) ([place])   . Hypercholesteremia   . Hypertension   . Kidney failure    left  . Osteoporosis   . Prostate cancer ([place])   . Shingles   . Skin cancer    right cheek   Social History   Socioeconomic History  . Marital status: Married    Spouse name: Not on file  . Number of children: Not on file  . Years of education: Not on file  . Highest education level: Not on file  Occupational History  .  Not on file  Social Needs  . Financial resource strain: Not on file  . Food insecurity    Worry: Not on file    Inability: Not on file  . Transportation needs    Medical: Not on file    Non-medical: Not on file  Tobacco Use  . Smoking status: Former Smoker    Types: Cigarettes, Cigars  . Smokeless tobacco: Current User    Types: Chew  Substance and Sexual Activity  . Alcohol use: No    Alcohol/week: 0.0 standard drinks  . Drug use: No  . Sexual activity: Not on file  Lifestyle  . Physical activity    Days per week: Not on file    Minutes per session: Not on file  . Stress: Not on file  Relationships  . Social Herbalist on phone: Not on file    Gets together: Not on file    Attends religious service: Not on file    Active member of club or organization: Not on file    Attends meetings of clubs or organizations: Not on file    Relationship  status: Not on file  Other Topics Concern  . Not on file  Social History Narrative  . Not on file   Family History  Problem Relation Age of Onset  . CAD Mother   . Cancer Mother   . Heart attack Father    Scheduled Meds: . aspirin EC  81 mg Oral QHS  . calcitRIOL  0.25 mcg Oral Once per day on Mon Wed Fri  . cholecalciferol  5,000 Units Oral Daily  . doxazosin  4 mg Oral QHS  . feeding supplement (ENSURE ENLIVE)  237 mL Oral BID BM  . ferrous sulfate  325 mg Oral BID  . influenza vaccine adjuvanted  0.5 mL Intramuscular Tomorrow-1000  . isosorbide mononitrate  30 mg Oral Daily  . levothyroxine  88 mcg Oral Daily  . [START ON 04/04/2019] multivitamin with minerals  1 tablet Oral Daily  . omega-3 acid ethyl esters  1 g Oral Daily  . pantoprazole  40 mg Oral BID  . sodium bicarbonate  1,300 mg Oral BID  . tamsulosin  0.4 mg Oral QHS   Continuous Infusions: . meropenem (MERREM) IV Stopped (04/03/19 0320)   PRN Meds:.acetaminophen Medications Prior to Admission:  Prior to Admission medications   Medication Sig Start Date End Date Taking? Authorizing Provider  Acetaminophen (TYLENOL PO) Take 1-2 tablets by mouth daily as needed (pain/headache).   Yes [provider]  aspirin EC 81 MG tablet Take 81 mg by mouth at bedtime.   Yes [provider]  atorvastatin (LIPITOR) 40 MG tablet Take 40 mg by mouth at bedtime.    Yes [provider]  calcitRIOL (ROCALTROL) 0.25 MCG capsule Take 0.25 mcg by mouth See admin instructions. Take one capsule (0.25 mcg) by mouth every Monday, Wednesday, Friday night 02/06/18  Yes [provider]  Cholecalciferol (VITAMIN D3) 5000 units TABS Take 5,000 Units by mouth daily.   Yes [provider]  doxazosin (CARDURA) 4 MG tablet Take 4 mg by mouth at bedtime.    Yes [provider]  ferrous sulfate 325 (65 FE) MG tablet Take 325 mg by mouth 2 (two) times daily.    Yes [provider]  isosorbide  mononitrate (IMDUR) 30 MG 24 hr tablet Take 1 tablet (30 mg total) by mouth daily. 03/11/18  Yes Bloomfield, Carley D, DO  levothyroxine (SYNTHROID,  LEVOTHROID) 88 MCG tablet Take 88 mcg by mouth daily. 02/11/18  Yes [provider]  metoprolol succinate (TOPROL-XL) 50 MG 24 hr tablet Take 50 mg by mouth daily. Take with or immediately following a meal.   Yes [provider]  Omega-3 Fatty Acids (FISH OIL) 1200 MG CAPS Take 1,200 mg by mouth 2 (two) times daily.   Yes [provider]  pantoprazole (PROTONIX) 40 MG tablet Take 1 tablet (40 mg total) by mouth 2 (two) times daily. 03/10/18  Yes Bloomfield, Carley D, DO  potassium chloride SA (K-DUR,KLOR-CON) 20 MEQ tablet Take 20 mEq by mouth 2 (two) times daily.  08/18/15  Yes [provider]  sodium bicarbonate 650 MG tablet Take 1,300 mg by mouth 2 (two) times daily.    Yes [provider]  tamsulosin (FLOMAX) 0.4 MG CAPS capsule Take 0.4 mg by mouth at bedtime.    Yes [provider]  torsemide (DEMADEX) 20 MG tablet Take 40 mg by mouth daily.  10/27/15  Yes [provider]   Allergies  Allergen Reactions  . Ativan [Lorazepam] Other (See Comments)    Hallucinations, combative  . Penicillin G Other (See Comments)    Has patient had a PCN reaction causing immediate rash, facial/tongue/throat swelling, SOB or lightheadedness with hypotension: Yes Has patient had a PCN reaction causing severe rash involving mucus membranes or skin necrosis: No Has patient had a PCN reaction that required hospitalization No Has patient had a PCN reaction occurring within the last 10 years: No If all of the above answers are "NO", then may proceed with Cephalosporin use.    Review of Systems  Constitutional: Positive for activity change.  Eyes: Positive for visual disturbance.    Physical Exam Pulmonary:     Effort: Pulmonary effort is normal.  Neurological:     Mental Status: He is alert.     Vital  Signs: BP (!) 111/56 (BP Location: Left Arm)   Pulse 61   Temp 98.2 F (36.8 C) (Oral)   Resp 20   Ht 5\' 8"  (1.727 m)   Wt 65.3 kg   SpO2 95%   BMI 21.89 kg/m  Pain Scale: 0-10   Pain Score: 5    SpO2: SpO2: 95 % O2 Device:SpO2: 95 % O2 Flow Rate: .   IO: Intake/output summary:   Intake/Output Summary (Last 24 hours) at 04/03/2019 1403 Last data filed at 04/03/2019 0919 Gross per 24 hour  Intake 100 ml  Output 1000 ml  Net -900 ml    LBM: Last BM Date: 04/02/19 Baseline Weight: Weight: 61.2 kg Most recent weight: Weight: 65.3 kg     Palliative Assessment/Data: 40%     Time In: 1:35 Time Out: 2:10 Time Total: 35 min Greater than 50%  of this time was spent counseling and coordinating care related to the above assessment and plan.  Signed by: Asencion Gowda, NP   Please contact Palliative Medicine Team phone at [phone] for questions and concerns.  For individual provider: See Shea Evans

## 2019-04-03 NOTE — Consult Note (Signed)
NAME: [name]  DOB: 1927-01-03  MRN: [id number]  Date/Time: 04/03/2019 4:41 PM  REQUESTING PROVIDER: patel Subjective:  REASON FOR CONSULT: source of e.coli bacteremia ?pt not a reliable historian- chart reviewed in detail [name] is a 85 y.o. male  with a history of prostate ca, CKD, CHF, HTN , chronic left hydronephrosis admitted from home with fever and abdominal pain.  Pt has a complicated gall bladder history as follows 07/20/16 Acute cholecystitis and pericholecystic abscess,gall stones underwent perc drainage by IR and a cholecystostomy drainage 10/29/16 infection of the drain - enterococcus and e.coli 10/30/16 drain study shows a patent cystic duct and common duct Drain removed 11/22/16 Drainage of pus from Rt upper quadrant prompting to come to ED in July and aug 2018 needing antibiotics  03/09/18 admitted with weakness and confusion - labs revealed elevated transminases, alkphop,  had MRCP without contrast and no obvious lesion in the biliary system was identified  03/31/19 presented to ED with b/l upper quadrant  Pain and dark stool. Did not want to stay in the hospital -wanted to follow up with GI as OP  Pt says he has back pain and upper abdominal pain Has frequent urination at night  Past Medical History:  Diagnosis Date   Cardiac disease    CHF (congestive heart failure) ([place])    Hypercholesteremia    Hypertension    Kidney failure    left   Osteoporosis    Prostate cancer ([place])    Shingles    Skin cancer    right cheek    Past Surgical History:  Procedure Laterality Date   CARDIAC CATHETERIZATION  04/13/2014   CT PERC CHOLECYSTOSTOMY  07/20/2016   Placed at [facility] Interventional Radiology    Social History   Socioeconomic History   Marital status: Married    Spouse name: Not on file   Number of children: Not on file   Years of education: Not on file   Highest education level: Not on file  Occupational History   Not on file  Social  Needs   Financial resource strain: Not on file   Food insecurity    Worry: Not on file    Inability: Not on file   Transportation needs    Medical: Not on file    Non-medical: Not on file  Tobacco Use   Smoking status: Former Smoker    Types: Cigarettes, Cigars   Smokeless tobacco: Current User    Types: Chew  Substance and Sexual Activity   Alcohol use: No    Alcohol/week: 0.0 standard drinks   Drug use: No   Sexual activity: Not on file  Lifestyle   Physical activity    Days per week: Not on file    Minutes per session: Not on file   Stress: Not on file  Relationships   Social connections    Talks on phone: Not on file    Gets together: Not on file    Attends religious service: Not on file    Active member of club or organization: Not on file    Attends meetings of clubs or organizations: Not on file    Relationship status: Not on file   Intimate partner violence    Fear of current or ex partner: Not on file    Emotionally abused: Not on file    Physically abused: Not on file    Forced sexual activity: Not on file  Other Topics Concern   Not on file  Social History Narrative   Not on file    Family History  Problem Relation Age of Onset   CAD Mother    Cancer Mother    Heart attack Father    Allergies  Allergen Reactions   Ativan [Lorazepam] Other (See Comments)    Hallucinations, combative   Penicillin G Other (See Comments)    Has patient had a PCN reaction causing immediate rash, facial/tongue/throat swelling, SOB or lightheadedness with hypotension: Yes Has patient had a PCN reaction causing severe rash involving mucus membranes or skin necrosis: No Has patient had a PCN reaction that required hospitalization No Has patient had a PCN reaction occurring within the last 10 years: No If all of the above answers are "NO", then may proceed with Cephalosporin use.    ? Current Facility-Administered Medications  Medication Dose Route  Frequency Provider Last Rate Last Dose   acetaminophen (TYLENOL) tablet 650 mg  650 mg Oral Q6H PRN [name], MD   650 mg at 04/03/19 8588   aspirin EC tablet 81 mg  81 mg Oral QHS [name], MD   81 mg at 04/02/19 2117   calcitRIOL (ROCALTROL) capsule 0.25 mcg  0.25 mcg Oral Once per day on Mon Wed Fri Patel, Shreyang, MD       cholecalciferol (VITAMIN D3) tablet 5,000 Units  5,000 Units Oral Daily [name], MD   5,000 Units at 04/03/19 0844   doxazosin (CARDURA) tablet 4 mg  4 mg Oral QHS [name], MD   4 mg at 04/02/19 2117   feeding supplement (ENSURE ENLIVE) (ENSURE ENLIVE) liquid 237 mL  237 mL Oral BID BM [name], MD       ferrous sulfate tablet 325 mg  325 mg Oral BID [name], MD   325 mg at 04/03/19 0845   influenza vaccine adjuvanted (FLUAD) injection 0.5 mL  0.5 mL Intramuscular Tomorrow-1000 Lang Snow, NP       isosorbide mononitrate (IMDUR) 24 hr tablet 30 mg  30 mg Oral Daily [name], MD   30 mg at 04/03/19 0844   levothyroxine (SYNTHROID) tablet 88 mcg  88 mcg Oral Daily [name], MD   88 mcg at 04/03/19 0845   meropenem (MERREM) 500 mg in sodium chloride 0.9 % 100 mL IVPB  500 mg Intravenous Q12H Berton Mount, RPH   Stopped at 04/03/19 0320   [START ON 04/04/2019] multivitamin with minerals tablet 1 tablet  1 tablet Oral Daily [name], MD       omega-3 acid ethyl esters (LOVAZA) capsule 1 g  1 g Oral Daily [name], MD   1 g at 04/03/19 0844   pantoprazole (PROTONIX) EC tablet 40 mg  40 mg Oral BID [name], MD   40 mg at 04/03/19 0845   sodium bicarbonate tablet 1,300 mg  1,300 mg Oral BID [name], MD   1,300 mg at 04/03/19 0844   tamsulosin (FLOMAX) capsule 0.4 mg  0.4 mg Oral QHS [name], MD   0.4 mg at 04/02/19 2118     Abtx:  Anti-infectives (From admission, onward)   Start     Dose/Rate Route Frequency Ordered Stop   04/03/19 1500  vancomycin  (VANCOCIN) 500 mg in sodium chloride 0.9 % 100 mL IVPB  Status:  Discontinued     500 mg 100 mL/hr over 60 Minutes Intravenous Every 36 hours 04/02/19 0528 04/02/19 1215   04/03/19 0200  meropenem (MERREM) 500 mg in sodium chloride  0.9 % 100 mL IVPB     500 mg 200 mL/hr over 30 Minutes Intravenous Every 12 hours 04/02/19 1450     04/02/19 0230  meropenem (MERREM) 1 g in sodium chloride 0.9 % 100 mL IVPB  Status:  Discontinued     1 g 200 mL/hr over 30 Minutes Intravenous Every 12 hours 04/02/19 0218 04/02/19 1450   04/02/19 0230  vancomycin (VANCOCIN) 1,500 mg in sodium chloride 0.9 % 500 mL IVPB     1,500 mg 250 mL/hr over 120 Minutes Intravenous  Once 04/02/19 0219 04/02/19 0508   04/02/19 0215  aztreonam (AZACTAM) 2 g in sodium chloride 0.9 % 100 mL IVPB  Status:  Discontinued     2 g 200 mL/hr over 30 Minutes Intravenous  Once 04/02/19 0201 04/02/19 0218   04/02/19 0215  metroNIDAZOLE (FLAGYL) IVPB 500 mg  Status:  Discontinued     500 mg 100 mL/hr over 60 Minutes Intravenous  Once 04/02/19 0201 04/02/19 0218   04/02/19 0215  vancomycin (VANCOCIN) IVPB 1000 mg/200 mL premix  Status:  Discontinued     1,000 mg 200 mL/hr over 60 Minutes Intravenous  Once 04/02/19 0201 04/02/19 0219      REVIEW OF SYSTEMS:  Const:  fever, chills, negative weight loss Eyes: negative diplopia or visual changes, negative eye pain ENT: negative coryza, negative sore throat Resp: negative cough, hemoptysis, dyspnea Cards: negative for chest pain, palpitations, lower extremity edema GU: hasr frequency, no dysuria and hematuria GI: positive for abdominal pain, no diarrhea, had passed black stools  Skin: negative for rash and pruritus Heme: negative for easy bruising and gum/nose bleeding MS: generalized weakness Neurolo:negative for headaches, dizziness, vertigo, memory problems  Psych: negative for feelings of anxiety, depression  Endocrine: no polyuria Allergy/Immunology-PCN/ativan? : Objective:    VITALS:  BP 110/66 (BP Location: Left Arm)    Pulse 66    Temp 98.1 F (36.7 C) (Oral)    Resp 20    Ht 5\' 8"  (1.727 m)    Wt 65.3 kg    SpO2 96%    BMI 21.89 kg/m  PHYSICAL EXAM:  General: Alert, cooperative, no distress, appears stated age.  Head: Normocephalic, without obvious abnormality, atraumatic. Eyes: Conjunctivae clear, anicteric sclerae. Pupils are equal ENT Nares normal. No drainage or sinus tenderness. Lips, mucosa, and tongue normal. No Thrush Neck: Supple, symmetrical, no adenopathy, thyroid: non tender no carotid bruit and no JVD. Back: No CVA tenderness. Lungs: Clear to auscultation bilaterally. No Wheezing or Rhonchi. No rales. Heart: Regular rate and rhythm, no murmur, rub or gallop. Abdomen: Soft, non-tender,not distended. Bowel sounds normal. Soft fatty structure felt rt lower quadrant Extremities: atraumatic, no cyanosis. No edema. No clubbing Skin: No rashes or lesions. Or bruising Lymph: Cervical, supraclavicular normal. Neurologic: Grossly non-focal Pertinent Labs Lab Results CBC    Component Value Date/Time   WBC 7.3 04/03/2019 0306   RBC 2.89 (L) 04/03/2019 0306   HGB 8.8 (L) 04/03/2019 0306   HGB 15.1 03/05/2012 1142   HCT 26.3 (L) 04/03/2019 0306   HCT 43.9 03/05/2012 1142   PLT 63 (L) 04/03/2019 0306   PLT 211 03/05/2012 1142   MCV 91.0 04/03/2019 0306   MCV 84 03/05/2012 1142   MCH 30.4 04/03/2019 0306   MCHC 33.5 04/03/2019 0306   RDW 14.6 04/03/2019 0306   RDW 15.4 (H) 03/05/2012 1142   LYMPHSABS 0.1 (L) 04/02/2019 0133   LYMPHSABS 0.9 (L) 03/05/2012 1142   MONOABS 0.0 (L) 04/02/2019 0133  MONOABS 0.6 03/05/2012 1142   EOSABS 0.0 04/02/2019 0133   EOSABS 0.1 03/05/2012 1142   BASOSABS 0.0 04/02/2019 0133   BASOSABS 0.0 03/05/2012 1142    Alkaline phosphotase  bilirubin        CMP Latest Ref Rng & Units 04/03/2019 04/02/2019 04/02/2019  Glucose 70 - 99 mg/dL 96 - 76  BUN 8 - 23 mg/dL 48(H) - 47(H)  Creatinine 0.61 - 1.24  mg/dL 3.39(H) - 3.45(H)  Sodium 135 - 145 mmol/L 135 - 135  Potassium 3.5 - 5.1 mmol/L 4.5 4.9 2.9(L)  Chloride 98 - 111 mmol/L 107 - 105  CO2 22 - 32 mmol/L 18(L) - 17(L)  Calcium 8.9 - 10.3 mg/dL 7.3(L) - 7.0(L)  Total Protein 6.5 - 8.1 g/dL 4.5(L) - 4.5(L)  Total Bilirubin 0.3 - 1.2 mg/dL 4.0(H) - 4.1(H)  Alkaline Phos 38 - 126 U/L 206(H) - 228(H)  AST 15 - 41 U/L 133(H) - 177(H)  ALT 0 - 44 U/L 160(H) - 189(H)      Microbiology: Recent Results (from the past 240 hour(s))  SARS Coronavirus 2 [facility] order, Performed in [facility] hospital lab) Nasopharyngeal Nasopharyngeal Swab     Status: None   Collection Time: 03/31/19  4:09 AM   Specimen: Nasopharyngeal Swab  Result Value Ref Range Status   SARS Coronavirus 2 NEGATIVE NEGATIVE Final    Comment: (NOTE) If result is NEGATIVE SARS-CoV-2 target nucleic acids are NOT DETECTED. The SARS-CoV-2 RNA is generally detectable in upper and lower  respiratory specimens during the acute phase of infection. The lowest  concentration of SARS-CoV-2 viral copies this assay can detect is 250  copies / mL. A negative result does not preclude SARS-CoV-2 infection  and should not be used as the sole basis for treatment or other  patient management decisions.  A negative result may occur with  improper specimen collection / handling, submission of specimen other  than nasopharyngeal swab, presence of viral mutation(s) within the  areas targeted by this assay, and inadequate number of viral copies  (<250 copies / mL). A negative result must be combined with clinical  observations, patient history, and epidemiological information. If result is POSITIVE SARS-CoV-2 target nucleic acids are DETECTED. The SARS-CoV-2 RNA is generally detectable in upper and lower  respiratory specimens dur ing the acute phase of infection.  Positive  results are indicative of active infection with SARS-CoV-2.  Clinical  correlation with patient history and other  diagnostic information is  necessary to determine patient infection status.  Positive results do  not rule out bacterial infection or co-infection with other viruses. If result is PRESUMPTIVE POSTIVE SARS-CoV-2 nucleic acids MAY BE PRESENT.   A presumptive positive result was obtained on the submitted specimen  and confirmed on repeat testing.  While 2019 novel coronavirus  (SARS-CoV-2) nucleic acids may be present in the submitted sample  additional confirmatory testing may be necessary for epidemiological  and / or clinical management purposes  to differentiate between  SARS-CoV-2 and other Sarbecovirus currently known to infect humans.  If clinically indicated additional testing with an alternate test  methodology [phone]) is advised. The SARS-CoV-2 RNA is generally  detectable in upper and lower respiratory sp ecimens during the acute  phase of infection. The expected result is Negative. Fact Sheet for Patients:  StrictlyIdeas.no Fact Sheet for Healthcare Providers: BankingDealers.co.za This test is not yet approved or cleared by the [place] FDA and has been authorized for detection and/or diagnosis of SARS-CoV-2 by FDA  under an Emergency Use Authorization (EUA).  This EUA will remain in effect (meaning this test can be used) for the duration of the COVID-19 declaration under Section 564(b)(1) of the Act, 21 U.S.C. section 360bbb-3(b)(1), unless the authorization is terminated or revoked sooner. Performed at [facility], [address], [place], [place] [address]   Urine culture     Status: None   Collection Time: 03/31/19 10:34 AM   Specimen: Urine, Random  Result Value Ref Range Status   Specimen Description   Final    URINE, RANDOM Performed at [facility], [address] [place], [place] [address]    Special Requests   Final    NONE Performed at [facility], [address]  [place], [place] [address]    Culture   Final    NO GROWTH Performed at [facility], [place] [address] [place], [place] [address]    Report Status 04/01/2019 FINAL  Final  Blood Culture (routine x 2)     Status: Abnormal (Preliminary result)   Collection Time: 04/02/19  1:33 AM   Specimen: BLOOD  Result Value Ref Range Status   Specimen Description   Final    BLOOD LEFT ANTECUBITAL Performed at [facility], [place] [address] [place], [place] [address]    Special Requests   Final    BOTTLES DRAWN AEROBIC AND ANAEROBIC Blood Culture adequate volume Performed at [place] [place], [place] [address]    Culture  Setup Time   Final    GRAM NEGATIVE RODS ANAEROBIC BOTTLE ONLY CRITICAL RESULT CALLED TO, READ BACK BY AND VERIFIED WITH: SHEEMA HALLAJI AT 0109 ON 04/02/19 [facility] Performed at [facility], [place] [address] [place], [place] [address]    Culture ESCHERICHIA COLI (A)  Final   Report Status PENDING  Incomplete  Blood Culture (routine x 2)     Status: None (Preliminary result)   Collection Time: 04/02/19  1:33 AM   Specimen: BLOOD  Result Value Ref Range Status   Specimen Description   Final    BLOOD RIGHT ANTECUBITAL Performed at [facility], [place] [address], [place], [place] [address]    Special Requests   Final    BOTTLES DRAWN AEROBIC AND ANAEROBIC Blood Culture adequate volume   Culture  Setup Time   Final    GRAM NEGATIVE RODS CRITICAL RESULT CALLED TO, READ BACK BY AND VERIFIED WITH: CRITICAL VALUE NOTED.  VALUE IS CONSISTENT WITH PREVIOUSLY REPORTED AND CALLED VALUE. Performed at [facility], [address] [place], [place] [address]    Culture GRAM NEGATIVE RODS  Final   Report Status PENDING  Incomplete  Urine culture     Status: None   Collection Time: 04/02/19  1:33 AM   Specimen: In/Out Cath Urine  Result Value Ref Range Status   Specimen Description   Final    IN/OUT CATH URINE Performed at [facility], [address] [place], [place] [address]    Special Requests   Final    NONE Performed at [facility], [address] [place], [place] [address]    Culture   Final    NO GROWTH Performed at [facility], [place] [address] [place],  [address]    Report Status 04/03/2019 FINAL  Final  Blood Culture ID Panel (Reflexed)     Status: Abnormal   Collection Time: 04/02/19  1:33 AM  Result Value Ref Range Status   Enterococcus species NOT DETECTED NOT DETECTED Final   Listeria monocytogenes NOT DETECTED NOT DETECTED Final   Staphylococcus species NOT DETECTED NOT DETECTED Final   Staphylococcus aureus (BCID) NOT DETECTED NOT DETECTED Final   Streptococcus species NOT DETECTED NOT DETECTED Final   Streptococcus agalactiae NOT DETECTED NOT DETECTED Final   Streptococcus pneumoniae NOT DETECTED NOT DETECTED Final   Streptococcus pyogenes NOT DETECTED NOT DETECTED Final   Acinetobacter baumannii NOT DETECTED NOT DETECTED Final   Enterobacteriaceae species DETECTED (A) NOT DETECTED Final    Comment: Enterobacteriaceae represent a large family of gram-negative bacteria, not a single organism. CRITICAL RESULT CALLED TO, READ BACK BY AND VERIFIED WITH: SHEEMA HALLAJI AT 9244 ON 04/02/2019 [place].    Enterobacter cloacae complex NOT DETECTED NOT DETECTED Final   Escherichia coli DETECTED (A) NOT DETECTED Final    Comment: CRITICAL RESULT CALLED TO, READ BACK BY AND VERIFIED WITH: SHEEMA HALLAJI AT 6286 ON 04/02/19 [place].    Klebsiella oxytoca NOT DETECTED NOT DETECTED Final   Klebsiella pneumoniae NOT DETECTED NOT DETECTED Final   Proteus species NOT DETECTED NOT DETECTED Final   Serratia marcescens NOT DETECTED NOT DETECTED Final   Carbapenem resistance NOT DETECTED NOT DETECTED Final   Haemophilus influenzae NOT DETECTED NOT DETECTED Final   Neisseria meningitidis NOT DETECTED NOT DETECTED Final   Pseudomonas aeruginosa NOT DETECTED NOT DETECTED Final    Candida albicans NOT DETECTED NOT DETECTED Final   Candida glabrata NOT DETECTED NOT DETECTED Final   Candida krusei NOT DETECTED NOT DETECTED Final   Candida parapsilosis NOT DETECTED NOT DETECTED Final   Candida tropicalis NOT DETECTED NOT DETECTED Final    Comment: Performed at [facility], [address] [place],  [address]    IMAGING RESULTS: Hepatobiliary: No focal liver abnormality.No evidence of biliary obstruction or stone Chronic left hydroureteronephrosis due to distal ureteral calculus with severe left renal atrophy. The left kidney is likely nonfunctional. 15 mm left interpolar calculus. No right hydronephrosis. Thick walled bladder with subtle cellule/diverticulum. Mild perivesicular fat haziness. I have personally reviewed the films ? Impression/Recommendation ? ?E.coli bacteremia- 2 possible sources- urinary tract especially  with left hydronephrosis and calculi and Bladder outlet obstruction Vs biliary source- CT scan without contrast does not show biliary stricture, dilatation, stone but patient has a complicated gall bladder hsitory with cholecystitis managed in the past by percutaneous drainage . Has persistent abnormal transaminitis and increased alkaline phosphatase since 2019 May need to get GI on board ? Endoscopy ? ERCp- (  ?also recommend bladder scan post void to look for residue  Ca prostate   CKD  Abnormal LFTS Anemia and thrombocytopenia ( also had leucopenia) check b12 and folate  Pt currently on meropenem - deescalate once susceptibility is available ___________________________________________________ Discussed with his nurse ID will follow peripherally this weekend - call if needed Note:  This document was prepared using Dragon voice recognition software and may include unintentional dictation errors.

## 2019-04-03 NOTE — Consult Note (Signed)
Kirk Darby, MD [address]  [place]  [place], [place] [address]  Main: [phone]  Fax: [phone] Pager: [phone]   Consultation  Referring Provider:     No ref. provider found Primary Care Physician:  Care, [place] Primary Gastroenterologist: Althia Forts     Reason for Consultation:     Elevated LFTs  Date of Admission:  04/02/2019 Date of Consultation:  04/03/2019         HPI:   [name] is a 85 y.o. male with CAD, chronic diastolic congestive heart failure, hyperlipidemia, hypertension, prostate cancer, CKD stage IV, chronic left hydronephrosis, and chronic elevated LFTs presenting to the ED with chief complaints of fever and abdominal pain.  Patient had history of complicated acute cholecystitis with abscess that required percutaneous drainage by IR complicated by infection of the drain with enterococcus and E. coli treated with antibiotics, and drain was removed during the timeframe 07/20/2016 to 02/2017.  Patient was found to have temp of 102 on admission on 9/24 as well as elevated LFTs alkaline phosphatase 244, AST 217, ALT 214, T bili 4, WBC count 2.6.  He has been experiencing intermittent episodes of abdominal pain without nausea or vomiting, diarrhea or constipation, he denies hematemesis, melena or rectal bleeding.  He was seen in the ER on 9/22 for abdominal pain and admitted for a day.  At that time, he was found to have elevated LFTs alkaline phosphatase 242, AST 326, ALT 230, T bili 1.9.  He has chronically elevated LFTs since 03/2018 when he was admitted to [facility] 8/31 to 9/2.  His LFTs were noted to be normal until 10/2016.  At that time, he underwent MRCP which was negative for biliary obstruction, secondary liver disease work-up was negative.  It was thought to be secondary to ischemic liver injury or he may have passed a stone.  It appears that patient is dependent on ADLs and mostly sedentary, however he is able to answer questions  appropriately.  He acknowledges that he was drinking alcohol heavily for several years.  He does have chronic normocytic anemia, thrombocytopenia and hypoalbuminemia dating back to 2012 Blood cultures during this admission returned positive for E. coli and Enterobacteriaceae.  ID is consulted   NSAIDs: None  Antiplts/Anticoagulants/Anti thrombotics: None  GI Procedures: None  Past Medical History:  Diagnosis Date   Cardiac disease    CHF (congestive heart failure) ([place])    Hypercholesteremia    Hypertension    Kidney failure    left   Osteoporosis    Prostate cancer ([place])    Shingles    Skin cancer    right cheek    Past Surgical History:  Procedure Laterality Date   CARDIAC CATHETERIZATION  04/13/2014   CT PERC CHOLECYSTOSTOMY  07/20/2016   Placed at [facility] Interventional Radiology    Prior to Admission medications   Medication Sig Start Date End Date Taking? Authorizing Provider  Acetaminophen (TYLENOL PO) Take 1-2 tablets by mouth daily as needed (pain/headache).   Yes [provider]  aspirin EC 81 MG tablet Take 81 mg by mouth at bedtime.   Yes [provider]  atorvastatin (LIPITOR) 40 MG tablet Take 40 mg by mouth at bedtime.    Yes [provider]  calcitRIOL (ROCALTROL) 0.25 MCG capsule Take 0.25 mcg by mouth See admin instructions. Take one capsule (0.25 mcg) by mouth every Monday, Wednesday, Friday night 02/06/18  Yes [provider]  Cholecalciferol (VITAMIN D3)  5000 units TABS Take 5,000 Units by mouth daily.   Yes [provider]  doxazosin (CARDURA) 4 MG tablet Take 4 mg by mouth at bedtime.    Yes [provider]  ferrous sulfate 325 (65 FE) MG tablet Take 325 mg by mouth 2 (two) times daily.    Yes [provider]  isosorbide mononitrate (IMDUR) 30 MG 24 hr tablet Take 1 tablet (30 mg total) by mouth daily. 03/11/18  Yes [place], Carley D, DO  levothyroxine (SYNTHROID, LEVOTHROID) 88 MCG  tablet Take 88 mcg by mouth daily. 02/11/18  Yes [provider]  metoprolol succinate (TOPROL-XL) 50 MG 24 hr tablet Take 50 mg by mouth daily. Take with or immediately following a meal.   Yes [provider]  Omega-3 Fatty Acids (FISH OIL) 1200 MG CAPS Take 1,200 mg by mouth 2 (two) times daily.   Yes [provider]  pantoprazole (PROTONIX) 40 MG tablet Take 1 tablet (40 mg total) by mouth 2 (two) times daily. 03/10/18  Yes [place], Carley D, DO  potassium chloride SA (K-DUR,KLOR-CON) 20 MEQ tablet Take 20 mEq by mouth 2 (two) times daily.  08/18/15  Yes [provider]  sodium bicarbonate 650 MG tablet Take 1,300 mg by mouth 2 (two) times daily.    Yes [provider]  tamsulosin (FLOMAX) 0.4 MG CAPS capsule Take 0.4 mg by mouth at bedtime.    Yes [provider]  torsemide (DEMADEX) 20 MG tablet Take 40 mg by mouth daily.  10/27/15  Yes [provider]    Current Facility-Administered Medications:    acetaminophen (TYLENOL) tablet 650 mg, 650 mg, Oral, Q6H PRN, [name], MD, 650 mg at 04/03/19 8786   aspirin EC tablet 81 mg, 81 mg, Oral, QHS, [name], MD, 81 mg at 04/02/19 2117   calcitRIOL (ROCALTROL) capsule 0.25 mcg, 0.25 mcg, Oral, Once per day on Mon Wed Fri, Patel, Chana Bode, MD, 0.25 mcg at 04/03/19 1740   cholecalciferol (VITAMIN D3) tablet 5,000 Units, 5,000 Units, Oral, Daily, [name], MD, 5,000 Units at 04/03/19 0844   doxazosin (CARDURA) tablet 4 mg, 4 mg, Oral, QHS, [name], MD, 4 mg at 04/02/19 2117   feeding supplement (ENSURE ENLIVE) (ENSURE ENLIVE) liquid 237 mL, 237 mL, Oral, BID BM, [name], MD, 237 mL at 04/03/19 1739   ferrous sulfate tablet 325 mg, 325 mg, Oral, BID, [name], MD, 325 mg at 04/03/19 0845   influenza vaccine adjuvanted (FLUAD) injection 0.5 mL, 0.5 mL, Intramuscular, Tomorrow-1000, [place], Bing Neighbors, NP   isosorbide mononitrate (IMDUR) 24  hr tablet 30 mg, 30 mg, Oral, Daily, [name], MD, 30 mg at 04/03/19 0844   levothyroxine (SYNTHROID) tablet 88 mcg, 88 mcg, Oral, Daily, [name], MD, 88 mcg at 04/03/19 0845   meropenem (MERREM) 500 mg in sodium chloride 0.9 % 100 mL IVPB, 500 mg, Intravenous, Q12H, Berton Mount, RPH, Last Rate: 200 mL/hr at 04/03/19 1703   [START ON 04/04/2019] multivitamin with minerals tablet 1 tablet, 1 tablet, Oral, Daily, [name], MD   omega-3 acid ethyl esters (LOVAZA) capsule 1 g, 1 g, Oral, Daily, [name], MD, 1 g at 04/03/19 0844   pantoprazole (PROTONIX) EC tablet 40 mg, 40 mg, Oral, BID, [name], MD, 40 mg at 04/03/19 0845   sodium bicarbonate tablet 1,300 mg, 1,300 mg, Oral, BID, [name], MD, 1,300 mg at 04/03/19 0844   tamsulosin (FLOMAX) capsule 0.4 mg, 0.4 mg, Oral, QHS, Patel, [place],  MD, 0.4 mg at 04/02/19 2118  Family History  Problem Relation Age of Onset   CAD Mother    Cancer Mother    Heart attack Father      Social History   Tobacco Use   Smoking status: Former Smoker    Types: Cigarettes, Cigars   Smokeless tobacco: Current User    Types: Chew  Substance Use Topics   Alcohol use: No    Alcohol/week: 0.0 standard drinks   Drug use: No    Allergies as of 04/02/2019 - Review Complete 04/02/2019  Allergen Reaction Noted   Ativan [lorazepam] Other (See Comments) 06/05/2015   Penicillin g Other (See Comments) 10/25/2014    Review of Systems:    All systems reviewed and negative except where noted in HPI.   Physical Exam:  Vital signs in last 24 hours: Temp:  [97.8 F (36.6 C)-98.2 F (36.8 C)] 98.1 F (36.7 C) (09/25 1540) Pulse Rate:  [61-111] 66 (09/25 1540) Resp:  [18-20] 20 (09/25 1540) BP: (98-111)/(56-66) 110/66 (09/25 1540) SpO2:  [95 %-99 %] 96 % (09/25 1540) Last BM Date: 04/02/19 General:   Pleasant, cooperative in NAD, thin built Head:  Normocephalic and atraumatic. Eyes: Positive  scleral icterus.   Conjunctiva pink. PERRLA. Ears:  Normal auditory acuity. Neck:  Supple; no masses or thyroidomegaly Lungs: Respirations even and unlabored. Lungs clear to auscultation bilaterally.   No wheezes, crackles, or rhonchi.  Heart:  Regular rate and rhythm;  Without murmur, clicks, rubs or gallops Abdomen:  Soft, nondistended, nontender. Normal bowel sounds. No appreciable masses or hepatomegaly.  No rebound or guarding.  Rectal:  Not performed. Msk:  Symmetrical without gross deformities.   Extremities:  Without edema, cyanosis or clubbing. Neurologic:  Alert and oriented x3;  grossly normal neurologically. Skin:  Intact without significant lesions or rashes, yellowing of skin. Psych:  Alert and cooperative. Normal affect.  LAB RESULTS: CBC Latest Ref Rng & Units 04/03/2019 04/02/2019 04/02/2019  WBC 4.0 - 10.5 K/uL 7.3 5.9 2.6(L)  Hemoglobin 13.0 - 17.0 g/dL 8.8(L) 9.2(L) 9.7(L)  Hematocrit 39.0 - 52.0 % 26.3(L) 28.0(L) 29.0(L)  Platelets 150 - 400 K/uL 63(L) 76(L) 73(L)    BMET BMP Latest Ref Rng & Units 04/03/2019 04/02/2019 04/02/2019  Glucose 70 - 99 mg/dL 96 - 76  BUN 8 - 23 mg/dL 48(H) - 47(H)  Creatinine 0.61 - 1.24 mg/dL 3.39(H) - 3.45(H)  Sodium 135 - 145 mmol/L 135 - 135  Potassium 3.5 - 5.1 mmol/L 4.5 4.9 2.9(L)  Chloride 98 - 111 mmol/L 107 - 105  CO2 22 - 32 mmol/L 18(L) - 17(L)  Calcium 8.9 - 10.3 mg/dL 7.3(L) - 7.0(L)    LFT Hepatic Function Latest Ref Rng & Units 04/03/2019 04/02/2019 04/02/2019  Total Protein 6.5 - 8.1 g/dL 4.5(L) 4.5(L) 5.0(L)  Albumin 3.5 - 5.0 g/dL 2.0(L) 2.2(L) 2.3(L)  AST 15 - 41 U/L 133(H) 177(H) 217(H)  ALT 0 - 44 U/L 160(H) 189(H) 214(H)  Alk Phosphatase 38 - 126 U/L 206(H) 228(H) 244(H)  Total Bilirubin 0.3 - 1.2 mg/dL 4.0(H) 4.1(H) 4.0(H)  Bilirubin, Direct 0.0 - 0.2 mg/dL - - -     STUDIES: Dg Chest Port 1 View  Result Date: 04/02/2019 CLINICAL DATA:  85 year old male with a history of productive cough EXAM: PORTABLE  CHEST 1 VIEW COMPARISON:  April 02, 2019, March 31, 2019 FINDINGS: Cardiomediastinal silhouette unchanged in size and contour. Asymmetric elevation of left hemidiaphragm with interposed gas bubble. Reticular opacities the bilateral lungs,  similar to the prior. No new interlobular septal thickening. No large pleural effusion. No pneumothorax. No new confluent airspace disease. Retrocardiac opacity is persisting with blunting of left costophrenic angle. IMPRESSION: Similar appearance of the chest x-ray with chronic lung changes and no evidence of acute cardiopulmonary disease. Electronically Signed   By: Corrie Mckusick D.O.   On: 04/02/2019 15:42   Dg Chest Port 1 View  Result Date: 04/02/2019 CLINICAL DATA:  85 year old male with fever and cough. EXAM: PORTABLE CHEST 1 VIEW COMPARISON:  Chest radiograph dated 03/31/2019 FINDINGS: Left lung base density, likely atelectasis. Infiltrate is not excluded. Clinical correlation is recommended. There is diffuse interstitial prominence similar prior radiograph. No new consolidative changes. No large pleural effusion or pneumothorax. Stable cardiac silhouette. Moderate size hiatal hernia. No acute osseous pathology. IMPRESSION: No interval change. Electronically Signed   By: Anner Crete M.D.   On: 04/02/2019 02:25      Impression / Plan:   [name] is a 85 y.o. male with history of CHF, stage IV CKD, history of prostate cancer, complicated acute cholecystitis with abscess status post cholecystostomy drain placement, drain infection with E. coli and Enterobacteriaceae in 2018, chronically elevated LFTs since 03/2018 admitted with abdominal pain and fever secondary to E. coli and Enterobacter bacteremia currently on meropenem.  GI is consulted for elevated LFTs.  His LFTs are consistent with obstructive pattern suggesting cholestasis, either intrahepatic or extrahepatic.  He probably has underlying cirrhosis given his history of heavy alcohol use and  congestive heart failure.  Secondary liver disease work-up has been negative in the past.  Most recent CT and ultrasound did not reveal biliary dilation or obstruction.  Recommend MRCP to evaluate for choledocholithiasis.  Given that he has gram-negative bacteremia, suspect if he has chronic gallbladder or ongoing biliary pathology.  Other differentials include drug-induced liver injury.  Liver biopsy can be considered once infection is cleared.  Endoscopic ultrasound can be considered as outpatient as well  Thank you for involving me in the care of this patient.  Dr. Vicente Males will cover for the weekend    LOS: 1 day   Sherri Sear, MD  04/03/2019, 6:40 PM   Note: This dictation was prepared with Dragon dictation along with smaller phrase technology. Any transcriptional errors that result from this process are unintentional.

## 2019-04-03 NOTE — Progress Notes (Signed)
RN spoke with the patient's stepson [name]) which indicates the patient can become deliriums. The patient started to show signs of delirium tonight. Night shift RN notified about possibly the patient getting delirious over the night and to keep and eye on the patient and notify the MD if it gets worse.

## 2019-04-03 NOTE — Progress Notes (Addendum)
Initial Nutrition Assessment  DOCUMENTATION CODES:   Non-severe (moderate) malnutrition in context of chronic illness  INTERVENTION:   Ensure Enlive po BID, each supplement provides 350 kcal and 20 grams of protein  Magic cup TID with meals, each supplement provides 290 kcal and 9 grams of protein  MVI daily  NUTRITION DIAGNOSIS:   Moderate Malnutrition related to chronic illness(prostate cancer, CHF, advanced age) as evidenced by moderate fat depletion, moderate muscle depletion.  GOAL:   Patient will meet greater than or equal to 90% of their needs  MONITOR:   PO intake, Supplement acceptance, Labs, Weight trends, Skin, I & O's  REASON FOR ASSESSMENT:   Consult Assessment of nutrition requirement/status  ASSESSMENT:   85 y.o. male with pertinent past medical history of CAD, chronic diastolic congestive heart failure, hyperlipidemia, hypertension, prostate cancer, CKD stage IV, chronic left hydronephrosis and chronic elevated LFTs presenting to the ED with chief complaints of fever and abdominal pain. Pt found to have E-coli bacteremia  Met with patient in room today. Pt reports poor appetite and oral intake for several days pta; pt reports his appetite is improved today. Pt eating 25-30% of meals in hospital. Pt reports that he has just gotten his dentures today and that he was able to eat better with those in. Pt reports that he eats well at baseline. Pt reports trouble swallowing sometimes and food getting stuck in his chest area; pt is noted to have large hiatal hernia. Pt does not drink supplements at home but is willing to try them in hospital. Pt loves ice cream. RD will add supplements and MVI to help pt meet his estimated needs. Per chart, pt is weight stable pta.    Medications reviewed and include: aspirin, calcitriol, D3, ferrous sulfate, synthroid, lovaza, Na bicarbonate, meropenem   Labs reviewed: K 4.5 wnl, P 4.5 wnl, Mg 2.3 wnl BUN 48(H), creat 3.39(H), Alk  phos 206(H), AST 133(H), ALT 160(H), tbili 4.0(H) Hgb 8.8(L), Hct 26.3(L)  NUTRITION - FOCUSED PHYSICAL EXAM:    Most Recent Value  Orbital Region  No depletion  Upper Arm Region  Moderate depletion  Thoracic and Lumbar Region  Moderate depletion  Buccal Region  Mild depletion  Temple Region  Mild depletion  Clavicle Bone Region  Mild depletion  Clavicle and Acromion Bone Region  Mild depletion  Scapular Bone Region  Mild depletion  Dorsal Hand  Moderate depletion  Patellar Region  Severe depletion  Anterior Thigh Region  Severe depletion  Posterior Calf Region  Severe depletion  Edema (RD Assessment)  None  Hair  Reviewed  Eyes  Reviewed  Mouth  Reviewed  Skin  Reviewed  Nails  Reviewed     Diet Order:   Diet Order            DIET DYS 3 Room service appropriate? Yes with Assist; Fluid consistency: Thin  Diet effective now             EDUCATION NEEDS:   Education needs have been addressed  Skin:  Skin Assessment: Reviewed RN Assessment(ecchymosis)  Last BM:  9/24- type 5  Height:   Ht Readings from Last 1 Encounters:  04/02/19 '5\' 8"'$  (1.727 m)    Weight:   Wt Readings from Last 1 Encounters:  04/02/19 65.3 kg    Ideal Body Weight:  70 kg  BMI:  Body mass index is 21.89 kg/m.  Estimated Nutritional Needs:   Kcal:  1600-1800kcal/day  Protein:  80-90g/day  Fluid:  >1.8L/day  Jesiel Garate MS, RD, LDN Pager #- [phone] Office#- [phone] After Hours Pager: [phone]  

## 2019-04-04 DIAGNOSIS — K831 Obstruction of bile duct: Secondary | ICD-10-CM

## 2019-04-04 LAB — IGG, IGA, IGM
IgA: 67 mg/dL (ref 61–437)
IgG (Immunoglobin G), Serum: 589 mg/dL — ABNORMAL LOW (ref 603–1613)
IgM (Immunoglobulin M), Srm: 47 mg/dL (ref 15–143)

## 2019-04-04 LAB — BASIC METABOLIC PANEL
Anion gap: 12 (ref 5–15)
BUN: 50 mg/dL — ABNORMAL HIGH (ref 8–23)
CO2: 18 mmol/L — ABNORMAL LOW (ref 22–32)
Calcium: 8 mg/dL — ABNORMAL LOW (ref 8.9–10.3)
Chloride: 104 mmol/L (ref 98–111)
Creatinine, Ser: 3.44 mg/dL — ABNORMAL HIGH (ref 0.61–1.24)
GFR calc Af Amer: 17 mL/min — ABNORMAL LOW (ref >60)
GFR calc non Af Amer: 15 mL/min — ABNORMAL LOW (ref >60)
Glucose, Bld: 112 mg/dL — ABNORMAL HIGH (ref 70–99)
Potassium: 3.9 mmol/L (ref 3.5–5.1)
Sodium: 134 mmol/L — ABNORMAL LOW (ref 135–145)

## 2019-04-04 LAB — CBC
HCT: 30 % — ABNORMAL LOW (ref 39.0–52.0)
Hemoglobin: 10 g/dL — ABNORMAL LOW (ref 13.0–17.0)
MCH: 30.8 pg (ref 26.0–34.0)
MCHC: 33.3 g/dL (ref 30.0–36.0)
MCV: 92.3 fL (ref 80.0–100.0)
Platelets: 75 10*3/uL — ABNORMAL LOW (ref 150–400)
RBC: 3.25 MIL/uL — ABNORMAL LOW (ref 4.22–5.81)
RDW: 14.8 % (ref 11.5–15.5)
WBC: 4.8 10*3/uL (ref 4.0–10.5)
nRBC: 0 % (ref 0.0–0.2)

## 2019-04-04 LAB — CULTURE, BLOOD (ROUTINE X 2)
Special Requests: ADEQUATE
Special Requests: ADEQUATE

## 2019-04-04 LAB — TISSUE TRANSGLUTAMINASE, IGA: Tissue Transglutaminase Ab, IgA: <2 U/mL (ref 0–3)

## 2019-04-04 LAB — PARATHYROID HORMONE, INTACT (NO CA): PTH: 79 pg/mL — ABNORMAL HIGH (ref 15–65)

## 2019-04-04 LAB — FOLATE: Folate: 5.9 ng/mL — ABNORMAL LOW (ref >5.9)

## 2019-04-04 LAB — VITAMIN B12: Vitamin B-12: 391 pg/mL (ref 180–914)

## 2019-04-04 MED ORDER — FOLIC ACID 1 MG PO TABS
1.0000 mg | ORAL_TABLET | Freq: Every day | ORAL | Status: DC
  Administered 2019-04-04 – 2019-04-07 (×3): 1 mg via ORAL
  Filled 2019-04-04 (×3): qty 1

## 2019-04-04 MED ORDER — SODIUM CHLORIDE 0.9 % IV SOLN
2.0000 g | INTRAVENOUS | Status: DC
  Administered 2019-04-04: 12:00:00 2 g via INTRAVENOUS
  Filled 2019-04-04: qty 20
  Filled 2019-04-04: qty 2

## 2019-04-04 NOTE — Consult Note (Signed)
PHARMACY CONSULT NOTE - FOLLOW UP  Pharmacy Consult for Electrolyte Monitoring and Replacement   Recent Labs: Potassium (mmol/L)  Date Value  04/04/2019 3.9  03/07/2012 3.4 (L)   Magnesium (mg/dL)  Date Value  04/03/2019 2.3  03/06/2012 1.9   Calcium (mg/dL)  Date Value  04/04/2019 8.0 (L)   Calcium, Total (mg/dL)  Date Value  03/07/2012 8.2 (L)   Albumin (g/dL)  Date Value  04/03/2019 2.0 (L)  03/05/2012 3.5   Phosphorus (mg/dL)  Date Value  04/03/2019 4.5   Sodium (mmol/L)  Date Value  04/04/2019 134 (L)  03/07/2012 142    Assessment: 85 y.o. male with pertinent past medical history of CAD, chronic diastolic congestive heart failure, hyperlipidemia, hypertension, prostate cancer, CKD stage IV, chronic left hydronephrosis, and chronic elevated LFTs presenting to the ED with chief complaints of fever and abdominal pain. Has sodium bicarb 1300mg  bid ordered.  Pharmacy has been consulted to monitor and replenish electrolytes.  Goal of Therapy:  Electrolytes wnl's  Plan:  K 3.9 Scr 3.44 No further electrolyte supplementation warranted at this time   Will f/u with am labs- (if stable consider checking electrolytes every 2 days)  Noralee Space, PharmD, BCPS Clinical Pharmacist 04/04/2019 9:25 AM

## 2019-04-04 NOTE — Progress Notes (Signed)
[name] , MD [address], [place], [place], [place], [address] [address], [place], [place], [place], [address] Phone: [phone]  Fax: [phone]   [name] is being followed for fever, abnormal LFTs, follow-up day 1.  Subjective: Feels well, no pain , no discomfort    Objective: Vital signs in last 24 hours: Vitals:   04/03/19 0457 04/03/19 1540 04/03/19 2014 04/04/19 0510  BP: (!) 111/56 110/66 112/74 121/72  Pulse: 61 66 71 74  Resp: 20 20 20 20   Temp: 98.2 F (36.8 C) 98.1 F (36.7 C) 97.6 F (36.4 C) 98.9 F (37.2 C)  TempSrc: Oral Oral Oral Oral  SpO2: 95% 96% 98% 96%  Weight:      Height:       Weight change:   Intake/Output Summary (Last 24 hours) at 04/04/2019 6629 Last data filed at 04/04/2019 4765 Gross per 24 hour  Intake 220 ml  Output 550 ml  Net -330 ml     Exam: Heart:: Regular rate and rhythm, S1S2 present or without murmur or extra heart sounds Lungs: normal, clear to auscultation and clear to auscultation and percussion Abdomen: soft, nontender, normal bowel sounds   Lab Results: @LABTEST2 @ Micro Results: Recent Results (from the past 240 hour(s))  SARS Coronavirus 2 [facility] order, Performed in [facility] lab) Nasopharyngeal Nasopharyngeal Swab     Status: None   Collection Time: 03/31/19  4:09 AM   Specimen: Nasopharyngeal Swab  Result Value Ref Range Status   SARS Coronavirus 2 NEGATIVE NEGATIVE Final    Comment: (NOTE) If result is NEGATIVE SARS-CoV-2 target nucleic acids are NOT DETECTED. The SARS-CoV-2 RNA is generally detectable in upper and lower  respiratory specimens during the acute phase of infection. The lowest  concentration of SARS-CoV-2 viral copies this assay can detect is 250  copies / mL. A negative result does not preclude SARS-CoV-2 infection  and should not be used as the sole basis for treatment or other  patient management decisions.  A negative result may occur with  improper  specimen collection / handling, submission of specimen other  than nasopharyngeal swab, presence of viral mutation(s) within the  areas targeted by this assay, and inadequate number of viral copies  (<250 copies / mL). A negative result must be combined with clinical  observations, patient history, and epidemiological information. If result is POSITIVE SARS-CoV-2 target nucleic acids are DETECTED. The SARS-CoV-2 RNA is generally detectable in upper and lower  respiratory specimens dur ing the acute phase of infection.  Positive  results are indicative of active infection with SARS-CoV-2.  Clinical  correlation with patient history and other diagnostic information is  necessary to determine patient infection status.  Positive results do  not rule out bacterial infection or co-infection with other viruses. If result is PRESUMPTIVE POSTIVE SARS-CoV-2 nucleic acids MAY BE PRESENT.   A presumptive positive result was obtained on the submitted specimen  and confirmed on repeat testing.  While 2019 novel coronavirus  (SARS-CoV-2) nucleic acids may be present in the submitted sample  additional confirmatory testing may be necessary for epidemiological  and / or clinical management purposes  to differentiate between  SARS-CoV-2 and other Sarbecovirus currently known to infect humans.  If clinically indicated additional testing with an alternate test  methodology [phone]) is advised. The SARS-CoV-2 RNA is generally  detectable in upper and lower respiratory sp ecimens during the acute  phase of infection. The expected result is Negative. Fact Sheet for  Patients:  StrictlyIdeas.no Fact Sheet for Healthcare Providers: BankingDealers.co.za This test is not yet approved or cleared by the [place] FDA and has been authorized for detection and/or diagnosis of SARS-CoV-2 by FDA under an Emergency Use Authorization (EUA).  This EUA will remain in  effect (meaning this test can be used) for the duration of the COVID-19 declaration under Section 564(b)(1) of the Act, 21 U.S.C. section 360bbb-3(b)(1), unless the authorization is terminated or revoked sooner. Performed at [facility], [address] [place], [place] [address]   Urine culture     Status: None   Collection Time: 03/31/19 10:34 AM   Specimen: Urine, Random  Result Value Ref Range Status   Specimen Description   Final    URINE, RANDOM Performed at [facility], [address], [place], [place] [address]    Special Requests   Final    NONE Performed at [facility], [address], [place], [place] [address]    Culture   Final    NO GROWTH Performed at [facility], [place] [address], [place], [place] [address]    Report Status 04/01/2019 FINAL  Final  Blood Culture (routine x 2)     Status: Abnormal   Collection Time: 04/02/19  1:33 AM   Specimen: BLOOD  Result Value Ref Range Status   Specimen Description   Final    BLOOD LEFT ANTECUBITAL Performed at [facility], [place] [address], [place], [place] [address]    Special Requests   Final    BOTTLES DRAWN AEROBIC AND ANAEROBIC Blood Culture adequate volume Performed at [address] [place], [place] [address]    Culture  Setup Time   Final    GRAM NEGATIVE RODS ANAEROBIC BOTTLE ONLY CRITICAL RESULT CALLED TO, READ BACK BY AND VERIFIED WITH: SHEEMA HALLAJI AT 4008 ON 04/02/19 [facility] Performed at [facility], [place] [address] [place], [place] [address]    Culture ESCHERICHIA COLI (A)  Final   Report Status 04/04/2019 FINAL  Final   Organism ID, Bacteria ESCHERICHIA COLI  Final      Susceptibility   Escherichia coli - MIC*    AMPICILLIN 16 INTERMEDIATE Intermediate     CEFAZOLIN <=4 SENSITIVE Sensitive     CEFEPIME <=1 SENSITIVE Sensitive     CEFTAZIDIME <=1 SENSITIVE Sensitive     CEFTRIAXONE <=1 SENSITIVE Sensitive     CIPROFLOXACIN  >=4 RESISTANT Resistant     GENTAMICIN <=1 SENSITIVE Sensitive     IMIPENEM 0.5 SENSITIVE Sensitive     TRIMETH/SULFA <=20 SENSITIVE Sensitive     AMPICILLIN/SULBACTAM 16 INTERMEDIATE Intermediate     PIP/TAZO 8 SENSITIVE Sensitive     Extended ESBL NEGATIVE Sensitive     * ESCHERICHIA COLI  Blood Culture (routine x 2)     Status: Abnormal   Collection Time: 04/02/19  1:33 AM   Specimen: BLOOD  Result Value Ref Range Status   Specimen Description   Final    BLOOD RIGHT ANTECUBITAL Performed at [facility], [place] [address] [place], [place] [address]    Special Requests   Final    BOTTLES DRAWN AEROBIC AND ANAEROBIC Blood Culture adequate volume Performed at [facility], [address] [place], [place] [address]    Culture  Setup Time   Final    GRAM NEGATIVE RODS CRITICAL RESULT CALLED TO, READ BACK BY AND VERIFIED WITH: CRITICAL VALUE NOTED.  VALUE IS CONSISTENT  WITH PREVIOUSLY REPORTED AND CALLED VALUE. Performed at [facility], [place] [place], [place] [address]    Culture (A)  Final    ESCHERICHIA COLI SUSCEPTIBILITIES PERFORMED ON PREVIOUS CULTURE WITHIN THE LAST 5 DAYS. Performed at [facility], [place] [address], [place], [place] [address]    Report Status 04/04/2019 FINAL  Final  Urine culture     Status: None   Collection Time: 04/02/19  1:33 AM   Specimen: In/Out Cath Urine  Result Value Ref Range Status   Specimen Description   Final    IN/OUT CATH URINE Performed at [facility], [address] [place], [place] [address]    Special Requests   Final    NONE Performed at [name], [address] [place], [place] [address]    Culture   Final    NO GROWTH Performed at [facility], [place] [address] [place], [place] [address]    Report Status 04/03/2019 FINAL  Final  Blood Culture ID Panel (Reflexed)     Status: Abnormal   Collection Time: 04/02/19  1:33 AM  Result Value Ref Range Status     Enterococcus species NOT DETECTED NOT DETECTED Final   Listeria monocytogenes NOT DETECTED NOT DETECTED Final   Staphylococcus species NOT DETECTED NOT DETECTED Final   Staphylococcus aureus (BCID) NOT DETECTED NOT DETECTED Final   Streptococcus species NOT DETECTED NOT DETECTED Final   Streptococcus agalactiae NOT DETECTED NOT DETECTED Final   Streptococcus pneumoniae NOT DETECTED NOT DETECTED Final   Streptococcus pyogenes NOT DETECTED NOT DETECTED Final   Acinetobacter baumannii NOT DETECTED NOT DETECTED Final   Enterobacteriaceae species DETECTED (A) NOT DETECTED Final    Comment: Enterobacteriaceae represent a large family of gram-negative bacteria, not a single organism. CRITICAL RESULT CALLED TO, READ BACK BY AND VERIFIED WITH: SHEEMA HALLAJI AT 5643 ON 04/02/2019 [place].    Enterobacter cloacae complex NOT DETECTED NOT DETECTED Final   Escherichia coli DETECTED (A) NOT DETECTED Final    Comment: CRITICAL RESULT CALLED TO, READ BACK BY AND VERIFIED WITH: SHEEMA HALLAJI AT 3295 ON 04/02/19 [place].    Klebsiella oxytoca NOT DETECTED NOT DETECTED Final   Klebsiella pneumoniae NOT DETECTED NOT DETECTED Final   Proteus species NOT DETECTED NOT DETECTED Final   Serratia marcescens NOT DETECTED NOT DETECTED Final   Carbapenem resistance NOT DETECTED NOT DETECTED Final   Haemophilus influenzae NOT DETECTED NOT DETECTED Final   Neisseria meningitidis NOT DETECTED NOT DETECTED Final   Pseudomonas aeruginosa NOT DETECTED NOT DETECTED Final   Candida albicans NOT DETECTED NOT DETECTED Final   Candida glabrata NOT DETECTED NOT DETECTED Final   Candida krusei NOT DETECTED NOT DETECTED Final   Candida parapsilosis NOT DETECTED NOT DETECTED Final   Candida tropicalis NOT DETECTED NOT DETECTED Final    Comment: Performed at [facility], [address], [place], [place] [address]   Studies/Results: Mr Abdomen Mrcp Wo Contrast  Result Date: 04/03/2019 CLINICAL DATA:  Jaundice.  Abdominal pain, fever. Chronic renal disease. EXAM: MRI ABDOMEN WITHOUT CONTRAST  (INCLUDING MRCP) TECHNIQUE: Multiplanar multisequence MR imaging of the abdomen was performed. Heavily T2-weighted images of the biliary and pancreatic ducts were obtained, and three-dimensional MRCP images were rendered by post processing. COMPARISON:  CT March 31, 2019 FINDINGS: Lower chest: Small bilateral pleural effusions. Large hiatal hernia with at least half the stomach within LEFT hemithorax posterior the heart. Hepatobiliary: No intrahepatic biliary duct dilatation. The common hepatic duct  is normal caliber at 7 mm. The common bile duct upper limits normal caliber. In the distal common bile duct there is a potential filling defect (image 5/14 MRCP sequence; and image 20/3 of T2 weighted series). This is inconclusive and there is significant motion artifact. No gallstones within the gallbladder which weighs against choledocholithiasis. Gallbladder is not distended. No pericholecystic fluid. Pancreas: No pancreatic inflammation identified. No duct dilatation. 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 One cystic lesion in tail the pancreas measures 7 mm. Spleen: Several small lesions in the spleen likely represent benign cysts. Adrenals/urinary tract: Adrenal glands normal. Chronic hydronephrosis of the LEFT kidney with severe cortical atrophy. Stomach/Bowel: Large hiatal hernia. No abnormality of the limited view small-bowel and colon. Vascular/Lymphatic: Abdominal aortic normal caliber. No retroperitoneal periportal lymphadenopathy. Musculoskeletal: No aggressive osseous lesion IMPRESSION: 1. Potential distal common bile duct stone (choledocholithiasis). Significant motion degradation. Equivocal exam. 2. Minimal intra or extrahepatic biliary duct dilatation. 3. No evidence of acute cholecystitis. No evidence of cholelithiasis. 4. Large hiatal hernia. Chronic LEFT hydronephrosis and cortical atrophy. 5. Side  branch ductal ectasia small cystic lesion in the pancreas. Lesions of this size do not require follow-up in this age group. This recommendation follows ACR consensus guidelines: Management of Incidental Pancreatic Cysts: A White Paper of the ACR Incidental Findings Committee. [place] 4580;99:[id number]. Electronically Signed   By: Suzy Bouchard M.D.   On: 04/03/2019 22:06   Mr 3d Recon At Scanner  Result Date: 04/03/2019 CLINICAL DATA:  Jaundice. Abdominal pain, fever. Chronic renal disease. EXAM: MRI ABDOMEN WITHOUT CONTRAST  (INCLUDING MRCP) TECHNIQUE: Multiplanar multisequence MR imaging of the abdomen was performed. Heavily T2-weighted images of the biliary and pancreatic ducts were obtained, and three-dimensional MRCP images were rendered by post processing. COMPARISON:  CT March 31, 2019 FINDINGS: Lower chest: Small bilateral pleural effusions. Large hiatal hernia with at least half the stomach within LEFT hemithorax posterior the heart. Hepatobiliary: No intrahepatic biliary duct dilatation. The common hepatic duct is normal caliber at 7 mm. The common bile duct upper limits normal caliber. In the distal common bile duct there is a potential filling defect (image 5/14 MRCP sequence; and image 20/3 of T2 weighted series). This is inconclusive and there is significant motion artifact. No gallstones within the gallbladder which weighs against choledocholithiasis. Gallbladder is not distended. No pericholecystic fluid. Pancreas: No pancreatic inflammation identified. No duct dilatation. 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 One cystic lesion in tail the pancreas measures 7 mm. Spleen: Several small lesions in the spleen likely represent benign cysts. Adrenals/urinary tract: Adrenal glands normal. Chronic hydronephrosis of the LEFT kidney with severe cortical atrophy. Stomach/Bowel: Large hiatal hernia. No abnormality of the limited view small-bowel and colon. Vascular/Lymphatic:  Abdominal aortic normal caliber. No retroperitoneal periportal lymphadenopathy. Musculoskeletal: No aggressive osseous lesion IMPRESSION: 1. Potential distal common bile duct stone (choledocholithiasis). Significant motion degradation. Equivocal exam. 2. Minimal intra or extrahepatic biliary duct dilatation. 3. No evidence of acute cholecystitis. No evidence of cholelithiasis. 4. Large hiatal hernia. Chronic LEFT hydronephrosis and cortical atrophy. 5. 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 Lesions of this size do not require follow-up in this age group. This recommendation follows ACR consensus guidelines: Management of Incidental Pancreatic Cysts: A White Paper of the ACR Incidental Findings Committee. [place] 0539;76:[id number]. Electronically Signed   By: Suzy Bouchard M.D.   On: 04/03/2019 22:06   Dg Chest Port 1 View  Result Date: 04/02/2019  CLINICAL DATA:  85 year old male with a history of productive cough EXAM: PORTABLE CHEST 1 VIEW COMPARISON:  April 02, 2019, March 31, 2019 FINDINGS: Cardiomediastinal silhouette unchanged in size and contour. Asymmetric elevation of left hemidiaphragm with interposed gas bubble. Reticular opacities the bilateral lungs, similar to the prior. No new interlobular septal thickening. No large pleural effusion. No pneumothorax. No new confluent airspace disease. Retrocardiac opacity is persisting with blunting of left costophrenic angle. IMPRESSION: Similar appearance of the chest x-ray with chronic lung changes and no evidence of acute cardiopulmonary disease. Electronically Signed   By: Corrie Mckusick D.O.   On: 04/02/2019 15:42   Medications: I have reviewed the patient's current medications. Scheduled Meds:  aspirin EC  81 mg Oral QHS   calcitRIOL  0.25 mcg Oral Once per day on Mon Wed Fri   cholecalciferol  5,000 Units Oral Daily   doxazosin  4 mg Oral QHS   feeding supplement (ENSURE ENLIVE)  237 mL Oral BID BM    ferrous sulfate  325 mg Oral BID   influenza vaccine adjuvanted  0.5 mL Intramuscular Tomorrow-1000   isosorbide mononitrate  30 mg Oral Daily   levothyroxine  88 mcg Oral Daily   multivitamin with minerals  1 tablet Oral Daily   omega-3 acid ethyl esters  1 g Oral Daily   pantoprazole  40 mg Oral BID   sodium bicarbonate  1,300 mg Oral BID   tamsulosin  0.4 mg Oral QHS   Continuous Infusions:  meropenem (MERREM) IV Stopped (04/04/19 0219)   PRN Meds:.acetaminophen   Assessment: Active Problems:   Sepsis ([place])   Malnutrition of moderate degree   [name] 85 y.o. male admitted on 04/02/2019 for fever and abdominal pain.  Had leukopenia, lactic acidosis, was febrile.  Commenced on empiric antibiotics.  He has a history of chronic diastolic heart failure.  GI was consulted on 04/03/2019 for elevated LFTs.  Has been elevated for a while since April 2018.  History of excess alcohol consumption.  Bilirubin has been chronically elevated which is not new.  PTH is elevated and he may have hyperparathyroidism leading to elevated alkaline phosphatase.  MRCP showed potential distal common bile duct stone.  Equivocal exam.  Minimal intra-or extrahepatic biliary dilation.  No evidence of cholelithiasis.  Plan: 1.  Low folate please replace. 2.  Elevated parathyroid hormone likely secondary to CKD.  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 3.  Abnormal MRCP possible distal obstruction. Discussed with Dr Allen Norris - plan for ERCP Monday morning .  It Is possible that this obstruction may be causing cholangitis.  Other differential is Spinchter of Oddi  Dysfunction Type 1 4. Continue antibiotics  5. Explained to the patient about the procedure- not too confident he understood completely  6. I have called and explained about the procedure with his spouse Yoshito Gaza .    LOS: 2 days   Kirk Bellows, MD 04/04/2019, 9:27 AM

## 2019-04-04 NOTE — Progress Notes (Addendum)
[place] at [facility]                                                                                                                                                                                  Patient Demographics   [name], is a 85 y.o. male, DOB - October 10, 1926, OEV:[id number]  Admit date - 04/02/2019   Admitting Physician Lang Snow, NP  Outpatient Primary MD for the patient is Care, Rafael Capo   LOS - 2  Subjective: Patient states he is feeling fine today. He denies any fevers, chills, abdominal pain, nausea, or vomiting.  Review of Systems:   CONSTITUTIONAL: No documented fever. No fatigue, weakness. No weight gain, no weight loss.  EYES: No blurry or double vision.  ENT: No tinnitus. No postnasal drip. No redness of the oropharynx.  RESPIRATORY: No cough, no wheeze, no hemoptysis. No dyspnea.  CARDIOVASCULAR: No chest pain. No orthopnea. No palpitations. No syncope.  GASTROINTESTINAL: No nausea, no vomiting or diarrhea. No abdominal pain. No melena or hematochezia.  GENITOURINARY: No dysuria or hematuria.  ENDOCRINE: No polyuria or nocturia. No heat or cold intolerance.  HEMATOLOGY: No anemia. No bruising. No bleeding.  INTEGUMENTARY: No rashes. No lesions.  MUSCULOSKELETAL: No arthritis. No swelling. No gout.  NEUROLOGIC: No numbness, tingling, or ataxia. No seizure-type activity.  PSYCHIATRIC: No anxiety. No insomnia. No ADD.   Vitals:   Vitals:   04/03/19 0457 04/03/19 1540 04/03/19 2014 04/04/19 0510  BP: (!) 111/56 110/66 112/74 121/72  Pulse: 61 66 71 74  Resp: 20 20 20 20   Temp: 98.2 F (36.8 C) 98.1 F (36.7 C) 97.6 F (36.4 C) 98.9 F (37.2 C)  TempSrc: Oral Oral Oral Oral  SpO2: 95% 96% 98% 96%  Weight:      Height:        Wt Readings from Last 3 Encounters:  04/02/19 65.3 kg  03/31/19 67 kg  05/28/18 66 kg     Intake/Output Summary (Last 24 hours) at 04/04/2019 1058 Last data filed at  04/04/2019 0824 Gross per 24 hour  Intake 220 ml  Output 550 ml  Net -330 ml    Physical Exam:   GENERAL: Pleasant-appearing in no apparent distress.  HEENT: Atraumatic, normocephalic. Extraocular muscles are intact. Pupils equal and reactive to light. Sclerae anicteric. No conjunctival injection. No oro-pharyngeal erythema.  NECK: Supple. There is no jugular venous distention. No bruits, no lymphadenopathy, no thyromegaly.  HEART: Regular rate and rhythm,. No murmurs, no rubs, no clicks.  LUNGS: Clear to auscultation bilaterally. No rales or rhonchi. No wheezes.  ABDOMEN: Soft, flat, nontender, nondistended. Has good bowel sounds. No hepatosplenomegaly appreciated.  EXTREMITIES: No evidence of any cyanosis, clubbing, or peripheral edema.  +2 pedal and radial pulses bilaterally.  NEUROLOGIC: The patient is alert, awake, and oriented x3 with no focal motor or sensory deficits appreciated bilaterally.  SKIN: Moist and warm with no rashes appreciated.  Psych: Not anxious, depressed  Antibiotics   Anti-infectives (From admission, onward)   Start     Dose/Rate Route Frequency Ordered Stop   04/04/19 1200  cefTRIAXone (ROCEPHIN) 2 g in sodium chloride 0.9 % 100 mL IVPB     2 g 200 mL/hr over 30 Minutes Intravenous Every 24 hours 04/04/19 0945     04/03/19 1500  vancomycin (VANCOCIN) 500 mg in sodium chloride 0.9 % 100 mL IVPB  Status:  Discontinued     500 mg 100 mL/hr over 60 Minutes Intravenous Every 36 hours 04/02/19 0528 04/02/19 1215   04/03/19 0200  meropenem (MERREM) 500 mg in sodium chloride 0.9 % 100 mL IVPB  Status:  Discontinued     500 mg 200 mL/hr over 30 Minutes Intravenous Every 12 hours 04/02/19 1450 04/04/19 0945   04/02/19 0230  meropenem (MERREM) 1 g in sodium chloride 0.9 % 100 mL IVPB  Status:  Discontinued     1 g 200 mL/hr over 30 Minutes Intravenous Every 12 hours 04/02/19 0218 04/02/19 1450   04/02/19 0230  vancomycin (VANCOCIN) 1,500 mg in sodium chloride 0.9 %  500 mL IVPB     1,500 mg 250 mL/hr over 120 Minutes Intravenous  Once 04/02/19 0219 04/02/19 0508   04/02/19 0215  aztreonam (AZACTAM) 2 g in sodium chloride 0.9 % 100 mL IVPB  Status:  Discontinued     2 g 200 mL/hr over 30 Minutes Intravenous  Once 04/02/19 0201 04/02/19 0218   04/02/19 0215  metroNIDAZOLE (FLAGYL) IVPB 500 mg  Status:  Discontinued     500 mg 100 mL/hr over 60 Minutes Intravenous  Once 04/02/19 0201 04/02/19 0218   04/02/19 0215  vancomycin (VANCOCIN) IVPB 1000 mg/200 mL premix  Status:  Discontinued     1,000 mg 200 mL/hr over 60 Minutes Intravenous  Once 04/02/19 0201 04/02/19 0219      Medications   Scheduled Meds: . aspirin EC  81 mg Oral QHS  . calcitRIOL  0.25 mcg Oral Once per day on Mon Wed Fri  . cholecalciferol  5,000 Units Oral Daily  . doxazosin  4 mg Oral QHS  . feeding supplement (ENSURE ENLIVE)  237 mL Oral BID BM  . ferrous sulfate  325 mg Oral BID  . influenza vaccine adjuvanted  0.5 mL Intramuscular Tomorrow-1000  . isosorbide mononitrate  30 mg Oral Daily  . levothyroxine  88 mcg Oral Daily  . multivitamin with minerals  1 tablet Oral Daily  . omega-3 acid ethyl esters  1 g Oral Daily  . pantoprazole  40 mg Oral BID  . sodium bicarbonate  1,300 mg Oral BID  . tamsulosin  0.4 mg Oral QHS   Continuous Infusions: . cefTRIAXone (ROCEPHIN)  IV     PRN Meds:.   Data Review:   Micro Results Recent Results (from the past 240 hour(s))  SARS Coronavirus 2 [facility] order, Performed in [facility] hospital lab) Nasopharyngeal Nasopharyngeal Swab     Status: None   Collection Time: 03/31/19  4:09 AM   Specimen: Nasopharyngeal Swab  Result Value Ref Range Status   SARS Coronavirus 2 NEGATIVE NEGATIVE Final    Comment: (NOTE) If result is NEGATIVE SARS-CoV-2 target nucleic acids  are NOT DETECTED. The SARS-CoV-2 RNA is generally detectable in upper and lower  respiratory specimens during the acute phase of infection. The lowest   concentration of SARS-CoV-2 viral copies this assay can detect is 250  copies / mL. A negative result does not preclude SARS-CoV-2 infection  and should not be used as the sole basis for treatment or other  patient management decisions.  A negative result may occur with  improper specimen collection / handling, submission of specimen other  than nasopharyngeal swab, presence of viral mutation(s) within the  areas targeted by this assay, and inadequate number of viral copies  (<250 copies / mL). A negative result must be combined with clinical  observations, patient history, and epidemiological information. If result is POSITIVE SARS-CoV-2 target nucleic acids are DETECTED. The SARS-CoV-2 RNA is generally detectable in upper and lower  respiratory specimens dur ing the acute phase of infection.  Positive  results are indicative of active infection with SARS-CoV-2.  Clinical  correlation with patient history and other diagnostic information is  necessary to determine patient infection status.  Positive results do  not rule out bacterial infection or co-infection with other viruses. If result is PRESUMPTIVE POSTIVE SARS-CoV-2 nucleic acids MAY BE PRESENT.   A presumptive positive result was obtained on the submitted specimen  and confirmed on repeat testing.  While 2019 novel coronavirus  (SARS-CoV-2) nucleic acids may be present in the submitted sample  additional confirmatory testing may be necessary for epidemiological  and / or clinical management purposes  to differentiate between  SARS-CoV-2 and other Sarbecovirus currently known to infect humans.  If clinically indicated additional testing with an alternate test  methodology [id number] is advised. The SARS-CoV-2 RNA is generally  detectable in upper and lower respiratory sp ecimens during the acute  phase of infection. The expected result is Negative. Fact Sheet for Patients:  StrictlyIdeas.no Fact Sheet  for Healthcare Providers: BankingDealers.co.za This test is not yet approved or cleared by the [place] FDA and has been authorized for detection and/or diagnosis of SARS-CoV-2 by FDA under an Emergency Use Authorization (EUA).  This EUA will remain in effect (meaning this test can be used) for the duration of the COVID-19 declaration under Section 564(b)(1) of the Act, 21 U.S.C. section 360bbb-3(b)(1), unless the authorization is terminated or revoked sooner. Performed at [facility], [address] [place], [place] [address]   Urine culture     Status: None   Collection Time: 03/31/19 10:34 AM   Specimen: Urine, Random  Result Value Ref Range Status   Specimen Description   Final    URINE, RANDOM Performed at [facility], [address], [place], [place] [address]    Special Requests   Final    NONE Performed at [facility], [address], [place], [place] [address]    Culture   Final    NO GROWTH Performed at [facility], [place] [address] [place], [place] [address]    Report Status 04/01/2019 FINAL  Final  Blood Culture (routine x 2)     Status: Abnormal   Collection Time: 04/02/19  1:33 AM   Specimen: BLOOD  Result Value Ref Range Status   Specimen Description   Final    BLOOD LEFT ANTECUBITAL Performed at [facility], [place] [address], [place], [place] [address]    Special Requests   Final    BOTTLES DRAWN AEROBIC AND ANAEROBIC Blood Culture adequate volume Performed at [facility], [place], [place] [address]    Culture  Setup Time   Final    GRAM NEGATIVE RODS ANAEROBIC BOTTLE ONLY CRITICAL RESULT CALLED TO, READ BACK BY AND VERIFIED WITH: SHEEMA HALLAJI AT 3875 ON 04/02/19 [facility] Performed at [facility], [place] [address] [place], [place] [address]    Culture ESCHERICHIA COLI (A)  Final   Report Status 04/04/2019 FINAL  Final   Organism ID, Bacteria ESCHERICHIA  COLI  Final      Susceptibility   Escherichia coli - MIC*    AMPICILLIN 16 INTERMEDIATE Intermediate     CEFAZOLIN <=4 SENSITIVE Sensitive     CEFEPIME <=1 SENSITIVE Sensitive     CEFTAZIDIME <=1 SENSITIVE Sensitive     CEFTRIAXONE <=1 SENSITIVE Sensitive     CIPROFLOXACIN >=4 RESISTANT Resistant     GENTAMICIN <=1 SENSITIVE Sensitive     IMIPENEM 0.5 SENSITIVE Sensitive     TRIMETH/SULFA <=20 SENSITIVE Sensitive     AMPICILLIN/SULBACTAM 16 INTERMEDIATE Intermediate     PIP/TAZO 8 SENSITIVE Sensitive     Extended ESBL NEGATIVE Sensitive     * ESCHERICHIA COLI  Blood Culture (routine x 2)     Status: Abnormal   Collection Time: 04/02/19  1:33 AM   Specimen: BLOOD  Result Value Ref Range Status   Specimen Description   Final    BLOOD RIGHT ANTECUBITAL Performed at [facility], [place] [address], [place], [place] [address]    Special Requests   Final    BOTTLES DRAWN AEROBIC AND ANAEROBIC Blood Culture adequate volume Performed at [facility], [address] [place], [place] [address]    Culture  Setup Time   Final    GRAM NEGATIVE RODS CRITICAL RESULT CALLED TO, READ BACK BY AND VERIFIED WITH: CRITICAL VALUE NOTED.  VALUE IS CONSISTENT WITH PREVIOUSLY REPORTED AND CALLED VALUE. Performed at [facility], [address] [place], [place] [address]    Culture (A)  Final    ESCHERICHIA COLI SUSCEPTIBILITIES PERFORMED ON PREVIOUS CULTURE WITHIN THE LAST 5 DAYS. Performed at [facility], [place] [address], [place], [place] [address]    Report Status 04/04/2019 FINAL  Final  Urine culture     Status: None   Collection Time: 04/02/19  1:33 AM   Specimen: In/Out Cath Urine  Result Value Ref Range Status   Specimen Description   Final    IN/OUT CATH URINE Performed at [facility], [address], [place], [place] [address]    Special Requests   Final    NONE Performed at [facility], [address] [place], [place]  [address]    Culture   Final    NO GROWTH Performed at [facility], [place] [address] [place], [place] [address]    Report Status 04/03/2019 FINAL  Final  Blood Culture ID Panel (Reflexed)     Status: Abnormal   Collection Time: 04/02/19  1:33 AM  Result Value Ref Range Status   Enterococcus species NOT DETECTED NOT DETECTED Final   Listeria monocytogenes NOT DETECTED NOT DETECTED Final   Staphylococcus species NOT DETECTED NOT DETECTED Final   Staphylococcus aureus (BCID) NOT DETECTED NOT DETECTED Final   Streptococcus species NOT DETECTED NOT DETECTED Final   Streptococcus agalactiae NOT DETECTED NOT DETECTED Final   Streptococcus pneumoniae NOT DETECTED NOT DETECTED Final   Streptococcus pyogenes NOT DETECTED NOT DETECTED Final   Acinetobacter baumannii NOT DETECTED  NOT DETECTED Final   Enterobacteriaceae species DETECTED (A) NOT DETECTED Final    Comment: Enterobacteriaceae represent a large family of gram-negative bacteria, not a single organism. CRITICAL RESULT CALLED TO, READ BACK BY AND VERIFIED WITH: SHEEMA HALLAJI AT 7353 ON 04/02/2019 [place].    Enterobacter cloacae complex NOT DETECTED NOT DETECTED Final   Escherichia coli DETECTED (A) NOT DETECTED Final    Comment: CRITICAL RESULT CALLED TO, READ BACK BY AND VERIFIED WITH: SHEEMA HALLAJI AT 2992 ON 04/02/19 [place].    Klebsiella oxytoca NOT DETECTED NOT DETECTED Final   Klebsiella pneumoniae NOT DETECTED NOT DETECTED Final   Proteus species NOT DETECTED NOT DETECTED Final   Serratia marcescens NOT DETECTED NOT DETECTED Final   Carbapenem resistance NOT DETECTED NOT DETECTED Final   Haemophilus influenzae NOT DETECTED NOT DETECTED Final   Neisseria meningitidis NOT DETECTED NOT DETECTED Final   Pseudomonas aeruginosa NOT DETECTED NOT DETECTED Final   Candida albicans NOT DETECTED NOT DETECTED Final   Candida glabrata NOT DETECTED NOT DETECTED Final   Candida krusei NOT DETECTED NOT DETECTED Final   Candida parapsilosis  NOT DETECTED NOT DETECTED Final   Candida tropicalis NOT DETECTED NOT DETECTED Final    Comment: Performed at [facility], [address] [place], [place] [address]    Radiology Reports Ct Abdomen Pelvis Wo Contrast  Result Date: 03/31/2019 CLINICAL DATA:  Acute generalized abdominal pain. EXAM: CT ABDOMEN AND PELVIS WITHOUT CONTRAST TECHNIQUE: Multidetector CT imaging of the abdomen and pelvis was performed following the standard protocol without IV contrast. COMPARISON:  03/08/2018 FINDINGS: Lower chest: Sizable hiatal hernia. Chronic trace pleural fluid at the left base. Coronary calcification. Hepatobiliary: No focal liver abnormality.No evidence of biliary obstruction or stone. Pancreas: Generalized atrophy. Spleen: Unremarkable. Adrenals/Urinary Tract: Negative adrenals. Chronic left hydroureteronephrosis due to distal ureteral calculus with severe left renal atrophy. The left kidney is likely nonfunctional. 15 mm left interpolar calculus. No right hydronephrosis. Thick walled bladder with subtle cellule/diverticulum. Mild perivesicular fat haziness. Stomach/Bowel:  No obstruction. Left colonic diverticulosis. Vascular/Lymphatic: No acute vascular abnormality. No mass or adenopathy. Haziness of fat in the sigmoid mesentery without mass or adenopathy-consistent with chronic mesenteric panniculitis. Reproductive:No acute finding Other: No ascites or pneumoperitoneum. Musculoskeletal: No acute abnormalities. Osteopenia and spinal degeneration IMPRESSION: 1. Possible cystitis. 2. Finding suggesting chronic bladder outlet obstruction. There is only mild bladder distention currently. 3. 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 4. Large hiatal hernia. Electronically Signed   By: Monte Fantasia M.D.   On: 03/31/2019 06:24   Mr Abdomen Mrcp Wo Contrast  Result Date: 04/03/2019 CLINICAL DATA:  Jaundice. Abdominal pain, fever. Chronic renal disease. EXAM: MRI ABDOMEN  WITHOUT CONTRAST  (INCLUDING MRCP) TECHNIQUE: Multiplanar multisequence MR imaging of the abdomen was performed. Heavily T2-weighted images of the biliary and pancreatic ducts were obtained, and three-dimensional MRCP images were rendered by post processing. COMPARISON:  CT March 31, 2019 FINDINGS: Lower chest: Small bilateral pleural effusions. Large hiatal hernia with at least half the stomach within LEFT hemithorax posterior the heart. Hepatobiliary: No intrahepatic biliary duct dilatation. The common hepatic duct is normal caliber at 7 mm. The common bile duct upper limits normal caliber. In the distal common bile duct there is a potential filling defect (image 5/14 MRCP sequence; and image 20/3 of T2 weighted series). This is inconclusive and there is significant motion artifact. No gallstones within the gallbladder which weighs against choledocholithiasis. Gallbladder is not distended. No pericholecystic fluid. Pancreas: No pancreatic  inflammation identified. No duct dilatation. 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 One cystic lesion in tail the pancreas measures 7 mm. Spleen: Several small lesions in the spleen likely represent benign cysts. Adrenals/urinary tract: Adrenal glands normal. Chronic hydronephrosis of the LEFT kidney with severe cortical atrophy. Stomach/Bowel: Large hiatal hernia. No abnormality of the limited view small-bowel and colon. Vascular/Lymphatic: Abdominal aortic normal caliber. No retroperitoneal periportal lymphadenopathy. Musculoskeletal: No aggressive osseous lesion IMPRESSION: 1. Potential distal common bile duct stone (choledocholithiasis). Significant motion degradation. Equivocal exam. 2. Minimal intra or extrahepatic biliary duct dilatation. 3. No evidence of acute cholecystitis. No evidence of cholelithiasis. 4. Large hiatal hernia. Chronic LEFT hydronephrosis and cortical atrophy. 5. 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 Lesions  of this size do not require follow-up in this age group. This recommendation follows ACR consensus guidelines: Management of Incidental Pancreatic Cysts: A White Paper of the ACR Incidental Findings Committee. [place] 2841;32:[id number]. Electronically Signed   By: Suzy Bouchard M.D.   On: 04/03/2019 22:06   Mr 3d Recon At Scanner  Result Date: 04/03/2019 CLINICAL DATA:  Jaundice. Abdominal pain, fever. Chronic renal disease. EXAM: MRI ABDOMEN WITHOUT CONTRAST  (INCLUDING MRCP) TECHNIQUE: Multiplanar multisequence MR imaging of the abdomen was performed. Heavily T2-weighted images of the biliary and pancreatic ducts were obtained, and three-dimensional MRCP images were rendered by post processing. COMPARISON:  CT March 31, 2019 FINDINGS: Lower chest: Small bilateral pleural effusions. Large hiatal hernia with at least half the stomach within LEFT hemithorax posterior the heart. Hepatobiliary: No intrahepatic biliary duct dilatation. The common hepatic duct is normal caliber at 7 mm. The common bile duct upper limits normal caliber. In the distal common bile duct there is a potential filling defect (image 5/14 MRCP sequence; and image 20/3 of T2 weighted series). This is inconclusive and there is significant motion artifact. No gallstones within the gallbladder which weighs against choledocholithiasis. Gallbladder is not distended. No pericholecystic fluid. Pancreas: No pancreatic inflammation identified. No duct dilatation. 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 One cystic lesion in tail the pancreas measures 7 mm. Spleen: Several small lesions in the spleen likely represent benign cysts. Adrenals/urinary tract: Adrenal glands normal. Chronic hydronephrosis of the LEFT kidney with severe cortical atrophy. Stomach/Bowel: Large hiatal hernia. No abnormality of the limited view small-bowel and colon. Vascular/Lymphatic: Abdominal aortic normal caliber. No retroperitoneal periportal  lymphadenopathy. Musculoskeletal: No aggressive osseous lesion IMPRESSION: 1. Potential distal common bile duct stone (choledocholithiasis). Significant motion degradation. Equivocal exam. 2. Minimal intra or extrahepatic biliary duct dilatation. 3. No evidence of acute cholecystitis. No evidence of cholelithiasis. 4. Large hiatal hernia. Chronic LEFT hydronephrosis and cortical atrophy. 5. 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 Lesions of this size do not require follow-up in this age group. This recommendation follows ACR consensus guidelines: Management of Incidental Pancreatic Cysts: A White Paper of the ACR Incidental Findings Committee. [place] 7253;66:[id number]. Electronically Signed   By: Suzy Bouchard M.D.   On: 04/03/2019 22:06   Dg Chest Port 1 View  Result Date: 04/02/2019 CLINICAL DATA:  85 year old male with a history of productive cough EXAM: PORTABLE CHEST 1 VIEW COMPARISON:  April 02, 2019, March 31, 2019 FINDINGS: Cardiomediastinal silhouette unchanged in size and contour. Asymmetric elevation of left hemidiaphragm with interposed gas bubble. Reticular opacities the bilateral lungs, similar to the prior. No new interlobular septal thickening. No large pleural effusion. No pneumothorax. No new confluent airspace  disease. Retrocardiac opacity is persisting with blunting of left costophrenic angle. IMPRESSION: Similar appearance of the chest x-ray with chronic lung changes and no evidence of acute cardiopulmonary disease. Electronically Signed   By: Corrie Mckusick D.O.   On: 04/02/2019 15:42   Dg Chest Port 1 View  Result Date: 04/02/2019 CLINICAL DATA:  85 year old male with fever and cough. EXAM: PORTABLE CHEST 1 VIEW COMPARISON:  Chest radiograph dated 03/31/2019 FINDINGS: Left lung base density, likely atelectasis. Infiltrate is not excluded. Clinical correlation is recommended. There is diffuse interstitial prominence similar prior radiograph. No  new consolidative changes. No large pleural effusion or pneumothorax. Stable cardiac silhouette. Moderate size hiatal hernia. No acute osseous pathology. IMPRESSION: No interval change. Electronically Signed   By: Anner Crete M.D.   On: 04/02/2019 02:25   Dg Chest Portable 1 View  Result Date: 03/31/2019 CLINICAL DATA:  Cough and dyspnea EXAM: PORTABLE CHEST 1 VIEW COMPARISON:  03/08/2018 chest x-ray and abdominal CT FINDINGS: Apparent cardiomegaly is primarily related to a large hiatal hernia based on prior. There is also scarring at the left lower lobe. Interstitial crowding at the bases. No edema, effusion, or pneumothorax. Study is limited by leftward rotation. IMPRESSION: 1. Low volume chest with atelectasis at the bases. 2. Large hiatal hernia with overlying scar/atelectasis limiting assessment of the left base. Electronically Signed   By: Monte Fantasia M.D.   On: 03/31/2019 05:06   US Abdomen Limited Ruq  Result Date: 03/31/2019 CLINICAL DATA:  Right upper quadrant pain.  Elevated LFTs. EXAM: ULTRASOUND ABDOMEN LIMITED RIGHT UPPER QUADRANT COMPARISON:  CT 03/31/2019. FINDINGS: Gallbladder: No gallstones or wall thickening visualized. No sonographic Murphy sign noted by sonographer. Common bile duct: Diameter: 5.4 mm Liver: No focal lesion identified. Within normal limits in parenchymal echogenicity. Portal vein is patent on color Doppler imaging with normal direction of blood flow towards the liver. Other: None. IMPRESSION: No acute abnormality. No gallstones or biliary distention. Liver appears normal. Electronically Signed   By: Marcello Moores  Register   On: 03/31/2019 07:41     CBC Recent Labs  Lab 03/31/19 0408 03/31/19 0076 04/02/19 0133 04/02/19 0516 04/03/19 0306 04/04/19 0815  WBC 4.7  --  2.6* 5.9 7.3 4.8  HGB 11.5* 10.4* 9.7* 9.2* 8.8* 10.0*  HCT 35.6* 31.0* 29.0* 28.0* 26.3* 30.0*  PLT 103*  --  73* 76* 63* 75*  MCV 93.4  --  92.1 93.0 91.0 92.3  MCH 30.2  --  30.8 30.6  30.4 30.8  MCHC 32.3  --  33.4 32.9 33.5 33.3  RDW 14.4  --  14.6 14.7 14.6 14.8  LYMPHSABS 0.2*  --  0.1*  --   --   --   MONOABS 0.1  --  0.0*  --   --   --   EOSABS 0.0  --  0.0  --   --   --   BASOSABS 0.0  --  0.0  --   --   --     Chemistries  Recent Labs  Lab 03/31/19 0408 04/02/19 0133 04/02/19 0516 04/02/19 1852 04/03/19 0306 04/04/19 0815  NA 133* 132* 135  --  135 134*  K 4.3 3.4* 2.9* 4.9 4.5 3.9  CL 100 102 105  --  107 104  CO2 17* 16* 17*  --  18* 18*  GLUCOSE 113* 88 76  --  96 112*  BUN 43* 52* 47*  --  48* 50*  CREATININE 3.75* 3.69* 3.45*  --  3.39*  3.44*  CALCIUM 8.0* 7.4* 7.0*  --  7.3* 8.0*  MG  --   --  1.6*  --  2.3  --   AST 326* 217* 177*  --  133*  --   ALT 230* 214* 189*  --  160*  --   ALKPHOS 242* 244* 228*  --  206*  --   BILITOT 1.9* 4.0* 4.1*  --  4.0*  --    ------------------------------------------------------------------------------------------------------------------ estimated creatinine clearance is 13.4 mL/min (A) (by C-G formula based on SCr of 3.44 mg/dL (H)). ------------------------------------------------------------------------------------------------------------------ No results for input(s): HGBA1C in the last 72 hours. ------------------------------------------------------------------------------------------------------------------ No results for input(s): CHOL, HDL, LDLCALC, TRIG, CHOLHDL, LDLDIRECT in the last 72 hours. ------------------------------------------------------------------------------------------------------------------ No results for input(s): TSH, T4TOTAL, T3FREE, THYROIDAB in the last 72 hours.  Invalid input(s): FREET3 ------------------------------------------------------------------------------------------------------------------ Recent Labs    04/03/19 1552 04/04/19 0815  FOLATE  --  5.9*  FERRITIN 950*  --     Coagulation profile Recent Labs  Lab 03/31/19 0456 04/02/19 0133  INR 1.0 1.0     No results for input(s): DDIMER in the last 72 hours.  Cardiac Enzymes No results for input(s): CKMB, TROPONINI, MYOGLOBIN in the last 168 hours.  Invalid input(s): CK ------------------------------------------------------------------------------------------------------------------ Invalid input(s): [place]. Coli bacteremia- meeting sepsis criteria on admission, but this has resolved. Likely biliary source. Urine culture with no growth. -MRCP 9/25 with possible CBD stone -GI following- plan for ERCP on Monday -ID following -Switch from meropenem to ceftriaxone  Chronically elevated LFTs- likely some underlying cirrhosis. -Treatment as above -GI following- may consider liver biopsy in the future  AKI in CKD IV- baseline creatinine 2.8-3.3 -Monitor -Avoid nephrotoxic agents -Patient does have chronic left-sided hydronephrosis and should follow-up with urology as an outpatient  Chronic normocytic anemia- hemoglobin at baseline. Folate deficiency likely contributing. -Start folic acid 1mg  daily -Monitor  Chronic diastolic congestive heart failure- does not appear volume overloaded. -Last ECHO 03/2018 LVEF 60 to 65% -Continue Imdur -Holding home torsemide with AKI  CAD- stable, no active chest pain. -Continue home aspirin  Hyperlipidemia -Continue home lipitor  Hypertension- BP well-controllled -Continue home metoprolol and doxazosin  Hypothyroidism -Continue Synthroid  Chronic thrombocytopenia- likely due to underlying liver disease -Monitor  Moderate protein-calorie malnutrition -Dietician consult     Code Status Orders  (From admission, onward)         Start     Ordered   04/02/19 0247  Full code  Continuous     04/02/19 0251        Code Status History    Date Active Date Inactive Code Status Order ID Comments User Context   03/08/2018 1125 03/10/2018 2251 Full Code [id number]  Kathi Ludwig, MD Inpatient    07/20/2016 0614 07/23/2016 1828 Full Code [id number]  Harrie Foreman, MD Inpatient   06/25/2016 0615 06/30/2016 1617 Full Code [id number]  Harrie Foreman, MD Inpatient   07/20/2015 1200 07/23/2015 1548 Full Code [id number]  Epifanio Lesches, MD ED   06/03/2015 1517 06/07/2015 1218 Full Code [id number]  Bettey Costa, MD Inpatient   Advance Care Planning Activity      Consults Gastroenterology  DVT Prophylaxis  SCDs due to thrombocytopenia  Lab Results  Component Value Date   PLT 75 (L) 04/04/2019     Time Spent in minutes   40 min  Evette Doffing M.D on 04/04/2019 at 10:58 AM  Between 7am to 6pm - Pager - [phone]  After  6pm go to [email or web address] - Proofreader  [employer]   Office  [phone]

## 2019-04-04 NOTE — Progress Notes (Signed)
Called patient's wife, Rod Holler, and updated her on the plan. There was also another male relative on speaker phone with her. I did not catch his name. All questions answered.  Hyman Bible, MD

## 2019-04-05 DIAGNOSIS — K8309 Other cholangitis: Secondary | ICD-10-CM

## 2019-04-05 LAB — CBC
HCT: 25.8 % — ABNORMAL LOW (ref 39.0–52.0)
Hemoglobin: 8.6 g/dL — ABNORMAL LOW (ref 13.0–17.0)
MCH: 29.8 pg (ref 26.0–34.0)
MCHC: 33.3 g/dL (ref 30.0–36.0)
MCV: 89.3 fL (ref 80.0–100.0)
Platelets: 68 10*3/uL — ABNORMAL LOW (ref 150–400)
RBC: 2.89 MIL/uL — ABNORMAL LOW (ref 4.22–5.81)
RDW: 14.8 % (ref 11.5–15.5)
WBC: 3.6 10*3/uL — ABNORMAL LOW (ref 4.0–10.5)
nRBC: 0 % (ref 0.0–0.2)

## 2019-04-05 LAB — COMPREHENSIVE METABOLIC PANEL
ALT: 86 U/L — ABNORMAL HIGH (ref 0–44)
AST: 68 U/L — ABNORMAL HIGH (ref 15–41)
Albumin: 1.8 g/dL — ABNORMAL LOW (ref 3.5–5.0)
Alkaline Phosphatase: 189 U/L — ABNORMAL HIGH (ref 38–126)
Anion gap: 7 (ref 5–15)
BUN: 50 mg/dL — ABNORMAL HIGH (ref 8–23)
CO2: 20 mmol/L — ABNORMAL LOW (ref 22–32)
Calcium: 7.8 mg/dL — ABNORMAL LOW (ref 8.9–10.3)
Chloride: 107 mmol/L (ref 98–111)
Creatinine, Ser: 3.43 mg/dL — ABNORMAL HIGH (ref 0.61–1.24)
GFR calc Af Amer: 17 mL/min — ABNORMAL LOW (ref >60)
GFR calc non Af Amer: 15 mL/min — ABNORMAL LOW (ref >60)
Glucose, Bld: 86 mg/dL (ref 70–99)
Potassium: 4 mmol/L (ref 3.5–5.1)
Sodium: 134 mmol/L — ABNORMAL LOW (ref 135–145)
Total Bilirubin: 1.3 mg/dL — ABNORMAL HIGH (ref 0.3–1.2)
Total Protein: 4.4 g/dL — ABNORMAL LOW (ref 6.5–8.1)

## 2019-04-05 LAB — MAGNESIUM: Magnesium: 2.3 mg/dL (ref 1.7–2.4)

## 2019-04-05 LAB — AFP TUMOR MARKER: AFP, Serum, Tumor Marker: 2.2 ng/mL (ref 0.0–8.3)

## 2019-04-05 MED ORDER — SODIUM CHLORIDE 0.9 % IV SOLN
2.0000 g | INTRAVENOUS | Status: DC
  Administered 2019-04-05 – 2019-04-07 (×3): 2 g via INTRAVENOUS
  Filled 2019-04-05: qty 2
  Filled 2019-04-05 (×2): qty 20
  Filled 2019-04-05: qty 2

## 2019-04-05 NOTE — Progress Notes (Signed)
[place]                                                                                                                                                                                  Patient Demographics   [name], is a 85 y.o. male, DOB - May 10, 1927, KPT:[id number]  Admit date - 04/02/2019   Admitting Physician Lang Snow, NP  Outpatient Primary MD for the patient is [facility] [place]   LOS - 3  Subjective: Patient states he is doing fine today.  He has no concerns.  He denies any abdominal pain, nausea, vomiting, or diarrhea.  No fevers or chills.  Review of Systems:   CONSTITUTIONAL: No documented fever. No fatigue, weakness. No weight gain, no weight loss.  EYES: No blurry or double vision.  ENT: No tinnitus. No postnasal drip. No redness of the oropharynx.  RESPIRATORY: No cough, no wheeze, no hemoptysis. No dyspnea.  CARDIOVASCULAR: No chest pain. No orthopnea. No palpitations. No syncope.  GASTROINTESTINAL: No nausea, no vomiting or diarrhea. No abdominal pain. No melena or hematochezia.  GENITOURINARY: No dysuria or hematuria.  ENDOCRINE: No polyuria or nocturia. No heat or cold intolerance.  HEMATOLOGY: No anemia. No bruising. No bleeding.  INTEGUMENTARY: No rashes. No lesions.  MUSCULOSKELETAL: No arthritis. No swelling. No gout.  NEUROLOGIC: No numbness, tingling, or ataxia. No seizure-type activity.  PSYCHIATRIC: No anxiety. No insomnia. No ADD.   Vitals:   Vitals:   04/04/19 0510 04/04/19 1209 04/04/19 1951 04/05/19 0555  BP: 121/72 109/67 124/64 (!) 120/54  Pulse: 74 89 95 92  Resp: 20 16 16 18   Temp: 98.9 F (37.2 C) 97.7 F (36.5 C) 98.1 F (36.7 C) 98.4 F (36.9 C)  TempSrc: Oral Axillary Oral Oral  SpO2: 96% 98% 100% 95%  Weight:      Height:        Wt Readings from Last 3 Encounters:  04/02/19 65.3 kg  03/31/19 67 kg  05/28/18 66 kg     Intake/Output Summary (Last 24 hours) at  04/05/2019 1004 Last data filed at 04/05/2019 1700 Gross per 24 hour  Intake 100 ml  Output 800 ml  Net -700 ml    Physical Exam:   GENERAL: Pleasant-appearing in no apparent distress.  Sitting up in bed eating breakfast. HEENT: Atraumatic, normocephalic. Extraocular muscles are intact. Pupils equal and reactive to light. Sclerae anicteric. No conjunctival injection. No oro-pharyngeal erythema.  NECK: Supple. There is no jugular venous distention. No bruits, no lymphadenopathy, no thyromegaly.  HEART: Regular rate and rhythm,. No murmurs, no rubs, no clicks.  LUNGS: Clear to auscultation bilaterally. No rales or rhonchi.  No wheezes.  ABDOMEN: Soft, flat, nontender, nondistended. Has good bowel sounds. No hepatosplenomegaly appreciated.  EXTREMITIES: No evidence of any cyanosis, clubbing, or peripheral edema.  +2 pedal and radial pulses bilaterally.  NEUROLOGIC: The patient is alert, awake, and oriented x3 with no focal motor or sensory deficits appreciated bilaterally.  SKIN: Moist and warm with no rashes appreciated.  Psych: Not anxious, depressed  Antibiotics   Anti-infectives (From admission, onward)   Start     Dose/Rate Route Frequency Ordered Stop   04/04/19 1200  cefTRIAXone (ROCEPHIN) 2 g in sodium chloride 0.9 % 100 mL IVPB     2 g 200 mL/hr over 30 Minutes Intravenous Every 24 hours 04/04/19 0945     04/03/19 1500  vancomycin (VANCOCIN) 500 mg in sodium chloride 0.9 % 100 mL IVPB  Status:  Discontinued     500 mg 100 mL/hr over 60 Minutes Intravenous Every 36 hours 04/02/19 0528 04/02/19 1215   04/03/19 0200  meropenem (MERREM) 500 mg in sodium chloride 0.9 % 100 mL IVPB  Status:  Discontinued     500 mg 200 mL/hr over 30 Minutes Intravenous Every 12 hours 04/02/19 1450 04/04/19 0945   04/02/19 0230  meropenem (MERREM) 1 g in sodium chloride 0.9 % 100 mL IVPB  Status:  Discontinued     1 g 200 mL/hr over 30 Minutes Intravenous Every 12 hours 04/02/19 0218 04/02/19 1450    04/02/19 0230  vancomycin (VANCOCIN) 1,500 mg in sodium chloride 0.9 % 500 mL IVPB     1,500 mg 250 mL/hr over 120 Minutes Intravenous  Once 04/02/19 0219 04/02/19 0508   04/02/19 0215  aztreonam (AZACTAM) 2 g in sodium chloride 0.9 % 100 mL IVPB  Status:  Discontinued     2 g 200 mL/hr over 30 Minutes Intravenous  Once 04/02/19 0201 04/02/19 0218   04/02/19 0215  metroNIDAZOLE (FLAGYL) IVPB 500 mg  Status:  Discontinued     500 mg 100 mL/hr over 60 Minutes Intravenous  Once 04/02/19 0201 04/02/19 0218   04/02/19 0215  vancomycin (VANCOCIN) IVPB 1000 mg/200 mL premix  Status:  Discontinued     1,000 mg 200 mL/hr over 60 Minutes Intravenous  Once 04/02/19 0201 04/02/19 0219      Medications   Scheduled Meds: . aspirin EC  81 mg Oral QHS  . calcitRIOL  0.25 mcg Oral Once per day on Mon Wed Fri  . cholecalciferol  5,000 Units Oral Daily  . doxazosin  4 mg Oral QHS  . feeding supplement (ENSURE ENLIVE)  237 mL Oral BID BM  . ferrous sulfate  325 mg Oral BID  . folic acid  1 mg Oral Daily  . influenza vaccine adjuvanted  0.5 mL Intramuscular Tomorrow-1000  . isosorbide mononitrate  30 mg Oral Daily  . levothyroxine  88 mcg Oral Daily  . multivitamin with minerals  1 tablet Oral Daily  . omega-3 acid ethyl esters  1 g Oral Daily  . pantoprazole  40 mg Oral BID  . sodium bicarbonate  1,300 mg Oral BID  . tamsulosin  0.4 mg Oral QHS   Continuous Infusions: . cefTRIAXone (ROCEPHIN)  IV Stopped (04/04/19 1233)   PRN Meds:.   Data Review:   Micro Results Recent Results (from the past 240 hour(s))  SARS Coronavirus 2 [facility] order, Performed in [facility] hospital lab) Nasopharyngeal Nasopharyngeal Swab     Status: None   Collection Time: 03/31/19  4:09 AM   Specimen: Nasopharyngeal Swab  Result Value Ref Range Status   SARS Coronavirus 2 NEGATIVE NEGATIVE Final    Comment: (NOTE) If result is NEGATIVE SARS-CoV-2 target nucleic acids are NOT DETECTED. The SARS-CoV-2 RNA is  generally detectable in upper and lower  respiratory specimens during the acute phase of infection. The lowest  concentration of SARS-CoV-2 viral copies this assay can detect is 250  copies / mL. A negative result does not preclude SARS-CoV-2 infection  and should not be used as the sole basis for treatment or other  patient management decisions.  A negative result may occur with  improper specimen collection / handling, submission of specimen other  than nasopharyngeal swab, presence of viral mutation(s) within the  areas targeted by this assay, and inadequate number of viral copies  (<250 copies / mL). A negative result must be combined with clinical  observations, patient history, and epidemiological information. If result is POSITIVE SARS-CoV-2 target nucleic acids are DETECTED. The SARS-CoV-2 RNA is generally detectable in upper and lower  respiratory specimens dur ing the acute phase of infection.  Positive  results are indicative of active infection with SARS-CoV-2.  Clinical  correlation with patient history and other diagnostic information is  necessary to determine patient infection status.  Positive results do  not rule out bacterial infection or co-infection with other viruses. If result is PRESUMPTIVE POSTIVE SARS-CoV-2 nucleic acids MAY BE PRESENT.   A presumptive positive result was obtained on the submitted specimen  and confirmed on repeat testing.  While 2019 novel coronavirus  (SARS-CoV-2) nucleic acids may be present in the submitted sample  additional confirmatory testing may be necessary for epidemiological  and / or clinical management purposes  to differentiate between  SARS-CoV-2 and other Sarbecovirus currently known to infect humans.  If clinically indicated additional testing with an alternate test  methodology [phone]) is advised. The SARS-CoV-2 RNA is generally  detectable in upper and lower respiratory sp ecimens during the acute  phase of  infection. The expected result is Negative. Fact Sheet for Patients:  StrictlyIdeas.no Fact Sheet for Healthcare Providers: BankingDealers.co.za This test is not yet approved or cleared by the [place] FDA and has been authorized for detection and/or diagnosis of SARS-CoV-2 by FDA under an Emergency Use Authorization (EUA).  This EUA will remain in effect (meaning this test can be used) for the duration of the COVID-19 declaration under Section 564(b)(1) of the Act, 21 U.S.C. section 360bbb-3(b)(1), unless the authorization is terminated or revoked sooner. Performed at [facility], [address] [place], [place] [address]   Urine culture     Status: None   Collection Time: 03/31/19 10:34 AM   Specimen: Urine, Random  Result Value Ref Range Status   Specimen Description   Final    URINE, RANDOM Performed at [facility], [address], [place], [place] [address]    Special Requests   Final    NONE Performed at [facility], [address], [place], [place] [address]    Culture   Final    NO GROWTH Performed at [facility], [place] [address], [place], [place] [address]    Report Status 04/01/2019 FINAL  Final  Blood Culture (routine x 2)     Status: Abnormal   Collection Time: 04/02/19  1:33 AM   Specimen: BLOOD  Result Value Ref Range Status   Specimen Description   Final    BLOOD LEFT ANTECUBITAL Performed at [facility], [place] [address]  [place], [place] [address]    Special Requests   Final    BOTTLES DRAWN AEROBIC AND ANAEROBIC Blood Culture adequate volume Performed at [facility], [address] [place], [place] [address]    Culture  Setup Time   Final    GRAM NEGATIVE RODS ANAEROBIC BOTTLE ONLY CRITICAL RESULT CALLED TO, READ BACK BY AND VERIFIED WITH: SHEEMA HALLAJI AT 5329 ON 04/02/19 [facility] Performed at [facility], [place] [address] [place], [place]  [address]    Culture ESCHERICHIA COLI (A)  Final   Report Status 04/04/2019 FINAL  Final   Organism ID, Bacteria ESCHERICHIA COLI  Final      Susceptibility   Escherichia coli - MIC*    AMPICILLIN 16 INTERMEDIATE Intermediate     CEFAZOLIN <=4 SENSITIVE Sensitive     CEFEPIME <=1 SENSITIVE Sensitive     CEFTAZIDIME <=1 SENSITIVE Sensitive     CEFTRIAXONE <=1 SENSITIVE Sensitive     CIPROFLOXACIN >=4 RESISTANT Resistant     GENTAMICIN <=1 SENSITIVE Sensitive     IMIPENEM 0.5 SENSITIVE Sensitive     TRIMETH/SULFA <=20 SENSITIVE Sensitive     AMPICILLIN/SULBACTAM 16 INTERMEDIATE Intermediate     PIP/TAZO 8 SENSITIVE Sensitive     Extended ESBL NEGATIVE Sensitive     * ESCHERICHIA COLI  Blood Culture (routine x 2)     Status: Abnormal   Collection Time: 04/02/19  1:33 AM   Specimen: BLOOD  Result Value Ref Range Status   Specimen Description   Final    BLOOD RIGHT ANTECUBITAL Performed at [facility], [place] [address], [place], [place] [address]    Special Requests   Final    BOTTLES DRAWN AEROBIC AND ANAEROBIC Blood Culture adequate volume Performed at [facility], [address] [place], [place] [address]    Culture  Setup Time   Final    GRAM NEGATIVE RODS CRITICAL RESULT CALLED TO, READ BACK BY AND VERIFIED WITH: CRITICAL VALUE NOTED.  VALUE IS CONSISTENT WITH PREVIOUSLY REPORTED AND CALLED VALUE. Performed at [facility], [address] [place], [place] [address]    Culture (A)  Final    ESCHERICHIA COLI SUSCEPTIBILITIES PERFORMED ON PREVIOUS CULTURE WITHIN THE LAST 5 DAYS. Performed at [facility], [place] [address] [place], [place] [address]    Report Status 04/04/2019 FINAL  Final  Urine culture     Status: None   Collection Time: 04/02/19  1:33 AM   Specimen: In/Out Cath Urine  Result Value Ref Range Status   Specimen Description   Final    IN/OUT CATH URINE Performed at [facility], [address], [place], [place]  [address]    Special Requests   Final    NONE Performed at [facility], [address] [place],  [address]    Culture   Final    NO GROWTH Performed at [facility], [place] [address] [place],  [address]    Report Status 04/03/2019 FINAL  Final  Blood Culture ID Panel (Reflexed)     Status: Abnormal   Collection Time: 04/02/19  1:33 AM  Result Value Ref Range Status   Enterococcus species NOT DETECTED NOT DETECTED Final   Listeria monocytogenes NOT DETECTED NOT DETECTED Final   Staphylococcus species NOT DETECTED NOT DETECTED Final   Staphylococcus aureus (BCID) NOT DETECTED NOT DETECTED Final   Streptococcus species NOT DETECTED NOT DETECTED Final   Streptococcus agalactiae NOT DETECTED NOT  DETECTED Final   Streptococcus pneumoniae NOT DETECTED NOT DETECTED Final   Streptococcus pyogenes NOT DETECTED NOT DETECTED Final   Acinetobacter baumannii NOT DETECTED NOT DETECTED Final   Enterobacteriaceae species DETECTED (A) NOT DETECTED Final    Comment: Enterobacteriaceae represent a large family of gram-negative bacteria, not a single organism. CRITICAL RESULT CALLED TO, READ BACK BY AND VERIFIED WITH: SHEEMA HALLAJI AT 5809 ON 04/02/2019 [place].    Enterobacter cloacae complex NOT DETECTED NOT DETECTED Final   Escherichia coli DETECTED (A) NOT DETECTED Final    Comment: CRITICAL RESULT CALLED TO, READ BACK BY AND VERIFIED WITH: SHEEMA HALLAJI AT 9833 ON 04/02/19 [place].    Klebsiella oxytoca NOT DETECTED NOT DETECTED Final   Klebsiella pneumoniae NOT DETECTED NOT DETECTED Final   Proteus species NOT DETECTED NOT DETECTED Final   Serratia marcescens NOT DETECTED NOT DETECTED Final   Carbapenem resistance NOT DETECTED NOT DETECTED Final   Haemophilus influenzae NOT DETECTED NOT DETECTED Final   Neisseria meningitidis NOT DETECTED NOT DETECTED Final   Pseudomonas aeruginosa NOT DETECTED NOT DETECTED Final   Candida albicans NOT DETECTED NOT DETECTED Final    Candida glabrata NOT DETECTED NOT DETECTED Final   Candida krusei NOT DETECTED NOT DETECTED Final   Candida parapsilosis NOT DETECTED NOT DETECTED Final   Candida tropicalis NOT DETECTED NOT DETECTED Final    Comment: Performed at [facility], [address] [place], [place] [address]    Radiology Reports Ct Abdomen Pelvis Wo Contrast  Result Date: 03/31/2019 CLINICAL DATA:  Acute generalized abdominal pain. EXAM: CT ABDOMEN AND PELVIS WITHOUT CONTRAST TECHNIQUE: Multidetector CT imaging of the abdomen and pelvis was performed following the standard protocol without IV contrast. COMPARISON:  03/08/2018 FINDINGS: Lower chest: Sizable hiatal hernia. Chronic trace pleural fluid at the left base. Coronary calcification. Hepatobiliary: No focal liver abnormality.No evidence of biliary obstruction or stone. Pancreas: Generalized atrophy. Spleen: Unremarkable. Adrenals/Urinary Tract: Negative adrenals. Chronic left hydroureteronephrosis due to distal ureteral calculus with severe left renal atrophy. The left kidney is likely nonfunctional. 15 mm left interpolar calculus. No right hydronephrosis. Thick walled bladder with subtle cellule/diverticulum. Mild perivesicular fat haziness. Stomach/Bowel:  No obstruction. Left colonic diverticulosis. Vascular/Lymphatic: No acute vascular abnormality. No mass or adenopathy. Haziness of fat in the sigmoid mesentery without mass or adenopathy-consistent with chronic mesenteric panniculitis. Reproductive:No acute finding Other: No ascites or pneumoperitoneum. Musculoskeletal: No acute abnormalities. Osteopenia and spinal degeneration IMPRESSION: 1. Possible cystitis. 2. Finding suggesting chronic bladder outlet obstruction. There is only mild bladder distention currently. 3. 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 4. Large hiatal hernia. Electronically Signed   By: Monte Fantasia M.D.   On: 03/31/2019 06:24   Mr Abdomen Mrcp Wo  Contrast  Result Date: 04/03/2019 CLINICAL DATA:  Jaundice. Abdominal pain, fever. Chronic renal disease. EXAM: MRI ABDOMEN WITHOUT CONTRAST  (INCLUDING MRCP) TECHNIQUE: Multiplanar multisequence MR imaging of the abdomen was performed. Heavily T2-weighted images of the biliary and pancreatic ducts were obtained, and three-dimensional MRCP images were rendered by post processing. COMPARISON:  CT March 31, 2019 FINDINGS: Lower chest: Small bilateral pleural effusions. Large hiatal hernia with at least half the stomach within LEFT hemithorax posterior the heart. Hepatobiliary: No intrahepatic biliary duct dilatation. The common hepatic duct is normal caliber at 7 mm. The common bile duct upper limits normal caliber. In the distal common bile duct there is a potential filling defect (image 5/14 MRCP sequence; and image 20/3 of T2 weighted series). This is  inconclusive and there is significant motion artifact. No gallstones within the gallbladder which weighs against choledocholithiasis. Gallbladder is not distended. No pericholecystic fluid. Pancreas: No pancreatic inflammation identified. No duct dilatation. 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 One cystic lesion in tail the pancreas measures 7 mm. Spleen: Several small lesions in the spleen likely represent benign cysts. Adrenals/urinary tract: Adrenal glands normal. Chronic hydronephrosis of the LEFT kidney with severe cortical atrophy. Stomach/Bowel: Large hiatal hernia. No abnormality of the limited view small-bowel and colon. Vascular/Lymphatic: Abdominal aortic normal caliber. No retroperitoneal periportal lymphadenopathy. Musculoskeletal: No aggressive osseous lesion IMPRESSION: 1. Potential distal common bile duct stone (choledocholithiasis). Significant motion degradation. Equivocal exam. 2. Minimal intra or extrahepatic biliary duct dilatation. 3. No evidence of acute cholecystitis. No evidence of cholelithiasis. 4. Large hiatal hernia.  Chronic LEFT hydronephrosis and cortical atrophy. 5. 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 Lesions of this size do not require follow-up in this age group. This recommendation follows ACR consensus guidelines: Management of Incidental Pancreatic Cysts: A White Paper of the ACR Incidental Findings Committee. [place] 1027;25:366-440. Electronically Signed   By: Suzy Bouchard M.D.   On: 04/03/2019 22:06   Mr 3d Recon At Scanner  Result Date: 04/03/2019 CLINICAL DATA:  Jaundice. Abdominal pain, fever. Chronic renal disease. EXAM: MRI ABDOMEN WITHOUT CONTRAST  (INCLUDING MRCP) TECHNIQUE: Multiplanar multisequence MR imaging of the abdomen was performed. Heavily T2-weighted images of the biliary and pancreatic ducts were obtained, and three-dimensional MRCP images were rendered by post processing. COMPARISON:  CT March 31, 2019 FINDINGS: Lower chest: Small bilateral pleural effusions. Large hiatal hernia with at least half the stomach within LEFT hemithorax posterior the heart. Hepatobiliary: No intrahepatic biliary duct dilatation. The common hepatic duct is normal caliber at 7 mm. The common bile duct upper limits normal caliber. In the distal common bile duct there is a potential filling defect (image 5/14 MRCP sequence; and image 20/3 of T2 weighted series). This is inconclusive and there is significant motion artifact. No gallstones within the gallbladder which weighs against choledocholithiasis. Gallbladder is not distended. No pericholecystic fluid. Pancreas: No pancreatic inflammation identified. No duct dilatation. 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 One cystic lesion in tail the pancreas measures 7 mm. Spleen: Several small lesions in the spleen likely represent benign cysts. Adrenals/urinary tract: Adrenal glands normal. Chronic hydronephrosis of the LEFT kidney with severe cortical atrophy. Stomach/Bowel: Large hiatal hernia. No abnormality of the  limited view small-bowel and colon. Vascular/Lymphatic: Abdominal aortic normal caliber. No retroperitoneal periportal lymphadenopathy. Musculoskeletal: No aggressive osseous lesion IMPRESSION: 1. Potential distal common bile duct stone (choledocholithiasis). Significant motion degradation. Equivocal exam. 2. Minimal intra or extrahepatic biliary duct dilatation. 3. No evidence of acute cholecystitis. No evidence of cholelithiasis. 4. Large hiatal hernia. Chronic LEFT hydronephrosis and cortical atrophy. 5. 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 Lesions of this size do not require follow-up in this age group. This recommendation follows ACR consensus guidelines: Management of Incidental Pancreatic Cysts: A White Paper of the ACR Incidental Findings Committee. [place] 3474;25:[id number]. Electronically Signed   By: Suzy Bouchard M.D.   On: 04/03/2019 22:06   Dg Chest Port 1 View  Result Date: 04/02/2019 CLINICAL DATA:  85 year old male with a history of productive cough EXAM: PORTABLE CHEST 1 VIEW COMPARISON:  April 02, 2019, March 31, 2019 FINDINGS: Cardiomediastinal silhouette unchanged in size and contour. Asymmetric elevation of left hemidiaphragm with interposed  gas bubble. Reticular opacities the bilateral lungs, similar to the prior. No new interlobular septal thickening. No large pleural effusion. No pneumothorax. No new confluent airspace disease. Retrocardiac opacity is persisting with blunting of left costophrenic angle. IMPRESSION: Similar appearance of the chest x-ray with chronic lung changes and no evidence of acute cardiopulmonary disease. Electronically Signed   By: Corrie Mckusick D.O.   On: 04/02/2019 15:42   Dg Chest Port 1 View  Result Date: 04/02/2019 CLINICAL DATA:  85 year old male with fever and cough. EXAM: PORTABLE CHEST 1 VIEW COMPARISON:  Chest radiograph dated 03/31/2019 FINDINGS: Left lung base density, likely atelectasis. Infiltrate is  not excluded. Clinical correlation is recommended. There is diffuse interstitial prominence similar prior radiograph. No new consolidative changes. No large pleural effusion or pneumothorax. Stable cardiac silhouette. Moderate size hiatal hernia. No acute osseous pathology. IMPRESSION: No interval change. Electronically Signed   By: Anner Crete M.D.   On: 04/02/2019 02:25   Dg Chest Portable 1 View  Result Date: 03/31/2019 CLINICAL DATA:  Cough and dyspnea EXAM: PORTABLE CHEST 1 VIEW COMPARISON:  03/08/2018 chest x-ray and abdominal CT FINDINGS: Apparent cardiomegaly is primarily related to a large hiatal hernia based on prior. There is also scarring at the left lower lobe. Interstitial crowding at the bases. No edema, effusion, or pneumothorax. Study is limited by leftward rotation. IMPRESSION: 1. Low volume chest with atelectasis at the bases. 2. Large hiatal hernia with overlying scar/atelectasis limiting assessment of the left base. Electronically Signed   By: Monte Fantasia M.D.   On: 03/31/2019 05:06   US Abdomen Limited Ruq  Result Date: 03/31/2019 CLINICAL DATA:  Right upper quadrant pain.  Elevated LFTs. EXAM: ULTRASOUND ABDOMEN LIMITED RIGHT UPPER QUADRANT COMPARISON:  CT 03/31/2019. FINDINGS: Gallbladder: No gallstones or wall thickening visualized. No sonographic Murphy sign noted by sonographer. Common bile duct: Diameter: 5.4 mm Liver: No focal lesion identified. Within normal limits in parenchymal echogenicity. Portal vein is patent on color Doppler imaging with normal direction of blood flow towards the liver. Other: None. IMPRESSION: No acute abnormality. No gallstones or biliary distention. Liver appears normal. Electronically Signed   By: Marcello Moores  Register   On: 03/31/2019 07:41     CBC Recent Labs  Lab 03/31/19 0408  04/02/19 0133 04/02/19 0516 04/03/19 0306 04/04/19 0815 04/05/19 0439  WBC 4.7  --  2.6* 5.9 7.3 4.8 3.6*  HGB 11.5*   < > 9.7* 9.2* 8.8* 10.0* 8.6*  HCT  35.6*   < > 29.0* 28.0* 26.3* 30.0* 25.8*  PLT 103*  --  73* 76* 63* 75* 68*  MCV 93.4  --  92.1 93.0 91.0 92.3 89.3  MCH 30.2  --  30.8 30.6 30.4 30.8 29.8  MCHC 32.3  --  33.4 32.9 33.5 33.3 33.3  RDW 14.4  --  14.6 14.7 14.6 14.8 14.8  LYMPHSABS 0.2*  --  0.1*  --   --   --   --   MONOABS 0.1  --  0.0*  --   --   --   --   EOSABS 0.0  --  0.0  --   --   --   --   BASOSABS 0.0  --  0.0  --   --   --   --    < > = values in this interval not displayed.    [place]  Lab 03/31/19 0408 04/02/19 0133 04/02/19 3335 04/02/19 1852 04/03/19 0306 04/04/19 0815 04/05/19 4562  NA 133* 132* 135  --  135 134* 134*  K 4.3 3.4* 2.9* 4.9 4.5 3.9 4.0  CL 100 102 105  --  107 104 107  CO2 17* 16* 17*  --  18* 18* 20*  GLUCOSE 113* 88 76  --  96 112* 86  BUN 43* 52* 47*  --  48* 50* 50*  CREATININE 3.75* 3.69* 3.45*  --  3.39* 3.44* 3.43*  CALCIUM 8.0* 7.4* 7.0*  --  7.3* 8.0* 7.8*  MG  --   --  1.6*  --  2.3  --  2.3  AST 326* 217* 177*  --  133*  --  68*  ALT 230* 214* 189*  --  160*  --  86*  ALKPHOS 242* 244* 228*  --  206*  --  189*  BILITOT 1.9* 4.0* 4.1*  --  4.0*  --  1.3*   ------------------------------------------------------------------------------------------------------------------ estimated creatinine clearance is 13.5 mL/min (A) (by C-G formula based on SCr of 3.43 mg/dL (H)). ------------------------------------------------------------------------------------------------------------------ No results for input(s): HGBA1C in the last 72 hours. ------------------------------------------------------------------------------------------------------------------ No results for input(s): CHOL, HDL, LDLCALC, TRIG, CHOLHDL, LDLDIRECT in the last 72 hours. ------------------------------------------------------------------------------------------------------------------ No results for input(s): TSH, T4TOTAL, T3FREE, THYROIDAB in the last 72 hours.  Invalid input(s):  FREET3 ------------------------------------------------------------------------------------------------------------------ Recent Labs    04/03/19 1552 04/04/19 0815  VITAMINB12  --  391  FOLATE  --  5.9*  FERRITIN 950*  --     Coagulation profile Recent Labs  Lab 03/31/19 0456 04/02/19 0133  INR 1.0 1.0    No results for input(s): DDIMER in the last 72 hours.  Cardiac Enzymes No results for input(s): CKMB, TROPONINI, MYOGLOBIN in the last 168 hours.  Invalid input(s): CK ------------------------------------------------------------------------------------------------------------------ Invalid input(s): [place]. Coli bacteremia- meeting sepsis criteria on admission, but this has resolved. Likely biliary source. Urine culture with no growth. -MRCP 9/25 with possible CBD stone -Plan for ERCP tomorrow- NPO at midnight -GI following -ID following -Continue ceftriaxone  Elevated LFTs- likely some underlying cirrhosis with possible CBD stone. LFTs and bilirubin improving. -Treatment as above -GI following- may consider liver biopsy in the future  AKI in CKD IV- improved. Baseline creatinine 2.8-3.3. -Monitor -Avoid nephrotoxic agents -Patient does have chronic left-sided hydronephrosis and should follow-up with urology as an outpatient  Chronic normocytic anemia- hemoglobin at baseline. Folate deficiency likely contributing. -Continue folic acid 1mg  daily -Monitor  Chronic diastolic congestive heart failure- does not appear volume overloaded. -Last ECHO 03/2018 LVEF 60 to 65% -Continue Imdur -Holding home torsemide with AKI  CAD- stable, no active chest pain. -Continue home aspirin  Hyperlipidemia -Continue home lipitor  Hypertension- BP well-controllled -Continue home metoprolol and doxazosin  Hypothyroidism -Continue Synthroid  Chronic thrombocytopenia- likely due to underlying liver disease -Monitor  Moderate  protein-calorie malnutrition -Dietician consult     Code Status Orders  (From admission, onward)         Start     Ordered   04/02/19 0247  Full code  Continuous     04/02/19 0251        Code Status History    Date Active Date Inactive Code Status Order ID Comments User Context   03/08/2018 1125 03/10/2018 2251 Full Code [id number]  Kathi Ludwig, MD Inpatient   07/20/2016 0614 07/23/2016 1828 Full Code [id number]  Harrie Foreman, MD Inpatient   06/25/2016 0615 06/30/2016 1617 Full Code [id number]  Harrie Foreman, MD Inpatient   07/20/2015  1200 07/23/2015 1548 Full Code [id number]  Epifanio Lesches, MD ED   06/03/2015 1517 06/07/2015 1218 Full Code [id number]  Bettey Costa, MD Inpatient   Advance Care Planning Activity      Consults Gastroenterology, ID  DVT Prophylaxis  SCDs due to thrombocytopenia  Lab Results  Component Value Date   PLT 68 (L) 04/05/2019     Time Spent in minutes   38 min  Berna Spare Mayo M.D on 04/05/2019 at 10:04 AM  Between 7am to 6pm - Pager - [phone]  After 6pm go to [email or web address] - Proofreader  [employer]   Office  [phone]

## 2019-04-05 NOTE — Consult Note (Signed)
PHARMACY CONSULT NOTE - FOLLOW UP  Pharmacy Consult for Electrolyte Monitoring and Replacement   Recent Labs: Potassium (mmol/L)  Date Value  04/05/2019 4.0  03/07/2012 3.4 (L)   Magnesium (mg/dL)  Date Value  04/05/2019 2.3  03/06/2012 1.9   Calcium (mg/dL)  Date Value  04/05/2019 7.8 (L)   Calcium, Total (mg/dL)  Date Value  03/07/2012 8.2 (L)   Albumin (g/dL)  Date Value  04/05/2019 1.8 (L)  03/05/2012 3.5   Phosphorus (mg/dL)  Date Value  04/03/2019 4.5   Sodium (mmol/L)  Date Value  04/05/2019 134 (L)  03/07/2012 142    Assessment: 85 y.o. male with pertinent past medical history of CAD, chronic diastolic congestive heart failure, hyperlipidemia, hypertension, prostate cancer, CKD stage IV, chronic left hydronephrosis, and chronic elevated LFTs presenting to the ED with chief complaints of fever and abdominal pain. Has sodium bicarb 1300mg  bid ordered.  Pharmacy has been consulted to monitor and replenish electrolytes.  Goal of Therapy:  Electrolytes wnl's  Plan:  K 4.0  Mag 2.3 Scr 3.43 No electrolyte supplementation warranted at this time  LAbs appear stable- Will f/u electrolytes in 2 days- on 9/29  Noralee Space, PharmD, BCPS Clinical Pharmacist 04/05/2019 8:14 AM

## 2019-04-05 NOTE — Progress Notes (Signed)
[name] , MD [address], [place], [place], [place], [address] [address] [place], [place], [place], [address] Phone: [phone]  Fax: [phone]   [name] is being followed for biliary obstruction/Cholangitis/e coli bacteremia  Day 1 of follow up   Subjective: Feels well , no abdominal pain or no fever    Objective: Vital signs in last 24 hours: Vitals:   04/04/19 0510 04/04/19 1209 04/04/19 1951 04/05/19 0555  BP: 121/72 109/67 124/64 (!) 120/54  Pulse: 74 89 95 92  Resp: 20 16 16 18   Temp: 98.9 F (37.2 C) 97.7 F (36.5 C) 98.1 F (36.7 C) 98.4 F (36.9 C)  TempSrc: Oral Axillary Oral Oral  SpO2: 96% 98% 100% 95%  Weight:      Height:       Weight change:   Intake/Output Summary (Last 24 hours) at 04/05/2019 1127 Last data filed at 04/05/2019 1009 Gross per 24 hour  Intake 700 ml  Output 800 ml  Net -100 ml     Exam: Heart:: Regular rate and rhythm, S1S2 present or without murmur or extra heart sounds Lungs: normal, clear to auscultation and clear to auscultation and percussion Abdomen: soft, nontender, normal bowel sounds   Lab Results: @LABTEST2 @ Micro Results: Recent Results (from the past 240 hour(s))  SARS Coronavirus 2 [facility] order, Performed in [facility] hospital lab) Nasopharyngeal Nasopharyngeal Swab     Status: None   Collection Time: 03/31/19  4:09 AM   Specimen: Nasopharyngeal Swab  Result Value Ref Range Status   SARS Coronavirus 2 NEGATIVE NEGATIVE Final    Comment: (NOTE) If result is NEGATIVE SARS-CoV-2 target nucleic acids are NOT DETECTED. The SARS-CoV-2 RNA is generally detectable in upper and lower  respiratory specimens during the acute phase of infection. The lowest  concentration of SARS-CoV-2 viral copies this assay can detect is 250  copies / mL. A negative result does not preclude SARS-CoV-2 infection  and should not be used as the sole basis for treatment or other  patient management decisions.  A  negative result may occur with  improper specimen collection / handling, submission of specimen other  than nasopharyngeal swab, presence of viral mutation(s) within the  areas targeted by this assay, and inadequate number of viral copies  (<250 copies / mL). A negative result must be combined with clinical  observations, patient history, and epidemiological information. If result is POSITIVE SARS-CoV-2 target nucleic acids are DETECTED. The SARS-CoV-2 RNA is generally detectable in upper and lower  respiratory specimens dur ing the acute phase of infection.  Positive  results are indicative of active infection with SARS-CoV-2.  Clinical  correlation with patient history and other diagnostic information is  necessary to determine patient infection status.  Positive results do  not rule out bacterial infection or co-infection with other viruses. If result is PRESUMPTIVE POSTIVE SARS-CoV-2 nucleic acids MAY BE PRESENT.   A presumptive positive result was obtained on the submitted specimen  and confirmed on repeat testing.  While 2019 novel coronavirus  (SARS-CoV-2) nucleic acids may be present in the submitted sample  additional confirmatory testing may be necessary for epidemiological  and / or clinical management purposes  to differentiate between  SARS-CoV-2 and other Sarbecovirus currently known to infect humans.  If clinically indicated additional testing with an alternate test  methodology [phone]) is advised. The SARS-CoV-2 RNA is generally  detectable in upper and lower respiratory sp ecimens during the acute  phase of infection. The  expected result is Negative. Fact Sheet for Patients:  StrictlyIdeas.no Fact Sheet for Healthcare Providers: BankingDealers.co.za This test is not yet approved or cleared by the [place] FDA and has been authorized for detection and/or diagnosis of SARS-CoV-2 by FDA under an Emergency Use  Authorization (EUA).  This EUA will remain in effect (meaning this test can be used) for the duration of the COVID-19 declaration under Section 564(b)(1) of the Act, 21 U.S.C. section 360bbb-3(b)(1), unless the authorization is terminated or revoked sooner. Performed at [facility], [address] [place], [place] [address]   Urine culture     Status: None   Collection Time: 03/31/19 10:34 AM   Specimen: Urine, Random  Result Value Ref Range Status   Specimen Description   Final    URINE, RANDOM Performed at [facility], [address] [place], [place] [address]    Special Requests   Final    NONE Performed at [facility], [address] [place], [place] [address]    Culture   Final    NO GROWTH Performed at [facility], [place] [address], [place], [place] [address]    Report Status 04/01/2019 FINAL  Final  Blood Culture (routine x 2)     Status: Abnormal   Collection Time: 04/02/19  1:33 AM   Specimen: BLOOD  Result Value Ref Range Status   Specimen Description   Final    BLOOD LEFT ANTECUBITAL Performed at [facility], [place] [address], [place], [place] [address]    Special Requests   Final    BOTTLES DRAWN AEROBIC AND ANAEROBIC Blood Culture adequate volume Performed at [place] [place], [place] [address]    Culture  Setup Time   Final    GRAM NEGATIVE RODS ANAEROBIC BOTTLE ONLY CRITICAL RESULT CALLED TO, READ BACK BY AND VERIFIED WITH: SHEEMA HALLAJI AT 6629 ON 04/02/19 [facility] Performed at [facility], [place] [address] [place], [place] [address]    Culture ESCHERICHIA COLI (A)  Final   Report Status 04/04/2019 FINAL  Final   Organism ID, Bacteria ESCHERICHIA COLI  Final      Susceptibility   Escherichia coli - MIC*    AMPICILLIN 16 INTERMEDIATE Intermediate     CEFAZOLIN <=4 SENSITIVE Sensitive     CEFEPIME <=1 SENSITIVE Sensitive     CEFTAZIDIME <=1 SENSITIVE Sensitive      CEFTRIAXONE <=1 SENSITIVE Sensitive     CIPROFLOXACIN >=4 RESISTANT Resistant     GENTAMICIN <=1 SENSITIVE Sensitive     IMIPENEM 0.5 SENSITIVE Sensitive     TRIMETH/SULFA <=20 SENSITIVE Sensitive     AMPICILLIN/SULBACTAM 16 INTERMEDIATE Intermediate     PIP/TAZO 8 SENSITIVE Sensitive     Extended ESBL NEGATIVE Sensitive     * ESCHERICHIA COLI  Blood Culture (routine x 2)     Status: Abnormal   Collection Time: 04/02/19  1:33 AM   Specimen: BLOOD  Result Value Ref Range Status   Specimen Description   Final    BLOOD RIGHT ANTECUBITAL Performed at [facility], [place] [address] [place], [place] [address]    Special Requests   Final    BOTTLES DRAWN AEROBIC AND ANAEROBIC Blood Culture adequate volume Performed at [place] [place], [place] [address]    Culture  Setup Time   Final    GRAM NEGATIVE RODS CRITICAL RESULT CALLED TO, READ BACK BY AND VERIFIED WITH:  CRITICAL VALUE NOTED.  VALUE IS CONSISTENT WITH PREVIOUSLY REPORTED AND CALLED VALUE. Performed at [facility], [address] [place], [place] [address]    Culture (A)  Final    ESCHERICHIA COLI SUSCEPTIBILITIES PERFORMED ON PREVIOUS CULTURE WITHIN THE LAST 5 DAYS. Performed at [facility], [place] [address] [place], [place] [address]    Report Status 04/04/2019 FINAL  Final  Urine culture     Status: None   Collection Time: 04/02/19  1:33 AM   Specimen: In/Out Cath Urine  Result Value Ref Range Status   Specimen Description   Final    IN/OUT CATH URINE Performed at [facility], [address], [place], [place] [address]    Special Requests   Final    NONE Performed at [facility], [address], [place], [place] [address]    Culture   Final    NO GROWTH Performed at [facility], [place] [address] [place], [place] [address]    Report Status 04/03/2019 FINAL  Final  Blood Culture ID Panel (Reflexed)     Status: Abnormal   Collection  Time: 04/02/19  1:33 AM  Result Value Ref Range Status   Enterococcus species NOT DETECTED NOT DETECTED Final   Listeria monocytogenes NOT DETECTED NOT DETECTED Final   Staphylococcus species NOT DETECTED NOT DETECTED Final   Staphylococcus aureus (BCID) NOT DETECTED NOT DETECTED Final   Streptococcus species NOT DETECTED NOT DETECTED Final   Streptococcus agalactiae NOT DETECTED NOT DETECTED Final   Streptococcus pneumoniae NOT DETECTED NOT DETECTED Final   Streptococcus pyogenes NOT DETECTED NOT DETECTED Final   Acinetobacter baumannii NOT DETECTED NOT DETECTED Final   Enterobacteriaceae species DETECTED (A) NOT DETECTED Final    Comment: Enterobacteriaceae represent a large family of gram-negative bacteria, not a single organism. CRITICAL RESULT CALLED TO, READ BACK BY AND VERIFIED WITH: SHEEMA HALLAJI AT 8657 ON 04/02/2019 [place].    Enterobacter cloacae complex NOT DETECTED NOT DETECTED Final   Escherichia coli DETECTED (A) NOT DETECTED Final    Comment: CRITICAL RESULT CALLED TO, READ BACK BY AND VERIFIED WITH: SHEEMA HALLAJI AT 8469 ON 04/02/19 [place].    Klebsiella oxytoca NOT DETECTED NOT DETECTED Final   Klebsiella pneumoniae NOT DETECTED NOT DETECTED Final   Proteus species NOT DETECTED NOT DETECTED Final   Serratia marcescens NOT DETECTED NOT DETECTED Final   Carbapenem resistance NOT DETECTED NOT DETECTED Final   Haemophilus influenzae NOT DETECTED NOT DETECTED Final   Neisseria meningitidis NOT DETECTED NOT DETECTED Final   Pseudomonas aeruginosa NOT DETECTED NOT DETECTED Final   Candida albicans NOT DETECTED NOT DETECTED Final   Candida glabrata NOT DETECTED NOT DETECTED Final   Candida krusei NOT DETECTED NOT DETECTED Final   Candida parapsilosis NOT DETECTED NOT DETECTED Final   Candida tropicalis NOT DETECTED NOT DETECTED Final    Comment: Performed at [facility], [address] [place], [place] [address]   Studies/Results: Mr Abdomen Mrcp Wo  Contrast  Result Date: 04/03/2019 CLINICAL DATA:  Jaundice. Abdominal pain, fever. Chronic renal disease. EXAM: MRI ABDOMEN WITHOUT CONTRAST  (INCLUDING MRCP) TECHNIQUE: Multiplanar multisequence MR imaging of the abdomen was performed. Heavily T2-weighted images of the biliary and pancreatic ducts were obtained, and three-dimensional MRCP images were rendered by post processing. COMPARISON:  CT March 31, 2019 FINDINGS: Lower chest: Small bilateral pleural effusions. Large hiatal hernia with at least half the stomach within LEFT hemithorax posterior the heart. Hepatobiliary: No intrahepatic biliary  duct dilatation. The common hepatic duct is normal caliber at 7 mm. The common bile duct upper limits normal caliber. In the distal common bile duct there is a potential filling defect (image 5/14 MRCP sequence; and image 20/3 of T2 weighted series). This is inconclusive and there is significant motion artifact. No gallstones within the gallbladder which weighs against choledocholithiasis. Gallbladder is not distended. No pericholecystic fluid. Pancreas: No pancreatic inflammation identified. No duct dilatation. 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 One cystic lesion in tail the pancreas measures 7 mm. Spleen: Several small lesions in the spleen likely represent benign cysts. Adrenals/urinary tract: Adrenal glands normal. Chronic hydronephrosis of the LEFT kidney with severe cortical atrophy. Stomach/Bowel: Large hiatal hernia. No abnormality of the limited view small-bowel and colon. Vascular/Lymphatic: Abdominal aortic normal caliber. No retroperitoneal periportal lymphadenopathy. Musculoskeletal: No aggressive osseous lesion IMPRESSION: 1. Potential distal common bile duct stone (choledocholithiasis). Significant motion degradation. Equivocal exam. 2. Minimal intra or extrahepatic biliary duct dilatation. 3. No evidence of acute cholecystitis. No evidence of cholelithiasis. 4. Large hiatal hernia.  Chronic LEFT hydronephrosis and cortical atrophy. 5. 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 Lesions of this size do not require follow-up in this age group. This recommendation follows ACR consensus guidelines: Management of Incidental Pancreatic Cysts: A White Paper of the ACR Incidental Findings Committee. [place] 3007;62:[id number]. Electronically Signed   By: Suzy Bouchard M.D.   On: 04/03/2019 22:06   Mr 3d Recon At Scanner  Result Date: 04/03/2019 CLINICAL DATA:  Jaundice. Abdominal pain, fever. Chronic renal disease. EXAM: MRI ABDOMEN WITHOUT CONTRAST  (INCLUDING MRCP) TECHNIQUE: Multiplanar multisequence MR imaging of the abdomen was performed. Heavily T2-weighted images of the biliary and pancreatic ducts were obtained, and three-dimensional MRCP images were rendered by post processing. COMPARISON:  CT March 31, 2019 FINDINGS: Lower chest: Small bilateral pleural effusions. Large hiatal hernia with at least half the stomach within LEFT hemithorax posterior the heart. Hepatobiliary: No intrahepatic biliary duct dilatation. The common hepatic duct is normal caliber at 7 mm. The common bile duct upper limits normal caliber. In the distal common bile duct there is a potential filling defect (image 5/14 MRCP sequence; and image 20/3 of T2 weighted series). This is inconclusive and there is significant motion artifact. No gallstones within the gallbladder which weighs against choledocholithiasis. Gallbladder is not distended. No pericholecystic fluid. Pancreas: No pancreatic inflammation identified. No duct dilatation. 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 One cystic lesion in tail the pancreas measures 7 mm. Spleen: Several small lesions in the spleen likely represent benign cysts. Adrenals/urinary tract: Adrenal glands normal. Chronic hydronephrosis of the LEFT kidney with severe cortical atrophy. Stomach/Bowel: Large hiatal hernia. No abnormality of the  limited view small-bowel and colon. Vascular/Lymphatic: Abdominal aortic normal caliber. No retroperitoneal periportal lymphadenopathy. Musculoskeletal: No aggressive osseous lesion IMPRESSION: 1. Potential distal common bile duct stone (choledocholithiasis). Significant motion degradation. Equivocal exam. 2. Minimal intra or extrahepatic biliary duct dilatation. 3. No evidence of acute cholecystitis. No evidence of cholelithiasis. 4. Large hiatal hernia. Chronic LEFT hydronephrosis and cortical atrophy. 5. 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 Lesions of this size do not require follow-up in this age group. This recommendation follows ACR consensus guidelines: Management of Incidental Pancreatic Cysts: A White Paper of the ACR Incidental Findings Committee. [place] 4562;56:389-373. Electronically Signed   By: Suzy Bouchard M.D.   On: 04/03/2019 22:06   Medications: I have  reviewed the patient's current medications. Scheduled Meds:  aspirin EC  81 mg Oral QHS   calcitRIOL  0.25 mcg Oral Once per day on Mon Wed Fri   cholecalciferol  5,000 Units Oral Daily   doxazosin  4 mg Oral QHS   feeding supplement (ENSURE ENLIVE)  237 mL Oral BID BM   ferrous sulfate  325 mg Oral BID   folic acid  1 mg Oral Daily   influenza vaccine adjuvanted  0.5 mL Intramuscular Tomorrow-1000   isosorbide mononitrate  30 mg Oral Daily   levothyroxine  88 mcg Oral Daily   multivitamin with minerals  1 tablet Oral Daily   omega-3 acid ethyl esters  1 g Oral Daily   pantoprazole  40 mg Oral BID   sodium bicarbonate  1,300 mg Oral BID   tamsulosin  0.4 mg Oral QHS   Continuous Infusions:  cefTRIAXone (ROCEPHIN)  IV     PRN Meds:.acetaminophen   Assessment: Active Problems:   Sepsis ([place])   Malnutrition of moderate degree  [name] 85 y.o. male admitted on 04/02/2019 for fever and abdominal pain.He has a history of chronic diastolic heart failure.  Had  leukopenia, lactic acidosis, was febrile.  Commenced on empiric antibiotics.    GI was consulted on 04/03/2019 for elevated LFTs(bilirubin down today ).  Has been elevated for a while since April 2018.  History of excess alcohol consumption.  Bilirubin has been chronically elevated which is not new.  PTH is elevated and he may have hyperparathyroidism leading to elevated alkaline phosphatase.  MRCP showed potential distal common bile duct stone.  Equivocal exam.  Minimal intra-or extrahepatic biliary dilation.  No evidence of cholelithiasis.  Plan: 1.    Abnormal MRCP possible distal obstruction. Discussed with Dr Allen Norris - plan for ERCP Monday morning .  It Is possible that this obstruction may be causing cholangitis.  Other differential is Spinchter of Oddi  Dysfunction Type 1 2. Continue antibiotics     LOS: 3 days   Kirk Bellows, MD 04/05/2019, 11:27 AM

## 2019-04-06 ENCOUNTER — Encounter
Admission: EM | Disposition: A | Discharge status: Skilled Nursing Facility | Payer: Self-pay | Source: Home / Self Care | Attending: Internal Medicine

## 2019-04-06 ENCOUNTER — Inpatient Hospital Stay: Payer: Medicare Other | Admitting: Anesthesiology

## 2019-04-06 ENCOUNTER — Inpatient Hospital Stay: Payer: Medicare Other

## 2019-04-06 ENCOUNTER — Encounter: Payer: Self-pay | Admitting: Emergency Medicine

## 2019-04-06 DIAGNOSIS — R748 Abnormal levels of other serum enzymes: Secondary | ICD-10-CM

## 2019-04-06 DIAGNOSIS — K8050 Calculus of bile duct without cholangitis or cholecystitis without obstruction: Secondary | ICD-10-CM

## 2019-04-06 DIAGNOSIS — K222 Esophageal obstruction: Secondary | ICD-10-CM

## 2019-04-06 HISTORY — PX: ENDOSCOPIC RETROGRADE CHOLANGIOPANCREATOGRAPHY (ERCP) WITH PROPOFOL: SHX5810

## 2019-04-06 LAB — COMPREHENSIVE METABOLIC PANEL
ALT: 69 U/L — ABNORMAL HIGH (ref 0–44)
AST: 63 U/L — ABNORMAL HIGH (ref 15–41)
Albumin: 1.9 g/dL — ABNORMAL LOW (ref 3.5–5.0)
Alkaline Phosphatase: 195 U/L — ABNORMAL HIGH (ref 38–126)
Anion gap: 8 (ref 5–15)
BUN: 49 mg/dL — ABNORMAL HIGH (ref 8–23)
CO2: 21 mmol/L — ABNORMAL LOW (ref 22–32)
Calcium: 7.8 mg/dL — ABNORMAL LOW (ref 8.9–10.3)
Chloride: 105 mmol/L (ref 98–111)
Creatinine, Ser: 3.22 mg/dL — ABNORMAL HIGH (ref 0.61–1.24)
GFR calc Af Amer: 19 mL/min — ABNORMAL LOW (ref >60)
GFR calc non Af Amer: 16 mL/min — ABNORMAL LOW (ref >60)
Glucose, Bld: 87 mg/dL (ref 70–99)
Potassium: 4.4 mmol/L (ref 3.5–5.1)
Sodium: 134 mmol/L — ABNORMAL LOW (ref 135–145)
Total Bilirubin: 1.1 mg/dL (ref 0.3–1.2)
Total Protein: 4.6 g/dL — ABNORMAL LOW (ref 6.5–8.1)

## 2019-04-06 LAB — CBC
HCT: 26.6 % — ABNORMAL LOW (ref 39.0–52.0)
Hemoglobin: 9 g/dL — ABNORMAL LOW (ref 13.0–17.0)
MCH: 30.5 pg (ref 26.0–34.0)
MCHC: 33.8 g/dL (ref 30.0–36.0)
MCV: 90.2 fL (ref 80.0–100.0)
Platelets: 68 10*3/uL — ABNORMAL LOW (ref 150–400)
RBC: 2.95 MIL/uL — ABNORMAL LOW (ref 4.22–5.81)
RDW: 14.9 % (ref 11.5–15.5)
WBC: 3.8 10*3/uL — ABNORMAL LOW (ref 4.0–10.5)
nRBC: 0 % (ref 0.0–0.2)

## 2019-04-06 SURGERY — ENDOSCOPIC RETROGRADE CHOLANGIOPANCREATOGRAPHY (ERCP) WITH PROPOFOL
Anesthesia: General

## 2019-04-06 MED ORDER — ONDANSETRON HCL 4 MG/2ML IJ SOLN
INTRAMUSCULAR | Status: DC | PRN
  Administered 2019-04-06: 4 mg via INTRAVENOUS

## 2019-04-06 MED ORDER — PHENYLEPHRINE HCL (PRESSORS) 10 MG/ML IV SOLN
INTRAVENOUS | Status: DC | PRN
  Administered 2019-04-06: 100 ug via INTRAVENOUS
  Administered 2019-04-06: 200 ug via INTRAVENOUS
  Administered 2019-04-06: 100 ug via INTRAVENOUS

## 2019-04-06 MED ORDER — FENTANYL CITRATE (PF) 100 MCG/2ML IJ SOLN: 25.0000 ug | INTRAMUSCULAR | Status: DC | PRN

## 2019-04-06 MED ORDER — FENTANYL CITRATE (PF) 100 MCG/2ML IJ SOLN
INTRAMUSCULAR | Status: AC
  Filled 2019-04-06: qty 2

## 2019-04-06 MED ORDER — SUCCINYLCHOLINE CHLORIDE 20 MG/ML IJ SOLN
INTRAMUSCULAR | Status: DC | PRN
  Administered 2019-04-06: 80 mg via INTRAVENOUS

## 2019-04-06 MED ORDER — LIDOCAINE HCL (CARDIAC) PF 100 MG/5ML IV SOSY
PREFILLED_SYRINGE | INTRAVENOUS | Status: DC | PRN
  Administered 2019-04-06: 100 mg via INTRAVENOUS

## 2019-04-06 MED ORDER — INDOMETHACIN 50 MG RE SUPP
RECTAL | Status: AC
  Filled 2019-04-06: qty 2

## 2019-04-06 MED ORDER — LIDOCAINE HCL (PF) 2 % IJ SOLN
INTRAMUSCULAR | Status: AC
  Filled 2019-04-06: qty 10

## 2019-04-06 MED ORDER — ONDANSETRON HCL 4 MG/2ML IJ SOLN: 4.0000 mg | Freq: Once | INTRAMUSCULAR | Status: DC | PRN

## 2019-04-06 MED ORDER — INDOMETHACIN 50 MG RE SUPP
RECTAL | Status: DC | PRN
  Administered 2019-04-06: 100 mg via RECTAL

## 2019-04-06 MED ORDER — SUCCINYLCHOLINE CHLORIDE 20 MG/ML IJ SOLN
INTRAMUSCULAR | Status: AC
  Filled 2019-04-06: qty 1

## 2019-04-06 MED ORDER — INDOMETHACIN 50 MG RE SUPP: 100.0000 mg | Freq: Once | RECTAL | Status: DC

## 2019-04-06 MED ORDER — SODIUM CHLORIDE 0.9 % IV SOLN
INTRAVENOUS | Status: DC | PRN
  Administered 2019-04-06: 1000 mL
  Administered 2019-04-06: 09:00:00 via INTRAVENOUS

## 2019-04-06 MED ORDER — LACTATED RINGERS IV SOLN: INTRAVENOUS | Status: DC

## 2019-04-06 MED ORDER — FENTANYL CITRATE (PF) 100 MCG/2ML IJ SOLN
INTRAMUSCULAR | Status: DC | PRN
  Administered 2019-04-06: 25 ug via INTRAVENOUS

## 2019-04-06 MED ORDER — ONDANSETRON HCL 4 MG/2ML IJ SOLN
INTRAMUSCULAR | Status: AC
  Filled 2019-04-06: qty 2

## 2019-04-06 MED ORDER — PROPOFOL 10 MG/ML IV BOLUS
INTRAVENOUS | Status: AC
  Filled 2019-04-06: qty 20

## 2019-04-06 NOTE — Anesthesia Post-op Follow-up Note (Signed)
Anesthesia QCDR form completed.        

## 2019-04-06 NOTE — TOC Progression Note (Signed)
Transition of Care [facility]) - Progression Note    Patient Details  Name: [name] MRN: [id number] Date of Birth: 1927-06-02  Transition of Care [facility]) CM/SW Contact  Beverly Sessions, RN Phone Number: 04/06/2019, 2:53 PM  Clinical Narrative:     Plan for discharge [place] with [place] aware  MD has ordered repeat covid test   Expected Discharge Plan: [place] Barriers to Discharge: Continued Medical Work up  Expected Discharge Plan and Services Expected Discharge Plan: [place]       Living arrangements for the past 2 months: Single Family Home                               Date Agenda: 04/03/19   Representative spoke with at [place]: [place]) Interventions    Readmission Risk Interventions Readmission Risk Prevention Plan 04/03/2019  Transportation Screening Complete  Medication Review [employer]) Complete  Some recent data might be hidden

## 2019-04-06 NOTE — Anesthesia Preprocedure Evaluation (Signed)
Anesthesia Evaluation  Patient identified by MRN, date of birth, ID band Patient confused    Reviewed: Allergy & Precautions, NPO status , Patient's Chart, lab work & pertinent test results  History of Anesthesia Complications Negative for: history of anesthetic complications  Airway Mallampati: III       Dental  (+) Upper Dentures, Lower Dentures, Missing, Chipped   Pulmonary neg sleep apnea, neg COPD, Not current smoker, former smoker,           Cardiovascular hypertension, Pt. on medications and Pt. on home beta blockers +CHF  (-) Past MI (-) dysrhythmias (-) Valvular Problems/Murmurs     Neuro/Psych neg Seizures    GI/Hepatic Neg liver ROS, neg GERD  ,  Endo/Other  neg diabetes  Renal/GU Renal InsufficiencyRenal disease     Musculoskeletal   Abdominal   Peds  Hematology   Anesthesia Other Findings   Reproductive/Obstetrics                             Anesthesia Physical Anesthesia Plan  ASA: III and emergent  Anesthesia Plan: General   Post-op Pain Management:    Induction: Intravenous  PONV Risk Score and Plan: 2 and Dexamethasone and Ondansetron  Airway Management Planned: Oral ETT  Additional Equipment:   Intra-op Plan:   Post-operative Plan:   Informed Consent: I have reviewed the patients History and Physical, chart, labs and discussed the procedure including the risks, benefits and alternatives for the proposed anesthesia with the patient or authorized representative who has indicated his/her understanding and acceptance.       Plan Discussed with:   Anesthesia Plan Comments:         Anesthesia Quick Evaluation

## 2019-04-06 NOTE — Progress Notes (Addendum)
[place] [facility]                                                                                                                                                                                  Patient Demographics   [name], is a 85 y.o. male, DOB - 02-20-1927, VOJ:[id number]  Admit date - 04/02/2019   Admitting Physician Lang Snow, NP  Outpatient Primary MD for the patient is [facility], [place]   LOS - 4  Subjective: Patient continues to do well. He says he is ready to get this ERCP over with. He denies any abdominal pain. No fevers or chills.   Review of Systems:   CONSTITUTIONAL: No documented fever. No fatigue, weakness. No weight gain, no weight loss.  EYES: No blurry or double vision.  ENT: No tinnitus. No postnasal drip. No redness of the oropharynx.  RESPIRATORY: No cough, no wheeze, no hemoptysis. No dyspnea.  CARDIOVASCULAR: No chest pain. No orthopnea. No palpitations. No syncope.  GASTROINTESTINAL: No nausea, no vomiting or diarrhea. No abdominal pain. No melena or hematochezia.  GENITOURINARY: No dysuria or hematuria.  ENDOCRINE: No polyuria or nocturia. No heat or cold intolerance.  HEMATOLOGY: No anemia. No bruising. No bleeding.  INTEGUMENTARY: No rashes. No lesions.  MUSCULOSKELETAL: No arthritis. No swelling. No gout.  NEUROLOGIC: No numbness, tingling, or ataxia. No seizure-type activity.  PSYCHIATRIC: No anxiety. No insomnia. No ADD.   Vitals:   Vitals:   04/06/19 0516 04/06/19 0904 04/06/19 1009 04/06/19 1024  BP: (!) 111/59 112/71 (!) 121/54 113/69  Pulse: (!) 42 (!) 113 91 79  Resp: 20 18 19 18   Temp: 97.7 F (36.5 C) (!) 95.8 F (35.4 C) 98.4 F (36.9 C)   TempSrc: Oral Tympanic    SpO2: 97%  100% 99%  Weight:  59 kg    Height:  5\' 8"  (1.727 m)      Wt Readings from Last 3 Encounters:  04/06/19 59 kg  03/31/19 67 kg  05/28/18 66 kg     Intake/Output Summary (Last 24 hours) at  04/06/2019 1032 Last data filed at 04/06/2019 0934 Gross per 24 hour  Intake 102.21 ml  Output 1200 ml  Net -1097.79 ml    Physical Exam:   GENERAL: Pleasant-appearing in no apparent distress.  Sitting up in bed eating breakfast. HEENT: Atraumatic, normocephalic. Extraocular muscles are intact. Pupils equal and reactive to light. Sclerae anicteric. No conjunctival injection. No oro-pharyngeal erythema.  NECK: Supple. There is no jugular venous distention. No bruits, no lymphadenopathy, no thyromegaly.  HEART: Regular rate and rhythm,. No murmurs, no rubs, no clicks.  LUNGS: Clear to auscultation bilaterally.  No rales or rhonchi. No wheezes.  ABDOMEN: Soft, flat, nontender, nondistended. Has good bowel sounds. No hepatosplenomegaly appreciated.  EXTREMITIES: No evidence of any cyanosis, clubbing, or peripheral edema.  +2 pedal and radial pulses bilaterally.  NEUROLOGIC: The patient is alert, awake, and oriented x3 with no focal motor or sensory deficits appreciated bilaterally.  SKIN: Moist and warm with no rashes appreciated.  Psych: Not anxious, depressed  Antibiotics   Anti-infectives (From admission, onward)   Start     Dose/Rate Route Frequency Ordered Stop   04/05/19 1200  [MAR Hold]  cefTRIAXone (ROCEPHIN) 2 g in sodium chloride 0.9 % 100 mL IVPB     (MAR Hold since Mon 04/06/2019 at 0919.Hold Reason: Transfer to a Procedural area.)   2 g 200 mL/hr over 30 Minutes Intravenous Every 24 hours 04/05/19 1010     04/04/19 1200  cefTRIAXone (ROCEPHIN) 2 g in sodium chloride 0.9 % 100 mL IVPB  Status:  Discontinued     2 g 200 mL/hr over 30 Minutes Intravenous Every 24 hours 04/04/19 0945 04/05/19 1010   04/03/19 1500  vancomycin (VANCOCIN) 500 mg in sodium chloride 0.9 % 100 mL IVPB  Status:  Discontinued     500 mg 100 mL/hr over 60 Minutes Intravenous Every 36 hours 04/02/19 0528 04/02/19 1215   04/03/19 0200  meropenem (MERREM) 500 mg in sodium chloride 0.9 % 100 mL IVPB  Status:   Discontinued     500 mg 200 mL/hr over 30 Minutes Intravenous Every 12 hours 04/02/19 1450 04/04/19 0945   04/02/19 0230  meropenem (MERREM) 1 g in sodium chloride 0.9 % 100 mL IVPB  Status:  Discontinued     1 g 200 mL/hr over 30 Minutes Intravenous Every 12 hours 04/02/19 0218 04/02/19 1450   04/02/19 0230  vancomycin (VANCOCIN) 1,500 mg in sodium chloride 0.9 % 500 mL IVPB     1,500 mg 250 mL/hr over 120 Minutes Intravenous  Once 04/02/19 0219 04/02/19 0508   04/02/19 0215  aztreonam (AZACTAM) 2 g in sodium chloride 0.9 % 100 mL IVPB  Status:  Discontinued     2 g 200 mL/hr over 30 Minutes Intravenous  Once 04/02/19 0201 04/02/19 0218   04/02/19 0215  metroNIDAZOLE (FLAGYL) IVPB 500 mg  Status:  Discontinued     500 mg 100 mL/hr over 60 Minutes Intravenous  Once 04/02/19 0201 04/02/19 0218   04/02/19 0215  vancomycin (VANCOCIN) IVPB 1000 mg/200 mL premix  Status:  Discontinued     1,000 mg 200 mL/hr over 60 Minutes Intravenous  Once 04/02/19 0201 04/02/19 0219      Medications   Scheduled Meds: . [MAR Hold] aspirin EC  81 mg Oral QHS  . [MAR Hold] calcitRIOL  0.25 mcg Oral Once per day on Mon Wed Fri  . [MAR Hold] cholecalciferol  5,000 Units Oral Daily  . [MAR Hold] doxazosin  4 mg Oral QHS  . [MAR Hold] feeding supplement (ENSURE ENLIVE)  237 mL Oral BID BM  . [MAR Hold] ferrous sulfate  325 mg Oral BID  . [MAR Hold] folic acid  1 mg Oral Daily  . [MAR Hold] indomethacin  100 mg Rectal Once  . indomethacin      . [MAR Hold] influenza vaccine adjuvanted  0.5 mL Intramuscular Tomorrow-1000  . [MAR Hold] isosorbide mononitrate  30 mg Oral Daily  . [MAR Hold] levothyroxine  88 mcg Oral Daily  . [MAR Hold] multivitamin with minerals  1 tablet Oral Daily  . [  MAR Hold] omega-3 acid ethyl esters  1 g Oral Daily  . [MAR Hold] pantoprazole  40 mg Oral BID  . [MAR Hold] sodium bicarbonate  1,300 mg Oral BID  . [MAR Hold] tamsulosin  0.4 mg Oral QHS   Continuous Infusions: . [MAR  Hold] cefTRIAXone (ROCEPHIN)  IV Stopped (04/05/19 1354)  . lactated ringers     PRN Meds:.   Data Review:   Micro Results Recent Results (from the past 240 hour(s))  SARS Coronavirus 2 [facility] order, Performed in [facility] hospital lab) Nasopharyngeal Nasopharyngeal Swab     Status: None   Collection Time: 03/31/19  4:09 AM   Specimen: Nasopharyngeal Swab  Result Value Ref Range Status   SARS Coronavirus 2 NEGATIVE NEGATIVE Final    Comment: (NOTE) If result is NEGATIVE SARS-CoV-2 target nucleic acids are NOT DETECTED. The SARS-CoV-2 RNA is generally detectable in upper and lower  respiratory specimens during the acute phase of infection. The lowest  concentration of SARS-CoV-2 viral copies this assay can detect is 250  copies / mL. A negative result does not preclude SARS-CoV-2 infection  and should not be used as the sole basis for treatment or other  patient management decisions.  A negative result may occur with  improper specimen collection / handling, submission of specimen other  than nasopharyngeal swab, presence of viral mutation(s) within the  areas targeted by this assay, and inadequate number of viral copies  (<250 copies / mL). A negative result must be combined with clinical  observations, patient history, and epidemiological information. If result is POSITIVE SARS-CoV-2 target nucleic acids are DETECTED. The SARS-CoV-2 RNA is generally detectable in upper and lower  respiratory specimens dur ing the acute phase of infection.  Positive  results are indicative of active infection with SARS-CoV-2.  Clinical  correlation with patient history and other diagnostic information is  necessary to determine patient infection status.  Positive results do  not rule out bacterial infection or co-infection with other viruses. If result is PRESUMPTIVE POSTIVE SARS-CoV-2 nucleic acids MAY BE PRESENT.   A presumptive positive result was obtained on the submitted specimen   and confirmed on repeat testing.  While 2019 novel coronavirus  (SARS-CoV-2) nucleic acids may be present in the submitted sample  additional confirmatory testing may be necessary for epidemiological  and / or clinical management purposes  to differentiate between  SARS-CoV-2 and other Sarbecovirus currently known to infect humans.  If clinically indicated additional testing with an alternate test  methodology [phone]) is advised. The SARS-CoV-2 RNA is generally  detectable in upper and lower respiratory sp ecimens during the acute  phase of infection. The expected result is Negative. Fact Sheet for Patients:  StrictlyIdeas.no Fact Sheet for Healthcare Providers: BankingDealers.co.za This test is not yet approved or cleared by the [place] FDA and has been authorized for detection and/or diagnosis of SARS-CoV-2 by FDA under an Emergency Use Authorization (EUA).  This EUA will remain in effect (meaning this test can be used) for the duration of the COVID-19 declaration under Section 564(b)(1) of the Act, 21 U.S.C. section 360bbb-3(b)(1), unless the authorization is terminated or revoked sooner. Performed at [facility], [address] [place], [place] [address]   Urine culture     Status: None   Collection Time: 03/31/19 10:34 AM   Specimen: Urine, Random  Result Value Ref Range Status   Specimen Description   Final    URINE, RANDOM Performed at [facility], [place]  [address], [place], [place] [address]    Special Requests   Final    NONE Performed at [facility], [address] [place], [place] [address]    Culture   Final    NO GROWTH Performed at [facility], [place] [address], [place], [place] [address]    Report Status 04/01/2019 FINAL  Final  Blood Culture (routine x 2)     Status: Abnormal   Collection Time: 04/02/19  1:33 AM   Specimen: BLOOD  Result Value Ref Range Status    Specimen Description   Final    BLOOD LEFT ANTECUBITAL Performed at [facility], [place] [address], [place], [place] [address]    Special Requests   Final    BOTTLES DRAWN AEROBIC AND ANAEROBIC Blood Culture adequate volume Performed at [place] [place], [place] [address]    Culture  Setup Time   Final    GRAM NEGATIVE RODS ANAEROBIC BOTTLE ONLY CRITICAL RESULT CALLED TO, READ BACK BY AND VERIFIED WITH: SHEEMA HALLAJI AT 6967 ON 04/02/19 [facility] Performed at [facility], [place] [address], [place], [place] [address]    Culture ESCHERICHIA COLI (A)  Final   Report Status 04/04/2019 FINAL  Final   Organism ID, Bacteria ESCHERICHIA COLI  Final      Susceptibility   Escherichia coli - MIC*    AMPICILLIN 16 INTERMEDIATE Intermediate     CEFAZOLIN <=4 SENSITIVE Sensitive     CEFEPIME <=1 SENSITIVE Sensitive     CEFTAZIDIME <=1 SENSITIVE Sensitive     CEFTRIAXONE <=1 SENSITIVE Sensitive     CIPROFLOXACIN >=4 RESISTANT Resistant     GENTAMICIN <=1 SENSITIVE Sensitive     IMIPENEM 0.5 SENSITIVE Sensitive     TRIMETH/SULFA <=20 SENSITIVE Sensitive     AMPICILLIN/SULBACTAM 16 INTERMEDIATE Intermediate     PIP/TAZO 8 SENSITIVE Sensitive     Extended ESBL NEGATIVE Sensitive     * ESCHERICHIA COLI  Blood Culture (routine x 2)     Status: Abnormal   Collection Time: 04/02/19  1:33 AM   Specimen: BLOOD  Result Value Ref Range Status   Specimen Description   Final    BLOOD RIGHT ANTECUBITAL Performed at [facility], [place] [address] [place], [place] [address]    Special Requests   Final    BOTTLES DRAWN AEROBIC AND ANAEROBIC Blood Culture adequate volume Performed at [facility], [address] [place], [place] [address]    Culture  Setup Time   Final    GRAM NEGATIVE RODS CRITICAL RESULT CALLED TO, READ BACK BY AND VERIFIED WITH: CRITICAL VALUE NOTED.  VALUE IS CONSISTENT WITH PREVIOUSLY REPORTED AND CALLED VALUE. Performed at  [facility], [address] [place], [place] [address]    Culture (A)  Final    ESCHERICHIA COLI SUSCEPTIBILITIES PERFORMED ON PREVIOUS CULTURE WITHIN THE LAST 5 DAYS. Performed at [facility], [place] [address] [place],  [address]    Report Status 04/04/2019 FINAL  Final  Urine culture     Status: None   Collection Time: 04/02/19  1:33 AM   Specimen: In/Out Cath Urine  Result Value Ref Range Status   Specimen Description   Final    IN/OUT CATH URINE Performed at [facility], [address] [place],  [address]    Special Requests   Final    NONE Performed at [facility], [place],  [place], [place] [address]    Culture   Final    NO GROWTH Performed at [facility], [place] [address], [place], [place] [address]    Report Status 04/03/2019 FINAL  Final  Blood Culture ID Panel (Reflexed)     Status: Abnormal   Collection Time: 04/02/19  1:33 AM  Result Value Ref Range Status   Enterococcus species NOT DETECTED NOT DETECTED Final   Listeria monocytogenes NOT DETECTED NOT DETECTED Final   Staphylococcus species NOT DETECTED NOT DETECTED Final   Staphylococcus aureus (BCID) NOT DETECTED NOT DETECTED Final   Streptococcus species NOT DETECTED NOT DETECTED Final   Streptococcus agalactiae NOT DETECTED NOT DETECTED Final   Streptococcus pneumoniae NOT DETECTED NOT DETECTED Final   Streptococcus pyogenes NOT DETECTED NOT DETECTED Final   Acinetobacter baumannii NOT DETECTED NOT DETECTED Final   Enterobacteriaceae species DETECTED (A) NOT DETECTED Final    Comment: Enterobacteriaceae represent a large family of gram-negative bacteria, not a single organism. CRITICAL RESULT CALLED TO, READ BACK BY AND VERIFIED WITH: SHEEMA HALLAJI AT 6834 ON 04/02/2019 [place].    Enterobacter cloacae complex NOT DETECTED NOT DETECTED Final   Escherichia coli DETECTED (A) NOT DETECTED Final    Comment: CRITICAL RESULT CALLED TO, READ BACK BY  AND VERIFIED WITH: SHEEMA HALLAJI AT 1962 ON 04/02/19 [place].    Klebsiella oxytoca NOT DETECTED NOT DETECTED Final   Klebsiella pneumoniae NOT DETECTED NOT DETECTED Final   Proteus species NOT DETECTED NOT DETECTED Final   Serratia marcescens NOT DETECTED NOT DETECTED Final   Carbapenem resistance NOT DETECTED NOT DETECTED Final   Haemophilus influenzae NOT DETECTED NOT DETECTED Final   Neisseria meningitidis NOT DETECTED NOT DETECTED Final   Pseudomonas aeruginosa NOT DETECTED NOT DETECTED Final   Candida albicans NOT DETECTED NOT DETECTED Final   Candida glabrata NOT DETECTED NOT DETECTED Final   Candida krusei NOT DETECTED NOT DETECTED Final   Candida parapsilosis NOT DETECTED NOT DETECTED Final   Candida tropicalis NOT DETECTED NOT DETECTED Final    Comment: Performed at [facility], [address] [place], [place] [address]    Radiology Reports Ct Abdomen Pelvis Wo Contrast  Result Date: 03/31/2019 CLINICAL DATA:  Acute generalized abdominal pain. EXAM: CT ABDOMEN AND PELVIS WITHOUT CONTRAST TECHNIQUE: Multidetector CT imaging of the abdomen and pelvis was performed following the standard protocol without IV contrast. COMPARISON:  03/08/2018 FINDINGS: Lower chest: Sizable hiatal hernia. Chronic trace pleural fluid at the left base. Coronary calcification. Hepatobiliary: No focal liver abnormality.No evidence of biliary obstruction or stone. Pancreas: Generalized atrophy. Spleen: Unremarkable. Adrenals/Urinary Tract: Negative adrenals. Chronic left hydroureteronephrosis due to distal ureteral calculus with severe left renal atrophy. The left kidney is likely nonfunctional. 15 mm left interpolar calculus. No right hydronephrosis. Thick walled bladder with subtle cellule/diverticulum. Mild perivesicular fat haziness. Stomach/Bowel:  No obstruction. Left colonic diverticulosis. Vascular/Lymphatic: No acute vascular abnormality. No mass or adenopathy. Haziness of fat in the sigmoid  mesentery without mass or adenopathy-consistent with chronic mesenteric panniculitis. Reproductive:No acute finding Other: No ascites or pneumoperitoneum. Musculoskeletal: No acute abnormalities. Osteopenia and spinal degeneration IMPRESSION: 1. Possible cystitis. 2. Finding suggesting chronic bladder outlet obstruction. There is only mild bladder distention currently. 3. 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 4. Large hiatal hernia. Electronically Signed   By: Monte Fantasia M.D.   On: 03/31/2019 06:24   Mr Abdomen Mrcp Wo Contrast  Result Date: 04/03/2019 CLINICAL DATA:  Jaundice. Abdominal pain, fever. Chronic renal  disease. EXAM: MRI ABDOMEN WITHOUT CONTRAST  (INCLUDING MRCP) TECHNIQUE: Multiplanar multisequence MR imaging of the abdomen was performed. Heavily T2-weighted images of the biliary and pancreatic ducts were obtained, and three-dimensional MRCP images were rendered by post processing. COMPARISON:  CT March 31, 2019 FINDINGS: Lower chest: Small bilateral pleural effusions. Large hiatal hernia with at least half the stomach within LEFT hemithorax posterior the heart. Hepatobiliary: No intrahepatic biliary duct dilatation. The common hepatic duct is normal caliber at 7 mm. The common bile duct upper limits normal caliber. In the distal common bile duct there is a potential filling defect (image 5/14 MRCP sequence; and image 20/3 of T2 weighted series). This is inconclusive and there is significant motion artifact. No gallstones within the gallbladder which weighs against choledocholithiasis. Gallbladder is not distended. No pericholecystic fluid. Pancreas: No pancreatic inflammation identified. No duct dilatation. 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 One cystic lesion in tail the pancreas measures 7 mm. Spleen: Several small lesions in the spleen likely represent benign cysts. Adrenals/urinary tract: Adrenal glands normal. Chronic hydronephrosis of  the LEFT kidney with severe cortical atrophy. Stomach/Bowel: Large hiatal hernia. No abnormality of the limited view small-bowel and colon. Vascular/Lymphatic: Abdominal aortic normal caliber. No retroperitoneal periportal lymphadenopathy. Musculoskeletal: No aggressive osseous lesion IMPRESSION: 1. Potential distal common bile duct stone (choledocholithiasis). Significant motion degradation. Equivocal exam. 2. Minimal intra or extrahepatic biliary duct dilatation. 3. No evidence of acute cholecystitis. No evidence of cholelithiasis. 4. Large hiatal hernia. Chronic LEFT hydronephrosis and cortical atrophy. 5. 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 Lesions of this size do not require follow-up in this age group. This recommendation follows ACR consensus guidelines: Management of Incidental Pancreatic Cysts: A White Paper of the ACR Incidental Findings Committee. [place] 4650;[id number]. Electronically Signed   By: Suzy Bouchard M.D.   On: 04/03/2019 22:06   Mr 3d Recon At Scanner  Result Date: 04/03/2019 CLINICAL DATA:  Jaundice. Abdominal pain, fever. Chronic renal disease. EXAM: MRI ABDOMEN WITHOUT CONTRAST  (INCLUDING MRCP) TECHNIQUE: Multiplanar multisequence MR imaging of the abdomen was performed. Heavily T2-weighted images of the biliary and pancreatic ducts were obtained, and three-dimensional MRCP images were rendered by post processing. COMPARISON:  CT March 31, 2019 FINDINGS: Lower chest: Small bilateral pleural effusions. Large hiatal hernia with at least half the stomach within LEFT hemithorax posterior the heart. Hepatobiliary: No intrahepatic biliary duct dilatation. The common hepatic duct is normal caliber at 7 mm. The common bile duct upper limits normal caliber. In the distal common bile duct there is a potential filling defect (image 5/14 MRCP sequence; and image 20/3 of T2 weighted series). This is inconclusive and there is significant motion artifact.  No gallstones within the gallbladder which weighs against choledocholithiasis. Gallbladder is not distended. No pericholecystic fluid. Pancreas: No pancreatic inflammation identified. No duct dilatation. 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 One cystic lesion in tail the pancreas measures 7 mm. Spleen: Several small lesions in the spleen likely represent benign cysts. Adrenals/urinary tract: Adrenal glands normal. Chronic hydronephrosis of the LEFT kidney with severe cortical atrophy. Stomach/Bowel: Large hiatal hernia. No abnormality of the limited view small-bowel and colon. Vascular/Lymphatic: Abdominal aortic normal caliber. No retroperitoneal periportal lymphadenopathy. Musculoskeletal: No aggressive osseous lesion IMPRESSION: 1. Potential distal common bile duct stone (choledocholithiasis). Significant motion degradation. Equivocal exam. 2. Minimal intra or extrahepatic biliary duct dilatation. 3. No evidence of acute cholecystitis. No evidence of cholelithiasis. 4. Large hiatal hernia. Chronic LEFT  hydronephrosis and cortical atrophy. 5. 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 Lesions of this size do not require follow-up in this age group. This recommendation follows ACR consensus guidelines: Management of Incidental Pancreatic Cysts: A White Paper of the ACR Incidental Findings Committee. [place] 3295;[id number]. Electronically Signed   By: Suzy Bouchard M.D.   On: 04/03/2019 22:06   Dg Chest Port 1 View  Result Date: 04/02/2019 CLINICAL DATA:  84 year old male with a history of productive cough EXAM: PORTABLE CHEST 1 VIEW COMPARISON:  April 02, 2019, March 31, 2019 FINDINGS: Cardiomediastinal silhouette unchanged in size and contour. Asymmetric elevation of left hemidiaphragm with interposed gas bubble. Reticular opacities the bilateral lungs, similar to the prior. No new interlobular septal thickening. No large pleural effusion. No  pneumothorax. No new confluent airspace disease. Retrocardiac opacity is persisting with blunting of left costophrenic angle. IMPRESSION: Similar appearance of the chest x-ray with chronic lung changes and no evidence of acute cardiopulmonary disease. Electronically Signed   By: Corrie Mckusick D.O.   On: 04/02/2019 15:42   Dg Chest Port 1 View  Result Date: 04/02/2019 CLINICAL DATA:  85 year old male with fever and cough. EXAM: PORTABLE CHEST 1 VIEW COMPARISON:  Chest radiograph dated 03/31/2019 FINDINGS: Left lung base density, likely atelectasis. Infiltrate is not excluded. Clinical correlation is recommended. There is diffuse interstitial prominence similar prior radiograph. No new consolidative changes. No large pleural effusion or pneumothorax. Stable cardiac silhouette. Moderate size hiatal hernia. No acute osseous pathology. IMPRESSION: No interval change. Electronically Signed   By: Anner Crete M.D.   On: 04/02/2019 02:25   Dg Chest Portable 1 View  Result Date: 03/31/2019 CLINICAL DATA:  Cough and dyspnea EXAM: PORTABLE CHEST 1 VIEW COMPARISON:  03/08/2018 chest x-ray and abdominal CT FINDINGS: Apparent cardiomegaly is primarily related to a large hiatal hernia based on prior. There is also scarring at the left lower lobe. Interstitial crowding at the bases. No edema, effusion, or pneumothorax. Study is limited by leftward rotation. IMPRESSION: 1. Low volume chest with atelectasis at the bases. 2. Large hiatal hernia with overlying scar/atelectasis limiting assessment of the left base. Electronically Signed   By: Monte Fantasia M.D.   On: 03/31/2019 05:06   Dg C-arm 1-60 Min-no Report  Result Date: 04/06/2019 Fluoroscopy was utilized by the requesting physician.  No radiographic interpretation.   US Abdomen Limited Ruq  Result Date: 03/31/2019 CLINICAL DATA:  Right upper quadrant pain.  Elevated LFTs. EXAM: ULTRASOUND ABDOMEN LIMITED RIGHT UPPER QUADRANT COMPARISON:  CT 03/31/2019.  FINDINGS: Gallbladder: No gallstones or wall thickening visualized. No sonographic Murphy sign noted by sonographer. Common bile duct: Diameter: 5.4 mm Liver: No focal lesion identified. Within normal limits in parenchymal echogenicity. Portal vein is patent on color Doppler imaging with normal direction of blood flow towards the liver. Other: None. IMPRESSION: No acute abnormality. No gallstones or biliary distention. Liver appears normal. Electronically Signed   By: Marcello Moores  Register   On: 03/31/2019 07:41     CBC Recent Labs  Lab 03/31/19 0408  04/02/19 0133 04/02/19 0516 04/03/19 0306 04/04/19 0815 04/05/19 0439 04/06/19 0435  WBC 4.7  --  2.6* 5.9 7.3 4.8 3.6* 3.8*  HGB 11.5*   < > 9.7* 9.2* 8.8* 10.0* 8.6* 9.0*  HCT 35.6*   < > 29.0* 28.0* 26.3* 30.0* 25.8* 26.6*  PLT 103*  --  73* 76* 63* 75* 68* 68*  MCV 93.4  --  92.1 93.0 91.0 92.3 89.3  90.2  MCH 30.2  --  30.8 30.6 30.4 30.8 29.8 30.5  MCHC 32.3  --  33.4 32.9 33.5 33.3 33.3 33.8  RDW 14.4  --  14.6 14.7 14.6 14.8 14.8 14.9  LYMPHSABS 0.2*  --  0.1*  --   --   --   --   --   MONOABS 0.1  --  0.0*  --   --   --   --   --   EOSABS 0.0  --  0.0  --   --   --   --   --   BASOSABS 0.0  --  0.0  --   --   --   --   --    < > = values in this interval not displayed.    Chemistries  Recent Labs  Lab 04/02/19 0133 04/02/19 0516 04/02/19 1852 04/03/19 0306 04/04/19 0815 04/05/19 0439 04/06/19 0435  NA 132* 135  --  135 134* 134* 134*  K 3.4* 2.9* 4.9 4.5 3.9 4.0 4.4  CL 102 105  --  107 104 107 105  CO2 16* 17*  --  18* 18* 20* 21*  GLUCOSE 88 76  --  96 112* 86 87  BUN 52* 47*  --  48* 50* 50* 49*  CREATININE 3.69* 3.45*  --  3.39* 3.44* 3.43* 3.22*  CALCIUM 7.4* 7.0*  --  7.3* 8.0* 7.8* 7.8*  MG  --  1.6*  --  2.3  --  2.3  --   AST 217* 177*  --  133*  --  68* 63*  ALT 214* 189*  --  160*  --  86* 69*  ALKPHOS 244* 228*  --  206*  --  189* 195*  BILITOT 4.0* 4.1*  --  4.0*  --  1.3* 1.1    ------------------------------------------------------------------------------------------------------------------ estimated creatinine clearance is 13 mL/min (A) (by C-G formula based on SCr of 3.22 mg/dL (H)). ------------------------------------------------------------------------------------------------------------------ No results for input(s): HGBA1C in the last 72 hours. ------------------------------------------------------------------------------------------------------------------ No results for input(s): CHOL, HDL, LDLCALC, TRIG, CHOLHDL, LDLDIRECT in the last 72 hours. ------------------------------------------------------------------------------------------------------------------ No results for input(s): TSH, T4TOTAL, T3FREE, THYROIDAB in the last 72 hours.  Invalid input(s): FREET3 ------------------------------------------------------------------------------------------------------------------ Recent Labs    04/03/19 1552 04/04/19 0815  VITAMINB12  --  391  FOLATE  --  5.9*  FERRITIN 950*  --     Coagulation profile Recent Labs  Lab 03/31/19 0456 04/02/19 0133  INR 1.0 1.0    No results for input(s): DDIMER in the last 72 hours.  Cardiac Enzymes No results for input(s): CKMB, TROPONINI, MYOGLOBIN in the last 168 hours.  Invalid input(s): CK ------------------------------------------------------------------------------------------------------------------ Invalid input(s): [place]. Coli bacteremia- due to choledocholithiasis. Meeting sepsis criteria on admission, but this has resolved. Urine culture with no growth. -MRCP 9/25 with possible CBD stone -ERCP 9/28- stone was found and was removed -Clear liquid diet -GI following -ID following -Continue ceftriaxone -PT recommending SNF  Elevated LFTs- likely some underlying cirrhosis with choledocholithiasis. LFTs and bilirubin improving. -Treatment as above -Will need to  follow-up with GI as an outpatient  CKD IV- initially with AKI, but this has resolved. Baseline creatinine 2.8-3.3. -Nephrology consulted  -Avoid nephrotoxic agents -Patient does have chronic left-sided hydronephrosis and should follow-up with urology as an outpatient  Chronic normocytic anemia- hemoglobin at baseline. Folate deficiency likely contributing. -Continue folic acid 1mg  daily -Monitor  Chronic diastolic congestive heart failure- does not appear  volume overloaded. -Last ECHO 03/2018 LVEF 60 to 65% -Continue Imdur -Holding home torsemide with AKI  CAD- stable, no active chest pain. -Continue home aspirin  Hyperlipidemia -Continue home lipitor  Hypertension- BP well-controllled -Continue home metoprolol and doxazosin  Hypothyroidism -Continue Synthroid  Chronic thrombocytopenia- likely due to underlying liver disease -Monitor  Moderate protein-calorie malnutrition -Dietician consult  Patient can likely be discharged tomorrow to SNF.     Code Status Orders  (From admission, onward)         Start     Ordered   04/02/19 0247  Full code  Continuous     04/02/19 0251        Code Status History    Date Active Date Inactive Code Status Order ID Comments User Context   03/08/2018 1125 03/10/2018 2251 Full Code [id number]  Kathi Ludwig, MD Inpatient   07/20/2016 0614 07/23/2016 1828 Full Code [id number]  Harrie Foreman, MD Inpatient   06/25/2016 0615 06/30/2016 1617 Full Code [id number]  Harrie Foreman, MD Inpatient   07/20/2015 1200 07/23/2015 1548 Full Code [id number]  Epifanio Lesches, MD ED   06/03/2015 1517 06/07/2015 1218 Full Code [id number]  Bettey Costa, MD Inpatient   Advance Care Planning Activity      Consults Gastroenterology, ID  DVT Prophylaxis  SCDs due to thrombocytopenia  Lab Results  Component Value Date   PLT 68 (L) 04/06/2019     Time Spent in minutes   21 min  Evette Doffing M.D on 04/06/2019 at 10:32 AM  Between 7am  to 6pm - Pager - [phone]  After 6pm go to [email or web address] - Proofreader  [employer]   Office  [phone]

## 2019-04-06 NOTE — Progress Notes (Signed)
PT Cancellation Note  Patient Details Name: [name] MRN: [id number] DOB: Jun 05, 1927   Cancelled Treatment:    Reason Eval/Treat Not Completed: (Pt off the floor at this time for procedure (ERCP). PT will follow up as able.)   Lieutenant Diego PT, DPT 10:21 AM,04/06/19 [phone]

## 2019-04-06 NOTE — Consult Note (Signed)
PHARMACY CONSULT NOTE - FOLLOW UP  Pharmacy Consult for Electrolyte Monitoring and Replacement   Recent Labs: Potassium (mmol/L)  Date Value  04/06/2019 4.4  03/07/2012 3.4 (L)   Magnesium (mg/dL)  Date Value  04/05/2019 2.3  03/06/2012 1.9   Calcium (mg/dL)  Date Value  04/06/2019 7.8 (L)   Calcium, Total (mg/dL)  Date Value  03/07/2012 8.2 (L)   Albumin (g/dL)  Date Value  04/06/2019 1.9 (L)  03/05/2012 3.5   Phosphorus (mg/dL)  Date Value  04/03/2019 4.5   Sodium (mmol/L)  Date Value  04/06/2019 134 (L)  03/07/2012 142    Assessment: 85 y.o. male with pertinent past medical history of CAD, chronic diastolic congestive heart failure, hyperlipidemia, hypertension, prostate cancer, CKD stage IV, chronic left hydronephrosis, and chronic elevated LFTs presenting to the ED with chief complaints of fever and abdominal pain. Has sodium bicarb 1300mg  bid ordered.  Pharmacy has been consulted to monitor and replenish electrolytes.  Goal of Therapy:  Electrolytes wnl's  Plan:  No electrolyte supplementation warranted at this time  Labs appear stable Will defer ordering labs to MD. Plan to discharge patient tomorrow.  Tawnya Crook, PharmD Clinical Pharmacist 04/06/2019 2:58 PM

## 2019-04-06 NOTE — Care Management Important Message (Signed)
Important Message  Patient Details  Name: [name] MRN: [id number] Date of Birth: 05-May-1927   Medicare Important Message Given:  Yes     Dannette Barbara 04/06/2019, 12:07 PM

## 2019-04-06 NOTE — Transfer of Care (Signed)
Immediate Anesthesia Transfer of Care Note  Patient: [name]  Procedure(s) Performed: ENDOSCOPIC RETROGRADE CHOLANGIOPANCREATOGRAPHY (ERCP) WITH PROPOFOL (N/A )  Patient Location: PACU  Anesthesia Type:General  Level of Consciousness: sedated  Airway & Oxygen Therapy: Patient Spontanous Breathing and Patient connected to face mask oxygen  Post-op Assessment: Report given to RN and Post -op Vital signs reviewed and stable  Post vital signs: Reviewed and stable  Last Vitals:  Vitals Value Taken Time  BP 121/54 04/06/19 1009  Temp    Pulse 89 04/06/19 1009  Resp 19 04/06/19 1009  SpO2 100 % 04/06/19 1009  Vitals shown include unvalidated device data.  Last Pain:  Vitals:   04/06/19 1009  TempSrc:   PainSc: 0-No pain         Complications: No apparent anesthesia complications

## 2019-04-06 NOTE — Progress Notes (Signed)
   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 Also had biliary choledocholithiasis and underwent sphincterotomy and balloon sweep and removal of stone and sludge  Medications:  . aspirin EC  81 mg Oral QHS  . calcitRIOL  0.25 mcg Oral Once per day on Mon Wed Fri  . cholecalciferol  5,000 Units Oral Daily  . doxazosin  4 mg Oral QHS  . feeding supplement (ENSURE ENLIVE)  237 mL Oral BID BM  . ferrous sulfate  325 mg Oral BID  . folic acid  1 mg Oral Daily  . indomethacin  100 mg Rectal Once  . indomethacin      . influenza vaccine adjuvanted  0.5 mL Intramuscular Tomorrow-1000  . isosorbide mononitrate  30 mg Oral Daily  . levothyroxine  88 mcg Oral Daily  . multivitamin with minerals  1 tablet Oral Daily  . omega-3 acid ethyl esters  1 g Oral Daily  . pantoprazole  40 mg Oral BID  . sodium bicarbonate  1,300 mg Oral BID  . tamsulosin  0.4 mg Oral QHS    Objective: Vital signs in last 24 hours: Temp:  [95.8 F (35.4 C)-98.4 F (36.9 C)] 98 F (36.7 C) (09/28 1113) Pulse Rate:  [42-113] 73 (09/28 1113) Resp:  [16-20] 16 (09/28 1113) BP: (110-126)/(48-71) 110/60 (09/28 1113) SpO2:  [97 %-100 %] 99 % (09/28 1113) Weight:  [59 kg] 59 kg (09/28 0904)  PHYSICAL EXAM:  General: tired , pale Neck:soft  swelling left side of the neck Chest B/l air entry HS s1s2 Extremities: atraumatic, no cyanosis. No edema. No clubbing Skin: No rashes or lesions. Or bruising Lymph: Cervical, supraclavicular normal. Neurologic: Grossly non-focal  Lab Results Recent Labs    04/05/19 0439 04/06/19 0435  WBC 3.6* 3.8*  HGB 8.6* 9.0*  HCT 25.8* 26.6*  NA 134* 134*  K 4.0 4.4  CL 107 105  CO2 20* 21*  BUN 50* 49*  CREATININE 3.43* 3.22*   Liver Panel Recent Labs    04/05/19 0439 04/06/19 0435  PROT 4.4* 4.6*  ALBUMIN 1.8* 1.9*  AST 68* 63*  ALT 86* 69*   ALKPHOS 189* 195*  BILITOT 1.3* 1.1   Sedimentation Rate No results for input(s): ESRSEDRATE in the last 72 hours. C-Reactive Protein No results for input(s): CRP in the last 72 hours.  Microbiology:  Studies/Results: Dg C-arm 1-60 Min-no Report  Result Date: 04/06/2019 Fluoroscopy was utilized by the requesting physician.  No radiographic interpretation.     Assessment/Plan: [name] is a 85 y.o. male  with a history of prostate ca, CKD, CHF, HTN , chronic left hydronephrosis admitted from home with fever and abdominal pain.  Pt has a complicated gall bladder history   E.coli bacteremia due to ascending cholangitis from choledocholithiasis -s/p biliary sphincterotomy and stone removal- on ceftriaxone- will give for a total of 10 days. As e.coli resistant to quinolone will continue IV ceftriaxone until 04/11/19  Abnormal LFTS improving  Anemia- folate deficiency contributing  Chronic Thrombocytopenia   CKD - chronic left sided hydronephrosis  Prostate Ca  Discussed the management with the patient

## 2019-04-06 NOTE — Op Note (Signed)
[facility] Patient Name: [name] Procedure Date: 04/06/2019 9:13 AM MRN: [id number] Account #: [id number] Date of Birth: 1927-02-05 Admit Type: Inpatient Age: 85 Room: [facility] ENDO ROOM 4 Gender: Male Note Status: Finalized Procedure:            ERCP Indications:          Common bile duct stone(s), Elevated liver enzymes Providers:            Lucilla Lame MD, MD Referring MD:         No Local Md, MD (Referring MD) Medicines:            General Anesthesia Complications:        No immediate complications. Procedure:            Pre-Anesthesia Assessment:                       - Prior to the procedure, a History and Physical was                        performed, and patient medications and allergies were                        reviewed. The patient's tolerance of previous                        anesthesia was also reviewed. The risks and benefits of                        the procedure and the sedation options and risks were                        discussed with the patient. All questions were                        answered, and informed consent was obtained. Prior                        Anticoagulants: The patient has taken no previous                        anticoagulant or antiplatelet agents. ASA Grade                        Assessment: III - A patient with severe systemic                        disease. After reviewing the risks and benefits, the                        patient was deemed in satisfactory condition to undergo                        the procedure.                       After obtaining informed consent, the scope was passed                        under direct vision. Throughout the procedure, the  patient's blood pressure, pulse, and oxygen saturations                        were monitored continuously. The Duodenoscope was                        introduced through the mouth, and used to inject            contrast into and used to inject contrast into the bile                        duct. The ERCP was accomplished without difficulty. The                        patient tolerated the procedure well. Findings:      The scout film was normal. A standard esophagogastroduodenoscopy scope       was used for the examination of the upper gastrointestinal tract. The       scope was passed under direct vision through the upper GI tract. One       benign-appearing, intrinsic moderate stenosis was found at the       gastroesophageal junction. The stenosis was traversed after dilation. A       TTS dilator was passed through the scope. Dilation with a 12-13.5-15 mm       balloon dilator was performed to 15 mm at the gastroesophageal junction.       The major papilla was some distance away from a diverticulum. The bile       duct was deeply cannulated with the short-nosed traction sphincterotome.       Contrast was injected. I personally interpreted the bile duct images.       There was brisk flow of contrast through the ducts. Image quality was       excellent. Contrast extended to the entire biliary tree. A wire was       passed into the biliary tree. A 10 mm biliary sphincterotomy was made       with a traction (standard) sphincterotome using ERBE electrocautery.       There was self limited oozing from the sphincterotomy which did not       require treatment. The biliary tree was swept with a 15 mm balloon       starting at the bifurcation. Sludge was swept from the duct. One stone       was removed. No stones remained. Impression:           - Benign-appearing esophageal stenosis.                       - Dilation performed at the gastroesophageal junction.                       - The major papilla was some distance away from a                        diverticulum.                       - Choledocholithiasis was found. Complete removal was                        accomplished by biliary  sphincterotomy and balloon  extraction.                       - A biliary sphincterotomy was performed.                       - The biliary tree was swept. Recommendation:       - Return patient to hospital ward for ongoing care.                       - Clear liquid diet.                       - Continue present medications. Procedure Code(s):    --- Professional ---                       [phone], Endoscopic retrograde cholangiopancreatography                        (ERCP); with removal of calculi/debris from                        biliary/pancreatic duct(s)                       43262, Endoscopic retrograde cholangiopancreatography                        (ERCP); with sphincterotomy/papillotomy                       43249, Esophagogastroduodenoscopy, flexible, transoral;                        with transendoscopic balloon dilation of esophagus                        (less than 30 mm diameter)                       74328, Endoscopic catheterization of the biliary ductal                        system, radiological supervision and interpretation Diagnosis Code(s):    --- Professional ---                       K80.50, Calculus of bile duct without cholangitis or                        cholecystitis without obstruction                       R74.8, Abnormal levels of other serum enzymes                       K22.2, Esophageal obstruction CPT copyright 2019 [employer]. All rights reserved. The codes documented in this report are preliminary and upon coder review may  be revised to meet current compliance requirements. Lucilla Lame MD, MD 04/06/2019 10:00:00 AM This report has been signed electronically. Number of Addenda: 0 Note Initiated On: 04/06/2019 9:13 AM Estimated Blood Loss: Estimated blood loss: none.      [facility]

## 2019-04-06 NOTE — Anesthesia Postprocedure Evaluation (Signed)
Anesthesia Post Note  Patient: [name]  Procedure(s) Performed: ENDOSCOPIC RETROGRADE CHOLANGIOPANCREATOGRAPHY (ERCP) WITH PROPOFOL (N/A )  Patient location during evaluation: Endoscopy Anesthesia Type: General Level of consciousness: awake and alert Pain management: pain level controlled Vital Signs Assessment: post-procedure vital signs reviewed and stable Respiratory status: spontaneous breathing and respiratory function stable Cardiovascular status: stable Anesthetic complications: no     Last Vitals:  Vitals:   04/06/19 1009 04/06/19 1024  BP: (!) 121/54 113/69  Pulse: 91 79  Resp: 19 18  Temp: 36.9 C   SpO2: 100% 99%    Last Pain:  Vitals:   04/06/19 1024  TempSrc:   PainSc: 0-No pain                 KEPHART,WILLIAM K

## 2019-04-07 ENCOUNTER — Inpatient Hospital Stay: Payer: Self-pay

## 2019-04-07 LAB — COMPREHENSIVE METABOLIC PANEL
ALT: 57 U/L — ABNORMAL HIGH (ref 0–44)
AST: 53 U/L — ABNORMAL HIGH (ref 15–41)
Albumin: 1.9 g/dL — ABNORMAL LOW (ref 3.5–5.0)
Alkaline Phosphatase: 182 U/L — ABNORMAL HIGH (ref 38–126)
Anion gap: 8 (ref 5–15)
BUN: 45 mg/dL — ABNORMAL HIGH (ref 8–23)
CO2: 22 mmol/L (ref 22–32)
Calcium: 7.7 mg/dL — ABNORMAL LOW (ref 8.9–10.3)
Chloride: 106 mmol/L (ref 98–111)
Creatinine, Ser: 3.14 mg/dL — ABNORMAL HIGH (ref 0.61–1.24)
GFR calc Af Amer: 19 mL/min — ABNORMAL LOW (ref >60)
GFR calc non Af Amer: 17 mL/min — ABNORMAL LOW (ref >60)
Glucose, Bld: 80 mg/dL (ref 70–99)
Potassium: 4.8 mmol/L (ref 3.5–5.1)
Sodium: 136 mmol/L (ref 135–145)
Total Bilirubin: 1 mg/dL (ref 0.3–1.2)
Total Protein: 4.4 g/dL — ABNORMAL LOW (ref 6.5–8.1)

## 2019-04-07 LAB — CBC
HCT: 27.3 % — ABNORMAL LOW (ref 39.0–52.0)
Hemoglobin: 8.9 g/dL — ABNORMAL LOW (ref 13.0–17.0)
MCH: 30.2 pg (ref 26.0–34.0)
MCHC: 32.6 g/dL (ref 30.0–36.0)
MCV: 92.5 fL (ref 80.0–100.0)
Platelets: 68 10*3/uL — ABNORMAL LOW (ref 150–400)
RBC: 2.95 MIL/uL — ABNORMAL LOW (ref 4.22–5.81)
RDW: 15 % (ref 11.5–15.5)
WBC: 3.1 10*3/uL — ABNORMAL LOW (ref 4.0–10.5)
nRBC: 0 % (ref 0.0–0.2)

## 2019-04-07 LAB — NOVEL CORONAVIRUS, NAA (HOSP ORDER, SEND-OUT TO REF LAB; TAT 18-24 HRS): SARS-CoV-2, NAA: NOT DETECTED

## 2019-04-07 MED ORDER — SODIUM CHLORIDE 0.9 % IV SOLN: 2.0000 g | INTRAVENOUS | 0 refills | Status: AC

## 2019-04-07 MED ORDER — FOLIC ACID 1 MG PO TABS: 1.0000 mg | ORAL_TABLET | Freq: Every day | ORAL | 0 refills | Status: DC

## 2019-04-07 MED ORDER — ENSURE ENLIVE PO LIQD: 237.0000 mL | Freq: Two times a day (BID) | ORAL | 12 refills | Status: DC

## 2019-04-07 MED ORDER — SODIUM CHLORIDE 0.9% FLUSH
10.0000 mL | Freq: Two times a day (BID) | INTRAVENOUS | Status: DC
  Administered 2019-04-07: 10 mL

## 2019-04-07 MED ORDER — SODIUM CHLORIDE 0.9% FLUSH: 10.0000 mL | INTRAVENOUS | Status: DC | PRN

## 2019-04-07 NOTE — Discharge Summary (Signed)
[facility] NAME: [name]    MR#:  [id number]  DATE OF BIRTH:  03-Aug-1926  DATE OF ADMISSION:  04/02/2019   ADMITTING PHYSICIAN: Lang Snow, NP  DATE OF DISCHARGE: 04/07/19  PRIMARY CARE PHYSICIAN: Care, [place]   ADMISSION DIAGNOSIS:  Febrile illness [R50.9] Pancytopenia ([place]) [D61.818] Sepsis, due to unspecified organism, unspecified whether acute organ dysfunction present ([place]) [A41.9] DISCHARGE DIAGNOSIS:  Active Problems:   Sepsis ([place])   Malnutrition of moderate degree   Choledocholithiasis   Elevated liver enzymes   Stricture and stenosis of esophagus  SECONDARY DIAGNOSIS:   Past Medical History:  Diagnosis Date  . Cardiac disease   . CHF (congestive heart failure) ([place])   . Hypercholesteremia   . Hypertension   . Kidney failure    left  . Osteoporosis   . Prostate cancer ([place])   . Shingles   . Skin cancer    right cheek   HOSPITAL COURSE:   Keifer is an 85 year old male who presented to the ED with fevers and abdominal pain.  In the ED, he was meeting sepsis criteria with fever and leukopenia.  He was admitted for further management.  E. Coli bacteremia- due to choledocholithiasis. Meeting sepsis criteria on admission, but this has resolved. Urine culture with no growth. -MRCP 9/25 with possible CBD stone -ERCP 9/28- stone was found and was removed -ID recommended ceftriaxone 2 g IV daily, end date 04/11/19 -Midline IV was placed prior to discharge -PT recommending SNF  Elevated LFTs- likely some underlying cirrhosis with choledocholithiasis. LFTs and bilirubin improved. -Treatment as above -Will need to follow-up with GI as an outpatient  CKD IV- initially with AKI, but this has resolved. Baseline creatinine 2.8-3.3. -Nephrology consulted  -Avoid nephrotoxic agents -Patient does have chronic left-sided hydronephrosis and should follow-up with urology as an outpatient   Chronic normocytic anemia- hemoglobin at baseline. Folate deficiency likely contributing. -Started on folic acid 1mg  daily -Monitor  Chronic diastolic congestive heart failure- does not appear volume overloaded. -Last ECHO 03/2018 LVEF 60 to 65% -Continue Imdur -Home torsemide was restarted on discharge  CAD- stable, no active chest pain. -Continued home aspirin  Hyperlipidemia -Continued home lipitor  Hypertension- BP well-controllled -Continued home metoprolol and doxazosin  Hypothyroidism -Continued Synthroid  Chronic thrombocytopenia- likely due to underlying liver disease -Monitor  Moderate protein-calorie malnutrition -Dietician consult  DISCHARGE CONDITIONS:  E. coli bacteremia Choledocholithiasis s/p stone removal Liver cirrhosis Chronic normocytic anemia Chronic diastolic congestive heart failure CAD Hyperlipidemia Hypertension Hypothyroidism Chronic pancytopenia Moderate protein calorie malnutrition CONSULTS OBTAINED:  Treatment Team:  Lin Landsman, MD DRUG ALLERGIES:   Allergies  Allergen Reactions  . Ativan [Lorazepam] Other (See Comments)    Hallucinations, combative  . Penicillin G Other (See Comments)    Has patient had a PCN reaction causing immediate rash, facial/tongue/throat swelling, SOB or lightheadedness with hypotension: Yes Has patient had a PCN reaction causing severe rash involving mucus membranes or skin necrosis: No Has patient had a PCN reaction that required hospitalization No Has patient had a PCN reaction occurring within the last 10 years: No If all of the above answers are "NO", then may proceed with Cephalosporin use.    DISCHARGE MEDICATIONS:   Allergies as of 04/07/2019      Reactions   Ativan [lorazepam] Other (See Comments)   Hallucinations, combative   Penicillin G Other (See Comments)   Has patient had a PCN reaction causing immediate rash,  facial/tongue/throat swelling, SOB or lightheadedness with  hypotension: Yes Has patient had a PCN reaction causing severe rash involving mucus membranes or skin necrosis: No Has patient had a PCN reaction that required hospitalization No Has patient had a PCN reaction occurring within the last 10 years: No If all of the above answers are "NO", then may proceed with Cephalosporin use.      Medication List    TAKE these medications   aspirin EC 81 MG tablet Take 81 mg by mouth at bedtime.   atorvastatin 40 MG tablet Commonly known as: LIPITOR Take 40 mg by mouth at bedtime.   calcitRIOL 0.25 MCG capsule Commonly known as: ROCALTROL Take 0.25 mcg by mouth See admin instructions. Take one capsule (0.25 mcg) by mouth every Monday, Wednesday, Friday night   cefTRIAXone 2 g in sodium chloride 0.9 % 100 mL Inject 2 g into the vein daily for 4 days. Start taking on: April 08, 2019   doxazosin 4 MG tablet Commonly known as: CARDURA Take 4 mg by mouth at bedtime.   feeding supplement (ENSURE ENLIVE) Liqd Take 237 mLs by mouth 2 (two) times daily between meals.   ferrous sulfate 325 (65 FE) MG tablet Take 325 mg by mouth 2 (two) times daily.   Fish Oil 1200 MG Caps Take 1,200 mg by mouth 2 (two) times daily.   folic acid 1 MG tablet Commonly known as: FOLVITE Take 1 tablet (1 mg total) by mouth daily. Start taking on: April 08, 2019   isosorbide mononitrate 30 MG 24 hr tablet Commonly known as: IMDUR Take 1 tablet (30 mg total) by mouth daily.   levothyroxine 88 MCG tablet Commonly known as: SYNTHROID Take 88 mcg by mouth daily.   metoprolol succinate 50 MG 24 hr tablet Commonly known as: TOPROL-XL Take 50 mg by mouth daily. Take with or immediately following a meal.   pantoprazole 40 MG tablet Commonly known as: PROTONIX Take 1 tablet (40 mg total) by mouth 2 (two) times daily.   potassium chloride SA 20 MEQ tablet Commonly known as: K-DUR Take 20 mEq by mouth 2 (two) times daily.   sodium bicarbonate 650 MG tablet  Take 1,300 mg by mouth 2 (two) times daily.   tamsulosin 0.4 MG Caps capsule Commonly known as: FLOMAX Take 0.4 mg by mouth at bedtime.   torsemide 20 MG tablet Commonly known as: DEMADEX Take 40 mg by mouth daily.   TYLENOL PO Take 1-2 tablets by mouth daily as needed (pain/headache).   Vitamin D3 125 MCG (5000 UT) Tabs Take 5,000 Units by mouth daily.        DISCHARGE INSTRUCTIONS:  1.  Follow-up with PCP in 5 days 2.  Follow-up with gastroenterology in 2 weeks 3.  Take ceftriaxone 2 g IV for 4 more days, end date 10/3 DIET:  Cardiac diet DISCHARGE CONDITION:  Stable ACTIVITY:  Activity as tolerated OXYGEN:  Home Oxygen: No.  Oxygen Delivery: room air DISCHARGE LOCATION:  nursing home   If you experience worsening of your admission symptoms, develop shortness of breath, life threatening emergency, suicidal or homicidal thoughts you must seek medical attention immediately by calling 911 or calling your MD immediately  if symptoms less severe.  You Must read complete instructions/literature along with all the possible adverse reactions/side effects for all the Medicines you take and that have been prescribed to you. Take any new Medicines after you have completely understood and accpet all the possible adverse reactions/side effects.   Please  note  You were cared for by a hospitalist during your hospital stay. If you have any questions about your discharge medications or the care you received while you were in the hospital after you are discharged, you can call the unit and asked to speak with the hospitalist on call if the hospitalist that took care of you is not available. Once you are discharged, your primary care physician will handle any further medical issues. Please note that NO REFILLS for any discharge medications will be authorized once you are discharged, as it is imperative that you return to your primary care physician (or establish a relationship with a  primary care physician if you do not have one) for your aftercare needs so that they can reassess your need for medications and monitor your lab values.    On the day of Discharge:  VITAL SIGNS:  Blood pressure 110/73, pulse 74, temperature 98.5 F (36.9 C), temperature source Oral, resp. rate 16, height 5\' 8"  (1.727 m), weight 59 kg, SpO2 96 %. PHYSICAL EXAMINATION:  GENERAL:  85 y.o.-year-old patient lying in the bed with no acute distress.  EYES: Pupils equal, round, reactive to light and accommodation. No scleral icterus. Extraocular muscles intact.  HEENT: Head atraumatic, normocephalic. Oropharynx and nasopharynx clear.  NECK:  Supple, no jugular venous distention. No thyroid enlargement, no tenderness.  LUNGS: Normal breath sounds bilaterally, no wheezing, rales,rhonchi or crepitation. No use of accessory muscles of respiration.  CARDIOVASCULAR: S1, S2 normal. No murmurs, rubs, or gallops.  ABDOMEN: Soft, non-tender, non-distended. Bowel sounds present. No organomegaly or mass.  EXTREMITIES: No pedal edema, cyanosis, or clubbing.  NEUROLOGIC: Cranial nerves II through XII are intact. Muscle strength 5/5 in all extremities. Sensation intact. Gait not checked.  PSYCHIATRIC: The patient is alert and oriented x 3.  SKIN: No obvious rash, lesion, or ulcer.  DATA REVIEW:   CBC Recent Labs  Lab 04/07/19 0356  WBC 3.1*  HGB 8.9*  HCT 27.3*  PLT 68*    Chemistries  Recent Labs  Lab 04/05/19 0439  04/07/19 0356  NA 134*   < > 136  K 4.0   < > 4.8  CL 107   < > 106  CO2 20*   < > 22  GLUCOSE 86   < > 80  BUN 50*   < > 45*  CREATININE 3.43*   < > 3.14*  CALCIUM 7.8*   < > 7.7*  MG 2.3  --   --   AST 68*   < > 53*  ALT 86*   < > 57*  ALKPHOS 189*   < > 182*  BILITOT 1.3*   < > 1.0   < > = values in this interval not displayed.     Microbiology Results  Results for orders placed or performed during the hospital encounter of 04/02/19  Blood Culture (routine x 2)      Status: Abnormal   Collection Time: 04/02/19  1:33 AM   Specimen: BLOOD  Result Value Ref Range Status   Specimen Description   Final    BLOOD LEFT ANTECUBITAL Performed at [facility], [place] [address], [place], [place] [address]    Special Requests   Final    BOTTLES DRAWN AEROBIC AND ANAEROBIC Blood Culture adequate volume Performed at [facility], [address] [place], [place] [address]    Culture  Setup Time   Final    GRAM NEGATIVE RODS ANAEROBIC BOTTLE ONLY CRITICAL RESULT  CALLED TO, READ BACK BY AND VERIFIED WITH: SHEEMA HALLAJI AT 0175 ON 04/02/19 [facility] Performed at [facility], [place] [address] [place], [place] [address]    Culture ESCHERICHIA COLI (A)  Final   Report Status 04/04/2019 FINAL  Final   Organism ID, Bacteria ESCHERICHIA COLI  Final      Susceptibility   Escherichia coli - MIC*    AMPICILLIN 16 INTERMEDIATE Intermediate     CEFAZOLIN <=4 SENSITIVE Sensitive     CEFEPIME <=1 SENSITIVE Sensitive     CEFTAZIDIME <=1 SENSITIVE Sensitive     CEFTRIAXONE <=1 SENSITIVE Sensitive     CIPROFLOXACIN >=4 RESISTANT Resistant     GENTAMICIN <=1 SENSITIVE Sensitive     IMIPENEM 0.5 SENSITIVE Sensitive     TRIMETH/SULFA <=20 SENSITIVE Sensitive     AMPICILLIN/SULBACTAM 16 INTERMEDIATE Intermediate     PIP/TAZO 8 SENSITIVE Sensitive     Extended ESBL NEGATIVE Sensitive     * ESCHERICHIA COLI  Blood Culture (routine x 2)     Status: Abnormal   Collection Time: 04/02/19  1:33 AM   Specimen: BLOOD  Result Value Ref Range Status   Specimen Description   Final    BLOOD RIGHT ANTECUBITAL Performed at [facility], [place] [address] [place], [place] [address]    Special Requests   Final    BOTTLES DRAWN AEROBIC AND ANAEROBIC Blood Culture adequate volume Performed at [facility], [address] [place], [place] [address]    Culture  Setup Time   Final    GRAM NEGATIVE RODS CRITICAL RESULT CALLED TO, READ BACK BY AND VERIFIED  WITH: CRITICAL VALUE NOTED.  VALUE IS CONSISTENT WITH PREVIOUSLY REPORTED AND CALLED VALUE. Performed at [facility], [address] [place], [place] [address]    Culture (A)  Final    ESCHERICHIA COLI SUSCEPTIBILITIES PERFORMED ON PREVIOUS CULTURE WITHIN THE LAST 5 DAYS. Performed at [facility], [place] [address] [place], [place] [address]    Report Status 04/04/2019 FINAL  Final  Urine culture     Status: None   Collection Time: 04/02/19  1:33 AM   Specimen: In/Out Cath Urine  Result Value Ref Range Status   Specimen Description   Final    IN/OUT CATH URINE Performed at [facility], [address], [place], [place] [address]    Special Requests   Final    NONE Performed at [facility], [address] [place], [place] [address]    Culture   Final    NO GROWTH Performed at [facility], [place] [address], [place], [place] [address]    Report Status 04/03/2019 FINAL  Final  Blood Culture ID Panel (Reflexed)     Status: Abnormal   Collection Time: 04/02/19  1:33 AM  Result Value Ref Range Status   Enterococcus species NOT DETECTED NOT DETECTED Final   Listeria monocytogenes NOT DETECTED NOT DETECTED Final   Staphylococcus species NOT DETECTED NOT DETECTED Final   Staphylococcus aureus (BCID) NOT DETECTED NOT DETECTED Final   Streptococcus species NOT DETECTED NOT DETECTED Final   Streptococcus agalactiae NOT DETECTED NOT DETECTED Final   Streptococcus pneumoniae NOT DETECTED NOT DETECTED Final   Streptococcus pyogenes NOT DETECTED NOT DETECTED Final   Acinetobacter baumannii NOT DETECTED NOT DETECTED Final   Enterobacteriaceae species DETECTED (A) NOT DETECTED Final    Comment: Enterobacteriaceae represent a large family of gram-negative bacteria, not a single organism. CRITICAL RESULT CALLED  TO, READ BACK BY AND VERIFIED WITH: SHEEMA HALLAJI AT 1275 ON 04/02/2019 [place].    Enterobacter cloacae complex NOT DETECTED NOT DETECTED Final    Escherichia coli DETECTED (A) NOT DETECTED Final    Comment: CRITICAL RESULT CALLED TO, READ BACK BY AND VERIFIED WITH: SHEEMA HALLAJI AT 1700 ON 04/02/19 [place].    Klebsiella oxytoca NOT DETECTED NOT DETECTED Final   Klebsiella pneumoniae NOT DETECTED NOT DETECTED Final   Proteus species NOT DETECTED NOT DETECTED Final   Serratia marcescens NOT DETECTED NOT DETECTED Final   Carbapenem resistance NOT DETECTED NOT DETECTED Final   Haemophilus influenzae NOT DETECTED NOT DETECTED Final   Neisseria meningitidis NOT DETECTED NOT DETECTED Final   Pseudomonas aeruginosa NOT DETECTED NOT DETECTED Final   Candida albicans NOT DETECTED NOT DETECTED Final   Candida glabrata NOT DETECTED NOT DETECTED Final   Candida krusei NOT DETECTED NOT DETECTED Final   Candida parapsilosis NOT DETECTED NOT DETECTED Final   Candida tropicalis NOT DETECTED NOT DETECTED Final    Comment: Performed at [facility], [address] [place], [place] [address]  Culture, blood (Routine X 2) w Reflex to ID Panel     Status: None (Preliminary result)   Collection Time: 04/06/19  9:04 PM   Specimen: BLOOD  Result Value Ref Range Status   Specimen Description BLOOD RIGHT HAND  Final   Special Requests   Final    BOTTLES DRAWN AEROBIC AND ANAEROBIC Blood Culture results may not be optimal due to an inadequate volume of blood received in culture bottles   Culture   Final    NO GROWTH < 12 HOURS Performed at [facility], [address], [place], [place] [address]    Report Status PENDING  Incomplete  Culture, blood (Routine X 2) w Reflex to ID Panel     Status: None (Preliminary result)   Collection Time: 04/06/19  9:04 PM   Specimen: BLOOD  Result Value Ref Range Status   Specimen Description BLOOD LEFT HAND  Final   Special Requests   Final    BOTTLES DRAWN AEROBIC AND ANAEROBIC Blood Culture adequate volume   Culture   Final    NO GROWTH < 12 HOURS Performed at [facility], [address], [place], [place] [address]    Report Status PENDING  Incomplete    RADIOLOGY:  [place] Ekg Site Rite  Result Date: 04/07/2019 If Site Rite image not attached, placement could not be confirmed due to current cardiac rhythm.    Management plans discussed with the patient, family and they are in agreement.  CODE STATUS: Full Code   TOTAL TIME TAKING CARE OF THIS PATIENT: 45 minutes.    Berna Spare Takeo Harts M.D on 04/07/2019 at 10:09 AM  Between 7am to 6pm - Pager - [phone]  After 6pm go to [email or web address] - Proofreader  [employer] [facility]  Office  [phone]  CC: Primary care physician; Care, [facility]   Note: This dictation was prepared with Dragon dictation along with smaller phrase technology. Any transcriptional errors that result from this process are unintentional.

## 2019-04-07 NOTE — Progress Notes (Signed)
[facility]  ROUNDING NOTE   Subjective:   [name] admitted to [facility] on 04/02/2019 for Febrile illness [R50.9] Pancytopenia ([place]) [D61.818] Sepsis, due to unspecified organism, unspecified whether acute organ dysfunction present ([place]) [A41.9] Found to have cholecystitis and cholangitis. E. Coli.  On ceftriaxone.  Nephrology consulted for PICC placement.   Objective:  Vital signs in last 24 hours:  Temp:  [97.7 F (36.5 C)-98.5 F (36.9 C)] 97.7 F (36.5 C) (09/29 1143) Pulse Rate:  [62-83] 83 (09/29 1143) Resp:  [16-20] 17 (09/29 1143) BP: (106-118)/(57-73) 106/61 (09/29 1143) SpO2:  [96 %-100 %] 100 % (09/29 1143)  Weight change:  Filed Weights   04/02/19 0130 04/02/19 0431 04/06/19 0904  Weight: 61.2 kg 65.3 kg 59 kg    Intake/Output: I/O last 3 completed shifts: In: 120 [P.O.:120] Out: 1900 [Urine:1900]   Intake/Output this shift:  No intake/output data recorded.  Physical Exam: General: NAD,   Head: Normocephalic, atraumatic. Moist oral mucosal membranes  Eyes: Anicteric, PERRL  Neck: Supple, trachea midline  Lungs:  Clear to auscultation  Heart: Regular rate and rhythm  Abdomen:  Soft, nontender,   Extremities:  no peripheral edema.  Neurologic: Nonfocal, moving all four extremities  Skin: No lesions       Basic Metabolic Panel: Recent Labs  Lab 04/02/19 0516  04/03/19 0306 04/04/19 0815 04/05/19 0439 04/06/19 0435 04/07/19 0356  NA 135  --  135 134* 134* 134* 136  K 2.9*   < > 4.5 3.9 4.0 4.4 4.8  CL 105  --  107 104 107 105 106  CO2 17*  --  18* 18* 20* 21* 22  GLUCOSE 76  --  96 112* 86 87 80  BUN 47*  --  48* 50* 50* 49* 45*  CREATININE 3.45*  --  3.39* 3.44* 3.43* 3.22* 3.14*  CALCIUM 7.0*  --  7.3* 8.0* 7.8* 7.8* 7.7*  MG 1.6*  --  2.3  --  2.3  --   --   PHOS  --   --  4.5  --   --   --   --    < > = values in this interval not displayed.    Liver Function Tests: Recent Labs  Lab 04/02/19 0516 04/03/19 0306  04/05/19 0439 04/06/19 0435 04/07/19 0356  AST 177* 133* 68* 63* 53*  ALT 189* 160* 86* 69* 57*  ALKPHOS 228* 206* 189* 195* 182*  BILITOT 4.1* 4.0* 1.3* 1.1 1.0  PROT 4.5* 4.5* 4.4* 4.6* 4.4*  ALBUMIN 2.2* 2.0* 1.8* 1.9* 1.9*   Recent Labs  Lab 04/02/19 0133  LIPASE 33   No results for input(s): AMMONIA in the last 168 hours.  CBC: Recent Labs  Lab 04/02/19 0133  04/03/19 0306 04/04/19 0815 04/05/19 0439 04/06/19 0435 04/07/19 0356  WBC 2.6*   < > 7.3 4.8 3.6* 3.8* 3.1*  NEUTROABS 2.5  --   --   --   --   --   --   HGB 9.7*   < > 8.8* 10.0* 8.6* 9.0* 8.9*  HCT 29.0*   < > 26.3* 30.0* 25.8* 26.6* 27.3*  MCV 92.1   < > 91.0 92.3 89.3 90.2 92.5  PLT 73*   < > 63* 75* 68* 68* 68*   < > = values in this interval not displayed.    Cardiac Enzymes: No results for input(s): CKTOTAL, CKMB, CKMBINDEX, TROPONINI in the last 168 hours.  BNP: Invalid input(s): POCBNP  CBG: No results for input(s): GLUCAP in the last 168 hours.  Microbiology: Results for orders placed or performed during the hospital encounter of 04/02/19  Blood Culture (routine x 2)     Status: Abnormal   Collection Time: 04/02/19  1:33 AM   Specimen: BLOOD  Result Value Ref Range Status   Specimen Description   Final    BLOOD LEFT ANTECUBITAL Performed at [facility], [place] [address], [place], [place] [address]    Special Requests   Final    BOTTLES DRAWN AEROBIC AND ANAEROBIC Blood Culture adequate volume Performed at [place] [place], [place] [address]    Culture  Setup Time   Final    GRAM NEGATIVE RODS ANAEROBIC BOTTLE ONLY CRITICAL RESULT CALLED TO, READ BACK BY AND VERIFIED WITH: SHEEMA HALLAJI AT 2778 ON 04/02/19 [facility] Performed at [facility], [place] [address] [place], [place] [address]    Culture ESCHERICHIA COLI (A)  Final   Report Status 04/04/2019 FINAL  Final   Organism ID, Bacteria ESCHERICHIA COLI  Final      Susceptibility   Escherichia  coli - MIC*    AMPICILLIN 16 INTERMEDIATE Intermediate     CEFAZOLIN <=4 SENSITIVE Sensitive     CEFEPIME <=1 SENSITIVE Sensitive     CEFTAZIDIME <=1 SENSITIVE Sensitive     CEFTRIAXONE <=1 SENSITIVE Sensitive     CIPROFLOXACIN >=4 RESISTANT Resistant     GENTAMICIN <=1 SENSITIVE Sensitive     IMIPENEM 0.5 SENSITIVE Sensitive     TRIMETH/SULFA <=20 SENSITIVE Sensitive     AMPICILLIN/SULBACTAM 16 INTERMEDIATE Intermediate     PIP/TAZO 8 SENSITIVE Sensitive     Extended ESBL NEGATIVE Sensitive     * ESCHERICHIA COLI  Blood Culture (routine x 2)     Status: Abnormal   Collection Time: 04/02/19  1:33 AM   Specimen: BLOOD  Result Value Ref Range Status   Specimen Description   Final    BLOOD RIGHT ANTECUBITAL Performed at [facility], [place] [address], [place], [place] [address]    Special Requests   Final    BOTTLES DRAWN AEROBIC AND ANAEROBIC Blood Culture adequate volume Performed at [facility], [address] [place], [place] [address]    Culture  Setup Time   Final    GRAM NEGATIVE RODS CRITICAL RESULT CALLED TO, READ BACK BY AND VERIFIED WITH: CRITICAL VALUE NOTED.  VALUE IS CONSISTENT WITH PREVIOUSLY REPORTED AND CALLED VALUE. Performed at [facility], [address] [place], [place] [address]    Culture (A)  Final    ESCHERICHIA COLI SUSCEPTIBILITIES PERFORMED ON PREVIOUS CULTURE WITHIN THE LAST 5 DAYS. Performed at [facility], [place] [address], [place], [place] [address]    Report Status 04/04/2019 FINAL  Final  Urine culture     Status: None   Collection Time: 04/02/19  1:33 AM   Specimen: In/Out Cath Urine  Result Value Ref Range Status   Specimen Description   Final    IN/OUT CATH URINE Performed at [facility], [address] [place], [place] [address]    Special Requests   Final    NONE Performed at [facility], [address] [place], [place] [address]    Culture   Final    NO GROWTH Performed at  [facility], [place] [address] [place], [place] [address]    Report Status 04/03/2019 FINAL  Final  Blood Culture ID Panel (Reflexed)     Status: Abnormal   Collection Time: 04/02/19  1:33 AM  Result Value Ref Range Status   Enterococcus species NOT DETECTED NOT DETECTED Final   Listeria monocytogenes NOT DETECTED NOT DETECTED Final   Staphylococcus species NOT DETECTED NOT DETECTED Final   Staphylococcus aureus (BCID) NOT DETECTED NOT DETECTED Final   Streptococcus species NOT DETECTED NOT DETECTED Final   Streptococcus agalactiae NOT DETECTED NOT DETECTED Final   Streptococcus pneumoniae NOT DETECTED NOT DETECTED Final   Streptococcus pyogenes NOT DETECTED NOT DETECTED Final   Acinetobacter baumannii NOT DETECTED NOT DETECTED Final   Enterobacteriaceae species DETECTED (A) NOT DETECTED Final    Comment: Enterobacteriaceae represent a large family of gram-negative bacteria, not a single organism. CRITICAL RESULT CALLED TO, READ BACK BY AND VERIFIED WITH: SHEEMA HALLAJI AT 9628 ON 04/02/2019 [place].    Enterobacter cloacae complex NOT DETECTED NOT DETECTED Final   Escherichia coli DETECTED (A) NOT DETECTED Final    Comment: CRITICAL RESULT CALLED TO, READ BACK BY AND VERIFIED WITH: SHEEMA HALLAJI AT 3662 ON 04/02/19 [place].    Klebsiella oxytoca NOT DETECTED NOT DETECTED Final   Klebsiella pneumoniae NOT DETECTED NOT DETECTED Final   Proteus species NOT DETECTED NOT DETECTED Final   Serratia marcescens NOT DETECTED NOT DETECTED Final   Carbapenem resistance NOT DETECTED NOT DETECTED Final   Haemophilus influenzae NOT DETECTED NOT DETECTED Final   Neisseria meningitidis NOT DETECTED NOT DETECTED Final   Pseudomonas aeruginosa NOT DETECTED NOT DETECTED Final   Candida albicans NOT DETECTED NOT DETECTED Final   Candida glabrata NOT DETECTED NOT DETECTED Final   Candida krusei NOT DETECTED NOT DETECTED Final   Candida parapsilosis NOT DETECTED NOT DETECTED Final   Candida tropicalis NOT  DETECTED NOT DETECTED Final    Comment: Performed at [facility], [address] [place], [place] [address]  Novel Coronavirus, NAA (hospital order; send-out to ref lab)     Status: None   Collection Time: 04/06/19  1:44 PM   Specimen: Nasopharyngeal Swab; Respiratory  Result Value Ref Range Status   SARS-CoV-2, NAA NOT DETECTED NOT DETECTED Final    Comment: (NOTE) This nucleic acid amplification test was developed and its performance characteristics determined by [employer]. Nucleic acid amplification tests include PCR and TMA. This test has not been FDA cleared or approved. This test has been authorized by FDA under an Emergency Use Authorization (EUA). This test is only authorized for the duration of time the declaration that circumstances exist justifying the authorization of the emergency use of in vitro diagnostic tests for detection of SARS-CoV-2 virus and/or diagnosis of COVID-19 infection under section 564(b)(1) of the Act, 21 U.S.C. 465KPT-4(S) (1), unless the authorization is terminated or revoked sooner. When diagnostic testing is negative, the possibility of a false negative result should be considered in the context of a patient's recent exposures and the presence of clinical signs and symptoms consistent with COVID-19. An individual without symptoms of COVID- 19 and who is not shedding SARS-CoV-2 vi rus would expect to have a negative (not detected) result in this assay. Performed At: [facility] [address] [place], [place] [id number] Rush Farmer MD GY:[id number]    [place]  Final    Comment: Performed at [facility], [address] [place], [place] [address]  Culture, blood (Routine X 2) w Reflex to ID Panel     Status: None (Preliminary result)   Collection Time: 04/06/19  9:04 PM   Specimen: BLOOD  Result Value Ref Range Status   Specimen Description BLOOD RIGHT HAND  Final   Special  Requests   Final    BOTTLES DRAWN AEROBIC AND ANAEROBIC Blood Culture results may not be optimal due to an inadequate volume of blood received in culture bottles   Culture   Final    NO GROWTH < 12 HOURS Performed at [facility], [address] [place], [place] [address]    Report Status PENDING  Incomplete  Culture, blood (Routine X 2) w Reflex to ID Panel     Status: None (Preliminary result)   Collection Time: 04/06/19  9:04 PM   Specimen: BLOOD  Result Value Ref Range Status   Specimen Description BLOOD LEFT HAND  Final   Special Requests   Final    BOTTLES DRAWN AEROBIC AND ANAEROBIC Blood Culture adequate volume   Culture   Final    NO GROWTH < 12 HOURS Performed at [facility], [address] [place], [place] [address]    Report Status PENDING  Incomplete    Coagulation Studies: No results for input(s): LABPROT, INR in the last 72 hours.  Urinalysis: No results for input(s): COLORURINE, LABSPEC, PHURINE, GLUCOSEU, HGBUR, BILIRUBINUR, KETONESUR, PROTEINUR, UROBILINOGEN, NITRITE, LEUKOCYTESUR in the last 72 hours.  Invalid input(s): APPERANCEUR    Imaging: Dg C-arm 1-60 Min-no Report  Result Date: 04/06/2019 Fluoroscopy was utilized by the requesting physician.  No radiographic interpretation.   [place] Ekg Site Rite  Result Date: 04/07/2019 If Site Rite image not attached, placement could not be confirmed due to current cardiac rhythm.    Medications:   . cefTRIAXone (ROCEPHIN)  IV 2 g (04/07/19 0909)   . aspirin EC  81 mg Oral QHS  . calcitRIOL  0.25 mcg Oral Once per day on Mon Wed Fri  . cholecalciferol  5,000 Units Oral Daily  . doxazosin  4 mg Oral QHS  . feeding supplement (ENSURE ENLIVE)  237 mL Oral BID BM  . ferrous sulfate  325 mg Oral BID  . folic acid  1 mg Oral Daily  . indomethacin  100 mg Rectal Once  . influenza vaccine adjuvanted  0.5 mL Intramuscular Tomorrow-1000  . isosorbide mononitrate  30 mg Oral Daily  .  levothyroxine  88 mcg Oral Daily  . multivitamin with minerals  1 tablet Oral Daily  . omega-3 acid ethyl esters  1 g Oral Daily  . pantoprazole  40 mg Oral BID  . sodium bicarbonate  1,300 mg Oral BID  . tamsulosin  0.4 mg Oral QHS   acetaminophen  Assessment/ Plan:  [name] is a 85 y.o. white male with coronary artery disease, congestive heart failure, hypertension, hyperlipidemia, hypothyroidism, anemia admitted to [facility] on 04/02/2019 for Febrile illness [R50.9] Pancytopenia ([place]) [D61.818] Sepsis, due to unspecified organism, unspecified whether acute organ dysfunction present [facility]) [A41.9]  Underwent ERCP on 9/28 by Dr. Allen Norris with sphincertectomy and balloon. E. Coli, being treated with ceftriaxone.   1. 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  2. Hypertension and peripheral edema: with chronic diastolic dysfunction, not in exacerbation  3. Hypokalemia: from loop diuretics and sodium bicarbonate. History of RTA type IV with hyperkalemia  4. Secondary Hyperparathyroidsim:  Off calcitriol  5. Anemia of chronic kidney disease: hemoglobin 8.9  Patient may get PICC for long term antibiotics.    LOS: 5 Kaysen Sefcik 9/29/202012:13 PM

## 2019-04-07 NOTE — Progress Notes (Signed)
Kirk Darby, MD [address]  [place]  [place], [place] [address]  Main: [phone]  Fax: [phone] Pager: [phone]   Subjective: Lying comfortably in bed, no acute events overnight.  Tolerating diet well   Objective: Vital signs in last 24 hours: Vitals:   04/06/19 2003 04/07/19 0444 04/07/19 1143 04/07/19 1554  BP: (!) 118/57 110/73 106/61 (!) 108/51  Pulse: 62 74 83 79  Resp: 20 16 17    Temp: 98 F (36.7 C) 98.5 F (36.9 C) 97.7 F (36.5 C) (!) 97.5 F (36.4 C)  TempSrc: Oral Oral Oral Oral  SpO2: 96% 96% 100% 99%  Weight:      Height:       Weight change:   Intake/Output Summary (Last 24 hours) at 04/07/2019 1659 Last data filed at 04/07/2019 1438 Gross per 24 hour  Intake 0 ml  Output 800 ml  Net -800 ml     Exam: Heart:: Regular rate and rhythm or S1S2 present Lungs: normal and clear to auscultation Abdomen: soft, nontender, normal bowel sounds   Lab Results: CBC Latest Ref Rng & Units 04/07/2019 04/06/2019 04/05/2019  WBC 4.0 - 10.5 K/uL 3.1(L) 3.8(L) 3.6(L)  Hemoglobin 13.0 - 17.0 g/dL 8.9(L) 9.0(L) 8.6(L)  Hematocrit 39.0 - 52.0 % 27.3(L) 26.6(L) 25.8(L)  Platelets 150 - 400 K/uL 68(L) 68(L) 68(L)   CMP Latest Ref Rng & Units 04/07/2019 04/06/2019 04/05/2019  Glucose 70 - 99 mg/dL 80 87 86  BUN 8 - 23 mg/dL 45(H) 49(H) 50(H)  Creatinine 0.61 - 1.24 mg/dL 3.14(H) 3.22(H) 3.43(H)  Sodium 135 - 145 mmol/L 136 134(L) 134(L)  Potassium 3.5 - 5.1 mmol/L 4.8 4.4 4.0  Chloride 98 - 111 mmol/L 106 105 107  CO2 22 - 32 mmol/L 22 21(L) 20(L)  Calcium 8.9 - 10.3 mg/dL 7.7(L) 7.8(L) 7.8(L)  Total Protein 6.5 - 8.1 g/dL 4.4(L) 4.6(L) 4.4(L)  Total Bilirubin 0.3 - 1.2 mg/dL 1.0 1.1 1.3(H)  Alkaline Phos 38 - 126 U/L 182(H) 195(H) 189(H)  AST 15 - 41 U/L 53(H) 63(H) 68(H)  ALT 0 - 44 U/L 57(H) 69(H) 86(H)    Micro Results: Recent Results (from the past 240 hour(s))  SARS Coronavirus 2 [facility] order, Performed in [facility] hospital  lab) Nasopharyngeal Nasopharyngeal Swab     Status: None   Collection Time: 03/31/19  4:09 AM   Specimen: Nasopharyngeal Swab  Result Value Ref Range Status   SARS Coronavirus 2 NEGATIVE NEGATIVE Final    Comment: (NOTE) If result is NEGATIVE SARS-CoV-2 target nucleic acids are NOT DETECTED. The SARS-CoV-2 RNA is generally detectable in upper and lower  respiratory specimens during the acute phase of infection. The lowest  concentration of SARS-CoV-2 viral copies this assay can detect is 250  copies / mL. A negative result does not preclude SARS-CoV-2 infection  and should not be used as the sole basis for treatment or other  patient management decisions.  A negative result may occur with  improper specimen collection / handling, submission of specimen other  than nasopharyngeal swab, presence of viral mutation(s) within the  areas targeted by this assay, and inadequate number of viral copies  (<250 copies / mL). A negative result must be combined with clinical  observations, patient history, and epidemiological information. If result is POSITIVE SARS-CoV-2 target nucleic acids are DETECTED. The SARS-CoV-2 RNA is generally detectable in upper and lower  respiratory specimens dur ing the acute phase of infection.  Positive  results are indicative of  active infection with SARS-CoV-2.  Clinical  correlation with patient history and other diagnostic information is  necessary to determine patient infection status.  Positive results do  not rule out bacterial infection or co-infection with other viruses. If result is PRESUMPTIVE POSTIVE SARS-CoV-2 nucleic acids MAY BE PRESENT.   A presumptive positive result was obtained on the submitted specimen  and confirmed on repeat testing.  While 2019 novel coronavirus  (SARS-CoV-2) nucleic acids may be present in the submitted sample  additional confirmatory testing may be necessary for epidemiological  and / or clinical management purposes  to  differentiate between  SARS-CoV-2 and other Sarbecovirus currently known to infect humans.  If clinically indicated additional testing with an alternate test  methodology [phone]) is advised. The SARS-CoV-2 RNA is generally  detectable in upper and lower respiratory sp ecimens during the acute  phase of infection. The expected result is Negative. Fact Sheet for Patients:  StrictlyIdeas.no Fact Sheet for Healthcare Providers: BankingDealers.co.za This test is not yet approved or cleared by the [place] FDA and has been authorized for detection and/or diagnosis of SARS-CoV-2 by FDA under an Emergency Use Authorization (EUA).  This EUA will remain in effect (meaning this test can be used) for the duration of the COVID-19 declaration under Section 564(b)(1) of the Act, 21 U.S.C. section 360bbb-3(b)(1), unless the authorization is terminated or revoked sooner. Performed at [facility], [address], [place], [place] [address]   Urine culture     Status: None   Collection Time: 03/31/19 10:34 AM   Specimen: Urine, Random  Result Value Ref Range Status   Specimen Description   Final    URINE, RANDOM Performed at [facility], [address], [place], [place] [address]    Special Requests   Final    NONE Performed at [facility], [address] [place], [place] [address]    Culture   Final    NO GROWTH Performed at [facility], [place] [address], [place], [place] [address]    Report Status 04/01/2019 FINAL  Final  Blood Culture (routine x 2)     Status: Abnormal   Collection Time: 04/02/19  1:33 AM   Specimen: BLOOD  Result Value Ref Range Status   Specimen Description   Final    BLOOD LEFT ANTECUBITAL Performed at [facility], [place] [address] [place], [place] [address]    Special Requests   Final    BOTTLES DRAWN AEROBIC AND ANAEROBIC Blood Culture adequate volume Performed at  [place] [place], [place] [address]    Culture  Setup Time   Final    GRAM NEGATIVE RODS ANAEROBIC BOTTLE ONLY CRITICAL RESULT CALLED TO, READ BACK BY AND VERIFIED WITH: SHEEMA HALLAJI AT 2725 ON 04/02/19 [facility] Performed at [facility], [place] [address], [place], [place] [address]    Culture ESCHERICHIA COLI (A)  Final   Report Status 04/04/2019 FINAL  Final   Organism ID, Bacteria ESCHERICHIA COLI  Final      Susceptibility   Escherichia coli - MIC*    AMPICILLIN 16 INTERMEDIATE Intermediate     CEFAZOLIN <=4 SENSITIVE Sensitive     CEFEPIME <=1 SENSITIVE Sensitive     CEFTAZIDIME <=1 SENSITIVE Sensitive     CEFTRIAXONE <=1 SENSITIVE Sensitive     CIPROFLOXACIN >=4 RESISTANT Resistant     GENTAMICIN <=1 SENSITIVE Sensitive     IMIPENEM 0.5 SENSITIVE Sensitive  TRIMETH/SULFA <=20 SENSITIVE Sensitive     AMPICILLIN/SULBACTAM 16 INTERMEDIATE Intermediate     PIP/TAZO 8 SENSITIVE Sensitive     Extended ESBL NEGATIVE Sensitive     * ESCHERICHIA COLI  Blood Culture (routine x 2)     Status: Abnormal   Collection Time: 04/02/19  1:33 AM   Specimen: BLOOD  Result Value Ref Range Status   Specimen Description   Final    BLOOD RIGHT ANTECUBITAL Performed at [facility], [place] [address] [place], [place] [address]    Special Requests   Final    BOTTLES DRAWN AEROBIC AND ANAEROBIC Blood Culture adequate volume Performed at [facility], [address] [place], [place] [address]    Culture  Setup Time   Final    GRAM NEGATIVE RODS CRITICAL RESULT CALLED TO, READ BACK BY AND VERIFIED WITH: CRITICAL VALUE NOTED.  VALUE IS CONSISTENT WITH PREVIOUSLY REPORTED AND CALLED VALUE. Performed at [facility], [place] [place], [place] [address]    Culture (A)  Final    ESCHERICHIA COLI SUSCEPTIBILITIES PERFORMED ON PREVIOUS CULTURE WITHIN THE LAST 5 DAYS. Performed at [facility], [place] [address], [place],  [place] [address]    Report Status 04/04/2019 FINAL  Final  Urine culture     Status: None   Collection Time: 04/02/19  1:33 AM   Specimen: In/Out Cath Urine  Result Value Ref Range Status   Specimen Description   Final    IN/OUT CATH URINE Performed at [facility], [address] [place], [place] [address]    Special Requests   Final    NONE Performed at [facility], [address] [place], [place] [address]    Culture   Final    NO GROWTH Performed at [facility], [place] [address], [place],  [address]    Report Status 04/03/2019 FINAL  Final  Blood Culture ID Panel (Reflexed)     Status: Abnormal   Collection Time: 04/02/19  1:33 AM  Result Value Ref Range Status   Enterococcus species NOT DETECTED NOT DETECTED Final   Listeria monocytogenes NOT DETECTED NOT DETECTED Final   Staphylococcus species NOT DETECTED NOT DETECTED Final   Staphylococcus aureus (BCID) NOT DETECTED NOT DETECTED Final   Streptococcus species NOT DETECTED NOT DETECTED Final   Streptococcus agalactiae NOT DETECTED NOT DETECTED Final   Streptococcus pneumoniae NOT DETECTED NOT DETECTED Final   Streptococcus pyogenes NOT DETECTED NOT DETECTED Final   Acinetobacter baumannii NOT DETECTED NOT DETECTED Final   Enterobacteriaceae species DETECTED (A) NOT DETECTED Final    Comment: Enterobacteriaceae represent a large family of gram-negative bacteria, not a single organism. CRITICAL RESULT CALLED TO, READ BACK BY AND VERIFIED WITH: SHEEMA HALLAJI AT 2119 ON 04/02/2019 [place].    Enterobacter cloacae complex NOT DETECTED NOT DETECTED Final   Escherichia coli DETECTED (A) NOT DETECTED Final    Comment: CRITICAL RESULT CALLED TO, READ BACK BY AND VERIFIED WITH: SHEEMA HALLAJI AT 4174 ON 04/02/19 [place].    Klebsiella oxytoca NOT DETECTED NOT DETECTED Final   Klebsiella pneumoniae NOT DETECTED NOT DETECTED Final   Proteus species NOT DETECTED NOT DETECTED Final   Serratia marcescens NOT  DETECTED NOT DETECTED Final   Carbapenem resistance NOT DETECTED NOT DETECTED Final   Haemophilus influenzae NOT DETECTED NOT DETECTED Final   Neisseria meningitidis NOT DETECTED NOT DETECTED Final   Pseudomonas aeruginosa NOT DETECTED NOT DETECTED Final   Candida albicans  NOT DETECTED NOT DETECTED Final   Candida glabrata NOT DETECTED NOT DETECTED Final   Candida krusei NOT DETECTED NOT DETECTED Final   Candida parapsilosis NOT DETECTED NOT DETECTED Final   Candida tropicalis NOT DETECTED NOT DETECTED Final    Comment: Performed at [facility], [place] [place], [place] [address]  Novel Coronavirus, NAA (hospital order; send-out to ref lab)     Status: None   Collection Time: 04/06/19  1:44 PM   Specimen: Nasopharyngeal Swab; Respiratory  Result Value Ref Range Status   SARS-CoV-2, NAA NOT DETECTED NOT DETECTED Final    Comment: (NOTE) This nucleic acid amplification test was developed and its performance characteristics determined by [employer]. Nucleic acid amplification tests include PCR and TMA. This test has not been FDA cleared or approved. This test has been authorized by FDA under an Emergency Use Authorization (EUA). This test is only authorized for the duration of time the declaration that circumstances exist justifying the authorization of the emergency use of in vitro diagnostic tests for detection of SARS-CoV-2 virus and/or diagnosis of COVID-19 infection under section 564(b)(1) of the Act, 21 U.S.C. 604VWU-9(W) (1), unless the authorization is terminated or revoked sooner. When diagnostic testing is negative, the possibility of a false negative result should be considered in the context of a patient's recent exposures and the presence of clinical signs and symptoms consistent with COVID-19. An individual without symptoms of COVID- 19 and who is not shedding SARS-CoV-2 vi rus would expect to have a negative (not detected) result in this  assay. Performed At: [facility] [address] [place], [place] [id number] Rush Farmer MD FA:[id number]    August  Final    Comment: Performed at [facility], [place] [place], [place] [address]  Culture, blood (Routine X 2) w Reflex to ID Panel     Status: None (Preliminary result)   Collection Time: 04/06/19  9:04 PM   Specimen: BLOOD  Result Value Ref Range Status   Specimen Description BLOOD RIGHT HAND  Final   Special Requests   Final    BOTTLES DRAWN AEROBIC AND ANAEROBIC Blood Culture results may not be optimal due to an inadequate volume of blood received in culture bottles   Culture   Final    NO GROWTH < 12 HOURS Performed at [facility], [address], [place], [place] [address]    Report Status PENDING  Incomplete  Culture, blood (Routine X 2) w Reflex to ID Panel     Status: None (Preliminary result)   Collection Time: 04/06/19  9:04 PM   Specimen: BLOOD  Result Value Ref Range Status   Specimen Description BLOOD LEFT HAND  Final   Special Requests   Final    BOTTLES DRAWN AEROBIC AND ANAEROBIC Blood Culture adequate volume   Culture   Final    NO GROWTH < 12 HOURS Performed at [facility], [address], [place], [place] [address]    Report Status PENDING  Incomplete   Studies/Results: Dg C-arm 1-60 Min-no Report  Result Date: 04/06/2019 Fluoroscopy was utilized by the requesting physician.  No radiographic interpretation.   [place] Ekg Site Rite  Result Date: 04/07/2019 If Site Rite image not attached, placement could not be confirmed due to current cardiac rhythm.  Medications:  I have reviewed the patient's current medications. Prior to Admission:  Medications Prior to Admission  Medication Sig Dispense Refill Last Dose  . Acetaminophen (TYLENOL PO) Take  1-2 tablets by mouth daily as needed (pain/headache).   Past Week at Unknown time  . aspirin EC 81 MG tablet Take 81 mg by  mouth at bedtime.   Past Week at Unknown time  . atorvastatin (LIPITOR) 40 MG tablet Take 40 mg by mouth at bedtime.    Past Week at Unknown time  . calcitRIOL (ROCALTROL) 0.25 MCG capsule Take 0.25 mcg by mouth See admin instructions. Take one capsule (0.25 mcg) by mouth every Monday, Wednesday, Friday night  12 Past Week at Unknown time  . Cholecalciferol (VITAMIN D3) 5000 units TABS Take 5,000 Units by mouth daily.   Past Week at Unknown time  . doxazosin (CARDURA) 4 MG tablet Take 4 mg by mouth at bedtime.    Past Week at Unknown time  . ferrous sulfate 325 (65 FE) MG tablet Take 325 mg by mouth 2 (two) times daily.    Past Week at Unknown time  . isosorbide mononitrate (IMDUR) 30 MG 24 hr tablet Take 1 tablet (30 mg total) by mouth daily. 30 tablet 1 Past Week at Unknown time  . levothyroxine (SYNTHROID, LEVOTHROID) 88 MCG tablet Take 88 mcg by mouth daily.  1 Past Week at Unknown time  . metoprolol succinate (TOPROL-XL) 50 MG 24 hr tablet Take 50 mg by mouth daily. Take with or immediately following a meal.   Past Week at Unknown time  . Omega-3 Fatty Acids (FISH OIL) 1200 MG CAPS Take 1,200 mg by mouth 2 (two) times daily.   Past Week at Unknown time  . pantoprazole (PROTONIX) 40 MG tablet Take 1 tablet (40 mg total) by mouth 2 (two) times daily. 60 tablet 1 Past Week at Unknown time  . potassium chloride SA (K-DUR,KLOR-CON) 20 MEQ tablet Take 20 mEq by mouth 2 (two) times daily.    Past Week at Unknown time  . sodium bicarbonate 650 MG tablet Take 1,300 mg by mouth 2 (two) times daily.    Past Week at Unknown time  . tamsulosin (FLOMAX) 0.4 MG CAPS capsule Take 0.4 mg by mouth at bedtime.    Past Week at Unknown time  . torsemide (DEMADEX) 20 MG tablet Take 40 mg by mouth daily.    Past Week at Unknown time   Scheduled: . aspirin EC  81 mg Oral QHS  . calcitRIOL  0.25 mcg Oral Once per day on Mon Wed Fri  . cholecalciferol  5,000 Units Oral Daily  . doxazosin  4 mg Oral QHS  . feeding  supplement (ENSURE ENLIVE)  237 mL Oral BID BM  . ferrous sulfate  325 mg Oral BID  . folic acid  1 mg Oral Daily  . indomethacin  100 mg Rectal Once  . influenza vaccine adjuvanted  0.5 mL Intramuscular Tomorrow-1000  . isosorbide mononitrate  30 mg Oral Daily  . levothyroxine  88 mcg Oral Daily  . multivitamin with minerals  1 tablet Oral Daily  . omega-3 acid ethyl esters  1 g Oral Daily  . pantoprazole  40 mg Oral BID  . sodium bicarbonate  1,300 mg Oral BID  . sodium chloride flush  10-40 mL Intracatheter Q12H  . tamsulosin  0.4 mg Oral QHS   Continuous: . cefTRIAXone (ROCEPHIN)  IV 2 g (04/07/19 0909)   KGY:[id number], sodium chloride flush Anti-infectives (From admission, onward)   Start     Dose/Rate Route Frequency Ordered Stop   04/08/19 0000  cefTRIAXone 2 g in sodium chloride 0.9 % 100 mL  2 g Intravenous Every 24 hours 04/07/19 1004 04/12/19 2359   04/05/19 1200  cefTRIAXone (ROCEPHIN) 2 g in sodium chloride 0.9 % 100 mL IVPB     2 g 200 mL/hr over 30 Minutes Intravenous Every 24 hours 04/05/19 1010     04/04/19 1200  cefTRIAXone (ROCEPHIN) 2 g in sodium chloride 0.9 % 100 mL IVPB  Status:  Discontinued     2 g 200 mL/hr over 30 Minutes Intravenous Every 24 hours 04/04/19 0945 04/05/19 1010   04/03/19 1500  vancomycin (VANCOCIN) 500 mg in sodium chloride 0.9 % 100 mL IVPB  Status:  Discontinued     500 mg 100 mL/hr over 60 Minutes Intravenous Every 36 hours 04/02/19 0528 04/02/19 1215   04/03/19 0200  meropenem (MERREM) 500 mg in sodium chloride 0.9 % 100 mL IVPB  Status:  Discontinued     500 mg 200 mL/hr over 30 Minutes Intravenous Every 12 hours 04/02/19 1450 04/04/19 0945   04/02/19 0230  meropenem (MERREM) 1 g in sodium chloride 0.9 % 100 mL IVPB  Status:  Discontinued     1 g 200 mL/hr over 30 Minutes Intravenous Every 12 hours 04/02/19 0218 04/02/19 1450   04/02/19 0230  vancomycin (VANCOCIN) 1,500 mg in sodium chloride 0.9 % 500 mL IVPB     1,500 mg  250 mL/hr over 120 Minutes Intravenous  Once 04/02/19 0219 04/02/19 0508   04/02/19 0215  aztreonam (AZACTAM) 2 g in sodium chloride 0.9 % 100 mL IVPB  Status:  Discontinued     2 g 200 mL/hr over 30 Minutes Intravenous  Once 04/02/19 0201 04/02/19 0218   04/02/19 0215  metroNIDAZOLE (FLAGYL) IVPB 500 mg  Status:  Discontinued     500 mg 100 mL/hr over 60 Minutes Intravenous  Once 04/02/19 0201 04/02/19 0218   04/02/19 0215  vancomycin (VANCOCIN) IVPB 1000 mg/200 mL premix  Status:  Discontinued     1,000 mg 200 mL/hr over 60 Minutes Intravenous  Once 04/02/19 0201 04/02/19 0219     Scheduled Meds: . aspirin EC  81 mg Oral QHS  . calcitRIOL  0.25 mcg Oral Once per day on Mon Wed Fri  . cholecalciferol  5,000 Units Oral Daily  . doxazosin  4 mg Oral QHS  . feeding supplement (ENSURE ENLIVE)  237 mL Oral BID BM  . ferrous sulfate  325 mg Oral BID  . folic acid  1 mg Oral Daily  . indomethacin  100 mg Rectal Once  . influenza vaccine adjuvanted  0.5 mL Intramuscular Tomorrow-1000  . isosorbide mononitrate  30 mg Oral Daily  . levothyroxine  88 mcg Oral Daily  . multivitamin with minerals  1 tablet Oral Daily  . omega-3 acid ethyl esters  1 g Oral Daily  . pantoprazole  40 mg Oral BID  . sodium bicarbonate  1,300 mg Oral BID  . sodium chloride flush  10-40 mL Intracatheter Q12H  . tamsulosin  0.4 mg Oral QHS   Continuous Infusions: . cefTRIAXone (ROCEPHIN)  IV 2 g (04/07/19 0909)   PRN Meds:.acetaminophen, sodium chloride flush   Assessment: Active Problems:   Sepsis (HCC)   Malnutrition of moderate degree   Choledocholithiasis   Elevated liver enzymes   Stricture and stenosis of esophagus  ERCP on 9/28 status post biliary sphincterotomy and stone extraction T bili normalized No evidence of post ERCP pancreatitis Patient has imaging evidence of cirrhosis  Plan: Antibiotic duration per ID Follow-up with GI as outpatient  LOS: 5 days   [name] 04/07/2019,  4:59 PM

## 2019-04-07 NOTE — Progress Notes (Signed)
Spoke with ID re: PICC placement due to pending blood cultures. ID okay to placed midline on patient.

## 2019-04-07 NOTE — Progress Notes (Addendum)
Physical Therapy Treatment Patient Details Name: [name] MRN: [id number] DOB: Sep 05, 1926 Today's Date: 04/07/2019    History of Present Illness 85 y.o. male with pertinent past medical history of CAD, chronic diastolic congestive heart failure, hyperlipidemia, hypertension, prostate cancer, CKD stage IV, chronic left hydronephrosis, and chronic elevated LFTs presenting to the ED with chief complaints of fever and abdominal pain. Pt underwent ERCP 04/06/2019.  Per pt and family pt has been weak and having more and more issues with mobility over the last few weeks.    PT Comments    Patient just woken by RN at start of session, able to state his name, oriented to situation. The patient/RN stated that he is visually impaired, that he can see shadows. Pt assisted to EOB with minA/handheld assist to take medications from RN. Progressed from minA sitting balance to intermittent supervision (majority CGA) for several minutes. Sit <> stand also improved with CGA though pt pulls on RW. Ambulated ~99ft to recliner in room. minA and RW to maintain balance due to unsteadiness while ambulating. Pt requested to brush teeth/eat breakfast prior to further work with PT, PT assisted with set up and verbal step by step sequencing. Pt up in chair with all needs in reach at end of session. Current recommendation is STR due to continued level of assistance needed for functional mobility and to maximize safety and independence.    Follow Up Recommendations  SNF     Equipment Recommendations  None recommended by PT    Recommendations for Other Services       Precautions / Restrictions Precautions Precautions: Fall Restrictions Weight Bearing Restrictions: No    Mobility  Bed Mobility Overal bed mobility: Needs Assistance Bed Mobility: Supine to Sit     Supine to sit: Min assist     General bed mobility comments: Pt with improved bed mobility this session, minA handheld assist to complete  transfer, minA to maintain initial sitting balance  Transfers Overall transfer level: Needs assistance Equipment used: Rolling walker (2 wheeled) Transfers: Sit to/from Stand Sit to Stand: Min guard         General transfer comment: Pt pulled on RW but able to stand without physical assist, CGA for safety.  Ambulation/Gait Ambulation/Gait assistance: Min assist Gait Distance (Feet): 5 Feet Assistive device: Rolling walker (2 wheeled)       General Gait Details: Pt able to ambulate to recliner in room, minA to maintain balance due to unsteadiness while ambulating. Pt requested to brush teeth/eat breakfast prior to further work with PT.   Stairs             Wheelchair Mobility    Modified Rankin (Stroke Patients Only)       Balance Overall balance assessment: Needs assistance Sitting-balance support: Feet supported Sitting balance-Leahy Scale: Fair Sitting balance - Comments: Pt progressed from minA to maintain balance to supervision, cues to adjust posture to maximize independence needed                                    Cognition Arousal/Alertness: Awake/alert Behavior During Therapy: [facility] for tasks assessed/performed Overall Cognitive Status: No family/caregiver present to determine baseline cognitive functioning                                 General Comments: alert, oriented to self, situation  Exercises Other Exercises Other Exercises: Pt sat EOB for several minutes to take medication from RN, progressed from minA to supervision Other Exercises: Pt sat in recliner and brushed teeth with PT, verbal cues for sequencing, assistance with set up needed    General Comments        Pertinent Vitals/Pain Pain Assessment: No/denies pain    Home Living                      Prior Function            PT Goals (current goals can now be found in the care plan section) Progress towards PT goals: Progressing toward  goals    Frequency    Min 2X/week      PT Plan Current plan remains appropriate    Co-evaluation              AM-PAC PT "6 Clicks" Mobility   Outcome Measure  Help needed turning from your back to your side while in a flat bed without using bedrails?: A Lot Help needed moving from lying on your back to sitting on the side of a flat bed without using bedrails?: A Lot Help needed moving to and from a bed to a chair (including a wheelchair)?: A Lot Help needed standing up from a chair using your arms (e.g., wheelchair or bedside chair)?: A Little Help needed to walk in hospital room?: A Little   6 Click Score: 12    End of Session Equipment Utilized During Treatment: Gait belt Activity Tolerance: Patient tolerated treatment well Patient left: with chair alarm set;with call bell/phone within reach Nurse Communication: Mobility status(RN informed pt needed  a visually impaired call bell) PT Visit Diagnosis: Muscle weakness (generalized) (M62.81);Difficulty in walking, not elsewhere classified (R26.2);Unsteadiness on feet (R26.81)     Time: 8638-1771 PT Time Calculation (min) (ACUTE ONLY): 23 min  Charges:  $Therapeutic Exercise: 8-22 mins $Therapeutic Activity: 8-22 mins                     Lieutenant Diego PT, DPT 10:03 AM,04/07/19 [phone]

## 2019-04-07 NOTE — Progress Notes (Signed)
Per MD okay for RN to advance diet order.

## 2019-04-07 NOTE — Progress Notes (Signed)
[name]  A and O x 3. VSS. Pt tolerating diet well. No complaints of pain or nausea. IV removed intact. Pt voiced understanding of discharge instructions with no further questions. Pt discharged via EMS to [facility]. Pt discharge with midline for iv antibiotics.     Allergies as of 04/07/2019      Reactions   Ativan [lorazepam] Other (See Comments)   Hallucinations, combative   Penicillin G Other (See Comments)   Has patient had a PCN reaction causing immediate rash, facial/tongue/throat swelling, SOB or lightheadedness with hypotension: Yes Has patient had a PCN reaction causing severe rash involving mucus membranes or skin necrosis: No Has patient had a PCN reaction that required hospitalization No Has patient had a PCN reaction occurring within the last 10 years: No If all of the above answers are "NO", then may proceed with Cephalosporin use.      Medication List    TAKE these medications   aspirin EC 81 MG tablet Take 81 mg by mouth at bedtime.   atorvastatin 40 MG tablet Commonly known as: LIPITOR Take 40 mg by mouth at bedtime.   calcitRIOL 0.25 MCG capsule Commonly known as: ROCALTROL Take 0.25 mcg by mouth See admin instructions. Take one capsule (0.25 mcg) by mouth every Monday, Wednesday, Friday night   cefTRIAXone 2 g in sodium chloride 0.9 % 100 mL Inject 2 g into the vein daily for 4 days. Start taking on: April 08, 2019   doxazosin 4 MG tablet Commonly known as: CARDURA Take 4 mg by mouth at bedtime.   feeding supplement (ENSURE ENLIVE) Liqd Take 237 mLs by mouth 2 (two) times daily between meals.   ferrous sulfate 325 (65 FE) MG tablet Take 325 mg by mouth 2 (two) times daily. Notes to patient: 04/07/19   Fish Oil 1200 MG Caps Take 1,200 mg by mouth 2 (two) times daily.   folic acid 1 MG tablet Commonly known as: FOLVITE Take 1 tablet (1 mg total) by mouth daily. Start taking on: April 08, 2019   isosorbide mononitrate 30 MG 24 hr  tablet Commonly known as: IMDUR Take 1 tablet (30 mg total) by mouth daily.   levothyroxine 88 MCG tablet Commonly known as: SYNTHROID Take 88 mcg by mouth daily.   metoprolol succinate 50 MG 24 hr tablet Commonly known as: TOPROL-XL Take 50 mg by mouth daily. Take with or immediately following a meal.   pantoprazole 40 MG tablet Commonly known as: PROTONIX Take 1 tablet (40 mg total) by mouth 2 (two) times daily.   potassium chloride SA 20 MEQ tablet Commonly known as: KLOR-CON Take 20 mEq by mouth 2 (two) times daily.   sodium bicarbonate 650 MG tablet Take 1,300 mg by mouth 2 (two) times daily. Notes to patient: 04/07/19   tamsulosin 0.4 MG Caps capsule Commonly known as: FLOMAX Take 0.4 mg by mouth at bedtime. Notes to patient: 04/07/19   torsemide 20 MG tablet Commonly known as: DEMADEX Take 40 mg by mouth daily.   TYLENOL PO Take 1-2 tablets by mouth daily as needed (pain/headache).   Vitamin D3 125 MCG (5000 UT) Tabs Take 5,000 Units by mouth daily.       Vitals:   04/07/19 1143 04/07/19 1554  BP: 106/61 (!) 108/51  Pulse: 83 79  Resp: 17   Temp: 97.7 F (36.5 C) (!) 97.5 F (36.4 C)  SpO2: 100% 99%    [name]

## 2019-04-07 NOTE — Progress Notes (Signed)
Patient at this time needs a PICC line for the facility he is going to, the midline is not accepted at the facility. Nephrologist agreed to PICC line placement in epic message and plans to put in a note for confirmation. Elita Quick RN made aware and PICC team consulted.

## 2019-04-07 NOTE — Progress Notes (Addendum)
RN call report to [facility]. Report given to Angie Fava RN. RN notified pt will be going with midline for iv antibiotics.  RN call wife to notified her pt was going to be discharge in [facility].

## 2019-04-07 NOTE — TOC Transition Note (Signed)
Transition of Care [facility]) - CM/SW Discharge Note   Patient Details  Name: [name] MRN: [id number] Date of Birth: [birth year]  Transition of Care [facility]) CM/SW Contact:  Beverly Sessions, RN Phone Number: 04/07/2019, 2:04 PM   Clinical Narrative:    Patient to discharge to [facility] aware that patient will need IV antibiotics at discharge  Patient to transport by EMS Packet on chart.  Bedside RN notified    Final next level of care: [place] Barriers to Discharge: No Barriers Identified   Patient Goals and CMS Choice        Discharge Placement              Patient chooses bed at: [facility] Patient to be transferred to facility by: EMS      Discharge Plan and Services                              Date [place]: 04/03/19   Representative spoke with at [place]: [place] Interventions     Readmission Risk Interventions Readmission Risk Prevention Plan 04/03/2019  Transportation Screening Complete  Medication Review [employer]) Complete  Some recent data might be hidden

## 2019-04-08 ENCOUNTER — Encounter: Payer: Self-pay | Admitting: Gastroenterology

## 2019-04-08 DIAGNOSIS — I2510 Atherosclerotic heart disease of native coronary artery without angina pectoris: Secondary | ICD-10-CM | POA: Insufficient documentation

## 2019-04-08 DIAGNOSIS — H547 Unspecified visual loss: Secondary | ICD-10-CM | POA: Insufficient documentation

## 2019-04-08 DIAGNOSIS — D691 Qualitative platelet defects: Secondary | ICD-10-CM | POA: Insufficient documentation

## 2019-04-10 LAB — HEMOCHROMATOSIS DNA-PCR(C282Y,H63D)

## 2019-04-11 LAB — CULTURE, BLOOD (ROUTINE X 2)
Culture: NO GROWTH
Culture: NO GROWTH
Special Requests: ADEQUATE

## 2019-05-15 ENCOUNTER — Other Ambulatory Visit: Payer: Self-pay

## 2019-05-18 ENCOUNTER — Encounter: Payer: Self-pay | Admitting: Gastroenterology

## 2019-05-18 ENCOUNTER — Ambulatory Visit: Payer: Medicare Other | Admitting: Gastroenterology

## 2019-05-18 ENCOUNTER — Other Ambulatory Visit: Payer: Self-pay

## 2019-05-18 ENCOUNTER — Inpatient Hospital Stay: Payer: Medicare Other | Attending: Radiation Oncology

## 2019-05-18 DIAGNOSIS — C61 Malignant neoplasm of prostate: Secondary | ICD-10-CM | POA: Diagnosis present

## 2019-05-18 LAB — PSA: Prostatic Specific Antigen: 4.73 ng/mL — ABNORMAL HIGH (ref 0.00–4.00)

## 2019-05-22 ENCOUNTER — Other Ambulatory Visit: Payer: Self-pay

## 2019-05-25 ENCOUNTER — Encounter: Payer: Self-pay | Admitting: Radiation Oncology

## 2019-05-25 ENCOUNTER — Other Ambulatory Visit: Payer: Self-pay

## 2019-05-25 ENCOUNTER — Encounter (INDEPENDENT_AMBULATORY_CARE_PROVIDER_SITE_OTHER): Payer: Self-pay

## 2019-05-25 ENCOUNTER — Ambulatory Visit
Admission: RE | Admit: 2019-05-25 | Discharge: 2019-05-25 | Disposition: A | Discharge status: Home / Self Care | Payer: Medicare Other | Source: Ambulatory Visit | Attending: Radiation Oncology | Admitting: Radiation Oncology

## 2019-05-25 ENCOUNTER — Telehealth: Payer: Self-pay | Admitting: Gastroenterology

## 2019-05-25 ENCOUNTER — Other Ambulatory Visit: Payer: Self-pay | Admitting: *Deleted

## 2019-05-25 DIAGNOSIS — C61 Malignant neoplasm of prostate: Secondary | ICD-10-CM | POA: Diagnosis present

## 2019-05-25 DIAGNOSIS — Z923 Personal history of irradiation: Secondary | ICD-10-CM | POA: Diagnosis not present

## 2019-05-25 NOTE — Telephone Encounter (Signed)
Pt wife left vm stating they received a no show letter she states they were unaware of apt and pt was doing fine they will call us back schedule if needed

## 2019-05-25 NOTE — Progress Notes (Signed)
Radiation Oncology Follow up Note  Name: [name]   Date:   05/25/2019 MRN:  [id number] DOB: 1926-12-28    This 85 y.o. male presents to the clinic today for 14-year follow-up status post radiation therapy back in 2006 for Gleason 7 adenocarcinoma with biochemical failure.  REFERRING PROVIDER: Care, [facility]  HPI: Patient is a 85 year old male now out 14 years treated back in 2006 for Gleason 7 adenocarcinoma with IMRT treatment planning.  He underwent biochemical failure in 2015 is been treated with pulsed Lupron therapy.  His PSA is up to 4.7 from 3.3 back in November 2019 he continues to have very little symptoms no bone pain no significant increased lower urinary tract symptoms or diarrhea.Marland Kitchen  He is tolerating his pulse Lupron therapy well in the past without side effect or complaint.  Patient did have an episode of jaundice back in September had an MRI scan showing possible distal common bile bile stone.  No evidence of disease secondary to his prostate cancer was seen and I have reviewed those films.  Patient also had a CT scan showing chronic bladder outlet obstruction.  Again no evidence of adenopathy in the pelvis or evidence of local extension of his prostate cancer.  COMPLICATIONS OF TREATMENT: none  FOLLOW UP COMPLIANCE: keeps appointments   PHYSICAL EXAM:  BP (!) (P) 98/57 (BP Location: Left Arm, Patient Position: Sitting)   Pulse (P) 62   Temp (!) (P) 97.2 F (36.2 C) (Tympanic)   Resp (P) 18   Wt (P) 133 lb 3.2 oz (60.4 kg)   BMI (P) 20.25 kg/m  Elderly male wheelchair-bound in NAD.  Well-developed well-nourished patient in NAD. HEENT reveals PERLA, EOMI, discs not visualized.  Oral cavity is clear. No oral mucosal lesions are identified. Neck is clear without evidence of cervical or supraclavicular adenopathy. Lungs are clear to A&P. Cardiac examination is essentially unremarkable with regular rate and rhythm without murmur rub or thrill. Abdomen is benign with  no organomegaly or masses noted. Motor sensory and DTR levels are equal and symmetric in the upper and lower extremities. Cranial nerves II through XII are grossly intact. Proprioception is intact. No peripheral adenopathy or edema is identified. No motor or sensory levels are noted. Crude visual fields are within normal range.  RADIOLOGY RESULTS: MRI scan reviewed compatible with above-stated findings, CT scan of abdomen pelvis from September also reviewed  PLAN: Present time patient continues to do well slight increase in his PSA.  I believe another 52-month dose of Eligard and reevaluate him in 6 months with PSA would be appropriate.  Patient comprehends my treatment plan well.  I would like to take this opportunity to thank you for allowing me to participate in the care of your patient.Noreene Filbert, MD

## 2019-05-27 DIAGNOSIS — N2581 Secondary hyperparathyroidism of renal origin: Secondary | ICD-10-CM | POA: Insufficient documentation

## 2019-05-27 DIAGNOSIS — E872 Acidosis, unspecified: Secondary | ICD-10-CM | POA: Insufficient documentation

## 2019-05-27 DIAGNOSIS — D631 Anemia in chronic kidney disease: Secondary | ICD-10-CM | POA: Insufficient documentation

## 2019-05-27 DIAGNOSIS — I129 Hypertensive chronic kidney disease with stage 1 through stage 4 chronic kidney disease, or unspecified chronic kidney disease: Secondary | ICD-10-CM | POA: Insufficient documentation

## 2019-05-27 DIAGNOSIS — R809 Proteinuria, unspecified: Secondary | ICD-10-CM | POA: Insufficient documentation

## 2019-06-01 ENCOUNTER — Ambulatory Visit: Payer: Medicare Other

## 2019-06-08 ENCOUNTER — Other Ambulatory Visit: Payer: Self-pay

## 2019-06-08 ENCOUNTER — Encounter: Payer: Self-pay | Admitting: Radiation Oncology

## 2019-06-09 ENCOUNTER — Inpatient Hospital Stay: Payer: Medicare Other | Attending: Radiation Oncology

## 2019-06-09 ENCOUNTER — Other Ambulatory Visit: Payer: Self-pay

## 2019-06-09 DIAGNOSIS — Z5111 Encounter for antineoplastic chemotherapy: Secondary | ICD-10-CM | POA: Diagnosis not present

## 2019-06-09 DIAGNOSIS — C61 Malignant neoplasm of prostate: Secondary | ICD-10-CM | POA: Diagnosis present

## 2019-06-09 MED ORDER — LEUPROLIDE ACETATE (6 MONTH) 45 MG ~~LOC~~ KIT
45.0000 mg | PACK | Freq: Once | SUBCUTANEOUS | Status: AC
  Administered 2019-06-09: 12:00:00 45 mg via SUBCUTANEOUS
  Filled 2019-06-09: qty 45

## 2019-11-16 ENCOUNTER — Inpatient Hospital Stay: Payer: Medicare Other | Attending: Radiation Oncology

## 2019-11-16 ENCOUNTER — Other Ambulatory Visit: Payer: Self-pay

## 2019-11-16 DIAGNOSIS — C61 Malignant neoplasm of prostate: Secondary | ICD-10-CM

## 2019-11-16 LAB — PSA: Prostatic Specific Antigen: 0.22 ng/mL (ref 0.00–4.00)

## 2019-11-23 ENCOUNTER — Other Ambulatory Visit: Payer: Self-pay | Admitting: *Deleted

## 2019-11-23 ENCOUNTER — Other Ambulatory Visit: Payer: Self-pay

## 2019-11-23 ENCOUNTER — Ambulatory Visit
Admission: RE | Admit: 2019-11-23 | Discharge: 2019-11-23 | Disposition: A | Discharge status: Home / Self Care | Payer: Medicare Other | Source: Ambulatory Visit | Attending: Radiation Oncology | Admitting: Radiation Oncology

## 2019-11-23 VITALS — BP 113/67 | HR 60 | Temp 95.5°F | Resp 20

## 2019-11-23 DIAGNOSIS — C61 Malignant neoplasm of prostate: Secondary | ICD-10-CM | POA: Insufficient documentation

## 2019-11-23 DIAGNOSIS — Z923 Personal history of irradiation: Secondary | ICD-10-CM | POA: Insufficient documentation

## 2019-11-23 NOTE — Progress Notes (Signed)
Radiation Oncology Follow up Note  Name: [name]   Date:   11/23/2019 MRN:  [id number] DOB: [birth year]    This 86 y.o. male presents to the clinic today for follow-up status post radiation therapy back in 2006 for Gleason 7 adenocarcinoma the prostate being treated with pulse androgen deprivation therapy.  REFERRING PROVIDER: Care, [facility]  HPI: Patient is a 86 year old male now out 15 years having completed IMRT treatment for Gleason 7 adenocarcinoma.  He developed biochemical failure in 2015 and has been on pulse androgen deprivation therapy.  He is asymptomatic.  Specifically denies bone pain.  He received Eligard back in December 2020.  His most recent PSA is 8.64.  COMPLICATIONS OF TREATMENT: none  FOLLOW UP COMPLIANCE: keeps appointments   PHYSICAL EXAM:  BP 113/67 (BP Location: Left Arm, Patient Position: Sitting, Cuff Size: Normal)   Pulse 60   Temp (!) 95.5 F (35.3 C) (Tympanic)   Resp 76  Wheelchair-bound elderly male in NAD.  Well-developed well-nourished patient in NAD. HEENT reveals PERLA, EOMI, discs not visualized.  Oral cavity is clear. No oral mucosal lesions are identified. Neck is clear without evidence of cervical or supraclavicular adenopathy. Lungs are clear to A&P. Cardiac examination is essentially unremarkable with regular rate and rhythm without murmur rub or thrill. Abdomen is benign with no organomegaly or masses noted. Motor sensory and DTR levels are equal and symmetric in the upper and lower extremities. Cranial nerves II through XII are grossly intact. Proprioception is intact. No peripheral adenopathy or edema is identified. No motor or sensory levels are noted. Crude visual fields are within normal range.  RADIOLOGY RESULTS: No current films to review  PLAN: Present time I am pleased with his PSA at 0.22.  I have asked to see him back in 6 months for follow-up with a PSA at that time to monitor whether additional treatment is indicated.   Patient comprehends my recommendations well.  I would like to take this opportunity to thank you for allowing me to participate in the care of your patient.Noreene Filbert, MD

## 2020-02-26 ENCOUNTER — Encounter: Payer: Self-pay | Admitting: Radiation Oncology

## 2020-03-24 ENCOUNTER — Encounter: Payer: Self-pay | Admitting: Emergency Medicine

## 2020-03-24 ENCOUNTER — Emergency Department: Payer: Medicare Other

## 2020-03-24 ENCOUNTER — Other Ambulatory Visit (HOSPITAL_COMMUNITY)
Admission: RE | Admit: 2020-03-24 | Discharge: 2020-03-24 | Disposition: A | Discharge status: Home / Self Care | Payer: Medicare Other | Source: Ambulatory Visit | Attending: Family Medicine | Admitting: Family Medicine

## 2020-03-24 ENCOUNTER — Other Ambulatory Visit: Payer: Self-pay

## 2020-03-24 DIAGNOSIS — N19 Unspecified kidney failure: Secondary | ICD-10-CM | POA: Insufficient documentation

## 2020-03-24 DIAGNOSIS — I5032 Chronic diastolic (congestive) heart failure: Secondary | ICD-10-CM | POA: Insufficient documentation

## 2020-03-24 DIAGNOSIS — Z87891 Personal history of nicotine dependence: Secondary | ICD-10-CM | POA: Diagnosis not present

## 2020-03-24 DIAGNOSIS — E871 Hypo-osmolality and hyponatremia: Secondary | ICD-10-CM | POA: Insufficient documentation

## 2020-03-24 DIAGNOSIS — E876 Hypokalemia: Secondary | ICD-10-CM | POA: Diagnosis not present

## 2020-03-24 DIAGNOSIS — R799 Abnormal finding of blood chemistry, unspecified: Secondary | ICD-10-CM | POA: Diagnosis present

## 2020-03-24 DIAGNOSIS — R197 Diarrhea, unspecified: Secondary | ICD-10-CM | POA: Insufficient documentation

## 2020-03-24 DIAGNOSIS — Z20822 Contact with and (suspected) exposure to covid-19: Secondary | ICD-10-CM | POA: Insufficient documentation

## 2020-03-24 DIAGNOSIS — Z79899 Other long term (current) drug therapy: Secondary | ICD-10-CM | POA: Diagnosis not present

## 2020-03-24 DIAGNOSIS — Z8546 Personal history of malignant neoplasm of prostate: Secondary | ICD-10-CM | POA: Insufficient documentation

## 2020-03-24 DIAGNOSIS — N184 Chronic kidney disease, stage 4 (severe): Secondary | ICD-10-CM | POA: Insufficient documentation

## 2020-03-24 DIAGNOSIS — D649 Anemia, unspecified: Secondary | ICD-10-CM | POA: Diagnosis present

## 2020-03-24 DIAGNOSIS — Z7982 Long term (current) use of aspirin: Secondary | ICD-10-CM | POA: Insufficient documentation

## 2020-03-24 DIAGNOSIS — I130 Hypertensive heart and chronic kidney disease with heart failure and stage 1 through stage 4 chronic kidney disease, or unspecified chronic kidney disease: Secondary | ICD-10-CM | POA: Diagnosis not present

## 2020-03-24 DIAGNOSIS — I2510 Atherosclerotic heart disease of native coronary artery without angina pectoris: Secondary | ICD-10-CM | POA: Diagnosis not present

## 2020-03-24 LAB — COMPREHENSIVE METABOLIC PANEL
ALT: 20 U/L (ref 0–44)
AST: 23 U/L (ref 15–41)
Albumin: 2.7 g/dL — ABNORMAL LOW (ref 3.5–5.0)
Alkaline Phosphatase: 50 U/L (ref 38–126)
Anion gap: 10 (ref 5–15)
BUN: 34 mg/dL — ABNORMAL HIGH (ref 8–23)
CO2: 25 mmol/L (ref 22–32)
Calcium: 8.5 mg/dL — ABNORMAL LOW (ref 8.9–10.3)
Chloride: 102 mmol/L (ref 98–111)
Creatinine, Ser: 2.72 mg/dL — ABNORMAL HIGH (ref 0.61–1.24)
GFR calc Af Amer: 23 mL/min — ABNORMAL LOW (ref >60)
GFR calc non Af Amer: 20 mL/min — ABNORMAL LOW (ref >60)
Glucose, Bld: 143 mg/dL — ABNORMAL HIGH (ref 70–99)
Potassium: 2.7 mmol/L — CL (ref 3.5–5.1)
Sodium: 137 mmol/L (ref 135–145)
Total Bilirubin: 0.8 mg/dL (ref 0.3–1.2)
Total Protein: 5.5 g/dL — ABNORMAL LOW (ref 6.5–8.1)

## 2020-03-24 LAB — CBC WITH DIFFERENTIAL/PLATELET
Abs Immature Granulocytes: 0.01 10*3/uL (ref 0.00–0.07)
Basophils Absolute: 0 10*3/uL (ref 0.0–0.1)
Basophils Relative: 1 %
Eosinophils Absolute: 0.1 10*3/uL (ref 0.0–0.5)
Eosinophils Relative: 2 %
HCT: 32.2 % — ABNORMAL LOW (ref 39.0–52.0)
Hemoglobin: 10.4 g/dL — ABNORMAL LOW (ref 13.0–17.0)
Immature Granulocytes: 0 %
Lymphocytes Relative: 22 %
Lymphs Abs: 0.7 10*3/uL (ref 0.7–4.0)
MCH: 31.2 pg (ref 26.0–34.0)
MCHC: 32.3 g/dL (ref 30.0–36.0)
MCV: 96.7 fL (ref 80.0–100.0)
Monocytes Absolute: 0.2 10*3/uL (ref 0.1–1.0)
Monocytes Relative: 7 %
Neutro Abs: 2.2 10*3/uL (ref 1.7–7.7)
Neutrophils Relative %: 68 %
Platelets: 149 10*3/uL — ABNORMAL LOW (ref 150–400)
RBC: 3.33 MIL/uL — ABNORMAL LOW (ref 4.22–5.81)
RDW: 15.3 % (ref 11.5–15.5)
WBC: 3.2 10*3/uL — ABNORMAL LOW (ref 4.0–10.5)
nRBC: 0 % (ref 0.0–0.2)

## 2020-03-24 MED ORDER — LOPERAMIDE HCL 2 MG PO CAPS
4.0000 mg | ORAL_CAPSULE | Freq: Once | ORAL | Status: AC
  Administered 2020-03-24: 4 mg via ORAL
  Filled 2020-03-24: qty 2

## 2020-03-24 MED ORDER — POTASSIUM CHLORIDE CRYS ER 20 MEQ PO TBCR
40.0000 meq | EXTENDED_RELEASE_TABLET | Freq: Once | ORAL | Status: AC
  Administered 2020-03-24: 40 meq via ORAL
  Filled 2020-03-24: qty 2

## 2020-03-24 NOTE — ED Notes (Signed)
Patient given snack and water with MD Joni Fears approval

## 2020-03-24 NOTE — ED Triage Notes (Signed)
Pt daughter I law reports the patient has had diarrhea for several weeks. Pt daughter in law reports he was advised to take imodium and keep having home health come in. This am the patient had blood work done and his potassium came back at 2.7 and they were advised to come to the ED.

## 2020-03-25 ENCOUNTER — Emergency Department
Admission: EM | Admit: 2020-03-25 | Discharge: 2020-03-25 | Disposition: A | Discharge status: Home / Self Care | Payer: Medicare Other | Attending: Emergency Medicine | Admitting: Emergency Medicine

## 2020-03-25 ENCOUNTER — Other Ambulatory Visit: Payer: Self-pay

## 2020-03-25 DIAGNOSIS — R197 Diarrhea, unspecified: Secondary | ICD-10-CM | POA: Diagnosis not present

## 2020-03-25 DIAGNOSIS — E876 Hypokalemia: Secondary | ICD-10-CM

## 2020-03-25 LAB — URINALYSIS, COMPLETE (UACMP) WITH MICROSCOPIC
Bilirubin Urine: NEGATIVE
Glucose, UA: 50 mg/dL — AB
Hgb urine dipstick: NEGATIVE
Ketones, ur: NEGATIVE mg/dL
Leukocytes,Ua: NEGATIVE
Nitrite: NEGATIVE
Protein, ur: NEGATIVE mg/dL
Specific Gravity, Urine: 1.01 (ref 1.005–1.030)
pH: 7 (ref 5.0–8.0)

## 2020-03-25 LAB — CBC WITH DIFFERENTIAL/PLATELET
Abs Immature Granulocytes: 0.03 10*3/uL (ref 0.00–0.07)
Basophils Absolute: 0 10*3/uL (ref 0.0–0.1)
Basophils Relative: 0 %
Eosinophils Absolute: 0.1 10*3/uL (ref 0.0–0.5)
Eosinophils Relative: 2 %
HCT: 29.1 % — ABNORMAL LOW (ref 39.0–52.0)
Hemoglobin: 10 g/dL — ABNORMAL LOW (ref 13.0–17.0)
Immature Granulocytes: 1 %
Lymphocytes Relative: 25 %
Lymphs Abs: 1.2 10*3/uL (ref 0.7–4.0)
MCH: 31.3 pg (ref 26.0–34.0)
MCHC: 34.4 g/dL (ref 30.0–36.0)
MCV: 90.9 fL (ref 80.0–100.0)
Monocytes Absolute: 0.4 10*3/uL (ref 0.1–1.0)
Monocytes Relative: 9 %
Neutro Abs: 3 10*3/uL (ref 1.7–7.7)
Neutrophils Relative %: 63 %
Platelets: 142 10*3/uL — ABNORMAL LOW (ref 150–400)
RBC: 3.2 MIL/uL — ABNORMAL LOW (ref 4.22–5.81)
RDW: 15.1 % (ref 11.5–15.5)
WBC: 4.8 10*3/uL (ref 4.0–10.5)
nRBC: 0 % (ref 0.0–0.2)

## 2020-03-25 LAB — COMPREHENSIVE METABOLIC PANEL
ALT: 18 U/L (ref 0–44)
AST: 21 U/L (ref 15–41)
Albumin: 2.7 g/dL — ABNORMAL LOW (ref 3.5–5.0)
Alkaline Phosphatase: 52 U/L (ref 38–126)
Anion gap: 8 (ref 5–15)
BUN: 33 mg/dL — ABNORMAL HIGH (ref 8–23)
CO2: 25 mmol/L (ref 22–32)
Calcium: 8.1 mg/dL — ABNORMAL LOW (ref 8.9–10.3)
Chloride: 106 mmol/L (ref 98–111)
Creatinine, Ser: 2.58 mg/dL — ABNORMAL HIGH (ref 0.61–1.24)
GFR calc Af Amer: 24 mL/min — ABNORMAL LOW (ref >60)
GFR calc non Af Amer: 21 mL/min — ABNORMAL LOW (ref >60)
Glucose, Bld: 110 mg/dL — ABNORMAL HIGH (ref 70–99)
Potassium: 2.9 mmol/L — ABNORMAL LOW (ref 3.5–5.1)
Sodium: 139 mmol/L (ref 135–145)
Total Bilirubin: 0.6 mg/dL (ref 0.3–1.2)
Total Protein: 5.3 g/dL — ABNORMAL LOW (ref 6.5–8.1)

## 2020-03-25 LAB — SARS CORONAVIRUS 2 BY RT PCR (HOSPITAL ORDER, PERFORMED IN ~~LOC~~ HOSPITAL LAB): SARS Coronavirus 2: NEGATIVE

## 2020-03-25 LAB — LACTIC ACID, PLASMA: Lactic Acid, Venous: 1.5 mmol/L (ref 0.5–1.9)

## 2020-03-25 LAB — MAGNESIUM: Magnesium: 1.9 mg/dL (ref 1.7–2.4)

## 2020-03-25 LAB — TROPONIN I (HIGH SENSITIVITY): Troponin I (High Sensitivity): 12 ng/L (ref <18)

## 2020-03-25 MED ORDER — LACTATED RINGERS IV BOLUS
1000.0000 mL | Freq: Once | INTRAVENOUS | Status: AC
  Administered 2020-03-25: 1000 mL via INTRAVENOUS

## 2020-03-25 MED ORDER — POTASSIUM CHLORIDE 10 MEQ/100ML IV SOLN
10.0000 meq | INTRAVENOUS | Status: AC
  Administered 2020-03-25 (×2): 10 meq via INTRAVENOUS
  Filled 2020-03-25: qty 100

## 2020-03-25 MED ORDER — POTASSIUM CHLORIDE CRYS ER 20 MEQ PO TBCR
40.0000 meq | EXTENDED_RELEASE_TABLET | Freq: Once | ORAL | Status: AC
  Administered 2020-03-25: 40 meq via ORAL
  Filled 2020-03-25: qty 2

## 2020-03-25 NOTE — Discharge Instructions (Addendum)
Continue to take Imodium as prescribed. Increase your potassium to 2 pills a day like you used to be for all the days that you continue to have diarrhea. If the diarrhea stops continue the dose recommended by her nephrologist of 1 pill a day. It is very important they follow-up with your doctor in 2 days for repeat lab work. Make sure to increase oral hydration and follow-up with your primary care doctor for results of the stool culture. Return to the emergency room for chest pain, dizziness, syncope, abdominal pain, vomiting, or fever.

## 2020-03-25 NOTE — ED Provider Notes (Signed)
Patient has received full runs of IV potassium in addition to oral replacement. Repeat EKG shows improvement in QT to 483.Marland Kitchen Patient is well appearing, HDS, and tolerating PO. This is a chronic issue. No abd pain, or ongoing diarrhea here in ED despite long stay. Will go home with potassium replacement and outpt follow-up.   Duffy Bruce, MD 03/25/20 1055

## 2020-03-25 NOTE — ED Provider Notes (Signed)
[facility] Emergency Department Provider Note  ____________________________________________  Time seen: Approximately 5:51 AM  I have reviewed the triage vital signs and the nursing notes.   HISTORY  Chief Complaint Abnormal Lab and Diarrhea   HPI [name] is a 86 y.o. male with a history of CHF, hypertension, hyperlipidemia, chronic kidney disease who presents for evaluation for hypokalemia.  History is gathered mostly from patient's daughter-in-law who is here caring for him.  Patient has had diarrhea for 2 months.  Some days more than others but has had several daily episodes.  No melena, no abdominal pain, no fever or chills, no vomiting, no chest pain or shortness of breath.  Patient has been placed on Imodium by his PCP which has not really helped much.  Initially he was on potassium supplementation however his nephrologist had taken him off of it because his levels were normal just before the diarrhea started. Since the diarrhea started, he has been restarted on half the dose. Yesterday he had regular lab work done and his potassium was found to be 2.7 so he was sent to the emergency room for evaluation. Patient reports that he feels well and really has no complaints at this time. He has been taking Imodium at home. Hasn't had any diarrhea since being in the emergency department.  Past Medical History:  Diagnosis Date  . Cardiac disease   . CHF (congestive heart failure) ([place])   . Hypercholesteremia   . Hypertension   . Kidney failure    left  . Osteoporosis   . Prostate cancer ([place])   . Shingles   . Skin cancer    right cheek    Patient Active Problem List   Diagnosis Date Noted  . Acidosis 05/27/2019  . Anemia in chronic kidney disease 05/27/2019  . Benign hypertensive kidney disease with chronic kidney disease 05/27/2019  . Proteinuria 05/27/2019  . Secondary hyperparathyroidism of renal origin ([place]) 05/27/2019  . Blind 04/08/2019  .  CAD (coronary artery disease) 04/08/2019  . Thrombocytopathia ([place]) 04/08/2019  . Choledocholithiasis   . Elevated liver enzymes   . Stricture and stenosis of esophagus   . Malnutrition of moderate degree 04/03/2019  . Chronic diastolic (congestive) heart failure ([place]) 03/31/2019  . Malignant neoplasm of prostate ([place]) 03/31/2019  . Weakness   . Elevated LFTs   . Chronic kidney disease, stage 4 (severe) ([place])   . Aortic atherosclerosis ([place])   . Bilateral nephrolithiasis   . Hydronephrosis concurrent with and due to calculi of kidney and ureter   . Acute encephalopathy 03/08/2018  . Gallbladder abscess 07/31/2016  . Cholecystitis 07/20/2016  . Hematuria 06/25/2016  . Sepsis ([place]) 06/25/2016  . Acute cholecystitis   . Hypokalemia 07/23/2015  . Generalized weakness 07/23/2015  . Bladder outlet obstruction 07/23/2015  . Acute delirium 07/23/2015  . Benign essential HTN 07/23/2015  . Acute on chronic renal failure ([place]) 07/20/2015  . GIB (gastrointestinal bleeding) 06/03/2015  . Kidney stone 05/25/2012  . Ureteric stone 05/25/2012  . Hydronephrosis 05/25/2012  . Chronic kidney disease, stage IV (severe) ([place]) 05/25/2012    Past Surgical History:  Procedure Laterality Date  . CARDIAC CATHETERIZATION  04/13/2014  . CT PERC CHOLECYSTOSTOMY  07/20/2016   Placed at [facility] Interventional Radiology  . ENDOSCOPIC RETROGRADE CHOLANGIOPANCREATOGRAPHY (ERCP) WITH PROPOFOL N/A 04/06/2019   Procedure: ENDOSCOPIC RETROGRADE CHOLANGIOPANCREATOGRAPHY (ERCP) WITH PROPOFOL;  Surgeon: Lucilla Lame, MD;  Location: [facility] ENDOSCOPY;  Service: Endoscopy;  Laterality: N/A;    Prior  to Admission medications   Medication Sig Start Date End Date Taking? Authorizing Provider  Acetaminophen (TYLENOL PO) Take 1-2 tablets by mouth daily as needed (pain/headache).    [provider]  aspirin EC 81 MG tablet Take 81 mg by mouth at bedtime.    [provider]  atorvastatin (LIPITOR) 40 MG tablet  Take 40 mg by mouth at bedtime.     [provider]  calcitRIOL (ROCALTROL) 0.25 MCG capsule Take 0.25 mcg by mouth See admin instructions. Take one capsule (0.25 mcg) by mouth every Monday, Wednesday, Friday night 02/06/18   [provider]  Cholecalciferol (VITAMIN D3) 5000 units TABS Take 5,000 Units by mouth daily.    [provider]  doxazosin (CARDURA) 4 MG tablet Take 4 mg by mouth at bedtime.     [provider]  feeding supplement, ENSURE ENLIVE, (ENSURE ENLIVE) LIQD Take 237 mLs by mouth 2 (two) times daily between meals. 04/07/19   Mayo, Pete Pelt, MD  ferrous sulfate 325 (65 FE) MG tablet Take 325 mg by mouth 2 (two) times daily.     [provider]  folic acid (FOLVITE) 1 MG tablet Take 1 tablet (1 mg total) by mouth daily. 04/08/19   Mayo, Pete Pelt, MD  isosorbide mononitrate (IMDUR) 30 MG 24 hr tablet Take 1 tablet (30 mg total) by mouth daily. 03/11/18   Bloomfield, Carley D, DO  levothyroxine (SYNTHROID, LEVOTHROID) 88 MCG tablet Take 88 mcg by mouth daily. 02/11/18   [provider]  metoprolol succinate (TOPROL-XL) 50 MG 24 hr tablet Take 50 mg by mouth daily. Take with or immediately following a meal.    [provider]  Omega-3 Fatty Acids (FISH OIL) 1200 MG CAPS Take 1,200 mg by mouth 2 (two) times daily.    [provider]  pantoprazole (PROTONIX) 40 MG tablet Take 1 tablet (40 mg total) by mouth 2 (two) times daily. 03/10/18   Bloomfield, Carley D, DO  potassium chloride SA (K-DUR,KLOR-CON) 20 MEQ tablet Take 20 mEq by mouth 2 (two) times daily.  08/18/15   [provider]  sodium bicarbonate 650 MG tablet Take 1,300 mg by mouth 2 (two) times daily.     [provider]  tamsulosin (FLOMAX) 0.4 MG CAPS capsule Take 0.4 mg by mouth at bedtime.     [provider]  torsemide (DEMADEX) 20 MG tablet Take 40 mg by mouth daily.  10/27/15   [provider]    Allergies Ativan  [lorazepam], Penicillin g, Amlodipine, Lisinopril, and Spironolactone  Family History  Problem Relation Age of Onset  . CAD Mother   . Cancer Mother   . Heart attack Father     Social History Social History   Tobacco Use  . Smoking status: Former Smoker    Types: Cigarettes, Cigars  . Smokeless tobacco: Current User    Types: Chew  Vaping Use  . Vaping Use: Never used  Substance Use Topics  . Alcohol use: No    Alcohol/week: 0.0 standard drinks  . Drug use: No    Review of Systems  Constitutional: Negative for fever. Eyes: Negative for visual changes. ENT: Negative for sore throat. Neck: No neck pain  Cardiovascular: Negative for chest pain. Respiratory: Negative for shortness of breath. Gastrointestinal: Negative for abdominal pain, vomiting. + diarrhea. Genitourinary: Negative for dysuria. Musculoskeletal: Negative for back pain. Skin: Negative for rash. Neurological: Negative for headaches, weakness or numbness. Psych: No SI or HI  ____________________________________________  PHYSICAL EXAM:  VITAL SIGNS: Vitals:   03/25/20 0223 03/25/20 0615  BP: 112/63   Pulse: 67   Resp: 16 17  Temp: 98.6 F (37 C)   SpO2: 99%     Constitutional: Alert and oriented. Well appearing and in no apparent distress. HEENT:      Head: Normocephalic and atraumatic.         Eyes: Conjunctivae are normal. Sclera is non-icteric.       Mouth/Throat: Mucous membranes are moist.       Neck: Supple with no signs of meningismus. Cardiovascular: Regular rate and rhythm. No murmurs, gallops, or rubs.  Respiratory: Normal respiratory effort. Lungs are clear to auscultation bilaterally.  Gastrointestinal: Soft, non tender, and non distended with positive bowel sounds. No rebound or guarding. Musculoskeletal:  No edema, cyanosis, or erythema of extremities. Neurologic: Normal speech and language. Face is symmetric. Moving all extremities. No gross focal neurologic deficits are  appreciated. Skin: Skin is warm, dry and intact. No rash noted. Psychiatric: Mood and affect are normal. Speech and behavior are normal.  ____________________________________________   LABS (all labs ordered are listed, but only abnormal results are displayed)  Labs Reviewed  CBC WITH DIFFERENTIAL/PLATELET - Abnormal; Notable for the following components:      Result Value   RBC 3.20 (*)    Hemoglobin 10.0 (*)    HCT 29.1 (*)    Platelets 142 (*)    All other components within normal limits  COMPREHENSIVE METABOLIC PANEL - Abnormal; Notable for the following components:   Potassium 2.9 (*)    Glucose, Bld 110 (*)    BUN 33 (*)    Creatinine, Ser 2.58 (*)    Calcium 8.1 (*)    Total Protein 5.3 (*)    Albumin 2.7 (*)    GFR calc non Af Amer 21 (*)    GFR calc Af Amer 24 (*)    All other components within normal limits  URINALYSIS, COMPLETE (UACMP) WITH MICROSCOPIC - Abnormal; Notable for the following components:   Color, Urine YELLOW (*)    APPearance CLEAR (*)    Glucose, UA 50 (*)    Bacteria, UA MANY (*)    All other components within normal limits  SARS CORONAVIRUS 2 BY RT PCR (HOSPITAL ORDER, [place] [facility])  LACTIC ACID, PLASMA  MAGNESIUM  LACTIC ACID, PLASMA  TROPONIN I (HIGH SENSITIVITY)  TROPONIN I (HIGH SENSITIVITY)   ____________________________________________  EKG  ED ECG REPORT I, Rudene Re, the attending physician, personally viewed and interpreted this ECG.  Sinus rhythm with frequent PACs, right bundle branch block, borderline prolonged QTC of 504, normal axis, no ST elevations or depressions. No significant changes when compared to prior. ____________________________________________  RADIOLOGY  I have personally reviewed the images performed during this visit and I agree with the Radiologist's read.   Interpretation by Radiologist:  DG Chest 2 View  Result Date: 03/24/2020 CLINICAL DATA:  Weakness EXAM: CHEST  - 2 VIEW COMPARISON:  04/02/2019, CT 06/25/2016 FINDINGS: Hyperinflated lungs. Moderate hiatal hernia. No acute consolidation or pleural effusion. Stable cardiomediastinal silhouette. Aortic atherosclerosis. No pneumothorax. IMPRESSION: No active cardiopulmonary disease. Moderate hiatal hernia. Electronically Signed   By: Donavan Foil M.D.   On: 03/24/2020 23:39     ____________________________________________   PROCEDURES  Procedure(s) performed:yes .1-3 Lead EKG Interpretation Performed by: Rudene Re, MD Authorized by: Rudene Re, MD     Interpretation: non-specific     ECG rate assessment: normal  Rhythm: sinus rhythm     Ectopy: PAC     Critical Care performed:  None ____________________________________________   INITIAL IMPRESSION / ASSESSMENT AND PLAN / ED COURSE   86 y.o. male with a history of CHF, hypertension, hyperlipidemia, chronic kidney disease who presents for evaluation for hypokalemia in the setting of chronic diarrhea of 2 months. Patient's primary care doctor has sent a stool sample for analysis yesterday. He has been on Imodium. He is hemodynamically stable, abdomen is soft with no tenderness. Labs show no leukocytosis, improved thrombocytosis, stable mild anemia, potassium of 2.7, stable kidney function, normal magnesium. EKG stable with slightly more prolonged QTc. Patient initially given PO K in the waiting room. Waiting time of over 14 hrs so labs were repeat showing some improvement on the potassium from 2.7-2.9. Another 40 mEq orally were given and patient will receive a total of 40 mEq IV. With a 0.13 mEq/L increase in serum potassium for every 10 mEq of IV potassium this should bring his potassium level to 3.9. Will then repeat an EKG. we'll closely monitor on telemetry for any signs of dysrhythmias. If no dysrhythmias or prolongation of QTC patient will then be stable for discharge to the care of his family. Patient is on 20 mEq of potassium  daily. He used to be on 40 mEq daily. Recommended that he goes back to 40 mEq daily while the diarrhea lasts with close follow-up in 48 hours for repeat labs. Recommended returning to his nephrologist's recommended dose of 20 mEq daily once the diarrhea subsides. Recommended continuing the Imodium at home and increase oral hydration. Plan was discussed in detail with patient's daughter-in-law and patient himself. Patient placed on telemetry for close monitoring. Old medical records reviewed. If patient is able to provide a stool sample here will send for analysis. Otherwise recommended follow-up with the PCP for the results of the stool sample sent yesterday. I recommended establishment of care with a GI specialist for further evaluation of this diarrhea.  _________________________ 6:50 AM on 03/25/2020 -----------------------------------------  Care transferred to incoming Dr. Ellender Hose at [facility]     _____________________________________________ Please note:  Patient was evaluated in Emergency Department today for the symptoms described in the history of present illness. Patient was evaluated in the context of the global COVID-19 pandemic, which necessitated consideration that the patient might be at risk for infection with the SARS-CoV-2 virus that causes COVID-19. Institutional protocols and algorithms that pertain to the evaluation of patients at risk for COVID-19 are in a state of rapid change based on information released by regulatory bodies including the CDC and federal and state organizations. These policies and algorithms were followed during the patient's care in the ED.  Some ED evaluations and interventions may be delayed as a result of limited staffing during the pandemic.    Controlled Substance Database was reviewed by me. ____________________________________________   FINAL CLINICAL IMPRESSION(S) / ED DIAGNOSES   Final diagnoses:  Diarrhea, unspecified type  Hypokalemia       NEW MEDICATIONS STARTED DURING THIS VISIT:  ED Discharge Orders    None       Note:  This document was prepared using Dragon voice recognition software and may include unintentional dictation errors.    Alfred Levins, [place], MD 03/25/20 [phone]

## 2020-04-08 ENCOUNTER — Ambulatory Visit (INDEPENDENT_AMBULATORY_CARE_PROVIDER_SITE_OTHER): Payer: Medicare Other | Admitting: Family

## 2020-04-08 ENCOUNTER — Encounter: Payer: Self-pay | Admitting: Family

## 2020-04-08 ENCOUNTER — Other Ambulatory Visit: Payer: Self-pay

## 2020-04-08 VITALS — BP 110/64 | Temp 97.1°F | Resp 16 | Ht 68.0 in | Wt 125.2 lb

## 2020-04-08 DIAGNOSIS — R197 Diarrhea, unspecified: Secondary | ICD-10-CM

## 2020-04-08 DIAGNOSIS — N19 Unspecified kidney failure: Secondary | ICD-10-CM | POA: Insufficient documentation

## 2020-04-08 DIAGNOSIS — N2581 Secondary hyperparathyroidism of renal origin: Secondary | ICD-10-CM | POA: Diagnosis not present

## 2020-04-08 DIAGNOSIS — R413 Other amnesia: Secondary | ICD-10-CM

## 2020-04-08 DIAGNOSIS — I129 Hypertensive chronic kidney disease with stage 1 through stage 4 chronic kidney disease, or unspecified chronic kidney disease: Secondary | ICD-10-CM

## 2020-04-08 DIAGNOSIS — C61 Malignant neoplasm of prostate: Secondary | ICD-10-CM

## 2020-04-08 DIAGNOSIS — E876 Hypokalemia: Secondary | ICD-10-CM

## 2020-04-08 DIAGNOSIS — E785 Hyperlipidemia, unspecified: Secondary | ICD-10-CM

## 2020-04-08 DIAGNOSIS — Z789 Other specified health status: Secondary | ICD-10-CM

## 2020-04-08 DIAGNOSIS — N184 Chronic kidney disease, stage 4 (severe): Secondary | ICD-10-CM

## 2020-04-08 DIAGNOSIS — D691 Qualitative platelet defects: Secondary | ICD-10-CM

## 2020-04-08 DIAGNOSIS — H6122 Impacted cerumen, left ear: Secondary | ICD-10-CM

## 2020-04-08 DIAGNOSIS — I959 Hypotension, unspecified: Secondary | ICD-10-CM | POA: Insufficient documentation

## 2020-04-08 DIAGNOSIS — I509 Heart failure, unspecified: Secondary | ICD-10-CM | POA: Insufficient documentation

## 2020-04-08 DIAGNOSIS — L602 Onychogryphosis: Secondary | ICD-10-CM

## 2020-04-08 MED ORDER — DEBROX 6.5 % OT SOLN: 5.0000 [drp] | Freq: Two times a day (BID) | OTIC | 0 refills | Status: DC

## 2020-04-08 NOTE — Patient Instructions (Signed)
-   Debrox 6.5 % otic solution instil 5 drops twice daily into left ear x 4 days then follow up for ear lavage.

## 2020-04-08 NOTE — Progress Notes (Signed)
Provider: Lehi Phifer FNP-C   Martin Majestic, FNP  Patient Care Team: Martin Majestic, FNP as PCP - General (Family Medicine) Dionisio David, MD as Consulting Physician (Cardiology) Lavonia Dana, MD as Consulting Physician (Nephrology) Noreene Filbert, MD as Referring Physician (Radiation Oncology)  Extended Emergency Contact Information Primary Emergency Contact: Gloria,Ruth Address: [address] [place] [address] [place] Phone: [phone] Mobile Phone: [phone] Relation: Spouse Secondary Emergency Contact: Devra Dopp Address: [address]          [place], [place] [address] [place] Phone: [phone] Relation: Relative  Code Status: Full Code  Goals of care: Advanced Directive information Advanced Directives 04/08/2020  Does Patient Have a Medical Advance Directive? No  Type of Advance Directive -  Does patient want to make changes to medical advance directive? -  Copy of [place] in Chart? -  Would patient like information on creating a medical advance directive? No - Patient declined     Chief Complaint  Patient presents with  . Establish Care    New Patient.    HPI:  Pt is a 86 y.o. male seen today to establish care for medical management of chronic diseases.He has a medical history of Hypertension,Chronc diastolic congestive heart failure,Peripheral edema,CAD,chronic Kidney disease stage V follows with Nephrologist at [facility] in [place] with Dr.Kolluru Judson Roch last seen 04/06/2020, anemia of chronic Kidney disease,secondary Hyperparathyroidism,BPH,Proteinuria,Right Bundle Branch block among other conditions.He is here with daughter Devra Dopp who provides additional HPI information.  Has had diarrhea since August,2021 on and off.Last episode of diarrhea was last week on Wednesday 04/06/2020.He describe stool as loose cannot make it to the bathroom.No  abdominal pain,crampiting,nausea,vomiting,fever or chills associated with diarrhea.He takes imodium whenever he has diarrhea.His previous PCP ordered stool 03/24/2020 for ova and cyst results were negative.  He was seen in the ED on 03/25/2020 for hypokalemia and diarrhea x 2 days.Potassium was 2.7 with Nephrologist and was send to ED.He was on Potassium chloride 40 meq but was reduced by Nephrologist prior to his diarrhea.His EKG showed sinus rhythm with frequent PAC's,right bundle branch  block,borderline prolonged QTC of 504,normal axis,no ST elevation or depressions without any changes compared to previous.His K+ was replaced with 40 Meq I.V potassium chloride.He was advised to resume his Potassium 40 Meq tablet daily until diarrhea stop then resume 20 meq tablet daily as prescribed by Nephrologist.He was also encouraged to continue on imodium and hydration and to establish with GI .Needs referral to GI    Past Medical History:  Diagnosis Date  . Anemia   . Cardiac disease   . CHF (congestive heart failure) ([place])   . History of blindness   . History of diarrhea   . History of gallstones   . Hypercholesteremia   . Hypertension   . Kidney failure    left  . Osteoporosis   . Prostate cancer ([place])   . Shingles   . Skin cancer    right cheek   Past Surgical History:  Procedure Laterality Date  . CARDIAC CATHETERIZATION  04/13/2014  . CT PERC CHOLECYSTOSTOMY  07/20/2016   Placed at [facility] Interventional Radiology  . ENDOSCOPIC RETROGRADE CHOLANGIOPANCREATOGRAPHY (ERCP) WITH PROPOFOL N/A 04/06/2019   Procedure: ENDOSCOPIC RETROGRADE CHOLANGIOPANCREATOGRAPHY (ERCP) WITH PROPOFOL;  Surgeon: Lucilla Lame, MD;  Location: [facility] ENDOSCOPY;  Service: Endoscopy;  Laterality: N/A;    Allergies  Allergen Reactions  .  Ativan [Lorazepam] Other (See Comments)    Hallucinations, combative  . Penicillin G Other (See Comments)    Has patient had a PCN reaction causing immediate rash, facial/tongue/throat  swelling, SOB or lightheadedness with hypotension: Yes Has patient had a PCN reaction causing severe rash involving mucus membranes or skin necrosis: No Has patient had a PCN reaction that required hospitalization No Has patient had a PCN reaction occurring within the last 10 years: No If all of the above answers are "NO", then may proceed with Cephalosporin use.   . Amlodipine Swelling  . Lisinopril Other (See Comments)  . Spironolactone Other (See Comments)    Allergies as of 04/08/2020      Reactions   Ativan [lorazepam] Other (See Comments)   Hallucinations, combative   Penicillin G Other (See Comments)   Has patient had a PCN reaction causing immediate rash, facial/tongue/throat swelling, SOB or lightheadedness with hypotension: Yes Has patient had a PCN reaction causing severe rash involving mucus membranes or skin necrosis: No Has patient had a PCN reaction that required hospitalization No Has patient had a PCN reaction occurring within the last 10 years: No If all of the above answers are "NO", then may proceed with Cephalosporin use.   Amlodipine Swelling   Lisinopril Other (See Comments)   Spironolactone Other (See Comments)      Medication List       Accurate as of April 08, 2020 11:34 AM. If you have any questions, ask your nurse or doctor.        STOP taking these medications   calcitRIOL 0.25 MCG capsule Commonly known as: ROCALTROL Stopped by: Sandrea Hughs, NP   feeding supplement (ENSURE ENLIVE) Liqd Stopped by: Sandrea Hughs, NP   folic acid 1 MG tablet Commonly known as: FOLVITE Stopped by: Sandrea Hughs, NP   TYLENOL PO Stopped by: Sandrea Hughs, NP     TAKE these medications   aspirin EC 81 MG tablet Take 81 mg by mouth at bedtime.   atorvastatin 40 MG tablet Commonly known as: LIPITOR Take 40 mg by mouth at bedtime.   doxazosin 4 MG tablet Commonly known as: CARDURA Take 4 mg by mouth at bedtime.   ferrous sulfate 325 (65 FE)  MG tablet Take 325 mg by mouth 2 (two) times daily.   Fish Oil 1200 MG Caps Take 1,200 mg by mouth 2 (two) times daily.   isosorbide mononitrate 30 MG 24 hr tablet Commonly known as: IMDUR Take 1 tablet (30 mg total) by mouth daily.   levothyroxine 88 MCG tablet Commonly known as: SYNTHROID Take 88 mcg by mouth daily.   metoprolol succinate 50 MG 24 hr tablet Commonly known as: TOPROL-XL Take 50 mg by mouth daily. Take with or immediately following a meal.   pantoprazole 40 MG tablet Commonly known as: PROTONIX Take 40 mg by mouth daily. What changed: Another medication with the same name was removed. Continue taking this medication, and follow the directions you see here. Changed by: Sandrea Hughs, NP   potassium chloride SA 20 MEQ tablet Commonly known as: KLOR-CON Take 20 mEq by mouth 2 (two) times daily as needed.   sodium bicarbonate 650 MG tablet Take 1,300 mg by mouth 2 (two) times daily.   tamsulosin 0.4 MG Caps capsule Commonly known as: FLOMAX Take 0.4 mg by mouth at bedtime.   torsemide 20 MG tablet Commonly known as: DEMADEX Take 40 mg by mouth in the morning and at bedtime.  Vitamin D3 125 MCG (5000 UT) Tabs Take 5,000 Units by mouth daily.       Review of Systems  Constitutional: Positive for unexpected weight change. Negative for appetite change, chills, fatigue and fever.       Weight loss   HENT: Positive for hearing loss. Negative for congestion, rhinorrhea, sinus pressure, sinus pain, sneezing, sore throat and trouble swallowing.   Eyes: Positive for visual disturbance. Negative for pain, discharge, redness and itching.       Blindness does not follow up with Ophthalmology.Used to wear eye glasses in the past but not now   Respiratory: Negative for cough, chest tightness, shortness of breath and wheezing.   Cardiovascular: Negative for chest pain, palpitations and leg swelling.  Gastrointestinal: Negative for abdominal distention, abdominal  pain, constipation, nausea and vomiting.  Endocrine: Negative for cold intolerance, heat intolerance, polydipsia, polyphagia and polyuria.  Genitourinary: Positive for frequency. Negative for difficulty urinating, dysuria, flank pain and urgency.  Musculoskeletal: Positive for arthralgias, back pain and gait problem. Negative for joint swelling and myalgias.  Skin: Negative for color change, pallor and rash.  Neurological: Negative for dizziness, speech difficulty, weakness, light-headedness, numbness and headaches.  Hematological: Does not bruise/bleed easily.  Psychiatric/Behavioral: Positive for confusion. Negative for agitation, behavioral problems and sleep disturbance. The patient is not nervous/anxious.        Forgetful  Sees people that are not there especially in the evening     There is no immunization history for the selected administration types on file for this patient. Pertinent  Health Maintenance Due  Topic Date Due  . PNA vac Low Risk Adult (1 of 2 - PCV13) Never done  . INFLUENZA VACCINE  Never done   Fall Risk  04/08/2020 11/20/2017 11/21/2016 05/23/2016 11/24/2015  Falls in the past year? 0 No No No No  Number falls in past yr: 0 - - - -  Injury with Fall? 0 - - - -    Vitals:   04/08/20 1050  BP: 110/64  Resp: 16  Temp: (!) 97.1 F (36.2 C)  Weight: 125 lb 3.2 oz (56.8 kg)  Height: '5\' 8"'  (1.727 m)   Body mass index is 19.04 kg/m. Physical Exam Vitals reviewed.  Constitutional:      General: He is not in acute distress.    Appearance: He is normal weight. He is not ill-appearing.  HENT:     Head: Normocephalic.     Right Ear: Tympanic membrane, ear canal and external ear normal. There is no impacted cerumen.     Left Ear: Tympanic membrane, ear canal and external ear normal. There is no impacted cerumen.     Nose: Nose normal. No congestion or rhinorrhea.     Mouth/Throat:     Mouth: Mucous membranes are moist.     Pharynx: Oropharynx is clear. No  oropharyngeal exudate or posterior oropharyngeal erythema.  Eyes:     General: No scleral icterus.       Right eye: No discharge.        Left eye: No discharge.     Conjunctiva/sclera: Conjunctivae normal.     Comments: Blind   Neck:     Vascular: No carotid bruit.  Cardiovascular:     Rate and Rhythm: Normal rate and regular rhythm.     Pulses: Normal pulses.     Heart sounds: Normal heart sounds. No murmur heard.  No friction rub. No gallop.   Pulmonary:     Effort: Pulmonary effort  is normal. No respiratory distress.     Breath sounds: Normal breath sounds. No wheezing, rhonchi or rales.  Chest:     Chest wall: No tenderness.  Abdominal:     General: Bowel sounds are normal. There is no distension.     Palpations: Abdomen is soft. There is no mass.     Tenderness: There is no abdominal tenderness. There is no right CVA tenderness, left CVA tenderness, guarding or rebound.  Musculoskeletal:        General: No swelling.     Cervical back: Normal range of motion. No rigidity or tenderness.     Right lower leg: No edema.     Left lower leg: No edema.     Comments: Unsteady gait   Lymphadenopathy:     Cervical: No cervical adenopathy.  Skin:    General: Skin is warm and dry.     Coloration: Skin is not pale.     Findings: No bruising, erythema or rash.  Neurological:     Mental Status: He is alert.     Cranial Nerves: No cranial nerve deficit.     Sensory: No sensory deficit.     Motor: No weakness.     Coordination: Coordination normal.     Gait: Gait abnormal.  Psychiatric:        Mood and Affect: Mood normal.        Behavior: Behavior normal.        Thought Content: Thought content normal.        Judgment: Judgment normal.     Labs reviewed: Recent Labs    03/24/20 1235 03/25/20 0114  NA 137 139  K 2.7* 2.9*  CL 102 106  CO2 25 25  GLUCOSE 143* 110*  BUN 34* 33*  CREATININE 2.72* 2.58*  CALCIUM 8.5* 8.1*  MG  --  1.9   Recent Labs    03/24/20 1235  03/25/20 0114  AST 23 21  ALT 20 18  ALKPHOS 50 52  BILITOT 0.8 0.6  PROT 5.5* 5.3*  ALBUMIN 2.7* 2.7*   Recent Labs    03/24/20 1235 03/25/20 0114  WBC 3.2* 4.8  NEUTROABS 2.2 3.0  HGB 10.4* 10.0*  HCT 32.2* 29.1*  MCV 96.7 90.9  PLT 149* 142*   Lab Results  Component Value Date   TSH 0.300 (L) 03/08/2018   Lab Results  Component Value Date   HGBA1C 5.4 06/25/2016   Lab Results  Component Value Date   CHOL 122 12/02/2010   HDL 42 12/02/2010   LDLCALC  12/02/2010    51        Total Cholesterol/HDL:CHD Risk Coronary Heart Disease Risk Table                     Men   Women  1/2 Average Risk   3.4   3.3  Average Risk       5.0   4.4  2 X Average Risk   9.6   7.1  3 X Average Risk  23.4   11.0        Use the calculated Patient Ratio above and the CHD Risk Table to determine the patient's CHD Risk.        ATP III CLASSIFICATION (LDL):  <100     mg/dL   Optimal  100-129  mg/dL   Near or Above  Optimal  130-159  mg/dL   Borderline  160-189  mg/dL   High  >190     mg/dL   Very High   TRIG 146 12/02/2010   CHOLHDL 2.9 12/02/2010    Significant Diagnostic Results in last 30 days:  DG Chest 2 View  Result Date: 03/24/2020 CLINICAL DATA:  Weakness EXAM: CHEST - 2 VIEW COMPARISON:  04/02/2019, CT 06/25/2016 FINDINGS: Hyperinflated lungs. Moderate hiatal hernia. No acute consolidation or pleural effusion. Stable cardiomediastinal silhouette. Aortic atherosclerosis. No pneumothorax. IMPRESSION: No active cardiopulmonary disease. Moderate hiatal hernia. Electronically Signed   By: Donavan Foil M.D.   On: 03/24/2020 23:39    Assessment/Plan 1. Benign hypertensive kidney disease with chronic kidney disease stage I through stage IV, or unspecified B/p well controlled.No home readings for evaluation. - continue on Imdur,torsemide,doxazosin and Metoprolol.   - CBC with Differential/Platelet - CMP with eGFR([facility]) - TSH  2. Secondary  hyperparathyroidism of renal origin ([place]) Latest PTH,calcium and Phosphorous at goal. - continue on vitamin D3. - continue to follow up with Nephrologist Dr. Juleen China  - TSH  3. Chronic kidney disease, stage IV (severe) (HCC) Latest CR 2.58 at baseline - Avoid nephrotoxins and dose all other medication for renal clearance  - continue to follow up with Nephrologist Dr.Kolluru   4. Memory loss - Progressive decline with his advance age. - continue with supportive care. - Vitamin B12  5. Hyperlipidemia LDL goal <100 Latest LDL in chart at goal - Lipid Panel  6. Malignant neoplasm of prostate (HCC) PSA 16.30  Continue to monitor   7. Impacted cerumen of left ear TM not visualized due to impacted hard cerumen.Advised to instil debrox 6.5 % as below then follow up for ear lavage.  - carbamide peroxide (DEBROX) 6.5 % OTIC solution; Place 5 drops into the left ear 2 (two) times daily.  Dispense: 15 mL; Refill: 0  8. Full code status Desires to be resuscitated with full scope of treatment.  - Full code  9. Overgrown toenails Will refer to podiatrist to trim toenail. - Ambulatory referral to Podiatry  10. Diarrhea, unspecified type Ongoing since August,2021.Negative abdominal exam.status post ED due to diarrhea. - continue on Imodium  - Ambulatory referral to Gastroenterology  11. Hypokalemia Status post replacement in ED. - continue on Potassium chloride as directed from ED and Nephrologist.  - CMP with eGFR([facility])  12. Thrombocytopathia ([place]) Recent Platelets 149; 142  -  CBC with Differential/Platelet  Family/ staff Communication: Reviewed plan of care with patient and Daughter   Labs/tests ordered:  - CBC with Differential/Platelet - CMP with eGFR([facility]) - TSH - Lipid Panel - Vitamin B12  Next Appointment : 6 months for medical management of chronic issues.5 days for left ear lavage.  Sandrea Hughs, NP

## 2020-04-11 ENCOUNTER — Encounter: Payer: Self-pay | Admitting: Radiation Oncology

## 2020-04-20 ENCOUNTER — Encounter: Payer: Self-pay | Admitting: Internal Medicine

## 2020-04-29 ENCOUNTER — Other Ambulatory Visit: Payer: Self-pay

## 2020-04-29 ENCOUNTER — Ambulatory Visit (INDEPENDENT_AMBULATORY_CARE_PROVIDER_SITE_OTHER): Payer: Medicare Other | Admitting: Podiatry

## 2020-04-29 DIAGNOSIS — M79674 Pain in right toe(s): Secondary | ICD-10-CM | POA: Diagnosis not present

## 2020-04-29 DIAGNOSIS — B351 Tinea unguium: Secondary | ICD-10-CM | POA: Diagnosis not present

## 2020-04-29 DIAGNOSIS — M79675 Pain in left toe(s): Secondary | ICD-10-CM

## 2020-05-03 ENCOUNTER — Encounter: Payer: Self-pay | Admitting: Podiatry

## 2020-05-03 NOTE — Progress Notes (Signed)
  Subjective:  Patient ID: [name], male    DOB: [birth year],  MRN: [id number]  Chief Complaint  Patient presents with  . routine foot care    nail trim    86 y.o. male returns for the above complaint.  Patient presents with thickened elongated dystrophic toenails x10.  Mild pain on palpation.  He would like to have them debrided down.  He denies any other acute complaints.  He is not a diabetic.  Objective:  There were no vitals filed for this visit. Podiatric Exam: Vascular: dorsalis pedis and posterior tibial pulses are palpable bilateral. Capillary return is immediate. Temperature gradient is WNL. Skin turgor WNL  Sensorium: Normal Semmes Weinstein monofilament test. Normal tactile sensation bilaterally. Nail Exam: Pt has thick disfigured discolored nails with subungual debris noted bilateral entire nail hallux through fifth toenails.  Pain on palpation to the nails. Ulcer Exam: There is no evidence of ulcer or pre-ulcerative changes or infection. Orthopedic Exam: Muscle tone and strength are WNL. No limitations in general ROM. No crepitus or effusions noted. HAV  B/L.  Hammer toes 2-5  B/L. Skin: No Porokeratosis. No infection or ulcers    Assessment & Plan:  No diagnosis found.  Patient was evaluated and treated and all questions answered.  Onychomycosis with pain  -Nails palliatively debrided as below. -Educated on self-care  Procedure: Nail Debridement Rationale: pain  Type of Debridement: manual, sharp debridement. Instrumentation: Nail nipper, rotary burr. Number of Nails: 10  Procedures and Treatment: Consent by patient was obtained for treatment procedures. The patient understood the discussion of treatment and procedures well. All questions were answered thoroughly reviewed. Debridement of mycotic and hypertrophic toenails, 1 through 5 bilateral and clearing of subungual debris. No ulceration, no infection noted.  Return Visit-Office Procedure: Patient  instructed to return to the office for a follow up visit 3 months for continued evaluation and treatment.  Boneta Lucks, DPM    No follow-ups on file.

## 2020-05-18 ENCOUNTER — Other Ambulatory Visit: Payer: Medicare Other

## 2020-05-19 ENCOUNTER — Inpatient Hospital Stay: Payer: Medicare Other

## 2020-05-19 ENCOUNTER — Encounter: Payer: Self-pay | Admitting: Radiation Oncology

## 2020-05-19 ENCOUNTER — Other Ambulatory Visit: Payer: Medicare Other

## 2020-05-24 ENCOUNTER — Other Ambulatory Visit: Payer: Self-pay

## 2020-05-24 ENCOUNTER — Inpatient Hospital Stay: Payer: Medicare Other | Attending: Radiation Oncology

## 2020-05-24 DIAGNOSIS — C61 Malignant neoplasm of prostate: Secondary | ICD-10-CM | POA: Diagnosis present

## 2020-05-24 LAB — PSA: Prostatic Specific Antigen: 0.9 ng/mL (ref 0.00–4.00)

## 2020-05-26 ENCOUNTER — Other Ambulatory Visit: Payer: Self-pay

## 2020-05-26 ENCOUNTER — Ambulatory Visit
Admission: RE | Admit: 2020-05-26 | Discharge: 2020-05-26 | Disposition: A | Discharge status: Home / Self Care | Payer: Medicare Other | Source: Ambulatory Visit | Attending: Radiation Oncology | Admitting: Radiation Oncology

## 2020-05-26 ENCOUNTER — Other Ambulatory Visit: Payer: Self-pay | Admitting: *Deleted

## 2020-05-26 VITALS — BP 87/67 | HR 64 | Temp 96.9°F | Wt 129.8 lb

## 2020-05-26 DIAGNOSIS — C61 Malignant neoplasm of prostate: Secondary | ICD-10-CM

## 2020-05-26 NOTE — Progress Notes (Signed)
Radiation Oncology Follow up Note  Name: [name]   Date:   05/26/2020 MRN:  [id number] DOB: 1927/02/25    This 86 y.o. male presents to the clinic today for 65-month follow-up and patient were tracking for progressive PSA and patient treated back in 2015 for Gleason 7 adenocarcinoma.  REFERRING PROVIDER: Care, [facility]  HPI: Patient is a 86 year old male we have been tracking for rise in his PSA now out close to 16 years having completed IMRT radiation therapy for Gleason 7 adenocarcinoma.  He developed biochemical failure in 2015.  His most recent PSA is slightly up.  From 0.26 months ago to 0.9.  We have been treating him with pulse androgen deprivation therapy.  He specifically denies any increased lower urinary tract symptoms or bone pain.  COMPLICATIONS OF TREATMENT: none  FOLLOW UP COMPLIANCE: keeps appointments   PHYSICAL EXAM:  BP (!) 87/67   Pulse 64   Temp (!) 96.9 F (36.1 C) (Tympanic)   Wt 129 lb 12.8 oz (58.9 kg)   BMI 19.74 kg/m  Elderly male in NAD.  Well-developed well-nourished patient in NAD. HEENT reveals PERLA, EOMI, discs not visualized.  Oral cavity is clear. No oral mucosal lesions are identified. Neck is clear without evidence of cervical or supraclavicular adenopathy. Lungs are clear to A&P. Cardiac examination is essentially unremarkable with regular rate and rhythm without murmur rub or thrill. Abdomen is benign with no organomegaly or masses noted. Motor sensory and DTR levels are equal and symmetric in the upper and lower extremities. Cranial nerves II through XII are grossly intact. Proprioception is intact. No peripheral adenopathy or edema is identified. No motor or sensory levels are noted. Crude visual fields are within normal range.  RADIOLOGY RESULTS: No current films for review  PLAN: At this time I am pleased he has a very slight uptake in his PSA 2.9.  I believe I can reevaluate him in 1 year with a PSA at that time.  Family knows to  call with any concerns.  I would like to take this opportunity to thank you for allowing me to participate in the care of your patient.Noreene Filbert, MD

## 2020-05-31 ENCOUNTER — Encounter: Payer: Self-pay | Admitting: *Deleted

## 2020-06-06 ENCOUNTER — Ambulatory Visit (INDEPENDENT_AMBULATORY_CARE_PROVIDER_SITE_OTHER): Payer: Medicare Other | Admitting: Internal Medicine

## 2020-06-06 ENCOUNTER — Encounter: Payer: Self-pay | Admitting: Internal Medicine

## 2020-06-06 VITALS — BP 122/58 | HR 82 | Wt 131.0 lb

## 2020-06-06 DIAGNOSIS — R197 Diarrhea, unspecified: Secondary | ICD-10-CM

## 2020-06-06 DIAGNOSIS — K8020 Calculus of gallbladder without cholecystitis without obstruction: Secondary | ICD-10-CM

## 2020-06-06 DIAGNOSIS — K219 Gastro-esophageal reflux disease without esophagitis: Secondary | ICD-10-CM

## 2020-06-06 DIAGNOSIS — K222 Esophageal obstruction: Secondary | ICD-10-CM | POA: Diagnosis not present

## 2020-06-06 DIAGNOSIS — K449 Diaphragmatic hernia without obstruction or gangrene: Secondary | ICD-10-CM

## 2020-06-06 NOTE — Patient Instructions (Signed)
Please follow up as needed 

## 2020-06-06 NOTE — Progress Notes (Signed)
Patient ID: [name], male   DOB: Sep 08, 1926, 86 y.o.   MRN: [id number] HPI: [name] is a 86 year old male with a past medical history of hiatal hernia with esophageal stricture, gallstones and prior choledocholithiasis status post ERCP with stone extraction in September 2020, history of prostate cancer, chronic kidney disease, CHF, chronic anemia who is seen to evaluate diarrhea.  He is here today with his daughter-in-law.  He reports and with history also provided by his daughter-in-law that several months ago when the referral was made for this appointment he was having 2 to 3 months of significant diarrhea.  This was 3-4 times per day and occasionally 1-2 times at night.  Stools were urgent and at times hard to control.  They were never bloody or melenic.  He was having lower abdominal discomfort around that time.  At this point all symptoms have resolved entirely.  Bowel movements have returned to 1 sometimes 2/day which are more solid and formed.  No further urgency.  Stools are dark as he takes oral iron but no visible blood or melena.  No further abdominal pain.  He feels well.  Appetite is good.  Of note there were medication changes and potassium derangements requiring supplementation.  His family feels that his diarrhea may have been related to medication changes.  He does take pantoprazole 40 mg daily but denies ongoing issues with heartburn and dysphagia.  He does not recall ever having had a colonoscopy.  On review of records it appears that he had at least an upper endoscopy in March 2014 as I see stomach biopsies which were performed.  It showed antral mucosa with mild chronic inactive gastritis.  No atrophy or metaplasia.  Negative for H. pylori.  Past Medical History:  Diagnosis Date   Anemia    Cardiac disease    CHF (congestive heart failure) (HCC)    Choledocholithiasis    Chronic kidney disease (CKD), stage IV (severe) (HCC)    left   Elevated LFTs     Esophageal stenosis    Hiatal hernia 04/03/2019   large   History of blindness    History of diarrhea    History of gallstones    Hypercholesteremia    Hypertension    Hypothyroidism    Malnutrition (HCC)    Osteoporosis    Prostate cancer ([place])    Sepsis ([place])    Shingles    Shingles    Skin cancer    right cheek    Past Surgical History:  Procedure Laterality Date   CARDIAC CATHETERIZATION  04/13/2014   CT PERC CHOLECYSTOSTOMY  07/20/2016   Placed at [facility] Interventional Radiology   ENDOSCOPIC RETROGRADE CHOLANGIOPANCREATOGRAPHY (ERCP) WITH PROPOFOL N/A 04/06/2019   Procedure: ENDOSCOPIC RETROGRADE CHOLANGIOPANCREATOGRAPHY (ERCP) WITH PROPOFOL;  Surgeon: Lucilla Lame, MD;  Location: [facility] ENDOSCOPY;  Service: Endoscopy;  Laterality: N/A;    Outpatient Medications Prior to Visit  Medication Sig Dispense Refill   aspirin EC 81 MG tablet Take 81 mg by mouth at bedtime.     atorvastatin (LIPITOR) 40 MG tablet Take 40 mg by mouth at bedtime.      carbamide peroxide (DEBROX) 6.5 % OTIC solution Place 5 drops into the left ear 2 (two) times daily. 15 mL 0   Cholecalciferol (VITAMIN D3) 5000 units TABS Take 5,000 Units by mouth daily.     doxazosin (CARDURA) 4 MG tablet Take 4 mg by mouth at bedtime.      ferrous sulfate 325 (65 FE) MG  tablet Take 325 mg by mouth 2 (two) times daily.      isosorbide mononitrate (IMDUR) 30 MG 24 hr tablet Take 1 tablet (30 mg total) by mouth daily. 30 tablet 1   levothyroxine (SYNTHROID, LEVOTHROID) 88 MCG tablet Take 88 mcg by mouth daily.  1   loperamide (IMODIUM) 2 MG capsule Take 2 mg by mouth as needed for diarrhea or loose stools.     metoprolol succinate (TOPROL-XL) 50 MG 24 hr tablet Take 50 mg by mouth daily. Take with or immediately following a meal.     Omega-3 Fatty Acids (FISH OIL) 1200 MG CAPS Take 1,200 mg by mouth 2 (two) times daily.     pantoprazole (PROTONIX) 40 MG tablet Take 40 mg by mouth daily.      potassium chloride SA (K-DUR,KLOR-CON) 20 MEQ tablet Take 20 mEq by mouth 2 (two) times daily as needed.      sodium bicarbonate 650 MG tablet Take 1,300 mg by mouth 2 (two) times daily.      tamsulosin (FLOMAX) 0.4 MG CAPS capsule Take 0.4 mg by mouth at bedtime.      torsemide (DEMADEX) 20 MG tablet Take 40 mg by mouth in the morning and at bedtime.      No facility-administered medications prior to visit.    Allergies  Allergen Reactions   Ativan [Lorazepam] Other (See Comments)    Hallucinations, combative   Penicillin G Other (See Comments)    Has patient had a PCN reaction causing immediate rash, facial/tongue/throat swelling, SOB or lightheadedness with hypotension: Yes Has patient had a PCN reaction causing severe rash involving mucus membranes or skin necrosis: No Has patient had a PCN reaction that required hospitalization No Has patient had a PCN reaction occurring within the last 10 years: No If all of the above answers are "NO", then may proceed with Cephalosporin use.    Amlodipine Swelling   Lisinopril Other (See Comments)   Spironolactone Other (See Comments)    Family History  Problem Relation Age of Onset   CAD Mother    Cancer Mother    Heart attack Father     Social History   Tobacco Use   Smoking status: Former Smoker    Types: Cigarettes, Cigars   Smokeless tobacco: Current User    Types: Nurse, children's Use: Never used  Substance Use Topics   Alcohol use: No    Alcohol/week: 0.0 standard drinks   Drug use: No    ROS: As per history of present illness, otherwise negative  Wt 131 lb (59.4 kg)    BMI 19.92 kg/m  Constitutional: Well-developed and well-nourished. No distress. HEENT: Normocephalic and atraumatic. Oropharynx is clear and moist. Conjunctivae are normal.  No scleral icterus. Neck: Neck supple. Trachea midline. Cardiovascular: Normal rate, regular rhythm and intact distal pulses. No M/R/G Pulmonary/chest:  Effort normal and breath sounds normal. No wheezing, rales or rhonchi. Abdominal: Soft, nontender, nondistended. Bowel sounds active throughout. There are no masses palpable. No hepatosplenomegaly. Extremities: no clubbing, cyanosis, or edema Neurological: Alert and oriented to person place and time. Skin: Skin is warm and dry.  Psychiatric: Normal mood and affect. Behavior is normal.  RELEVANT LABS AND IMAGING: CBC    Component Value Date/Time   WBC 4.8 03/25/2020 0114   RBC 3.20 (L) 03/25/2020 0114   HGB 10.0 (L) 03/25/2020 0114   HGB 15.1 03/05/2012 1142   HCT 29.1 (L) 03/25/2020 0114   HCT 43.9 03/05/2012 1142  PLT 142 (L) 03/25/2020 0114   PLT 211 03/05/2012 1142   MCV 90.9 03/25/2020 0114   MCV 84 03/05/2012 1142   MCH 31.3 03/25/2020 0114   MCHC 34.4 03/25/2020 0114   RDW 15.1 03/25/2020 0114   RDW 15.4 (H) 03/05/2012 1142   LYMPHSABS 1.2 03/25/2020 0114   LYMPHSABS 0.9 (L) 03/05/2012 1142   MONOABS 0.4 03/25/2020 0114   MONOABS 0.6 03/05/2012 1142   EOSABS 0.1 03/25/2020 0114   EOSABS 0.1 03/05/2012 1142   BASOSABS 0.0 03/25/2020 0114   BASOSABS 0.0 03/05/2012 1142    CMP     Component Value Date/Time   NA 139 03/25/2020 0114   NA 142 03/07/2012 0520   K 2.9 (L) 03/25/2020 0114   K 3.4 (L) 03/07/2012 1551   CL 106 03/25/2020 0114   CL 105 03/07/2012 0520   CO2 25 03/25/2020 0114   CO2 27 03/07/2012 0520   GLUCOSE 110 (H) 03/25/2020 0114   GLUCOSE 128 (H) 03/07/2012 0520   BUN 33 (H) 03/25/2020 0114   BUN 14 03/07/2012 0520   CREATININE 2.58 (H) 03/25/2020 0114   CREATININE 1.81 (H) 03/07/2012 0520   CALCIUM 8.1 (L) 03/25/2020 0114   CALCIUM 8.2 (L) 03/07/2012 0520   PROT 5.3 (L) 03/25/2020 0114   PROT 7.0 03/05/2012 1142   ALBUMIN 2.7 (L) 03/25/2020 0114   ALBUMIN 3.5 03/05/2012 1142   AST 21 03/25/2020 0114   AST 33 03/05/2012 1142   ALT 18 03/25/2020 0114   ALT 30 03/05/2012 1142   ALKPHOS 52 03/25/2020 0114   ALKPHOS 75 03/05/2012 1142    BILITOT 0.6 03/25/2020 0114   BILITOT 1.0 03/05/2012 1142   GFRNONAA 21 (L) 03/25/2020 0114   GFRNONAA 34 (L) 03/07/2012 0520   GFRAA 24 (L) 03/25/2020 0114   GFRAA 39 (L) 03/07/2012 0520   CT ABDOMEN AND PELVIS WITHOUT CONTRAST   TECHNIQUE: Multidetector CT imaging of the abdomen and pelvis was performed following the standard protocol without IV contrast.   COMPARISON:  03/08/2018   FINDINGS: Lower chest: Sizable hiatal hernia. Chronic trace pleural fluid at the left base. Coronary calcification.   Hepatobiliary: No focal liver abnormality.No evidence of biliary obstruction or stone.   Pancreas: Generalized atrophy.   Spleen: Unremarkable.   Adrenals/Urinary Tract: Negative adrenals. Chronic left hydroureteronephrosis due to distal ureteral calculus with severe left renal atrophy. The left kidney is likely nonfunctional. 15 mm left interpolar calculus. No right hydronephrosis. Thick walled bladder with subtle cellule/diverticulum. Mild perivesicular fat haziness.   Stomach/Bowel:  No obstruction. Left colonic diverticulosis.   Vascular/Lymphatic: No acute vascular abnormality. No mass or adenopathy. Haziness of fat in the sigmoid mesentery without mass or adenopathy-consistent with chronic mesenteric panniculitis.   Reproductive:No acute finding   Other: No ascites or pneumoperitoneum.   Musculoskeletal: No acute abnormalities. Osteopenia and spinal degeneration   IMPRESSION: 1. Possible cystitis. 2. Finding suggesting chronic bladder outlet obstruction. There is only mild bladder distention currently. 3. 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 4. Large hiatal hernia.     Electronically Signed   By: Monte Fantasia M.D.   On: 03/31/2019 06:24  MRI ABDOMEN WITHOUT CONTRAST  (INCLUDING MRCP)   TECHNIQUE: Multiplanar multisequence MR imaging of the abdomen was performed. Heavily T2-weighted images of the biliary and  pancreatic ducts were obtained, and three-dimensional MRCP images were rendered by post processing.   COMPARISON:  CT March 31, 2019   FINDINGS: Lower chest: Small bilateral pleural effusions.  Large hiatal hernia with at least half the stomach within LEFT hemithorax posterior the heart.   Hepatobiliary: No intrahepatic biliary duct dilatation. The common hepatic duct is normal caliber at 7 mm. The common bile duct upper limits normal caliber. In the distal common bile duct there is a potential filling defect (image 5/14 MRCP sequence; and image 20/3 of T2 weighted series). This is inconclusive and there is significant motion artifact.   No gallstones within the gallbladder which weighs against choledocholithiasis. Gallbladder is not distended. No pericholecystic fluid.   Pancreas: No pancreatic inflammation identified. No duct dilatation. 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 One cystic lesion in tail the pancreas measures 7 mm.   Spleen: Several small lesions in the spleen likely represent benign cysts.   Adrenals/urinary tract: Adrenal glands normal. Chronic hydronephrosis of the LEFT kidney with severe cortical atrophy.   Stomach/Bowel: Large hiatal hernia. No abnormality of the limited view small-bowel and colon.   Vascular/Lymphatic: Abdominal aortic normal caliber. No retroperitoneal periportal lymphadenopathy.   Musculoskeletal: No aggressive osseous lesion   IMPRESSION: 1. Potential distal common bile duct stone (choledocholithiasis). Significant motion degradation. Equivocal exam. 2. Minimal intra or extrahepatic biliary duct dilatation. 3. No evidence of acute cholecystitis. No evidence of cholelithiasis. 4. Large hiatal hernia. Chronic LEFT hydronephrosis and cortical atrophy. 5. 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 Lesions of this size do not require follow-up in this age group. This recommendation follows ACR  consensus guidelines: Management of Incidental Pancreatic Cysts: A White Paper of the ACR Incidental Findings Committee. [place] 5427;06:237-628.     Electronically Signed   By: Suzy Bouchard M.D.   On: 04/03/2019 22:06     Ova and Parasite -- neg Sept 2021   ASSESSMENT/PLAN: 86 year old male with a past medical history of hiatal hernia with esophageal stricture, gallstones and prior choledocholithiasis status post ERCP with stone extraction in September 2020, history of prostate cancer, chronic kidney disease, CHF, chronic anemia who is seen to evaluate diarrhea.  1. Diarrhea --persistent diarrhea over a near 19-month.  Now resolved for over 6 weeks.  Given resolution we will not plan further evaluation at this time.  However should this recur he can call for follow-up and further evaluation  2.  GERD/hiatal hernia/esophageal stricture --esophageal dilation during ERCP to 15 mm with balloon in September 2020.  No current alarm symptoms or recurrent dysphagia complaint. --Continue pantoprazole 40 mg daily  3.  Gallstones with history of CBD stones --gallbladder was not removed due to concern with cardiac history after ERCP with stone extraction in September 2020.  We discussed that bile duct obstruction could occur again but is somewhat less likely after sphincterotomy.  Should the symptoms recur he should let us know immediately  Follow-up as needed     BT:DVVOHYW, Nelda Bucks, [place],  [place] [address]

## 2020-07-05 ENCOUNTER — Encounter: Payer: Self-pay | Admitting: Family

## 2020-07-05 ENCOUNTER — Telehealth (INDEPENDENT_AMBULATORY_CARE_PROVIDER_SITE_OTHER): Payer: Medicare Other | Admitting: Family

## 2020-07-05 ENCOUNTER — Telehealth: Payer: Self-pay

## 2020-07-05 DIAGNOSIS — J302 Other seasonal allergic rhinitis: Secondary | ICD-10-CM

## 2020-07-05 MED ORDER — LORATADINE 10 MG PO TABS: 10.0000 mg | ORAL_TABLET | Freq: Every day | ORAL | 11 refills | Status: DC

## 2020-07-05 NOTE — Progress Notes (Signed)
This service is provided via telemedicine  No vital signs collected/recorded due to the encounter was a telemedicine visit.   Location of patient (ex: home, work): Home.  Patient consents to a telephone visit:  Yes.  Location of the provider (ex: office, home):  [facility].  Name of any referring provider:  Shanette Tamargo, Nelda Bucks, NP   Names of all persons participating in the telemedicine service and their role in the encounter:  Patient, [name] Daughter in [place], [place], [place], Marlowe Sax, NP.    Time spent on call: 8 minutes spent on the phone with Medical Assistant.   Provider: Marlowe Sax FNP-C  Haru Shaff, Nelda Bucks, NP  Patient Care Team: Kadee Philyaw, Nelda Bucks, NP as PCP - General (Family Medicine) Dionisio David, MD as Consulting Physician (Cardiology) Lavonia Dana, MD as Consulting Physician (Nephrology) Noreene Filbert, MD as Referring Physician (Radiation Oncology)  Extended Emergency Contact Information Primary Emergency Contact: Lurie,Ruth Address: [address] [place] [address] [place] Phone: [phone] Mobile Phone: [phone] Relation: Spouse Secondary Emergency Contact: [name] Address: [address]          [place], [place] [address] [place] Phone: [phone] Relation: Relative  Code Status:  Full Code  Goals of care: Advanced Directive information Advanced Directives 07/05/2020  Does Patient Have a Medical Advance Directive? No  Type of Advance Directive -  Does patient want to make changes to medical advance directive? -  Copy of [place] in Chart? -  Would patient like information on creating a medical advance directive? No - Patient declined     Chief Complaint  Patient presents with  . Acute Visit    Cough, and Sore Throat.    HPI:  Pt is a 86 y.o. male seen today for an acute visit for evaluation of cough and scratchy throat for few  days.connected on the video call but kept freezing and could not hear provider.Care giver stated their Wifi had poor reception due to there location.visit changed to telephone visit which was much better.He feeling well not running any fever or having any chills but has had runny nose with watery eyes and just feels tired.Appetite is good. Usually has a cough but has worsen with the nasal drainage.Nose feels congested. Temperature today 98.5  Has not received COVID-19 vaccine.Has had no contact with any person with COVID-19 infection.   He denies any wheezing or shortness of breath.    Past Medical History:  Diagnosis Date  . Anemia   . Cardiac disease   . CHF (congestive heart failure) ([place])   . Choledocholithiasis   . Chronic kidney disease (CKD), stage IV (severe) (HCC)    left  . Elevated LFTs   . Esophageal stenosis   . Hiatal hernia 04/03/2019   large  . History of blindness   . History of diarrhea   . History of gallstones   . Hypercholesteremia   . Hypertension   . Hypothyroidism   . Malnutrition ([place])   . Osteoporosis   . Prostate cancer ([place])   . Sepsis ([place])   . Shingles   . Shingles   . Skin cancer    right cheek   Past Surgical History:  Procedure Laterality Date  . CARDIAC CATHETERIZATION  04/13/2014  . CT PERC CHOLECYSTOSTOMY  07/20/2016   Placed at [facility] Interventional Radiology  . ENDOSCOPIC RETROGRADE CHOLANGIOPANCREATOGRAPHY (  ERCP) WITH PROPOFOL N/A 04/06/2019   Procedure: ENDOSCOPIC RETROGRADE CHOLANGIOPANCREATOGRAPHY (ERCP) WITH PROPOFOL;  Surgeon: Lucilla Lame, MD;  Location: [facility] ENDOSCOPY;  Service: Endoscopy;  Laterality: N/A;    Allergies  Allergen Reactions  . Ativan [Lorazepam] Other (See Comments)    Hallucinations, combative  . Penicillin G Other (See Comments)    Has patient had a PCN reaction causing immediate rash, facial/tongue/throat swelling, SOB or lightheadedness with hypotension: Yes Has patient had a PCN reaction causing severe rash  involving mucus membranes or skin necrosis: No Has patient had a PCN reaction that required hospitalization No Has patient had a PCN reaction occurring within the last 10 years: No If all of the above answers are "NO", then may proceed with Cephalosporin use.   . Amlodipine Swelling  . Lisinopril Other (See Comments)  . Spironolactone Other (See Comments)    Outpatient Encounter Medications as of 07/05/2020  Medication Sig  . aspirin EC 81 MG tablet Take 81 mg by mouth at bedtime.  Marland Kitchen atorvastatin (LIPITOR) 40 MG tablet Take 40 mg by mouth at bedtime.   . carbamide peroxide (DEBROX) 6.5 % OTIC solution 5 drops 2 (two) times daily as needed.  . Cholecalciferol (VITAMIN D3) 5000 units TABS Take 5,000 Units by mouth daily.  Marland Kitchen doxazosin (CARDURA) 4 MG tablet Take 4 mg by mouth at bedtime.   . ferrous sulfate 325 (65 FE) MG tablet Take 325 mg by mouth 2 (two) times daily.   . isosorbide mononitrate (IMDUR) 30 MG 24 hr tablet Take 1 tablet (30 mg total) by mouth daily.  Marland Kitchen levothyroxine (SYNTHROID, LEVOTHROID) 88 MCG tablet Take 88 mcg by mouth daily.  Marland Kitchen loperamide (IMODIUM) 2 MG capsule Take 2 mg by mouth as needed for diarrhea or loose stools.  . metoprolol succinate (TOPROL-XL) 50 MG 24 hr tablet Take 50 mg by mouth daily. Take with or immediately following a meal.  . Omega-3 Fatty Acids (FISH OIL) 1200 MG CAPS Take 1,200 mg by mouth 2 (two) times daily.  . pantoprazole (PROTONIX) 40 MG tablet Take 40 mg by mouth daily.  . potassium chloride SA (K-DUR,KLOR-CON) 20 MEQ tablet Take 20 mEq by mouth 2 (two) times daily as needed.   . sodium bicarbonate 650 MG tablet Take 1,300 mg by mouth 2 (two) times daily.   . tamsulosin (FLOMAX) 0.4 MG CAPS capsule Take 0.4 mg by mouth at bedtime.   . torsemide (DEMADEX) 20 MG tablet Take 40 mg by mouth in the morning and at bedtime.   . [DISCONTINUED] carbamide peroxide (DEBROX) 6.5 % OTIC solution Place 5 drops into the left ear 2 (two) times daily. (Patient  taking differently: Place 5 drops into the left ear 2 (two) times daily as needed.)   No facility-administered encounter medications on file as of 07/05/2020.    Review of Systems  Constitutional: Negative for appetite change, chills, fatigue and fever.  HENT: Positive for congestion, rhinorrhea and sore throat. Negative for ear discharge, ear pain, postnasal drip, sinus pressure, sinus pain and sneezing.   Eyes: Negative for pain, discharge and itching.       Watery eyes   Respiratory: Positive for cough. Negative for chest tightness, shortness of breath and wheezing.   Cardiovascular: Negative for chest pain, palpitations and leg swelling.  Gastrointestinal: Negative for abdominal distention, abdominal pain, constipation, diarrhea, nausea and vomiting.  Skin: Negative for color change, pallor and rash.  Neurological: Negative for speech difficulty, weakness, light-headedness and headaches.  Chronic dizziness  Psychiatric/Behavioral: Negative for agitation, behavioral problems, confusion and sleep disturbance.    There is no immunization history for the selected administration types on file for this patient. Pertinent  Health Maintenance Due  Topic Date Due  . PNA vac Low Risk Adult (1 of 2 - PCV13) Never done  . INFLUENZA VACCINE  Never done   Fall Risk  07/05/2020 04/08/2020 11/20/2017 11/21/2016 05/23/2016  Falls in the past year? 0 0 No No No  Number falls in past yr: 0 0 - - -  Injury with Fall? 0 0 - - -   Functional Status Survey:    There were no vitals filed for this visit. There is no height or weight on file to calculate BMI. Physical Exam Unable to complete on Telephone visit.   Labs reviewed: Recent Labs    03/24/20 1235 03/25/20 0114  NA 137 139  K 2.7* 2.9*  CL 102 106  CO2 25 25  GLUCOSE 143* 110*  BUN 34* 33*  CREATININE 2.72* 2.58*  CALCIUM 8.5* 8.1*  MG  --  1.9   Recent Labs    03/24/20 1235 03/25/20 0114  AST 23 21  ALT 20 18  ALKPHOS  50 52  BILITOT 0.8 0.6  PROT 5.5* 5.3*  ALBUMIN 2.7* 2.7*   Recent Labs    03/24/20 1235 03/25/20 0114  WBC 3.2* 4.8  NEUTROABS 2.2 3.0  HGB 10.4* 10.0*  HCT 32.2* 29.1*  MCV 96.7 90.9  PLT 149* 142*   Lab Results  Component Value Date   TSH 0.300 (L) 03/08/2018   Lab Results  Component Value Date   HGBA1C 5.4 06/25/2016   Lab Results  Component Value Date   CHOL 122 12/02/2010   HDL 42 12/02/2010   LDLCALC  12/02/2010    51        Total Cholesterol/HDL:CHD Risk Coronary Heart Disease Risk Table                     Men   Women  1/2 Average Risk   3.4   3.3  Average Risk       5.0   4.4  2 X Average Risk   9.6   7.1  3 X Average Risk  23.4   11.0        Use the calculated Patient Ratio above and the CHD Risk Table to determine the patient's CHD Risk.        ATP III CLASSIFICATION (LDL):  <100     mg/dL   Optimal  100-129  mg/dL   Near or Above                    Optimal  130-159  mg/dL   Borderline  160-189  mg/dL   High  >190     mg/dL   Very High   TRIG 146 12/02/2010   CHOLHDL 2.9 12/02/2010    Significant Diagnostic Results in last 30 days:  No results found.  Assessment/Plan  Seasonal allergic rhinitis, unspecified trigger Reports clear nasal drainage,congestion,watery eyes and cough.suspect drain could be causing him to cough.overall states not feeling bad. - Encouraged to increase fluid intake. - take loratadine as below - loratadine (CLARITIN) 10 MG tablet; Take 1 tablet (10 mg total) by mouth daily.  Dispense: 30 tablet; Refill: 11 - Notify provider if symptoms worsen or develops any shortness of breath or wheezing    Family/ staff Communication: Reviewed plan of  care with patient  Labs/tests ordered: None   Next Appointment: As needed if symptoms worsen or fail to improve.   I connected with  [name] on 07/05/20 by a telephone enabled telemedicine application and verified that I am speaking with the correct person using two  identifiers.   I discussed the limitations of evaluation and management by telemedicine. The patient expressed understanding and agreed to proceed.  Spent 15 minutes of non-face to face on Telephone with patient      Sandrea Hughs, NP

## 2020-07-05 NOTE — Patient Instructions (Signed)
-   Notify provider if symptoms worsen or develops any shortness of breath or wheezing

## 2020-07-05 NOTE — Telephone Encounter (Signed)
[name], [name] are scheduled for a virtual visit with your provider today.    Just as we do with appointments in the office, we must obtain your consent to participate.  Your consent will be active for this visit and any virtual visit you may have with one of our providers in the next 365 days.    If you have a MyChart account, I can also send a copy of this consent to you electronically.  All virtual visits are billed to your insurance company just like a traditional visit in the office.  As this is a virtual visit, video technology does not allow for your provider to perform a traditional examination.  This may limit your provider's ability to fully assess your condition.  If your provider identifies any concerns that need to be evaluated in person or the need to arrange testing such as labs, EKG, etc, we will make arrangements to do so.    Although advances in technology are sophisticated, we cannot ensure that it will always work on either your end or our end.  If the connection with a video visit is poor, we may have to switch to a telephone visit.  With either a video or telephone visit, we are not always able to ensure that we have a secure connection.   I need to obtain your verbal consent now.   Are you willing to proceed with your visit today?   [name] has provided verbal consent on 07/05/2020 for a virtual visit (video or telephone).   [place], [place] 07/05/2020  1:30 PM

## 2020-07-08 ENCOUNTER — Ambulatory Visit (HOSPITAL_COMMUNITY)
Admission: RE | Admit: 2020-07-08 | Discharge: 2020-07-08 | Disposition: A | Discharge status: Home / Self Care | Payer: Medicare Other | Source: Ambulatory Visit | Attending: Pulmonary Disease | Admitting: Pulmonary Disease

## 2020-07-08 ENCOUNTER — Other Ambulatory Visit: Payer: Self-pay | Admitting: Nurse Practitioner

## 2020-07-08 DIAGNOSIS — Z23 Encounter for immunization: Secondary | ICD-10-CM | POA: Diagnosis not present

## 2020-07-08 DIAGNOSIS — U071 COVID-19: Secondary | ICD-10-CM | POA: Insufficient documentation

## 2020-07-08 MED ORDER — DIPHENHYDRAMINE HCL 50 MG/ML IJ SOLN: 50.0000 mg | Freq: Once | INTRAMUSCULAR | Status: DC | PRN

## 2020-07-08 MED ORDER — FAMOTIDINE IN NACL 20-0.9 MG/50ML-% IV SOLN: 20.0000 mg | Freq: Once | INTRAVENOUS | Status: DC | PRN

## 2020-07-08 MED ORDER — SODIUM CHLORIDE 0.9 % IV SOLN: Freq: Once | INTRAVENOUS | Status: AC

## 2020-07-08 MED ORDER — SODIUM CHLORIDE 0.9 % IV SOLN: INTRAVENOUS | Status: DC | PRN

## 2020-07-08 MED ORDER — ALBUTEROL SULFATE HFA 108 (90 BASE) MCG/ACT IN AERS: 2.0000 | INHALATION_SPRAY | Freq: Once | RESPIRATORY_TRACT | Status: DC | PRN

## 2020-07-08 MED ORDER — EPINEPHRINE 0.3 MG/0.3ML IJ SOAJ: 0.3000 mg | Freq: Once | INTRAMUSCULAR | Status: DC | PRN

## 2020-07-08 MED ORDER — SOTROVIMAB 500 MG/8ML IV SOLN: 500.0000 mg | Freq: Once | INTRAVENOUS | Status: DC

## 2020-07-08 MED ORDER — METHYLPREDNISOLONE SODIUM SUCC 125 MG IJ SOLR: 125.0000 mg | Freq: Once | INTRAMUSCULAR | Status: DC | PRN

## 2020-07-08 NOTE — Progress Notes (Signed)
  Diagnosis: COVID-19  Physician:Dr Joya Gaskins   Procedure: [facility] Infusion [facility] Med: casirivimab\imdevimab infusion - Provided patient with casirivimab\imdevimab fact sheet for patients, parents and caregivers prior to infusion.  Complications: No immediate complications noted.  Discharge: Discharged home   [name] 07/08/2020

## 2020-07-08 NOTE — Discharge Instructions (Signed)
10 Things You Can Do to Manage Your COVID-19 Symptoms at Home If you have possible or confirmed COVID-19: 1. Stay home from work and school. And stay away from other public places. If you must go out, avoid using any kind of public transportation, ridesharing, or taxis. 2. Monitor your symptoms carefully. If your symptoms get worse, call your healthcare provider immediately. 3. Get rest and stay hydrated. 4. If you have a medical appointment, call the healthcare provider ahead of time and tell them that you have or may have COVID-19. 5. For medical emergencies, call 911 and notify the dispatch personnel that you have or may have COVID-19. 6. Cover your cough and sneezes with a tissue or use the inside of your elbow. 7. Wash your hands often with soap and water for at least 20 seconds or clean your hands with an alcohol-based hand sanitizer that contains at least 60% alcohol. 8. As much as possible, stay in a specific room and away from other people in your home. Also, you should use a separate bathroom, if available. If you need to be around other people in or outside of the home, wear a mask. 9. Avoid sharing personal items with other people in your household, like dishes, towels, and bedding. 10. Clean all surfaces that are touched often, like counters, tabletops, and doorknobs. Use household cleaning sprays or wipes according to the label instructions. cdc.gov/coronavirus 01/07/2019 This information is not intended to replace advice given to you by your health care provider. Make sure you discuss any questions you have with your health care provider. Document Revised: 06/11/2019 Document Reviewed: 06/11/2019 Elsevier Patient Education  2020 Elsevier Inc. What types of side effects do monoclonal antibody drugs cause?  Common side effects  In general, the more common side effects caused by monoclonal antibody drugs include: . Allergic reactions, such as hives or itching . Flu-like signs and  symptoms, including chills, fatigue, fever, and muscle aches and pains . Nausea, vomiting . Diarrhea . Skin rashes . Low blood pressure   The CDC is recommending patients who receive monoclonal antibody treatments wait at least 90 days before being vaccinated.  Currently, there are no data on the safety and efficacy of mRNA COVID-19 vaccines in persons who received monoclonal antibodies or convalescent plasma as part of COVID-19 treatment. Based on the estimated half-life of such therapies as well as evidence suggesting that reinfection is uncommon in the 90 days after initial infection, vaccination should be deferred for at least 90 days, as a precautionary measure until additional information becomes available, to avoid interference of the antibody treatment with vaccine-induced immune responses. If you have any questions or concerns after the infusion please call the Advanced Practice Provider on call at [phone]. This number is ONLY intended for your use regarding questions or concerns about the infusion post-treatment side-effects.  Please do not provide this number to others for use. For return to work notes please contact your primary care provider.   If someone you know is interested in receiving treatment please have them call the COVID hotline at [phone].   

## 2020-07-08 NOTE — Progress Notes (Signed)
Patient reviewed Fact Sheet for Patients, Parents, and Caregivers for Emergency Use Authorization (EUA) of casirivimab/imdevimab for the Treatment of Coronavirus.  Patient also reviewed and is agreeable to the estimated cost of treatment.  Patient is agreeable to proceed.  

## 2020-07-08 NOTE — Progress Notes (Signed)
I connected by phone with [name] on 07/08/2020 at 3:02 PM to discuss the potential use of a treatment for mild to moderate COVID-19 viral infection in non-hospitalized patients.  This patient is a 86 y.o. male that meets the FDA criteria for Emergency Use Authorization of bamlanivimab/etesevimab, casirivimab\imdevimab, or sotrovimab  Has a (+) direct SARS-CoV-2 viral test result  Has mild or moderate COVID-19   Is ? 84 years of age and weighs ? 40 kg  Is NOT hospitalized due to COVID-19  Is NOT requiring oxygen therapy or requiring an increase in baseline oxygen flow rate due to COVID-19  Is within 10 days of symptom onset  Has at least one of the high risk factor(s) for progression to severe COVID-19 and/or hospitalization as defined in EUA.  Specific high risk criteria : Older age (>/= 84 yo), Chronic Kidney Disease (CKD), Immunosuppressive Disease or Treatment and Cardiovascular disease or hypertension   Onset 12/27. Unvaccinated.    I have spoken and communicated the following to the patient or parent/caregiver:  1. FDA has authorized the emergency use of bamlanivimab/etesevimab, casirivimab\imdevimab, or sotrovimab for the treatment of mild to moderate COVID-19 in adults and pediatric patients with positive results of direct SARS-CoV-2 viral testing who are 24 years of age and older weighing at least 40 kg, and who are at high risk for progressing to severe COVID-19 and/or hospitalization.  2. The significant known and potential risks and benefits of bamlanivimab/etesevimab, casirivimab\imdevimab, or sotrovimab, and the extent to which such potential risks and benefits are unknown.  3. Information on available alternative treatments and the risks and benefits of those alternatives, including clinical trials.  4. Patients treated with bamlanivimab/etesevimab, casirivimab\imdevimab, or sotrovimab should continue to self-isolate and use infection control measures (e.g., wear  mask, isolate, social distance, avoid sharing personal items, clean and disinfect "high touch" surfaces, and frequent handwashing) according to CDC guidelines.   5. The patient or parent/caregiver has the option to accept or refuse bamlanivimab/etesevimab, casirivimab\imdevimab, or sotrovimab.  After reviewing this information with the patient, the patient has agreed to receive one of the available covid 19 monoclonal antibodies and will be provided an appropriate fact sheet prior to infusion.Beckey Rutter, [place], AGNP-C [phone] ([place])

## 2020-08-03 ENCOUNTER — Ambulatory Visit: Payer: Medicare Other | Admitting: Podiatry

## 2021-03-28 ENCOUNTER — Encounter: Payer: Self-pay | Admitting: Radiation Oncology

## 2021-03-29 ENCOUNTER — Other Ambulatory Visit: Payer: Self-pay | Admitting: *Deleted

## 2021-05-18 ENCOUNTER — Inpatient Hospital Stay: Payer: Medicare Other | Attending: Radiation Oncology

## 2021-05-18 ENCOUNTER — Other Ambulatory Visit: Payer: Self-pay

## 2021-05-18 DIAGNOSIS — C61 Malignant neoplasm of prostate: Secondary | ICD-10-CM | POA: Insufficient documentation

## 2021-05-18 LAB — PSA: Prostatic Specific Antigen: 8.55 ng/mL — ABNORMAL HIGH (ref 0.00–4.00)

## 2021-05-25 ENCOUNTER — Ambulatory Visit
Admission: RE | Admit: 2021-05-25 | Discharge: 2021-05-25 | Disposition: A | Discharge status: Home / Self Care | Payer: Medicare Other | Source: Ambulatory Visit | Attending: Radiation Oncology | Admitting: Radiation Oncology

## 2021-05-25 ENCOUNTER — Other Ambulatory Visit: Payer: Self-pay

## 2021-05-25 ENCOUNTER — Other Ambulatory Visit: Payer: Self-pay | Admitting: *Deleted

## 2021-05-25 ENCOUNTER — Encounter: Payer: Self-pay | Admitting: Radiation Oncology

## 2021-05-25 VITALS — BP 122/72 | HR 64 | Temp 97.6°F | Wt 144.3 lb

## 2021-05-25 DIAGNOSIS — C61 Malignant neoplasm of prostate: Secondary | ICD-10-CM

## 2021-05-25 DIAGNOSIS — R9721 Rising PSA following treatment for malignant neoplasm of prostate: Secondary | ICD-10-CM | POA: Insufficient documentation

## 2021-05-25 DIAGNOSIS — Z923 Personal history of irradiation: Secondary | ICD-10-CM | POA: Insufficient documentation

## 2021-05-25 NOTE — Progress Notes (Signed)
Radiation Oncology Follow up Note  Name: [name]   Date:   05/25/2021 MRN:  [id number] DOB: 1927/04/26    This 87 y.o. 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  REFERRING PROVIDER: Ngetich, Nelda Bucks, NP  HPI: Patient is a 87 year old male now out 1 year since last seen.  We have been tracking a rising PSA.  Which rose to 4.732 years prior.  He responded well to ADT therapy.  PSA 1 year ago was 0.9 is now crept back up to 8.55.  He continues to be asymptomatic specifically denies bone pain is having no significant lower urinary tract symptoms or diarrhea.  COMPLICATIONS OF TREATMENT: none  FOLLOW UP COMPLIANCE: keeps appointments   PHYSICAL EXAM:  BP 122/72   Pulse 64   Temp 97.6 F (36.4 C) (Tympanic)   Wt 144 lb 4.8 oz (65.5 kg)   BMI 21.94 kg/m  Wheelchair-bound frail male in NAD.  Well-developed well-nourished patient in NAD. HEENT reveals PERLA, EOMI, discs not visualized.  Oral cavity is clear. No oral mucosal lesions are identified. Neck is clear without evidence of cervical or supraclavicular adenopathy. Lungs are clear to A&P. Cardiac examination is essentially unremarkable with regular rate and rhythm without murmur rub or thrill. Abdomen is benign with no organomegaly or masses noted. Motor sensory and DTR levels are equal and symmetric in the upper and lower extremities. Cranial nerves II through XII are grossly intact. Proprioception is intact. No peripheral adenopathy or edema is identified. No motor or sensory levels are noted. Crude visual fields are within normal range.  RADIOLOGY RESULTS: No current films to review  PLAN: This time like the patient to be followed by medical oncology.  Would suggest pulse ADT therapy.  He is with his daughter today who comprehends my recommendations well.  I have asked to see him back in 6 months for follow-up.  Appointment with medical oncology  was arranged.  I would like to take this opportunity to thank you for allowing me to participate in the care of your patient.Noreene Filbert, MD

## 2021-06-07 ENCOUNTER — Inpatient Hospital Stay: Payer: Medicare Other | Admitting: Oncology

## 2021-06-07 ENCOUNTER — Inpatient Hospital Stay: Payer: Medicare Other

## 2021-06-19 ENCOUNTER — Telehealth: Payer: Self-pay | Admitting: *Deleted

## 2021-06-19 ENCOUNTER — Other Ambulatory Visit: Payer: Self-pay | Admitting: *Deleted

## 2021-06-19 NOTE — Telephone Encounter (Signed)
I reached out to the new patient for Dr. Tasia Catchings, who is scheduled tomorrow. Introduced oncology services to family and inquired if they had any questions/concerns prior to the md apt.  Pt is schedule to see dr. Tasia Catchings- ref by Dr. Baruch Gouty for prostate cancer. Daughter in law stated that she was told by radiation that her dad would have the eligard/lupron tommorrow when he sees Dr. Tasia Catchings. she would like everything done in one day. can you confirm if this would be ok. I do see an order in epic. but drug was last given here in 2020.   Daughter in law would like a mychart msg sent to her before the patient's apt to confirm whether or not pt would receive the medication. I told the daughter in law that I would follow-up with the team and get back with her as soon as possible. Per daughter in law, pt's wife is currently in rehab. Family has caregiver burdens and would prefer everything to be done in one visit, even if this means to r/s the apts.

## 2021-06-20 ENCOUNTER — Encounter: Payer: Self-pay | Admitting: Oncology

## 2021-06-20 ENCOUNTER — Other Ambulatory Visit: Payer: Self-pay

## 2021-06-20 ENCOUNTER — Inpatient Hospital Stay: Payer: Medicare Other | Attending: Oncology | Admitting: Oncology

## 2021-06-20 ENCOUNTER — Encounter: Payer: Self-pay | Admitting: Radiation Oncology

## 2021-06-20 ENCOUNTER — Inpatient Hospital Stay: Payer: Medicare Other

## 2021-06-20 VITALS — BP 111/72 | HR 81 | Temp 97.4°F | Resp 18 | Wt 143.4 lb

## 2021-06-20 DIAGNOSIS — Z7189 Other specified counseling: Secondary | ICD-10-CM

## 2021-06-20 DIAGNOSIS — R972 Elevated prostate specific antigen [PSA]: Secondary | ICD-10-CM | POA: Diagnosis not present

## 2021-06-20 DIAGNOSIS — Z87891 Personal history of nicotine dependence: Secondary | ICD-10-CM

## 2021-06-20 DIAGNOSIS — Z5111 Encounter for antineoplastic chemotherapy: Secondary | ICD-10-CM | POA: Insufficient documentation

## 2021-06-20 DIAGNOSIS — C61 Malignant neoplasm of prostate: Secondary | ICD-10-CM | POA: Insufficient documentation

## 2021-06-20 MED ORDER — LEUPROLIDE ACETATE (6 MONTH) 45 MG ~~LOC~~ KIT: 45.0000 mg | PACK | Freq: Once | SUBCUTANEOUS | Status: DC

## 2021-06-20 MED ORDER — LEUPROLIDE ACETATE (6 MONTH) 45 MG ~~LOC~~ KIT
45.0000 mg | PACK | Freq: Once | SUBCUTANEOUS | Status: AC
  Administered 2021-06-20: 45 mg via SUBCUTANEOUS

## 2021-06-20 NOTE — Progress Notes (Signed)
Pt here for follow up. No new concerns voiced.   

## 2021-06-20 NOTE — Progress Notes (Signed)
Hematology/Oncology Consult note [phone] [phone]      Patient Care Team: Ngetich, Nelda Bucks, NP as PCP - General (Family Medicine) Dionisio David, MD as Consulting Physician (Cardiology) Lavonia Dana, MD as Consulting Physician (Nephrology) Noreene Filbert, MD as Referring Physician (Radiation Oncology)  REFERRING PROVIDER: Noreene Filbert, MD  CHIEF COMPLAINTS/REASON FOR VISIT:  Evaluation of prostate cancer  HISTORY OF PRESENTING ILLNESS:   [name] is a  87 y.o.  male with PMH listed below was seen in consultation at the request of  Noreene Filbert, MD  for evaluation of prostate cancer.   Patient was diagnosed with prostate cancer in 2006. No original pathology report is available in current EMR.  He follows up with Dr.Chrystal. patient is a poor historian.  Medical records were reviewed.  Per note, he has history of Gleason 7 adenocarcinoma of prostate, previously on pulsatile leupron therapy.  It was not clear when was his last dose of leupron.  PSA has been followed every 6 -12 months.  05/17/2011 PSA 8.3 11/15/2011 PSA 1 05/22/2012 PSA 2.7 06/01/2013 PSA 3.9 11/20/2013 PSA 5.5 05/12/2014 PSA 0.4 11/10/2014 PSA 3.65 11/24/2015 PSA 5.64 05/23/2016 PSA 7.77 10/29/2016 PSA 0.12 05/16/2017 PSA 3.99 11/13/2017 PSA 0.11 03/08/2018 PSA 2.37 05/21/2018 PSA 3.37 04/03/2019 PSA 2.2 05/18/2019 PSA 4.73 11/16/2019 PSA 0.22 05/24/2020 PSA 0.9 05/18/2021 PSA 8.55  Today patient presents to establish care. Accompanied by her daughter in law. He lives with his grandson.  Daughter in law called earlier to confirm if patient will receive Eligard on the same day of his visit. Family has care burdens and would prefer everything to be done in one visit   Patient reports feeling well. He prefers to get his Eligard injection today. " I want the injection as it has kept me alive".   Review of Systems  Constitutional:  Negative for appetite change, chills,  fatigue, fever and unexpected weight change.  HENT:   Negative for hearing loss and voice change.   Eyes:  Negative for eye problems and icterus.  Respiratory:  Negative for chest tightness, cough and shortness of breath.   Cardiovascular:  Negative for chest pain and leg swelling.  Gastrointestinal:  Negative for abdominal distention and abdominal pain.  Endocrine: Negative for hot flashes.  Genitourinary:  Negative for difficulty urinating, dysuria and frequency.   Musculoskeletal:  Positive for arthralgias and back pain.  Skin:  Negative for itching and rash.  Neurological:  Negative for light-headedness and numbness.  Hematological:  Negative for adenopathy. Does not bruise/bleed easily.  Psychiatric/Behavioral:  Negative for confusion.    MEDICAL HISTORY:  Past Medical History:  Diagnosis Date   Anemia    Cardiac disease    CHF (congestive heart failure) (HCC)    Choledocholithiasis    Chronic kidney disease (CKD), stage IV (severe) (HCC)    left   Elevated LFTs    Esophageal stenosis    Hiatal hernia 04/03/2019   large   History of blindness    History of diarrhea    History of gallstones    Hypercholesteremia    Hypertension    Hypothyroidism    Malnutrition ([place])    Osteoporosis    Prostate cancer ([place])    Sepsis ([place])    Shingles    Shingles    Skin cancer    right cheek    SURGICAL HISTORY: Past Surgical History:  Procedure Laterality Date   CARDIAC CATHETERIZATION  04/13/2014   CT PERC CHOLECYSTOSTOMY  07/20/2016  Placed at [facility] Interventional Radiology   ENDOSCOPIC RETROGRADE CHOLANGIOPANCREATOGRAPHY (ERCP) WITH PROPOFOL N/A 04/06/2019   Procedure: ENDOSCOPIC RETROGRADE CHOLANGIOPANCREATOGRAPHY (ERCP) WITH PROPOFOL;  Surgeon: Lucilla Lame, MD;  [facility] ENDOSCOPY;  Service: Endoscopy;  Laterality: N/A;    SOCIAL HISTORY: Social History   Socioeconomic History   Marital status: Married    Spouse name: Not on file   Number of children: Not on  file   Years of education: Not on file   Highest education level: Not on file  Occupational History   Not on file  Tobacco Use   Smoking status: Former    Types: Cigarettes, Cigars    Quit date: 66    Years since quitting: 60.9   Smokeless tobacco: Current    Types: Chew  Vaping Use   Vaping Use: Never used  Substance and Sexual Activity   Alcohol use: Not Currently   Drug use: No   Sexual activity: Not Currently  Other Topics Concern   Not on file  Social History Narrative   Tobacco use, amount per day now: Yes chews tobacco several times.   Past tobacco use, amount per day:   How many years did you use tobacco: 80+ years.   Alcohol use (drinks per week): Past beer drinker.   Diet:   Do you drink/eat things with caffeine: No   Marital status:  Married.                                What year were you married?   Do you live in a house, apartment, assisted living, condo, trailer, etc.? House.   Is it one or more stories? 1   How many persons live in your home? 3   Do you have pets in your home?( please list) No.   Highest Level of education completed? High School   Current or past profession: Teacher, adult education.   Do you exercise?  No.                                Type and how often?   Do you have a living will? No   Do you have a DNR form?    No                               If not, do you want to discuss one?   Do you have signed POA/HPOA forms?  No                   If so, please bring to you appointment      Do you have any difficulty bathing or dressing yourself? Yes   Do you have any difficulty preparing food or eating? Yes   Do you have any difficulty managing your medications? Yes   Do you have any difficulty managing your finances? Yes   Do you have any difficulty affording your medications? No   Social Determinants of Radio broadcast assistant Strain: Not on file  Food Insecurity: Not on file  Transportation Needs: Not on file  Physical  Activity: Not on file  Stress: Not on file  Social Connections: Not on file  Intimate Partner Violence: Not on file    FAMILY HISTORY: Family History  Problem Relation Age of Onset  CAD Mother    Cancer Mother    Heart attack Father     ALLERGIES:  is allergic to ativan [lorazepam], penicillin g, amlodipine, lisinopril, and spironolactone.  MEDICATIONS:  Current Outpatient Medications  Medication Sig Dispense Refill   aspirin EC 81 MG tablet Take 81 mg by mouth at bedtime.     atorvastatin (LIPITOR) 40 MG tablet Take 40 mg by mouth at bedtime.      Cholecalciferol (VITAMIN D3) 5000 units TABS Take 5,000 Units by mouth daily.     doxazosin (CARDURA) 4 MG tablet Take 4 mg by mouth at bedtime.      ferrous sulfate 325 (65 FE) MG tablet Take 325 mg by mouth 2 (two) times daily.      isosorbide mononitrate (IMDUR) 30 MG 24 hr tablet Take 1 tablet (30 mg total) by mouth daily. 30 tablet 1   levothyroxine (SYNTHROID, LEVOTHROID) 88 MCG tablet Take 88 mcg by mouth daily.  1   loperamide (IMODIUM) 2 MG capsule Take 2 mg by mouth as needed for diarrhea or loose stools.     metoprolol succinate (TOPROL-XL) 50 MG 24 hr tablet Take 50 mg by mouth daily. Take with or immediately following a meal.     Omega-3 Fatty Acids (FISH OIL) 1200 MG CAPS Take 1,200 mg by mouth 2 (two) times daily.     pantoprazole (PROTONIX) 40 MG tablet Take 40 mg by mouth daily.     potassium chloride SA (K-DUR,KLOR-CON) 20 MEQ tablet Take 20 mEq by mouth 2 (two) times daily as needed.      sodium bicarbonate 650 MG tablet Take 1,300 mg by mouth 2 (two) times daily.      tamsulosin (FLOMAX) 0.4 MG CAPS capsule Take 0.4 mg by mouth at bedtime.      torsemide (DEMADEX) 20 MG tablet Take 40 mg by mouth in the morning and at bedtime.      No current facility-administered medications for this visit.     PHYSICAL EXAMINATION: ECOG PERFORMANCE STATUS: 2 - Symptomatic, <50% confined to bed Vitals:   06/20/21 1526  BP:  111/72  Pulse: 81  Resp: 18  Temp: (!) 97.4 F (36.3 C)   Filed Weights   06/20/21 1526  Weight: 143 lb 6.4 oz (65 kg)    Physical Exam Constitutional:      General: He is not in acute distress.    Comments: Frail elderly male patient, he sits in a wheelchair.   HENT:     Head: Normocephalic and atraumatic.  Eyes:     General: No scleral icterus. Cardiovascular:     Rate and Rhythm: Normal rate and regular rhythm.     Heart sounds: Normal heart sounds.  Pulmonary:     Effort: Pulmonary effort is normal. No respiratory distress.     Breath sounds: No wheezing.  Abdominal:     General: Bowel sounds are normal. There is no distension.     Palpations: Abdomen is soft.  Musculoskeletal:        General: No deformity. Normal range of motion.     Cervical back: Normal range of motion and neck supple.  Skin:    General: Skin is warm and dry.     Findings: No erythema or rash.  Neurological:     Mental Status: He is alert. Mental status is at baseline.     Cranial Nerves: No cranial nerve deficit.     Coordination: Coordination normal.  Psychiatric:  Mood and Affect: Mood normal.    LABORATORY DATA:  I have reviewed the data as listed Lab Results  Component Value Date   WBC 4.8 03/25/2020   HGB 10.0 (L) 03/25/2020   HCT 29.1 (L) 03/25/2020   MCV 90.9 03/25/2020   PLT 142 (L) 03/25/2020   No results for input(s): NA, K, CL, CO2, GLUCOSE, BUN, CREATININE, CALCIUM, GFRNONAA, GFRAA, PROT, ALBUMIN, AST, ALT, ALKPHOS, BILITOT, BILIDIR, IBILI in the last 8760 hours. Iron/TIBC/Ferritin/ %Sat    Component Value Date/Time   IRON 32 (L) 03/09/2018 1450   TIBC 168 (L) 03/09/2018 1450   FERRITIN 950 (H) 04/03/2019 1552   IRONPCTSAT 19 03/09/2018 1450      RADIOGRAPHIC STUDIES: I have personally reviewed the radiological images as listed and agreed with the findings in the report. No results found.    ASSESSMENT & PLAN:  1. Malignant neoplasm of prostate (HCC)    2. Elevated PSA   3. Goals of care, counseling/discussion    # History of prostate cancer, now with increased of PSA.  I had a lengthy discussion with patient and her daughter in law.  I recommend imaging work up CT or PSMA PET for evaluation of his disease extent.  PSA elevation is non specific, can be from inflammation, biochemical recurrence or metastatic disease.  Patient is not interested in additional work up due to his age and other medical problems. He prefers to be treated with Eligard injection for presumed prostate cancer recurrence. Proceed with Eligard 45mg  injection today.  Recommend him to establish care with palliative care service for goals of care discussion.   Follow up plan to be determined, based on his goals of care.    All questions were answered. The patient knows to call the clinic with any problems questions or concerns.  cc Noreene Filbert, MD    Return of visit: TBD Thank you for this kind referral and the opportunity to participate in the care of this patient. A copy of today's note is routed to referring provider   Earlie Server, MD, PhD  06/20/2021

## 2021-06-22 ENCOUNTER — Encounter: Payer: Self-pay | Admitting: Radiation Oncology

## 2021-06-22 NOTE — Progress Notes (Signed)
error 

## 2021-06-28 ENCOUNTER — Inpatient Hospital Stay: Payer: Medicare Other | Admitting: Hospice and Palliative Medicine

## 2021-06-28 DIAGNOSIS — C61 Malignant neoplasm of prostate: Secondary | ICD-10-CM

## 2021-06-28 NOTE — Progress Notes (Signed)
Multidisciplinary Oncology Council Documentation  [name] was presented by our [facility] on 06/28/2021, which included representatives from:  Palliative Care Dietitian  Physical/Occupational Therapist Nurse Navigator Genetics Speech Therapist Social work Survivorship RN Financial Navigator Research RN   [name] currently presents with history of prostate cancer.  We reviewed previous medical and familial history, history of present illness, and recent lab results along with all available histopathologic and imaging studies. The [place] considered available treatment options and made the following recommendations/referrals:  - May refer to social work if pt needs assistance with family care burdens. - Keep appt as scheduled with Josh on 1/18 to establish care with palliative care services.   The MOC is a meeting of clinicians from various specialty areas who evaluate and discuss patients for whom a multidisciplinary approach is being considered. Final determinations in the plan of care are those of the provider(s).   Today's extended care, comprehensive team conference, [name] was not present for the discussion and was not examined.

## 2021-07-26 ENCOUNTER — Inpatient Hospital Stay: Payer: Medicare Other | Attending: Hospice and Palliative Medicine | Admitting: Hospice and Palliative Medicine

## 2021-10-12 ENCOUNTER — Ambulatory Visit
Admission: RE | Admit: 2021-10-12 | Discharge: 2021-10-12 | Disposition: A | Discharge status: Home / Self Care | Payer: Medicare Other | Source: Ambulatory Visit | Attending: Internal Medicine | Admitting: Internal Medicine

## 2021-10-12 VITALS — BP 102/61 | HR 60 | Temp 98.2°F | Resp 16 | Wt 141.2 lb

## 2021-10-12 DIAGNOSIS — N3000 Acute cystitis without hematuria: Secondary | ICD-10-CM | POA: Diagnosis present

## 2021-10-12 LAB — POCT URINALYSIS DIP (MANUAL ENTRY)
Bilirubin, UA: NEGATIVE
Glucose, UA: NEGATIVE mg/dL
Ketones, POC UA: NEGATIVE mg/dL
Nitrite, UA: NEGATIVE
Protein Ur, POC: NEGATIVE mg/dL
Spec Grav, UA: 1.01 (ref 1.010–1.025)
Urobilinogen, UA: 0.2 E.U./dL
pH, UA: 7 (ref 5.0–8.0)

## 2021-10-12 MED ORDER — CIPROFLOXACIN HCL 250 MG PO TABS: 250.0000 mg | ORAL_TABLET | Freq: Two times a day (BID) | ORAL | 0 refills | Status: DC

## 2021-10-12 NOTE — Discharge Instructions (Signed)
We are sending the urine for a culture and we will notify you if we need to change the medication.  ?

## 2021-10-12 NOTE — ED Provider Notes (Signed)
?[facility] ? ? ? ?CSN: [id number] ?Arrival date & time: 10/12/21  1528 ? ? ?  ? ?History   ?Chief Complaint ?Chief Complaint  ?Patient presents with  ? Fatigue  ?  Need him checked for UTI.  Caregivers noticed sleep pattern change awake all night/sleeping all day.  He's also been grumpy & mean that's out of character for him.  Yolanda Bonine says his urine smells bad. - Entered by patient  ? ? ?HPI ?[name] is a 88 y.o. male who presents with his daughter-in-law due to his care giver and grandson, seeing change in his sleep paterna and mental status this week. His grandson has noticed a foul urine odor. Pt has hx of frequency but has not been any worse. Has not had a fever or back pain.  ?Has been staying up at night watching TV which is new to him in the past week. Pt denies dysuria, blood or cloudy urine. He is not prone to getting bladder infections.  ? ? ? ?Past Medical History:  ?Diagnosis Date  ? Anemia   ? Cardiac disease   ? CHF (congestive heart failure) ([place])   ? Choledocholithiasis   ? Chronic kidney disease (CKD), stage IV (severe) (HCC)   ? left  ? Elevated LFTs   ? Esophageal stenosis   ? Hiatal hernia 04/03/2019  ? large  ? History of blindness   ? History of diarrhea   ? History of gallstones   ? Hypercholesteremia   ? Hypertension   ? Hypothyroidism   ? Malnutrition ([place])   ? Osteoporosis   ? Prostate cancer ([place])   ? Sepsis ([place])   ? Shingles   ? Shingles   ? Skin cancer   ? right cheek  ? ? ?Patient Active Problem List  ? Diagnosis Date Noted  ? Elevated PSA 06/20/2021  ? Low BP 04/08/2020  ? Kidney failure 04/08/2020  ? Congestive heart failure ([place]) 04/08/2020  ? Acidosis 05/27/2019  ? Anemia in chronic kidney disease 05/27/2019  ? Benign hypertensive kidney disease with chronic kidney disease 05/27/2019  ? Proteinuria 05/27/2019  ? Secondary hyperparathyroidism of renal origin ([place]) 05/27/2019  ? Blind 04/08/2019  ? CAD (coronary artery disease) 04/08/2019  ? Thrombocytopathia ([place])  04/08/2019  ? Choledocholithiasis   ? Elevated liver enzymes   ? Stricture and stenosis of esophagus   ? Malnutrition of moderate degree 04/03/2019  ? Chronic diastolic (congestive) heart failure ([place]) 03/31/2019  ? Malignant neoplasm of prostate ([place]) 03/31/2019  ? Weakness   ? Elevated LFTs   ? Chronic kidney disease, stage 4 (severe) (HCC)   ? Aortic atherosclerosis ([place])   ? Bilateral nephrolithiasis   ? Hydronephrosis concurrent with and due to calculi of kidney and ureter   ? Acute encephalopathy 03/08/2018  ? Gallbladder abscess 07/31/2016  ? Cholecystitis 07/20/2016  ? Hematuria 06/25/2016  ? Sepsis ([place]) 06/25/2016  ? Acute cholecystitis   ? Hypokalemia 07/23/2015  ? Generalized weakness 07/23/2015  ? Bladder outlet obstruction 07/23/2015  ? Acute delirium 07/23/2015  ? Benign essential HTN 07/23/2015  ? Acute on chronic renal failure ([place]) 07/20/2015  ? GIB (gastrointestinal bleeding) 06/03/2015  ? Kidney stone 05/25/2012  ? Ureteric stone 05/25/2012  ? Hydronephrosis 05/25/2012  ? Chronic kidney disease, stage IV (severe) ([place]) 05/25/2012  ? ? ?Past Surgical History:  ?Procedure Laterality Date  ? CARDIAC CATHETERIZATION  04/13/2014  ? CT PERC CHOLECYSTOSTOMY  07/20/2016  ? Placed at [facility] Interventional Radiology  ?  ENDOSCOPIC RETROGRADE CHOLANGIOPANCREATOGRAPHY (ERCP) WITH PROPOFOL N/A 04/06/2019  ? Procedure: ENDOSCOPIC RETROGRADE CHOLANGIOPANCREATOGRAPHY (ERCP) WITH PROPOFOL;  Surgeon: Lucilla Lame, MD;  Location: [facility] ENDOSCOPY;  Service: Endoscopy;  Laterality: N/A;  ? ? ? ? ? ?Home Medications   ? ?Prior to Admission medications   ?Medication Sig Start Date End Date Taking? Authorizing Provider  ?ciprofloxacin (CIPRO) 250 MG tablet Take 1 tablet (250 mg total) by mouth 2 (two) times daily. 10/12/21  Yes Rodriguez-Southworth, Sunday Spillers, PA-C  ?aspirin EC 81 MG tablet Take 81 mg by mouth at bedtime.    [provider]  ?atorvastatin (LIPITOR) 40 MG tablet Take 40 mg by mouth at bedtime.      [provider]  ?Cholecalciferol (VITAMIN D3) 5000 units TABS Take 5,000 Units by mouth daily.    [provider]  ?doxazosin (CARDURA) 4 MG tablet Take 4 mg by mouth at bedtime.     [provider]  ?ferrous sulfate 325 (65 FE) MG tablet Take 325 mg by mouth 2 (two) times daily.     [provider]  ?isosorbide mononitrate (IMDUR) 30 MG 24 hr tablet Take 1 tablet (30 mg total) by mouth daily. 03/11/18   [place], Nila Nephew D, DO  ?levothyroxine (SYNTHROID, LEVOTHROID) 88 MCG tablet Take 88 mcg by mouth daily. 02/11/18   [provider]  ?loperamide (IMODIUM) 2 MG capsule Take 2 mg by mouth as needed for diarrhea or loose stools.    [provider]  ?metoprolol succinate (TOPROL-XL) 50 MG 24 hr tablet Take 50 mg by mouth daily. Take with or immediately following a meal.    [provider]  ?Omega-3 Fatty Acids (FISH OIL) 1200 MG CAPS Take 1,200 mg by mouth 2 (two) times daily.    [provider]  ?pantoprazole (PROTONIX) 40 MG tablet Take 40 mg by mouth daily.    [provider]  ?potassium chloride SA (K-DUR,KLOR-CON) 20 MEQ tablet Take 20 mEq by mouth 2 (two) times daily as needed.  08/18/15   [provider]  ?sodium bicarbonate 650 MG tablet Take 1,300 mg by mouth 2 (two) times daily.     [provider]  ?tamsulosin (FLOMAX) 0.4 MG CAPS capsule Take 0.4 mg by mouth at bedtime.     [provider]  ?torsemide (DEMADEX) 20 MG tablet Take 40 mg by mouth in the morning and at bedtime.  10/27/15   [provider]  ? ? ?Family History ?Family History  ?Problem Relation Age of Onset  ? CAD Mother   ? Cancer Mother   ? Heart attack Father   ? ? ?Social History ?Social History  ? ?Tobacco Use  ? Smoking status: Former  ?  Types: Cigarettes, Cigars  ?  Quit date: 1962  ?  Years since quitting: 61.3  ? Smokeless tobacco: Current  ?  Types: Chew  ?Vaping Use  ? Vaping Use: Never used  ?Substance Use Topics  ?  Alcohol use: Not Currently  ? Drug use: No  ? ? ? ?Allergies   ?Ativan [lorazepam], Penicillin g, Amlodipine, Lisinopril, and Spironolactone ? ? ?Review of Systems ?Review of Systems  ?Endocrine:  ?     + weight gain  ? ? ?Physical Exam ?Triage Vital Signs ?ED Triage Vitals  ?Enc Vitals Group  ?   BP   ?   Pulse   ?   Resp   ?   Temp   ?   Temp src   ?   SpO2   ?  Weight   ?   Height   ?   Head Circumference   ?   Peak Flow   ?   Pain Score   ?   Pain Loc   ?   Pain Edu?   ?   Excl. in [place]?   ? ?No data found. ? ?Updated Vital Signs ?BP 102/61 (BP Location: Left Arm)   Pulse 60   Temp 98.2 ?F (36.8 ?C) (Temporal)   Resp 16   Wt 141 lb 3.2 oz (64 kg)   SpO2 97%   BMI 21.47 kg/m?  ? ?Visual Acuity ?Right Eye Distance:   ?Left Eye Distance:   ?Bilateral Distance:   ? ?Right Eye Near:   ?Left Eye Near:    ?Bilateral Near:    ? ?Physical Exam ?Vitals and nursing note reviewed.  ?Constitutional:   ?   General: He is not in acute distress. ?   Appearance: Normal appearance.  ?HENT:  ?   Right Ear: External ear normal.  ?   Left Ear: External ear normal.  ?Eyes:  ?   General: No scleral icterus. ?   Conjunctiva/sclera: Conjunctivae normal.  ?Pulmonary:  ?   Effort: Pulmonary effort is normal.  ?Abdominal:  ?   General: There is distension.  ?   Palpations: Abdomen is soft. There is no mass.  ?   Tenderness: There is no abdominal tenderness. There is no right CVA tenderness or left CVA tenderness.  ?Musculoskeletal:  ?   Cervical back: Neck supple.  ?Skin: ?   General: Skin is warm and dry.  ?Neurological:  ?   Mental Status: He is alert.  ?   Comments: Uses a walker to walk ?His speech is normal, and answers appropriately  ?Psychiatric:     ?   Mood and Affect: Mood normal.     ?   Behavior: Behavior normal.     ?   Thought Content: Thought content normal.     ?   Judgment: Judgment normal.  ? ? ?UC Treatments / Results  ?Labs ?(all labs ordered are listed, but only abnormal results are displayed) ?Labs Reviewed   ?POCT URINALYSIS DIP (MANUAL ENTRY) - Abnormal; Notable for the following components:  ?    Result Value  ? Clarity, UA cloudy (*)   ? Blood, UA trace-intact (*)   ? Leukocytes, UA Large (3+) (*)   ? All other components with

## 2021-10-12 NOTE — ED Triage Notes (Signed)
Patient presents to Urgent Care with daughter-in-law,  states pts grandson and caregiver have noted changes in pts sleep pattern and mental status this week. His grandson also reported patients urine to have a foul odor.  ?

## 2021-10-14 LAB — URINE CULTURE

## 2021-11-23 ENCOUNTER — Telehealth: Payer: Self-pay

## 2021-11-23 ENCOUNTER — Ambulatory Visit
Admission: RE | Admit: 2021-11-23 | Discharge: 2021-11-23 | Disposition: A | Discharge status: Home / Self Care | Payer: Medicare Other | Source: Ambulatory Visit | Attending: Radiation Oncology | Admitting: Radiation Oncology

## 2021-11-23 ENCOUNTER — Encounter: Payer: Self-pay | Admitting: Radiation Oncology

## 2021-11-23 VITALS — BP 111/60 | HR 65 | Resp 20 | Wt 141.1 lb

## 2021-11-23 DIAGNOSIS — Z923 Personal history of irradiation: Secondary | ICD-10-CM | POA: Insufficient documentation

## 2021-11-23 DIAGNOSIS — R972 Elevated prostate specific antigen [PSA]: Secondary | ICD-10-CM

## 2021-11-23 DIAGNOSIS — C61 Malignant neoplasm of prostate: Secondary | ICD-10-CM | POA: Insufficient documentation

## 2021-11-23 DIAGNOSIS — I509 Heart failure, unspecified: Secondary | ICD-10-CM | POA: Insufficient documentation

## 2021-11-23 NOTE — Telephone Encounter (Signed)
Please schedule patient for: Labs mid June MD/Eliguard 2-3 after. Please notify patient of follow up with Dr. Tasia Catchings. Thanks

## 2021-11-23 NOTE — Progress Notes (Signed)
Radiation Oncology Follow up Note  Name: [name]   Date:   11/23/2021 MRN:  [id number] DOB: October 21, 1926    This 88 y.o. male presents to the clinic today for 8-year follow-up status post radiation therapy 8 years prior with biochemical failure being currently treated with ADT therapy by medical oncology.  REFERRING PROVIDER: Ngetich, Nelda Bucks, NP  HPI: Patient is a 88 year old male now out 8 years having completed.  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  He developed biochemical recurrence and has been plagued by congestive heart failure.  I had referred him to medical oncology and he is still being treated with Eligard.  He is having no bone pain at this time.  He has not had a recent PSA.  He has declined PSMA PET scan or any further work-up based on his age.  COMPLICATIONS OF TREATMENT: none  FOLLOW UP COMPLIANCE: keeps appointments   PHYSICAL EXAM:  BP 111/60   Pulse 65   Resp 20   Wt 141 lb 1.6 oz (64 kg)   SpO2 100%   BMI 21.45 kg/m  Frail-appearing wheelchair-bound male in NAD.  Well-developed well-nourished patient in NAD. HEENT reveals PERLA, EOMI, discs not visualized.  Oral cavity is clear. No oral mucosal lesions are identified. Neck is clear without evidence of cervical or supraclavicular adenopathy. Lungs are clear to A&P. Cardiac examination is essentially unremarkable with regular rate and rhythm without murmur rub or thrill. Abdomen is benign with no organomegaly or masses noted. Motor sensory and DTR levels are equal and symmetric in the upper and lower extremities. Cranial nerves II through XII are grossly intact. Proprioception is intact. No peripheral adenopathy or edema is identified. No motor or sensory levels are noted. Crude visual fields are within normal range.  RADIOLOGY RESULTS: No current films for review  PLAN: Present time patient is being followed by medical oncology with ADT therapy.  I am going to discontinue  follow-up care.  I be happy to reevaluate the patient in the future should he develop p bone metastasis that are painful.  Family comprehends my recommendations well.  I would like to take this opportunity to thank you for allowing me to participate in the care of your patient.Noreene Filbert, MD

## 2021-12-14 ENCOUNTER — Telehealth: Payer: Self-pay | Admitting: Oncology

## 2021-12-14 NOTE — Telephone Encounter (Signed)
Pt daughter called to reschedule appt. Wished to push it out to August due to recovering from surgery and unable to bring pt.

## 2021-12-26 ENCOUNTER — Other Ambulatory Visit: Payer: Medicare Other

## 2021-12-26 ENCOUNTER — Encounter: Payer: Self-pay | Admitting: Radiation Oncology

## 2021-12-28 ENCOUNTER — Ambulatory Visit: Payer: Medicare Other

## 2021-12-28 ENCOUNTER — Ambulatory Visit: Payer: Medicare Other | Admitting: Oncology

## 2021-12-28 ENCOUNTER — Ambulatory Visit: Payer: Medicare Other | Admitting: Internal Medicine

## 2022-02-12 ENCOUNTER — Inpatient Hospital Stay: Payer: Medicare Other | Attending: Oncology

## 2022-02-12 DIAGNOSIS — Z5111 Encounter for antineoplastic chemotherapy: Secondary | ICD-10-CM | POA: Insufficient documentation

## 2022-02-12 DIAGNOSIS — C61 Malignant neoplasm of prostate: Secondary | ICD-10-CM | POA: Diagnosis present

## 2022-02-12 DIAGNOSIS — Z79899 Other long term (current) drug therapy: Secondary | ICD-10-CM | POA: Insufficient documentation

## 2022-02-12 DIAGNOSIS — R972 Elevated prostate specific antigen [PSA]: Secondary | ICD-10-CM

## 2022-02-12 LAB — CBC WITH DIFFERENTIAL/PLATELET
Abs Immature Granulocytes: 0.02 10*3/uL (ref 0.00–0.07)
Basophils Absolute: 0 10*3/uL (ref 0.0–0.1)
Basophils Relative: 1 %
Eosinophils Absolute: 0.1 10*3/uL (ref 0.0–0.5)
Eosinophils Relative: 2 %
HCT: 35.1 % — ABNORMAL LOW (ref 39.0–52.0)
Hemoglobin: 11.5 g/dL — ABNORMAL LOW (ref 13.0–17.0)
Immature Granulocytes: 1 %
Lymphocytes Relative: 32 %
Lymphs Abs: 1.3 10*3/uL (ref 0.7–4.0)
MCH: 31.3 pg (ref 26.0–34.0)
MCHC: 32.8 g/dL (ref 30.0–36.0)
MCV: 95.4 fL (ref 80.0–100.0)
Monocytes Absolute: 0.3 10*3/uL (ref 0.1–1.0)
Monocytes Relative: 8 %
Neutro Abs: 2.4 10*3/uL (ref 1.7–7.7)
Neutrophils Relative %: 56 %
Platelets: 100 10*3/uL — ABNORMAL LOW (ref 150–400)
RBC: 3.68 MIL/uL — ABNORMAL LOW (ref 4.22–5.81)
RDW: 15 % (ref 11.5–15.5)
WBC: 4.1 10*3/uL (ref 4.0–10.5)
nRBC: 0 % (ref 0.0–0.2)

## 2022-02-12 LAB — COMPREHENSIVE METABOLIC PANEL
ALT: 12 U/L (ref 0–44)
AST: 17 U/L (ref 15–41)
Albumin: 3 g/dL — ABNORMAL LOW (ref 3.5–5.0)
Alkaline Phosphatase: 70 U/L (ref 38–126)
Anion gap: 9 (ref 5–15)
BUN: 38 mg/dL — ABNORMAL HIGH (ref 8–23)
CO2: 25 mmol/L (ref 22–32)
Calcium: 8.7 mg/dL — ABNORMAL LOW (ref 8.9–10.3)
Chloride: 103 mmol/L (ref 98–111)
Creatinine, Ser: 3.46 mg/dL — ABNORMAL HIGH (ref 0.61–1.24)
GFR, Estimated: 16 mL/min — ABNORMAL LOW (ref >60)
Glucose, Bld: 126 mg/dL — ABNORMAL HIGH (ref 70–99)
Potassium: 4.2 mmol/L (ref 3.5–5.1)
Sodium: 137 mmol/L (ref 135–145)
Total Bilirubin: 0.4 mg/dL (ref 0.3–1.2)
Total Protein: 5.7 g/dL — ABNORMAL LOW (ref 6.5–8.1)

## 2022-02-13 ENCOUNTER — Other Ambulatory Visit: Payer: Medicare Other

## 2022-02-13 ENCOUNTER — Inpatient Hospital Stay: Payer: Medicare Other

## 2022-02-13 ENCOUNTER — Inpatient Hospital Stay (HOSPITAL_BASED_OUTPATIENT_CLINIC_OR_DEPARTMENT_OTHER): Payer: Medicare Other | Admitting: Oncology

## 2022-02-13 ENCOUNTER — Other Ambulatory Visit: Payer: Self-pay

## 2022-02-13 ENCOUNTER — Encounter: Payer: Self-pay | Admitting: Oncology

## 2022-02-13 VITALS — BP 90/64 | HR 71 | Temp 97.9°F | Ht 68.0 in | Wt 137.0 lb

## 2022-02-13 DIAGNOSIS — C61 Malignant neoplasm of prostate: Secondary | ICD-10-CM | POA: Diagnosis not present

## 2022-02-13 DIAGNOSIS — Z79818 Long term (current) use of other agents affecting estrogen receptors and estrogen levels: Secondary | ICD-10-CM

## 2022-02-13 DIAGNOSIS — Z7189 Other specified counseling: Secondary | ICD-10-CM | POA: Diagnosis not present

## 2022-02-13 DIAGNOSIS — Z5111 Encounter for antineoplastic chemotherapy: Secondary | ICD-10-CM | POA: Diagnosis not present

## 2022-02-13 LAB — PSA: Prostatic Specific Antigen: 0.07 ng/mL (ref 0.00–4.00)

## 2022-02-13 MED ORDER — LEUPROLIDE ACETATE (6 MONTH) 45 MG ~~LOC~~ KIT
45.0000 mg | PACK | Freq: Once | SUBCUTANEOUS | Status: AC
  Administered 2022-02-13: 45 mg via SUBCUTANEOUS
  Filled 2022-02-13: qty 45

## 2022-02-13 NOTE — Progress Notes (Addendum)
Hematology/Oncology Progress note [phone] [phone]         Patient Care Team: Ngetich, Nelda Bucks, NP as PCP - General (Family Medicine) Dionisio David, MD as Consulting Physician (Cardiology) Lavonia Dana, MD as Consulting Physician (Nephrology) Noreene Filbert, MD as Referring Physician (Radiation Oncology) Earlie Server, MD as Consulting Physician (Oncology)  REFERRING PROVIDER: Ngetich, Nelda Bucks, NP  CHIEF COMPLAINTS/REASON FOR VISIT:  Follow up for prostate cancer  HISTORY OF PRESENTING ILLNESS:   [name] is a  88 y.o.  male with PMH listed below was seen in consultation at the request of  Ngetich, Dinah C, NP  for evaluation of prostate cancer.   Patient was diagnosed with prostate cancer in 2006. No original pathology report is available in current EMR.  He follows up with Dr.Chrystal. patient is a poor historian.  Per note, he has history of Gleason 7 adenocarcinoma of prostate, previously on pulsatile leupron therapy.  Patient's last Eligard was in December 2023.  Patient had an appointment in June 2023 and this appointment was rescheduled to today. Patient is not interested in any imaging or work-up or aggressive treatments.  He denies any back pain. He is accompanied by Daughter-in-law today.    Review of Systems  Constitutional:  Negative for appetite change, chills, fatigue, fever and unexpected weight change.  HENT:   Negative for hearing loss and voice change.   Eyes:  Negative for eye problems and icterus.  Respiratory:  Negative for chest tightness, cough and shortness of breath.   Cardiovascular:  Negative for chest pain and leg swelling.  Gastrointestinal:  Negative for abdominal distention and abdominal pain.  Endocrine: Negative for hot flashes.  Genitourinary:  Negative for difficulty urinating, dysuria and frequency.   Musculoskeletal:  Positive for arthralgias and back pain.  Skin:  Negative for itching and rash.   Neurological:  Negative for light-headedness and numbness.  Hematological:  Negative for adenopathy. Does not bruise/bleed easily.  Psychiatric/Behavioral:  Negative for confusion.     MEDICAL HISTORY:  Past Medical History:  Diagnosis Date   Anemia    Cardiac disease    CHF (congestive heart failure) (HCC)    Choledocholithiasis    Chronic kidney disease (CKD), stage IV (severe) (HCC)    left   Elevated LFTs    Esophageal stenosis    Hiatal hernia 04/03/2019   large   History of blindness    History of diarrhea    History of gallstones    Hypercholesteremia    Hypertension    Hypothyroidism    Malnutrition (HCC)    Osteoporosis    Prostate cancer ([place])    Sepsis ([place])    Shingles    Shingles    Skin cancer    right cheek    SURGICAL HISTORY: Past Surgical History:  Procedure Laterality Date   CARDIAC CATHETERIZATION  04/13/2014   CT PERC CHOLECYSTOSTOMY  07/20/2016   Placed at [facility] Interventional Radiology   ENDOSCOPIC RETROGRADE CHOLANGIOPANCREATOGRAPHY (ERCP) WITH PROPOFOL N/A 04/06/2019   Procedure: ENDOSCOPIC RETROGRADE CHOLANGIOPANCREATOGRAPHY (ERCP) WITH PROPOFOL;  Surgeon: Lucilla Lame, MD;  Location: [facility] ENDOSCOPY;  Service: Endoscopy;  Laterality: N/A;    SOCIAL HISTORY: Social History   Socioeconomic History   Marital status: Married    Spouse name: Not on file   Number of children: Not on file   Years of education: Not on file   Highest education level: Not on file  Occupational History   Not on file  Tobacco  Use   Smoking status: Former    Types: Cigarettes, Cigars    Quit date: 1962    Years since quitting: 61.6   Smokeless tobacco: Current    Types: Chew  Vaping Use   Vaping Use: Never used  Substance and Sexual Activity   Alcohol use: Not Currently   Drug use: No   Sexual activity: Not Currently  Other Topics Concern   Not on file  Social History Narrative   Tobacco use, amount per day now: Yes chews tobacco several times.    Past tobacco use, amount per day:   How many years did you use tobacco: 80+ years.   Alcohol use (drinks per week): Past beer drinker.   Diet:   Do you drink/eat things with caffeine: No   Marital status:  Married.                                What year were you married?   Do you live in a house, apartment, assisted living, condo, trailer, etc.? House.   Is it one or more stories? 1   How many persons live in your home? 3   Do you have pets in your home?( please list) No.   Highest Level of education completed? High School   Current or past profession: Teacher, adult education.   Do you exercise?  No.                                Type and how often?   Do you have a living will? No   Do you have a DNR form?    No                               If not, do you want to discuss one?   Do you have signed POA/HPOA forms?  No                   If so, please bring to you appointment      Do you have any difficulty bathing or dressing yourself? Yes   Do you have any difficulty preparing food or eating? Yes   Do you have any difficulty managing your medications? Yes   Do you have any difficulty managing your finances? Yes   Do you have any difficulty affording your medications? No   Social Determinants of Radio broadcast assistant Strain: Not on file  Food Insecurity: Not on file  Transportation Needs: Not on file  Physical Activity: Not on file  Stress: Not on file  Social Connections: Not on file  Intimate Partner Violence: Not on file    FAMILY HISTORY: Family History  Problem Relation Age of Onset   CAD Mother    Cancer Mother    Heart attack Father     ALLERGIES:  is allergic to ativan [lorazepam], penicillin g, amlodipine, lisinopril, and spironolactone.  MEDICATIONS:  Current Outpatient Medications  Medication Sig Dispense Refill   aspirin EC 81 MG tablet Take 81 mg by mouth at bedtime.     atorvastatin (LIPITOR) 40 MG tablet Take 40 mg by mouth at bedtime.       Cholecalciferol (VITAMIN D3) 5000 units TABS Take 5,000 Units by mouth daily.     cyanocobalamin (,VITAMIN B-12,) 1000 MCG/ML  injection Inject 1,000 mcg into the muscle every 30 (thirty) days.     ferrous sulfate 325 (65 FE) MG tablet Take 325 mg by mouth 2 (two) times daily.      isosorbide mononitrate (IMDUR) 30 MG 24 hr tablet Take 1 tablet (30 mg total) by mouth daily. 30 tablet 1   levothyroxine (SYNTHROID, LEVOTHROID) 88 MCG tablet Take 88 mcg by mouth daily.  1   loperamide (IMODIUM) 2 MG capsule Take 2 mg by mouth as needed for diarrhea or loose stools.     metoprolol succinate (TOPROL-XL) 50 MG 24 hr tablet Take 50 mg by mouth daily. Take with or immediately following a meal.     Omega-3 Fatty Acids (FISH OIL) 1200 MG CAPS Take 1,200 mg by mouth 2 (two) times daily.     pantoprazole (PROTONIX) 40 MG tablet Take 40 mg by mouth daily.     potassium chloride SA (K-DUR,KLOR-CON) 20 MEQ tablet Take 20 mEq by mouth 2 (two) times daily as needed.      sodium bicarbonate 650 MG tablet Take 1,300 mg by mouth 2 (two) times daily.      tamsulosin (FLOMAX) 0.4 MG CAPS capsule Take 0.4 mg by mouth at bedtime.      torsemide (DEMADEX) 20 MG tablet Take 40 mg by mouth in the morning and at bedtime.      doxazosin (CARDURA) 4 MG tablet Take 4 mg by mouth at bedtime.      No current facility-administered medications for this visit.     PHYSICAL EXAMINATION: ECOG PERFORMANCE STATUS: 3 - Symptomatic, >50% confined to bed Vitals:   02/13/22 1309  BP: 90/64  Pulse: 71  Temp: 97.9 F (36.6 C)   Filed Weights   02/13/22 1309  Weight: 137 lb (62.1 kg)    Physical Exam Constitutional:      General: He is not in acute distress.    Comments: Frail elderly male patient, he sits in a wheelchair.   HENT:     Head: Normocephalic and atraumatic.  Eyes:     General: No scleral icterus. Cardiovascular:     Rate and Rhythm: Normal rate and regular rhythm.     Heart sounds: Normal heart sounds.   Pulmonary:     Effort: Pulmonary effort is normal. No respiratory distress.     Breath sounds: No wheezing.  Abdominal:     General: Bowel sounds are normal. There is no distension.     Palpations: Abdomen is soft.  Musculoskeletal:        General: No deformity. Normal range of motion.     Cervical back: Normal range of motion and neck supple.  Skin:    General: Skin is warm and dry.     Findings: No erythema or rash.  Neurological:     Mental Status: He is alert. Mental status is at baseline.     Cranial Nerves: No cranial nerve deficit.     Coordination: Coordination normal.  Psychiatric:        Mood and Affect: Mood normal.     LABORATORY DATA:  I have reviewed the data as listed    Latest Ref Rng & Units 02/12/2022   12:56 PM 03/25/2020    1:14 AM 03/24/2020   12:35 PM  CBC  WBC 4.0 - 10.5 K/uL 4.1  4.8  3.2   Hemoglobin 13.0 - 17.0 g/dL 11.5  10.0  10.4   Hematocrit 39.0 - 52.0 % 35.1  29.1  32.2  Platelets 150 - 400 K/uL 100  142  149       Latest Ref Rng & Units 02/12/2022   12:56 PM 03/25/2020    1:14 AM 03/24/2020   12:35 PM  CMP  Glucose 70 - 99 mg/dL 126  110  143   BUN 8 - 23 mg/dL 38  33  34   Creatinine 0.61 - 1.24 mg/dL 3.46  2.58  2.72   Sodium 135 - 145 mmol/L 137  139  137   Potassium 3.5 - 5.1 mmol/L 4.2  2.9  2.7   Chloride 98 - 111 mmol/L 103  106  102   CO2 22 - 32 mmol/L '25  25  25   '$ Calcium 8.9 - 10.3 mg/dL 8.7  8.1  8.5   Total Protein 6.5 - 8.1 g/dL 5.7  5.3  5.5   Total Bilirubin 0.3 - 1.2 mg/dL 0.4  0.6  0.8   Alkaline Phos 38 - 126 U/L 70  52  50   AST 15 - 41 U/L '17  21  23   '$ ALT 0 - 44 U/L '12  18  20      '$ Iron/TIBC/Ferritin/ %Sat    Component Value Date/Time   IRON 32 (L) 03/09/2018 1450   TIBC 168 (L) 03/09/2018 1450   FERRITIN 950 (H) 04/03/2019 1552   IRONPCTSAT 19 03/09/2018 1450      RADIOGRAPHIC STUDIES: I have personally reviewed the radiological images as listed and agreed with the findings in the report. No results  found.    ASSESSMENT & PLAN:  1. Malignant neoplasm of prostate ([place])   2. Goals of care, counseling/discussion   3. Androgen deprivation therapy    # History of prostate cancer, risk progressively increased PSA. Patient denies any aggressive work-up or treatments for his prostate cancer. He is only interested in androgen deprivation therapy.  Clinically he is stable.  Denies any pain. Labs reviewed and discussed with patient Proceed with Eligard 45 mg today  Recommend him to establish care with palliative care service for goals of care discussion.   Follow up 6 months, lab MD Eligard   All questions were answered. The patient knows to call the clinic with any problems questions or concerns.  cc Ngetich, Nelda Bucks, NP    Earlie Server, MD, PhD  02/13/2022

## 2022-02-16 ENCOUNTER — Inpatient Hospital Stay (HOSPITAL_BASED_OUTPATIENT_CLINIC_OR_DEPARTMENT_OTHER): Payer: Medicare Other | Admitting: Hospice and Palliative Medicine

## 2022-02-16 DIAGNOSIS — Z515 Encounter for palliative care: Secondary | ICD-10-CM

## 2022-02-16 DIAGNOSIS — C61 Malignant neoplasm of prostate: Secondary | ICD-10-CM | POA: Diagnosis not present

## 2022-02-16 NOTE — Progress Notes (Signed)
Virtual Visit via Telephone Note  I connected with [name] on 02/16/22 at  3:00 PM EDT by telephone and verified that I am speaking with the correct person using two identifiers.  Location: Patient: Home Provider: Clinic   I discussed the limitations, risks, security and privacy concerns of performing an evaluation and management service by telephone and the availability of in person appointments. I also discussed with the patient that there may be a patient responsible charge related to this service. The patient expressed understanding and agreed to proceed.   History of Present Illness: [name] is a 88 year old male with multiple medical problems including history of prostate cancer diagnosed in 2006 previously on Lupron.  Patient is now on every 6 month Eligard but is not interested in any further imaging, work-up or aggressive treatments.  He is referred to palliative care.   Observations/Objective: I called and spoke with patient and daughter-in-law [name]).  Reportedly, patient is doing reasonably well symptomatically with exception of some chronic diarrhea.  He had history of C. difficile colitis earlier this year and was treated on oral vancomycin for that.  Patient's had ongoing diarrhea with apparent plan to repeat C. difficile PCR per PCP.  Patient says he is eating well.  He denies any pain.  Patient has had weakness and fell about a month ago. Uses a walker to ambulate. Home health is following. Family's goal is to improve his strength and functioning.   Daughter-in-law confirms that patient is primarily interested in remaining home and focusing on quality of life.  They are in agreement with community palliative care involvement.  Assessment and Plan: Prostate Cancer -on treatment with Eligard but does not desire further work-up/imaging.  Referral to community palliative care  Follow Up Instructions: Telephone visit 1-2 months   I discussed the  assessment and treatment plan with the patient. The patient was provided an opportunity to ask questions and all were answered. The patient agreed with the plan and demonstrated an understanding of the instructions.   The patient was advised to call back or seek an in-person evaluation if the symptoms worsen or if the condition fails to improve as anticipated.  I provided 10 minutes of non-face-to-face time during this encounter.   Irean Hong, NP

## 2022-02-21 ENCOUNTER — Telehealth: Payer: Medicare Other | Admitting: Nurse Practitioner

## 2022-02-21 DIAGNOSIS — R634 Abnormal weight loss: Secondary | ICD-10-CM

## 2022-02-21 DIAGNOSIS — Z515 Encounter for palliative care: Secondary | ICD-10-CM

## 2022-02-21 DIAGNOSIS — R5381 Other malaise: Secondary | ICD-10-CM

## 2022-02-21 DIAGNOSIS — R630 Anorexia: Secondary | ICD-10-CM

## 2022-02-21 DIAGNOSIS — C61 Malignant neoplasm of prostate: Secondary | ICD-10-CM

## 2022-02-22 ENCOUNTER — Encounter: Payer: Self-pay | Admitting: Nurse Practitioner

## 2022-02-22 NOTE — Progress Notes (Signed)
[facility] Consult Note Telephone: [phone]  Fax: [phone]    Date of encounter: 02/22/22 11:38 AM PATIENT NAME: [name] Sheppard [address] [place] [address]   [phone] (home)  DOB: Apr 26, 1927 MRN: [id number] PRIMARY CARE PROVIDER:    Sandrea Hughs, NP,  [place] [address] [phone]  REFERRING PROVIDER:   Sandrea Hughs, NP [address] [place],  [place] [address] [phone]  RESPONSIBLE PARTY:    Contact Information     Name Relation Home Work Mobile   [name] Sheppard Spouse [phone]  [phone]   [name] Sheppard Relative   [phone]   [name] Sheppard,[name] Sheppard Relative Milaca, Consuela Mimes   [phone]      Due to the COVID-19 crisis, this visit was done via telemedicine from my office and it was initiated and consent by this patient and or family.  I connected with Ms. [name] Sheppard daughter in law with [name] Sheppard OR [facility] on 02/22/22 by telephone as video did not work, enabled telemedicine application and verified that I am speaking with the correct person using two identifiers.   I discussed the limitations of evaluation and management by telemedicine. The patient expressed understanding and agreed to proceed. Palliative Care was asked to follow this patient by consultation request of  Ngetich, Dinah C, NP to address advance care planning and complex medical decision making. This is a follow up visit.                                  ASSESSMENT AND PLAN / RECOMMENDATIONS:  Symptom Management/Plan: 1. Advance Care Planning;  ongoing discussions; did talk about hospice benefit under medicare benefit. Prostate cancer with mets, continues on lupron with wishes to continue for now.  2. Goals of Care: Goals include to maximize quality of life and symptom management. Our advance care planning conversation included a discussion about:    The value and importance of advance  care planning  Exploration of personal, cultural or spiritual beliefs that might influence medical decisions  Exploration of goals of care in the event of a sudden injury or illness  Identification and preparation of a healthcare agent  Review and updating or creation of an advance directive document.  3. Debility secondary to decompensation, generalized weakness from prostate cancer with mets, continues on lupron with wishes to continue for now. Encourage rest, energy conservation. Fall risk  4. Anorexia; weight loss, discussed nutrition, supplements 07/18/2021 weight 143 lbs 10/18/2021 weight 149.6 lbs 02/13/2022 weight 137 lbs 12.6 lbs loss/4 months; 8.42% 5. Palliative care encounter; Palliative care encounter; Palliative medicine team will continue to support patient, patient's family, and medical team. Visit consisted of counseling and education dealing with the complex and emotionally intense issues of symptom management and palliative care in the setting of serious and potentially life-threatening illness  Follow up Palliative Care Visit: Palliative care will continue to follow for complex medical decision making, advance care planning, and clarification of goals. Return 4 weeks or prn.  I spent 37 minutes providing this consultation. More than 50% of the time in this consultation was spent in counseling and care coordination. PPS: 40% Chief Complaint: Initial palliative consult for complex medical decision making, address goals, manage ongoing symptoms  HISTORY OF PRESENT ILLNESS:  [name] Sheppard is a 88 y.o. year old male  with multiple medical problems  including prostate cancer with mets, anemia, CHF, h/o choledocholithiasis, CKD IV, h/o esophageal stenosis, h/o hiatal hernia, h/o blindness, malnutrition, osteoporosis. I connect by video though did not work and used telephonic with [name] Sheppard [name] Sheppard daughter in law with [name] Sheppard. We talked about purpose of pc visit, past medical history,  ros, functional and cognitive abilities. We talked about prostate cancer at length with lupron injections. We talked about medical goals including hospice benefit through medicare program. We talked about appetite, weight loss, weakness, nutrition. We talked about difficulty getting to MD appointment, talked about in home primary care, [facility], will refer. Discussed role pc in poc. Discussed will continue to f/u in next 2 to 3 weeks for measurements, weights and ongoing monitoring as close to being eligible for hospice should continue to loose weight if chooses to stop lupron injections. F/u PC visit scheduled. Therapeutic listening, emotional support provided. Questions answered. Discussed family and social dynamics, in home caregivers with family members also rotating care.   History obtained from review of EMR, discussion with [name] Sheppard daughter in law with [name] Sheppard.  I reviewed available labs, medications, imaging, studies and related documents from the EMR.  Records reviewed and summarized above.   ROS 10 point system reviewed all negative except HPI  Physical Exam: deferred Thank you for the opportunity to participate in the care of [name]. [name] Sheppard.  The palliative care team will continue to follow. Please call our office at [phone] if we can be of additional assistance.   Smt. Loder Ihor Gully, NP

## 2022-03-03 ENCOUNTER — Ambulatory Visit (INDEPENDENT_AMBULATORY_CARE_PROVIDER_SITE_OTHER): Payer: Medicare Other

## 2022-03-03 ENCOUNTER — Ambulatory Visit
Admission: EM | Admit: 2022-03-03 | Discharge: 2022-03-03 | Disposition: A | Discharge status: Home / Self Care | Payer: Medicare Other | Attending: Emergency Medicine | Admitting: Emergency Medicine

## 2022-03-03 DIAGNOSIS — M545 Low back pain, unspecified: Secondary | ICD-10-CM | POA: Diagnosis present

## 2022-03-03 DIAGNOSIS — R051 Acute cough: Secondary | ICD-10-CM

## 2022-03-03 DIAGNOSIS — Z20822 Contact with and (suspected) exposure to covid-19: Secondary | ICD-10-CM | POA: Insufficient documentation

## 2022-03-03 DIAGNOSIS — J069 Acute upper respiratory infection, unspecified: Secondary | ICD-10-CM | POA: Diagnosis present

## 2022-03-03 LAB — POCT URINALYSIS DIP (MANUAL ENTRY)
Bilirubin, UA: NEGATIVE
Glucose, UA: NEGATIVE mg/dL
Ketones, POC UA: NEGATIVE mg/dL
Leukocytes, UA: NEGATIVE
Nitrite, UA: NEGATIVE
Protein Ur, POC: 30 mg/dL — AB
Spec Grav, UA: 1.015 (ref 1.010–1.025)
Urobilinogen, UA: 0.2 E.U./dL
pH, UA: 6 (ref 5.0–8.0)

## 2022-03-03 MED ORDER — BENZONATATE 100 MG PO CAPS: 100.0000 mg | ORAL_CAPSULE | Freq: Three times a day (TID) | ORAL | 0 refills | Status: DC | PRN

## 2022-03-03 NOTE — ED Provider Notes (Signed)
[facility]    CSN: [id number] Arrival date & time: 03/03/22  1511      History   Chief Complaint Chief Complaint  Patient presents with   Cough   Generalized Body Aches    HPI [name] is a 88 y.o. male.  Accompanied by his daughter-in-law, patient presents with sore throat, cough, low back pain x 2 days.  No fever, rash, chest pain, shortness of breath, vomiting, unusual diarrhea, or other symptoms.  No treatments attempted at home.  His medical history includes hypertension, heart failure, chronic renal failure, CKD 4, kidney stones, GI bleed.  The history is provided by the patient, a relative and medical records.    Past Medical History:  Diagnosis Date   Anemia    Cardiac disease    CHF (congestive heart failure) ([place])    Choledocholithiasis    Chronic kidney disease (CKD), stage IV (severe) (HCC)    left   Elevated LFTs    Esophageal stenosis    Hiatal hernia 04/03/2019   large   History of blindness    History of diarrhea    History of gallstones    Hypercholesteremia    Hypertension    Hypothyroidism    Malnutrition (H[facility])    Osteoporosis    Prostate cancer ([place])    Sepsis ([place])    Shingles    Shingles    Skin cancer    right cheek    Patient Active Problem List   Diagnosis Date Noted   Goals of care, counseling/discussion 02/13/2022   Androgen deprivation therapy 02/13/2022   Elevated PSA 06/20/2021   Low BP 04/08/2020   Kidney failure 04/08/2020   Congestive heart failure ([place]) 04/08/2020   Acidosis 05/27/2019   Anemia in chronic kidney disease 05/27/2019   Benign hypertensive kidney disease with chronic kidney disease 05/27/2019   Proteinuria 05/27/2019   Secondary hyperparathyroidism of renal origin ([place]) 05/27/2019   Blind 04/08/2019   CAD (coronary artery disease) 04/08/2019   Thrombocytopathia ([place]) 04/08/2019   Choledocholithiasis    Elevated liver enzymes    Stricture and stenosis of esophagus    Malnutrition of  moderate degree 04/03/2019   Chronic diastolic (congestive) heart failure ([place]) 03/31/2019   Malignant neoplasm of prostate ([place]) 03/31/2019   Weakness    Elevated LFTs    Chronic kidney disease, stage 4 (severe) ([place])    Aortic atherosclerosis ([place])    Bilateral nephrolithiasis    Hydronephrosis concurrent with and due to calculi of kidney and ureter    Acute encephalopathy 03/08/2018   Gallbladder abscess 07/31/2016   Cholecystitis 07/20/2016   Hematuria 06/25/2016   Sepsis ([place]) 06/25/2016   Acute cholecystitis    Hypokalemia 07/23/2015   Generalized weakness 07/23/2015   Bladder outlet obstruction 07/23/2015   Acute delirium 07/23/2015   Benign essential HTN 07/23/2015   Acute on chronic renal failure ([place]) 07/20/2015   GIB (gastrointestinal bleeding) 06/03/2015   Kidney stone 05/25/2012   Ureteric stone 05/25/2012   Hydronephrosis 05/25/2012   Chronic kidney disease, stage IV (severe) ([place]) 05/25/2012    Past Surgical History:  Procedure Laterality Date   CARDIAC CATHETERIZATION  04/13/2014   CT PERC CHOLECYSTOSTOMY  07/20/2016   [facility] at [facility] Interventional Radiology   ENDOSCOPIC RETROGRADE CHOLANGIOPANCREATOGRAPHY (ERCP) WITH PROPOFOL N/A 04/06/2019   Procedure: ENDOSCOPIC RETROGRADE CHOLANGIOPANCREATOGRAPHY (ERCP) WITH PROPOFOL;  Surgeon: Lucilla Lame, MD;  Location: [facility] ENDOSCOPY;  Service: Endoscopy;  Laterality: N/A;       Home Medications  Prior to Admission medications   Medication Sig Start Date End Date Taking? Authorizing Provider  benzonatate (TESSALON) 100 MG capsule Take 1 capsule (100 mg total) by mouth 3 (three) times daily as needed for cough. 03/03/22  Yes Sharion Balloon, NP  aspirin EC 81 MG tablet Take 81 mg by mouth at bedtime.    [provider]  atorvastatin (LIPITOR) 40 MG tablet Take 40 mg by mouth at bedtime.     [provider]  Cholecalciferol (VITAMIN D3) 5000 units TABS Take 5,000 Units by mouth daily.    [provider]  cyanocobalamin (,VITAMIN B-12,) 1000 MCG/ML injection Inject 1,000 mcg into the muscle every 30 (thirty) days. 11/08/21   [provider]  doxazosin (CARDURA) 4 MG tablet Take 4 mg by mouth at bedtime.     [provider]  ferrous sulfate 325 (65 FE) MG tablet Take 325 mg by mouth 2 (two) times daily.     [provider]  isosorbide mononitrate (IMDUR) 30 MG 24 hr tablet Take 1 tablet (30 mg total) by mouth daily. 03/11/18   [place], Carley D, DO  levothyroxine (SYNTHROID, LEVOTHROID) 88 MCG tablet Take 88 mcg by mouth daily. 02/11/18   [provider]  loperamide (IMODIUM) 2 MG capsule Take 2 mg by mouth as needed for diarrhea or loose stools.    [provider]  metoprolol succinate (TOPROL-XL) 50 MG 24 hr tablet Take 50 mg by mouth daily. Take with or immediately following a meal.    [provider]  Omega-3 Fatty Acids (FISH OIL) 1200 MG CAPS Take 1,200 mg by mouth 2 (two) times daily.    [provider]  pantoprazole (PROTONIX) 40 MG tablet Take 40 mg by mouth daily.    [provider]  potassium chloride SA (K-DUR,KLOR-CON) 20 MEQ tablet Take 20 mEq by mouth 2 (two) times daily as needed.  08/18/15   [provider]  sodium bicarbonate 650 MG tablet Take 1,300 mg by mouth 2 (two) times daily.     [provider]  tamsulosin (FLOMAX) 0.4 MG CAPS capsule Take 0.4 mg by mouth at bedtime.     [provider]  torsemide (DEMADEX) 20 MG tablet Take 40 mg by mouth in the morning and at bedtime.  10/27/15   [provider]    Family History Family History  Problem Relation Age of Onset   CAD Mother    Cancer Mother    Heart attack Father     Social History Social History   Tobacco Use   Smoking status: Former    Types: Cigarettes, Cigars    Quit date: 1962    Years since quitting: 61.6   Smokeless tobacco: Current    Types: Chew  Vaping Use   Vaping Use: Never used   Substance Use Topics   Alcohol use: Not Currently   Drug use: No     Allergies   Ativan [lorazepam], Penicillin g, Amlodipine, Lisinopril, and Spironolactone   Review of Systems Review of Systems  Constitutional:  Negative for chills and fever.  HENT:  Positive for sore throat. Negative for ear pain.   Respiratory:  Positive for cough. Negative for shortness of breath.   Cardiovascular:  Negative for chest pain and palpitations.  Gastrointestinal:  Negative for abdominal pain and vomiting.  Genitourinary:  Negative for dysuria and hematuria.  Musculoskeletal:  Positive for back pain.  Skin:  Negative for rash.  All other systems reviewed and  are negative.    Physical Exam Triage Vital Signs ED Triage Vitals  Enc Vitals Group     BP 03/03/22 1521 119/67     Pulse Rate 03/03/22 1521 81     Resp 03/03/22 1521 18     Temp 03/03/22 1521 97.9 F (36.6 C)     Temp src --      SpO2 03/03/22 1521 94 %     Weight 03/03/22 1525 137 lb 0.1 oz (62.1 kg)     Height 03/03/22 1525 '5\' 8"'$  (1.727 m)     Head Circumference --      Peak Flow --      Pain Score 03/03/22 1524 3     Pain Loc --      Pain Edu? --      Excl. in [place]? --    No data found.  Updated Vital Signs BP 119/67   Pulse 81   Temp 97.9 F (36.6 C)   Resp 18   Ht '5\' 8"'$  (1.727 m)   Wt 137 lb 0.1 oz (62.1 kg)   SpO2 94%   BMI 20.83 kg/m   Visual Acuity Right Eye Distance:   Left Eye Distance:   Bilateral Distance:    Right Eye Near:   Left Eye Near:    Bilateral Near:     Physical Exam Vitals and nursing note reviewed.  Constitutional:      General: He is not in acute distress.    Appearance: He is well-developed.  HENT:     Right Ear: Tympanic membrane and ear canal normal.     Left Ear: There is impacted cerumen.     Ears:     Comments: Unable to visualize left TM due to cerumen impaction.    Nose: Nose normal.     Mouth/Throat:     Mouth: Mucous membranes are moist.     Pharynx: Oropharynx  is clear.  Cardiovascular:     Rate and Rhythm: Normal rate and regular rhythm.     Heart sounds: Normal heart sounds.  Pulmonary:     Effort: Pulmonary effort is normal. No respiratory distress.     Breath sounds: Normal breath sounds.  Abdominal:     Palpations: Abdomen is soft.     Tenderness: There is no abdominal tenderness.  Musculoskeletal:     Cervical back: Neck supple.  Skin:    General: Skin is warm and dry.  Neurological:     Mental Status: He is alert.  Psychiatric:        Mood and Affect: Mood normal.        Behavior: Behavior normal.      UC Treatments / Results  Labs (all labs ordered are listed, but only abnormal results are displayed) Labs Reviewed  POCT URINALYSIS DIP (MANUAL ENTRY) - Abnormal; Notable for the following components:      Result Value   Clarity, UA cloudy (*)    Blood, UA small (*)    Protein Ur, POC =30 (*)    All other components within normal limits  SARS CORONAVIRUS 2 (TAT 6-24 HRS)    EKG   Radiology DG Chest 2 View  Result Date: 03/03/2022 CLINICAL DATA:  Productive cough for 2 days. EXAM: CHEST - 2 VIEW COMPARISON:  03/24/2020 FINDINGS: Heart size is within normal limits. Ectasia and tortuosity of thoracic aorta noted. A large hiatal hernia is increased in size since previous study. Both lungs are clear. IMPRESSION: No active cardiopulmonary disease. Large  hiatal hernia, increased in size since prior study. Electronically Signed   By: Marlaine Hind M.D.   On: 03/03/2022 16:10    Procedures Procedures (including critical care time)  Medications Ordered in UC Medications - No data to display  Initial Impression / Assessment and Plan / UC Course  I have reviewed the triage vital signs and the nursing notes.  Pertinent labs & imaging results that were available during my care of the patient were reviewed by me and considered in my medical decision making (see chart for details).    Viral URI, back pain.  Afebrile, vital signs  are stable.  No respiratory distress.  Chest x-ray negative for acute cardiopulmonary disease.  COVID pending.  Urine does not show signs of infection.  Treating cough with Tessalon Perles.  Discussed Tylenol as needed for discomfort.  Instructed patient's daughter-in-law to follow-up with his PCP on Monday.  Patient and his daughter-in-law agreed to plan of care.  Final Clinical Impressions(s) / UC Diagnoses   Final diagnoses:  Viral URI  Bilateral low back pain, unspecified chronicity, unspecified whether sciatica present     Discharge Instructions      Give Mr. Wissmann the [employer] as needed for cough.  Follow up with his primary care provider on Monday.        ED Prescriptions     Medication Sig Dispense Auth. Provider   benzonatate (TESSALON) 100 MG capsule Take 1 capsule (100 mg total) by mouth 3 (three) times daily as needed for cough. 21 capsule Sharion Balloon, NP      PDMP not reviewed this encounter.   Sharion Balloon, NP 03/03/22 1622

## 2022-03-03 NOTE — ED Triage Notes (Signed)
Patient to Urgent Care with complaints of productive cough, sore throat, and mid to lower back pain. Symptoms started 2 days ago. Reports that his bilateral ears are congested. Back pain in shoulders (chronic pain) from prior injury.   Caregiver just returned from vacation but has had cold symptoms. Negative covid test.

## 2022-03-03 NOTE — Discharge Instructions (Addendum)
Give Mr. Spake the [employer] as needed for cough.  Follow up with his primary care provider on Monday.

## 2022-03-04 LAB — SARS CORONAVIRUS 2 (TAT 6-24 HRS): SARS Coronavirus 2: NEGATIVE

## 2022-03-21 ENCOUNTER — Encounter: Payer: Self-pay | Admitting: Radiation Oncology

## 2022-03-21 ENCOUNTER — Telehealth: Payer: Medicare Other | Admitting: Nurse Practitioner

## 2022-04-18 ENCOUNTER — Inpatient Hospital Stay: Payer: Medicare Other | Attending: Oncology | Admitting: Hospice and Palliative Medicine

## 2022-04-18 DIAGNOSIS — Z515 Encounter for palliative care: Secondary | ICD-10-CM

## 2022-04-18 DIAGNOSIS — C61 Malignant neoplasm of prostate: Secondary | ICD-10-CM

## 2022-04-18 NOTE — Progress Notes (Signed)
I called and spoke with patient's daughter-in-law, Santiago Glad.  She was not with patient so was unable to speak with him directly.  However, she reports that he is doing reasonably well and denies significant changes or concerns.  She does say that patient has had a recent right upper respiratory infection and is on steroids per his PCP for that.  Patient continues to be followed by community palliative care.  Appreciate their assistance.

## 2022-05-02 ENCOUNTER — Telehealth: Payer: Self-pay | Admitting: Nurse Practitioner

## 2022-06-11 ENCOUNTER — Other Ambulatory Visit: Payer: Self-pay | Admitting: Unknown Physician Specialty

## 2022-06-11 DIAGNOSIS — R221 Localized swelling, mass and lump, neck: Secondary | ICD-10-CM

## 2022-06-12 ENCOUNTER — Emergency Department (HOSPITAL_COMMUNITY): Payer: Medicare Other

## 2022-06-12 ENCOUNTER — Inpatient Hospital Stay (HOSPITAL_COMMUNITY)
Admission: EM | Admit: 2022-06-12 | Discharge: 2022-06-14 | DRG: 064 | Disposition: A | Discharge status: Home Health | Payer: Medicare Other | Attending: Family Medicine | Admitting: Family Medicine

## 2022-06-12 DIAGNOSIS — R410 Disorientation, unspecified: Secondary | ICD-10-CM | POA: Diagnosis not present

## 2022-06-12 DIAGNOSIS — Z6820 Body mass index (BMI) 20.0-20.9, adult: Secondary | ICD-10-CM

## 2022-06-12 DIAGNOSIS — R29717 NIHSS score 17: Secondary | ICD-10-CM | POA: Diagnosis present

## 2022-06-12 DIAGNOSIS — Z8249 Family history of ischemic heart disease and other diseases of the circulatory system: Secondary | ICD-10-CM

## 2022-06-12 DIAGNOSIS — G928 Other toxic encephalopathy: Secondary | ICD-10-CM | POA: Diagnosis present

## 2022-06-12 DIAGNOSIS — I6381 Other cerebral infarction due to occlusion or stenosis of small artery: Secondary | ICD-10-CM | POA: Diagnosis not present

## 2022-06-12 DIAGNOSIS — E860 Dehydration: Secondary | ICD-10-CM | POA: Diagnosis present

## 2022-06-12 DIAGNOSIS — I130 Hypertensive heart and chronic kidney disease with heart failure and stage 1 through stage 4 chronic kidney disease, or unspecified chronic kidney disease: Secondary | ICD-10-CM | POA: Diagnosis present

## 2022-06-12 DIAGNOSIS — G9341 Metabolic encephalopathy: Secondary | ICD-10-CM | POA: Insufficient documentation

## 2022-06-12 DIAGNOSIS — Z8619 Personal history of other infectious and parasitic diseases: Secondary | ICD-10-CM

## 2022-06-12 DIAGNOSIS — Z888 Allergy status to other drugs, medicaments and biological substances status: Secondary | ICD-10-CM

## 2022-06-12 DIAGNOSIS — Z87891 Personal history of nicotine dependence: Secondary | ICD-10-CM

## 2022-06-12 DIAGNOSIS — F1110 Opioid abuse, uncomplicated: Secondary | ICD-10-CM | POA: Diagnosis present

## 2022-06-12 DIAGNOSIS — R630 Anorexia: Secondary | ICD-10-CM | POA: Diagnosis present

## 2022-06-12 DIAGNOSIS — I639 Cerebral infarction, unspecified: Secondary | ICD-10-CM | POA: Insufficient documentation

## 2022-06-12 DIAGNOSIS — R627 Adult failure to thrive: Secondary | ICD-10-CM | POA: Diagnosis present

## 2022-06-12 DIAGNOSIS — F05 Delirium due to known physiological condition: Secondary | ICD-10-CM | POA: Diagnosis present

## 2022-06-12 DIAGNOSIS — H547 Unspecified visual loss: Secondary | ICD-10-CM | POA: Diagnosis present

## 2022-06-12 DIAGNOSIS — Z85828 Personal history of other malignant neoplasm of skin: Secondary | ICD-10-CM

## 2022-06-12 DIAGNOSIS — D631 Anemia in chronic kidney disease: Secondary | ICD-10-CM | POA: Diagnosis present

## 2022-06-12 DIAGNOSIS — N179 Acute kidney failure, unspecified: Secondary | ICD-10-CM | POA: Diagnosis present

## 2022-06-12 DIAGNOSIS — I5032 Chronic diastolic (congestive) heart failure: Secondary | ICD-10-CM | POA: Diagnosis present

## 2022-06-12 DIAGNOSIS — E7800 Pure hypercholesterolemia, unspecified: Secondary | ICD-10-CM | POA: Diagnosis present

## 2022-06-12 DIAGNOSIS — N184 Chronic kidney disease, stage 4 (severe): Secondary | ICD-10-CM | POA: Diagnosis present

## 2022-06-12 DIAGNOSIS — E039 Hypothyroidism, unspecified: Secondary | ICD-10-CM | POA: Diagnosis present

## 2022-06-12 DIAGNOSIS — Z8673 Personal history of transient ischemic attack (TIA), and cerebral infarction without residual deficits: Secondary | ICD-10-CM

## 2022-06-12 DIAGNOSIS — Z8546 Personal history of malignant neoplasm of prostate: Secondary | ICD-10-CM

## 2022-06-12 DIAGNOSIS — Z1152 Encounter for screening for COVID-19: Secondary | ICD-10-CM

## 2022-06-12 DIAGNOSIS — M810 Age-related osteoporosis without current pathological fracture: Secondary | ICD-10-CM | POA: Diagnosis present

## 2022-06-12 LAB — COMPREHENSIVE METABOLIC PANEL
ALT: 11 U/L (ref 0–44)
AST: 15 U/L (ref 15–41)
Albumin: 2.7 g/dL — ABNORMAL LOW (ref 3.5–5.0)
Alkaline Phosphatase: 59 U/L (ref 38–126)
Anion gap: 8 (ref 5–15)
BUN: 40 mg/dL — ABNORMAL HIGH (ref 8–23)
CO2: 25 mmol/L (ref 22–32)
Calcium: 8.6 mg/dL — ABNORMAL LOW (ref 8.9–10.3)
Chloride: 104 mmol/L (ref 98–111)
Creatinine, Ser: 3.58 mg/dL — ABNORMAL HIGH (ref 0.61–1.24)
GFR, Estimated: 15 mL/min — ABNORMAL LOW (ref >60)
Glucose, Bld: 87 mg/dL (ref 70–99)
Potassium: 3.8 mmol/L (ref 3.5–5.1)
Sodium: 137 mmol/L (ref 135–145)
Total Bilirubin: 0.8 mg/dL (ref 0.3–1.2)
Total Protein: 5 g/dL — ABNORMAL LOW (ref 6.5–8.1)

## 2022-06-12 LAB — RESP PANEL BY RT-PCR (FLU A&B, COVID) ARPGX2
Influenza A by PCR: NEGATIVE
Influenza B by PCR: NEGATIVE
SARS Coronavirus 2 by RT PCR: NEGATIVE

## 2022-06-12 LAB — URINALYSIS, ROUTINE W REFLEX MICROSCOPIC
Bacteria, UA: NONE SEEN
Bilirubin Urine: NEGATIVE
Glucose, UA: >=500 mg/dL — AB
Hgb urine dipstick: NEGATIVE
Ketones, ur: NEGATIVE mg/dL
Leukocytes,Ua: NEGATIVE
Nitrite: NEGATIVE
Protein, ur: NEGATIVE mg/dL
Specific Gravity, Urine: 1.008 (ref 1.005–1.030)
pH: 7 (ref 5.0–8.0)

## 2022-06-12 LAB — RAPID URINE DRUG SCREEN, HOSP PERFORMED
Amphetamines: NOT DETECTED
Barbiturates: NOT DETECTED
Benzodiazepines: NOT DETECTED
Cocaine: NOT DETECTED
Opiates: POSITIVE — AB
Tetrahydrocannabinol: NOT DETECTED

## 2022-06-12 MED ORDER — NALOXONE HCL 0.4 MG/ML IJ SOLN: 0.4000 mg | Freq: Once | INTRAMUSCULAR | Status: DC

## 2022-06-12 MED ORDER — NALOXONE HCL 2 MG/2ML IJ SOSY: 2.0000 mg | PREFILLED_SYRINGE | Freq: Once | INTRAMUSCULAR | Status: AC

## 2022-06-12 MED ORDER — NALOXONE HCL 0.4 MG/ML IJ SOLN
0.4000 mg | Freq: Once | INTRAMUSCULAR | Status: AC
  Administered 2022-06-12: 0.4 mg via INTRAVENOUS
  Filled 2022-06-12: qty 1

## 2022-06-12 MED ORDER — NALOXONE HCL 2 MG/2ML IJ SOSY
PREFILLED_SYRINGE | INTRAMUSCULAR | Status: AC
  Administered 2022-06-12: 2 mg via INTRAVENOUS
  Filled 2022-06-12: qty 2

## 2022-06-12 MED ORDER — LACTATED RINGERS IV BOLUS
1000.0000 mL | Freq: Once | INTRAVENOUS | Status: AC
  Administered 2022-06-12: 1000 mL via INTRAVENOUS

## 2022-06-12 NOTE — ED Provider Notes (Signed)
[facility] EMERGENCY DEPARTMENT Provider Note  CSN: [id number] Arrival date & time: 06/12/22 9767  Chief Complaint(s) Altered Mental Status  HPI [name] is a 88 y.o. male with PMH CHF, CKD 4, malnutrition, prostate cancer with home hospice who presents to the emergency department for evaluation of altered mental status and delirium.  History obtained from patient's daughter who states that the patient is usually ambulatory and able to interact in conversation.  Over the last 24 hours patient's caregiver has noticed a significant decline in the patient's mental status and patient unable to walk and has become persistently somnolent.  Here in the emergency department, patient will not answer any of my questions and will just stare blankly when asked.  Daughter states that there is not a formal diagnosis of dementia but this changes been very rapid.   Past Medical History Past Medical History:  Diagnosis Date   Anemia    Cardiac disease    CHF (congestive heart failure) ([place])    Choledocholithiasis    Chronic kidney disease (CKD), stage IV (severe) (HCC)    left   Elevated LFTs    Esophageal stenosis    Hiatal hernia 04/03/2019   large   History of blindness    History of diarrhea    History of gallstones    Hypercholesteremia    Hypertension    Hypothyroidism    Malnutrition (HCC)    Osteoporosis    Prostate cancer ([place])    Sepsis ([place])    Shingles    Shingles    Skin cancer    right cheek   Patient Active Problem List   Diagnosis Date Noted   Goals of care, counseling/discussion 02/13/2022   Androgen deprivation therapy 02/13/2022   Elevated PSA 06/20/2021   Low BP 04/08/2020   Kidney failure 04/08/2020   Congestive heart failure ([place]) 04/08/2020   Acidosis 05/27/2019   Anemia in chronic kidney disease 05/27/2019   Benign hypertensive kidney disease with chronic kidney disease 05/27/2019   Proteinuria 05/27/2019   Secondary  hyperparathyroidism of renal origin ([place]) 05/27/2019   Blind 04/08/2019   CAD (coronary artery disease) 04/08/2019   Thrombocytopathia ([place]) 04/08/2019   Choledocholithiasis    Elevated liver enzymes    Stricture and stenosis of esophagus    Malnutrition of moderate degree 04/03/2019   Chronic diastolic (congestive) heart failure ([place]) 03/31/2019   Malignant neoplasm of prostate ([place]) 03/31/2019   Weakness    Elevated LFTs    Chronic kidney disease, stage 4 (severe) ([place])    Aortic atherosclerosis ([place])    Bilateral nephrolithiasis    Hydronephrosis concurrent with and due to calculi of kidney and ureter    Acute encephalopathy 03/08/2018   Gallbladder abscess 07/31/2016   Cholecystitis 07/20/2016   Hematuria 06/25/2016   Sepsis ([place]) 06/25/2016   Acute cholecystitis    Hypokalemia 07/23/2015   Generalized weakness 07/23/2015   Bladder outlet obstruction 07/23/2015   Acute delirium 07/23/2015   Benign essential HTN 07/23/2015   Acute on chronic renal failure ([place]) 07/20/2015   GIB (gastrointestinal bleeding) 06/03/2015   Kidney stone 05/25/2012   Ureteric stone 05/25/2012   Hydronephrosis 05/25/2012   Chronic kidney disease, stage IV (severe) ([place]) 05/25/2012   Home Medication(s) Prior to Admission medications   Medication Sig Start Date End Date Taking? Authorizing Provider  aspirin EC 81 MG tablet Take 81 mg by mouth at bedtime.    [provider]  atorvastatin (LIPITOR) 40 MG tablet Take 40  mg by mouth at bedtime.     [provider]  benzonatate (TESSALON) 100 MG capsule Take 1 capsule (100 mg total) by mouth 3 (three) times daily as needed for cough. 03/03/22   Sharion Balloon, NP  Cholecalciferol (VITAMIN D3) 5000 units TABS Take 5,000 Units by mouth daily.    [provider]  cyanocobalamin (,VITAMIN B-12,) 1000 MCG/ML injection Inject 1,000 mcg into the muscle every 30 (thirty) days. 11/08/21   [provider]  doxazosin (CARDURA) 4 MG  tablet Take 4 mg by mouth at bedtime.     [provider]  ferrous sulfate 325 (65 FE) MG tablet Take 325 mg by mouth 2 (two) times daily.     [provider]  isosorbide mononitrate (IMDUR) 30 MG 24 hr tablet Take 1 tablet (30 mg total) by mouth daily. 03/11/18   [place], Carley D, DO  levothyroxine (SYNTHROID, LEVOTHROID) 88 MCG tablet Take 88 mcg by mouth daily. 02/11/18   [provider]  loperamide (IMODIUM) 2 MG capsule Take 2 mg by mouth as needed for diarrhea or loose stools.    [provider]  metoprolol succinate (TOPROL-XL) 50 MG 24 hr tablet Take 50 mg by mouth daily. Take with or immediately following a meal.    [provider]  Omega-3 Fatty Acids (FISH OIL) 1200 MG CAPS Take 1,200 mg by mouth 2 (two) times daily.    [provider]  pantoprazole (PROTONIX) 40 MG tablet Take 40 mg by mouth daily.    [provider]  potassium chloride SA (K-DUR,KLOR-CON) 20 MEQ tablet Take 20 mEq by mouth 2 (two) times daily as needed.  08/18/15   [provider]  sodium bicarbonate 650 MG tablet Take 1,300 mg by mouth 2 (two) times daily.     [provider]  tamsulosin (FLOMAX) 0.4 MG CAPS capsule Take 0.4 mg by mouth at bedtime.     [provider]  torsemide (DEMADEX) 20 MG tablet Take 40 mg by mouth in the morning and at bedtime.  10/27/15   [provider]                                                                                                                                    Past Surgical History Past Surgical History:  Procedure Laterality Date   CARDIAC CATHETERIZATION  04/13/2014   CT PERC CHOLECYSTOSTOMY  07/20/2016   Pl[facility] at [facility] Interventional Radiology   ENDOSCOPIC RETROGRADE CHOLANGIOPANCREATOGRAPHY (ERCP) WITH PROPOFOL N/A 04/06/2019   Procedure: ENDOSCOPIC RETROGRADE CHOLANGIOPANCREATOGRAPHY (ERCP) WITH PROPOFOL;  Surgeon: Lucilla Lame, MD;  Location: [facility] ENDOSCOPY;  Service:  Endoscopy;  Laterality: N/A;   Family History Family History  Problem Relation Age of Onset   CAD Mother    Cancer Mother    Heart attack Father     Social History Social History   Tobacco Use   Smoking status: Former  Types: Cigarettes, Cigars    Quit date: 13    Years since quitting: 61.9   Smokeless tobacco: Current    Types: Chew  Vaping Use   Vaping Use: Never used  Substance Use Topics   Alcohol use: Not Currently   Drug use: No   Allergies Ativan [lorazepam], Penicillin g, Amlodipine, Lisinopril, and Spironolactone  Review of Systems Review of Systems  Unable to perform ROS: Mental status change    Physical Exam Vital Signs  I have reviewed the triage vital signs BP 124/68   Pulse 64   Temp 98.6 F (37 C) (Oral)   Resp 16   SpO2 99%   Physical Exam Constitutional:      General: He is not in acute distress.    Appearance: Normal appearance.  HENT:     Head: Normocephalic and atraumatic.     Nose: No congestion or rhinorrhea.  Eyes:     General:        Right eye: No discharge.        Left eye: No discharge.     Extraocular Movements: Extraocular movements intact.     Pupils: Pupils are equal, round, and reactive to light.  Cardiovascular:     Rate and Rhythm: Regular rhythm. Bradycardia present.     Heart sounds: No murmur heard. Pulmonary:     Effort: No respiratory distress.     Breath sounds: No wheezing or rales.  Abdominal:     General: There is no distension.     Tenderness: There is no abdominal tenderness.  Musculoskeletal:        General: Normal range of motion.     Cervical back: Normal range of motion.  Skin:    General: Skin is warm and dry.  Neurological:     General: No focal deficit present.     Mental Status: He is alert.     ED Results and Treatments Labs (all labs ordered are listed, but only abnormal results are displayed) Labs Reviewed  RESP PANEL BY RT-PCR (FLU A&B, COVID) ARPGX2  COMPREHENSIVE METABOLIC  PANEL  CBC WITH DIFFERENTIAL/PLATELET  URINALYSIS, ROUTINE W REFLEX MICROSCOPIC  RAPID URINE DRUG SCREEN, HOSP PERFORMED                                                                                                                          Radiology No results found.  Pertinent labs & imaging results that were available during my care of the patient were reviewed by me and considered in my medical decision making (see MDM for details).  Medications Ordered in ED Medications  lactated ringers bolus 1,000 mL (has no administration in time range)  Procedures Procedures  (including critical care time)  Medical Decision Making / ED Course   This patient presents to the ED for concern of altered mental status, this involves an extensive number of treatment options, and is a complaint that carries with it a high risk of complications and morbidity.  The differential diagnosis includes metabolic encephalopathy, toxic encephalopathy, UTI, electrolyte abnormality, intracranial bleed, failure to thrive, dementia,  MDM: Patient seen emerged part for evaluation of altered mental status and failure to thrive.  Physical exam reveals an ill-appearing somnolent patient who will awaken to noxious stimuli but is not participatory in exam.  Laboratory evaluation largely unremarkable with a hemoglobin of 10.2, creatinine 3.58, BUN 40 which is patient's baseline, urinalysis unremarkable.  UDS positive for opiates and this was highly concerning to the patient's daughter as he has no access to any opioid pain medication.  We did do a high-dose Narcan trial and patient had no change in mental status after administration of this medication and I do suspect a false positive on the UDS.  CT head with remote strokes and chest x-ray with a small pleural effusion and hiatal hernia.  COVID  and flu negative.  MRI brain ordered.  Patient will require hospital admission for delirium workup and patient admitted.   Additional history obtained: -Additional history obtained from daughter -External records from outside source obtained and reviewed including: Chart review including previous notes, labs, imaging, consultation notes   Lab Tests: -I ordered, reviewed, and interpreted labs.   The pertinent results include:   Labs Reviewed  RESP PANEL BY RT-PCR (FLU A&B, COVID) ARPGX2  COMPREHENSIVE METABOLIC PANEL  CBC WITH DIFFERENTIAL/PLATELET  URINALYSIS, ROUTINE W REFLEX MICROSCOPIC  RAPID URINE DRUG SCREEN, HOSP PERFORMED      EKG   EKG Interpretation  Date/Time:  Tuesday June 12 2022 19:01:30 EST Ventricular Rate:  64 PR Interval:  224 QRS Duration: 146 QT Interval:  456 QTC Calculation: 471 R Axis:   -5 Text Interpretation: Sinus rhythm Prolonged PR interval Right bundle branch block Confirmed by [place] ([id number]) on 06/12/2022 11:29:45 PM         Imaging Studies ordered: I ordered imaging studies including CT head, chest x-ray I independently visualized and interpreted imaging. I agree with the radiologist interpretation  MRI brain ordered and is pending   Medicines ordered and prescription drug management: Meds ordered this encounter  Medications   lactated ringers bolus 1,000 mL    -I have reviewed the patients home medicines and have made adjustments as needed  Critical interventions none  Cardiac Monitoring: The patient was maintained on a cardiac monitor.  I personally viewed and interpreted the cardiac monitored which showed an underlying rhythm of: Sinus bradycardia with PVCs  Social Determinants of Health:  Factors impacting patients care include: Lives at home with home health aide   Reevaluation: After the interventions noted above, I reevaluated the patient and found that they have :stayed the same  Co morbidities that  complicate the patient evaluation  Past Medical History:  Diagnosis Date   Anemia    Cardiac disease    CHF (congestive heart failure) (HCC)    Choledocholithiasis    Chronic kidney disease (CKD), stage IV (severe) (HCC)    left   Elevated LFTs    Esophageal stenosis    Hiatal hernia 04/03/2019   large   History of blindness    History of diarrhea    History of gallstones    Hypercholesteremia  Hypertension    Hypothyroidism    Malnutrition ([place])    Osteoporosis    Prostate cancer ([place])    Sepsis ([place])    Shingles    Shingles    Skin cancer    right cheek      Dispostion: I considered admission for this patient, and due to persistent altered mental status, patient require hospital mission for delirium workup     Final Clinical Impression(s) / ED Diagnoses Final diagnoses:  None     '@PCDICTATION'$ @    Teressa Lower, MD 06/13/22 1235

## 2022-06-12 NOTE — ED Triage Notes (Addendum)
Pt BIB EMS due to AMS. Pts family called out. LSN 1000. Pt has been having agitation, irritability, and non- verbal  that has gotten worse throughout day. Pts family states he is sundowing, no hx of dementia. Pt has hx of CHF.Pt is tracking eyes. Pt responds to painful stimuli. Pupils are restricted. BS 96.

## 2022-06-12 NOTE — ED Notes (Signed)
ED provider at bedside. At bedside with provider at this time to administer medication that was ordered. Pt didn't respond to first dose of Narcan. ED provider ordered second dose of Narcan. ED provider remained at bedside at this time. Second dose was administered and within a few min ED provider attempted sternum rub with pt and pt sat up swing his arms. Pt swung at this RN x 1. Pt then laid back into the bed and went back to sleep

## 2022-06-12 NOTE — ED Notes (Signed)
Received verbal report from [place] at this time

## 2022-06-12 NOTE — ED Notes (Signed)
Received call from lab pt CBC clotted and needs to be redrawn

## 2022-06-12 NOTE — ED Notes (Signed)
Pt transported to ct at this time 

## 2022-06-13 ENCOUNTER — Inpatient Hospital Stay (HOSPITAL_COMMUNITY): Payer: Medicare Other

## 2022-06-13 ENCOUNTER — Encounter (HOSPITAL_COMMUNITY): Payer: Self-pay | Admitting: Internal Medicine

## 2022-06-13 ENCOUNTER — Other Ambulatory Visit: Payer: Self-pay

## 2022-06-13 DIAGNOSIS — N184 Chronic kidney disease, stage 4 (severe): Secondary | ICD-10-CM | POA: Diagnosis present

## 2022-06-13 DIAGNOSIS — Z8673 Personal history of transient ischemic attack (TIA), and cerebral infarction without residual deficits: Secondary | ICD-10-CM | POA: Diagnosis not present

## 2022-06-13 DIAGNOSIS — I639 Cerebral infarction, unspecified: Secondary | ICD-10-CM | POA: Insufficient documentation

## 2022-06-13 DIAGNOSIS — G9341 Metabolic encephalopathy: Secondary | ICD-10-CM | POA: Insufficient documentation

## 2022-06-13 DIAGNOSIS — E039 Hypothyroidism, unspecified: Secondary | ICD-10-CM | POA: Diagnosis present

## 2022-06-13 DIAGNOSIS — I6381 Other cerebral infarction due to occlusion or stenosis of small artery: Secondary | ICD-10-CM | POA: Diagnosis present

## 2022-06-13 DIAGNOSIS — I6389 Other cerebral infarction: Secondary | ICD-10-CM | POA: Diagnosis not present

## 2022-06-13 DIAGNOSIS — Z85828 Personal history of other malignant neoplasm of skin: Secondary | ICD-10-CM | POA: Diagnosis not present

## 2022-06-13 DIAGNOSIS — E7800 Pure hypercholesterolemia, unspecified: Secondary | ICD-10-CM | POA: Diagnosis present

## 2022-06-13 DIAGNOSIS — D631 Anemia in chronic kidney disease: Secondary | ICD-10-CM

## 2022-06-13 DIAGNOSIS — M810 Age-related osteoporosis without current pathological fracture: Secondary | ICD-10-CM | POA: Diagnosis present

## 2022-06-13 DIAGNOSIS — Z6820 Body mass index (BMI) 20.0-20.9, adult: Secondary | ICD-10-CM | POA: Diagnosis not present

## 2022-06-13 DIAGNOSIS — F05 Delirium due to known physiological condition: Secondary | ICD-10-CM | POA: Diagnosis present

## 2022-06-13 DIAGNOSIS — Z8249 Family history of ischemic heart disease and other diseases of the circulatory system: Secondary | ICD-10-CM | POA: Diagnosis not present

## 2022-06-13 DIAGNOSIS — H547 Unspecified visual loss: Secondary | ICD-10-CM | POA: Diagnosis present

## 2022-06-13 DIAGNOSIS — R410 Disorientation, unspecified: Principal | ICD-10-CM | POA: Diagnosis present

## 2022-06-13 DIAGNOSIS — Z1152 Encounter for screening for COVID-19: Secondary | ICD-10-CM | POA: Diagnosis not present

## 2022-06-13 DIAGNOSIS — I5032 Chronic diastolic (congestive) heart failure: Secondary | ICD-10-CM | POA: Diagnosis present

## 2022-06-13 DIAGNOSIS — G928 Other toxic encephalopathy: Secondary | ICD-10-CM | POA: Diagnosis present

## 2022-06-13 DIAGNOSIS — R627 Adult failure to thrive: Secondary | ICD-10-CM | POA: Diagnosis present

## 2022-06-13 DIAGNOSIS — F1110 Opioid abuse, uncomplicated: Secondary | ICD-10-CM | POA: Diagnosis present

## 2022-06-13 DIAGNOSIS — Z8546 Personal history of malignant neoplasm of prostate: Secondary | ICD-10-CM | POA: Diagnosis not present

## 2022-06-13 DIAGNOSIS — N179 Acute kidney failure, unspecified: Secondary | ICD-10-CM | POA: Diagnosis present

## 2022-06-13 DIAGNOSIS — N189 Chronic kidney disease, unspecified: Secondary | ICD-10-CM | POA: Diagnosis not present

## 2022-06-13 DIAGNOSIS — Z8619 Personal history of other infectious and parasitic diseases: Secondary | ICD-10-CM | POA: Diagnosis not present

## 2022-06-13 DIAGNOSIS — E860 Dehydration: Secondary | ICD-10-CM | POA: Diagnosis present

## 2022-06-13 DIAGNOSIS — Z888 Allergy status to other drugs, medicaments and biological substances status: Secondary | ICD-10-CM | POA: Diagnosis not present

## 2022-06-13 DIAGNOSIS — Z87891 Personal history of nicotine dependence: Secondary | ICD-10-CM | POA: Diagnosis not present

## 2022-06-13 DIAGNOSIS — I130 Hypertensive heart and chronic kidney disease with heart failure and stage 1 through stage 4 chronic kidney disease, or unspecified chronic kidney disease: Secondary | ICD-10-CM | POA: Diagnosis present

## 2022-06-13 LAB — I-STAT ARTERIAL BLOOD GAS, ED
Acid-base deficit: 2 mmol/L (ref 0.0–2.0)
Bicarbonate: 22.5 mmol/L (ref 20.0–28.0)
Calcium, Ion: 1.23 mmol/L (ref 1.15–1.40)
HCT: 29 % — ABNORMAL LOW (ref 39.0–52.0)
Hemoglobin: 9.9 g/dL — ABNORMAL LOW (ref 13.0–17.0)
O2 Saturation: 92 %
Patient temperature: 98.6
Potassium: 3.6 mmol/L (ref 3.5–5.1)
Sodium: 137 mmol/L (ref 135–145)
TCO2: 24 mmol/L (ref 22–32)
pCO2 arterial: 35.3 mmHg (ref 32–48)
pH, Arterial: 7.413 (ref 7.35–7.45)
pO2, Arterial: 62 mmHg — ABNORMAL LOW (ref 83–108)

## 2022-06-13 LAB — LIPID PANEL
Cholesterol: 143 mg/dL (ref 0–200)
HDL: 58 mg/dL (ref >40)
LDL Cholesterol: 71 mg/dL (ref 0–99)
Total CHOL/HDL Ratio: 2.5 RATIO
Triglycerides: 71 mg/dL (ref <150)
VLDL: 14 mg/dL (ref 0–40)

## 2022-06-13 LAB — CBC
HCT: 30.6 % — ABNORMAL LOW (ref 39.0–52.0)
Hemoglobin: 10.2 g/dL — ABNORMAL LOW (ref 13.0–17.0)
MCH: 31.3 pg (ref 26.0–34.0)
MCHC: 33.3 g/dL (ref 30.0–36.0)
MCV: 93.9 fL (ref 80.0–100.0)
Platelets: 105 10*3/uL — ABNORMAL LOW (ref 150–400)
RBC: 3.26 MIL/uL — ABNORMAL LOW (ref 4.22–5.81)
RDW: 15 % (ref 11.5–15.5)
WBC: 4.7 10*3/uL (ref 4.0–10.5)
nRBC: 0 % (ref 0.0–0.2)

## 2022-06-13 LAB — ECHOCARDIOGRAM COMPLETE BUBBLE STUDY
Area-P 1/2: 3.27 cm2
Calc EF: 46.2 %
MV M vel: 5.93 m/s
MV Peak grad: 140.7 mmHg
Radius: 0.3 cm
S' Lateral: 2.8 cm
Single Plane A2C EF: 44.5 %
Single Plane A4C EF: 51.8 %

## 2022-06-13 LAB — LACTIC ACID, PLASMA
Lactic Acid, Venous: 1.9 mmol/L (ref 0.5–1.9)
Lactic Acid, Venous: 1.2 mmol/L (ref 0.5–1.9)

## 2022-06-13 LAB — AMMONIA: Ammonia: 21 umol/L (ref 9–35)

## 2022-06-13 LAB — CREATININE, SERUM
Creatinine, Ser: 3.17 mg/dL — ABNORMAL HIGH (ref 0.61–1.24)
GFR, Estimated: 18 mL/min — ABNORMAL LOW (ref >60)

## 2022-06-13 LAB — HEMOGLOBIN A1C
Hgb A1c MFr Bld: 5.2 % (ref 4.8–5.6)
Mean Plasma Glucose: 103 mg/dL

## 2022-06-13 LAB — TSH: TSH: 0.888 u[IU]/mL (ref 0.350–4.500)

## 2022-06-13 LAB — TROPONIN I (HIGH SENSITIVITY): Troponin I (High Sensitivity): 10 ng/L (ref <18)

## 2022-06-13 MED ORDER — ASPIRIN 300 MG RE SUPP
300.0000 mg | Freq: Once | RECTAL | Status: AC
  Administered 2022-06-13: 300 mg via RECTAL
  Filled 2022-06-13: qty 1

## 2022-06-13 MED ORDER — DOXAZOSIN MESYLATE 4 MG PO TABS
4.0000 mg | ORAL_TABLET | Freq: Every day | ORAL | Status: DC
  Administered 2022-06-14: 4 mg via ORAL
  Filled 2022-06-13 (×3): qty 1

## 2022-06-13 MED ORDER — HEPARIN SODIUM (PORCINE) 5000 UNIT/ML IJ SOLN
5000.0000 [IU] | Freq: Three times a day (TID) | INTRAMUSCULAR | Status: DC
  Administered 2022-06-13 – 2022-06-14 (×4): 5000 [IU] via SUBCUTANEOUS
  Filled 2022-06-13 (×4): qty 1

## 2022-06-13 MED ORDER — ASPIRIN 81 MG PO TBEC
81.0000 mg | DELAYED_RELEASE_TABLET | Freq: Every day | ORAL | Status: DC
  Administered 2022-06-13: 81 mg via ORAL
  Filled 2022-06-13: qty 1

## 2022-06-13 MED ORDER — LABETALOL HCL 5 MG/ML IV SOLN: 5.0000 mg | INTRAVENOUS | Status: DC | PRN

## 2022-06-13 MED ORDER — ACETAMINOPHEN 325 MG PO TABS
650.0000 mg | ORAL_TABLET | Freq: Four times a day (QID) | ORAL | Status: DC | PRN
  Administered 2022-06-13 – 2022-06-14 (×2): 650 mg via ORAL
  Filled 2022-06-13 (×2): qty 2

## 2022-06-13 MED ORDER — PROCHLORPERAZINE EDISYLATE 10 MG/2ML IJ SOLN: 5.0000 mg | Freq: Four times a day (QID) | INTRAMUSCULAR | Status: DC | PRN

## 2022-06-13 MED ORDER — LEVOTHYROXINE SODIUM 100 MCG PO TABS
100.0000 ug | ORAL_TABLET | Freq: Every day | ORAL | Status: DC
  Administered 2022-06-14: 100 ug via ORAL
  Filled 2022-06-13 (×2): qty 1

## 2022-06-13 MED ORDER — DEXTROSE IN LACTATED RINGERS 5 % IV SOLN: INTRAVENOUS | Status: DC

## 2022-06-13 MED ORDER — ATORVASTATIN CALCIUM 40 MG PO TABS
40.0000 mg | ORAL_TABLET | Freq: Every day | ORAL | Status: DC
  Administered 2022-06-13: 40 mg via ORAL
  Filled 2022-06-13: qty 1

## 2022-06-13 MED ORDER — CYANOCOBALAMIN 1000 MCG/ML IJ SOLN: 1000.0000 ug | INTRAMUSCULAR | Status: DC

## 2022-06-13 MED ORDER — POLYETHYLENE GLYCOL 3350 17 G PO PACK: 17.0000 g | PACK | Freq: Every day | ORAL | Status: DC | PRN

## 2022-06-13 NOTE — Progress Notes (Signed)
Carotid duplex bilateral study completed.   Please see CV Proc for preliminary results.   Miranda Garber, RDMS, RVT  

## 2022-06-13 NOTE — ED Notes (Signed)
Admitting MD at bedside. Patient speaking in clear sentences and complaining about wanting to be left allow.  Asked to follow commands and he stated "I can but I dont want too".  Patient agitated.

## 2022-06-13 NOTE — Progress Notes (Addendum)
STROKE TEAM PROGRESS NOTE   INTERVAL HISTORY No family is at the bedside. Patient uncooperative with exam, but alert and moves all extremities.  He states he wants to be left alone 12/5:Presented with lethargy and confusion. Workup with MRI Brain demonstrated a single punctate R posterior frontal lobe stroke unlikely to explain his presentation which is likely. felt to be multifactorial.  Patient currently in [facility] outpatient palliative care.   Vitals:   06/13/22 0800 06/13/22 0900 06/13/22 1000 06/13/22 1300  BP: (!) 104/57 (!) 125/53 129/63 (!) 141/61  Pulse: 60 (!) 55 60 64  Resp: '15 16 19 '$ (!) 24  Temp:      TempSrc:      SpO2: 91% 96% 97% 98%  Weight:      Height:       CBC:  Recent Labs  Lab 06/13/22 0059 06/13/22 0409  WBC 4.7  --   HGB 10.2* 9.9*  HCT 30.6* 29.0*  MCV 93.9  --   PLT 105*  --     Basic Metabolic Panel:  Recent Labs  Lab 06/12/22 2031 06/13/22 0059 06/13/22 0409  NA 137  --  137  K 3.8  --  3.6  CL 104  --   --   CO2 25  --   --   GLUCOSE 87  --   --   BUN 40*  --   --   CREATININE 3.58* 3.17*  --   CALCIUM 8.6*  --   --     Lipid Panel:  Recent Labs  Lab 06/13/22 0400  CHOL 143  TRIG 71  HDL 58  CHOLHDL 2.5  VLDL 14  LDLCALC 71    HgbA1c: No results for input(s): "HGBA1C" in the last 168 hours. Urine Drug Screen:  Recent Labs  Lab 06/12/22 2035  LABOPIA POSITIVE*  COCAINSCRNUR NONE DETECTED  LABBENZ NONE DETECTED  AMPHETMU NONE DETECTED  THCU NONE DETECTED  LABBARB NONE DETECTED     Alcohol Level No results for input(s): "ETH" in the last 168 hours.  IMAGING past 24 hours VAS US CAROTID  Result Date: 06/13/2022 Carotid Arterial Duplex Study Patient Name:  KIEN MIRSKY  Date of Exam:   06/13/2022 Medical Rec #: [id number]      Accession #:    [id number] Date of Birth: Jan 14, 1927     Patient Gender: M Patient Age:   88 years Exam Location:  [facility] Procedure:      VAS US CAROTID Referring Phys: Irene Pap  --------------------------------------------------------------------------------  Indications:       CVA. Risk Factors:      Hypertension, hyperlipidemia, past history of smoking,                    coronary artery disease. Limitations        Today's exam was limited due to Patient movement, respiratory                    variation. Comparison Study:  No prior studies. Performing Technologist: Darlin Coco RDMS, RVT  Examination Guidelines: A complete evaluation includes B-mode imaging, spectral Doppler, color Doppler, and power Doppler as needed of all accessible portions of each vessel. Bilateral testing is considered an integral part of a complete examination. Limited examinations for reoccurring indications may be performed as noted.  Right Carotid Findings: +----------+--------+--------+--------+------------------+--------+           PSV cm/sEDV cm/sStenosisPlaque DescriptionComments +----------+--------+--------+--------+------------------+--------+ CCA Prox  65  16                                         +----------+--------+--------+--------+------------------+--------+ CCA Distal57      16                                         +----------+--------+--------+--------+------------------+--------+ ICA Prox  107     12      1-39%   focal and calcific         +----------+--------+--------+--------+------------------+--------+ ICA Mid   72      15                                         +----------+--------+--------+--------+------------------+--------+ ICA Distal82      21                                         +----------+--------+--------+--------+------------------+--------+ ECA       95                                                 +----------+--------+--------+--------+------------------+--------+ +----------+--------+-------+----------------+-------------------+           PSV cm/sEDV cmsDescribe        Arm Pressure (mmHG)  +----------+--------+-------+----------------+-------------------+ [id number]             Multiphasic, WNL                    +----------+--------+-------+----------------+-------------------+ +---------+--------+--+--------+-+---------+ VertebralPSV cm/s52EDV cm/s8Antegrade +---------+--------+--+--------+-+---------+  Left Carotid Findings: +----------+--------+--------+--------+-------------------------+--------+           PSV cm/sEDV cm/sStenosisPlaque Description       Comments +----------+--------+--------+--------+-------------------------+--------+ CCA Prox  60      10                                                +----------+--------+--------+--------+-------------------------+--------+ CCA Distal56      10                                                +----------+--------+--------+--------+-------------------------+--------+ ICA Prox  46      10      1-39%   calcific and heterogenous         +----------+--------+--------+--------+-------------------------+--------+ ICA Mid   70      16                                                +----------+--------+--------+--------+-------------------------+--------+ ICA Distal75      11                                                +----------+--------+--------+--------+-------------------------+--------+  ECA       78                                                        +----------+--------+--------+--------+-------------------------+--------+ +----------+--------+--------+----------------+-------------------+           PSV cm/sEDV cm/sDescribe        Arm Pressure (mmHG) +----------+--------+--------+----------------+-------------------+ [id number]              Multiphasic, WNL                    +----------+--------+--------+----------------+-------------------+ +---------+--------+--+--------+-+---------+ VertebralPSV cm/s48EDV cm/s8Antegrade  +---------+--------+--+--------+-+---------+   Summary: Right Carotid: Velocities in the right ICA are consistent with a 1-39% stenosis. Left Carotid: Velocities in the left ICA are consistent with a 1-39% stenosis. Vertebrals:  Bilateral vertebral arteries demonstrate antegrade flow. Subclavians: Normal flow hemodynamics were seen in bilateral subclavian              arteries. *See table(s) above for measurements and observations.     Preliminary    EEG adult  Result Date: 06/13/2022 Kennon Portela, MD     06/13/2022 12:19 PM [facility] Routine EEG Report Demographics: Patient Name:   Logan, [place] Date of Birth:   08-17-1926 Identification Number:   MRN - [id number] Study Times: Study Start Time:   06/13/2022 10:29:23 Study End Time:   06/13/2022 10:55:13 Indication(s): Encephalopathy Technical Summary: This EEG was performed utilizing standard International 10-20 System of electrode placement. One channel electrocardiogram was monitored. Data were obtained, stored, and interpreted utilizing referential montage recording, with reformatting to longitudinal, transverse bipolar, and referential montages as necessary for interpretation. State(s):       Awake      Drowsy Activation Procedures: Photic Stimulation: EEG Description: Posterior dominant rhythm is 5-6 hz theta. There are no epileptiform discharges or focal slowing noted. No sleep architecture identified. Impression: This is an abnormal EEG due to the presence of a moderate encephalopathy of metabolic, toxic, infectious, or neurodegenerative etiology. There are no epileptiform discharges. Clinical correlation is advised. Dr Kennon Portela [facility] For Inpatient follow-up with [facility] physician please call [facility] [phone]. This is not an outpatient service. Post hospital discharge, please contact hospital directly.   MR ANGIO HEAD WO CONTRAST  Result Date: 06/13/2022 CLINICAL DATA:   88 year old male with altered mental status and punctate right superior frontal motor cortex infarct on MRI. EXAM: MRA HEAD WITHOUT CONTRAST TECHNIQUE: Angiographic images of the Circle of Willis were acquired using MRA technique without intravenous contrast. COMPARISON:  Brain MRI 0204 hours today. FINDINGS: Anterior circulation: Antegrade flow in both ICA siphons. Mildly dolichoectatic, tortuous bilateral siphons with no stenosis. Mildly tortuous carotid termini. Patent MCA and ACA origins without stenosis. Ophthalmic artery origins appear normal. Normal anterior communicating artery. Visible ACA branches are within normal limits. Left MCA M1 and bifurcation are patent without stenosis. Visible left MCA branches are within normal limits. Right MCA M1 segment is patent with mild irregularity and stenosis (series 1055, image 6). Patent right anterior temporal artery origin and right MCA bifurcation with no significant stenosis. Visible right MCA branches are within normal limits. Posterior circulation: Antegrade flow in the posterior circulation. Dominant distal left vertebral artery, the right is diminutive beyond the normal PICA origin. Left PICA origin is normal. Mild distal vertebral irregularity in keeping with atherosclerosis but  no significant stenosis. Patent basilar artery without stenosis. AICA, SCA and PCA origins are patent. Posterior communicating arteries are diminutive or absent. Left PCA occludes in the P 2 segment, superimposed on chronic left PCA territory infarct with encephalomalacia demonstrated on MRI today. There is moderate irregularity and stenosis of right PCA P3 branches. Anatomic variants: Dominant left vertebral artery, the right is diminutive beyond PICA. Other: Left greater than right PCA territory encephalomalacia as seen by MRI. No intracranial mass effect is evident. IMPRESSION: 1. No evidence of emergent large vessel occlusion. 2. 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 Moderate right PCA P3 branch atherosclerosis and stenosis. 3. Dolichoectatic ICAs and evidence of mild right MCA atherosclerosis. No significant anterior circulation stenosis. Electronically Signed   By: Genevie Ann M.D.   On: 06/13/2022 06:19   MR BRAIN WO CONTRAST  Result Date: 06/13/2022 CLINICAL DATA:  Initial evaluation for delirium, altered mental status. EXAM: MRI HEAD WITHOUT CONTRAST TECHNIQUE: Multiplanar, multiecho pulse sequences of the brain and surrounding structures were obtained without intravenous contrast. COMPARISON:  Prior CT from 06/12/2022. FINDINGS: Brain: Cerebral volume within normal limits for age. Generalized age-related cerebral atrophy. Patchy and confluent T2/FLAIR hyperintensity involving the periventricular and deep white matter both cerebral hemispheres, consistent with chronic microvascular ischemic disease, moderately advanced in nature. Chronic bilateral PCA territory infarcts a are seen, left larger than right. Few scattered small remote bilateral cerebellar infarcts present, also slightly worse on the left. Multiple remote lacunar infarcts noted about the deep gray nuclei and hemispheric cerebral white matter. There is a single punctate focus of restricted diffusion involving the posterior right frontal lobe/precentral gyrus (series 7, image 50). Associated signal loss on ADC map (series 8, image 15). Findings consistent with a tiny acute ischemic infarct. No associated hemorrhage or mass effect. No other convincing evidence for acute or subacute ischemia. No acute intracranial hemorrhage. Multiple chronic micro hemorrhages noted, most notably about the bilateral thalami, likely related to chronic poorly controlled hypertension. No mass lesion, midline shift or mass effect. Ventricular prominence related to global parenchymal volume loss without hydrocephalus. No extra-axial fluid collection. Pituitary gland suprasellar region within normal limits. Vascular: Major  intracranial vascular flow voids are maintained. Skull and upper cervical spine: Craniocervical junction within normal limits. Bone marrow signal intensity normal. No scalp soft tissue abnormality. Sinuses/Orbits: Prior bilateral ocular lens replacement. Paranasal sinuses are largely clear. No significant mastoid effusion. Other: None. IMPRESSION: 1. Single punctate acute ischemic nonhemorrhagic infarct involving the posterior right frontal lobe/precentral gyrus. 2. No other acute intracranial abnormality. 3. Age-related cerebral atrophy with moderate chronic microvascular ischemic disease, with multiple chronic ischemic infarcts as above. Electronically Signed   By: Jeannine Boga M.D.   On: 06/13/2022 02:29   CT HEAD WO CONTRAST (5MM)  Result Date: 06/12/2022 CLINICAL DATA:  Delirium. EXAM: CT HEAD WITHOUT CONTRAST TECHNIQUE: Contiguous axial images were obtained from the base of the skull through the vertex without intravenous contrast. RADIATION DOSE REDUCTION: This exam was performed according to the departmental dose-optimization program which includes automated exposure control, adjustment of the mA and/or kV according to patient size and/or use of iterative reconstruction technique. COMPARISON:  12/02/2010. FINDINGS: Brain: No acute intracranial hemorrhage, midline shift or mass effect. No extra-axial fluid collection. An old infarct is noted in the left cerebellar hemisphere. There is an old infarct involving the parieto-occipital region on the left. An old infarct is noted in the occipital lobe on the right. Diffuse atrophy is noted. Periventricular white matter  hypodensities are noted bilaterally. Vascular: Atherosclerotic calcification of the carotid siphons and vertebral arteries. No hyperdense vessel. Skull: Normal. Negative for fracture or focal lesion. Sinuses/Orbits: No acute finding. Other: None. IMPRESSION: 1. No acute intracranial process. 2. Atrophy with chronic microvascular ischemic  changes. 3. Multiple old infarcts bilaterally involving the occipital lobes bilaterally, parietal lobe on the left, and cerebellum on the left. Electronically Signed   By: Brett Fairy M.D.   On: 06/12/2022 21:27   DG Chest Portable 1 View  Result Date: 06/12/2022 CLINICAL DATA:  Pneumonia EXAM: PORTABLE CHEST 1 VIEW COMPARISON:  03/03/2022 FINDINGS: Retrocardiac opacification is again seen likely related to the large hiatal hernia better appreciated on prior examinations. Possible small left pleural effusion. Lungs are otherwise clear. No pneumothorax. No pleural effusion on the right. Cardiac size within normal limits. Pulmonary vascularity is normal. No acute bone abnormality. IMPRESSION: 1. Retrocardiac opacification likely related to known large hiatal hernia. 2. Possible small left pleural effusion. Electronically Signed   By: Fidela Salisbury M.D.   On: 06/12/2022 19:48    PHYSICAL EXAM Irritable elderly Caucasian male who is not cooperative for exam and wants to be left alone. Neurological exam he is awake alert and interactive.  He is irritable and will not cooperate for detailed exam.  Speech appears normal.  Extraocular movements appear full range.  Face is symmetric.  Able to move all 4 extremities well against gravity but will not cooperate for detailed muscle testing.  ASSESSMENT/PLAN [name] is a 88 y.o. male with history of HLD, prostrate cancer, hypothyroidism, prostrate cancer with mets, HTN, malnutrition and anorexia who presents with lethargy and confusion. 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 He is being admitted to the hospital for confusion and somnolence. Etiology felt to be multifactorial. He also has AKI on CKD 4. UDS positive for opiates, daughter in law denies patient ever being on opiates.  Patient not cooperative with exam. Alert and followed some commands. Would not answer orientation questions, asked [place] to "just  leave me alone and let me sleep".   Stroke-like symptoms lethargy and confusion with silent right frontal punctate lacunar infarct likely from small vessel disease Etiology:  multifactorial  CT head  1. No acute intracranial process. 2. Atrophy with chronic microvascular ischemic changes. 3. Multiple old infarcts bilaterally involving the occipital lobes bilaterally, parietal lobe on the left, and cerebellum on the left.  MRI  1. Single punctate acute ischemic nonhemorrhagic infarct involving the posterior right frontal lobe/precentral gyrus. 2. No other acute intracranial abnormality. 3. Age-related cerebral atrophy with moderate chronic microvascular ischemic disease, with multiple chronic ischemic infarcts as above.  Carotid Doppler bilateral 1-39% carotid stenosis. 2D Echo ejection fraction 50 to 55%.  LDL 71 HgbA1c No results found for requested labs within last 1095 days. VTE prophylaxis - heparin SQ    Diet   Diet clear liquid Room service appropriate? Yes; Fluid consistency: Thin   aspirin 81 mg daily prior to admission, now on aspirin 81 mg daily.  Therapy recommendations:  HHPT Disposition:  home. Previously on outpatient palliative.   Hypertension Home meds:  Toprol-XL, Imdur. HELD.  SBP goal permissive hypertension <220/110 for first 48h. Long-term BP goal normotensive  Hyperlipidemia Home meds:  lipitor, resumed in hospital LDL 71, goal < 70 Continue statin at discharge  ? Diabetes type II  Pending HgbA1c results  No AC/SSI ordered  Other Stroke Risk Factors Advanced Age >/= 35  Congestive heart failure   Hospital day # 0  E. Cherylann Banas, DNP, AGACNP-BC [facility] Pager: [phone]  I have personally obtained history,examined this patient, reviewed notes, independently viewed imaging studies, participated in medical decision making and plan of care.ROS completed by me personally and pertinent positives fully documented  I have made  any additions or clarifications directly to the above note. Agree with note above.  Patient presented with lethargy and confusion which is likely unrelated to the MRI finding of tiny punctate right frontal infarct which is probably from small vessel disease.  Patient is extremely uncooperative for exam and likely for further testing hence would not recommend more aggressive stroke work-up like prolonged cardiac monitoring.  Continue work-up for reversible causes of altered mental status.  Recommend aspirin for stroke prevention and aggressive risk factor modification.  No family available at the bedside.  Discussed with Dr. Sarajane Jews. Greater than 50% time during this 35-minute visit was spent on counseling and coordination of care about his silent stroke and discussion about evaluation and treatment and answering questions.  Stroke team will sign off.  And call for questions Antony Contras, MD Medical Director [place] Pager: [phone] 06/13/2022 4:29 PM  To contact Stroke Continuity provider, please refer to [email or web address] After hours, contact General Neurology

## 2022-06-13 NOTE — Progress Notes (Signed)
[facility]) Hospital Liaison note:  This patient is currently enrolled in [facility] outpatient-based Palliative Care. Will continue to follow for disposition.  Please call with any outpatient palliative questions or concerns.  Thank you, Lorelee Market, LPN [facility] Liaison [phone]

## 2022-06-13 NOTE — Consult Note (Signed)
NEUROLOGY CONSULTATION NOTE   Date of service: June 13, 2022 Patient Name: [name] MRN:  [id number] DOB:  09/06/1926 Reason for consult: "incidental punctate infarct" Requesting Provider: Kayleen Memos, DO _ _ _   _ __   _ __ _ _  __ __   _ __   __ _  History of Present Illness  [name] is a 88 y.o. male with PMH significant for HLD, prostrate cancer, hypothyroidism, prostrate cancer with mets, HTN, malnutrition and anorexia who presents with lethargy and confusion.  Went to doctors appointment on Monday and was fine in the AM. Not acting right in the afternoon/evening. On Tuesday, hard to arouse. Family eventually called EMS in the evening when he was still lethargic. For EMS, he would hold his arms up but not talk to them or do much else. No new medications, no recent illness, has been eating and drinking fine. Recent changes made to his thyroid medications.  Daughter in law reports that earlier, patient spoke to the RN and was able to talk to the RN when she came in to draw the blood. Daughter in law also reports that sometimes, when he is upset, he would just stop talking.  At baseline, patient uses a walker to slowly get around the house. He requires assistance with all ADLs and they have a care giver to help out with that.  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 He is being admitted to the hospital for confusion and somnolence. Etiology felt to be multifactorial. He also has AKI on CKD 4. UDS positive for opiates, daughter in law denies patient ever being on opiates.  Neurology consulted for further evaluation for stroke workup.  Patient on my eval, opens eyes, tracks across the room and makes eye contact. However, does not answer any questions or follow any commands.  LKW: 06/11/22 at 1630 mRS: 4 tNKASE: not offered, symptoms not felt  explained by the noted punctate infarct. Thrombectomy: no suspicion for LVO NIHSS  components Score: Comment  1a Level of Conscious 0'[x]'$  1'[]'$  2'[]'$  3'[]'$      1b LOC Questions 0'[]'$  1'[]'$  2'[x]'$       1c LOC Commands 0'[]'$  1'[]'$  2'[x]'$       2 Best Gaze 0'[x]'$  1'[]'$  2'[]'$       3 Visual 0'[x]'$  1'[]'$  2'[]'$  3'[]'$      4 Facial Palsy 0'[x]'$  1'[]'$  2'[]'$  3'[]'$      5a Motor Arm - left 0'[]'$  1'[]'$  2'[x]'$  3'[]'$  4'[]'$  UN'[]'$    5b Motor Arm - Right 0'[]'$  1'[]'$  2'[x]'$  3'[]'$  4'[]'$  UN'[]'$    6a Motor Leg - Left 0'[]'$  1'[]'$  2'[]'$  3'[x]'$  4'[]'$  UN'[]'$    6b Motor Leg - Right 0'[]'$  1'[]'$  2'[]'$  3'[x]'$  4'[]'$  UN'[]'$    7 Limb Ataxia 0'[x]'$  1'[]'$  2'[]'$  3'[]'$  UN'[]'$     8 Sensory 0'[x]'$  1'[]'$  2'[]'$  UN'[]'$      9 Best Language 0'[]'$  1'[]'$  2'[]'$  3'[x]'$      10 Dysarthria 0'[]'$  1'[]'$  2'[x]'$  UN'[]'$      11 Extinct. and Inattention 0'[x]'$  1'[]'$  2'[]'$       TOTAL: 17       ROS   Unable to obtain 2/2 patient not speaking.  Past History   Past Medical History:  Diagnosis Date   Anemia    Cardiac disease    CHF (congestive heart failure) (HCC)    Choledocholithiasis    Chronic kidney disease (CKD), stage IV (severe) (HCC)    left   Elevated LFTs    Esophageal stenosis  Hiatal hernia 04/03/2019   large   History of blindness    History of diarrhea    History of gallstones    Hypercholesteremia    Hypertension    Hypothyroidism    Malnutrition ([place])    Osteoporosis    Prostate cancer ([place])    Sepsis ([place])    Shingles    Shingles    Skin cancer    right cheek   Past Surgical History:  Procedure Laterality Date   CARDIAC CATHETERIZATION  04/13/2014   CT PERC CHOLECYSTOSTOMY  07/20/2016   Placed at [facility] Interventional Radiology   ENDOSCOPIC RETROGRADE CHOLANGIOPANCREATOGRAPHY (ERCP) WITH PROPOFOL N/A 04/06/2019   Procedure: ENDOSCOPIC RETROGRADE CHOLANGIOPANCREATOGRAPHY (ERCP) WITH PROPOFOL;  Surgeon: Lucilla Lame, MD;  Location: [facility] ENDOSCOPY;  Service: Endoscopy;  Laterality: N/A;   Family History  Problem Relation Age of Onset   CAD Mother    Cancer Mother    Heart attack Father    Social History   Socioeconomic History   Marital status: Married    Spouse name: Not on file   Number of children:  Not on file   Years of education: Not on file   Highest education level: Not on file  Occupational History   Not on file  Tobacco Use   Smoking status: Former    Types: Cigarettes, Cigars    Quit date: 61    Years since quitting: 61.9   Smokeless tobacco: Current    Types: Chew  Vaping Use   Vaping Use: Never used  Substance and Sexual Activity   Alcohol use: Not Currently   Drug use: No   Sexual activity: Not Currently  Other Topics Concern   Not on file  Social History Narrative   Tobacco use, amount per day now: Yes chews tobacco several times.   Past tobacco use, amount per day:   How many years did you use tobacco: 80+ years.   Alcohol use (drinks per week): Past beer drinker.   Diet:   Do you drink/eat things with caffeine: No   Marital status:  Married.                                What year were you married?   Do you live in a house, apartment, assisted living, condo, trailer, etc.? House.   Is it one or more stories? 1   How many persons live in your home? 3   Do you have pets in your home?( please list) No.   Highest Level of education completed? High School   Current or past profession: Teacher, adult education.   Do you exercise?  No.                                Type and how often?   Do you have a living will? No   Do you have a DNR form?    No                               If not, do you want to discuss one?   Do you have signed POA/HPOA forms?  No                   If so, please bring to you appointment      Do  you have any difficulty bathing or dressing yourself? Yes   Do you have any difficulty preparing food or eating? Yes   Do you have any difficulty managing your medications? Yes   Do you have any difficulty managing your finances? Yes   Do you have any difficulty affording your medications? No   Social Determinants of Radio broadcast assistant Strain: Not on file  Food Insecurity: Not on file  Transportation Needs: Not on file  Physical  Activity: Not on file  Stress: Not on file  Social Connections: Not on file   Allergies  Allergen Reactions   Ativan [Lorazepam] Other (See Comments)    Hallucinations, combative   Penicillin G Other (See Comments)    Has patient had a PCN reaction causing immediate rash, facial/tongue/throat swelling, SOB or lightheadedness with hypotension: Yes Has patient had a PCN reaction causing severe rash involving mucus membranes or skin necrosis: No Has patient had a PCN reaction that required hospitalization No Has patient had a PCN reaction occurring within the last 10 years: No If all of the above answers are "NO", then may proceed with Cephalosporin use.    Amlodipine Swelling   Lisinopril Other (See Comments)   Spironolactone Other (See Comments)   Tessalon [Benzonatate]     Medications  (Not in a hospital admission)    Vitals   Vitals:   06/13/22 0145 06/13/22 0300 06/13/22 0346 06/13/22 0400  BP: 111/65 120/64  (!) 136/59  Pulse: (!) 49 (!) 48  (!) 51  Resp: '17 16  19  '$ Temp:   98.7 F (37.1 C)   TempSrc:   Oral   SpO2: 98% 97%  95%  Weight:      Height:         Body mass index is 20.67 kg/m.  Physical Exam   General: Laying comfortably in bed; in no acute distress.  HENT: Normal oropharynx and mucosa. Normal external appearance of ears and nose.  Neck: Supple, no pain or tenderness  CV: No JVD. No peripheral edema.  Pulmonary: Symmetric Chest rise. Normal respiratory effort.  Abdomen: Soft to touch, non-tender.  Ext: No cyanosis, edema, or deformity  Skin: No rash. Normal palpation of skin.   Musculoskeletal: Normal digits and nails by inspection. No clubbing.   Neurologic Examination  Mental status/Cognition: opens eyes to voice, makes eye contact and tracks across the room. He is alert, but does not answer any orientation questions.  Speech/language: mute, makes eye contact, no speech.  Cranial nerves:   CN II Pupils equal and reactive to light, no VF  deficits    CN III,IV,VI EOM intact, no gaze preference or deviation, no nystagmus    CN V normal sensation in V1, V2, and V3 segments bilaterally    CN VII Symmetric facial grimace to noxious stimuli.   CN VIII normal hearing to speech    CN IX & X normal palatal elevation, no uvular deviation    CN XI Head midline   CN XII midline tongue protrusion    Motor:  Muscle bulk: normal, tone normal  BL upper extremities drift down when held up off the bed. BL lower ext fall to the bed immediately when held up.  Sensation:  Light touch    Pin prick Grimaces to noxious stimuli in all extremities.   Temperature    Vibration   Proprioception    Coordination/Complex Motor:  Unable to assess. Gait: deferred.  Labs   CBC:  Recent Labs  Lab  06/13/22 0059 06/13/22 0409  WBC 4.7  --   HGB 10.2* 9.9*  HCT 30.6* 29.0*  MCV 93.9  --   PLT 105*  --     Basic Metabolic Panel:  Lab Results  Component Value Date   NA 137 06/13/2022   K 3.6 06/13/2022   CO2 25 06/12/2022   GLUCOSE 87 06/12/2022   BUN 40 (H) 06/12/2022   CREATININE 3.17 (H) 06/13/2022   CALCIUM 8.6 (L) 06/12/2022   GFRNONAA 18 (L) 06/13/2022   GFRAA 24 (L) 03/25/2020   Lipid Panel:  Lab Results  Component Value Date   [facility]  12/02/2010    51        Total Cholesterol/HDL:CHD Risk Coronary Heart Disease Risk Table                     Men   Women  1/2 Average Risk   3.4   3.3  Average Risk       5.0   4.4  2 X Average Risk   9.6   7.1  3 X Average Risk  23.4   11.0        Use the calculated Patient Ratio above and the CHD Risk Table to determine the patient's CHD Risk.        ATP III CLASSIFICATION (LDL):  <100     mg/dL   Optimal  100-129  mg/dL   Near or Above                    Optimal  130-159  mg/dL   Borderline  160-189  mg/dL   High  >190     mg/dL   Very High   HgbA1c:  Lab Results  Component Value Date   HGBA1C 5.4 06/25/2016   Urine Drug Screen:     Component Value Date/Time    LABOPIA POSITIVE (A) 06/12/2022 2035   COCAINSCRNUR NONE DETECTED 06/12/2022 2035   LABBENZ NONE DETECTED 06/12/2022 2035   AMPHETMU NONE DETECTED 06/12/2022 2035   THCU NONE DETECTED 06/12/2022 2035   LABBARB NONE DETECTED 06/12/2022 2035    Alcohol Level No results found for: "ETH"  CT Head without contrast(Personally reviewed): CTH was negative for a large hypodensity concerning for a large territory infarct or hyperdensity concerning for an ICH  MR Angio head without contrast and Carotid Duplex BL(Personally reviewed): pending  MRI Brain(Personally reviewed): Small punctate R frontal cortical stroke  Impression   [name] is a 88 y.o. male with PMH significant for HLD, prostrate cancer, hypothyroidism, prostrate cancer with mets, HTN, malnutrition and anorexia who presents with lethargy and confusion. Seems to have started around Monday evening. Daughter in law reports that he does seem to be improving since he has been here. Suspect that noted lethargy is likely multifactorial from potential ?opiods, AKI. As for the noted stroke, suspect its likely incidental and unlikely to explain his lethargy or confusion.  Recommendations   - Frequent Neuro checks per stroke unit protocol - Recommend Vascular imaging with MRA Angio Head without contrast and US Carotid doppler - Recommend obtaining TTE - Recommend obtaining Lipid panel with LDL - Please start statin if LDL > 70 - Recommend HbA1c - Antithrombotic - Aspirin '81mg'$  daily. - Recommend DVT ppx - SBP goal - permissive hypertension first 24 h < 220/110. Held home meds.  - Recommend Telemetry monitoring for arrythmia - Recommend bedside swallow screen prior to PO intake. - Stroke education booklet -  Recommend PT/OT/SLP consult   ______________________________________________________________________   Thank you for the opportunity to take part in the care of this patient. If you have any further questions, please contact  the neurology consultation attending.  Signed,  [place] Pager Number [phone] _ _ _   _ __   _ __ _ _  __ __   _ __   __ _

## 2022-06-13 NOTE — Evaluation (Signed)
Clinical/Bedside Swallow Evaluation Patient Details  Name: [name] MRN: [id number] Date of Birth: [birth year]  Today's Date: 06/13/2022 Time: SLP Start Time (ACUTE ONLY): 9735 SLP Stop Time (ACUTE ONLY): 3299 SLP Time Calculation (min) (ACUTE ONLY): 15 min  Past Medical History:  Past Medical History:  Diagnosis Date   Anemia    Cardiac disease    CHF (congestive heart failure) (HCC)    Choledocholithiasis    Chronic kidney disease (CKD), stage IV (severe) (HCC)    left   Elevated LFTs    Esophageal stenosis    Hiatal hernia 04/03/2019   large   History of blindness    History of diarrhea    History of gallstones    Hypercholesteremia    Hypertension    Hypothyroidism    Malnutrition ([place])    Osteoporosis    Prostate cancer ([place])    Sepsis ([place])    Shingles    Shingles    Skin cancer    right cheek   Past Surgical History:  Past Surgical History:  Procedure Laterality Date   CARDIAC CATHETERIZATION  04/13/2014   CT PERC CHOLECYSTOSTOMY  07/20/2016   Placed at [facility] Interventional Radiology   ENDOSCOPIC RETROGRADE CHOLANGIOPANCREATOGRAPHY (ERCP) WITH PROPOFOL N/A 04/06/2019   Procedure: ENDOSCOPIC RETROGRADE CHOLANGIOPANCREATOGRAPHY (ERCP) WITH PROPOFOL;  Surgeon: Lucilla Lame, MD;  Location: [facility] ENDOSCOPY;  Service: Endoscopy;  Laterality: N/A;   HPI:  [name] is a 88 y.o. male  who presents with lethargy and confusion. 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   PMH significant for HLD, prostrate cancer, large hiatal hernia, hypothyroidism, prostrate cancer with mets, HTN, malnutrition and anorexia.  Receiving Palliative services PTA    Assessment / Plan / Recommendation  Clinical Impression  Patient's participation was limited but he did not present with clinical s/s of dysphagia as per this bedside swallow evaluation. His daugher in law was present and provided information regarding his swallow function. Patient was seen at [facility] in 2020 for a bedside  swallow evaluation and at that time, SLP recommended only esophageal/GERD precautions secondary to h/o large hiatal hernia. Per discussion during today's evaluation, patient takes medications whole and eats regular solids but family knows what he can tolerate. He and his wife both have 12 hours a day of hired care and family supplements the rest for 24 hours. Patient tolerated thin liquids, regular solids without significant difficulties and so SLP recommending regular diet, thin liquids and no f/u needed. He will need full supervision and assist with feeding secondary to visual impairment. SLP Visit Diagnosis: Dysphagia, oropharyngeal phase (R13.12)    Aspiration Risk  No limitations;Mild aspiration risk    Diet Recommendation Regular;Thin liquid   Liquid Administration via: Cup;Straw Medication Administration: Whole meds with liquid Supervision: Staff to assist with self feeding;Full supervision/cueing for compensatory strategies Compensations: Small sips/bites;Slow rate Postural Changes: Seated upright at 90 degrees;Remain upright for at least 30 minutes after po intake    Other  Recommendations Oral Care Recommendations: Oral care BID;Staff/trained caregiver to provide oral care    Recommendations for follow up therapy are one component of a multi-disciplinary discharge planning process, led by the attending physician.  Recommendations may be updated based on patient status, additional functional criteria and insurance authorization.  Follow up Recommendations No SLP follow up      Assistance Recommended at Discharge    Functional Status Assessment Patient has not had a recent decline in their functional status  Frequency and Duration  N/A       Prognosis   N/A     Swallow Study   General Date of Onset: 06/12/22 HPI: [name] is a 88 y.o. male  who presents with lethargy and confusion. 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   PMH significant for HLD, prostrate  cancer, large hiatal hernia, hypothyroidism, prostrate cancer with mets, HTN, malnutrition and anorexia.  Receiving Palliative services PTA Type of Study: Bedside Swallow Evaluation Previous Swallow Assessment: during 2020 admission, BSE, Diet Prior to this Study: Thin liquids Temperature Spikes Noted: No Respiratory Status: Room air History of Recent Intubation: No Behavior/Cognition: Alert;Uncooperative;Requires cueing Oral Cavity Assessment: Within Functional Limits Oral Care Completed by SLP: No Oral Cavity - Dentition: Dentures, bottom;Dentures, top Vision: Impaired for self-feeding Self-Feeding Abilities: Needs assist;Needs set up;Total assist Patient Positioning: Upright in bed Baseline Vocal Quality: Normal Volitional Cough: Cognitively unable to elicit Volitional Swallow: Unable to elicit    Oral/Motor/Sensory Function Overall Oral Motor/Sensory Function: Other (comment) (did not cooperate for oral motor exam)   Ice Chips     Thin Liquid Thin Liquid: Within functional limits Presentation: Straw    Nectar Thick     Honey Thick     Puree Puree: Not tested   Solid     Solid: Within functional limits     Sonia Baller, MA, CCC-SLP Speech Therapy

## 2022-06-13 NOTE — Procedures (Signed)
[facility] Consult Services  Routine EEG Report   Demographics: Patient Name:   Rollan, Roger  Date of Birth:   June 13, 1927  Identification Number:   MRN - [id number]  Study Times:  Study Start Time:   06/13/2022 10:29:23 Study End Time:   06/13/2022 10:55:13  Indication(s): Encephalopathy  Technical Summary:  This EEG was performed utilizing standard International 10-20 System of electrode placement. One channel electrocardiogram was monitored. Data were obtained, stored, and interpreted utilizing referential montage recording, with reformatting to longitudinal, transverse bipolar, and referential montages as necessary for interpretation.  State(s):       Awake      Drowsy   Activation Procedures: Photic Stimulation:   EEG Description:  Posterior dominant rhythm is 5-6 hz theta. There are no epileptiform discharges or focal slowing noted. No sleep architecture identified.  Impression:  This is an abnormal EEG due to the presence of a moderate encephalopathy of metabolic, toxic, infectious, or neurodegenerative etiology. There are no epileptiform discharges. Clinical correlation is advised.    Dr Kennon Portela      [facility] For Inpatient follow-up with [facility] physician please call [facility] [phone]. This is not an outpatient service. Post hospital discharge, please contact hospital directly.

## 2022-06-13 NOTE — ED Notes (Signed)
Patient being transported to echo.

## 2022-06-13 NOTE — Hospital Course (Addendum)
88 year old man PMH including prostate cancer presented with rapid change in mental status.  CT head no acute abnormalities but MRI did reveal small stroke.  Admitted for further evaluation.

## 2022-06-13 NOTE — Evaluation (Addendum)
Physical Therapy Evaluation Patient Details Name: [name] MRN: [id number] DOB: [birth year] Today's Date: 06/13/2022  History of Present Illness  [name] is a 88 y.o. male  who presents with lethargy and confusion. 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   PMH significant for HLD, prostrate cancer, hypothyroidism, prostrate cancer with mets, HTN, malnutrition and anorexia.  Receiving Palliative services PTA  Clinical Impression  Pt admitted with above diagnosis. Pt can move all 4 extremities however would not sit up with PT to edge of stretcher with pt actually resisting movement. Anticipate pt will be able to move around with assist and per chart, pt had assist with all ADLs PTA.  Once confirmed that pt has 24 hour care, pt should be able to return home with same care.   Pt currently with functional limitations due to the deficits listed below (see PT Problem List). Pt will benefit from skilled PT to increase their independence and safety with mobility to allow discharge to the venue listed below.          Recommendations for follow up therapy are one component of a multi-disciplinary discharge planning process, led by the attending physician.  Recommendations may be updated based on patient status, additional functional criteria and insurance authorization.  Follow Up Recommendations Home health PT (if plan is to continue Palliative care with caregivers then HHPT if able to receive services, must have 24 hour care)      Assistance Recommended at Discharge Frequent or constant Supervision/Assistance  Patient can return home with the following  A little help with walking and/or transfers;A little help with bathing/dressing/bathroom;Assistance with cooking/housework;Assist for transportation;Help with stairs or ramp for entrance;Direct supervision/assist for medications management;Direct supervision/assist for financial management    Equipment Recommendations Other (comment) (TBA)   Recommendations for Other Services       Functional Status Assessment Patient has had a recent decline in their functional status and demonstrates the ability to make significant improvements in function in a reasonable and predictable amount of time.     Precautions / Restrictions Precautions Precautions: Fall Restrictions Weight Bearing Restrictions: No      Mobility  Bed Mobility Overal bed mobility: Needs Assistance Bed Mobility: Rolling, Supine to Sit Rolling: Min guard   Supine to sit: Mod assist     General bed mobility comments: Pt rolls without assist however upon attempts to sit pt up, he resisted and would not sit to edge of stretcher even with max encouragement. Pt cursing at times.    Transfers                   General transfer comment: Pt declined    Ambulation/Gait                  Stairs            Wheelchair Mobility    Modified Rankin (Stroke Patients Only) Modified Rankin (Stroke Patients Only) Pre-Morbid Rankin Score: Moderately severe disability Modified Rankin: Moderately severe disability     Balance                                             Pertinent Vitals/Pain Pain Assessment Pain Assessment: No/denies pain    Home Living Family/patient expects to be discharged to:: Hospice/Palliative care  Home Equipment: Conservation officer, nature (2 wheels) Additional Comments: Pt unreliable historian however chart states that pt lives with family and has caregivers    Prior Function Prior Level of Function : Needs assist  Cognitive Assist : Mobility (cognitive);ADLs (cognitive)           Mobility Comments: Chart states assist with use of walker and caregiver ADLs Comments: chart states pt has assist with ADLS     Hand Dominance   Dominant Hand: Right    Extremity/Trunk Assessment   Upper Extremity Assessment Upper Extremity Assessment: Defer to OT evaluation    Lower  Extremity Assessment Lower Extremity Assessment: Generalized weakness       Communication   Communication: No difficulties  Cognition Arousal/Alertness: Awake/alert Behavior During Therapy: Impulsive, Restless Overall Cognitive Status: No family/caregiver present to determine baseline cognitive functioning                                          General Comments General comments (skin integrity, edema, etc.): 67 bpm, 92% RA, 132/59    Exercises General Exercises - Upper Extremity Shoulder Flexion: AROM, Both, 5 reps, Supine Elbow Flexion: AROM, Both, 5 reps, Supine General Exercises - Lower Extremity Ankle Circles/Pumps: AROM, Both, 5 reps, Supine Heel Slides: AROM, Both, 5 reps, Supine Straight Leg Raises: AROM, Both, 5 reps, Supine   Assessment/Plan    PT Assessment Patient needs continued PT services  PT Problem List Decreased activity tolerance;Decreased balance;Decreased mobility;Decreased knowledge of use of DME;Decreased safety awareness;Decreased knowledge of precautions       PT Treatment Interventions DME instruction;Gait training;Functional mobility training;Therapeutic activities;Therapeutic exercise;Balance training;Patient/family education    PT Goals (Current goals can be found in the Care Plan section)  Acute Rehab PT Goals Patient Stated Goal: unable to state PT Goal Formulation: Patient unable to participate in goal setting Time For Goal Achievement: 06/27/22 Potential to Achieve Goals: Fair    Frequency Min 4X/week     Co-evaluation               AM-PAC PT "6 Clicks" Mobility  Outcome Measure Help needed turning from your back to your side while in a flat bed without using bedrails?: A Little Help needed moving from lying on your back to sitting on the side of a flat bed without using bedrails?: A Lot Help needed moving to and from a bed to a chair (including a wheelchair)?: A Little Help needed standing up from a chair  using your arms (e.g., wheelchair or bedside chair)?: A Little Help needed to walk in hospital room?: A Lot Help needed climbing 3-5 steps with a railing? : A Lot 6 Click Score: 15    End of Session   Activity Tolerance: Patient limited by fatigue (self limiting) Patient left: with call bell/phone within reach (on stretcher) Nurse Communication: Mobility status PT Visit Diagnosis: Muscle weakness (generalized) (M62.81)    Time: 2094-7096 PT Time Calculation (min) (ACUTE ONLY): 11 min   Charges:   PT Evaluation $PT Eval Low Complexity: 1 Low          Milliani Herrada M,PT Acute Rehab Services [phone]   Alvira Philips 06/13/2022, 2:01 PM

## 2022-06-13 NOTE — Progress Notes (Signed)
SLP Cancellation Note  Patient Details Name: MOSIE ANGUS MRN: [id number] DOB: 09-27-1926   Cancelled treatment:       Reason Eval/Treat Not Completed: Fatigue/lethargy limiting ability to participate;Other (comment) (SLP will follow for readiness)  Sonia Baller, MA, CCC-SLP Speech Therapy

## 2022-06-13 NOTE — ED Notes (Signed)
Pt placed on 2L [place]

## 2022-06-13 NOTE — Progress Notes (Signed)
Echocardiogram 2D Echocardiogram has been performed.  [name] 06/13/2022, 1:57 PM

## 2022-06-13 NOTE — ED Notes (Signed)
Family advises that she believes pt has had a BM. This RN and NT at bedside. Pt didn't have a bm but pt was cleaned and changed at this time. New sheets were done with a draw sheet and diaper and condom cath at this time. While turning pt would open his eyes but no verbal response. While turning pt he was rigid and stiff to turn

## 2022-06-13 NOTE — H&P (Addendum)
History and Physical  [name] OAC:[id number] DOB: Sep 07, 1926 DOA: 06/12/2022  Referring physician: Dr. Matilde Sprang, [place]. PCP: Sandrea Hughs, NP  Outpatient Specialists:  Patient coming from: Home.  Chief Complaint: Altered mental status  HPI: [name] is a 88 y.o. male with medical history significant for prostate cancer diagnosed in 2006, previously on Lupron, history of C. difficile colitis, B12 deficiency, CKD 4, anemia of chronic disease, who presented to [facility] ED from home due to altered mental status and rapid decline for the past 24 hours.  Unclear of last known well.  Has had minimal oral intake and has been mostly somnolent.  EMS was activated and the patient was taken to the ER.  In the ED, the patient is lethargic but responding to painful stimuli.  CT head reveals no acute intracranial process.  Atrophy with chronic microvascular ischemic changes.  Multiple old infarcts bilaterally involving the occipital lobes bilaterally, parietal lobe on the left and cerebellum on the left.    UA was negative for pyuria.  Chest x-ray was nonacute.  His creatinine was worse than baseline.  Due to ongoing altered mental status, EDP requested admission.  The patient was admitted by the Hospitalist service.  ED Course: Tmax 98.7.  BP 120/64, pulse 48, respiration rate 18, O2 saturation 97% on room air.  Lab studies remarkable for hemoglobin 10.2, platelet 105.  BUN 40, creatinine 3.58, GFR 15.  Review of Systems: Review of systems as noted in the HPI. All other systems reviewed and are negative.   Past Medical History:  Diagnosis Date   Anemia    Cardiac disease    CHF (congestive heart failure) (HCC)    Choledocholithiasis    Chronic kidney disease (CKD), stage IV (severe) (HCC)    left   Elevated LFTs    Esophageal stenosis    Hiatal hernia 04/03/2019   large   History of blindness    History of diarrhea    History of gallstones    Hypercholesteremia    Hypertension     Hypothyroidism    Malnutrition (HCC)    Osteoporosis    Prostate cancer ([place])    Sepsis ([place])    Shingles    Shingles    Skin cancer    right cheek   Past Surgical History:  Procedure Laterality Date   CARDIAC CATHETERIZATION  04/13/2014   CT PERC CHOLECYSTOSTOMY  07/20/2016   Placed at [facility] Interventional Radiology   ENDOSCOPIC RETROGRADE CHOLANGIOPANCREATOGRAPHY (ERCP) WITH PROPOFOL N/A 04/06/2019   Procedure: ENDOSCOPIC RETROGRADE CHOLANGIOPANCREATOGRAPHY (ERCP) WITH PROPOFOL;  Surgeon: Lucilla Lame, MD;  Location: [facility] ENDOSCOPY;  Service: Endoscopy;  Laterality: N/A;    Social History:  reports that he quit smoking about 61 years ago. His smoking use included cigarettes and cigars. His smokeless tobacco use includes chew. He reports that he does not currently use alcohol. He reports that he does not use drugs.   Allergies  Allergen Reactions   Ativan [Lorazepam] Other (See Comments)    Hallucinations, combative   Penicillin G Other (See Comments)    Has patient had a PCN reaction causing immediate rash, facial/tongue/throat swelling, SOB or lightheadedness with hypotension: Yes Has patient had a PCN reaction causing severe rash involving mucus membranes or skin necrosis: No Has patient had a PCN reaction that required hospitalization No Has patient had a PCN reaction occurring within the last 10 years: No If all of the above answers are "NO", then may proceed with Cephalosporin use.  Amlodipine Swelling   Lisinopril Other (See Comments)   Spironolactone Other (See Comments)    Family History  Problem Relation Age of Onset   CAD Mother    Cancer Mother    Heart attack Father       Prior to Admission medications   Medication Sig Start Date End Date Taking? Authorizing Provider  aspirin EC 81 MG tablet Take 81 mg by mouth at bedtime.    [provider]  atorvastatin (LIPITOR) 40 MG tablet Take 40 mg by mouth at bedtime.     [provider]   benzonatate (TESSALON) 100 MG capsule Take 1 capsule (100 mg total) by mouth 3 (three) times daily as needed for cough. 03/03/22   Sharion Balloon, NP  Cholecalciferol (VITAMIN D3) 5000 units TABS Take 5,000 Units by mouth daily.    [provider]  cyanocobalamin (,VITAMIN B-12,) 1000 MCG/ML injection Inject 1,000 mcg into the muscle every 30 (thirty) days. 11/08/21   [provider]  doxazosin (CARDURA) 4 MG tablet Take 4 mg by mouth at bedtime.     [provider]  ferrous sulfate 325 (65 FE) MG tablet Take 325 mg by mouth 2 (two) times daily.     [provider]  isosorbide mononitrate (IMDUR) 30 MG 24 hr tablet Take 1 tablet (30 mg total) by mouth daily. 03/11/18   [place]om[place], Carley D, DO  levothyroxine (SYNTHROID, LEVOTHROID) 88 MCG tablet Take 88 mcg by mouth daily. 02/11/18   [provider]  loperamide (IMODIUM) 2 MG capsule Take 2 mg by mouth as needed for diarrhea or loose stools.    [provider]  metoprolol succinate (TOPROL-XL) 50 MG 24 hr tablet Take 50 mg by mouth daily. Take with or immediately following a meal.    [provider]  Omega-3 Fatty Acids (FISH OIL) 1200 MG CAPS Take 1,200 mg by mouth 2 (two) times daily.    [provider]  pantoprazole (PROTONIX) 40 MG tablet Take 40 mg by mouth daily.    [provider]  potassium chloride SA (K-DUR,KLOR-CON) 20 MEQ tablet Take 20 mEq by mouth 2 (two) times daily as needed.  08/18/15   [provider]  sodium bicarbonate 650 MG tablet Take 1,300 mg by mouth 2 (two) times daily.     [provider]  tamsulosin (FLOMAX) 0.4 MG CAPS capsule Take 0.4 mg by mouth at bedtime.     [provider]  torsemide (DEMADEX) 20 MG tablet Take 40 mg by mouth in the morning and at bedtime.  10/27/15   [provider]    Physical Exam: BP 118/65   Pulse (!) 50   Temp 98.6 F (37 C) (Oral)   Resp (!) 21   SpO2 95%   General: 88 y.o.  year-old male well developed well nourished in no acute distress.  Alert and lethargic. Cardiovascular: Regular rate and rhythm with no rubs or gallops.  No thyromegaly or JVD noted.  No lower extremity edema. 2/4 pulses in all 4 extremities. Respiratory: Clear to auscultation with no wheezes or rales.  Poor inspiratory effort. Abdomen: Soft nontender nondistended with normal bowel sounds x4 quadrants. Muskuloskeletal: No cyanosis, clubbing or edema noted bilaterally Neuro: CN II-XII intact, strength, sensation, reflexes Skin: No ulcerative lesions noted or rashes Psychiatry: Unable to assess judgment or mood due to somnolence.         Labs on Admission:  Basic Metabolic Panel: Recent Labs  Lab 06/12/22 2031  NA 137  K 3.8  CL 104  CO2 25  GLUCOSE 87  BUN 40*  CREATININE 3.58*  CALCIUM 8.6*   Liver Function Tests: Recent Labs  Lab 06/12/22 2031  AST 15  ALT 11  ALKPHOS 59  BILITOT 0.8  PROT 5.0*  ALBUMIN 2.7*   No results for input(s): "LIPASE", "AMYLASE" in the last 168 hours. No results for input(s): "AMMONIA" in the last 168 hours. CBC: No results for input(s): "WBC", "NEUTROABS", "HGB", "HCT", "MCV", "PLT" in the last 168 hours. Cardiac Enzymes: No results for input(s): "CKTOTAL", "CKMB", "CKMBINDEX", "TROPONINI" in the last 168 hours.  BNP (last 3 results) No results for input(s): "BNP" in the last 8760 hours.  ProBNP (last 3 results) No results for input(s): "PROBNP" in the last 8760 hours.  CBG: No results for input(s): "GLUCAP" in the last 168 hours.  Radiological Exams on Admission: CT HEAD WO CONTRAST (5MM)  Result Date: 06/12/2022 CLINICAL DATA:  Delirium. EXAM: CT HEAD WITHOUT CONTRAST TECHNIQUE: Contiguous axial images were obtained from the base of the skull through the vertex without intravenous contrast. RADIATION DOSE REDUCTION: This exam was performed according to the departmental dose-optimization program which includes automated exposure  control, adjustment of the mA and/or kV according to patient size and/or use of iterative reconstruction technique. COMPARISON:  12/02/2010. FINDINGS: Brain: No acute intracranial hemorrhage, midline shift or mass effect. No extra-axial fluid collection. An old infarct is noted in the left cerebellar hemisphere. There is an old infarct involving the parieto-occipital region on the left. An old infarct is noted in the occipital lobe on the right. Diffuse atrophy is noted. Periventricular white matter hypodensities are noted bilaterally. Vascular: Atherosclerotic calcification of the carotid siphons and vertebral arteries. No hyperdense vessel. Skull: Normal. Negative for fracture or focal lesion. Sinuses/Orbits: No acute finding. Other: None. IMPRESSION: 1. No acute intracranial process. 2. Atrophy with chronic microvascular ischemic changes. 3. Multiple old infarcts bilaterally involving the occipital lobes bilaterally, parietal lobe on the left, and cerebellum on the left. Electronically Signed   By: Brett Fairy M.D.   On: 06/12/2022 21:27   DG Chest Portable 1 View  Result Date: 06/12/2022 CLINICAL DATA:  Pneumonia EXAM: PORTABLE CHEST 1 VIEW COMPARISON:  03/03/2022 FINDINGS: Retrocardiac opacification is again seen likely related to the large hiatal hernia better appreciated on prior examinations. Possible small left pleural effusion. Lungs are otherwise clear. No pneumothorax. No pleural effusion on the right. Cardiac size within normal limits. Pulmonary vascularity is normal. No acute bone abnormality. IMPRESSION: 1. Retrocardiac opacification likely related to known large hiatal hernia. 2. Possible small left pleural effusion. Electronically Signed   By: Fidela Salisbury M.D.   On: 06/12/2022 19:48    EKG: I independently viewed the EKG done and my findings are as followed: Sinus rhythm rate of 64.  Nonspecific ST-T changes.  QTc 471.  Assessment/Plan Present on Admission:  Delirium  Principal  Problem:   Delirium  Delirium/acute metabolic encephalopathy, likely multifactorial Treat underlying conditions Dehydration Old strokes, seen on CT scan Follow MRI brain, it revealed single punctate acute ischemic nonhemorrhagic infarct involving the posterior right frontal lobe/precentral gyrus.  No other acute intracranial abnormality.  Age related cerebral atrophy with moderate chronic microvascular ischemic disease, with multiple chronic ischemic infarcts as above. Aspiration/delirium/fall precautions. N.p.o. until passes swallow evaluation Ammonia, TSH, lactic acid, troponin, EEG are pending.  Acute CVA, seen on MRI brain Radiology description as stated above Rectal aspirin 300 mg x 1 Neurology consulted Complete 2D  echo with bubble study Bilateral carotid Doppler ultrasound Defer choice of imaging of head vasculature to neurology Defer antiplatelets to neurology. Permissive hypertension, treat SBP greater than 220 or DBP greater than 120. Fasting lipid panel, hemoglobin A1c PT/OT/speech therapist assessment  AKI on CKD 4 Creatinine above baseline. Avoid nephrotoxic agents, dehydration and hypotension. Start gentle IV fluid hydration Monitor urine output with strict I's and O's Repeat renal function test in the morning  History of C. Difficile Per his daughter-in-law at bedside he no longer has diarrhea Monitor closely   Critical care time, 65 minutes.    DVT prophylaxis: Subcu heparin 3 times daily  Code Status: Full code  Family Communication: Daughter-in-law at bedside  Disposition Plan: Admitted to progressive care unit  Consults called: Neurology  Admission status: Inpatient status.   Status is: Inpatient The patient requires at least 2 midnights for further evaluation and treatment of present condition.   Kayleen Memos MD [facility] Pager [phone]  If 7PM-7AM, please contact night-coverage [email or web address] Password [email or web address]  06/13/2022,  12:06 AM

## 2022-06-13 NOTE — Progress Notes (Signed)
  Progress Note   Patient: [name] DOB: 1926/10/23 DOA: 06/12/2022     0 DOS: the patient was seen and examined on 06/13/2022   Brief hospital course: 88 year old man PMH including prostate cancer presented with rapid change in mental status.  CT head no acute abnormalities but MRI did reveal small stroke.  Admitted for further evaluation.  Assessment and Plan: Delirium/acute metabolic encephalopathy, multifactorial Thought to be potentially from opioids, AKI, stroke not thought to be contributory.  Ammonia, TSH, lactic acid, troponin unrevealing.  EEG abnormal but no seizure activity seen. Appears to be clinically improved today, interactive.   Acute CVA, seen on MRI brain Continue management as per neurology.     AKI versus progression of CKD 4 Creatinine last checked 2 years ago which time was 2.58.  Cannot rule out AKI at this point. Renal function improved today compared to yesterday.  Anemia of CKD Appears to be stable.   Currently [facility] in [facility] outpatient[facility]based Pall[facility] Care.  Will discuss with family.     Subjective:  Feels bad, but no specifics. Breathing seems to be ok. No nausea. History limited and pt confused, so hx unreliable  Physical Exam: Vitals:   06/13/22 0800 06/13/22 0900 06/13/22 1000 06/13/22 1300  BP: (!) 104/57 (!) 125/53 129/63 (!) 141/61  Pulse: 60 (!) 55 60 64  Resp: '15 16 19 '$ (!) 24  Temp:      TempSrc:      SpO2: 91% 96% 97% 98%  Weight:      Height:       Physical Exam Vitals reviewed.  Constitutional:      General: He is not in acute distress.    Appearance: He is ill-appearing. He is not toxic-appearing.  Cardiovascular:     Rate and Rhythm: Normal rate and regular rhythm.     Heart sounds: No murmur heard. Pulmonary:     Effort: Pulmonary effort is normal. No respiratory distress.     Breath sounds: No wheezing, rhonchi or rales.  Abdominal:     General: There is no distension.     Palpations: Abdomen is  soft.  Musculoskeletal:     Right lower leg: No edema.     Left lower leg: No edema.     Comments: Tone BUE BLE grossly normal  Skin:    Findings: No erythema.  Neurological:     General: No focal deficit present.     Mental Status: He is alert.  Psychiatric:        Attention and Perception: Attention normal.        Mood and Affect: Affect is flat.        Speech: Speech normal.        Behavior: Behavior is uncooperative.        Cognition and Memory: Memory is impaired.     Comments: Disgruntled, mildly uncooperative. Confused.      Data Reviewed: Creatinine 3.17, better, baseline around 3-3.5  Family Communication: none at bedside  Disposition: Status is: Inpatient Remains inpatient appropriate because: acute metabolic encephalopathy  Planned Discharge Destination:  TBD    Time spent: 30 minutes  Author: Murray Hodgkins, MD 06/13/2022 2:10 PM  For on call review [email or web address].

## 2022-06-13 NOTE — Progress Notes (Signed)
EEG complete - results pending 

## 2022-06-14 DIAGNOSIS — I639 Cerebral infarction, unspecified: Secondary | ICD-10-CM | POA: Diagnosis not present

## 2022-06-14 DIAGNOSIS — N189 Chronic kidney disease, unspecified: Secondary | ICD-10-CM | POA: Diagnosis not present

## 2022-06-14 DIAGNOSIS — G9341 Metabolic encephalopathy: Secondary | ICD-10-CM | POA: Diagnosis not present

## 2022-06-14 DIAGNOSIS — R410 Disorientation, unspecified: Secondary | ICD-10-CM | POA: Diagnosis not present

## 2022-06-14 LAB — BASIC METABOLIC PANEL
Anion gap: 8 (ref 5–15)
BUN: 31 mg/dL — ABNORMAL HIGH (ref 8–23)
CO2: 22 mmol/L (ref 22–32)
Calcium: 8.2 mg/dL — ABNORMAL LOW (ref 8.9–10.3)
Chloride: 107 mmol/L (ref 98–111)
Creatinine, Ser: 3 mg/dL — ABNORMAL HIGH (ref 0.61–1.24)
GFR, Estimated: 19 mL/min — ABNORMAL LOW (ref >60)
Glucose, Bld: 161 mg/dL — ABNORMAL HIGH (ref 70–99)
Potassium: 3.5 mmol/L (ref 3.5–5.1)
Sodium: 137 mmol/L (ref 135–145)

## 2022-06-14 MED ORDER — ADULT MULTIVITAMIN W/MINERALS CH
1.0000 | ORAL_TABLET | Freq: Every day | ORAL | Status: DC
  Administered 2022-06-14: 1 via ORAL
  Filled 2022-06-14: qty 1

## 2022-06-14 MED ORDER — ENSURE ENLIVE PO LIQD
237.0000 mL | Freq: Two times a day (BID) | ORAL | Status: DC
  Administered 2022-06-14: 237 mL via ORAL

## 2022-06-14 NOTE — Discharge Summary (Signed)
Physician Discharge Summary   Patient: [name] MRN: [id number] DOB: 1926-08-11  Admit date:     06/12/2022  Discharge date: 06/14/22  Discharge Physician: Murray Hodgkins   PCP: Sandrea Hughs, NP   Recommendations at discharge:    Acute CVA, secondary to small vessel disease Follow-up with neurology as an outpatient.   AKI versus progression of CKD 4 Creatinine last checked 2 years ago which time was 2.58. Cannot rule out AKI at this point. Recommend outpatient follow-up   Discharge Diagnoses: Principal Problem:   Delirium Active Problems:   CKD (chronic kidney disease), stage IV (HCC)   Anemia of chronic kidney failure, unspecified stage   Acute metabolic encephalopathy   Stroke [facility])  Resolved Problems:   * No resolved hospital problems. *  Hospital Course: 88 year old man PMH including prostate cancer presented with rapid change in mental status.  CT head no acute abnormalities but MRI did reveal small stroke.  Admitted for further evaluation.  Workup was negative and inciting factor unclear.  Seen by neurology.  Stroke thought to be noncontributory.  May be related to AKI although this diagnosis is not clear.  Given subsequent improvement, patient discharged home in good condition.  Assessment and Plan: Delirium/acute metabolic encephalopathy, multifactorial Thought to be potentially from opioids per neurology, however family denies the patient takes this medication. AKI is a possibility but baseline creatinine with certainty. Stroke not thought to be contributory.  Ammonia, TSH, lactic acid, troponin unrevealing.  EEG abnormal but no seizure activity seen. Appears likely back at baseline.Marland Kitchen  Discharge home today.   Acute CVA, secondary to small vessel disease Continue management as per neurology... ASA '81mg'$  daily, statin.  Follow-up with neurology as an outpatient.   AKI versus progression of CKD 4 Creatinine last checked 2 years ago which time was 2.58. Cannot  rule out AKI at this point. Renal function improved.   Anemia of CKD Appears to be stable.   Currently enrolled in [facility] outpatient-based Palliative Care.       Consultants:  Neurology   Procedures performed:  None   Disposition: Home health Diet recommendation:  Regular diet DISCHARGE MEDICATION: Allergies as of 06/14/2022       Reactions   Ativan [lorazepam] Other (See Comments)   Hallucinations, combative   Penicillin G Other (See Comments)   Has patient had a PCN reaction causing immediate rash, facial/tongue/throat swelling, SOB or lightheadedness with hypotension: Yes Has patient had a PCN reaction causing severe rash involving mucus membranes or skin necrosis: No Has patient had a PCN reaction that required hospitalization No Has patient had a PCN reaction occurring within the last 10 years: No If all of the above answers are "NO", then may proceed with Cephalosporin use.   Amlodipine Swelling   Lisinopril Other (See Comments)   Spironolactone Other (See Comments)   Tessalon [benzonatate]         Medication List     STOP taking these medications    benzonatate 100 MG capsule Commonly known as: TESSALON   doxazosin 4 MG tablet Commonly known as: CARDURA   loperamide 2 MG capsule Commonly known as: IMODIUM       TAKE these medications    aspirin EC 81 MG tablet Take 81 mg by mouth at bedtime.   atorvastatin 40 MG tablet Commonly known as: LIPITOR Take 40 mg by mouth at bedtime.   [employer] Dig[employer]ive Probiotic 250 MG capsule Generic drug: saccharomyces boulardii Take 250 mg by mouth 2 (two)  times daily.   cyanocobalamin 1000 MCG/ML injection Commonly known as: VITAMIN B12 Inject 1,000 mcg into the muscle every 30 (thirty) days. Every 15th of the month.   ferrous sulfate 325 (65 FE) MG tablet Take 325 mg by mouth 2 (two) times daily.   Fish Oil 1200 MG Caps Take 1,200 mg by mouth 2 (two) times daily.   isosorbide mononitrate 30 MG 24 hr  tablet Commonly known as: IMDUR Take 1 tablet (30 mg total) by mouth daily.   levothyroxine 100 MCG tablet Commonly known as: SYNTHROID Take 100 mcg by mouth daily before breakfast. What changed: Another medication with the same name was removed. Continue taking this medication, and follow the directions you see here.   metoprolol succinate 50 MG 24 hr tablet Commonly known as: TOPROL-XL Take 50 mg by mouth daily. Take with or immediately following a meal.   nitroGLYCERIN 0.4 MG SL tablet Commonly known as: NITROSTAT Place 0.4 mg under the tongue every 5 (five) minutes as needed for chest pain.   pantoprazole 40 MG tablet Commonly known as: PROTONIX Take 40 mg by mouth daily.   potassium chloride SA 20 MEQ tablet Commonly known as: KLOR-CON M Take 20 mEq by mouth 2 (two) times daily as needed.   sodium bicarbonate 650 MG tablet Take 1,300 mg by mouth 2 (two) times daily.   tamsulosin 0.4 MG Caps capsule Commonly known as: FLOMAX Take 0.4 mg by mouth at bedtime.   torsemide 20 MG tablet Commonly known as: DEMADEX Take 40 mg by mouth in the morning and at bedtime.   Vitamin D3 125 MCG (5000 UT) Tabs Take 5,000 Units by mouth daily.        Follow-up Information     [facility] Follow up.   Specialty: Home Health Services Why: The home health agency will contact you for the first home visit. Contact information: [address] [place] [address] [phone]         Ngetich, Dinah C, NP Follow up in 1 day(s).   Specialty: Family Medicine Contact information: [place] [address] [phone]         Dionisio David, MD. Schedule an appointment as soon as possible for a visit in 2 week(s).   Specialty: Cardiology Contact information: [place] [address] [phone]                Charco ok  Discharge Exam: Filed Weights   06/13/22 0133 06/13/22 2302  Weight: 59.9 kg 60.2 kg   Physical Exam Vitals reviewed.  Constitutional:      General: He is not in acute distress.    Appearance: He is not ill-appearing or toxic-appearing.  Cardiovascular:     Rate and Rhythm: Normal rate and regular rhythm.     Heart sounds: No murmur heard. Pulmonary:     Effort: Pulmonary effort is normal. No respiratory distress.     Breath sounds: No wheezing, rhonchi or rales.  Neurological:     Mental Status: He is alert.  Psychiatric:        Mood and Affect: Mood normal.        Behavior: Behavior normal.      Condition at discharge: good  The results of significant diagnostics from this hospitalization (including imaging, microbiology, ancillary and laboratory) are listed below for reference.   Imaging Studies: ECHOCARDIOGRAM COMPLETE BUBBLE STUDY  Result Date: 06/13/2022    ECHOCARDIOGRAM REPORT   Patient  Name:   [name] Date of Exam: 06/13/2022 Medical Rec #:  [id number]     Height:       67.0 in Accession #:    [id number]    Weight:       132.0 lb Date of Birth:  06/25/1927    BSA:          1.695 m Patient Age:    10 years      BP:           132/59 mmHg Patient Gender: M             HR:           65 bpm. Exam Location:  Inpatient Procedure: 2D Echo, Cardiac Doppler, Color Doppler and Saline Contrast Bubble            Study Indications:    Stroke 434.91 / I63.9  History:        Patient has prior history of Echocardiogram examinations, most                 recent 03/12/2018. CHF; Risk Factors:Hypertension.  Sonographer:    Bernadene Person RDCS Referring Phys: [id number] [place]  1. Left ventricular ejection fraction, by estimation, is 50 to 55%. The left ventricle has low normal function. The left ventricle has no regional wall motion abnormalities. Left ventricular diastolic parameters are consistent with Grade I diastolic dysfunction (impaired relaxation).  2. Right ventricular systolic function is normal. The right ventricular size is normal. Tricuspid  regurgitation signal is inadequate for assessing PA pressure.  3. Left atrial size was mildly dilated.  4. The mitral valve is normal in structure. Mild to moderate mitral valve regurgitation. No evidence of mitral stenosis.  5. The aortic valve is tricuspid. There is mild calcification of the aortic valve. Aortic valve regurgitation is not visualized. Aortic valve sclerosis/calcification is present, without any evidence of aortic stenosis.  6. Aortic dilatation noted. There is mild dilatation of the ascending aorta, measuring 39 mm.  7. The inferior vena cava is normal in size with greater than 50% respiratory variability, suggesting right atrial pressure of 3 mmHg.  8. Negative bubble study, no evidence for ASD or PFO. FINDINGS  Left Ventricle: Left ventricular ejection fraction, by estimation, is 50 to 55%. The left ventricle has low normal function. The left ventricle has no regional wall motion abnormalities. The left ventricular internal cavity size was normal in size. There is no left ventricular hypertrophy. Left ventricular diastolic parameters are consistent with Grade I diastolic dysfunction (impaired relaxation). Right Ventricle: The right ventricular size is normal. No increase in right ventricular wall thickness. Right ventricular systolic function is normal. Tricuspid regurgitation signal is inadequate for assessing PA pressure. Left Atrium: Left atrial size was mildly dilated. Right Atrium: Right atrial size was normal in size. Pericardium: There is no evidence of pericardial effusion. Mitral Valve: The mitral valve is normal in structure. Mild to moderate mitral valve regurgitation. No evidence of mitral valve stenosis. Tricuspid Valve: The tricuspid valve is normal in structure. Tricuspid valve regurgitation is not demonstrated. Aortic Valve: The aortic valve is tricuspid. There is mild calcification of the aortic valve. Aortic valve regurgitation is not visualized. Aortic valve  sclerosis/calcification is present, without any evidence of aortic stenosis. Pulmonic Valve: The pulmonic valve was normal in structure. Pulmonic valve regurgitation is not visualized. Aorta: The aortic root is normal in size and structure and aortic dilatation noted. There is mild dilatation of the  ascending aorta, measuring 39 mm. Venous: The inferior vena cava is normal in size with greater than 50% respiratory variability, suggesting right atrial pressure of 3 mmHg. IAS/Shunts: Negative bubble study, no evidence for ASD or PFO. Agitated saline contrast was given intravenously to evaluate for intracardiac shunting.  LEFT VENTRICLE PLAX 2D LVIDd:         3.70 cm     Diastology LVIDs:         2.80 cm     LV e' medial:    5.73 cm/s LV PW:         0.80 cm     LV E/e' medial:  11.7 LV IVS:        0.70 cm     LV e' lateral:   7.42 cm/s LVOT diam:     2.20 cm     LV E/e' lateral: 9.0 LV SV:         65 LV SV Index:   38 LVOT Area:     3.80 cm  LV Volumes (MOD) LV vol d, MOD A2C: 95.5 ml LV vol d, MOD A4C: 83.0 ml LV vol s, MOD A2C: 53.0 ml LV vol s, MOD A4C: 40.0 ml LV SV MOD A2C:     42.5 ml LV SV MOD A4C:     83.0 ml LV SV MOD BP:      42.1 ml RIGHT VENTRICLE RV S prime:     11.00 cm/s TAPSE (M-mode): 2.1 cm LEFT ATRIUM             Index        RIGHT ATRIUM           Index LA diam:        3.20 cm 1.89 cm/m   RA Area:     15.30 cm LA Vol (A2C):   50.7 ml 29.91 ml/m  RA Volume:   37.40 ml  22.07 ml/m LA Vol (A4C):   40.8 ml 24.07 ml/m LA Biplane Vol: 46.2 ml 27.26 ml/m  AORTIC VALVE LVOT Vmax:   76.35 cm/s LVOT Vmean:  52.750 cm/s LVOT VTI:    0.170 m  AORTA Ao Root diam: 3.50 cm Ao Asc diam:  3.90 cm MITRAL VALVE MV Area (PHT): 3.27 cm       SHUNTS MV Decel Time: 232 msec       Systemic VTI:  0.17 m MR Peak grad:    140.7 mmHg   Systemic Diam: 2.20 cm MR Mean grad:    96.0 mmHg MR Vmax:         593.00 cm/s MR Vmean:        463.0 cm/s MR PISA:         0.57 cm MR PISA Eff ROA: 4 mm MR PISA Radius:  0.30 cm MV E  velocity: 66.90 cm/s MV A velocity: 86.90 cm/s MV E/A ratio:  0.77 Dalton McleanMD Electronically signed by Franki Monte Signature Date/Time: 06/13/2022/4:04:25 PM    Final    VAS US CAROTID  Result Date: 06/13/2022 Carotid Arterial Duplex Study Patient Name:  [name]  Date of Exam:   06/13/2022 Medical Rec #: [id number]      Accession #:    [id number] Date of Birth: Apr 18, 1927     Patient Gender: M Patient Age:   27 years Exam Location:  [facility] Procedure:      VAS US CAROTID Referring Phys: Irene Pap --------------------------------------------------------------------------------  Indications:       CVA. Risk  Factors:      Hypertension, hyperlipidemia, past history of smoking,                    coronary artery disease. Limitations        Today's exam was limited due to Patient movement, respiratory                    variation. Comparison Study:  No prior studies. Performing Technologist: Darlin Coco RDMS, RVT  Examination Guidelines: A complete evaluation includes B-mode imaging, spectral Doppler, color Doppler, and power Doppler as needed of all accessible portions of each vessel. Bilateral testing is considered an integral part of a complete examination. Limited examinations for reoccurring indications may be performed as noted.  Right Carotid Findings: +----------+--------+--------+--------+------------------+--------+           PSV cm/sEDV cm/sStenosisPlaque DescriptionComments +----------+--------+--------+--------+------------------+--------+ CCA Prox  65      16                                         +----------+--------+--------+--------+------------------+--------+ CCA Distal57      16                                         +----------+--------+--------+--------+------------------+--------+ ICA Prox  107     12      1-39%   focal and calcific         +----------+--------+--------+--------+------------------+--------+ ICA Mid   72      15                                          +----------+--------+--------+--------+------------------+--------+ ICA Distal82      21                                         +----------+--------+--------+--------+------------------+--------+ ECA       95                                                 +----------+--------+--------+--------+------------------+--------+ +----------+--------+-------+----------------+-------------------+           PSV cm/sEDV cmsDescribe        Arm Pressure (mmHG) +----------+--------+-------+----------------+-------------------+ [id number]             Multiphasic, WNL                    +----------+--------+-------+----------------+-------------------+ +---------+--------+--+--------+-+---------+ VertebralPSV cm/s52EDV cm/s8Antegrade +---------+--------+--+--------+-+---------+  Left Carotid Findings: +----------+--------+--------+--------+-------------------------+--------+           PSV cm/sEDV cm/sStenosisPlaque Description       Comments +----------+--------+--------+--------+-------------------------+--------+ CCA Prox  60      10                                                +----------+--------+--------+--------+-------------------------+--------+ CCA Distal56      10                                                +----------+--------+--------+--------+-------------------------+--------+  ICA Prox  46      10      1-39%   calcific and heterogenous         +----------+--------+--------+--------+-------------------------+--------+ ICA Mid   70      16                                                +----------+--------+--------+--------+-------------------------+--------+ ICA Distal75      11                                                +----------+--------+--------+--------+-------------------------+--------+ ECA       78                                                         +----------+--------+--------+--------+-------------------------+--------+ +----------+--------+--------+----------------+-------------------+           PSV cm/sEDV cm/sDescribe        Arm Pressure (mmHG) +----------+--------+--------+----------------+-------------------+ [id number]              Multiphasic, WNL                    +----------+--------+--------+----------------+-------------------+ +---------+--------+--+--------+-+---------+ VertebralPSV cm/s48EDV cm/s8Antegrade +---------+--------+--+--------+-+---------+   Summary: Right Carotid: Velocities in the right ICA are consistent with a 1-39% stenosis. Left Carotid: Velocities in the left ICA are consistent with a 1-39% stenosis. Vertebrals:  Bilateral vertebral arteries demonstrate antegrade flow. Subclavians: Normal flow hemodynamics were seen in bilateral subclavian              arteries. *See table(s) above for measurements and observations.  Electronically signed by Antony Contras MD on 06/13/2022 at 3:32:36 PM.    Final    EEG adult  Result Date: 06/13/2022 Kennon Portela, MD     06/13/2022 12:19 PM [facility] Routine EEG Report Demographics: Patient Name:   [name], [name] Date of Birth:   Apr 02, 1927 Identification Number:   MRN - [id number] Study Times: Study Start Time:   06/13/2022 10:29:23 Study End Time:   06/13/2022 10:55:13 Indication(s): Encephalopathy Technical Summary: This EEG was performed utilizing standard International 10-20 System of electrode placement. One channel electrocardiogram was monitored. Data were obtained, stored, and interpreted utilizing referential montage recording, with reformatting to longitudinal, transverse bipolar, and referential montages as necessary for interpretation. State(s):       Awake      Drowsy Activation Procedures: Photic Stimulation: EEG Description: Posterior dominant rhythm is 5-6 hz theta. There are no epileptiform  discharges or focal slowing noted. No sleep architecture identified. Impression: This is an abnormal EEG due to the presence of a moderate encephalopathy of metabolic, toxic, infectious, or neurodegenerative etiology. There are no epileptiform discharges. Clinical correlation is advised. Dr Kennon Portela [facility] For Inpatient follow-up with [facility] physician please call [phone] [phone]. This is not an outpatient service. Post hospital discharge, please contact hospital directly.   MR ANGIO HEAD WO CONTRAST  Result Date: 06/13/2022 CLINICAL DATA:  88 year old male with altered mental status and punctate right superior frontal motor cortex infarct on MRI. EXAM: MRA HEAD WITHOUT CONTRAST TECHNIQUE:  Angiographic images of the Circle of Willis were acquired using MRA technique without intravenous contrast. COMPARISON:  Brain MRI 0204 hours today. FINDINGS: Anterior circulation: Antegrade flow in both ICA siphons. Mildly dolichoectatic, tortuous bilateral siphons with no stenosis. Mildly tortuous carotid termini. Patent MCA and ACA origins without stenosis. Ophthalmic artery origins appear normal. Normal anterior communicating artery. Visible ACA branches are within normal limits. Left MCA M1 and bifurcation are patent without stenosis. Visible left MCA branches are within normal limits. Right MCA M1 segment is patent with mild irregularity and stenosis (series 1055, image 6). Patent right anterior temporal artery origin and right MCA bifurcation with no significant stenosis. Visible right MCA branches are within normal limits. Posterior circulation: Antegrade flow in the posterior circulation. Dominant distal left vertebral artery, the right is diminutive beyond the normal PICA origin. Left PICA origin is normal. Mild distal vertebral irregularity in keeping with atherosclerosis but no significant stenosis. Patent basilar artery without stenosis. AICA, SCA and PCA origins are patent. Posterior  communicating arteries are diminutive or absent. Left PCA occludes in the P 2 segment, superimposed on chronic left PCA territory infarct with encephalomalacia demonstrated on MRI today. There is moderate irregularity and stenosis of right PCA P3 branches. Anatomic variants: Dominant left vertebral artery, the right is diminutive beyond PICA. Other: Left greater than right PCA territory encephalomalacia as seen by MRI. No intracranial mass effect is evident. IMPRESSION: 1. No evidence of emergent large vessel occlusion. 2. 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 Moderate right PCA P3 branch atherosclerosis and stenosis. 3. Dolichoectatic ICAs and evidence of mild right MCA atherosclerosis. No significant anterior circulation stenosis. Electronically Signed   By: Genevie Ann M.D.   On: 06/13/2022 06:19   MR BRAIN WO CONTRAST  Result Date: 06/13/2022 CLINICAL DATA:  Initial evaluation for delirium, altered mental status. EXAM: MRI HEAD WITHOUT CONTRAST TECHNIQUE: Multiplanar, multiecho pulse sequences of the brain and surrounding structures were obtained without intravenous contrast. COMPARISON:  Prior CT from 06/12/2022. FINDINGS: Brain: Cerebral volume within normal limits for age. Generalized age-related cerebral atrophy. Patchy and confluent T2/FLAIR hyperintensity involving the periventricular and deep white matter both cerebral hemispheres, consistent with chronic microvascular ischemic disease, moderately advanced in nature. Chronic bilateral PCA territory infarcts a are seen, left larger than right. Few scattered small remote bilateral cerebellar infarcts present, also slightly worse on the left. Multiple remote lacunar infarcts noted about the deep gray nuclei and hemispheric cerebral white matter. There is a single punctate focus of restricted diffusion involving the posterior right frontal lobe/precentral gyrus (series 7, image 50). Associated signal loss on ADC map (series 8, image 15).  Findings consistent with a tiny acute ischemic infarct. No associated hemorrhage or mass effect. No other convincing evidence for acute or subacute ischemia. No acute intracranial hemorrhage. Multiple chronic micro hemorrhages noted, most notably about the bilateral thalami, likely related to chronic poorly controlled hypertension. No mass lesion, midline shift or mass effect. Ventricular prominence related to global parenchymal volume loss without hydrocephalus. No extra-axial fluid collection. Pituitary gland suprasellar region within normal limits. Vascular: Major intracranial vascular flow voids are maintained. Skull and upper cervical spine: Craniocervical junction within normal limits. Bone marrow signal intensity normal. No scalp soft tissue abnormality. Sinuses/Orbits: Prior bilateral ocular lens replacement. Paranasal sinuses are largely clear. No significant mastoid effusion. Other: None. IMPRESSION: 1. Single punctate acute ischemic nonhemorrhagic infarct involving the posterior right frontal lobe/precentral gyrus. 2. No other acute intracranial abnormality. 3. Age-related cerebral atrophy with moderate  chronic microvascular ischemic disease, with multiple chronic ischemic infarcts as above. Electronically Signed   By: Jeannine Boga M.D.   On: 06/13/2022 02:29   CT HEAD WO CONTRAST (5MM)  Result Date: 06/12/2022 CLINICAL DATA:  Delirium. EXAM: CT HEAD WITHOUT CONTRAST TECHNIQUE: Contiguous axial images were obtained from the base of the skull through the vertex without intravenous contrast. RADIATION DOSE REDUCTION: This exam was performed according to the departmental dose-optimization program which includes automated exposure control, adjustment of the mA and/or kV according to patient size and/or use of iterative reconstruction technique. COMPARISON:  12/02/2010. FINDINGS: Brain: No acute intracranial hemorrhage, midline shift or mass effect. No extra-axial fluid collection. An old infarct  is noted in the left cerebellar hemisphere. There is an old infarct involving the parieto-occipital region on the left. An old infarct is noted in the occipital lobe on the right. Diffuse atrophy is noted. Periventricular white matter hypodensities are noted bilaterally. Vascular: Atherosclerotic calcification of the carotid siphons and vertebral arteries. No hyperdense vessel. Skull: Normal. Negative for fracture or focal lesion. Sinuses/Orbits: No acute finding. Other: None. IMPRESSION: 1. No acute intracranial process. 2. Atrophy with chronic microvascular ischemic changes. 3. Multiple old infarcts bilaterally involving the occipital lobes bilaterally, parietal lobe on the left, and cerebellum on the left. Electronically Signed   By: Brett Fairy M.D.   On: 06/12/2022 21:27   DG Chest Portable 1 View  Result Date: 06/12/2022 CLINICAL DATA:  Pneumonia EXAM: PORTABLE CHEST 1 VIEW COMPARISON:  03/03/2022 FINDINGS: Retrocardiac opacification is again seen likely related to the large hiatal hernia better appreciated on prior examinations. Possible small left pleural effusion. Lungs are otherwise clear. No pneumothorax. No pleural effusion on the right. Cardiac size within normal limits. Pulmonary vascularity is normal. No acute bone abnormality. IMPRESSION: 1. Retrocardiac opacification likely related to known large hiatal hernia. 2. Possible small left pleural effusion. Electronically Signed   By: Fidela Salisbury M.D.   On: 06/12/2022 19:48    Microbiology: Results for orders placed or performed during the hospital encounter of 06/12/22  Resp Panel by RT-PCR (Flu A&B, Covid) Urine, Clean Catch     Status: None   Collection Time: 06/12/22  7:20 PM   Specimen: Urine, Clean Catch; Nasal Swab  Result Value Ref Range Status   SARS Coronavirus 2 by RT PCR NEGATIVE NEGATIVE Final    Comment: (NOTE) SARS-CoV-2 target nucleic acids are NOT DETECTED.  The SARS-CoV-2 RNA is generally detectable in upper  respiratory specimens during the acute phase of infection. The lowest concentration of SARS-CoV-2 viral copies this assay can detect is 138 copies/mL. A negative result does not preclude SARS-Cov-2 infection and should not be used as the sole basis for treatment or other patient management decisions. A negative result may occur with  improper specimen collection/handling, submission of specimen other than nasopharyngeal swab, presence of viral mutation(s) within the areas targeted by this assay, and inadequate number of viral copies(<138 copies/mL). A negative result must be combined with clinical observations, patient history, and epidemiological information. The expected result is Negative.  Fact Sheet for Patients:  EntrepreneurPulse.com.au  Fact Sheet for Healthcare Providers:  IncredibleEmployment.be  This test is no t yet approved or cleared by the [place] FDA and  has been authorized for detection and/or diagnosis of SARS-CoV-2 by FDA under an Emergency Use Authorization (EUA). This EUA will remain  in effect (meaning this test can be used) for the duration of the COVID-19 declaration under Section 564(b)(1) of the Act,  21 U.S.C.section 360bbb-3(b)(1), unless the authorization is terminated  or revoked sooner.       Influenza A by PCR NEGATIVE NEGATIVE Final   Influenza B by PCR NEGATIVE NEGATIVE Final    Comment: (NOTE) The Xpert Xpress SARS-CoV-2/FLU/RSV plus assay is intended as an aid in the diagnosis of influenza from Nasopharyngeal swab specimens and should not be used as a sole basis for treatment. Nasal washings and aspirates are unacceptable for Xpert Xpress SARS-CoV-2/FLU/RSV testing.  Fact Sheet for Patients: EntrepreneurPulse.com.au  Fact Sheet for Healthcare Providers: IncredibleEmployment.be  This test is not yet approved or cleared by the [place] FDA and has been  authorized for detection and/or diagnosis of SARS-CoV-2 by FDA under an Emergency Use Authorization (EUA). This EUA will remain in effect (meaning this test can be used) for the duration of the COVID-19 declaration under Section 564(b)(1) of the Act, 21 U.S.C. section 360bbb-3(b)(1), unless the authorization is terminated or revoked.  Performed at [facility], [place] [address], [place], [place] [address]     Labs: CBC: Recent Labs  Lab 06/13/22 0059 06/13/22 0409  WBC 4.7  --   HGB 10.2* 9.9*  HCT 30.6* 29.0*  MCV 93.9  --   PLT 105*  --    Basic Metabolic Panel: Recent Labs  Lab 06/12/22 2031 06/13/22 0059 06/13/22 0409 06/14/22 1018  NA 137  --  137 137  K 3.8  --  3.6 3.5  CL 104  --   --  107  CO2 25  --   --  22  GLUCOSE 87  --   --  161*  BUN 40*  --   --  31*  CREATININE 3.58* 3.17*  --  3.00*  CALCIUM 8.6*  --   --  8.2*   Liver Function Tests: Recent Labs  Lab 06/12/22 2031  AST 15  ALT 11  ALKPHOS 59  BILITOT 0.8  PROT 5.0*  ALBUMIN 2.7*   CBG: No results for input(s): "GLUCAP" in the last 168 hours.  Discharge time spent: less than 30 minutes.  Signed: Murray Hodgkins, MD [facility] 06/14/2022

## 2022-06-14 NOTE — Progress Notes (Signed)
Initial Nutrition Assessment  DOCUMENTATION CODES:  Not applicable  INTERVENTION:  Continue current diet as ordered, adjust to ordering assist from kitchen.  Nursing staff to assist with feeding when family is not present MVI with minerals daily Ensure Enlive po BID, each supplement provides 350 kcal and 20 grams of protein. Magic cup TID with meals, each supplement provides 290 kcal and 9 grams of protein   NUTRITION DIAGNOSIS:   Inadequate oral intake related to lethargy/confusion as evidenced by per patient/family report.  GOAL:   Patient will meet greater than or equal to 90% of their needs  MONITOR:   PO intake, Supplement acceptance  REASON FOR ASSESSMENT:   Malnutrition Screening Tool    ASSESSMENT:  Pt with hx of cancer (prostate, skin), HLD, HTN, CHF, osteoporosis, CKD4, esophageal stenosis, blindness, and a large hiatal hernia, sent to ED by family for AMS and agitation. 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  Unable to obtain nutrition hx from pt over the phone due to pt's altered mental status. No intake recorded in chart at this time but family noted decreased intake at home recently. SLP evaluated and cleared pt for regular diet but noted he would need assistance with eating and full supervision, added this request to RN orders and changed to ordering assist. Will add magic cup and ensure to augment intake.  5.9% weight loss noted over the last 7 months which is not severe for this timeframe, but undesirable with pt's advanced age.    Nutritionally Relevant Medications: Scheduled Meds:  atorvastatin  40 mg Oral QHS   [START ON 06/16/2022] cyanocobalamin  1,000 mcg Intramuscular Q30 days   doxazosin  4 mg Oral QHS   Continuous Infusions:  dextrose 5% lactated ringers 50 mL/hr at 06/14/22 0100   PRN Meds: polyethylene glycol, prochlorperazine  Labs Reviewed: BUN 40, creatinine 3.17  NUTRITION -  FOCUSED PHYSICAL EXAM: Defer to in-person assessment  Diet Order:   Diet Order             Diet regular Room service appropriate? Yes with Assist; Fluid consistency: Thin  Diet effective now                   EDUCATION NEEDS:   Not appropriate for education at this time  Skin:  Skin Assessment: Reviewed RN Assessment  Last BM:  12/6 - type 6  Height:   Ht Readings from Last 1 Encounters:  06/13/22 '5\' 7"'$  (1.702 m)    Weight:   Wt Readings from Last 1 Encounters:  06/13/22 60.2 kg    Ideal Body Weight:  67.3 kg  BMI:  Body mass index is 20.79 kg/m.  Estimated Nutritional Needs:   Kcal:  1500-1700 kcal/d  Protein:  70-90g/d  Fluid:  >/=1.5L/d    Ranell Patrick, RD, LDN Clinical Dietitian RD pager # available in [facility]  After hours/weekend pager # available in [facility]

## 2022-06-14 NOTE — Progress Notes (Signed)
Physical Therapy Treatment Patient Details Name: [name] MRN: [id number] DOB: [birth year] Today's Date: 06/14/2022   History of Present Illness [name] is a 88 y.o. male  who presents with lethargy and confusion. 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   PMH significant for HLD, prostrate cancer, hypothyroidism, prostrate cancer with mets, HTN, malnutrition and anorexia.  Receiving Palliative services PTA    PT Comments    Patient eager to get OOB. Upon standing, pt with incontinence of bowels and returned to sit on EOB. BSC retrieved and assisted pt to [facility] where he +BM. While seated on BSC, HR varied 118-136 bpm. After standing for pericare, pt ambulated short distance in room (~25 ft) with minguard assist and RW. HR max 158 bpm with pt asymptomatic. Patient returned to bed due to lack of chair alarm in room and pt confused. Bed alarm set and all items in reach.    Recommendations for follow up therapy are one component of a multi-disciplinary discharge planning process, led by the attending physician.  Recommendations may be updated based on patient status, additional functional criteria and insurance authorization.  Follow Up Recommendations  Home health PT (if plan is to continue Palliative care with caregivers then HHPT if able to receive services, must have 24 hour care)     Assistance Recommended at Discharge Frequent or constant Supervision/Assistance  Patient can return home with the following A little help with walking and/or transfers;A little help with bathing/dressing/bathroom;Assistance with cooking/housework;Assist for transportation;Help with stairs or ramp for entrance;Direct supervision/assist for medications management;Direct supervision/assist for financial management   Equipment Recommendations  None recommended by PT    Recommendations for Other Services       Precautions / Restrictions Precautions Precautions: Fall Restrictions Weight Bearing  Restrictions: No     Mobility  Bed Mobility Overal bed mobility: Needs Assistance Bed Mobility: Supine to Sit     Supine to sit: Min assist     General bed mobility comments: reaching out for hand to rise and requiring min A to elevate trunk    Transfers Overall transfer level: Needs assistance Equipment used: Rolling walker (2 wheels) Transfers: Sit to/from Stand, Bed to chair/wheelchair/BSC Sit to Stand: Min guard   Step pivot transfers: Min guard       General transfer comment: Min guard A for safety.    Ambulation/Gait Ambulation/Gait assistance: Min guard Gait Distance (Feet): 25 Feet Assistive device: Rolling walker (2 wheels) Gait Pattern/deviations: Step-through pattern, Decreased stride length   Gait velocity interpretation: 1.31 - 2.62 ft/sec, indicative of limited community ambulator   General Gait Details: Hr elevated to 158 during ambulation (down to 76 at end of session). Patient unsure if he uses a RW at home, but did well maneuvreriing in tight spaces.   Stairs             Wheelchair Mobility    Modified Rankin (Stroke Patients Only) Modified Rankin (Stroke Patients Only) Pre-Morbid Rankin Score: Moderately severe disability Modified Rankin: Moderately severe disability     Balance Overall balance assessment: Needs assistance Sitting-balance support: No upper extremity supported, Feet supported Sitting balance-Leahy Scale: Fair Sitting balance - Comments: supervision for sitting EOB for safety   Standing balance support: Bilateral upper extremity supported, During functional activity Standing balance-Leahy Scale: Poor Standing balance comment: reliant on RW                            Cognition  Arousal/Alertness: Awake/alert Behavior During Therapy: WFL for tasks assessed/performed, Impulsive Overall Cognitive Status: No family/caregiver present to determine baseline cognitive functioning                                  General Comments: Pt not oriented. Knows his name, but unable to report any additional orientation information. Knows he is not at home and that people help him at home. He thinks they are his children, but unable to state how many children he has.Following all one step commands with increased time. Unaware he was having BM on standing        Exercises      General Comments General comments (skin integrity, edema, etc.): HR max of 104 bpm. VSS throughout      Pertinent Vitals/Pain Pain Assessment Pain Assessment: No/denies pain Breathing: normal Negative Vocalization: none Facial Expression: smiling or inexpressive Body Language: relaxed Consolability: no need to console PAINAD Score: 0    Home Living Family/patient expects to be discharged to:: Hospice/Palliative care                 Home Equipment: [place] (2 wheels) Additional Comments: Pt unreliable historian however chart states that pt lives with family and has caregivers    Prior Function            PT Goals (current goals can now be found in the care plan section) Acute Rehab PT Goals Patient Stated Goal: unable to state Time For Goal Achievement: 06/27/22 Potential to Achieve Goals: Fair Progress towards PT goals: Progressing toward goals    Frequency    Min 4X/week      PT Plan Current plan remains appropriate    Co-evaluation              AM-PAC PT "6 Clicks" Mobility   Outcome Measure  Help needed turning from your back to your side while in a flat bed without using bedrails?: A Little Help needed moving from lying on your back to sitting on the side of a flat bed without using bedrails?: A Little Help needed moving to and from a bed to a chair (including a wheelchair)?: A Little Help needed standing up from a chair using your arms (e.g., wheelchair or bedside chair)?: A Little Help needed to walk in hospital room?: A Little Help needed climbing 3-5 steps with  a railing? : A Lot 6 Click Score: 17    End of Session Equipment Utilized During Treatment: Gait belt Activity Tolerance: Patient tolerated treatment well;Treatment limited secondary to medical complications (Comment) (HR up to 158 with short distance ambulation) Patient left: with call bell/phone within reach;in bed;with bed alarm set (on stretcher) Nurse Communication: Mobility status;Other (comment) (no chair alarm in room; returned to bed; incontinent of bowels upon standing) PT Visit Diagnosis: Muscle weakness (generalized) (M62.81)     Time: 1937-9024 PT Time Calculation (min) (ACUTE ONLY): 27 min  Charges:  $Gait Training: 23-37 mins                      [place]  Office [phone]    Rexanne Mano 06/14/2022, 11:10 AM

## 2022-06-14 NOTE — TOC Initial Note (Signed)
Transition of Care [facility]) - Initial/Assessment Note    Patient Details  Name: [name] MRN: [id number] Date of Birth: 08/24/1926  [facility] CM/SW Contact:    Pollie Friar, RN Phone Number: 06/14/2022, 12:15 PM  Clinical Narrative:                 Pt is from home with his spouse and grandson. The grandson is there at night and the patient has a caregiver there from 9am to 9 pm. He also has caregivers on the weekend. Pt is only without a "care person" for about a couple hours a day.  Family provides needed transportation. The grandson does his medications in a pill box and the caregivers assist him in taking them.  Home health recommended. CM spoke to the patients daughter in law and she states they already use [facility] for pts spouse and asked to use them for him. [facility] has accepted and information on the AVS.  Pt is active with [facility] for palliative care in the home.  TOC following for further d/c needs.   Expected Discharge Plan: [place] Barriers to Discharge: Continued Medical Work up   Patient Goals and CMS Choice   CMS Medicare.gov Compare Post Acute Care list provided to:: Patient Represenative (must comment) Choice offered to / list presented to : Adult Children  Expected Discharge Plan and Services Expected Discharge Plan: [place]   Discharge Planning Services: CM Consult Post Acute Care Choice: [place] arrangements for the past 2 months: Single Family Home                           HH Arranged: PT, OT HH Agency: [place] Date [place]: 06/14/22   Representative spoke with at [place]: [place] Arrangements/Services Living arrangements for the past 2 months: [place] Lives with:: Spouse Patient language and need for interpreter reviewed:: Yes Do you feel safe going back to the place where you live?: Yes      Need for Family  Participation in Patient Care: Yes (Comment) Care giver support system in place?: Yes (comment) Current home services: DME, Homehealth aide (shower seat/ walker/ wheelchair/ BSC) Criminal Activity/Legal Involvement Pertinent to Current Situation/Hospitalization: No - Comment as needed  Activities of Daily Living Home Assistive Devices/Equipment: Walker (specify type) ADL Screening (condition at time of admission) Patient's cognitive ability adequate to safely complete daily activities?: No Is the patient deaf or have difficulty hearing?: No Does the patient have difficulty seeing, even when wearing glasses/contacts?: Yes Does the patient have difficulty concentrating, remembering, or making decisions?: Yes Patient able to express need for assistance with ADLs?: No Does the patient have difficulty dressing or bathing?: Yes Independently performs ADLs?: No Communication: Independent Dressing (OT): Dependent Is this a change from baseline?: Change from baseline, expected to last <3days Grooming: Dependent Is this a change from baseline?: Pre-admission baseline Feeding: Needs assistance Is this a change from baseline?: Change from baseline, expected to last <3 days Bathing: Needs assistance Is this a change from baseline?: Pre-admission baseline Toileting: Dependent Is this a change from baseline?: Change from baseline, expected to last <3 days In/Out Bed: Dependent Is this a change from baseline?: Change from baseline, expected to last <3 days Walks in Home: Independent with device (comment) Does the patient have difficulty walking or climbing stairs?: Yes Weakness of Legs: Both Weakness  of Arms/Hands: Both  Permission Sought/Granted                  Emotional Assessment Appearance:: Appears stated age Attitude/Demeanor/Rapport: Engaged Affect (typically observed): Accepting Orientation: : Oriented to Self   Psych Involvement: No (comment)  Admission diagnosis:  Delirium  [R41.0] Patient Active Problem List   Diagnosis Date Noted   Delirium 31/54/0086   Acute metabolic encephalopathy 76/19/5093   Stroke ([place]) 06/13/2022   Goals of care, counseling/discussion 02/13/2022   Androgen deprivation therapy 02/13/2022   Elevated PSA 06/20/2021   Low BP 04/08/2020   Kidney failure 04/08/2020   Congestive heart failure ([place]) 04/08/2020   Acidosis 05/27/2019   Anemia of chronic kidney failure, unspecified stage 05/27/2019   Benign hypertensive kidney disease with chronic kidney disease 05/27/2019   Proteinuria 05/27/2019   Secondary hyperparathyroidism of renal origin ([place]) 05/27/2019   Blind 04/08/2019   CAD (coronary artery disease) 04/08/2019   Thrombocytopathia ([place]) 04/08/2019   Choledocholithiasis    Elevated liver enzymes    Stricture and stenosis of esophagus    Malnutrition of moderate degree 04/03/2019   Chronic diastolic (congestive) heart failure ([place]) 03/31/2019   Malignant neoplasm of prostate ([place]) 03/31/2019   Weakness    Elevated LFTs    CKD (chronic kidney disease), stage IV (HCC)    Aortic atherosclerosis ([place])    Bilateral nephrolithiasis    Hydronephrosis concurrent with and due to calculi of kidney and ureter    Acute encephalopathy 03/08/2018   Gallbladder abscess 07/31/2016   Cholecystitis 07/20/2016   Hematuria 06/25/2016   Sepsis ([place]) 06/25/2016   Acute cholecystitis    Hypokalemia 07/23/2015   Generalized weakness 07/23/2015   Bladder outlet obstruction 07/23/2015   Acute delirium 07/23/2015   Benign essential HTN 07/23/2015   Acute on chronic renal failure ([place]) 07/20/2015   GIB (gastrointestinal bleeding) 06/03/2015   Kidney stone 05/25/2012   Ureteric stone 05/25/2012   Hydronephrosis 05/25/2012   Chronic kidney disease, stage IV (severe) ([place]) 05/25/2012   PCP:  Sandrea Hughs, NP Pharmacy:   [facility], [place] - [address] [place] [address] Phone: [phone]  Fax: [phone]  [id number] [address]: [phone]: [phone] [facility] [address]Phone: [phone]: [phone]    Social Determinants of Health (SDOH) Interventions    Readmission Risk Interventions     No data to display

## 2022-06-14 NOTE — TOC Transition Note (Signed)
Transition of Care [facility]) - CM/SW Discharge Note   Patient Details  Name: [name] MRN: [id number] Date of Birth: 04/03/1927  [facility]) CM/SW Contact:  Pollie Friar, RN Phone Number: 06/14/2022, 1:31 PM   Clinical Narrative:    Pt is discharging home with [facility] through [place].  DIL to provide transport home.   Final next level of care: Home w Home Health Services Barriers to Discharge: No Barriers Identified   Patient Goals and CMS Choice   CMS Medicare.gov Compare Post Acute Care list provided to:: Patient Represenative (must comment) Choice offered to / list presented to : Adult Children  Discharge Placement                       Discharge Plan and Services   Discharge Planning Services: CM Consult Post Acute Care Choice: Home Health                    HH Arranged: PT, OT [facility] Agency: [place] Date [facility] Agency Contacted: 06/14/22   Representative spoke with at [place]: Jenny Reichmann  Social Determinants of Health ([place]) Interventions     Readmission Risk Interventions     No data to display

## 2022-06-14 NOTE — Evaluation (Signed)
Occupational Therapy Evaluation Patient Details Name: DACOTA RUBEN MRN: [id number] DOB: 1927/02/05 Today's Date: 06/14/2022   History of Present Illness OMA ALPERT is a 88 y.o. male  who presents with lethargy and confusion. 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   PMH significant for HLD, prostrate cancer, hypothyroidism, prostrate cancer with mets, HTN, malnutrition and anorexia.  Receiving Palliative services PTA   Clinical Impression   PTA, pt received assistance with IADL, bathing, and occasionally toileting per healthcare power of attorney. Upon eval, pt requires min guard A for functional mobility with RW, min A with UB ADL, and min A with LB ADL. Pt following all simple commands. Soiled on arrival and pt unaware; also with active BM upon standing and requiring total A for posterior pericare in standing. Per healthcare power of attorney (via phone call), pt continues to be different from baseline and she wishes to pursue HHOT to optimize return to daily roles and routines. Thus, recommending HHOT to optimize safety and independence in ADL and IADL.      Recommendations for follow up therapy are one component of a multi-disciplinary discharge planning process, led by the attending physician.  Recommendations may be updated based on patient status, additional functional criteria and insurance authorization.   Follow Up Recommendations  Home health OT     Assistance Recommended at Discharge Frequent or constant Supervision/Assistance  Patient can return home with the following A little help with walking and/or transfers;A little help with bathing/dressing/bathroom;Assistance with cooking/housework;Direct supervision/assist for financial management;Assist for transportation;Assistance with feeding;Direct supervision/assist for medications management;Help with stairs or ramp for entrance    Functional Status Assessment  Patient has had a recent decline in their functional status and  demonstrates the ability to make significant improvements in function in a reasonable and predictable amount of time.  Equipment Recommendations  None recommended by OT    Recommendations for Other Services       Precautions / Restrictions Precautions Precautions: Fall Restrictions Weight Bearing Restrictions: No      Mobility Bed Mobility Overal bed mobility: Needs Assistance Bed Mobility: Rolling, Supine to Sit Rolling: Min guard   Supine to sit: Min assist     General bed mobility comments: Pt rolls without assist; reaching out for hand to rise and requiring min A to elevate trunk    Transfers Overall transfer level: Needs assistance Equipment used: Rolling walker (2 wheels) Transfers: Sit to/from Stand Sit to Stand: Min guard           General transfer comment: Min guard A for safety. Cues to avoid standing without OT next to pt once EOB.      Balance Overall balance assessment: Needs assistance Sitting-balance support: No upper extremity supported, Feet supported Sitting balance-Leahy Scale: Fair Sitting balance - Comments: supervision for sitting EOB for safety   Standing balance support: Bilateral upper extremity supported, During functional activity Standing balance-Leahy Scale: Poor Standing balance comment: reliant on RW                           ADL either performed or assessed with clinical judgement   ADL Overall ADL's : Needs assistance/impaired Eating/Feeding: Set up;Bed level Eating/Feeding Details (indicate cue type and reason): Pt eating at end of session, requriing set-up to take lids off of containers, and cues for placement of food items due to low vision Grooming: Wash/dry face;Sitting;Set up Grooming Details (indicate cue type and reason): Washing face on command  sitting EOB with no physical assist. Upper Body Bathing: Minimal assistance;Cueing for sequencing;Sitting   Lower Body Bathing: Sitting/lateral leans;Cueing for  sequencing;Minimal assistance   Upper Body Dressing : Minimal assistance;Sitting   Lower Body Dressing: Minimal assistance   Toilet Transfer: Min guard;Stand-pivot;Rolling walker (2 wheels) Toilet Transfer Details (indicate cue type and reason): Min gaurd A to stand from EOB and take a few steps forward. With active BM, upon standing so deferring further steps due to pt needing to be cleaned up afterward Toileting- Clothing Manipulation and Hygiene: Total assistance;Sit to/from stand Toileting - Clothing Manipulation Details (indicate cue type and reason): Pt maintaining balance with min guard A with OT cleaning for posterior pericare.     Functional mobility during ADLs: Min guard;Rolling walker (2 wheels) (very short distance this session)       Vision Baseline Vision/History: 2 Legally blind Ability to See in Adequate Light: 3 Highly impaired Patient Visual Report: No change from baseline Additional Comments: Pt is legally blind. Can see light/shadows as he was aware of OT pushing the "square box" (IV pole) to other side of bed as well as commenting on stature of this OT before standing.     Perception Perception Perception Tested?: No   Praxis Praxis Praxis tested?: Not tested    Pertinent Vitals/Pain Pain Assessment Pain Assessment: No/denies pain     Hand Dominance Right   Extremity/Trunk Assessment Upper Extremity Assessment Upper Extremity Assessment: Generalized weakness   Lower Extremity Assessment Lower Extremity Assessment: Generalized weakness   Cervical / Trunk Assessment Cervical / Trunk Assessment: Normal   Communication Communication Communication: No difficulties;Other (comment) (slightly hard of hearing, especially in the presence of background noise)   Cognition Arousal/Alertness: Awake/alert Behavior During Therapy: WFL for tasks assessed/performed, Impulsive Overall Cognitive Status: No family/caregiver present to determine baseline cognitive  functioning                                 General Comments: Pt not oriented. Knows his name, but unable to report any additional orientation information. Knows he is not at home and that people help him at home. He thinks they are his children, but unable to state how many children he has.Following all one step commands with increased time. Good skill to locate food items on his plate with directional cues. Unaware he was having BM on standing     General Comments  HR max of 104 bpm. VSS throughout    Exercises     Shoulder Instructions      Home Living Family/patient expects to be discharged to:: Hospice/Palliative care                             Home Equipment: [place] (2 wheels)   Additional Comments: Pt unreliable historian however chart states that pt lives with family and has caregivers      Prior Functioning/Environment Prior Level of Function : Needs assist  Cognitive Assist : Mobility (cognitive);ADLs (cognitive)           Mobility Comments: Chart states assist with use of walker and caregiver ADLs Comments: chart states pt has assist with ADLS        OT Problem List: Decreased strength;Decreased activity tolerance;Impaired balance (sitting and/or standing);Impaired vision/perception;Decreased cognition;Decreased safety awareness;Decreased knowledge of use of DME or AE      OT Treatment/Interventions: Self-care/ADL training;Therapeutic exercise;DME and/or AE instruction;Therapeutic  activities;Cognitive remediation/compensation;Visual/perceptual remediation/compensation;Patient/family education;Balance training    OT Goals(Current goals can be found in the care plan section) Acute Rehab OT Goals Patient Stated Goal: go home OT Goal Formulation: With patient Time For Goal Achievement: 06/28/22 Potential to Achieve Goals: Good  OT Frequency: Min 2X/week    Co-evaluation              AM-PAC OT "6 Clicks" Daily Activity      Outcome Measure Help from another person eating meals?: A Little Help from another person taking care of personal grooming?: A Little Help from another person toileting, which includes using toliet, bedpan, or urinal?: A Lot Help from another person bathing (including washing, rinsing, drying)?: A Lot Help from another person to put on and taking off regular upper body clothing?: A Little Help from another person to put on and taking off regular lower body clothing?: A Lot 6 Click Score: 15   End of Session Equipment Utilized During Treatment: Gait belt;Rolling walker (2 wheels) Nurse Communication: Mobility status  Activity Tolerance: Patient tolerated treatment well Patient left: in bed;with call bell/phone within reach;with bed alarm set  OT Visit Diagnosis: Unsteadiness on feet (R26.81);Muscle weakness (generalized) (M62.81);Low vision, both eyes (H54.2);Other symptoms and signs involving cognitive function;Other abnormalities of gait and mobility (R26.89)                Time: 3810-1751 OT Time Calculation (min): 35 min Charges:  OT General Charges $OT Visit: 1 Visit OT Evaluation $OT Eval Low Complexity: 1 Low OT Treatments $Self Care/Home Management : 8-22 mins  Elder Cyphers, OTR/L [facility] Acute Rehabilitation Office: [phone]   Magnus Ivan 06/14/2022, 10:43 AM

## 2022-06-15 ENCOUNTER — Telehealth: Payer: Self-pay

## 2022-06-15 NOTE — Patient Outreach (Addendum)
  Care Coordination TOC Note Transition Care Management Follow-up Telephone Call Date of discharge and from where: 06/14/22-[facility] Dx: "CVA" How have you been since you were released from the hospital? Call completed with dauhgter in law(Karen-DPR on file). She reports that patient is still asleep. He had a good evening last night. He has had no complaints.  Any questions or concerns? No  Items Reviewed: Did the pt receive and understand the discharge instructions provided? Yes  Medications obtained and verified? Yes  Other? Yes  Any new allergies since your discharge? No  Dietary orders reviewed? Yes Do you have support at home? Yes -family as well as paid caregivers   Home Care and Equipment/Supplies: Were home health services ordered? yes If so, what is the name of the agency? [facility]  Has the agency set up a time to come to the patient's home? No-Caregiver aware to call agency and has contact info if she does not her from them in 48-72hrs post discharge Were any new equipment or medical supplies ordered?  No What is the name of the medical supply agency? N/A Were you able to get the supplies/equipment? not applicable Do you have any questions related to the use of the equipment or supplies? No  Functional Questionnaire: (I = Independent and D = Dependent) ADLs: A  Bathing/Dressing- A  Meal Prep- A  Eating- I  Maintaining continence- A  Transferring/Ambulation- A  Managing Meds- A  Follow up appointments reviewed:  PCP Hospital f/u appt confirmed?  Caregiver voices that patient no longer followed by [facility]. His new PCP is with [facility]. Caregiver called them yesterday and is awaiting return call from them today.  Marland Kitchen [facility] f/u appt confirmed?  Caregiver will call specialist office today to make appt . Are transportation arrangements needed? No  If their condition worsens, is the pt aware to call PCP or go to the Emergency Dept.?  Yes Was the patient provided with contact information for the PCP's office or ED? Yes Was to pt encouraged to call back with questions or concerns? Yes  SDOH assessments and interventions completed:   Yes SDOH Interventions Today    Flowsheet Row Most Recent Value  SDOH Interventions   Food Insecurity Interventions Intervention Not Indicated  Transportation Interventions Intervention Not Indicated       Care Coordination Interventions:  Education provided    Encounter Outcome:  Pt. Visit Completed     Enzo Montgomery, RN,BSN,CCM [place] Management Telephonic Care Management Coordinator Direct Phone: [phone] Toll Free: [phone] Fax: [phone]

## 2022-06-25 ENCOUNTER — Other Ambulatory Visit: Payer: Self-pay | Admitting: Otolaryngology

## 2022-06-25 DIAGNOSIS — R221 Localized swelling, mass and lump, neck: Secondary | ICD-10-CM

## 2022-06-26 ENCOUNTER — Emergency Department: Payer: Medicare Other

## 2022-06-26 ENCOUNTER — Inpatient Hospital Stay: Admission: RE | Admit: 2022-06-26 | Payer: Medicare Other | Source: Ambulatory Visit

## 2022-06-26 ENCOUNTER — Other Ambulatory Visit: Payer: Self-pay

## 2022-06-26 ENCOUNTER — Inpatient Hospital Stay
Admission: EM | Admit: 2022-06-26 | Discharge: 2022-06-29 | DRG: 391 | Disposition: A | Discharge status: Home Health | Payer: Medicare Other | Attending: Internal Medicine | Admitting: Internal Medicine

## 2022-06-26 DIAGNOSIS — E876 Hypokalemia: Secondary | ICD-10-CM | POA: Diagnosis present

## 2022-06-26 DIAGNOSIS — Z8546 Personal history of malignant neoplasm of prostate: Secondary | ICD-10-CM

## 2022-06-26 DIAGNOSIS — Z880 Allergy status to penicillin: Secondary | ICD-10-CM

## 2022-06-26 DIAGNOSIS — Z87891 Personal history of nicotine dependence: Secondary | ICD-10-CM

## 2022-06-26 DIAGNOSIS — K529 Noninfective gastroenteritis and colitis, unspecified: Principal | ICD-10-CM

## 2022-06-26 DIAGNOSIS — I5032 Chronic diastolic (congestive) heart failure: Secondary | ICD-10-CM | POA: Diagnosis present

## 2022-06-26 DIAGNOSIS — K5730 Diverticulosis of large intestine without perforation or abscess without bleeding: Principal | ICD-10-CM | POA: Diagnosis present

## 2022-06-26 DIAGNOSIS — I129 Hypertensive chronic kidney disease with stage 1 through stage 4 chronic kidney disease, or unspecified chronic kidney disease: Secondary | ICD-10-CM | POA: Diagnosis present

## 2022-06-26 DIAGNOSIS — Z79890 Hormone replacement therapy (postmenopausal): Secondary | ICD-10-CM

## 2022-06-26 DIAGNOSIS — N184 Chronic kidney disease, stage 4 (severe): Secondary | ICD-10-CM | POA: Diagnosis present

## 2022-06-26 DIAGNOSIS — Z7901 Long term (current) use of anticoagulants: Secondary | ICD-10-CM

## 2022-06-26 DIAGNOSIS — E039 Hypothyroidism, unspecified: Secondary | ICD-10-CM | POA: Diagnosis present

## 2022-06-26 DIAGNOSIS — R197 Diarrhea, unspecified: Secondary | ICD-10-CM | POA: Diagnosis not present

## 2022-06-26 DIAGNOSIS — R112 Nausea with vomiting, unspecified: Secondary | ICD-10-CM | POA: Diagnosis not present

## 2022-06-26 DIAGNOSIS — Z20822 Contact with and (suspected) exposure to covid-19: Secondary | ICD-10-CM | POA: Diagnosis present

## 2022-06-26 DIAGNOSIS — Z9049 Acquired absence of other specified parts of digestive tract: Secondary | ICD-10-CM

## 2022-06-26 DIAGNOSIS — I493 Ventricular premature depolarization: Secondary | ICD-10-CM | POA: Diagnosis present

## 2022-06-26 DIAGNOSIS — Z888 Allergy status to other drugs, medicaments and biological substances status: Secondary | ICD-10-CM

## 2022-06-26 DIAGNOSIS — I2510 Atherosclerotic heart disease of native coronary artery without angina pectoris: Secondary | ICD-10-CM | POA: Diagnosis present

## 2022-06-26 DIAGNOSIS — Z8249 Family history of ischemic heart disease and other diseases of the circulatory system: Secondary | ICD-10-CM

## 2022-06-26 DIAGNOSIS — R824 Acetonuria: Secondary | ICD-10-CM | POA: Diagnosis present

## 2022-06-26 DIAGNOSIS — I10 Essential (primary) hypertension: Secondary | ICD-10-CM | POA: Diagnosis present

## 2022-06-26 DIAGNOSIS — M810 Age-related osteoporosis without current pathological fracture: Secondary | ICD-10-CM | POA: Diagnosis present

## 2022-06-26 DIAGNOSIS — Z85828 Personal history of other malignant neoplasm of skin: Secondary | ICD-10-CM

## 2022-06-26 DIAGNOSIS — E860 Dehydration: Secondary | ICD-10-CM | POA: Diagnosis present

## 2022-06-26 DIAGNOSIS — E7800 Pure hypercholesterolemia, unspecified: Secondary | ICD-10-CM | POA: Diagnosis present

## 2022-06-26 DIAGNOSIS — A09 Infectious gastroenteritis and colitis, unspecified: Secondary | ICD-10-CM | POA: Diagnosis present

## 2022-06-26 DIAGNOSIS — J690 Pneumonitis due to inhalation of food and vomit: Secondary | ICD-10-CM | POA: Diagnosis present

## 2022-06-26 DIAGNOSIS — Z7982 Long term (current) use of aspirin: Secondary | ICD-10-CM

## 2022-06-26 LAB — URINALYSIS, ROUTINE W REFLEX MICROSCOPIC
Bacteria, UA: NONE SEEN
Bilirubin Urine: NEGATIVE
Glucose, UA: NEGATIVE mg/dL
Ketones, ur: 5 mg/dL — AB
Leukocytes,Ua: NEGATIVE
Nitrite: NEGATIVE
Protein, ur: 30 mg/dL — AB
Specific Gravity, Urine: 1.016 (ref 1.005–1.030)
pH: 5 (ref 5.0–8.0)

## 2022-06-26 LAB — COMPREHENSIVE METABOLIC PANEL
ALT: 11 U/L (ref 0–44)
AST: 16 U/L (ref 15–41)
Albumin: 3.2 g/dL — ABNORMAL LOW (ref 3.5–5.0)
Alkaline Phosphatase: 73 U/L (ref 38–126)
Anion gap: 9 (ref 5–15)
BUN: 39 mg/dL — ABNORMAL HIGH (ref 8–23)
CO2: 22 mmol/L (ref 22–32)
Calcium: 8.8 mg/dL — ABNORMAL LOW (ref 8.9–10.3)
Chloride: 110 mmol/L (ref 98–111)
Creatinine, Ser: 3.39 mg/dL — ABNORMAL HIGH (ref 0.61–1.24)
GFR, Estimated: 16 mL/min — ABNORMAL LOW (ref >60)
Glucose, Bld: 87 mg/dL (ref 70–99)
Potassium: 3.6 mmol/L (ref 3.5–5.1)
Sodium: 141 mmol/L (ref 135–145)
Total Bilirubin: 1.3 mg/dL — ABNORMAL HIGH (ref 0.3–1.2)
Total Protein: 6.1 g/dL — ABNORMAL LOW (ref 6.5–8.1)

## 2022-06-26 LAB — RESP PANEL BY RT-PCR (RSV, FLU A&B, COVID)  RVPGX2
Influenza A by PCR: NEGATIVE
Influenza B by PCR: NEGATIVE
Resp Syncytial Virus by PCR: NEGATIVE
SARS Coronavirus 2 by RT PCR: NEGATIVE

## 2022-06-26 LAB — CBC WITH DIFFERENTIAL/PLATELET
Abs Immature Granulocytes: 0.02 10*3/uL (ref 0.00–0.07)
Basophils Absolute: 0 10*3/uL (ref 0.0–0.1)
Basophils Relative: 0 %
Eosinophils Absolute: 0 10*3/uL (ref 0.0–0.5)
Eosinophils Relative: 0 %
HCT: 34.5 % — ABNORMAL LOW (ref 39.0–52.0)
Hemoglobin: 11.1 g/dL — ABNORMAL LOW (ref 13.0–17.0)
Immature Granulocytes: 0 %
Lymphocytes Relative: 20 %
Lymphs Abs: 1.4 10*3/uL (ref 0.7–4.0)
MCH: 30.7 pg (ref 26.0–34.0)
MCHC: 32.2 g/dL (ref 30.0–36.0)
MCV: 95.6 fL (ref 80.0–100.0)
Monocytes Absolute: 0.3 10*3/uL (ref 0.1–1.0)
Monocytes Relative: 4 %
Neutro Abs: 5.2 10*3/uL (ref 1.7–7.7)
Neutrophils Relative %: 76 %
Platelets: 127 10*3/uL — ABNORMAL LOW (ref 150–400)
RBC: 3.61 MIL/uL — ABNORMAL LOW (ref 4.22–5.81)
RDW: 15.3 % (ref 11.5–15.5)
WBC: 7 10*3/uL (ref 4.0–10.5)
nRBC: 0 % (ref 0.0–0.2)

## 2022-06-26 LAB — LIPASE, BLOOD: Lipase: 26 U/L (ref 11–51)

## 2022-06-26 MED ORDER — ONDANSETRON HCL 4 MG/2ML IJ SOLN
4.0000 mg | Freq: Once | INTRAMUSCULAR | Status: AC
  Administered 2022-06-26: 4 mg via INTRAVENOUS

## 2022-06-26 MED ORDER — METRONIDAZOLE 500 MG PO TABS: 500.0000 mg | ORAL_TABLET | Freq: Three times a day (TID) | ORAL | 0 refills | Status: DC

## 2022-06-26 MED ORDER — SODIUM BICARBONATE 650 MG PO TABS
1300.0000 mg | ORAL_TABLET | Freq: Two times a day (BID) | ORAL | Status: DC
  Filled 2022-06-26 (×2): qty 2

## 2022-06-26 MED ORDER — ACETAMINOPHEN 650 MG RE SUPP: 650.0000 mg | Freq: Four times a day (QID) | RECTAL | Status: DC | PRN

## 2022-06-26 MED ORDER — ALBUTEROL SULFATE (2.5 MG/3ML) 0.083% IN NEBU: 2.5000 mg | INHALATION_SOLUTION | RESPIRATORY_TRACT | Status: DC | PRN

## 2022-06-26 MED ORDER — LOPERAMIDE HCL 2 MG PO CAPS
4.0000 mg | ORAL_CAPSULE | Freq: Once | ORAL | Status: AC
  Administered 2022-06-26: 4 mg via ORAL
  Filled 2022-06-26: qty 2

## 2022-06-26 MED ORDER — METOPROLOL SUCCINATE ER 25 MG PO TB24
25.0000 mg | ORAL_TABLET | Freq: Every day | ORAL | Status: DC
  Administered 2022-06-27 – 2022-06-29 (×3): 25 mg via ORAL
  Filled 2022-06-26 (×3): qty 1

## 2022-06-26 MED ORDER — ONDANSETRON HCL 4 MG/2ML IJ SOLN
4.0000 mg | Freq: Four times a day (QID) | INTRAMUSCULAR | Status: DC | PRN
  Administered 2022-06-26 – 2022-06-27 (×2): 4 mg via INTRAVENOUS
  Filled 2022-06-26 (×3): qty 2

## 2022-06-26 MED ORDER — ONDANSETRON HCL 4 MG/2ML IJ SOLN: 4.0000 mg | Freq: Once | INTRAMUSCULAR | Status: DC

## 2022-06-26 MED ORDER — ACETAMINOPHEN 325 MG PO TABS: 650.0000 mg | ORAL_TABLET | Freq: Four times a day (QID) | ORAL | Status: DC | PRN

## 2022-06-26 MED ORDER — ATORVASTATIN CALCIUM 20 MG PO TABS: 40.0000 mg | ORAL_TABLET | Freq: Every day | ORAL | Status: DC

## 2022-06-26 MED ORDER — CIPROFLOXACIN HCL 500 MG PO TABS: 500.0000 mg | ORAL_TABLET | Freq: Two times a day (BID) | ORAL | 0 refills | Status: DC

## 2022-06-26 MED ORDER — ISOSORBIDE MONONITRATE ER 30 MG PO TB24
30.0000 mg | ORAL_TABLET | Freq: Every day | ORAL | Status: DC
  Administered 2022-06-27 – 2022-06-29 (×3): 30 mg via ORAL
  Filled 2022-06-26 (×3): qty 1

## 2022-06-26 MED ORDER — SODIUM CHLORIDE 0.9 % IV BOLUS
1000.0000 mL | Freq: Once | INTRAVENOUS | Status: AC
  Administered 2022-06-26: 1000 mL via INTRAVENOUS

## 2022-06-26 MED ORDER — ONDANSETRON 8 MG PO TBDP: 8.0000 mg | ORAL_TABLET | Freq: Three times a day (TID) | ORAL | 0 refills | Status: DC | PRN

## 2022-06-26 MED ORDER — METRONIDAZOLE 500 MG/100ML IV SOLN
500.0000 mg | Freq: Once | INTRAVENOUS | Status: AC
  Administered 2022-06-26: 500 mg via INTRAVENOUS
  Filled 2022-06-26: qty 100

## 2022-06-26 MED ORDER — ASPIRIN 81 MG PO TBEC: 81.0000 mg | DELAYED_RELEASE_TABLET | Freq: Every day | ORAL | Status: DC

## 2022-06-26 MED ORDER — CIPROFLOXACIN IN D5W 400 MG/200ML IV SOLN
400.0000 mg | Freq: Once | INTRAVENOUS | Status: AC
  Administered 2022-06-26: 400 mg via INTRAVENOUS
  Filled 2022-06-26: qty 200

## 2022-06-26 MED ORDER — LACTATED RINGERS IV SOLN: INTRAVENOUS | Status: AC

## 2022-06-26 MED ORDER — METOCLOPRAMIDE HCL 5 MG/ML IJ SOLN
10.0000 mg | Freq: Once | INTRAMUSCULAR | Status: AC
  Administered 2022-06-26: 10 mg via INTRAVENOUS
  Filled 2022-06-26: qty 2

## 2022-06-26 MED ORDER — TAMSULOSIN HCL 0.4 MG PO CAPS: 0.4000 mg | ORAL_CAPSULE | Freq: Every day | ORAL | Status: DC

## 2022-06-26 MED ORDER — LEVOTHYROXINE SODIUM 100 MCG PO TABS
100.0000 ug | ORAL_TABLET | Freq: Every day | ORAL | Status: DC
  Administered 2022-06-29: 100 ug via ORAL
  Filled 2022-06-26 (×2): qty 1

## 2022-06-26 MED ORDER — APIXABAN 2.5 MG PO TABS
2.5000 mg | ORAL_TABLET | Freq: Two times a day (BID) | ORAL | Status: DC
  Administered 2022-06-27 – 2022-06-29 (×3): 2.5 mg via ORAL
  Filled 2022-06-26 (×5): qty 1

## 2022-06-26 MED ORDER — POLYETHYLENE GLYCOL 3350 17 G PO PACK: 17.0000 g | PACK | Freq: Every day | ORAL | Status: DC | PRN

## 2022-06-26 NOTE — ED Provider Notes (Signed)
[facility] Provider Note   Event Date/Time   First MD Initiated Contact with Patient 06/26/22 1645     (approximate) History  Emesis  HPI [name] is a 88 y.o. male who presents for vomiting, body aches, and diarrhea since yesterday.  Patient states that the symptoms have been worsening since onset.  Patient denies any recent travel or sick contacts. ROS: Patient currently denies any vision changes, tinnitus, difficulty speaking, facial droop, sore throat, chest pain, shortness of breath, abdominal pain, dysuria, or weakness/numbness/paresthesias in any extremity   Physical Exam  Triage Vital Signs: ED Triage Vitals  Enc Vitals Group     BP 06/26/22 1225 105/61     Pulse Rate 06/26/22 1225 73     Resp 06/26/22 1225 19     Temp 06/26/22 1225 98.1 F (36.7 C)     Temp Source 06/26/22 1225 Oral     SpO2 06/26/22 1225 98 %     Weight --      Height --      Head Circumference --      Peak Flow --      Pain Score 06/26/22 1235 0     Pain Loc --      Pain Edu? --      Excl. in [place]? --    Most recent vital signs: Vitals:   06/26/22 1844 06/26/22 1946  BP: (!) 123/54 136/66  Pulse: 79 73  Resp: 20 18  Temp:    SpO2: 96% 98%   General: Awake, oriented x4. CV:  Good peripheral perfusion.  Resp:  Normal effort.  Abd:  No distention.  Other:  Elderly Caucasian male laying in bed in no acute distress ED Results / Procedures / Treatments  Labs (all labs ordered are listed, but only abnormal results are displayed) Labs Reviewed  COMPREHENSIVE METABOLIC PANEL - Abnormal; Notable for the following components:      Result Value   BUN 39 (*)    Creatinine, Ser 3.39 (*)    Calcium 8.8 (*)    Total Protein 6.1 (*)    Albumin 3.2 (*)    Total Bilirubin 1.3 (*)    GFR, Estimated 16 (*)    All other components within normal limits  URINALYSIS, ROUTINE W REFLEX MICROSCOPIC - Abnormal; Notable for the following components:   Color, Urine YELLOW (*)     APPearance HAZY (*)    Hgb urine dipstick SMALL (*)    Ketones, ur 5 (*)    Protein, ur 30 (*)    All other components within normal limits  CBC WITH DIFFERENTIAL/PLATELET - Abnormal; Notable for the following components:   RBC 3.61 (*)    Hemoglobin 11.1 (*)    HCT 34.5 (*)    Platelets 127 (*)    All other components within normal limits  RESP PANEL BY RT-PCR (RSV, FLU A&B, COVID)  [id number]  GASTROINTESTINAL PANEL BY PCR, STOOL (REPLACES STOOL CULTURE)  LIPASE, BLOOD  BASIC METABOLIC PANEL  CBC   EKG ED ECG REPORT I, Naaman Plummer, the attending physician, personally viewed and interpreted this ECG. Date: 06/26/2022 EKG Time: 1249 Rate: 80 Rhythm: normal sinus rhythm QRS Axis: normal Intervals: Right bundle branch block with multiple PVCs ST/T Wave abnormalities: normal Narrative Interpretation: Normal sinus rhythm with right bundle branch block and multiple PVCs no evidence of acute ischemia RADIOLOGY ED MD interpretation: CT of the abdomen and pelvis without contrast interpreted independently by me shows evidence  of likely sigmoid colitis -Agree with radiology assessment Official radiology report(s): CT ABDOMEN PELVIS WO CONTRAST  Result Date: 06/26/2022 CLINICAL DATA:  Acute lower back pain.  Vomiting. EXAM: CT ABDOMEN AND PELVIS WITHOUT CONTRAST TECHNIQUE: Multidetector CT imaging of the abdomen and pelvis was performed following the standard protocol without IV contrast. RADIATION DOSE REDUCTION: This exam was performed according to the departmental dose-optimization program which includes automated exposure control, adjustment of the mA and/or kV according to patient size and/or use of iterative reconstruction technique. COMPARISON:  March 31, 2019. FINDINGS: Lower chest: Visualized lung bases are unremarkable. Large hiatal hernia is noted. Hepatobiliary: No focal liver abnormality is seen. No gallstones, gallbladder wall thickening, or biliary dilatation. Pancreas:  Unremarkable. No pancreatic ductal dilatation or surrounding inflammatory changes. Spleen: Normal in size without focal abnormality. Adrenals/Urinary Tract: Adrenal glands appear normal. There is continued presence of severe left renal cortical atrophy and severe left hydroureteronephrosis secondary to distal left ureteral calculi. This is unchanged compared to prior exam. Right kidney and ureter are unremarkable. Multiple small bladder diverticula are noted. Stomach/Bowel: There is no definite evidence of bowel obstruction. Extensive diverticulosis of descending and sigmoid colon is again noted. There is noted mild wall thickening of the sigmoid colon suggesting possible infectious or inflammatory colitis. Vascular/Lymphatic: Aortic atherosclerosis. No enlarged abdominal or pelvic lymph nodes. Reproductive: Prostate is unremarkable. Other: No abdominal wall hernia or abnormality. No abdominopelvic ascites. Musculoskeletal: Severe osteoarthritis is seen involving the left hip. No acute osseous abnormality is noted. IMPRESSION: Extensive diverticulosis is again noted involving the descending and sigmoid colon. There is now noted mild wall thickening of the sigmoid colon suggesting possible infectious or inflammatory colitis. Large sliding-type hiatal hernia. Stable appearance of severe left renal cortical atrophy with associated severe left hydroureteronephrosis due to multiple distal left ureteral calculi. Severe osteoarthritis of the left hip. Aortic Atherosclerosis (ICD10-I70.0). Electronically Signed   By: Marijo Conception M.D.   On: 06/26/2022 17:33   PROCEDURES: Critical Care performed: No .1-3 Lead EKG Interpretation  Performed by: Naaman Plummer, MD Authorized by: Naaman Plummer, MD     Interpretation: normal     ECG rate:  71   ECG rate assessment: normal     Rhythm: sinus rhythm     Ectopy: none     Conduction: normal   Comments:     Multiple PVCs  MEDICATIONS ORDERED IN ED: Medications   ondansetron (ZOFRAN) injection 4 mg (4 mg Intravenous Given 06/26/22 1847)  acetaminophen (TYLENOL) tablet 650 mg (has no administration in time range)    Or  acetaminophen (TYLENOL) suppository 650 mg (has no administration in time range)  polyethylene glycol (MIRALAX / GLYCOLAX) packet 17 g (has no administration in time range)  aspirin EC tablet 81 mg (has no administration in time range)  atorvastatin (LIPITOR) tablet 40 mg (has no administration in time range)  apixaban (ELIQUIS) tablet 2.5 mg (has no administration in time range)  isosorbide mononitrate (IMDUR) 24 hr tablet 30 mg (has no administration in time range)  albuterol (PROVENTIL) (2.5 MG/3ML) 0.083% nebulizer solution 2.5 mg (has no administration in time range)  levothyroxine (SYNTHROID) tablet 100 mcg (has no administration in time range)  metoprolol succinate (TOPROL-XL) 24 hr tablet 25 mg (has no administration in time range)  sodium bicarbonate tablet 1,300 mg (has no administration in time range)  tamsulosin (FLOMAX) capsule 0.4 mg (has no administration in time range)  lactated ringers infusion (has no administration in time range)  sodium chloride  0.9 % bolus 1,000 mL (0 mLs Intravenous Stopped 06/26/22 2328)  ciprofloxacin (CIPRO) IVPB 400 mg (0 mg Intravenous Stopped 06/26/22 2050)  metroNIDAZOLE (FLAGYL) IVPB 500 mg (0 mg Intravenous Stopped 06/26/22 2050)  loperamide (IMODIUM) capsule 4 mg (4 mg Oral Given 06/26/22 1946)  ondansetron (ZOFRAN) injection 4 mg (4 mg Intravenous Given 06/26/22 2003)  metoCLOPramide (REGLAN) injection 10 mg (10 mg Intravenous Given 06/26/22 2108)   IMPRESSION / MDM / ASSESSMENT AND PLAN / ED COURSE  I reviewed the triage vital signs and the nursing notes.                             The patient is on the cardiac monitor to evaluate for evidence of arrhythmia and/or significant heart rate changes. Patient's presentation is most consistent with acute presentation with potential  threat to life or bodily function. Patient has CT evidence colitis Patient has no peritoneal signs or signs of perforation. Patients symptoms not typical for other emergent causes of abdominal pain such as, but not limited to, appendicitis, abdominal aortic aneurysm, surgical biliary disease, acute coronary syndrome, etc.  Unfortunately despite multiple medications for emesis, patient has been p.o. intolerant and therefore will require admission to medicine for further evaluation and management.  Dispo admit to medicine   FINAL CLINICAL IMPRESSION(S) / ED DIAGNOSES   Final diagnoses:  Colitis  Dehydration  Diarrhea of presumed infectious origin   Rx / DC Orders   ED Discharge Orders          Ordered    ciprofloxacin (CIPRO) 500 MG tablet  2 times daily        06/26/22 2045    metroNIDAZOLE (FLAGYL) 500 MG tablet  3 times daily        06/26/22 2045    ondansetron (ZOFRAN-ODT) 8 MG disintegrating tablet  Every 8 hours PRN        06/26/22 2045           Note:  This document was prepared using Dragon voice recognition software and may include unintentional dictation errors.   Naaman Plummer, MD 06/26/22 [phone]

## 2022-06-26 NOTE — H&P (Signed)
History and Physical    Patient: [name] ZOX:[id number] DOB: 29-Aug-1926 DOA: 06/26/2022 DOS: the patient was seen and examined on 06/27/2022 PCP: Pcp, No  Patient coming from: Home  Chief Complaint:  Chief Complaint  Patient presents with   Emesis   HPI: [name] is a 88 y.o. male with medical history significant of recent CVA, CKD stage IV, atrial fibrillation on AC, prostate cancer, anemia of chronic disease, CAD, hypertension, hyperlipidemia, thrombocytopenia who presents to the ED due to nausea and vomiting.  [name] states that over the last 24 to 36 hours, he has developed nausea and vomiting and due to this, has been unable to tolerate any p.o. intake.  His emesis has been nonbloody.  He denies any abdominal pain or diarrhea.  His last bowel movement was in the a.m. of 12/19.  He denies any other symptoms at this time including fever, chills, chest pain, shortness of breath, palpitations.  He is having some left lower extremity pain.  He was recently admitted to [facility] for acute CVA, but was not treated with any antibiotics at the time.  ED course: On arrival to the ED, patient was normotensive at 105/61 with heart rate 73.  He was saturating at 98% on room air. Initial workup remarkable for creatinine of 3.39, BUN of 39, and GFR of 16.  WBC within normal limits.  CT abdomen/pelvis was obtained that demonstrated extensive diverticulosis with mild wall thickening of the sigmoid colon suggesting possible infectious or inflammatory colitis.  No other acute changes noted.  Due to patient's inability to tolerate p.o. intake in the ED even after multiple doses of Zofran and Reglan, [facility] contacted to admit.  Review of Systems: As mentioned in the history of present illness. All other systems reviewed and are negative.  Past Medical History:  Diagnosis Date   Anemia    Cardiac disease    CHF (congestive heart failure) (HCC)    Choledocholithiasis    Chronic kidney disease  (CKD), stage IV (severe) (HCC)    left   Elevated LFTs    Esophageal stenosis    Hiatal hernia 04/03/2019   large   History of blindness    History of diarrhea    History of gallstones    Hypercholesteremia    Hypertension    Hypothyroidism    Malnutrition (HCC)    Osteoporosis    Prostate cancer ([place])    Sepsis ([place])    Shingles    Shingles    Skin cancer    right cheek   Past Surgical History:  Procedure Laterality Date   CARDIAC CATHETERIZATION  04/13/2014   CT PERC CHOLECYSTOSTOMY  07/20/2016   Placed at [facility] Interventional Radiology   ENDOSCOPIC RETROGRADE CHOLANGIOPANCREATOGRAPHY (ERCP) WITH PROPOFOL N/A 04/06/2019   Procedure: ENDOSCOPIC RETROGRADE CHOLANGIOPANCREATOGRAPHY (ERCP) WITH PROPOFOL;  Surgeon: Lucilla Lame, MD;  Location: [facility] ENDOSCOPY;  Service: Endoscopy;  Laterality: N/A;   Social History:  reports that he quit smoking about 62 years ago. His smoking use included cigarettes and cigars. His smokeless tobacco use includes chew. He reports that he does not currently use alcohol. He reports that he does not use drugs.  Allergies  Allergen Reactions   Ativan [Lorazepam] Other (See Comments)    Hallucinations, combative   Penicillin G Other (See Comments)    Has patient had a PCN reaction causing immediate rash, facial/tongue/throat swelling, SOB or lightheadedness with hypotension: Yes Has patient had a PCN reaction causing severe rash involving mucus  membranes or skin necrosis: No Has patient had a PCN reaction that required hospitalization No Has patient had a PCN reaction occurring within the last 10 years: No If all of the above answers are "NO", then may proceed with Cephalosporin use.    Amlodipine Swelling   Lisinopril Other (See Comments)   Spironolactone Other (See Comments)   Tessalon [Benzonatate]     Family History  Problem Relation Age of Onset   CAD Mother    Cancer Mother    Heart attack Father     Prior to Admission medications    Medication Sig Start Date End Date Taking? Authorizing Provider  aspirin EC 81 MG tablet Take 81 mg by mouth at bedtime.   Yes [provider]  atorvastatin (LIPITOR) 40 MG tablet Take 40 mg by mouth at bedtime.    Yes [provider]  Cholecalciferol (VITAMIN D3) 5000 units TABS Take 5,000 Units by mouth daily.   Yes [provider]  ciprofloxacin (CIPRO) 500 MG tablet Take 1 tablet (500 mg total) by mouth 2 (two) times daily for 7 days. 06/26/22 07/03/22 Yes Naaman Plummer, MD  [employer] DIGESTIVE PROBIOTIC 250 MG capsule Take 250 mg by mouth 2 (two) times daily. 04/20/22  Yes [provider]  cyanocobalamin (,VITAMIN B-12,) 1000 MCG/ML injection Inject 1,000 mcg into the muscle every 30 (thirty) days. Every 15th of the month. 11/08/21  Yes [provider]  ELIQUIS 2.5 MG TABS tablet Take 2.5 mg by mouth 2 (two) times daily. 06/25/22  Yes [provider]  ferrous sulfate 325 (65 FE) MG tablet Take 325 mg by mouth 2 (two) times daily.    Yes [provider]  isosorbide mononitrate (IMDUR) 30 MG 24 hr tablet Take 1 tablet (30 mg total) by mouth daily. 03/11/18  Yes Bloomfield, Carley D, DO  levothyroxine (SYNTHROID) 100 MCG tablet Take 100 mcg by mouth daily before breakfast. 05/18/22  Yes [provider]  metoprolol succinate (TOPROL-XL) 25 MG 24 hr tablet Take 25 mg by mouth daily.   Yes [provider]  metroNIDAZOLE (FLAGYL) 500 MG tablet Take 1 tablet (500 mg total) by mouth 3 (three) times daily for 7 days. 06/26/22 07/03/22 Yes Bradler, Vista Lawman, MD  Omega-3 Fatty Acids (FISH OIL) 1200 MG CAPS Take 1,200 mg by mouth 2 (two) times daily.   Yes [provider]  ondansetron (ZOFRAN-ODT) 8 MG disintegrating tablet Take 1 tablet (8 mg total) by mouth every 8 (eight) hours as needed for nausea or vomiting. 06/26/22  Yes Bradler, Vista Lawman, MD  pantoprazole (PROTONIX) 40 MG tablet Take 40 mg by mouth daily.   Yes [provider]  potassium chloride (KLOR-CON) 10 MEQ tablet Take 10 mEq by mouth daily.   Yes [provider]  sodium bicarbonate 650 MG tablet Take 1,300 mg by mouth 2 (two) times daily.    Yes [provider]  tamsulosin (FLOMAX) 0.4 MG CAPS capsule Take 0.4 mg by mouth at bedtime.    Yes [provider]  torsemide (DEMADEX) 20 MG tablet Take 40 mg by mouth in the morning and at bedtime.  10/27/15  Yes [provider]  levalbuterol (XOPENEX) 1.25 MG/3ML nebulizer solution Take 1.25 mg by nebulization every 8 (eight) hours as needed.    [provider]  nitroGLYCERIN (NITROSTAT) 0.4 MG SL tablet Place 0.4 mg under the tongue every 5 (five) minutes as needed for chest pain. Patient not taking: Reported on 06/26/2022  [provider]    Physical Exam: Vitals:   06/26/22 1225 06/26/22 1844 06/26/22 1946  BP: 105/61 (!) 123/54 136/66  Pulse: 73 79 73  Resp: '19 20 18  '$ Temp: 98.1 F (36.7 C)    TempSrc: Oral    SpO2: 98% 96% 98%   Physical Exam Vitals and nursing note reviewed.  Constitutional:      General: He is not in acute distress.    Appearance: He is normal weight.  HENT:     Head: Normocephalic and atraumatic.  Eyes:     Conjunctiva/sclera: Conjunctivae normal.     Pupils: Pupils are equal, round, and reactive to light.  Cardiovascular:     Rate and Rhythm: Normal rate. Rhythm irregular.     Heart sounds: No murmur heard.    No gallop.  Pulmonary:     Effort: Pulmonary effort is normal. No respiratory distress.     Breath sounds: Normal breath sounds. No wheezing, rhonchi or rales.  Abdominal:     General: Bowel sounds are decreased. There is no distension.     Palpations: Abdomen is soft.     Tenderness: There is no abdominal tenderness. There is no guarding.  Musculoskeletal:     Cervical back: Neck supple.     Right lower leg: No edema.     Left lower leg: No edema.  Skin:    General: Skin is warm and dry.      Coloration: Skin is pale.  Neurological:     General: No focal deficit present.     Mental Status: He is alert. Mental status is at baseline.  Psychiatric:        Mood and Affect: Mood normal.        Behavior: Behavior normal.    Data Reviewed: CBC with WBC of 7.0, hemoglobin of 11.1, MCV of 95, platelets of 127.  BMP with potassium 3.6, bicarb of 22, BUN of 39, creatinine of 3.39, calcium of 8.8, total bilirubin of 1.3 and GFR of 16. Lipase within normal limits Urinalysis with small hemoglobin, mild ketonuria, proteinuria, and no bacteria.  COVID-19 PCR, influenza PCR and RSV PCR negative.  EKG personally reviewed.  Narrow complex regular rhythm, however unable to confirm sinus rhythm is unable to see P waves.  Frequent PVCs noted.  Right bundle branch block.  Compared to prior EKG, less PVCs previously noted.  CT ABDOMEN PELVIS WO CONTRAST  Result Date: 06/26/2022 CLINICAL DATA:  Acute lower back pain.  Vomiting. EXAM: CT ABDOMEN AND PELVIS WITHOUT CONTRAST TECHNIQUE: Multidetector CT imaging of the abdomen and pelvis was performed following the standard protocol without IV contrast. RADIATION DOSE REDUCTION: This exam was performed according to the departmental dose-optimization program which includes automated exposure control, adjustment of the mA and/or kV according to patient size and/or use of iterative reconstruction technique. COMPARISON:  March 31, 2019. FINDINGS: Lower chest: Visualized lung bases are unremarkable. Large hiatal hernia is noted. Hepatobiliary: No focal liver abnormality is seen. No gallstones, gallbladder wall thickening, or biliary dilatation. Pancreas: Unremarkable. No pancreatic ductal dilatation or surrounding inflammatory changes. Spleen: Normal in size without focal abnormality. Adrenals/Urinary Tract: Adrenal glands appear normal. There is continued presence of severe left renal cortical atrophy and severe left hydroureteronephrosis secondary to distal left  ureteral calculi. This is unchanged compared to prior exam. Right kidney and ureter are unremarkable. Multiple small bladder diverticula are noted. Stomach/Bowel: There is no definite evidence of bowel obstruction. Extensive diverticulosis of descending and sigmoid colon is  again noted. There is noted mild wall thickening of the sigmoid colon suggesting possible infectious or inflammatory colitis. Vascular/Lymphatic: Aortic atherosclerosis. No enlarged abdominal or pelvic lymph nodes. Reproductive: Prostate is unremarkable. Other: No abdominal wall hernia or abnormality. No abdominopelvic ascites. Musculoskeletal: Severe osteoarthritis is seen involving the left hip. No acute osseous abnormality is noted. IMPRESSION: Extensive diverticulosis is again noted involving the descending and sigmoid colon. There is now noted mild wall thickening of the sigmoid colon suggesting possible infectious or inflammatory colitis. Large sliding-type hiatal hernia. Stable appearance of severe left renal cortical atrophy with associated severe left hydroureteronephrosis due to multiple distal left ureteral calculi. Severe osteoarthritis of the left hip. Aortic Atherosclerosis (ICD10-I70.0). Electronically Signed   By: Marijo Conception M.D.   On: 06/26/2022 17:33    Results are pending, will review when available.  Assessment and Plan: * Intractable nausea and vomiting [name] is presenting with 24 to 36-hour history of intractable nausea with vomiting.  Differential includes viral illness versus atypical presentation of diverticulitis (given CT imaging with mild wall thickening of the sigmoid in the setting of extensive diverticulosis).  No evidence of bowel obstruction on imaging.  Despite receiving multiple doses of Zofran and one-time dose of Reglan, patient is still unable to maintain p.o. intake.  - Continue Zofran as needed - Trial of very low-dose Ativan for intractable vomiting given borderline QT prolongation.   Discussed with patient's daughter-in-law that we will try a much lower dose than he received prior (history of agitation with Ativan in the past) - IV fluids - Full liquid diet - No further antibiotics indicated at this time - GI viral panel  Frequent PVCs EKG with frequent PVCs.  Per chart review, patient has a history of similar findings previously.  Will monitor electrolytes and replete as necessary.  - Magnesium level pending - Daily BMP and magnesium while unable to tolerate p.o. intake and vomiting  Chronic kidney disease, stage IV (severe) ([place]) Renal function currently at baseline.  Will monitor closely given poor p.o. intake.  -Daily BMP while admitted  Chronic diastolic (congestive) heart failure ([place]) Patient appears euvolemic at this time.  Given poor p.o. intake and ongoing vomiting, will hold home torsemide at this time.  -Holding home torsemide  Benign essential HTN - Resume home antihypertensives when able  Advance Care Planning:   Code Status: Full Code (per patient discussion)  Consults: None  Family Communication: Patient's daughter-in-law updated at bedside  Severity of Illness: The appropriate patient status for this patient is OBSERVATION. Observation status is judged to be reasonable and necessary in order to provide the required intensity of service to ensure the patient's safety. The patient's presenting symptoms, physical exam findings, and initial radiographic and laboratory data in the context of their medical condition is felt to place them at decreased risk for further clinical deterioration. Furthermore, it is anticipated that the patient will be medically stable for discharge from the hospital within 2 midnights of admission.   Author: Jose Persia, MD 06/27/2022 12:23 AM  For on call review [email or web address].

## 2022-06-26 NOTE — ED Triage Notes (Signed)
Pt states he has been vomiting and body aches since yesterday. Pt states lower back pain as well, but only hurts when moving, but no chest pain. Family states pt has not been eating or drinking since yesterday.

## 2022-06-26 NOTE — ED Notes (Signed)
Pt vomited, an additional dose of zofran was given. IV abx still running at this time. Vitals stable.

## 2022-06-26 NOTE — ED Provider Triage Note (Signed)
Emergency Medicine Provider Triage Evaluation Note  CHUCK CABAN , a 88 y.o. male  was evaluated in triage.  Pt complains of vomiting, body aches since yesterday morning. No fever. No chest pain or abdominal pain. Reports low back pain. Hasn't had anything to eat or drink since yesterday morning. Slight cough  Review of Systems  Positive: Myalgias, vomiting, back pain Negative: Fever, chest pain  Physical Exam  BP 105/61 (BP Location: Right Arm)   Pulse 73   Temp 98.1 F (36.7 C) (Oral)   Resp 19   SpO2 98%  Gen:   Awake, no distress   Resp:  Normal effort  MSK:   Moves extremities without difficulty  Other:    Medical Decision Making  Medically screening exam initiated at 12:34 PM.  Appropriate orders placed.  TARELL SCHOLLMEYER was informed that the remainder of the evaluation will be completed by another provider, this initial triage assessment does not replace that evaluation, and the importance of remaining in the ED until their evaluation is complete.     Marquette Old, PA-C 06/26/22 1237

## 2022-06-27 ENCOUNTER — Inpatient Hospital Stay: Payer: Medicare Other

## 2022-06-27 ENCOUNTER — Encounter: Payer: Self-pay | Admitting: Internal Medicine

## 2022-06-27 DIAGNOSIS — R824 Acetonuria: Secondary | ICD-10-CM | POA: Diagnosis present

## 2022-06-27 DIAGNOSIS — R197 Diarrhea, unspecified: Secondary | ICD-10-CM | POA: Diagnosis present

## 2022-06-27 DIAGNOSIS — E039 Hypothyroidism, unspecified: Secondary | ICD-10-CM | POA: Diagnosis present

## 2022-06-27 DIAGNOSIS — Z888 Allergy status to other drugs, medicaments and biological substances status: Secondary | ICD-10-CM | POA: Diagnosis not present

## 2022-06-27 DIAGNOSIS — R112 Nausea with vomiting, unspecified: Secondary | ICD-10-CM | POA: Diagnosis not present

## 2022-06-27 DIAGNOSIS — Z20822 Contact with and (suspected) exposure to covid-19: Secondary | ICD-10-CM | POA: Diagnosis present

## 2022-06-27 DIAGNOSIS — E876 Hypokalemia: Secondary | ICD-10-CM | POA: Diagnosis present

## 2022-06-27 DIAGNOSIS — N184 Chronic kidney disease, stage 4 (severe): Secondary | ICD-10-CM | POA: Diagnosis present

## 2022-06-27 DIAGNOSIS — I2510 Atherosclerotic heart disease of native coronary artery without angina pectoris: Secondary | ICD-10-CM | POA: Diagnosis present

## 2022-06-27 DIAGNOSIS — Z7901 Long term (current) use of anticoagulants: Secondary | ICD-10-CM | POA: Diagnosis not present

## 2022-06-27 DIAGNOSIS — Z8546 Personal history of malignant neoplasm of prostate: Secondary | ICD-10-CM | POA: Diagnosis not present

## 2022-06-27 DIAGNOSIS — I10 Essential (primary) hypertension: Secondary | ICD-10-CM | POA: Diagnosis not present

## 2022-06-27 DIAGNOSIS — Z8249 Family history of ischemic heart disease and other diseases of the circulatory system: Secondary | ICD-10-CM | POA: Diagnosis not present

## 2022-06-27 DIAGNOSIS — Z7982 Long term (current) use of aspirin: Secondary | ICD-10-CM | POA: Diagnosis not present

## 2022-06-27 DIAGNOSIS — Z9049 Acquired absence of other specified parts of digestive tract: Secondary | ICD-10-CM | POA: Diagnosis not present

## 2022-06-27 DIAGNOSIS — J690 Pneumonitis due to inhalation of food and vomit: Secondary | ICD-10-CM | POA: Diagnosis present

## 2022-06-27 DIAGNOSIS — I493 Ventricular premature depolarization: Secondary | ICD-10-CM | POA: Diagnosis present

## 2022-06-27 DIAGNOSIS — M810 Age-related osteoporosis without current pathological fracture: Secondary | ICD-10-CM | POA: Diagnosis present

## 2022-06-27 DIAGNOSIS — Z79890 Hormone replacement therapy (postmenopausal): Secondary | ICD-10-CM | POA: Diagnosis not present

## 2022-06-27 DIAGNOSIS — I129 Hypertensive chronic kidney disease with stage 1 through stage 4 chronic kidney disease, or unspecified chronic kidney disease: Secondary | ICD-10-CM | POA: Diagnosis present

## 2022-06-27 DIAGNOSIS — Z85828 Personal history of other malignant neoplasm of skin: Secondary | ICD-10-CM | POA: Diagnosis not present

## 2022-06-27 DIAGNOSIS — Z880 Allergy status to penicillin: Secondary | ICD-10-CM | POA: Diagnosis not present

## 2022-06-27 DIAGNOSIS — A09 Infectious gastroenteritis and colitis, unspecified: Secondary | ICD-10-CM | POA: Diagnosis present

## 2022-06-27 DIAGNOSIS — E7800 Pure hypercholesterolemia, unspecified: Secondary | ICD-10-CM | POA: Diagnosis present

## 2022-06-27 DIAGNOSIS — K5730 Diverticulosis of large intestine without perforation or abscess without bleeding: Secondary | ICD-10-CM | POA: Diagnosis present

## 2022-06-27 DIAGNOSIS — Z87891 Personal history of nicotine dependence: Secondary | ICD-10-CM | POA: Diagnosis not present

## 2022-06-27 DIAGNOSIS — E860 Dehydration: Secondary | ICD-10-CM | POA: Diagnosis present

## 2022-06-27 LAB — CBC
HCT: 31.2 % — ABNORMAL LOW (ref 39.0–52.0)
Hemoglobin: 9.7 g/dL — ABNORMAL LOW (ref 13.0–17.0)
MCH: 30.5 pg (ref 26.0–34.0)
MCHC: 31.1 g/dL (ref 30.0–36.0)
MCV: 98.1 fL (ref 80.0–100.0)
Platelets: 101 10*3/uL — ABNORMAL LOW (ref 150–400)
RBC: 3.18 MIL/uL — ABNORMAL LOW (ref 4.22–5.81)
RDW: 15.1 % (ref 11.5–15.5)
WBC: 5.2 10*3/uL (ref 4.0–10.5)
nRBC: 0 % (ref 0.0–0.2)

## 2022-06-27 LAB — BASIC METABOLIC PANEL
Anion gap: 6 (ref 5–15)
BUN: 36 mg/dL — ABNORMAL HIGH (ref 8–23)
CO2: 20 mmol/L — ABNORMAL LOW (ref 22–32)
Calcium: 8.2 mg/dL — ABNORMAL LOW (ref 8.9–10.3)
Chloride: 115 mmol/L — ABNORMAL HIGH (ref 98–111)
Creatinine, Ser: 3.01 mg/dL — ABNORMAL HIGH (ref 0.61–1.24)
GFR, Estimated: 19 mL/min — ABNORMAL LOW (ref >60)
Glucose, Bld: 72 mg/dL (ref 70–99)
Potassium: 3.2 mmol/L — ABNORMAL LOW (ref 3.5–5.1)
Sodium: 141 mmol/L (ref 135–145)

## 2022-06-27 LAB — LACTIC ACID, PLASMA: Lactic Acid, Venous: 1 mmol/L (ref 0.5–1.9)

## 2022-06-27 LAB — MAGNESIUM
Magnesium: 2 mg/dL (ref 1.7–2.4)
Magnesium: 2 mg/dL (ref 1.7–2.4)

## 2022-06-27 LAB — CK: Total CK: 330 U/L (ref 49–397)

## 2022-06-27 MED ORDER — PANTOPRAZOLE SODIUM 40 MG IV SOLR
40.0000 mg | Freq: Two times a day (BID) | INTRAVENOUS | Status: DC
  Administered 2022-06-27 – 2022-06-29 (×5): 40 mg via INTRAVENOUS
  Filled 2022-06-27 (×5): qty 10

## 2022-06-27 MED ORDER — SODIUM CHLORIDE 0.9 % IV SOLN
1.0000 g | INTRAVENOUS | Status: DC
  Administered 2022-06-27 – 2022-06-28 (×2): 1 g via INTRAVENOUS
  Filled 2022-06-27: qty 1
  Filled 2022-06-27: qty 10

## 2022-06-27 MED ORDER — METRONIDAZOLE 500 MG/100ML IV SOLN
500.0000 mg | Freq: Three times a day (TID) | INTRAVENOUS | Status: DC
  Administered 2022-06-28 – 2022-06-29 (×5): 500 mg via INTRAVENOUS
  Filled 2022-06-27 (×6): qty 100

## 2022-06-27 MED ORDER — METOPROLOL TARTRATE 5 MG/5ML IV SOLN: 5.0000 mg | Freq: Four times a day (QID) | INTRAVENOUS | Status: DC | PRN

## 2022-06-27 MED ORDER — POTASSIUM CHLORIDE 20 MEQ PO PACK: 40.0000 meq | PACK | Freq: Once | ORAL | Status: DC

## 2022-06-27 MED ORDER — SODIUM CHLORIDE 0.9 % IV SOLN: INTRAVENOUS | Status: DC | PRN

## 2022-06-27 MED ORDER — POTASSIUM CHLORIDE 10 MEQ/100ML IV SOLN
10.0000 meq | INTRAVENOUS | Status: AC
  Administered 2022-06-27 (×4): 10 meq via INTRAVENOUS
  Filled 2022-06-27 (×4): qty 100

## 2022-06-27 MED ORDER — POTASSIUM CHLORIDE 10 MEQ/100ML IV SOLN
10.0000 meq | INTRAVENOUS | Status: AC
  Administered 2022-06-27 (×3): 10 meq via INTRAVENOUS
  Filled 2022-06-27 (×3): qty 100

## 2022-06-27 MED ORDER — LORAZEPAM 2 MG/ML IJ SOLN
0.2500 mg | Freq: Once | INTRAMUSCULAR | Status: AC | PRN
  Administered 2022-06-27: 0.25 mg via INTRAVENOUS
  Filled 2022-06-27: qty 1

## 2022-06-27 MED ORDER — SODIUM CHLORIDE 0.9 % IV SOLN
12.5000 mg | Freq: Four times a day (QID) | INTRAVENOUS | Status: DC | PRN
  Administered 2022-06-27: 12.5 mg via INTRAVENOUS
  Filled 2022-06-27: qty 0.5

## 2022-06-27 MED ORDER — PROCHLORPERAZINE EDISYLATE 10 MG/2ML IJ SOLN: 5.0000 mg | Freq: Four times a day (QID) | INTRAMUSCULAR | Status: DC | PRN

## 2022-06-27 MED ORDER — LACTATED RINGERS IV SOLN: INTRAVENOUS | Status: DC

## 2022-06-27 NOTE — ED Notes (Signed)
Patient requesting water, this RN gave patient sips of water to see if he could tolerate but patient immediately vomited back up. Patient continues to not tolerate PO intake at this time.

## 2022-06-27 NOTE — Assessment & Plan Note (Addendum)
EKG with frequent PVCs.  Per chart review, patient has a history of similar findings previously.  Will monitor electrolytes and replete as necessary.  Potassium at 3.2 with normal magnesium. -Replete electrolytes as needed - Daily BMP and magnesium while unable to tolerate p.o. intake and vomiting

## 2022-06-27 NOTE — ED Notes (Signed)
Administration of the pt's PO medications delayed due to the pt not being able to tolerate PO intake. The pt has been given Ativan IV to assist with his recurrent symptoms. The pt has been given his medication at this time, close monitoring continued, will reassess the pt's tolerance for PO intake.

## 2022-06-27 NOTE — Assessment & Plan Note (Addendum)
Patient appears euvolemic at this time.  Given poor p.o. intake and ongoing vomiting, will hold home torsemide at this time. -Holding home torsemide

## 2022-06-27 NOTE — Hospital Course (Addendum)
Taken from H&P.  [name] is a 88 y.o. male with medical history significant of recent CVA, CKD stage IV, atrial fibrillation on AC, prostate cancer, anemia of chronic disease, CAD, hypertension, hyperlipidemia, thrombocytopenia who presents to the ED due to nausea and vomiting, for the past 1 to 2 days.  Unable to tolerate any p.o. intake.  No abdominal pain or diarrhea.  No fever, chills, chest pain or shortness of breath.  ED course: On arrival to the ED, patient was normotensive at 105/61 with heart rate 73.  He was saturating at 98% on room air. Initial workup remarkable for creatinine of 3.39, BUN of 39, and GFR of 16.  WBC within normal limits.  Lipase within normal limit.  UA with small hemoglobin, mild ketonuria and protein urea.  COVID-19, influenza and RSV PCR negative.  CT abdomen/pelvis was obtained that demonstrated extensive diverticulosis with mild wall thickening of the sigmoid colon suggesting possible infectious or inflammatory colitis.  No other acute changes noted.  Due to patient's inability to tolerate p.o. intake in the ED even after multiple doses of Zofran and Reglan, [facility] contacted to admit.  12/20: Hemodynamically stable.  Labs pertinent for hemoglobin decreased to 11>>9.7, but all cell lines decreased so most likely some dilutional effect.  Potassium 3.2, magnesium 2, bicarb 20, some improvement to creatinine at 3.01 with GFR improved to 19 from 16 which is at his baseline. Patient continued to have vomiting and unable to take any p.o. intake despite getting Zofran and Phenergan, denies any pain or diarrhea.  12/21: Overnight nurse noted some coarseness at lungs, stat chest x-ray with mild multifocal patchy opacities, suspicious for pneumonia.  Procalcitonin at 0.13, no leukocytosis.  Blood cultures remain negative in 12 hours.  No lactic acidosis and CK within normal limit. He was started on ceftriaxone and Flagyl for concern of aspiration. This morning vital stable.   Slight decrease in bicarb to 17-increasing the home bicarb dose to 3 times daily.  Renal functions with slight improvement in GFR to 20. Speech evaluation was incomplete as patient was refusing any p.o. intake at that time.  He was able to take some ice cream with nursing staff.  Later there was worsening cough with any p.o. intake.  Ask speech to reevaluate.  12/22: Patient with significant improvement.  Able to tolerate diet.  No more nausea and vomiting.  Patient would like to go home.  He is being discharged on cefdinir and Flagyl for 3 more days for concern of aspiration pneumonia. Discussed with Santiago Glad and she would like to get maximum home health services which were ordered.  Patient will continue with rest of his home medications and follow-up with his providers for further recommendations.  Patient will be high risk for deterioration and mortality based on advanced age and underlying comorbidities.

## 2022-06-27 NOTE — ED Notes (Signed)
Patient trying to get up and leave hospital, says he does not want to stay, this RN finding his O2 probe and BP cuff off of him and purewick disconnected. Patient states he does not want to wear O2 monitor or BP cuff, this RN disconnected and keeping EKG monitoring connected to patient. Patient assisted to get comfortable in chair and informed he is unable to leave at this time.

## 2022-06-27 NOTE — Assessment & Plan Note (Signed)
-   Resume home antihypertensives when able

## 2022-06-27 NOTE — ED Notes (Addendum)
Attempted PO intake with drink of water post phenergan infusion, patient still vomiting up water immediately after drinking. MD Amin notified.

## 2022-06-27 NOTE — Progress Notes (Signed)
       CROSS COVER NOTE  NAME: ANTIONIO NEGRON MRN: [id number] DOB : 1926/09/25   HPI/Events of Note   Patient admitted with intractable nausea and vomiting now with courseness in lungs bilaterally  Assessment and  Interventions   Assessment: CKD IV present on admission with hgb without red cells on UA.  Chest xray ordered stat viewed by me and agree with reported findings Mild multifocal patchy opacities, suspicious for pneumonia, possibly on the basis of aspiration given the clinical history.    Plan:  Stat procal, ck, lactic and blood cultures Started rocephin and flagyl      Kathlene Cote NP [facility]

## 2022-06-27 NOTE — ED Notes (Addendum)
Patient took a few drinks of water this morning and immediately vomited water back up. Patient requesting to leave hospital this morning and this RN reminded him that he is still vomiting and that MD needs to see him this morning. Patient assisted into recliner chair and given IV zofran at this time. MD notified.

## 2022-06-27 NOTE — Assessment & Plan Note (Signed)
Potassium at 3.2 with normal magnesium. -Replace potassium and monitor

## 2022-06-27 NOTE — Progress Notes (Signed)
Progress Note   Patient: [name] DDU:[id number] DOB: 14-Jun-1927 DOA: 06/26/2022     0 DOS: the patient was seen and examined on 06/27/2022   Brief hospital course: Taken from H&P.  [name] is a 88 y.o. male with medical history significant of recent CVA, CKD stage IV, atrial fibrillation on AC, prostate cancer, anemia of chronic disease, CAD, hypertension, hyperlipidemia, thrombocytopenia who presents to the ED due to nausea and vomiting, for the past 1 to 2 days.  Unable to tolerate any p.o. intake.  No abdominal pain or diarrhea.  No fever, chills, chest pain or shortness of breath.  ED course: On arrival to the ED, patient was normotensive at 105/61 with heart rate 73.  He was saturating at 98% on room air. Initial workup remarkable for creatinine of 3.39, BUN of 39, and GFR of 16.  WBC within normal limits.  Lipase within normal limit.  UA with small hemoglobin, mild ketonuria and protein urea.  COVID-19, influenza and RSV PCR negative.  CT abdomen/pelvis was obtained that demonstrated extensive diverticulosis with mild wall thickening of the sigmoid colon suggesting possible infectious or inflammatory colitis.  No other acute changes noted.  Due to patient's inability to tolerate p.o. intake in the ED even after multiple doses of Zofran and Reglan, [facility] contacted to admit.  12/20: Hemodynamically stable.  Labs pertinent for hemoglobin decreased to 11>>9.7, but all cell lines decreased so most likely some dilutional effect.  Potassium 3.2, magnesium 2, bicarb 20, some improvement to creatinine at 3.01 with GFR improved to 19 from 16 which is at his baseline. Patient continued to have vomiting and unable to take any p.o. intake despite getting Zofran and Phenergan, denies any pain or diarrhea.  Assessment and Plan: * Intractable nausea and vomiting  Differential includes viral illness versus atypical presentation of diverticulitis (given CT imaging with mild wall thickening of the  sigmoid in the setting of extensive diverticulosis).  No evidence of bowel obstruction on imaging.  Despite receiving multiple doses of Zofran and one-time dose of Reglan, patient is still unable to maintain p.o. intake. - Continue Zofran and Phenergan as needed - IV fluids - Full liquid diet-if tolerating - No further antibiotics indicated at this time - GI viral panel-no diarrhea or BM at this time  Frequent PVCs EKG with frequent PVCs.  Per chart review, patient has a history of similar findings previously.  Will monitor electrolytes and replete as necessary.  Potassium at 3.2 with normal magnesium. -Replete electrolytes as needed - Daily BMP and magnesium while unable to tolerate p.o. intake and vomiting  Chronic kidney disease, stage IV (severe) ([place]) Renal function currently at baseline.  Will monitor closely given poor p.o. intake. -Daily BMP while admitted  Chronic diastolic (congestive) heart failure ([place]) Patient appears euvolemic at this time.  Given poor p.o. intake and ongoing vomiting, will hold home torsemide at this time. -Holding home torsemide  Benign essential HTN - Resume home antihypertensives when able  Hypokalemia Potassium at 3.2 with normal magnesium. -Replace potassium and monitor   Subjective: Patient was sitting in chair when seen today.  He wants to go home.  Continue to vomiting even after taking sips of water.  Still unable to tolerate any p.o. intake.  Having some epigastric pain, denies diarrhea.  Physical Exam: Vitals:   06/27/22 0800 06/27/22 1109 06/27/22 1110 06/27/22 1200  BP: (!) 102/54   (!) 105/53  Pulse: 72  83 85  Resp: 20  16 (!)  26  Temp:  99 F (37.2 C)    TempSrc:  Oral    SpO2: 100%  94% 94%   General.  Frail elderly man, in no acute distress. Pulmonary.  Lungs clear bilaterally, normal respiratory effort. CV.  Regular rate and rhythm, no JVD, rub or murmur. Abdomen.  Soft, nontender, nondistended, BS positive. CNS.  Alert  and oriented .  No focal neurologic deficit. Extremities.  No edema, no cyanosis, pulses intact and symmetrical. Psychiatry.  Judgment and insight appears normal.   Data Reviewed: Prior data reviewed.  Family Communication: Discussed with grandson on phone.  Disposition: Status is: Observation The patient will require care spanning > 2 midnights and should be moved to inpatient because: Still unable to take any p.o.  Planned Discharge Destination: Home  DVT prophylaxis.  Eliquis Time spent: 50 minutes  This record has been created using Systems analyst. Errors have been sought and corrected,but may not always be located. Such creation errors do not reflect on the standard of care.   Author: Lorella Nimrod, MD 06/27/2022 1:56 PM  For on call review [email or web address].

## 2022-06-27 NOTE — Assessment & Plan Note (Addendum)
Renal function currently at baseline.  Will monitor closely given poor p.o. intake. -Daily BMP while admitted

## 2022-06-27 NOTE — Assessment & Plan Note (Addendum)
Differential includes viral illness versus atypical presentation of diverticulitis (given CT imaging with mild wall thickening of the sigmoid in the setting of extensive diverticulosis).  No evidence of bowel obstruction on imaging.  Despite receiving multiple doses of Zofran and one-time dose of Reglan, patient is still unable to maintain p.o. intake. - Continue Zofran and Phenergan as needed - IV fluids - Full liquid diet-if tolerating - No further antibiotics indicated at this time - GI viral panel-no diarrhea or BM at this time

## 2022-06-28 DIAGNOSIS — R112 Nausea with vomiting, unspecified: Secondary | ICD-10-CM | POA: Diagnosis not present

## 2022-06-28 DIAGNOSIS — J690 Pneumonitis due to inhalation of food and vomit: Secondary | ICD-10-CM

## 2022-06-28 LAB — CBC
HCT: 28.8 % — ABNORMAL LOW (ref 39.0–52.0)
Hemoglobin: 9.1 g/dL — ABNORMAL LOW (ref 13.0–17.0)
MCH: 30.6 pg (ref 26.0–34.0)
MCHC: 31.6 g/dL (ref 30.0–36.0)
MCV: 97 fL (ref 80.0–100.0)
Platelets: 106 10*3/uL — ABNORMAL LOW (ref 150–400)
RBC: 2.97 MIL/uL — ABNORMAL LOW (ref 4.22–5.81)
RDW: 15.3 % (ref 11.5–15.5)
WBC: 5.8 10*3/uL (ref 4.0–10.5)
nRBC: 0 % (ref 0.0–0.2)

## 2022-06-28 LAB — GLUCOSE, CAPILLARY
Glucose-Capillary: 61 mg/dL — ABNORMAL LOW (ref 70–99)
Glucose-Capillary: 131 mg/dL — ABNORMAL HIGH (ref 70–99)

## 2022-06-28 LAB — BASIC METABOLIC PANEL
Anion gap: 10 (ref 5–15)
BUN: 33 mg/dL — ABNORMAL HIGH (ref 8–23)
CO2: 17 mmol/L — ABNORMAL LOW (ref 22–32)
Calcium: 8.3 mg/dL — ABNORMAL LOW (ref 8.9–10.3)
Chloride: 115 mmol/L — ABNORMAL HIGH (ref 98–111)
Creatinine, Ser: 2.85 mg/dL — ABNORMAL HIGH (ref 0.61–1.24)
GFR, Estimated: 20 mL/min — ABNORMAL LOW (ref >60)
Glucose, Bld: 71 mg/dL (ref 70–99)
Potassium: 4.5 mmol/L (ref 3.5–5.1)
Sodium: 142 mmol/L (ref 135–145)

## 2022-06-28 LAB — PROCALCITONIN
Procalcitonin: 0.13 ng/mL
Procalcitonin: 0.13 ng/mL

## 2022-06-28 LAB — MAGNESIUM: Magnesium: 1.8 mg/dL (ref 1.7–2.4)

## 2022-06-28 MED ORDER — ADULT MULTIVITAMIN W/MINERALS CH
1.0000 | ORAL_TABLET | Freq: Every day | ORAL | Status: DC
  Administered 2022-06-29: 1 via ORAL
  Filled 2022-06-28 (×2): qty 1

## 2022-06-28 MED ORDER — SODIUM BICARBONATE 650 MG PO TABS
1300.0000 mg | ORAL_TABLET | Freq: Three times a day (TID) | ORAL | Status: DC
  Administered 2022-06-28 – 2022-06-29 (×2): 1300 mg via ORAL
  Filled 2022-06-28 (×3): qty 2

## 2022-06-28 MED ORDER — BOOST / RESOURCE BREEZE PO LIQD CUSTOM
1.0000 | Freq: Three times a day (TID) | ORAL | Status: DC
  Administered 2022-06-29: 1 via ORAL

## 2022-06-28 MED ORDER — DEXTROSE 50 % IV SOLN
12.5000 g | INTRAVENOUS | Status: AC
  Administered 2022-06-28: 12.5 g via INTRAVENOUS
  Filled 2022-06-28: qty 50

## 2022-06-28 NOTE — Assessment & Plan Note (Signed)
Resolved

## 2022-06-28 NOTE — Progress Notes (Signed)
Progress Note   Patient: [name] ZOX:[id number] DOB: [birth year] DOA: 06/26/2022     1 DOS: the patient was seen and examined on 06/28/2022   Brief hospital course: Taken from H&P.  [name] is a 88 y.o. male with medical history significant of recent CVA, CKD stage IV, atrial fibrillation on AC, prostate cancer, anemia of chronic disease, CAD, hypertension, hyperlipidemia, thrombocytopenia who presents to the ED due to nausea and vomiting, for the past 1 to 2 days.  Unable to tolerate any p.o. intake.  No abdominal pain or diarrhea.  No fever, chills, chest pain or shortness of breath.  ED course: On arrival to the ED, patient was normotensive at 105/61 with heart rate 73.  He was saturating at 98% on room air. Initial workup remarkable for creatinine of 3.39, BUN of 39, and GFR of 16.  WBC within normal limits.  Lipase within normal limit.  UA with small hemoglobin, mild ketonuria and protein urea.  COVID-19, influenza and RSV PCR negative.  CT abdomen/pelvis was obtained that demonstrated extensive diverticulosis with mild wall thickening of the sigmoid colon suggesting possible infectious or inflammatory colitis.  No other acute changes noted.  Due to patient's inability to tolerate p.o. intake in the ED even after multiple doses of Zofran and Reglan, [facility] contacted to admit.  12/20: Hemodynamically stable.  Labs pertinent for hemoglobin decreased to 11>>9.7, but all cell lines decreased so most likely some dilutional effect.  Potassium 3.2, magnesium 2, bicarb 20, some improvement to creatinine at 3.01 with GFR improved to 19 from 16 which is at his baseline. Patient continued to have vomiting and unable to take any p.o. intake despite getting Zofran and Phenergan, denies any pain or diarrhea.  12/21: Overnight nurse noted some coarseness at lungs, stat chest x-ray with mild multifocal patchy opacities, suspicious for pneumonia.  Procalcitonin at 0.13, no leukocytosis.  Blood  cultures remain negative in 12 hours.  No lactic acidosis and CK within normal limit. He was started on ceftriaxone and Flagyl for concern of aspiration. This morning vital stable.  Slight decrease in bicarb to 17-increasing the home bicarb dose to 3 times daily.  Renal functions with slight improvement in GFR to 20. Speech evaluation was incomplete as patient was refusing any p.o. intake at that time.  He was able to take some ice cream with nursing staff.  Later there was worsening cough with any p.o. intake.  Ask speech to reevaluate.  Assessment and Plan: * Intractable nausea and vomiting Improved.  Differential includes viral illness versus atypical presentation of diverticulitis (given CT imaging with mild wall thickening of the sigmoid in the setting of extensive diverticulosis).  No evidence of bowel obstruction on imaging.  Despite receiving multiple doses of Zofran and one-time dose of Reglan, patient is still unable to maintain p.o. intake. - Continue Zofran and Phenergan as needed - IV fluids - Full liquid diet-if tolerating - GI viral panel-no diarrhea or BM at this time  Aspiration pneumonia (HCC) Concern of aspiration pneumonia on imaging and with coughing while taking p.o. -Swallow evaluation -Continue with ceftriaxone and Flagyl  Frequent PVCs EKG with frequent PVCs.  Per chart review, patient has a history of similar findings previously.  Will monitor electrolytes and replete as necessary.  Potassium at 3.2 with normal magnesium. -Replete electrolytes as needed - Daily BMP and magnesium while unable to tolerate p.o. intake and vomiting  Chronic kidney disease, stage IV (severe) ([place]) Renal function currently at baseline.  Will monitor closely given poor p.o. intake. -Daily BMP while admitted  Chronic diastolic (congestive) heart failure ([place]) Patient appears euvolemic at this time.  Given poor p.o. intake and ongoing vomiting, will hold home torsemide at this  time. -Holding home torsemide  Benign essential HTN - Resume home antihypertensives when able  Hypokalemia Resolved.   Subjective: Patient denies any more nausea or vomiting.  Continue to have some cough with mucus.  Physical Exam: Vitals:   06/27/22 1954 06/28/22 0413 06/28/22 0742 06/28/22 1531  BP: 111/60 (!) 113/44 135/60 136/67  Pulse: 76 69 73 77  Resp: '20 18 18 17  '$ Temp: 99.3 F (37.4 C) 98.6 F (37 C) 98.2 F (36.8 C) 97.9 F (36.6 C)  TempSrc:  Oral Oral Oral  SpO2: 93% 99% 100% 99%   General.  Frail elderly man, in no acute distress. Pulmonary.  Some scattered rhonchi bilaterally, normal respiratory effort. CV.  Regular rate and rhythm, no JVD, rub or murmur. Abdomen.  Soft, nontender, nondistended, BS positive. CNS.  Alert and oriented to self.  No focal neurologic deficit. Extremities.  No edema, no cyanosis, pulses intact and symmetrical. Psychiatry.  Judgment and insight appears impaired.   Data Reviewed: Prior data reviewed.  Family Communication: Discussed with grandson on phone.  Disposition: Status is: Inpatient The patient will require care spanning > 2 midnights and should be moved to inpatient because: Still unable to take any p.o.  Planned Discharge Destination: Home  DVT prophylaxis.  Eliquis Time spent: 45 minutes  This record has been created using Systems analyst. Errors have been sought and corrected,but may not always be located. Such creation errors do not reflect on the standard of care.   Author: Lorella Nimrod, MD 06/28/2022 5:57 PM  For on call review [email or web address].

## 2022-06-28 NOTE — Progress Notes (Signed)
SLP Cancellation Note  Patient Details Name: [name] MRN: [id number] DOB: 1926/10/12   Cancelled treatment:       Reason Eval/Treat Not Completed: Patient declined, no reason specified;Medical issues which prohibited therapy   Per RN, pt tolerating meds with puree and sips of thin liquids. Attempted clinical swallowing evaluation; however, pt declined due to nausea. Noted marked improvement in respirations since pt suctioned by RN this AM. Pt no longer with wet, gurgly respirations. SLP to continue efforts as appropriate.   Cherrie Gauze, M.S., [facility] [phone] [name])  Quintella Baton 06/28/2022, 11:53 AM

## 2022-06-28 NOTE — Progress Notes (Signed)
Notified NP Randol Kern of audible crackles in both lungs. Patient has tan phlegm. New orders placed for x-ray, labs and antibiotics. Held all oral medications and placed a speech evaluation. Will continue to monitor and assess.

## 2022-06-28 NOTE — Progress Notes (Signed)
Initial Nutrition Assessment  DOCUMENTATION CODES:   Non-severe (moderate) malnutrition in context of chronic illness  INTERVENTION:   -Boost Breeze po TID, each supplement provides 250 kcal and 9 grams of protein  -MVI with minerals daily -Magic cup TID with meals, each supplement provides 290 kcal and 9 grams of protein  -Obtain new wt  NUTRITION DIAGNOSIS:   Moderate Malnutrition related to chronic illness (prostate cancer) as evidenced by mild fat depletion, moderate fat depletion, mild muscle depletion, moderate muscle depletion.  GOAL:   Patient will meet greater than or equal to 90% of their needs  MONITOR:   PO intake, Supplement acceptance, Diet advancement  REASON FOR ASSESSMENT:   Malnutrition Screening Tool    ASSESSMENT:   Pt with medical history significant of recent CVA, CKD stage IV, atrial fibrillation on AC, prostate cancer, anemia of chronic disease, CAD, hypertension, hyperlipidemia, thrombocytopenia who presents due to nausea and vomiting  Pt admitted with intractable nausea and vomiting.   Reviewed I/O's: +1.6 L x 24 hours  UOP: 500 ml x 24 hours   Per SLP, plan to continue with full liquid diet.   Spoke with pt at bedside, who demonstrated some confusion at visit. Unable to get much history from pt. He reports feeling poorly and states that everything he tries to eat "come right back up". Per pt, this has been happening for about 4-5 days PTA. Prior to acute illness, pt reports he usually consumed 2 meals per day, but unable to provide diet recall.   When asked if he has lost weight, he replied "you know I have", but unable to provide further details. Reviewed wt hx; noted mild wt loss over the past year. Last recorded wt was on 06/13/22. RD will order new wt to better assess weight changes.   Discussed importance of good meal and supplement intake to promote healing.   Medications reviewed and include lactated ringers infusion @ 75 ml/hr.   Lab  Results  Component Value Date   HGBA1C 5.2 06/13/2022   PTA DM medications are none.   Labs reviewed: CBGS: 146 (inpatient orders for glycemic control are none).    NUTRITION - FOCUSED PHYSICAL EXAM:  Flowsheet Row Most Recent Value  Orbital Region Mild depletion  Upper Arm Region Moderate depletion  Thoracic and Lumbar Region Mild depletion  Buccal Region Moderate depletion  Temple Region Moderate depletion  Clavicle Bone Region Moderate depletion  Clavicle and Acromion Bone Region Moderate depletion  Scapular Bone Region Moderate depletion  Dorsal Hand Moderate depletion  Patellar Region Moderate depletion  Anterior Thigh Region Moderate depletion  Posterior Calf Region Moderate depletion  Edema (RD Assessment) None  Hair Reviewed  Eyes Reviewed  Mouth Reviewed  Skin Reviewed  Nails Reviewed       Diet Order:   Diet Order             Diet full liquid Room service appropriate? Yes; Fluid consistency: Thin  Diet effective now                   EDUCATION NEEDS:   Education needs have been addressed  Skin:  Skin Assessment: Reviewed RN Assessment  Last BM:  06/26/22  Height:   Ht Readings from Last 1 Encounters:  06/13/22 '5\' 7"'$  (1.702 m)    Weight:   Wt Readings from Last 1 Encounters:  06/13/22 60.2 kg    Ideal Body Weight:  67.3 kg  BMI:  There is no height or weight on  file to calculate BMI.  Estimated Nutritional Needs:   Kcal:  1700-1900  Protein:  85-100 grams  Fluid:  > 1.7 L    Loistine Chance, RD, LDN, [place] Registered Dietitian II Certified Diabetes Care and Education Specialist Please refer to [facility] for RD and/or RD on-call/weekend/after hours pager

## 2022-06-28 NOTE — Assessment & Plan Note (Signed)
Improved.  Differential includes viral illness versus atypical presentation of diverticulitis (given CT imaging with mild wall thickening of the sigmoid in the setting of extensive diverticulosis).  No evidence of bowel obstruction on imaging.  Despite receiving multiple doses of Zofran and one-time dose of Reglan, patient is still unable to maintain p.o. intake. - Continue Zofran and Phenergan as needed - IV fluids - Full liquid diet-if tolerating - GI viral panel-no diarrhea or BM at this time

## 2022-06-28 NOTE — Assessment & Plan Note (Signed)
Concern of aspiration pneumonia on imaging and with coughing while taking p.o. -Swallow evaluation -Continue with ceftriaxone and Flagyl

## 2022-06-28 NOTE — Progress Notes (Addendum)
SLP Cancellation Note  Patient Details Name: [name] MRN: [id number] DOB: 1927-04-17   Cancelled treatment:       Reason Eval/Treat Not Completed: Patient declined, no reason specified;Fatigue/lethargy limiting ability to participate;Medical issues which prohibited therapy   SLP consult received and appreciated. Chart review completed. Attempted clinical swallowing evaluation. Pt lethargic, but able to rouse to attempt evaluation. Audible wet, gurgly respirations noted. ?reduced secretion management. Per chart review, pt recently seen 06/13/22 for clinical swallowing evaluation at [facility] recommending a regular diet with thin liquids. Per chart review, CXR, 06/27/22, "Mild multifocal patchy opacities, suspicious for pneumonia, possibly on the basis of aspiration given the clinical history." Noted pt with intractable n/v PTA and during this hospitalization. ?aspiration of vomitus vs prandial aspiration. Pt declined all PO trials despite education/encouragement. Recommend continuation of NPO with frequent oral care. SLP to continue efforts as appropriate. RN and MD made aware.  Cherrie Gauze, M.S., [facility] [phone] [name])  Quintella Baton 06/28/2022, 8:56 AM

## 2022-06-29 DIAGNOSIS — E860 Dehydration: Secondary | ICD-10-CM

## 2022-06-29 DIAGNOSIS — R112 Nausea with vomiting, unspecified: Secondary | ICD-10-CM | POA: Diagnosis not present

## 2022-06-29 DIAGNOSIS — I10 Essential (primary) hypertension: Secondary | ICD-10-CM | POA: Diagnosis not present

## 2022-06-29 DIAGNOSIS — J690 Pneumonitis due to inhalation of food and vomit: Secondary | ICD-10-CM

## 2022-06-29 DIAGNOSIS — N184 Chronic kidney disease, stage 4 (severe): Secondary | ICD-10-CM

## 2022-06-29 LAB — PROCALCITONIN: Procalcitonin: <0.1 ng/mL

## 2022-06-29 MED ORDER — METRONIDAZOLE 500 MG PO TABS: 500.0000 mg | ORAL_TABLET | Freq: Two times a day (BID) | ORAL | 0 refills | Status: AC

## 2022-06-29 MED ORDER — METRONIDAZOLE 500 MG PO TABS: 500.0000 mg | ORAL_TABLET | Freq: Two times a day (BID) | ORAL | Status: DC

## 2022-06-29 MED ORDER — CEFDINIR 250 MG/5ML PO SUSR
300.0000 mg | Freq: Every day | ORAL | Status: DC
  Administered 2022-06-29: 300 mg via ORAL
  Filled 2022-06-29: qty 6

## 2022-06-29 MED ORDER — CEFDINIR 250 MG/5ML PO SUSR: 300.0000 mg | Freq: Every day | ORAL | 0 refills | Status: DC

## 2022-06-29 MED ORDER — SODIUM BICARBONATE 650 MG PO TABS: 1300.0000 mg | ORAL_TABLET | Freq: Three times a day (TID) | ORAL | 1 refills | Status: DC

## 2022-06-29 NOTE — Plan of Care (Signed)
  Problem: Education: Goal: Knowledge of General Education information will improve Description: Including pain rating scale, medication(s)/side effects and non-pharmacologic comfort measures Outcome: Adequate for Discharge   Problem: Health Behavior/Discharge Planning: Goal: Ability to manage health-related needs will improve Outcome: Adequate for Discharge   Problem: Clinical Measurements: Goal: Ability to maintain clinical measurements within normal limits will improve Outcome: Adequate for Discharge Goal: Will remain free from infection Outcome: Adequate for Discharge Goal: Diagnostic test results will improve Outcome: Adequate for Discharge Goal: Respiratory complications will improve Outcome: Adequate for Discharge Goal: Cardiovascular complication will be avoided Outcome: Adequate for Discharge   Problem: Activity: Goal: Risk for activity intolerance will decrease Outcome: Adequate for Discharge   Problem: Nutrition: Goal: Adequate nutrition will be maintained Outcome: Adequate for Discharge   Problem: Coping: Goal: Level of anxiety will decrease Outcome: Adequate for Discharge   Problem: Elimination: Goal: Will not experience complications related to bowel motility Outcome: Adequate for Discharge Goal: Will not experience complications related to urinary retention Outcome: Adequate for Discharge   Problem: Pain Managment: Goal: General experience of comfort will improve Outcome: Adequate for Discharge   Problem: Safety: Goal: Ability to remain free from injury will improve Outcome: Adequate for Discharge   Problem: Skin Integrity: Goal: Risk for impaired skin integrity will decrease Outcome: Adequate for Discharge   Problem: Malnutrition  (NI-5.2) Goal: Food and/or nutrient delivery Description: Individualized approach for food/nutrient provision. Outcome: Adequate for Discharge

## 2022-06-29 NOTE — Progress Notes (Signed)
Pt discharged home with family in stable condition. Discharge instructions given to family. Scripts sent to pharmacy of choice. No immediate questions or concerns at this time. Discharged from unit via wheelchair.

## 2022-06-29 NOTE — Evaluation (Signed)
Clinical/Bedside Swallow Evaluation Patient Details  Name: [name] MRN: [id number] Date of Birth: [birth year]  Today's Date: 06/29/2022 Time: SLP Start Time (ACUTE ONLY): 8 SLP Stop Time (ACUTE ONLY): 1110 SLP Time Calculation (min) (ACUTE ONLY): 50 min  Past Medical History:  Past Medical History:  Diagnosis Date   Anemia    Cardiac disease    CHF (congestive heart failure) (HCC)    Choledocholithiasis    Chronic kidney disease (CKD), stage IV (severe) (HCC)    left   Elevated LFTs    Esophageal stenosis    Hiatal hernia 04/03/2019   large   History of blindness    History of diarrhea    History of gallstones    Hypercholesteremia    Hypertension    Hypothyroidism    Malnutrition ([place])    Osteoporosis    Prostate cancer ([place])    Sepsis ([place])    Shingles    Shingles    Skin cancer    right cheek   Past Surgical History:  Past Surgical History:  Procedure Laterality Date   CARDIAC CATHETERIZATION  04/13/2014   CT PERC CHOLECYSTOSTOMY  07/20/2016   Placed at [facility] Interventional Radiology   ENDOSCOPIC RETROGRADE CHOLANGIOPANCREATOGRAPHY (ERCP) WITH PROPOFOL N/A 04/06/2019   Procedure: ENDOSCOPIC RETROGRADE CHOLANGIOPANCREATOGRAPHY (ERCP) WITH PROPOFOL;  Surgeon: Lucilla Lame, MD;  Location: [facility] ENDOSCOPY;  Service: Endoscopy;  Laterality: N/A;   HPI:  Pt  is a 88 y.o. male with medical history significant of large Hiatal Hernia, recent CVA, CKD stage IV, atrial fibrillation on AC, prostate cancer, anemia of chronic disease, CAD, hypertension, hyperlipidemia, thrombocytopenia who presents to the ED due to nausea and vomiting.     [name] states that over the last 24 to 36 hours, he has developed nausea and vomiting and due to this, has been unable to tolerate any p.o. intake.  His emesis has been nonbloody.  He denies any abdominal pain or diarrhea.  His last bowel movement was in the a.m. of 12/19.  He denies any other symptoms at this time including fever, chills,  chest pain, shortness of breath, palpitations.  He is having some left lower extremity pain.  CXR: Mild multifocal patchy opacities, suspicious for pneumonia, possibly  on the basis of aspiration of VOMIT given the clinical history.   He was recently admitted to [facility] for acute CVA -- during that admit, pt found to have right posterior lobe CVA.  Malnutrition and anorexia was noted then.  Pt receiving Palliative services PTA.    Assessment / Plan / Recommendation  Clinical Impression   Pt seen for BSE today. Pt was awake, alert and verbally conversive w/ this SLP and w/ MD in room. HOH. He denied any further N/V this morning to MD/SLP; he has been drinking water and swallowing Pills w/ NSG w/ No difficulty per NSG, pt. Afebrile, on RA. WBC wnl.   OF NOTE: PT HAS BEEN FOLLOWED BY PALLIATIVE CARE SERVICES PER CHART NOTES. RECOMMEND CONTINUATION OF THIS FOR DISCUSSIONS OF MEDICAL ISSUES AND GOC.  Pt appears to present w/ functional oropharyngeal phase swallowing w/ No overt s/s of aspiration noted during oral intake/trials at bedside. Pt appears at reduced risk for aspiration when following general aspiration precautions.  HOWEVER, pt does have a Large Hiatal Hernia per chart notes which CAN increased Esophageal dysmotility and Regurgitation thus increase risk for aspiration of REFLUX material which can in turn impact the Pulmonary status.  Pt did not c/o Globus feelings nor N/V  during this assessment; he stated he did not eat much at home -- this as been noted at previous assessments.  Discussed w/ him the need to follow general REFLUX precautions as discussed w/ him; lessen dense solids that are difficult to chew w/ his Dentures to lessen Esophageal dysmotility. Recommend f/u w/ GI for further education, managment.   During po trials given, pt consumed ice chips, thin liquids via cup and straw, and purees and soft solids w/ No overt s/s of aspiration noted; no decline in vocal quality or  respiratory status during/post trials; no cough. O2 sats remained upper 90s during trials. During the oral phase, pt exhibited adequate bolus management, mastication, and timely A-P transfer w/ all trials; full oral clearing noted post trials achieved.  OM exam appeared [facility] w/ no unilateral weakness noted. Pt w/ full Dentures in place. Pt fed self w/ setup but w/ support doing well despite baseline Vision deficits.   Recommend a more modified diet to a mech soft consistency for the Cut meats and moistened foods (for ease of chewing, Esophageal phase) w/ thin liquids. Less straw use d/t air swallowed. General aspiration precautions; strict REFLUX precautions d/t large Hiatal Hernia; Tray setup and sitting up support at meals. Pills in Puree - for easier Esophageal clearing. No further ST services indicated as pt appears at his baseline. The above was discussed w/ pt who agreed. MD updated.  SLP Visit Diagnosis: Dysphagia, unspecified (R13.10) (Esophageal phase Dysmotility)    Aspiration Risk  No limitations;Mild aspiration risk (of REFLUX material also)    Diet Recommendation   a more modified diet to a mech soft consistency for the Cut meats and moistened foods (for ease of chewing, Esophageal phase) w/ thin liquids. Less straw use d/t air swallowed. General aspiration precautions; strict REFLUX precautions d/t large Hiatal Hernia. Tray setup and sitting up support at meals.   Medication Administration: Whole meds with puree (for ease of clearing)    Other  Recommendations Recommended Consults: Consider GI evaluation;Consider esophageal assessment (Dietician f/u; Palliative Care f/u) Oral Care Recommendations: Oral care BID;Oral care before and after PO;Patient independent with oral care Other Recommendations:  (n/a)    Recommendations for follow up therapy are one component of a multi-disciplinary discharge planning process, led by the attending physician.  Recommendations may be updated based on  patient status, additional functional criteria and insurance authorization.  Follow up Recommendations No SLP follow up      Assistance Recommended at Discharge  Support at mealtime for setup  Functional Status Assessment Patient has not had a recent decline in their functional status  Frequency and Duration  (n/a)   (n/a)       Prognosis Prognosis for Safe Diet Advancement: Fair (-good) Barriers to Reach Goals: Time post onset;Severity of deficits;Motivation Barriers/Prognosis Comment: Larger Hiatal Hernia; weakness      Swallow Study   General Date of Onset: 06/26/22 HPI: Pt  is a 88 y.o. male with medical history significant of large Hiatal Hernia, recent CVA, CKD stage IV, atrial fibrillation on AC, prostate cancer, anemia of chronic disease, CAD, hypertension, hyperlipidemia, thrombocytopenia who presents to the ED due to nausea and vomiting.     [name] states that over the last 24 to 36 hours, he has developed nausea and vomiting and due to this, has been unable to tolerate any p.o. intake.  His emesis has been nonbloody.  He denies any abdominal pain or diarrhea.  His last bowel movement was in the a.m. of  12/19.  He denies any other symptoms at this time including fever, chills, chest pain, shortness of breath, palpitations.  He is having some left lower extremity pain.  CXR: Mild multifocal patchy opacities, suspicious for pneumonia, possibly  on the basis of aspiration of VOMIT given the clinical history.   He was recently admitted to [facility] for acute CVA -- during that admit, pt found to have right posterior lobe CVA.  Malnutrition and anorexia was noted then.  Pt receiving Palliative services PTA. Type of Study: Bedside Swallow Evaluation Previous Swallow Assessment: 2020; 06/2022 Diet Prior to this Study: Thin liquids (Regular diet at home, prior) Temperature Spikes Noted: No (wbc 5.8) Respiratory Status: Room air History of Recent Intubation: No Behavior/Cognition:  Alert;Cooperative;Pleasant mood;Requires cueing (HOH) Oral Cavity Assessment: Within Functional Limits Oral Care Completed by SLP: Yes Oral Cavity - Dentition: Dentures, top;Dentures, bottom Vision: Functional for self-feeding Self-Feeding Abilities: Able to feed self;Needs set up Patient Positioning: Upright in bed (positioning support) Baseline Vocal Quality: Normal Volitional Cough: Strong Volitional Swallow: Able to elicit    Oral/Motor/Sensory Function Overall Oral Motor/Sensory Function: Within functional limits   Ice Chips Ice chips: Not tested Other Comments: already drinking water in room   Thin Liquid Thin Liquid: Within functional limits Presentation: Self Fed;Cup;Straw (5 trials via each method)    Nectar Thick Nectar Thick Liquid: Not tested   Honey Thick Honey Thick Liquid: Not tested   Puree Puree: Within functional limits Presentation: Self Fed;Spoon (3 trials)   Solid     Solid: Within functional limits Presentation: Self Fed (6 trials)         Orinda Kenner, MS, [place] Speech Language Pathologist Rehab Services; [place] [phone] (ascom)  Polina Burmaster 06/29/2022,1:35 PM

## 2022-06-29 NOTE — TOC Initial Note (Signed)
Transition of Care [facility]) - Initial/Assessment Note    Patient Details  Name: [name] MRN: [id number] Date of Birth: 07-23-1926  [facility]) CM/SW Contact:    Magnus Ivan, LCSW Phone Number: 06/29/2022, 8:00 AM  Clinical Narrative:                 Patient known to [facility] for recent admission this month. Patient was set up with [facility]. See below from [facility] note:   "Pt is from home with his spouse and grandson. The grandson is there at night and the patient has a caregiver there from 9am to 9 pm. He also has caregivers on the weekend. Pt is only without a "care person" for about a couple hours a day.  Family provides needed transportation. The grandson does his medications in a pill box and the caregivers assist him in taking them.  Home health recommended. CM spoke to the patients daughter in law and she states they already use [facility] for pts spouse and asked to use them for him. [facility] has accepted and information on the AVS.  Pt is active with [facility] for palliative care in the home.  TOC following for further d/c needs. "  Expected Discharge Plan: [place] Barriers to Discharge: Continued Medical Work up   Patient Goals and CMS Choice            Expected Discharge Plan and Services       Living arrangements for the past 2 months: Single Family Home                                      Prior Living Arrangements/Services Living arrangements for the past 2 months: Single Family Home Lives with:: Spouse Patient language and need for interpreter reviewed:: Yes        Need for Family Participation in Patient Care: Yes (Comment) Care giver support system in place?: Yes (comment) Current home services: DME, Homehealth aide, Home PT Criminal Activity/Legal Involvement Pertinent to Current Situation/Hospitalization: No - Comment as needed  Activities of Daily Living Home Assistive Devices/Equipment: Walker  (specify type) ADL Screening (condition at time of admission) Patient's cognitive ability adequate to safely complete daily activities?: No Is the patient deaf or have difficulty hearing?: No Does the patient have difficulty seeing, even when wearing glasses/contacts?: Yes Does the patient have difficulty concentrating, remembering, or making decisions?: Yes Patient able to express need for assistance with ADLs?: Yes Does the patient have difficulty dressing or bathing?: Yes Independently performs ADLs?: No Communication: Independent Dressing (OT): Dependent Is this a change from baseline?: Pre-admission baseline Grooming: Dependent Is this a change from baseline?: Pre-admission baseline Feeding: Independent Bathing: Needs assistance Is this a change from baseline?: Change from baseline, expected to last <3 days Toileting: Needs assistance Is this a change from baseline?: Change from baseline, expected to last <3 days In/Out Bed: Needs assistance Is this a change from baseline?: Change from baseline, expected to last <3 days Walks in Home: Needs assistance Is this a change from baseline?: Change from baseline, expected to last <3 days Does the patient have difficulty walking or climbing stairs?: Yes Weakness of Legs: Both Weakness of Arms/Hands: Both  Permission Sought/Granted                  Emotional Assessment  Admission diagnosis:  Diarrhea of presumed infectious origin [R19.7] Dehydration [E86.0] Colitis [K52.9] Acute colitis [K52.9] Intractable nausea and vomiting [R11.2] Patient Active Problem List   Diagnosis Date Noted   Aspiration pneumonia ([place]) 06/28/2022   Frequent PVCs 06/27/2022   Intractable nausea and vomiting 06/26/2022   Delirium 07/62/2633   Acute metabolic encephalopathy 35/45/6256   Stroke ([place]) 06/13/2022   Goals of care, counseling/discussion 02/13/2022   Androgen deprivation therapy 02/13/2022   Elevated PSA 06/20/2021    Low BP 04/08/2020   Kidney failure 04/08/2020   Congestive heart failure ([place]) 04/08/2020   Acidosis 05/27/2019   Anemia of chronic kidney failure, unspecified stage 05/27/2019   Benign hypertensive kidney disease with chronic kidney disease 05/27/2019   Proteinuria 05/27/2019   Secondary hyperparathyroidism of renal origin ([place]) 05/27/2019   Blind 04/08/2019   CAD (coronary artery disease) 04/08/2019   Thrombocytopathia ([place]) 04/08/2019   Choledocholithiasis    Elevated liver enzymes    Stricture and stenosis of esophagus    Malnutrition of moderate degree 04/03/2019   Chronic diastolic (congestive) heart failure ([place]) 03/31/2019   Malignant neoplasm of prostate ([place]) 03/31/2019   Weakness    Elevated LFTs    CKD (chronic kidney disease), stage IV (HCC)    Aortic atherosclerosis ([place])    Bilateral nephrolithiasis    Hydronephrosis concurrent with and due to calculi of kidney and ureter    Acute encephalopathy 03/08/2018   Gallbladder abscess 07/31/2016   Cholecystitis 07/20/2016   Hematuria 06/25/2016   Sepsis ([place]) 06/25/2016   Acute cholecystitis    Hypokalemia 07/23/2015   Generalized weakness 07/23/2015   Bladder outlet obstruction 07/23/2015   Acute delirium 07/23/2015   Benign essential HTN 07/23/2015   Acute on chronic renal failure ([place]) 07/20/2015   GIB (gastrointestinal bleeding) 06/03/2015   Kidney stone 05/25/2012   Ureteric stone 05/25/2012   Hydronephrosis 05/25/2012   Chronic kidney disease, stage IV (severe) ([place]) 05/25/2012   PCP:  Merryl Hacker, No Pharmacy:   [facility], [place] - [address] [place] [address] Phone: [phone] Fax: [phone]  [id number] [address] [place]: [phone]: [phone] [facility] [address]: [phone]: [phone]    Social  Determinants of Health (SDOH) Social History: [place] No Food Insecurity (06/27/2022)  Housing: Low Risk  (06/27/2022)  Transportation Needs: No Transportation Needs (06/27/2022)  Utilities: Not At Risk (06/27/2022)  Tobacco Use: High Risk (06/27/2022)   SDOH Interventions:     Readmission Risk Interventions     No data to display

## 2022-06-29 NOTE — TOC Transition Note (Signed)
Transition of Care [facility]) - CM/SW Discharge Note   Patient Details  Name: [name] MRN: [id number] Date of Birth: 09-16-1926  Transition of Care [facility]) CM/SW Contact:  Magnus Ivan, LCSW Phone Number: 06/29/2022, 2:52 PM   Clinical Narrative:    Notified by MD that patient will DC today, family asking for all services. CSW confirmed with Tommi Rumps with [facility] that they can provide all services. Notified [place] [facility] [place] [facility] today.    Final next level of care: [place] Barriers to Discharge: Barriers Resolved   Patient Goals and CMS Choice      Discharge Placement                         Discharge Plan and Services Additional resources added to the After Visit Summary for                            [facility] Arranged: PT, OT, RN, Nurse's Aide, Social Work [facility] Agency: [place] Date [facility] Agency Contacted: 06/29/22   Representative spoke with at [place]: [place] Determinants of Health ([place]) Interventions SDOH Screenings   Food Insecurity: No Food Insecurity (06/27/2022)  Housing: Low Risk  (06/27/2022)  Transportation Needs: No Transportation Needs (06/27/2022)  Utilities: Not At Risk (06/27/2022)  Tobacco Use: High Risk (06/27/2022)     Readmission Risk Interventions     No data to display

## 2022-06-29 NOTE — Care Management Important Message (Signed)
Important Message  Patient Details  Name: [name] DOBOSZ MRN: [id number] Date of Birth: 29-Sep-1926   Medicare Important Message Given:  N/A - LOS <3 / Initial given by admissions     Juliann Pulse A Gloyd Happ 06/29/2022, 8:13 AM

## 2022-06-29 NOTE — Discharge Summary (Signed)
Physician Discharge Summary   Patient: [name] MRN: [id number] DOB: 04-26-1927  Admit date:     06/26/2022  Discharge date: 06/29/22  Discharge Physician: Lorella Nimrod   PCP: Pcp, No   Recommendations at discharge:  Please obtain CBC and BMP in 1 week To ensure the completion of antibiotics Follow-up with primary care provider Follow-up with nephrology  Discharge Diagnoses: Principal Problem:   Intractable nausea and vomiting Active Problems:   Aspiration pneumonia ([place])   Frequent PVCs   Chronic kidney disease, stage IV (severe) (HCC)   Chronic diastolic (congestive) heart failure ([place])   Benign essential HTN   Hypokalemia   Dehydration   Hospital Course: Taken from H&P.  [name] is a 88 y.o. male with medical history significant of recent CVA, CKD stage IV, atrial fibrillation on AC, prostate cancer, anemia of chronic disease, CAD, hypertension, hyperlipidemia, thrombocytopenia who presents to the ED due to nausea and vomiting, for the past 1 to 2 days.  Unable to tolerate any p.o. intake.  No abdominal pain or diarrhea.  No fever, chills, chest pain or shortness of breath.  ED course: On arrival to the ED, patient was normotensive at 105/61 with heart rate 73.  He was saturating at 98% on room air. Initial workup remarkable for creatinine of 3.39, BUN of 39, and GFR of 16.  WBC within normal limits.  Lipase within normal limit.  UA with small hemoglobin, mild ketonuria and protein urea.  COVID-19, influenza and RSV PCR negative.  CT abdomen/pelvis was obtained that demonstrated extensive diverticulosis with mild wall thickening of the sigmoid colon suggesting possible infectious or inflammatory colitis.  No other acute changes noted.  Due to patient's inability to tolerate p.o. intake in the ED even after multiple doses of Zofran and Reglan, [facility] contacted to admit.  12/20: Hemodynamically stable.  Labs pertinent for hemoglobin decreased to 11>>9.7, but all cell  lines decreased so most likely some dilutional effect.  Potassium 3.2, magnesium 2, bicarb 20, some improvement to creatinine at 3.01 with GFR improved to 19 from 16 which is at his baseline. Patient continued to have vomiting and unable to take any p.o. intake despite getting Zofran and Phenergan, denies any pain or diarrhea.  12/21: Overnight nurse noted some coarseness at lungs, stat chest x-ray with mild multifocal patchy opacities, suspicious for pneumonia.  Procalcitonin at 0.13, no leukocytosis.  Blood cultures remain negative in 12 hours.  No lactic acidosis and CK within normal limit. He was started on ceftriaxone and Flagyl for concern of aspiration. This morning vital stable.  Slight decrease in bicarb to 17-increasing the home bicarb dose to 3 times daily.  Renal functions with slight improvement in GFR to 20. Speech evaluation was incomplete as patient was refusing any p.o. intake at that time.  He was able to take some ice cream with nursing staff.  Later there was worsening cough with any p.o. intake.  Ask speech to reevaluate.  12/22: Patient with significant improvement.  Able to tolerate diet.  No more nausea and vomiting.  Patient would like to go home.  He is being discharged on cefdinir and Flagyl for 3 more days for concern of aspiration pneumonia. Discussed with Santiago Glad and she would like to get maximum home health services which were ordered.  Patient will continue with rest of his home medications and follow-up with his providers for further recommendations.  Patient will be high risk for deterioration and mortality based on advanced age and underlying  comorbidities.  Assessment and Plan: * Intractable nausea and vomiting Improved.  Differential includes viral illness versus atypical presentation of diverticulitis (given CT imaging with mild wall thickening of the sigmoid in the setting of extensive diverticulosis).  No evidence of bowel obstruction on imaging.  Despite  receiving multiple doses of Zofran and one-time dose of Reglan, patient is still unable to maintain p.o. intake. - Continue Zofran and Phenergan as needed - IV fluids - Full liquid diet-if tolerating - GI viral panel-no diarrhea or BM at this time  Aspiration pneumonia (HCC) Concern of aspiration pneumonia on imaging and with coughing while taking p.o. -Swallow evaluation -Continue with ceftriaxone and Flagyl  Frequent PVCs EKG with frequent PVCs.  Per chart review, patient has a history of similar findings previously.  Will monitor electrolytes and replete as necessary.  Potassium at 3.2 with normal magnesium. -Replete electrolytes as needed - Daily BMP and magnesium while unable to tolerate p.o. intake and vomiting  Chronic kidney disease, stage IV (severe) ([place]) Renal function currently at baseline.  Will monitor closely given poor p.o. intake. -Daily BMP while admitted  Chronic diastolic (congestive) heart failure ([place]) Patient appears euvolemic at this time.  Given poor p.o. intake and ongoing vomiting, will hold home torsemide at this time. -Holding home torsemide  Benign essential HTN - Resume home antihypertensives when able  Hypokalemia Resolved.         Consultants: None Procedures performed: None Disposition: Home health Diet recommendation:  Discharge Diet Orders (From admission, onward)     Start     Ordered   06/29/22 0000  Diet - low sodium heart healthy        06/29/22 1438           Cardiac diet DISCHARGE MEDICATION: Allergies as of 06/29/2022       Reactions   Ativan [lorazepam] Other (See Comments)   Hallucinations, combative   Penicillin G Other (See Comments)   Has patient had a PCN reaction causing immediate rash, facial/tongue/throat swelling, SOB or lightheadedness with hypotension: Yes Has patient had a PCN reaction causing severe rash involving mucus membranes or skin necrosis: No Has patient had a PCN reaction that required  hospitalization No Has patient had a PCN reaction occurring within the last 10 years: No If all of the above answers are "NO", then may proceed with Cephalosporin use.   Amlodipine Swelling   Lisinopril Other (See Comments)   Spironolactone Other (See Comments)   Tessalon [benzonatate]         Medication List     TAKE these medications    aspirin EC 81 MG tablet Take 81 mg by mouth at bedtime.   atorvastatin 40 MG tablet Commonly known as: LIPITOR Take 40 mg by mouth at bedtime.   cefdinir 250 MG/5ML suspension Commonly known as: OMNICEF Take 6 mLs (300 mg total) by mouth daily. Start taking on: June 30, 2022   [employer] Digestive Probiotic 250 MG capsule Generic drug: saccharomyces boulardii Take 250 mg by mouth 2 (two) times daily.   cyanocobalamin 1000 MCG/ML injection Commonly known as: VITAMIN B12 Inject 1,000 mcg into the muscle every 30 (thirty) days. Every 15th of the month.   Eliquis 2.5 MG Tabs tablet Generic drug: apixaban Take 2.5 mg by mouth 2 (two) times daily.   ferrous sulfate 325 (65 FE) MG tablet Take 325 mg by mouth 2 (two) times daily.   Fish Oil 1200 MG Caps Take 1,200 mg by mouth 2 (two) times daily.  isosorbide mononitrate 30 MG 24 hr tablet Commonly known as: IMDUR Take 1 tablet (30 mg total) by mouth daily.   levalbuterol 1.25 MG/3ML nebulizer solution Commonly known as: XOPENEX Take 1.25 mg by nebulization every 8 (eight) hours as needed.   levothyroxine 100 MCG tablet Commonly known as: SYNTHROID Take 100 mcg by mouth daily before breakfast.   metoprolol succinate 25 MG 24 hr tablet Commonly known as: TOPROL-XL Take 25 mg by mouth daily.   metroNIDAZOLE 500 MG tablet Commonly known as: FLAGYL Take 1 tablet (500 mg total) by mouth every 12 (twelve) hours for 3 days.   nitroGLYCERIN 0.4 MG SL tablet Commonly known as: NITROSTAT Place 0.4 mg under the tongue every 5 (five) minutes as needed for chest pain.   ondansetron 8 MG  disintegrating tablet Commonly known as: ZOFRAN-ODT Take 1 tablet (8 mg total) by mouth every 8 (eight) hours as needed for nausea or vomiting.   pantoprazole 40 MG tablet Commonly known as: PROTONIX Take 40 mg by mouth daily.   potassium chloride 10 MEQ tablet Commonly known as: KLOR-CON Take 10 mEq by mouth daily.   sodium bicarbonate 650 MG tablet Take 2 tablets (1,300 mg total) by mouth 3 (three) times daily. What changed: when to take this   tamsulosin 0.4 MG Caps capsule Commonly known as: FLOMAX Take 0.4 mg by mouth at bedtime.   torsemide 20 MG tablet Commonly known as: DEMADEX Take 40 mg by mouth in the morning and at bedtime.   Vitamin D3 125 MCG (5000 UT) Tabs Take 5,000 Units by mouth daily.        Discharge Exam: Filed Weights   06/29/22 0535  Weight: 55.2 kg   General.  Frail elderly man, in no acute distress. Pulmonary.  Lungs clear bilaterally, normal respiratory effort. CV.  Regular rate and rhythm, no JVD, rub or murmur. Abdomen.  Soft, nontender, nondistended, BS positive. CNS.  Alert and oriented .  No focal neurologic deficit. Extremities.  No edema, no cyanosis, pulses intact and symmetrical. Psychiatry.  Appears to have some cognitive impairment  Condition at discharge: stable  The results of significant diagnostics from this hospitalization (including imaging, microbiology, ancillary and laboratory) are listed below for reference.   Imaging Studies: DG Chest Port 1 View  Result Date: 06/27/2022 CLINICAL DATA:  Aspiration EXAM: PORTABLE CHEST 1 VIEW COMPARISON:  06/12/2022 FINDINGS: Mild multifocal patchy opacities in the lungs bilaterally, right basilar predominant. No pleural effusion or pneumothorax. The heart is top-normal in size. IMPRESSION: Mild multifocal patchy opacities, suspicious for pneumonia, possibly on the basis of aspiration given the clinical history. Electronically Signed   By: Julian Hy M.D.   On: 06/27/2022 22:18    CT ABDOMEN PELVIS WO CONTRAST  Result Date: 06/26/2022 CLINICAL DATA:  Acute lower back pain.  Vomiting. EXAM: CT ABDOMEN AND PELVIS WITHOUT CONTRAST TECHNIQUE: Multidetector CT imaging of the abdomen and pelvis was performed following the standard protocol without IV contrast. RADIATION DOSE REDUCTION: This exam was performed according to the departmental dose-optimization program which includes automated exposure control, adjustment of the mA and/or kV according to patient size and/or use of iterative reconstruction technique. COMPARISON:  March 31, 2019. FINDINGS: Lower chest: Visualized lung bases are unremarkable. Large hiatal hernia is noted. Hepatobiliary: No focal liver abnormality is seen. No gallstones, gallbladder wall thickening, or biliary dilatation. Pancreas: Unremarkable. No pancreatic ductal dilatation or surrounding inflammatory changes. Spleen: Normal in size without focal abnormality. Adrenals/Urinary Tract: Adrenal glands appear normal. There is  continued presence of severe left renal cortical atrophy and severe left hydroureteronephrosis secondary to distal left ureteral calculi. This is unchanged compared to prior exam. Right kidney and ureter are unremarkable. Multiple small bladder diverticula are noted. Stomach/Bowel: There is no definite evidence of bowel obstruction. Extensive diverticulosis of descending and sigmoid colon is again noted. There is noted mild wall thickening of the sigmoid colon suggesting possible infectious or inflammatory colitis. Vascular/Lymphatic: Aortic atherosclerosis. No enlarged abdominal or pelvic lymph nodes. Reproductive: Prostate is unremarkable. Other: No abdominal wall hernia or abnormality. No abdominopelvic ascites. Musculoskeletal: Severe osteoarthritis is seen involving the left hip. No acute osseous abnormality is noted. IMPRESSION: Extensive diverticulosis is again noted involving the descending and sigmoid colon. There is now noted mild  wall thickening of the sigmoid colon suggesting possible infectious or inflammatory colitis. Large sliding-type hiatal hernia. Stable appearance of severe left renal cortical atrophy with associated severe left hydroureteronephrosis due to multiple distal left ureteral calculi. Severe osteoarthritis of the left hip. Aortic Atherosclerosis (ICD10-I70.0). Electronically Signed   By: Marijo Conception M.D.   On: 06/26/2022 17:33   ECHOCARDIOGRAM COMPLETE BUBBLE STUDY  Result Date: 06/13/2022    ECHOCARDIOGRAM REPORT   Patient Name:   RYOMA NOFZIGER Date of Exam: 06/13/2022 Medical Rec #:  [id number]     Height:       67.0 in Accession #:    [id number]    Weight:       132.0 lb Date of Birth:  [birth year]    BSA:          1.695 m Patient Age:    45 years      BP:           132/59 mmHg Patient Gender: M             HR:           65 bpm. Exam Location:  Inpatient Procedure: 2D Echo, Cardiac Doppler, Color Doppler and Saline Contrast Bubble            Study Indications:    Stroke 434.91 / I63.9  History:        Patient has prior history of Echocardiogram examinations, most                 recent 03/12/2018. CHF; Risk Factors:Hypertension.  Sonographer:    Bernadene Person RDCS Referring Phys: [id number] [place]  1. Left ventricular ejection fraction, by estimation, is 50 to 55%. The left ventricle has low normal function. The left ventricle has no regional wall motion abnormalities. Left ventricular diastolic parameters are consistent with Grade I diastolic dysfunction (impaired relaxation).  2. Right ventricular systolic function is normal. The right ventricular size is normal. Tricuspid regurgitation signal is inadequate for assessing PA pressure.  3. Left atrial size was mildly dilated.  4. The mitral valve is normal in structure. Mild to moderate mitral valve regurgitation. No evidence of mitral stenosis.  5. The aortic valve is tricuspid. There is mild calcification of the aortic valve. Aortic valve  regurgitation is not visualized. Aortic valve sclerosis/calcification is present, without any evidence of aortic stenosis.  6. Aortic dilatation noted. There is mild dilatation of the ascending aorta, measuring 39 mm.  7. The inferior vena cava is normal in size with greater than 50% respiratory variability, suggesting right atrial pressure of 3 mmHg.  8. Negative bubble study, no evidence for ASD or PFO. FINDINGS  Left Ventricle: Left ventricular ejection fraction, by estimation, is  50 to 55%. The left ventricle has low normal function. The left ventricle has no regional wall motion abnormalities. The left ventricular internal cavity size was normal in size. There is no left ventricular hypertrophy. Left ventricular diastolic parameters are consistent with Grade I diastolic dysfunction (impaired relaxation). Right Ventricle: The right ventricular size is normal. No increase in right ventricular wall thickness. Right ventricular systolic function is normal. Tricuspid regurgitation signal is inadequate for assessing PA pressure. Left Atrium: Left atrial size was mildly dilated. Right Atrium: Right atrial size was normal in size. Pericardium: There is no evidence of pericardial effusion. Mitral Valve: The mitral valve is normal in structure. Mild to moderate mitral valve regurgitation. No evidence of mitral valve stenosis. Tricuspid Valve: The tricuspid valve is normal in structure. Tricuspid valve regurgitation is not demonstrated. Aortic Valve: The aortic valve is tricuspid. There is mild calcification of the aortic valve. Aortic valve regurgitation is not visualized. Aortic valve sclerosis/calcification is present, without any evidence of aortic stenosis. Pulmonic Valve: The pulmonic valve was normal in structure. Pulmonic valve regurgitation is not visualized. Aorta: The aortic root is normal in size and structure and aortic dilatation noted. There is mild dilatation of the ascending aorta, measuring 39 mm.  Venous: The inferior vena cava is normal in size with greater than 50% respiratory variability, suggesting right atrial pressure of 3 mmHg. IAS/Shunts: Negative bubble study, no evidence for ASD or PFO. Agitated saline contrast was given intravenously to evaluate for intracardiac shunting.  LEFT VENTRICLE PLAX 2D LVIDd:         3.70 cm     Diastology LVIDs:         2.80 cm     LV e' medial:    5.73 cm/s LV PW:         0.80 cm     LV E/e' medial:  11.7 LV IVS:        0.70 cm     LV e' lateral:   7.42 cm/s LVOT diam:     2.20 cm     LV E/e' lateral: 9.0 LV SV:         65 LV SV Index:   38 LVOT Area:     3.80 cm  LV Volumes (MOD) LV vol d, MOD A2C: 95.5 ml LV vol d, MOD A4C: 83.0 ml LV vol s, MOD A2C: 53.0 ml LV vol s, MOD A4C: 40.0 ml LV SV MOD A2C:     42.5 ml LV SV MOD A4C:     83.0 ml LV SV MOD BP:      42.1 ml RIGHT VENTRICLE RV S prime:     11.00 cm/s TAPSE (M-mode): 2.1 cm LEFT ATRIUM             Index        RIGHT ATRIUM           Index LA diam:        3.20 cm 1.89 cm/m   RA Area:     15.30 cm LA Vol (A2C):   50.7 ml 29.91 ml/m  RA Volume:   37.40 ml  22.07 ml/m LA Vol (A4C):   40.8 ml 24.07 ml/m LA Biplane Vol: 46.2 ml 27.26 ml/m  AORTIC VALVE LVOT Vmax:   76.35 cm/s LVOT Vmean:  52.750 cm/s LVOT VTI:    0.170 m  AORTA Ao Root diam: 3.50 cm Ao Asc diam:  3.90 cm MITRAL VALVE MV Area (PHT): 3.27 cm  SHUNTS MV Decel Time: 232 msec       Systemic VTI:  0.17 m MR Peak grad:    140.7 mmHg   Systemic Diam: 2.20 cm MR Mean grad:    96.0 mmHg MR Vmax:         593.00 cm/s MR Vmean:        463.0 cm/s MR PISA:         0.57 cm MR PISA Eff ROA: 4 mm MR PISA Radius:  0.30 cm MV E velocity: 66.90 cm/s MV A velocity: 86.90 cm/s MV E/A ratio:  0.77 Dalton McleanMD Electronically signed by Franki Monte Signature Date/Time: 06/13/2022/4:04:25 PM    Final    VAS US CAROTID  Result Date: 06/13/2022 Carotid Arterial Duplex Study Patient Name:  KAILAN LAWS  Date of Exam:   06/13/2022 Medical Rec #: [id number]       Accession #:    [id number] Date of Birth: October 19, 1926     Patient Gender: M Patient Age:   71 years Exam [facility]:  [facility] Procedure:      VAS US CAROTID Referring Phys: Irene Pap --------------------------------------------------------------------------------  Indications:       CVA. Risk Factors:      Hypertension, hyperlipidemia, past history of smoking,                    coronary artery disease. Limitations        Today's exam was limited due to Patient movement, respiratory                    variation. Comparison Study:  No prior studies. Performing Technologist: Darlin Coco RDMS, RVT  Examination Guidelines: A complete evaluation includes B-mode imaging, spectral Doppler, color Doppler, and power Doppler as needed of all accessible portions of each vessel. Bilateral testing is considered an integral part of a complete examination. Limited examinations for reoccurring indications may be performed as noted.  Right Carotid Findings: +----------+--------+--------+--------+------------------+--------+           PSV cm/sEDV cm/sStenosisPlaque DescriptionComments +----------+--------+--------+--------+------------------+--------+ CCA Prox  65      16                                         +----------+--------+--------+--------+------------------+--------+ CCA Distal57      16                                         +----------+--------+--------+--------+------------------+--------+ ICA Prox  107     12      1-39%   focal and calcific         +----------+--------+--------+--------+------------------+--------+ ICA Mid   72      15                                         +----------+--------+--------+--------+------------------+--------+ ICA Distal82      21                                         +----------+--------+--------+--------+------------------+--------+ ECA       95                                                  +----------+--------+--------+--------+------------------+--------+ +----------+--------+-------+----------------+-------------------+  PSV cm/sEDV cmsDescribe        Arm Pressure (mmHG) +----------+--------+-------+----------------+-------------------+ [id number]             Multiphasic, WNL                    +----------+--------+-------+----------------+-------------------+ +---------+--------+--+--------+-+---------+ VertebralPSV cm/s52EDV cm/s8Antegrade +---------+--------+--+--------+-+---------+  Left Carotid Findings: +----------+--------+--------+--------+-------------------------+--------+           PSV cm/sEDV cm/sStenosisPlaque Description       Comments +----------+--------+--------+--------+-------------------------+--------+ CCA Prox  60      10                                                +----------+--------+--------+--------+-------------------------+--------+ CCA Distal56      10                                                +----------+--------+--------+--------+-------------------------+--------+ ICA Prox  46      10      1-39%   calcific and heterogenous         +----------+--------+--------+--------+-------------------------+--------+ ICA Mid   70      16                                                +----------+--------+--------+--------+-------------------------+--------+ ICA Distal75      11                                                +----------+--------+--------+--------+-------------------------+--------+ ECA       78                                                        +----------+--------+--------+--------+-------------------------+--------+ +----------+--------+--------+----------------+-------------------+           PSV cm/sEDV cm/sDescribe        Arm Pressure (mmHG) +----------+--------+--------+----------------+-------------------+ [id number]              Multiphasic, WNL                     +----------+--------+--------+----------------+-------------------+ +---------+--------+--+--------+-+---------+ VertebralPSV cm/s48EDV cm/s8Antegrade +---------+--------+--+--------+-+---------+   Summary: Right Carotid: Velocities in the right ICA are consistent with a 1-39% stenosis. Left Carotid: Velocities in the left ICA are consistent with a 1-39% stenosis. Vertebrals:  Bilateral vertebral arteries demonstrate antegrade flow. Subclavians: Normal flow hemodynamics were seen in bilateral subclavian              arteries. *See table(s) above for measurements and observations.  Electronically signed by Antony Contras MD on 06/13/2022 at 3:32:36 PM.    Final    EEG adult  Result Date: 06/13/2022 Kennon Portela, MD     06/13/2022 12:19 PM [facility] Routine EEG Report Demographics: Patient Name:   Herman, Fiero Date of Birth:   1926/07/19 Identification Number:   MRN -  [id number] Study Times: Study Start Time:   06/13/2022 10:29:23 Study End Time:   06/13/2022 10:55:13 Indication(s): Encephalopathy Technical Summary: This EEG was performed utilizing standard International 10-20 System of electrode placement. One channel electrocardiogram was monitored. Data were obtained, stored, and interpreted utilizing referential montage recording, with reformatting to longitudinal, transverse bipolar, and referential montages as necessary for interpretation. State(s):       Awake      Drowsy Activation Procedures: Photic Stimulation: EEG Description: Posterior dominant rhythm is 5-6 hz theta. There are no epileptiform discharges or focal slowing noted. No sleep architecture identified. Impression: This is an abnormal EEG due to the presence of a moderate encephalopathy of metabolic, toxic, infectious, or neurodegenerative etiology. There are no epileptiform discharges. Clinical correlation is advised. Dr Kennon Portela [facility] For Inpatient  follow-up with [facility] physician please call [facility] [phone]. This is not an outpatient service. Post hospital discharge, please contact hospital directly.   MR ANGIO HEAD WO CONTRAST  Result Date: 06/13/2022 CLINICAL DATA:  88 year old male with altered mental status and punctate right superior frontal motor cortex infarct on MRI. EXAM: MRA HEAD WITHOUT CONTRAST TECHNIQUE: Angiographic images of the Circle of Willis were acquired using MRA technique without intravenous contrast. COMPARISON:  Brain MRI 0204 hours today. FINDINGS: Anterior circulation: Antegrade flow in both ICA siphons. Mildly dolichoectatic, tortuous bilateral siphons with no stenosis. Mildly tortuous carotid termini. Patent MCA and ACA origins without stenosis. Ophthalmic artery origins appear normal. Normal anterior communicating artery. Visible ACA branches are within normal limits. Left MCA M1 and bifurcation are patent without stenosis. Visible left MCA branches are within normal limits. Right MCA M1 segment is patent with mild irregularity and stenosis (series 1055, image 6). Patent right anterior temporal artery origin and right MCA bifurcation with no significant stenosis. Visible right MCA branches are within normal limits. Posterior circulation: Antegrade flow in the posterior circulation. Dominant distal left vertebral artery, the right is diminutive beyond the normal PICA origin. Left PICA origin is normal. Mild distal vertebral irregularity in keeping with atherosclerosis but no significant stenosis. Patent basilar artery without stenosis. AICA, SCA and PCA origins are patent. Posterior communicating arteries are diminutive or absent. Left PCA occludes in the P 2 segment, superimposed on chronic left PCA territory infarct with encephalomalacia demonstrated on MRI today. There is moderate irregularity and stenosis of right PCA P3 branches. Anatomic variants: Dominant left vertebral artery, the right is diminutive beyond  PICA. Other: Left greater than right PCA territory encephalomalacia as seen by MRI. No intracranial mass effect is evident. IMPRESSION: 1. No evidence of emergent large vessel occlusion. 2. 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 Moderate right PCA P3 branch atherosclerosis and stenosis. 3. Dolichoectatic ICAs and evidence of mild right MCA atherosclerosis. No significant anterior circulation stenosis. Electronically Signed   By: Genevie Ann M.D.   On: 06/13/2022 06:19   MR BRAIN WO CONTRAST  Result Date: 06/13/2022 CLINICAL DATA:  Initial evaluation for delirium, altered mental status. EXAM: MRI HEAD WITHOUT CONTRAST TECHNIQUE: Multiplanar, multiecho pulse sequences of the brain and surrounding structures were obtained without intravenous contrast. COMPARISON:  Prior CT from 06/12/2022. FINDINGS: Brain: Cerebral volume within normal limits for age. Generalized age-related cerebral atrophy. Patchy and confluent T2/FLAIR hyperintensity involving the periventricular and deep white matter both cerebral hemispheres, consistent with chronic microvascular ischemic disease, moderately advanced in nature. Chronic bilateral PCA territory infarcts a are seen, left larger than right. Few scattered small remote bilateral cerebellar infarcts present, also slightly worse  on the left. Multiple remote lacunar infarcts noted about the deep gray nuclei and hemispheric cerebral white matter. There is a single punctate focus of restricted diffusion involving the posterior right frontal lobe/precentral gyrus (series 7, image 50). Associated signal loss on ADC map (series 8, image 15). Findings consistent with a tiny acute ischemic infarct. No associated hemorrhage or mass effect. No other convincing evidence for acute or subacute ischemia. No acute intracranial hemorrhage. Multiple chronic micro hemorrhages noted, most notably about the bilateral thalami, likely related to chronic poorly controlled hypertension. No  mass lesion, midline shift or mass effect. Ventricular prominence related to global parenchymal volume loss without hydrocephalus. No extra-axial fluid collection. Pituitary gland suprasellar region within normal limits. Vascular: Major intracranial vascular flow voids are maintained. Skull and upper cervical spine: Craniocervical junction within normal limits. Bone marrow signal intensity normal. No scalp soft tissue abnormality. Sinuses/Orbits: Prior bilateral ocular lens replacement. Paranasal sinuses are largely clear. No significant mastoid effusion. Other: None. IMPRESSION: 1. Single punctate acute ischemic nonhemorrhagic infarct involving the posterior right frontal lobe/precentral gyrus. 2. No other acute intracranial abnormality. 3. Age-related cerebral atrophy with moderate chronic microvascular ischemic disease, with multiple chronic ischemic infarcts as above. Electronically Signed   By: Jeannine Boga M.D.   On: 06/13/2022 02:29   CT HEAD WO CONTRAST (5MM)  Result Date: 06/12/2022 CLINICAL DATA:  Delirium. EXAM: CT HEAD WITHOUT CONTRAST TECHNIQUE: Contiguous axial images were obtained from the base of the skull through the vertex without intravenous contrast. RADIATION DOSE REDUCTION: This exam was performed according to the departmental dose-optimization program which includes automated exposure control, adjustment of the mA and/or kV according to patient size and/or use of iterative reconstruction technique. COMPARISON:  12/02/2010. FINDINGS: Brain: No acute intracranial hemorrhage, midline shift or mass effect. No extra-axial fluid collection. An old infarct is noted in the left cerebellar hemisphere. There is an old infarct involving the parieto-occipital region on the left. An old infarct is noted in the occipital lobe on the right. Diffuse atrophy is noted. Periventricular white matter hypodensities are noted bilaterally. Vascular: Atherosclerotic calcification of the carotid siphons and  vertebral arteries. No hyperdense vessel. Skull: Normal. Negative for fracture or focal lesion. Sinuses/Orbits: No acute finding. Other: None. IMPRESSION: 1. No acute intracranial process. 2. Atrophy with chronic microvascular ischemic changes. 3. Multiple old infarcts bilaterally involving the occipital lobes bilaterally, parietal lobe on the left, and cerebellum on the left. Electronically Signed   By: Brett Fairy M.D.   On: 06/12/2022 21:27   DG Chest Portable 1 View  Result Date: 06/12/2022 CLINICAL DATA:  Pneumonia EXAM: PORTABLE CHEST 1 VIEW COMPARISON:  03/03/2022 FINDINGS: Retrocardiac opacification is again seen likely related to the large hiatal hernia better appreciated on prior examinations. Possible small left pleural effusion. Lungs are otherwise clear. No pneumothorax. No pleural effusion on the right. Cardiac size within normal limits. Pulmonary vascularity is normal. No acute bone abnormality. IMPRESSION: 1. Retrocardiac opacification likely related to known large hiatal hernia. 2. Possible small left pleural effusion. Electronically Signed   By: Fidela Salisbury M.D.   On: 06/12/2022 19:48    Microbiology: Results for orders placed or performed during the hospital encounter of 06/26/22  Resp panel by RT-PCR (RSV, Flu A&B, Covid) Anterior Nasal Swab     Status: None   Collection Time: 06/26/22 12:41 PM   Specimen: Anterior Nasal Swab  Result Value Ref Range Status   SARS Coronavirus 2 by RT PCR NEGATIVE NEGATIVE Final    Comment: (  NOTE) SARS-CoV-2 target nucleic acids are NOT DETECTED.  The SARS-CoV-2 RNA is generally detectable in upper respiratory specimens during the acute phase of infection. The lowest concentration of SARS-CoV-2 viral copies this assay can detect is 138 copies/mL. A negative result does not preclude SARS-Cov-2 infection and should not be used as the sole basis for treatment or other patient management decisions. A negative result may occur with  improper  specimen collection/handling, submission of specimen other than nasopharyngeal swab, presence of viral mutation(s) within the areas targeted by this assay, and inadequate number of viral copies(<138 copies/mL). A negative result must be combined with clinical observations, patient history, and epidemiological information. The expected result is Negative.  Fact Sheet for Patients:  EntrepreneurPulse.com.au  Fact Sheet for Healthcare Providers:  IncredibleEmployment.be  This test is no t yet approved or cleared by the [place] FDA and  has been authorized for detection and/or diagnosis of SARS-CoV-2 by FDA under an Emergency Use Authorization (EUA). This EUA will remain  in effect (meaning this test can be used) for the duration of the COVID-19 declaration under Section 564(b)(1) of the Act, 21 U.S.C.section 360bbb-3(b)(1), unless the authorization is terminated  or revoked sooner.       Influenza A by PCR NEGATIVE NEGATIVE Final   Influenza B by PCR NEGATIVE NEGATIVE Final    Comment: (NOTE) The Xpert Xpress SARS-CoV-2/FLU/RSV plus assay is intended as an aid in the diagnosis of influenza from Nasopharyngeal swab specimens and should not be used as a sole basis for treatment. Nasal washings and aspirates are unacceptable for Xpert Xpress SARS-CoV-2/FLU/RSV testing.  Fact Sheet for Patients: EntrepreneurPulse.com.au  Fact Sheet for Healthcare Providers: IncredibleEmployment.be  This test is not yet approved or cleared by the [place] FDA and has been authorized for detection and/or diagnosis of SARS-CoV-2 by FDA under an Emergency Use Authorization (EUA). This EUA will remain in effect (meaning this test can be used) for the duration of the COVID-19 declaration under Section 564(b)(1) of the Act, 21 U.S.C. section 360bbb-3(b)(1), unless the authorization is terminated or revoked.     Resp  Syncytial Virus by PCR NEGATIVE NEGATIVE Final    Comment: (NOTE) Fact Sheet for Patients: EntrepreneurPulse.com.au  Fact Sheet for Healthcare Providers: IncredibleEmployment.be  This test is not yet approved or cleared by the [place] FDA and has been authorized for detection and/or diagnosis of SARS-CoV-2 by FDA under an Emergency Use Authorization (EUA). This EUA will remain in effect (meaning this test can be used) for the duration of the COVID-19 declaration under Section 564(b)(1) of the Act, 21 U.S.C. section 360bbb-3(b)(1), unless the authorization is terminated or revoked.  Performed at [facility], [address] [place], [place] [address]   Culture, blood (Routine X 2) w Reflex to ID Panel     Status: None (Preliminary result)   Collection Time: 06/27/22 11:22 PM   Specimen: BLOOD  Result Value Ref Range Status   Specimen Description BLOOD LEFT ASSIST CONTROL  Final   Special Requests   Final    BOTTLES DRAWN AEROBIC AND ANAEROBIC Blood Culture adequate volume   Culture   Final    NO GROWTH 2 DAYS Performed at [facility], [address] [place], [place] [address]    Report Status PENDING  Incomplete  Culture, blood (Routine X 2) w Reflex to ID Panel     Status: None (Preliminary result)   Collection Time: 06/27/22 11:22 PM   Specimen: BLOOD  Result Value Ref Range  Status   Specimen Description BLOOD LEFT WRIST  Final   Special Requests   Final    BOTTLES DRAWN AEROBIC AND ANAEROBIC Blood Culture adequate volume   Culture   Final    NO GROWTH 2 DAYS Performed at [facility], [address] [place], [place] [address]    Report Status PENDING  Incomplete    Labs: CBC: Recent Labs  Lab 06/26/22 1233 06/27/22 0420 06/28/22 0547  WBC 7.0 5.2 5.8  NEUTROABS 5.2  --   --   HGB 11.1* 9.7* 9.1*  HCT 34.5* 31.2* 28.8*  MCV 95.6 98.1 97.0  PLT 127* 101* 884*   Basic Metabolic  Panel: Recent Labs  Lab 06/26/22 1233 06/27/22 0420 06/28/22 0547  NA 141 141 142  K 3.6 3.2* 4.5  CL 110 115* 115*  CO2 22 20* 17*  GLUCOSE 87 72 71  BUN 39* 36* 33*  CREATININE 3.39* 3.01* 2.85*  CALCIUM 8.8* 8.2* 8.3*  MG 2.0 2.0 1.8   Liver Function Tests: Recent Labs  Lab 06/26/22 1233  AST 16  ALT 11  ALKPHOS 73  BILITOT 1.3*  PROT 6.1*  ALBUMIN 3.2*   CBG: Recent Labs  Lab 06/28/22 1831 06/28/22 1930  GLUCAP 61* 131*    Discharge time spent: greater than 30 minutes.  This record has been created using Systems analyst. Errors have been sought and corrected,but may not always be located. Such creation errors do not reflect on the standard of care.   Signed: Lorella Nimrod, MD [facility] 06/29/2022

## 2022-07-02 LAB — CULTURE, BLOOD (ROUTINE X 2)
Culture: NO GROWTH
Culture: NO GROWTH
Special Requests: ADEQUATE
Special Requests: ADEQUATE

## 2022-07-04 ENCOUNTER — Other Ambulatory Visit: Payer: Medicare Other

## 2022-07-19 ENCOUNTER — Inpatient Hospital Stay: Payer: Medicare Other | Attending: Oncology | Admitting: Hospice and Palliative Medicine

## 2022-07-19 DIAGNOSIS — C61 Malignant neoplasm of prostate: Secondary | ICD-10-CM

## 2022-07-19 NOTE — Progress Notes (Signed)
Virtual Visit via Telephone Note  I connected with [name] on 07/19/22 at  1:30 PM EST by telephone and verified that I am speaking with the correct person using two identifiers.  Location: Patient: Home Provider: Clinic   I discussed the limitations, risks, security and privacy concerns of performing an evaluation and management service by telephone and the availability of in person appointments. I also discussed with the patient that there may be a patient responsible charge related to this service. The patient expressed understanding and agreed to proceed.   History of Present Illness: [name] is a 89 year old male with multiple medical problems including history of prostate cancer diagnosed in 2006 previously on Lupron. Patient is now on every 6 month Eligard but is not interested in any further imaging, work-up or aggressive treatments. He is referred to palliative care.    Observations/Objective: Patient has several recent hospitalizations but is now home with home health.  Spoke with patient and daughter-in-law.  Reportedly, patient is slowly improving back to baseline.  He denies any symptomatic complaints or concerns at this time.  We did discuss rereferral to home palliative care for support.  We also discussed future hospice involvement.  Assessment and Plan: Prostate cancer -on ADT.  He has follow-up next month with Dr. Tasia Catchings.  Referral to community palliative care  Follow Up Instructions: Follow-up telephone visit 4 months   I discussed the assessment and treatment plan with the patient. The patient was provided an opportunity to ask questions and all were answered. The patient agreed with the plan and demonstrated an understanding of the instructions.   The patient was advised to call back or seek an in-person evaluation if the symptoms worsen or if the condition fails to improve as anticipated.  I provided 10 minutes of non-face-to-face time during this  encounter.   Irean Hong, NP

## 2022-07-25 ENCOUNTER — Telehealth: Payer: Self-pay

## 2022-07-25 NOTE — Telephone Encounter (Signed)
145 pm.  Phone call made to Santiago Glad (dil) to schedule a home visit.  Visit scheduled for next Friday at 1 pm.

## 2022-07-31 ENCOUNTER — Inpatient Hospital Stay: Payer: Medicare Other | Admitting: Diagnostic Neuroimaging

## 2022-08-03 ENCOUNTER — Other Ambulatory Visit: Payer: Medicare Other

## 2022-08-03 DIAGNOSIS — Z515 Encounter for palliative care: Secondary | ICD-10-CM

## 2022-08-03 NOTE — Progress Notes (Signed)
PATIENT NAME: [name] DOB: 1926-07-12 MRN: [id number]  PRIMARY CARE PROVIDER: Pcp, No  RESPONSIBLE PARTY:  Acct ID - Guarantor Home Phone Work Phone Relationship Acct Type  [id number] - Pritt,Jaydon* [phone]  Self P/F     [address], [place], [place] [address]    Arrived at patient's home for scheduled visit.   Family member answered the door and advised they have [place].  Patient's symptoms are much improved from last week but wife now has it.  Card left for family call to and re-schedule next week.      Lorenza Burton, RN

## 2022-08-13 ENCOUNTER — Other Ambulatory Visit: Payer: Self-pay | Admitting: Cardiovascular Disease

## 2022-08-16 ENCOUNTER — Inpatient Hospital Stay: Payer: Medicare Other | Attending: Oncology

## 2022-08-16 ENCOUNTER — Inpatient Hospital Stay (HOSPITAL_BASED_OUTPATIENT_CLINIC_OR_DEPARTMENT_OTHER): Payer: Medicare Other | Admitting: Oncology

## 2022-08-16 ENCOUNTER — Inpatient Hospital Stay: Payer: Medicare Other

## 2022-08-16 ENCOUNTER — Encounter: Payer: Self-pay | Admitting: Oncology

## 2022-08-16 VITALS — BP 92/62 | HR 62 | Temp 96.5°F | Resp 18 | Wt 125.6 lb

## 2022-08-16 DIAGNOSIS — N184 Chronic kidney disease, stage 4 (severe): Secondary | ICD-10-CM

## 2022-08-16 DIAGNOSIS — C61 Malignant neoplasm of prostate: Secondary | ICD-10-CM | POA: Diagnosis present

## 2022-08-16 DIAGNOSIS — Z79899 Other long term (current) drug therapy: Secondary | ICD-10-CM | POA: Insufficient documentation

## 2022-08-16 DIAGNOSIS — Z5111 Encounter for antineoplastic chemotherapy: Secondary | ICD-10-CM | POA: Insufficient documentation

## 2022-08-16 LAB — COMPREHENSIVE METABOLIC PANEL
ALT: 12 U/L (ref 0–44)
AST: 19 U/L (ref 15–41)
Albumin: 3.1 g/dL — ABNORMAL LOW (ref 3.5–5.0)
Alkaline Phosphatase: 89 U/L (ref 38–126)
Anion gap: 9 (ref 5–15)
BUN: 50 mg/dL — ABNORMAL HIGH (ref 8–23)
CO2: 27 mmol/L (ref 22–32)
Calcium: 8.8 mg/dL — ABNORMAL LOW (ref 8.9–10.3)
Chloride: 98 mmol/L (ref 98–111)
Creatinine, Ser: 3.2 mg/dL — ABNORMAL HIGH (ref 0.61–1.24)
GFR, Estimated: 17 mL/min — ABNORMAL LOW (ref >60)
Glucose, Bld: 105 mg/dL — ABNORMAL HIGH (ref 70–99)
Potassium: 4.2 mmol/L (ref 3.5–5.1)
Sodium: 134 mmol/L — ABNORMAL LOW (ref 135–145)
Total Bilirubin: 0.5 mg/dL (ref 0.3–1.2)
Total Protein: 6 g/dL — ABNORMAL LOW (ref 6.5–8.1)

## 2022-08-16 LAB — CBC WITH DIFFERENTIAL/PLATELET
Abs Immature Granulocytes: 0.02 10*3/uL (ref 0.00–0.07)
Basophils Absolute: 0 10*3/uL (ref 0.0–0.1)
Basophils Relative: 1 %
Eosinophils Absolute: 0.1 10*3/uL (ref 0.0–0.5)
Eosinophils Relative: 1 %
HCT: 34.2 % — ABNORMAL LOW (ref 39.0–52.0)
Hemoglobin: 11.3 g/dL — ABNORMAL LOW (ref 13.0–17.0)
Immature Granulocytes: 0 %
Lymphocytes Relative: 38 %
Lymphs Abs: 2.3 10*3/uL (ref 0.7–4.0)
MCH: 30.5 pg (ref 26.0–34.0)
MCHC: 33 g/dL (ref 30.0–36.0)
MCV: 92.4 fL (ref 80.0–100.0)
Monocytes Absolute: 0.4 10*3/uL (ref 0.1–1.0)
Monocytes Relative: 6 %
Neutro Abs: 3.2 10*3/uL (ref 1.7–7.7)
Neutrophils Relative %: 54 %
Platelets: 114 10*3/uL — ABNORMAL LOW (ref 150–400)
RBC: 3.7 MIL/uL — ABNORMAL LOW (ref 4.22–5.81)
RDW: 14.7 % (ref 11.5–15.5)
WBC: 6 10*3/uL (ref 4.0–10.5)
nRBC: 0 % (ref 0.0–0.2)

## 2022-08-16 LAB — PSA: Prostatic Specific Antigen: 0.04 ng/mL (ref 0.00–4.00)

## 2022-08-16 MED ORDER — LEUPROLIDE ACETATE (6 MONTH) 45 MG ~~LOC~~ KIT
45.0000 mg | PACK | Freq: Once | SUBCUTANEOUS | Status: AC
  Administered 2022-08-16: 45 mg via SUBCUTANEOUS
  Filled 2022-08-16: qty 45

## 2022-08-16 NOTE — Progress Notes (Signed)
Hematology/Oncology Progress note [phone] [phone]   CHIEF COMPLAINTS/REASON FOR VISIT:  Follow up for prostate cancer  ASSESSMENT & PLAN:   Malignant neoplasm of prostate (HCC)  History of prostate cancer, risk progressively increased PSA. Patient denies any aggressive work-up or treatments for his prostate cancer. He is only interested in androgen deprivation therapy.  Clinically he is stable.  Denies any pain. Labs reviewed and discussed with patient and daughter in law Proceed with Eligard 45 mg today  Orders Placed This Encounter  Procedures   CBC with Differential/Platelet    Standing Status:   Future    Standing Expiration Date:   08/16/2023   Comprehensive metabolic panel    Standing Status:   Future    Standing Expiration Date:   08/16/2023   PSA    Standing Status:   Future    Standing Expiration Date:   08/17/2023   Follow up in 6 months All questions were answered. The patient knows to call the clinic with any problems, questions or concerns.  Earlie Server, MD, PhD [facility] Hematology Oncology 08/16/2022   HISTORY OF PRESENTING ILLNESS:   [name] is a  89 y.o.  male with PMH listed below was seen in consultation at the request of  Ngetich, Dinah C, NP  for evaluation of prostate cancer.   Patient was diagnosed with prostate cancer in 2006. No original pathology report is available in current EMR.  He follows up with Dr.Chrystal. patient is a poor historian.  Per note, he has history of Gleason 7 adenocarcinoma of prostate, previously on pulsatile leupron therapy.  Patient's last Eligard was in December 2023.  Patient had an appointment in June 2023 and this appointment was rescheduled to today. Patient is not interested in any imaging or work-up or aggressive treatments.  He denies any back pain. He is accompanied by Daughter-in-law today.  INTERVAL HISTORY [name] is a 89 y.o. male who has above history reviewed by me today  presents for follow up visit for prostate cancer treatment.  He is on ADT with Eligard every 6 months.  Dec 2023 hospitalization due to pneumonia, nausea vomiting.  06/26/22 CT abdomen pelvis wo contrast  Extensive diverticulosis is again noted involving the descending and sigmoid colon. There is now noted mild wall thickening of the sigmoid colon suggesting possible infectious or inflammatory colitis. Large sliding-type hiatal hernia.  Stable appearance of severe left renal cortical atrophy with associated severe left hydroureteronephrosis due to multiple distal left ureteral calculi.   Severe osteoarthritis of the left hip.Aortic Atherosclerosis    Review of Systems  Constitutional:  Negative for appetite change, chills, fatigue, fever and unexpected weight change.  HENT:   Negative for hearing loss and voice change.   Eyes:  Negative for eye problems and icterus.  Respiratory:  Negative for chest tightness, cough and shortness of breath.   Cardiovascular:  Negative for chest pain and leg swelling.  Gastrointestinal:  Negative for abdominal distention and abdominal pain.  Endocrine: Negative for hot flashes.  Genitourinary:  Negative for difficulty urinating, dysuria and frequency.   Musculoskeletal:  Positive for arthralgias and back pain.  Skin:  Negative for itching and rash.  Neurological:  Negative for light-headedness and numbness.  Hematological:  Negative for adenopathy. Does not bruise/bleed easily.  Psychiatric/Behavioral:  Negative for confusion.     MEDICAL HISTORY:  Past Medical History:  Diagnosis Date   Anemia    Cardiac disease    CHF (congestive heart  failure) ([place])    Choledocholithiasis    Chronic kidney disease (CKD), stage IV (severe) (HCC)    left   Elevated LFTs    Esophageal stenosis    Hiatal hernia 04/03/2019   large   History of blindness    History of diarrhea    History of gallstones    Hypercholesteremia    Hypertension    Hypothyroidism     Malnutrition (HCC)    Osteoporosis    Prostate cancer ([place])    Sepsis ([place])    Shingles    Shingles    Skin cancer    right cheek    SURGICAL HISTORY: Past Surgical History:  Procedure Laterality Date   CARDIAC CATHETERIZATION  04/13/2014   CT PERC CHOLECYSTOSTOMY  07/20/2016   Placed at [facility] Interventional Radiology   ENDOSCOPIC RETROGRADE CHOLANGIOPANCREATOGRAPHY (ERCP) WITH PROPOFOL N/A 04/06/2019   Procedure: ENDOSCOPIC RETROGRADE CHOLANGIOPANCREATOGRAPHY (ERCP) WITH PROPOFOL;  Surgeon: Lucilla Lame, MD;  Location: [facility] ENDOSCOPY;  Service: Endoscopy;  Laterality: N/A;    SOCIAL HISTORY: Social History   Socioeconomic History   Marital status: Married    Spouse name: Not on file   Number of children: Not on file   Years of education: Not on file   Highest education level: Not on file  Occupational History   Not on file  Tobacco Use   Smoking status: Former    Types: Cigarettes, Cigars    Quit date: 34    Years since quitting: 62.1   Smokeless tobacco: Current    Types: Chew  Vaping Use   Vaping Use: Never used  Substance and Sexual Activity   Alcohol use: Not Currently   Drug use: No   Sexual activity: Not Currently  Other Topics Concern   Not on file  Social History Narrative   Tobacco use, amount per day now: Yes chews tobacco several times.   Past tobacco use, amount per day:   How many years did you use tobacco: 80+ years.   Alcohol use (drinks per week): Past beer drinker.   Diet:   Do you drink/eat things with caffeine: No   Marital status:  Married.                                What year were you married?   Do you live in a house, apartment, assisted living, condo, trailer, etc.? House.   Is it one or more stories? 1   How many persons live in your home? 3   Do you have pets in your home?( please list) No.   Highest Level of education completed? High School   Current or past profession: Teacher, adult education.   Do you exercise?  No.                                 Type and how often?   Do you have a living will? No   Do you have a DNR form?    No                               If not, do you want to discuss one?   Do you have signed POA/HPOA forms?  No  If so, please bring to you appointment      Do you have any difficulty bathing or dressing yourself? Yes   Do you have any difficulty preparing food or eating? Yes   Do you have any difficulty managing your medications? Yes   Do you have any difficulty managing your finances? Yes   Do you have any difficulty affording your medications? No   Social Determinants of Health   Financial Resource Strain: Not on file  Food Insecurity: No Food Insecurity (06/27/2022)   Hunger Vital Sign    Worried About Running Out of Food in the Last Year: Never true    Ran Out of Food in the Last Year: Never true  Transportation Needs: No Transportation Needs (06/27/2022)   PRAPARE - Hydrologist (Medical): No    Lack of Transportation (Non-Medical): No  Physical Activity: Not on file  Stress: Not on file  Social Connections: Not on file  Intimate Partner Violence: Not At Risk (06/27/2022)   Humiliation, Afraid, Rape, and Kick questionnaire    Fear of Current or Ex-Partner: No    Emotionally Abused: No    Physically Abused: No    Sexually Abused: No    FAMILY HISTORY: Family History  Problem Relation Age of Onset   CAD Mother    Cancer Mother    Heart attack Father     ALLERGIES:  is allergic to ativan [lorazepam], penicillin g, amlodipine, lisinopril, spironolactone, and tessalon [benzonatate].  MEDICATIONS:  Current Outpatient Medications  Medication Sig Dispense Refill   aspirin EC 81 MG tablet Take 81 mg by mouth at bedtime.     atorvastatin (LIPITOR) 40 MG tablet Take 40 mg by mouth at bedtime.      Cholecalciferol (VITAMIN D3) 5000 units TABS Take 5,000 Units by mouth daily.     [employer] DIGESTIVE PROBIOTIC 250 MG capsule Take 250 mg  by mouth 2 (two) times daily.     cyanocobalamin (,VITAMIN B-12,) 1000 MCG/ML injection Inject 1,000 mcg into the muscle every 30 (thirty) days. Every 15th of the month.     ELIQUIS 2.5 MG TABS tablet TAKE 1 TABLET BY MOUTH TWICE A DAY 60 tablet 4   ferrous sulfate 325 (65 FE) MG tablet Take 325 mg by mouth 2 (two) times daily.      isosorbide mononitrate (IMDUR) 30 MG 24 hr tablet Take 1 tablet (30 mg total) by mouth daily. 30 tablet 1   levalbuterol (XOPENEX) 1.25 MG/3ML nebulizer solution Take 1.25 mg by nebulization every 8 (eight) hours as needed.     levothyroxine (SYNTHROID) 100 MCG tablet Take 100 mcg by mouth daily before breakfast.     metoprolol succinate (TOPROL-XL) 25 MG 24 hr tablet Take 25 mg by mouth daily.     Omega-3 Fatty Acids (FISH OIL) 1200 MG CAPS Take 1,200 mg by mouth 2 (two) times daily.     ondansetron (ZOFRAN-ODT) 8 MG disintegrating tablet Take 1 tablet (8 mg total) by mouth every 8 (eight) hours as needed for nausea or vomiting. 20 tablet 0   pantoprazole (PROTONIX) 40 MG tablet Take 40 mg by mouth daily.     potassium chloride (KLOR-CON) 10 MEQ tablet Take 10 mEq by mouth daily.     sodium bicarbonate 650 MG tablet Take 2 tablets (1,300 mg total) by mouth 3 (three) times daily. 90 tablet 1   tamsulosin (FLOMAX) 0.4 MG CAPS capsule Take 0.4 mg by mouth at bedtime.  torsemide (DEMADEX) 20 MG tablet Take 40 mg by mouth in the morning and at bedtime.      cefdinir (OMNICEF) 250 MG/5ML suspension Take 6 mLs (300 mg total) by mouth daily. (Patient not taking: Reported on 08/16/2022) 60 mL 0   nitroGLYCERIN (NITROSTAT) 0.4 MG SL tablet Place 0.4 mg under the tongue every 5 (five) minutes as needed for chest pain. (Patient not taking: Reported on 06/26/2022)     No current facility-administered medications for this visit.     PHYSICAL EXAMINATION: ECOG PERFORMANCE STATUS: 3 - Symptomatic, >50% confined to bed Vitals:   08/16/22 1357  BP: 92/62  Pulse: 62  Resp: 18   Temp: (!) 96.5 F (35.8 C)  SpO2: 98%   Filed Weights   08/16/22 1357  Weight: 125 lb 9.6 oz (57 kg)    Physical Exam Constitutional:      General: He is not in acute distress.    Comments: Frail elderly male patient, he sits in a wheelchair.   HENT:     Head: Normocephalic and atraumatic.  Eyes:     General: No scleral icterus. Cardiovascular:     Rate and Rhythm: Normal rate and regular rhythm.     Heart sounds: Normal heart sounds.  Pulmonary:     Effort: Pulmonary effort is normal. No respiratory distress.     Breath sounds: No wheezing.  Abdominal:     General: Bowel sounds are normal. There is no distension.     Palpations: Abdomen is soft.  Musculoskeletal:        General: No deformity. Normal range of motion.     Cervical back: Normal range of motion and neck supple.  Skin:    General: Skin is warm and dry.     Findings: No erythema or rash.  Neurological:     Mental Status: He is alert. Mental status is at baseline.     Cranial Nerves: No cranial nerve deficit.     Coordination: Coordination normal.  Psychiatric:        Mood and Affect: Mood normal.     LABORATORY DATA:  I have reviewed the data as listed    Latest Ref Rng & Units 08/16/2022    1:41 PM 06/28/2022    5:47 AM 06/27/2022    4:20 AM  CBC  WBC 4.0 - 10.5 K/uL 6.0  5.8  5.2   Hemoglobin 13.0 - 17.0 g/dL 11.3  9.1  9.7   Hematocrit 39.0 - 52.0 % 34.2  28.8  31.2   Platelets 150 - 400 K/uL 114  106  101       Latest Ref Rng & Units 08/16/2022    1:41 PM 06/28/2022    5:47 AM 06/27/2022    4:20 AM  CMP  Glucose 70 - 99 mg/dL 105  71  72   BUN 8 - 23 mg/dL 50  33  36   Creatinine 0.61 - 1.24 mg/dL 3.20  2.85  3.01   Sodium 135 - 145 mmol/L 134  142  141   Potassium 3.5 - 5.1 mmol/L 4.2  4.5  3.2   Chloride 98 - 111 mmol/L 98  115  115   CO2 22 - 32 mmol/L '27  17  20   '$ Calcium 8.9 - 10.3 mg/dL 8.8  8.3  8.2   Total Protein 6.5 - 8.1 g/dL 6.0     Total Bilirubin 0.3 - 1.2 mg/dL 0.5      Alkaline Phos 38 -  126 U/L 89     AST 15 - 41 U/L 19     ALT 0 - 44 U/L 12        Iron/TIBC/Ferritin/ %Sat    Component Value Date/Time   IRON 32 (L) 03/09/2018 1450   TIBC 168 (L) 03/09/2018 1450   FERRITIN 950 (H) 04/03/2019 1552   IRONPCTSAT 19 03/09/2018 1450      RADIOGRAPHIC STUDIES: I have personally reviewed the radiological images as listed and agreed with the findings in the report. No results found.

## 2022-08-16 NOTE — Assessment & Plan Note (Addendum)
History of prostate cancer, risk progressively increased PSA. Patient denies any aggressive work-up or treatments for his prostate cancer. He is only interested in androgen deprivation therapy.  Clinically he is stable.  Denies any pain. Labs reviewed and discussed with patient and daughter in law Proceed with Eligard 45 mg today

## 2022-08-16 NOTE — Assessment & Plan Note (Signed)
Encourage oral hydration and avoid nephrotoxins.   

## 2022-08-22 ENCOUNTER — Encounter: Payer: Self-pay | Admitting: Radiation Oncology

## 2022-09-10 ENCOUNTER — Encounter: Payer: Self-pay | Admitting: Cardiovascular Disease

## 2022-09-10 ENCOUNTER — Ambulatory Visit (INDEPENDENT_AMBULATORY_CARE_PROVIDER_SITE_OTHER): Payer: Medicare Other | Admitting: Cardiovascular Disease

## 2022-09-10 ENCOUNTER — Encounter: Payer: Self-pay | Admitting: Radiation Oncology

## 2022-09-10 VITALS — BP 90/60 | HR 53 | Ht 65.0 in | Wt 129.4 lb

## 2022-09-10 DIAGNOSIS — I959 Hypotension, unspecified: Secondary | ICD-10-CM

## 2022-09-10 DIAGNOSIS — I2510 Atherosclerotic heart disease of native coronary artery without angina pectoris: Secondary | ICD-10-CM

## 2022-09-10 DIAGNOSIS — I5032 Chronic diastolic (congestive) heart failure: Secondary | ICD-10-CM

## 2022-09-10 DIAGNOSIS — I10 Essential (primary) hypertension: Secondary | ICD-10-CM

## 2022-09-10 DIAGNOSIS — I5020 Unspecified systolic (congestive) heart failure: Secondary | ICD-10-CM | POA: Diagnosis not present

## 2022-09-10 DIAGNOSIS — E876 Hypokalemia: Secondary | ICD-10-CM

## 2022-09-10 DIAGNOSIS — N184 Chronic kidney disease, stage 4 (severe): Secondary | ICD-10-CM

## 2022-09-10 DIAGNOSIS — Z8679 Personal history of other diseases of the circulatory system: Secondary | ICD-10-CM | POA: Diagnosis not present

## 2022-09-10 MED ORDER — APIXABAN 2.5 MG PO TABS: 2.5000 mg | ORAL_TABLET | Freq: Two times a day (BID) | ORAL | 4 refills | Status: DC

## 2022-09-10 MED ORDER — ISOSORBIDE MONONITRATE ER 30 MG PO TB24: 15.0000 mg | ORAL_TABLET | Freq: Every day | ORAL | 1 refills | Status: DC

## 2022-09-10 NOTE — Patient Instructions (Signed)
Decrease metoprolol 25 mg to 12.5 mg. Decrease isosorbide 30 mg to 15 mg. Labs today

## 2022-09-10 NOTE — Assessment & Plan Note (Signed)
Decrease metoprolol to 12.5 mg daily, isosorbide to 15 mg daily.

## 2022-09-10 NOTE — Progress Notes (Signed)
Cardiology Office Note   Date:  09/10/2022   ID:  [name], DOB [birth year], MRN [id number]  PCP:  Pcp, No  Cardiologist:  Kirk Laming, MD      History of Present Illness: [name] is a 89 y.o. male who presents for  Chief Complaint  Patient presents with   Follow-up    2 mo follow up    Patient in office for 2 month follow up.     Past Medical History:  Diagnosis Date   Anemia    Cardiac disease    CHF (congestive heart failure) (HCC)    Choledocholithiasis    Chronic kidney disease (CKD), stage IV (severe) (HCC)    left   Elevated LFTs    Esophageal stenosis    Hiatal hernia 04/03/2019   large   History of blindness    History of diarrhea    History of gallstones    Hypercholesteremia    Hypertension    Hypothyroidism    Malnutrition (HCC)    Osteoporosis    Prostate cancer ([place])    Sepsis ([place])    Shingles    Shingles    Skin cancer    right cheek     Past Surgical History:  Procedure Laterality Date   CARDIAC CATHETERIZATION  04/13/2014   CT PERC CHOLECYSTOSTOMY  07/20/2016   Placed at [facility] Interventional Radiology   ENDOSCOPIC RETROGRADE CHOLANGIOPANCREATOGRAPHY (ERCP) WITH PROPOFOL N/A 04/06/2019   Procedure: ENDOSCOPIC RETROGRADE CHOLANGIOPANCREATOGRAPHY (ERCP) WITH PROPOFOL;  Surgeon: Lucilla Lame, MD;  Location: [facility] ENDOSCOPY;  Service: Endoscopy;  Laterality: N/A;     Current Outpatient Medications  Medication Sig Dispense Refill   apixaban (ELIQUIS) 2.5 MG TABS tablet Take 1 tablet (2.5 mg total) by mouth 2 (two) times daily. 60 tablet 4   atorvastatin (LIPITOR) 40 MG tablet Take 40 mg by mouth at bedtime.      Cholecalciferol (VITAMIN D3) 5000 units TABS Take 5,000 Units by mouth daily.     [employer] DIGESTIVE PROBIOTIC 250 MG capsule Take 250 mg by mouth 2 (two) times daily.     cyanocobalamin (,VITAMIN B-12,) 1000 MCG/ML injection Inject 1,000 mcg into the muscle every 30 (thirty) days. Every 15th of the month.     ferrous  sulfate 325 (65 FE) MG tablet Take 325 mg by mouth 2 (two) times daily.      levothyroxine (SYNTHROID) 100 MCG tablet Take 100 mcg by mouth daily before breakfast.     metoprolol succinate (TOPROL-XL) 25 MG 24 hr tablet Take 12.5 mg by mouth daily.     nitroGLYCERIN (NITROSTAT) 0.4 MG SL tablet Place 0.4 mg under the tongue every 5 (five) minutes as needed for chest pain.     Omega-3 Fatty Acids (FISH OIL) 1200 MG CAPS Take 1,200 mg by mouth 2 (two) times daily.     pantoprazole (PROTONIX) 40 MG tablet Take 40 mg by mouth daily.     potassium chloride (KLOR-CON) 10 MEQ tablet Take 10 mEq by mouth daily.     sodium bicarbonate 650 MG tablet Take 2 tablets (1,300 mg total) by mouth 3 (three) times daily. 90 tablet 1   tamsulosin (FLOMAX) 0.4 MG CAPS capsule Take 0.4 mg by mouth at bedtime.      torsemide (DEMADEX) 20 MG tablet Take 40 mg by mouth in the morning and at bedtime.      isosorbide mononitrate (IMDUR) 30 MG 24 hr tablet Take 0.5 tablets (15 mg total) by mouth  daily. 15 tablet 1   No current facility-administered medications for this visit.    Allergies:   Ativan [lorazepam], Penicillin g, Amlodipine, Lisinopril, Spironolactone, and Tessalon [benzonatate]    Social History:   reports that he quit smoking about 62 years ago. His smoking use included cigarettes and cigars. His smokeless tobacco use includes chew. He reports that he does not currently use alcohol. He reports that he does not use drugs.   Family History:  family history includes CAD in his mother; Cancer in his mother; Heart attack in his father.    ROS:     Review of Systems  Constitutional: Negative.   HENT: Negative.    Eyes: Negative.   Respiratory: Negative.    Gastrointestinal: Negative.   Genitourinary: Negative.   Musculoskeletal: Negative.   Skin: Negative.   Neurological: Negative.   Endo/Heme/Allergies: Negative.   Psychiatric/Behavioral: Negative.    All other systems reviewed and are negative.    All other systems are reviewed and negative.   PHYSICAL EXAM: VS:  BP 90/60   Pulse (!) 53   Ht '5\' 5"'$  (1.651 m)   Wt 129 lb 6.4 oz (58.7 kg)   SpO2 99%   BMI 21.53 kg/m  , BMI Body mass index is 21.53 kg/m. Last weight:  Wt Readings from Last 3 Encounters:  09/10/22 129 lb 6.4 oz (58.7 kg)  08/16/22 125 lb 9.6 oz (57 kg)  06/29/22 121 lb 11.1 oz (55.2 kg)   Physical Exam Vitals reviewed.  Constitutional:      Appearance: Normal appearance. He is normal weight.  HENT:     Head: Normocephalic.     Nose: Nose normal.     Mouth/Throat:     Mouth: Mucous membranes are moist.  Eyes:     Pupils: Pupils are equal, round, and reactive to light.  Cardiovascular:     Rate and Rhythm: Normal rate and regular rhythm.     Pulses: Normal pulses.     Heart sounds: Normal heart sounds.  Pulmonary:     Effort: Pulmonary effort is normal.  Abdominal:     General: Abdomen is flat. Bowel sounds are normal.  Musculoskeletal:        General: Normal range of motion.     Cervical back: Normal range of motion.  Skin:    General: Skin is warm.  Neurological:     General: No focal deficit present.     Mental Status: He is alert.  Psychiatric:        Mood and Affect: Mood normal.     EKG: none today  Recent Labs: 06/13/2022: TSH 0.888 06/28/2022: Magnesium 1.8 08/16/2022: ALT 12; BUN 50; Creatinine, Ser 3.20; Hemoglobin 11.3; Platelets 114; Potassium 4.2; Sodium 134    Lipid Panel    Component Value Date/Time   CHOL 143 06/13/2022 0400   TRIG 71 06/13/2022 0400   HDL 58 06/13/2022 0400   CHOLHDL 2.5 06/13/2022 0400   VLDL 14 06/13/2022 0400   LDLCALC 71 06/13/2022 0400      Other studies Reviewed: Patient: 4546.0 [name] DOB:  19-Jul-1926  Date:  07/06/2022 11:30 Provider: Neoma Laming MD Encounter: ECHO   Page 2 REASON FOR VISIT  Visit for: Echocardiogram/I 34.0  Sex:   Male         wt=133    lbs.  BP=100/63  Height= 66   inches.   TESTS   Imaging: Echocardiogram:  An echocardiogram in (2-d) mode was performed and in Doppler  mode with color flow velocity mapping was performed. The aortic valve cusps are abnormal 1.6  cm, flow velocity XX123456  m/s, and systolic calculated mean flow gradient 3  mmHg. Mitral valve diastolic peak flow velocity E .538   m/s and E/A ratio 0.6. Aortic root diameter 3.5   cm. The LVOT internal diameter 3.6 cm and flow velocity was abnormal .123456  m/s. LV systolic dimension 99991111  cm, diastolic 123XX123  cm, posterior wall thickness 1.16   cm, fractional shortening 46.5%, and EF 78.6  %. IVS thickness 1.26   cm. LA dimension 4.3 cm. Mitral Valve has Mild to Moderate Regurgitation. Pulmonic Valve has Trace Regurgitation. Tricuspid Valve has Mild Regurgitation.     ASSESSMENT  Technically difficult study due to body habitus.  Normal left ventricular systolic function.  Mild left ventricular hypertrophy with GRADE 1 (relaxation abnormality) diastolic dysfunction.  Normal right ventricular systolic function.  Normal right ventricular diastolic function.  Normal left ventricular wall motion.  Normal right ventricular wall motion.  Trace pulmonary regurgitation.  Mild tricuspid regurgitation.  Normal pulmonary artery pressure.  Mild to Moderate mitral regurgitation.  Fibrocalcified aortic valve   No pericardial effusion.  Mildly dilated Left and RIght atrium  Mild LVH.   THERAPY   Referring physician: Dionisio David  Sonographer: [name].   Kirk Laming MD  Electronically signed by: [name]     Date: 07/06/2022 14:24  Patient: 4546.0 - Fonda Kinder DOB:  06-15-1927  Date:  07/15/2018 07:30 Provider: Neoma Laming MD Encounter: [facility] TEST   Page 2 TESTS    [facility] [address] [place], [place] [address] [phone] STUDY:  Gated Stress/Rest Myocardial Perfusion With Wall Motion, Left Ventricular Ejection Fraction.Persantine Stress  Test. SEX:      Male                                                                                                                                                                                                                   REFERRING PHYSICIAN:  Dr.Eloisa Chokshi Humphrey Rolls  INDICATION FOR STUDY:  CP.                                                                                                                                                                                                                    TECHNIQUE:  Approximately 20 minutes following the intravenous administration of 11.9 mCi of Tc-32mSestamibi after stress testing in a reclined supine position with arms above their head if able to do so, gated SPECT imaging of the heart was performed. After about a 2hr break, the patient was injected intravenously with 31.9 mCi of Tc-941mestamibi.  Approximately 45 minutes later in the same position as stress imaging SPECT rest imaging of the heart was performed.  STRESS BY:  ShNeoma LamingMD PROTOCOL:  Persantine                                                                                     MAX PRED HR: 131                      85%: 111               75%:  98                                                                                                                  RESTING BP: 114/56     RESTING HR:  53   PEAK BP:  103/46     PEAK HR: 63  EXERCISE DURATION:    4 min injection                                            REASON FOR TEST TERMINATION:    Protocol end                                                                                                                              SYMPTOMS:   None                                                                                                                                                                                                           EKG RESULTS:  Sinus bradycardia. 53/min. No significant ST changes with persantine.  PERFUSION/WALL MOTION FINDINGS: EF = 47%. Small mild partially reversible inferior and apical and septal wall defect, mild inferior wall hypokinesis.                                                                           IMPRESSION: Equivocal stress test with mild LV dysfunction, advise CCTA.                                                                                                                                                                                                                                                                                       Kirk Laming, MD Stress Interpreting Physician / Nuclear Interpreting Physician         Kirk Laming MD  Electronically signed by: [name]     Date: 07/18/2018 11:47    ASSESSMENT AND PLAN:    ICD-10-CM   1. Atrial fibrillation, currently in sinus rhythm  Z86.79 apixaban (ELIQUIS) 2.5 MG TABS tablet    2. Benign essential HTN  99991111 Basic metabolic panel    3. Systolic congestive heart failure, unspecified HF chronicity (HCC)  Q000111Q Basic metabolic panel    4. Coronary artery disease involving native coronary artery of native heart without angina pectoris  I25.10 isosorbide mononitrate (IMDUR) 30 MG 24 hr tablet    5. Chronic diastolic (congestive) heart failure (HCC)  I50.32     6.  Chronic kidney disease, stage IV (severe) (HCC)  N18.4     7. Hypokalemia  E87.6     8. Hypotension, unspecified hypotension type  I95.9        Problem List Items Addressed This Visit       Cardiovascular and Mediastinum   Benign essential HTN   Relevant Medications   apixaban (ELIQUIS) 2.5 MG TABS tablet   isosorbide mononitrate (IMDUR) 30 MG 24 hr tablet   Other Relevant Orders  Basic metabolic panel   Chronic diastolic (congestive) heart failure ([place])    Patient denies shortness of breath, edema. Complains of mild productive cough. Continue torsemide twice daily.       Relevant Medications   apixaban (ELIQUIS) 2.5 MG TABS tablet   isosorbide mononitrate (IMDUR) 30 MG 24 hr tablet   CAD (coronary artery disease)   Relevant Medications   apixaban (ELIQUIS) 2.5 MG TABS tablet   isosorbide mononitrate (IMDUR) 30 MG 24 hr tablet   Low BP    Decrease metoprolol to 12.5 mg daily, isosorbide to 15 mg daily.       Relevant Medications   apixaban (ELIQUIS) 2.5 MG TABS tablet   isosorbide mononitrate (IMDUR) 30 MG 24 hr tablet   Congestive heart failure (HCC)   Relevant Medications   apixaban (ELIQUIS) 2.5 MG TABS tablet   isosorbide mononitrate (IMDUR) 30 MG 24 hr tablet   Other Relevant Orders   Basic metabolic panel     Genitourinary   Chronic kidney disease, stage IV (severe) ([place])     Other   Hypokalemia    Patient has been out of his potassium supplement, 10 meq. Taking OTC potassium. Will check labs today.       Atrial fibrillation, currently in sinus rhythm - Primary    On Eliquis for stroke prevention.       Relevant Medications   apixaban (ELIQUIS) 2.5 MG TABS tablet    Disposition:   Return in about 4 weeks (around 10/08/2022).    Total time spent: 30 minutes  Signed,  Kirk Laming, MD  09/10/2022 2:32 [place]

## 2022-09-10 NOTE — Assessment & Plan Note (Signed)
Patient has been out of his potassium supplement, 10 meq. Taking OTC potassium. Will check labs today.

## 2022-09-10 NOTE — Assessment & Plan Note (Signed)
Patient denies shortness of breath, edema. Complains of mild productive cough. Continue torsemide twice daily.

## 2022-09-10 NOTE — Assessment & Plan Note (Signed)
On Eliquis for stroke prevention.

## 2022-09-11 LAB — BASIC METABOLIC PANEL
BUN/Creatinine Ratio: 14 (ref 10–24)
BUN: 51 mg/dL — ABNORMAL HIGH (ref 10–36)
CO2: 20 mmol/L (ref 20–29)
Calcium: 8.3 mg/dL — ABNORMAL LOW (ref 8.6–10.2)
Chloride: 102 mmol/L (ref 96–106)
Creatinine, Ser: 3.66 mg/dL — ABNORMAL HIGH (ref 0.76–1.27)
Glucose: 99 mg/dL (ref 70–99)
Potassium: 3.5 mmol/L (ref 3.5–5.2)
Sodium: 140 mmol/L (ref 134–144)
eGFR: 15 mL/min/{1.73_m2} — ABNORMAL LOW (ref >59)

## 2022-09-12 ENCOUNTER — Ambulatory Visit (INDEPENDENT_AMBULATORY_CARE_PROVIDER_SITE_OTHER): Payer: Medicare Other | Admitting: Urology

## 2022-09-12 ENCOUNTER — Other Ambulatory Visit: Payer: Self-pay

## 2022-09-12 ENCOUNTER — Other Ambulatory Visit
Admission: RE | Admit: 2022-09-12 | Discharge: 2022-09-12 | Disposition: A | Discharge status: Home / Self Care | Payer: Medicare Other | Attending: Urology | Admitting: Urology

## 2022-09-12 ENCOUNTER — Encounter: Payer: Self-pay | Admitting: Radiation Oncology

## 2022-09-12 ENCOUNTER — Encounter: Payer: Self-pay | Admitting: Urology

## 2022-09-12 VITALS — BP 91/55 | HR 46 | Ht 65.0 in | Wt 130.0 lb

## 2022-09-12 DIAGNOSIS — C61 Malignant neoplasm of prostate: Secondary | ICD-10-CM

## 2022-09-12 DIAGNOSIS — R350 Frequency of micturition: Secondary | ICD-10-CM

## 2022-09-12 DIAGNOSIS — R399 Unspecified symptoms and signs involving the genitourinary system: Secondary | ICD-10-CM

## 2022-09-12 LAB — URINALYSIS, COMPLETE (UACMP) WITH MICROSCOPIC
Bilirubin Urine: NEGATIVE
Glucose, UA: NEGATIVE mg/dL
Ketones, ur: NEGATIVE mg/dL
Leukocytes,Ua: NEGATIVE
Nitrite: NEGATIVE
Protein, ur: NEGATIVE mg/dL
Specific Gravity, Urine: 1.01 (ref 1.005–1.030)
pH: 7 (ref 5.0–8.0)

## 2022-09-12 LAB — BLADDER SCAN AMB NON-IMAGING

## 2022-09-12 NOTE — Progress Notes (Signed)
09/12/22 3:56 PM   [name] Mar 30, 1927 [id number]  CC: History of prostate cancer, urinary frequency, atrophic left kidney  HPI: I saw [name] and his daughter for the above issues.  She provides almost the entirety of the history.  He reportedly has a history of prostate cancer that was treated with radiation, and he had biochemical recurrence that is currently managed with ADT by oncology, most recent PSA 0.04 on 08/16/2022.  I do not see any prior urology records regarding his prostate cancer diagnosis, Gleason score, or timing of his radiation.  He also has a chronically atrophic left kidney with left distal ureteral stones that has been stable since at least 2008 and observation was recommended by prior urologist, left kidney appears completely atrophic on recent CT, no evidence of metastatic prostate cancer.  Primary issue today is urinary frequency and nocturia.  This has been a chronic issue.  He is on high-dose torsemide 40 mg twice daily which is a large contributor.  He continues on Flomax.  Urinalysis today benign, PVR normal at 13m.   PMH: Past Medical History:  Diagnosis Date   Anemia    Cardiac disease    CHF ([place]estive heart failure) ([place]    Choledocholithiasis    Chronic kidney disease (CKD), stage IV (severe) (HCC)    left   Elevated LFTs    Esophageal stenosis    Hiatal hernia 04/03/2019   large   History of blindness    History of diarrhea    History of gallstones    Hypercholesteremia    Hypertension    Hypothyroidism    Malnutrition (HCC)    Osteoporosis    [place]    [place]epsis ([place]    Shingles    Shingles    Skin cancer    right cheek    Surgical History: Past Surgical History:  Procedure Laterality Date   CARDIAC CATHETERIZATION  04/13/2014   CT PERC CHOLECYSTOSTOMY  07/20/2016   [facility]   ENDOSCOPIC RETROGRADE CHOLANGIOPANCREATOGRAPHY (ERCP) WITH PROPOFOL N/A 04/06/2019   Procedure:  ENDOSCOPIC RETROGRADE CHOLANGIOPANCREATOGRAPHY (ERCP) WITH PROPOFOL;  Surgeon: WLucilla Lame MD;  [facility]  Service: Endoscopy;  Laterality: N/A;     Family History: Family History  Problem Relation Age of Onset   CAD Mother    Cancer Mother    Heart attack Father     Social History:  reports that he quit smoking about 62 years ago. His smoking use included cigarettes and cigars. He has been exposed to tobacco smoke. His smokeless tobacco use includes chew. He reports that he does not currently use alcohol. He reports that he does not use drugs.  Physical Exam: BP (!) 91/55   Pulse (!) 46   Ht '5\' 5"'$  (1.651 m)   Wt 130 lb (59 kg)   BMI 21.63 kg/m    Constitutional: Frail-appearing elderly male Cardiovascular: No clubbing, cyanosis, or edema. Respiratory: Normal respiratory effort, no increased work of breathing. GI: Abdomen is soft, nontender, nondistended, no abdominal masses   Laboratory Data: Reviewed, see HPI  Pertinent Imaging: I have personally viewed and interpreted the CT abdomen and pelvis with out contrast from December 2023 showing chronically atrophic left kidney with distal ureteral stones, small prostate, normal-appearing bladder.  Assessment & Plan:   Comorbid and frail 89year old male with history of prostate cancer and reported biochemical recurrence managed by ADT through oncology, here today for urinary frequency.  He also has a history  of an atrophic left kidney with left distal ureteral stones that has been chronic since at least 2008.  He is on high-dose torsemide twice daily which is the most likely etiology of his urinary frequency.  He is emptying appropriately and urinalysis is benign.    I think it is reasonable to trial Gemtesa to see if we can improve some of his frequency, however I discussed with the patient and his daughter we need to have realistic expectations moving forward with his age, frailty, history of radiation for  prostate cancer, and high-dose diuretics.  Would avoid cystoscopy unless episode of retention or recurrent infections  Trial of Gemtesa, samples given RTC 6 weeks PVR  I spent 65 total minutes on the day of the encounter including pre-visit review of the medical record, face-to-face time with the patient, and post visit ordering of labs/imaging/tests.   Kirk Madrid, MD 09/12/2022  [facility] [address], [place], [place] [address] [phone]

## 2022-10-01 ENCOUNTER — Other Ambulatory Visit: Payer: Self-pay

## 2022-10-01 DIAGNOSIS — I2510 Atherosclerotic heart disease of native coronary artery without angina pectoris: Secondary | ICD-10-CM

## 2022-10-01 MED ORDER — ISOSORBIDE MONONITRATE ER 30 MG PO TB24: 15.0000 mg | ORAL_TABLET | Freq: Every day | ORAL | 1 refills | Status: DC

## 2022-10-04 ENCOUNTER — Other Ambulatory Visit: Payer: Medicare Other

## 2022-10-04 DIAGNOSIS — Z515 Encounter for palliative care: Secondary | ICD-10-CM

## 2022-10-04 NOTE — Progress Notes (Signed)
PATIENT NAME: [name] DOB: 1927/06/29 MRN: [id number]  PRIMARY CARE PROVIDER: Pcp, No  RESPONSIBLE PARTY:  Acct ID - Guarantor Home Phone Work Phone Relationship Acct Type  [id number] Fonda [phone]  Self P/F     [address], [place], [place] [address]   I connected with Kirk of  [name] on 10/04/22 by telephone and verified that I am speaking with the correct person using two identifiers.   I discussed the limitations of evaluation and management by telemedicine. The patient expressed understanding and agreed to proceed.   Santiago Glad advised patient has been doing well overall.  No residual effects from having COVID in January.  Patient has private caregivers in the home 24/7.  Currently followed by [facility] in the home.   Daughter updated on changes in the program and okay to remain on services.  Visit scheduled for next month.  Daughter requesting resources on depends.  I will follow up with SW regarding this and also advised of [facility] in [place].        Lorenza Burton, RN

## 2022-10-08 ENCOUNTER — Encounter: Payer: Self-pay | Admitting: Cardiovascular Disease

## 2022-10-08 ENCOUNTER — Ambulatory Visit (INDEPENDENT_AMBULATORY_CARE_PROVIDER_SITE_OTHER): Payer: Medicare Other | Admitting: Cardiovascular Disease

## 2022-10-08 VITALS — BP 110/60 | HR 76 | Ht 65.0 in | Wt 128.4 lb

## 2022-10-08 DIAGNOSIS — I2510 Atherosclerotic heart disease of native coronary artery without angina pectoris: Secondary | ICD-10-CM | POA: Diagnosis not present

## 2022-10-08 DIAGNOSIS — Z8679 Personal history of other diseases of the circulatory system: Secondary | ICD-10-CM

## 2022-10-08 DIAGNOSIS — I5032 Chronic diastolic (congestive) heart failure: Secondary | ICD-10-CM | POA: Diagnosis not present

## 2022-10-08 DIAGNOSIS — I10 Essential (primary) hypertension: Secondary | ICD-10-CM | POA: Diagnosis not present

## 2022-10-08 NOTE — Progress Notes (Signed)
Cardiology Office Note   Date:  10/08/2022   ID:  [name], DOB January 12, 1927, MRN [id number]  PCP:  Pcp, No  Cardiologist:  Neoma Laming, MD      History of Present Illness: [name] is a 89 y.o. male who presents for No chief complaint on file.   Patient in office for one month follow up. Denies chest pain, dizziness. Reports shortness of breath only on exertion.     Past Medical History:  Diagnosis Date   Anemia    Cardiac disease    CHF (congestive heart failure)    Choledocholithiasis    Chronic kidney disease (CKD), stage IV (severe)    left   Elevated LFTs    Esophageal stenosis    Hiatal hernia 04/03/2019   large   History of blindness    History of diarrhea    History of gallstones    Hypercholesteremia    Hypertension    Hypothyroidism    Malnutrition    Osteoporosis    Prostate cancer    Sepsis    Shingles    Shingles    Skin cancer    right cheek     Past Surgical History:  Procedure Laterality Date   CARDIAC CATHETERIZATION  04/13/2014   CT PERC CHOLECYSTOSTOMY  07/20/2016   Placed at [facility] Interventional Radiology   ENDOSCOPIC RETROGRADE CHOLANGIOPANCREATOGRAPHY (ERCP) WITH PROPOFOL N/A 04/06/2019   Procedure: ENDOSCOPIC RETROGRADE CHOLANGIOPANCREATOGRAPHY (ERCP) WITH PROPOFOL;  Surgeon: Lucilla Lame, MD;  Location: [facility] ENDOSCOPY;  Service: Endoscopy;  Laterality: N/A;     Current Outpatient Medications  Medication Sig Dispense Refill   apixaban (ELIQUIS) 2.5 MG TABS tablet Take 1 tablet (2.5 mg total) by mouth 2 (two) times daily. 60 tablet 4   atorvastatin (LIPITOR) 40 MG tablet Take 40 mg by mouth at bedtime.      Cholecalciferol (VITAMIN D3) 5000 units TABS Take 5,000 Units by mouth daily.     [employer] DIGESTIVE PROBIOTIC 250 MG capsule Take 250 mg by mouth 2 (two) times daily.     cyanocobalamin (,VITAMIN B-12,) 1000 MCG/ML injection Inject 1,000 mcg into the muscle every 30 (thirty) days. Every 15th of the month.     ferrous  sulfate 325 (65 FE) MG tablet Take 325 mg by mouth 2 (two) times daily.      isosorbide mononitrate (IMDUR) 30 MG 24 hr tablet Take 0.5 tablets (15 mg total) by mouth daily. 45 tablet 1   levothyroxine (SYNTHROID) 100 MCG tablet Take 100 mcg by mouth daily before breakfast.     metoprolol succinate (TOPROL-XL) 25 MG 24 hr tablet Take 12.5 mg by mouth daily.     nitroGLYCERIN (NITROSTAT) 0.4 MG SL tablet Place 0.4 mg under the tongue every 5 (five) minutes as needed for chest pain.     Omega-3 Fatty Acids (FISH OIL) 1200 MG CAPS Take 1,200 mg by mouth 2 (two) times daily.     pantoprazole (PROTONIX) 40 MG tablet Take 40 mg by mouth daily.     potassium chloride (KLOR-CON) 10 MEQ tablet Take 10 mEq by mouth daily.     sodium bicarbonate 650 MG tablet Take 2 tablets (1,300 mg total) by mouth 3 (three) times daily. (Patient taking differently: Take 1,300 mg by mouth 2 (two) times daily.) 90 tablet 1   tamsulosin (FLOMAX) 0.4 MG CAPS capsule Take 0.4 mg by mouth at bedtime.      torsemide (DEMADEX) 20 MG tablet Take 40 mg by mouth  in the morning and at bedtime.      Vibegron (GEMTESA) 75 MG TABS Take 1 tablet by mouth 1 day or 1 dose.     No current facility-administered medications for this visit.    Allergies:   Ativan [lorazepam], Penicillin g, Amlodipine, Lisinopril, Spironolactone, and Tessalon [benzonatate]    Social History:   reports that he quit smoking about 62 years ago. His smoking use included cigarettes and cigars. He has been exposed to tobacco smoke. His smokeless tobacco use includes chew. He reports that he does not currently use alcohol. He reports that he does not use drugs.   Family History:  family history includes CAD in his mother; Cancer in his mother; Heart attack in his father.    ROS:     Review of Systems  Constitutional: Negative.   HENT: Negative.    Eyes: Negative.   Respiratory: Negative.    Cardiovascular: Negative.   Gastrointestinal: Negative.    Genitourinary: Negative.   Musculoskeletal: Negative.   Skin: Negative.   Neurological: Negative.   Endo/Heme/Allergies: Negative.   Psychiatric/Behavioral: Negative.    All other systems reviewed and are negative.   All other systems are reviewed and negative.   PHYSICAL EXAM: VS:  BP 110/60   Pulse 76   Ht 5\' 5"  (1.651 m)   Wt 128 lb 6.4 oz (58.2 kg)   SpO2 (!) 82%   BMI 21.37 kg/m  , BMI Body mass index is 21.37 kg/m. Last weight:  Wt Readings from Last 3 Encounters:  10/08/22 128 lb 6.4 oz (58.2 kg)  09/12/22 130 lb (59 kg)  09/10/22 129 lb 6.4 oz (58.7 kg)   Physical Exam Vitals reviewed.  Constitutional:      Appearance: Normal appearance. He is normal weight.  HENT:     Head: Normocephalic.     Nose: Nose normal.     Mouth/Throat:     Mouth: Mucous membranes are moist.  Eyes:     Pupils: Pupils are equal, round, and reactive to light.  Cardiovascular:     Rate and Rhythm: Normal rate and regular rhythm.     Pulses: Normal pulses.     Heart sounds: Normal heart sounds.  Pulmonary:     Effort: Pulmonary effort is normal.  Abdominal:     General: Abdomen is flat. Bowel sounds are normal.  Musculoskeletal:        General: Normal range of motion.     Cervical back: Normal range of motion.  Skin:    General: Skin is warm.  Neurological:     General: No focal deficit present.     Mental Status: He is alert.  Psychiatric:        Mood and Affect: Mood normal.     EKG: none today  Recent Labs: 06/13/2022: TSH 0.888 06/28/2022: Magnesium 1.8 08/16/2022: ALT 12; Hemoglobin 11.3; Platelets 114 09/10/2022: BUN 51; Creatinine, Ser 3.66; Potassium 3.5; Sodium 140    Lipid Panel    Component Value Date/Time   CHOL 143 06/13/2022 0400   TRIG 71 06/13/2022 0400   HDL 58 06/13/2022 0400   CHOLHDL 2.5 06/13/2022 0400   VLDL 14 06/13/2022 0400   LDLCALC 71 06/13/2022 0400      Other studies Reviewed: none today   ASSESSMENT AND PLAN:    ICD-10-CM   1.  Benign essential HTN  I10     2. Coronary artery disease involving native coronary artery of native heart without angina pectoris  I25.10  3. Chronic diastolic (congestive) heart failure  I50.32     4. Atrial fibrillation, currently in sinus rhythm  Z86.79        Problem List Items Addressed This Visit       Cardiovascular and Mediastinum   Benign essential HTN - Primary    Patient b/p well controlled. Denies dizziness.       Chronic diastolic (congestive) heart failure   CAD (coronary artery disease)     Other   Atrial fibrillation, currently in sinus rhythm     Disposition:   Return in about 3 months (around 01/07/2023).    Total time spent: 30 minutes  Signed,  Neoma Laming, MD  10/08/2022 [place]

## 2022-10-08 NOTE — Assessment & Plan Note (Signed)
Patient b/p well controlled. Denies dizziness.

## 2022-10-10 ENCOUNTER — Encounter: Payer: Self-pay | Admitting: Radiation Oncology

## 2022-10-23 ENCOUNTER — Other Ambulatory Visit: Payer: Self-pay

## 2022-10-23 DIAGNOSIS — N3281 Overactive bladder: Secondary | ICD-10-CM

## 2022-10-23 DIAGNOSIS — R350 Frequency of micturition: Secondary | ICD-10-CM

## 2022-10-23 MED ORDER — GEMTESA 75 MG PO TABS: 1.0000 | ORAL_TABLET | ORAL | 11 refills | Status: DC

## 2022-10-23 NOTE — Telephone Encounter (Signed)
See my chart message

## 2022-10-24 ENCOUNTER — Ambulatory Visit: Payer: Medicare Other | Admitting: Urology

## 2022-10-30 ENCOUNTER — Ambulatory Visit (INDEPENDENT_AMBULATORY_CARE_PROVIDER_SITE_OTHER): Payer: Medicare Other | Admitting: Urology

## 2022-10-30 ENCOUNTER — Encounter: Payer: Self-pay | Admitting: Urology

## 2022-10-30 VITALS — BP 104/58 | HR 65 | Ht 65.0 in | Wt 132.0 lb

## 2022-10-30 DIAGNOSIS — C61 Malignant neoplasm of prostate: Secondary | ICD-10-CM | POA: Diagnosis not present

## 2022-10-30 DIAGNOSIS — R350 Frequency of micturition: Secondary | ICD-10-CM | POA: Diagnosis not present

## 2022-10-30 DIAGNOSIS — N200 Calculus of kidney: Secondary | ICD-10-CM

## 2022-10-30 LAB — BLADDER SCAN AMB NON-IMAGING

## 2022-10-30 MED ORDER — MIRABEGRON ER 50 MG PO TB24: 50.0000 mg | ORAL_TABLET | Freq: Every day | ORAL | 0 refills | Status: DC

## 2022-10-30 MED ORDER — MIRABEGRON ER 50 MG PO TB24: 50.0000 mg | ORAL_TABLET | Freq: Every day | ORAL | 11 refills | Status: DC

## 2022-10-30 NOTE — Progress Notes (Signed)
   10/30/2022 12:58 PM   [name] Aug 08, 1926 [id number]  Reason for visit: Follow up urinary symptoms, history of prostate cancer, chronic left distal ureteral stones and left renal atrophy  HPI: Comorbid and frail 89 year old male with history of prostate cancer and reported biochemical recurrence managed by ADT through oncology(original cancer diagnosis and treatment records unavailable), here for urinary frequency. He also has a history of an atrophic left kidney with left distal ureteral stones that has been chronic since at least 2008.  We previously reviewed unlikely to have any improvement in renal function with any attempted management of his left distal ureteral stones in the setting of his completely atrophic left kidney as well as his other comorbidities.  He is on high-dose torsemide twice daily which is the most likely etiology of his urinary frequency. He is emptying appropriately with normal PVR and urinalysis is benign.  PSA remains low on ADT, most recently 0.04 in February 2024.  At our initial visit in March 2024 we opted for trial of Gemtesa to see if we could improve some of his urinary frequency.  PVR today essentially normal at .  The history is provided entirely from his daughter again today.  She does feel this has helped with his urinary frequency significantly, however the medication was not affordable and they are interested in what other options he might have.  I recommended a trial of Myrbetriq 50 mg daily and risks and benefits were discussed.  He would not be a candidate for anticholinergics with his extreme frailty and high fall risk.  Trial of Myrbetriq 50 mg daily Unfortunately limited options with his age and frailty, and very high-dose torsemide, need to have realistic expectations moving forward RTC 1 year PVR   Sondra Come, MD  [facility] [address] [place], [place] [address] [phone]

## 2022-11-01 ENCOUNTER — Other Ambulatory Visit: Payer: Medicare Other

## 2022-11-01 VITALS — BP 100/60 | HR 60 | Temp 97.4°F | Wt 132.0 lb

## 2022-11-01 DIAGNOSIS — Z515 Encounter for palliative care: Secondary | ICD-10-CM

## 2022-11-01 NOTE — Progress Notes (Signed)
PATIENT NAME: ENEDINO CORIO DOB: Oct 07, 1926 MRN: [id number]  PRIMARY CARE PROVIDER: Pcp, No  RESPONSIBLE PARTY:  Acct ID - Guarantor Home Phone Work Phone Relationship Acct Type  [id number] - Figgs,Christoffer* [phone]  Self P/F     [address], [place], [place] [address]    Home visit completed with patient and private caregiver.  Limited information received on this visit.  Will follow up with daughter to further discuss status.  Patient advised he has a follow up appointment with one of his providers today and requested visit be limited to allow to get ready for appointment.   Appetite:  Remains good per patient.   Functional Status:  Patient is ambulatory without assistive devices.  No recent falls.  Has 24/7 caregivers in the home to assist with safety and ADL's.  Patient requires assistance with dressing and grooming.  Pain:  Generalized aches per patient.        CODE STATUS: Full ADVANCED DIRECTIVES: Yes MOST FORM: No PPS: 40%   PHYSICAL EXAM:   VITALS: Today's Vitals   11/01/22 1333  BP: 100/60  Pulse: 60  Temp: (!) 97.4 F (36.3 C)  SpO2: 99%    LUNGS: clear to auscultation  CARDIAC: Cor RRR}  EXTREMITIES: trace edema to bilateral lower extremities. SKIN: Skin color, texture, turgor normal. No rashes or lesions or mobility and turgor normal  NEURO: positive for memory problems       Truitt Merle, RN

## 2022-11-08 ENCOUNTER — Encounter: Payer: Self-pay | Admitting: Radiation Oncology

## 2022-11-16 ENCOUNTER — Encounter (HOSPITAL_COMMUNITY): Payer: Self-pay

## 2022-11-16 ENCOUNTER — Emergency Department (HOSPITAL_COMMUNITY): Payer: Medicare Other

## 2022-11-16 ENCOUNTER — Inpatient Hospital Stay: Payer: Medicare Other | Attending: Oncology | Admitting: Hospice and Palliative Medicine

## 2022-11-16 ENCOUNTER — Emergency Department (HOSPITAL_COMMUNITY)
Admission: EM | Admit: 2022-11-16 | Discharge: 2022-11-16 | Disposition: A | Discharge status: Home / Self Care | Payer: Medicare Other | Attending: Emergency Medicine | Admitting: Emergency Medicine

## 2022-11-16 ENCOUNTER — Other Ambulatory Visit: Payer: Self-pay

## 2022-11-16 DIAGNOSIS — Z7901 Long term (current) use of anticoagulants: Secondary | ICD-10-CM | POA: Insufficient documentation

## 2022-11-16 DIAGNOSIS — N189 Chronic kidney disease, unspecified: Secondary | ICD-10-CM | POA: Insufficient documentation

## 2022-11-16 DIAGNOSIS — Z8546 Personal history of malignant neoplasm of prostate: Secondary | ICD-10-CM | POA: Diagnosis not present

## 2022-11-16 DIAGNOSIS — I130 Hypertensive heart and chronic kidney disease with heart failure and stage 1 through stage 4 chronic kidney disease, or unspecified chronic kidney disease: Secondary | ICD-10-CM | POA: Diagnosis not present

## 2022-11-16 DIAGNOSIS — F0390 Unspecified dementia without behavioral disturbance: Secondary | ICD-10-CM | POA: Diagnosis not present

## 2022-11-16 DIAGNOSIS — R519 Headache, unspecified: Secondary | ICD-10-CM | POA: Diagnosis present

## 2022-11-16 DIAGNOSIS — I509 Heart failure, unspecified: Secondary | ICD-10-CM | POA: Diagnosis not present

## 2022-11-16 DIAGNOSIS — Z79899 Other long term (current) drug therapy: Secondary | ICD-10-CM | POA: Diagnosis not present

## 2022-11-16 DIAGNOSIS — C61 Malignant neoplasm of prostate: Secondary | ICD-10-CM

## 2022-11-16 DIAGNOSIS — R410 Disorientation, unspecified: Secondary | ICD-10-CM | POA: Diagnosis not present

## 2022-11-16 DIAGNOSIS — R001 Bradycardia, unspecified: Secondary | ICD-10-CM | POA: Insufficient documentation

## 2022-11-16 DIAGNOSIS — I959 Hypotension, unspecified: Secondary | ICD-10-CM

## 2022-11-16 LAB — COMPREHENSIVE METABOLIC PANEL
ALT: 14 U/L (ref 0–44)
AST: 15 U/L (ref 15–41)
Albumin: 2.8 g/dL — ABNORMAL LOW (ref 3.5–5.0)
Alkaline Phosphatase: 59 U/L (ref 38–126)
Anion gap: 8 (ref 5–15)
BUN: 43 mg/dL — ABNORMAL HIGH (ref 8–23)
CO2: 23 mmol/L (ref 22–32)
Calcium: 8.4 mg/dL — ABNORMAL LOW (ref 8.9–10.3)
Chloride: 106 mmol/L (ref 98–111)
Creatinine, Ser: 2.95 mg/dL — ABNORMAL HIGH (ref 0.61–1.24)
GFR, Estimated: 19 mL/min — ABNORMAL LOW (ref >60)
Glucose, Bld: 91 mg/dL (ref 70–99)
Potassium: 3.6 mmol/L (ref 3.5–5.1)
Sodium: 137 mmol/L (ref 135–145)
Total Bilirubin: 0.8 mg/dL (ref 0.3–1.2)
Total Protein: 5.4 g/dL — ABNORMAL LOW (ref 6.5–8.1)

## 2022-11-16 LAB — URINALYSIS, ROUTINE W REFLEX MICROSCOPIC
Bacteria, UA: NONE SEEN
Bilirubin Urine: NEGATIVE
Glucose, UA: NEGATIVE mg/dL
Ketones, ur: NEGATIVE mg/dL
Leukocytes,Ua: NEGATIVE
Nitrite: NEGATIVE
Protein, ur: NEGATIVE mg/dL
Specific Gravity, Urine: 1.009 (ref 1.005–1.030)
pH: 6 (ref 5.0–8.0)

## 2022-11-16 LAB — CBC WITH DIFFERENTIAL/PLATELET
Abs Immature Granulocytes: 0.02 10*3/uL (ref 0.00–0.07)
Basophils Absolute: 0 10*3/uL (ref 0.0–0.1)
Basophils Relative: 1 %
Eosinophils Absolute: 0.1 10*3/uL (ref 0.0–0.5)
Eosinophils Relative: 2 %
HCT: 32.9 % — ABNORMAL LOW (ref 39.0–52.0)
Hemoglobin: 10.6 g/dL — ABNORMAL LOW (ref 13.0–17.0)
Immature Granulocytes: 0 %
Lymphocytes Relative: 40 %
Lymphs Abs: 1.8 10*3/uL (ref 0.7–4.0)
MCH: 30.4 pg (ref 26.0–34.0)
MCHC: 32.2 g/dL (ref 30.0–36.0)
MCV: 94.3 fL (ref 80.0–100.0)
Monocytes Absolute: 0.3 10*3/uL (ref 0.1–1.0)
Monocytes Relative: 6 %
Neutro Abs: 2.3 10*3/uL (ref 1.7–7.7)
Neutrophils Relative %: 51 %
Platelets: 97 10*3/uL — ABNORMAL LOW (ref 150–400)
RBC: 3.49 MIL/uL — ABNORMAL LOW (ref 4.22–5.81)
RDW: 15.8 % — ABNORMAL HIGH (ref 11.5–15.5)
WBC: 4.6 10*3/uL (ref 4.0–10.5)
nRBC: 0 % (ref 0.0–0.2)

## 2022-11-16 NOTE — ED Provider Notes (Signed)
[facility] EMERGENCY DEPARTMENT AT [facility] Provider Note   CSN: [id number] Arrival date & time: 11/16/22  1134     History  Chief Complaint  Patient presents with   Headache    [name] is a 89 y.o. male.  Patient is a 89 year old male with a past medical history of A-fib on Eliquis, hypertension, CHF, prostate cancer, CKD and baseline confusion presenting to the emergency department with headaches and hypotension.  Per EMS, his caregiver called 911 for him having a headache this morning as well as recent low blood pressures.  On their arrival his blood pressure was in the 80s systolic and they gave him a 500 cc fluid bolus with improvement of his blood pressure and his headache.  Patient reported to me that he "has not eaten anything in days".  He otherwise has no acute complaints.  The history is provided by a caregiver and the EMS personnel. The history is limited by the condition of the patient (Level 5 caveat for dementia).  Headache      Home Medications Prior to Admission medications   Medication Sig Start Date End Date Taking? Authorizing Provider  apixaban (ELIQUIS) 2.5 MG TABS tablet Take 1 tablet (2.5 mg total) by mouth 2 (two) times daily. 09/10/22 09/10/23  Laurier Nancy, MD  atorvastatin (LIPITOR) 40 MG tablet Take 40 mg by mouth at bedtime.     [provider]  Cholecalciferol (VITAMIN D3) 5000 units TABS Take 5,000 Units by mouth daily.    [provider]  [employer]S DIGESTIVE PROBIOTIC 250 MG capsule Take 250 mg by mouth 2 (two) times daily. 04/20/22   [provider]  cyanocobalamin (,VITAMIN B-12,) 1000 MCG/ML injection Inject 1,000 mcg into the muscle every 30 (thirty) days. Every 15th of the month. 11/08/21   [provider]  ferrous sulfate 325 (65 FE) MG tablet Take 325 mg by mouth 2 (two) times daily.     [provider]  isosorbide mononitrate (IMDUR) 30 MG 24 hr tablet Take 0.5 tablets (15 mg total) by  mouth daily. 10/01/22   Laurier Nancy, MD  levothyroxine (SYNTHROID) 100 MCG tablet Take 100 mcg by mouth daily before breakfast. 05/18/22   [provider]  metoprolol succinate (TOPROL-XL) 25 MG 24 hr tablet Take 12.5 mg by mouth daily.    [provider]  mirabegron ER (MYRBETRIQ) 50 MG TB24 tablet Take 1 tablet (50 mg total) by mouth daily. 10/30/22   Sondra Come, MD  mirabegron ER (MYRBETRIQ) 50 MG TB24 tablet Take 1 tablet (50 mg total) by mouth daily. 10/30/22   Sondra Come, MD  nitroGLYCERIN (NITROSTAT) 0.4 MG SL tablet Place 0.4 mg under the tongue every 5 (five) minutes as needed for chest pain.    [provider]  Omega-3 Fatty Acids (FISH OIL) 1200 MG CAPS Take 1,200 mg by mouth 2 (two) times daily.    [provider]  pantoprazole (PROTONIX) 40 MG tablet Take 40 mg by mouth daily.    [provider]  potassium chloride (KLOR-CON) 10 MEQ tablet Take 10 mEq by mouth daily.    [provider]  sodium bicarbonate 650 MG tablet Take 2 tablets (1,300 mg total) by mouth 3 (three) times daily. Patient taking differently: Take 1,300 mg by mouth 2 (two) times daily. 06/29/22   Arnetha Courser, MD  tamsulosin (FLOMAX) 0.4 MG CAPS capsule Take 0.4 mg by mouth at bedtime.     [provider]  torsemide (DEMADEX) 20 MG tablet Take 40 mg by mouth in the morning and at bedtime.  10/27/15   [provider]      Allergies    Ativan [lorazepam], Penicillin g, Amlodipine, Lisinopril, Spironolactone, and Tessalon [benzonatate]    Review of Systems   Review of Systems  Neurological:  Positive for headaches.    Physical Exam Updated Vital Signs BP (!) 97/54   Pulse (!) 50   Temp (!) 97.5 F (36.4 C) (Oral)   Resp 17   Ht 5\' 5"  (1.651 m)   Wt 59.9 kg   SpO2 98%   BMI 21.98 kg/m  Physical Exam Vitals and nursing note reviewed.  Constitutional:      Appearance: He is not toxic-appearing.     Comments: Drowsy,  arousable to verbal stimuli, no acute distress  HENT:     Head: Normocephalic and atraumatic.     Mouth/Throat:     Pharynx: Oropharynx is clear.     Comments: Mildly dry mucus membranes Eyes:     Extraocular Movements: Extraocular movements intact.     Pupils: Pupils are equal, round, and reactive to light.  Cardiovascular:     Rate and Rhythm: Regular rhythm. Bradycardia present.  Pulmonary:     Effort: Pulmonary effort is normal.     Breath sounds: Normal breath sounds.  Abdominal:     Palpations: Abdomen is soft.     Tenderness: There is no abdominal tenderness.  Musculoskeletal:        General: Normal range of motion.     Cervical back: Normal range of motion and neck supple.     Comments: 1+ edema to bilateral shins  Skin:    General: Skin is warm and dry.  Neurological:     Mental Status: Mental status is at baseline. He is confused.     GCS: GCS eye subscore is 3. GCS verbal subscore is 5. GCS motor subscore is 6.     Cranial Nerves: No cranial nerve deficit, dysarthria or facial asymmetry.     Sensory: No sensory deficit.     Motor: No weakness.     Comments: Drowsy, arousable to verbal stimuli, following commands  Psychiatric:        Mood and Affect: Mood normal.     ED Results / Procedures / Treatments   Labs (all labs ordered are listed, but only abnormal results are displayed) Labs Reviewed  COMPREHENSIVE METABOLIC PANEL - Abnormal; Notable for the following components:      Result Value   BUN 43 (*)    Creatinine, Ser 2.95 (*)    Calcium 8.4 (*)    Total Protein 5.4 (*)    Albumin 2.8 (*)    GFR, Estimated 19 (*)    All other components within normal limits  CBC WITH DIFFERENTIAL/PLATELET - Abnormal; Notable for the following components:   RBC 3.49 (*)    Hemoglobin 10.6 (*)    HCT 32.9 (*)    RDW 15.8 (*)    Platelets 97 (*)    All other components within normal limits  URINALYSIS, ROUTINE W REFLEX MICROSCOPIC    EKG EKG  Interpretation  Date/Time:  Friday Nov 16 2022 11:34:11 EDT Ventricular Rate:  49 PR Interval:  264 QRS Duration: 146 QT Interval:  494 QTC Calculation: 446 R Axis:   13 Text Interpretation: Sinus bradycardia Prolonged PR interval Right bundle branch block No significant change since last tracing Confirmed by Elayne Snare ([id number]) on  11/16/2022 1:07:14 PM  Radiology No results found.  Procedures Procedures    Medications Ordered in ED Medications - No data to display  ED Course/ Medical Decision Making/ A&P Clinical Course as of 11/16/22 1526  Fri Nov 16, 2022  1308 Patient's caregiver is now at bedside. Reports Bps have been running low in the 100/60s last few days. He has been eating and drinking normally, no recent illnesses. When she got there this morning he was holding his head and cussing at her which is not normal for him. She reports he has not been formally diagnosed with dementia but does have some memory issues. [VK]  1441 No acute abnormality on CTH. UA is pending. BP's have been stable. [VK]  1523 BP initially dropped by 19 mmHg upon sitting up but improved with standing, orthostatics negative. He is stable for discharge home with outpatient follow up. [VK]    Clinical Course User Index [VK] Rexford Maus, DO                             Medical Decision Making This patient presents to the ED with chief complaint(s) of headache, hypotension with pertinent past medical history of CHF, CKD, a fib on Eliquis, baseline confusion which further complicates the presenting complaint. The complaint involves an extensive differential diagnosis and also carries with it a high risk of complications and morbidity.    The differential diagnosis includes the patient's headache on Eliquis considering ICH or mass effect, dehydration, electrolyte abnormality, anemia, orthostatic hypotension, infection, arrhythmia  Additional history obtained: Additional history obtained  from EMS  and caregiver Records reviewed Primary Care Documents and palliative care  ED Course and Reassessment: On patient's arrival to the emergency meant he is drowsy but arousable to verbal stimulation mildly agitated though redirectable and at his neurologic baseline per EMS.  The patient's blood pressures are soft in the 1 teens on arrival and will continue to be closely monitored.  He does have some mild lower extremity edema and does have known CHF and further fluids will be held at this time.  Patient will have labs performed to evaluate for dehydration, electrolyte abnormality or anemia as causes of his hypotension as well as a urine and family will be contacted for further information regarding further workup.  The patient has no other acute complaints at this time.  Independent labs interpretation:  The following labs were independently interpreted: at patient's baseline without acute abnormality  Independent visualization of imaging: - I independently visualized the following imaging with scope of interpretation limited to determining acute life threatening conditions related to emergency care: CTH, CXR, which revealed bronchitis on CXR, otherwise no acute disease  Consultation: - Consulted or discussed management/test interpretation w/ external professional: N/A  Consideration for admission or further workup: Patient has no emergent conditions requiring admission or further work-up at this time and is stable for discharge home with primary care follow-up  Social Determinants of health: N/A    Amount and/or Complexity of Data Reviewed Labs: ordered. Radiology: ordered.          Final Clinical Impression(s) / ED Diagnoses Final diagnoses:  None    Rx / DC Orders ED Discharge Orders     None         Rexford Maus, DO 11/16/22 1605

## 2022-11-16 NOTE — Discharge Instructions (Addendum)
You were seen in the emergency department for your headaches and low blood pressures. Your CT of your head was normal and your labs showed no signs of severe dehydration or anemia and no signs of infection. Your low blood pressures could be due to the medications you are taking and you should HOLD YOUR METOPROLOL for the next several days until you follow up with your primary doctor or your cardiologist as your heart rate was low too. You should return to the emergency department if you have dizziness or pass out, significant worsening headaches, or any other new or concerning symptoms.

## 2022-11-16 NOTE — ED Notes (Signed)
Talk with rn laura and we changed the threshold to 45 hr

## 2022-11-16 NOTE — ED Triage Notes (Signed)
Pt arrives via [facility] from home. Family called d/t pt c/o headache and when they checked his VS he was hypotensive. On EMS arrival pt was 80/40 and was given 300 mL NS bolus and improved to SBP 110's. Pt also bradycardic with EMS in the upper 40's. Pt A+Ox2 (missing time and situation) which EMS reports is baseline. Pt without somatic complaints in triage.

## 2022-11-16 NOTE — Progress Notes (Signed)
Note patient is in ED. Will reschedule.

## 2022-11-22 ENCOUNTER — Encounter: Payer: Self-pay | Admitting: Cardiovascular Disease

## 2022-11-22 ENCOUNTER — Ambulatory Visit (INDEPENDENT_AMBULATORY_CARE_PROVIDER_SITE_OTHER): Payer: Medicare Other | Admitting: Cardiovascular Disease

## 2022-11-22 VITALS — BP 118/60 | HR 63 | Wt 136.0 lb

## 2022-11-22 DIAGNOSIS — Z8679 Personal history of other diseases of the circulatory system: Secondary | ICD-10-CM | POA: Diagnosis not present

## 2022-11-22 DIAGNOSIS — I2510 Atherosclerotic heart disease of native coronary artery without angina pectoris: Secondary | ICD-10-CM

## 2022-11-22 DIAGNOSIS — I639 Cerebral infarction, unspecified: Secondary | ICD-10-CM

## 2022-11-22 DIAGNOSIS — I10 Essential (primary) hypertension: Secondary | ICD-10-CM | POA: Diagnosis not present

## 2022-11-22 DIAGNOSIS — I700 Atherosclerosis of aorta: Secondary | ICD-10-CM

## 2022-11-22 DIAGNOSIS — I493 Ventricular premature depolarization: Secondary | ICD-10-CM

## 2022-11-22 DIAGNOSIS — I5032 Chronic diastolic (congestive) heart failure: Secondary | ICD-10-CM

## 2022-11-22 MED ORDER — TORSEMIDE 10 MG PO TABS: 10.0000 mg | ORAL_TABLET | Freq: Every day | ORAL | 11 refills | Status: DC

## 2022-11-22 MED ORDER — AMIODARONE HCL 200 MG PO TABS: 200.0000 mg | ORAL_TABLET | Freq: Two times a day (BID) | ORAL | 11 refills | Status: DC

## 2022-11-22 NOTE — Assessment & Plan Note (Signed)
Metoprolol was held, that led to atrial fibrillation.  Advised stopping isosorbide and start amiodarone 200 mg p.o. once a day since if patient is in A-fib.

## 2022-11-22 NOTE — Patient Instructions (Signed)
Stop metoprolol and isosorbide.  Start amiodarone 200 mg p.o. once a day and change torsemide from 20 mg to 10 mg once a day.  Prescription has been sent to the pharmacy.  Also get echocardiogram done.

## 2022-11-22 NOTE — Progress Notes (Signed)
Cardiology Office Note   Date:  11/22/2022   ID:  [name], DOB 1927/02/11, MRN [id number]  PCP:  Pcp, No  Cardiologist:  Adrian Blackwater, MD      History of Present Illness: [name] is a 89 y.o. male who presents for  Chief Complaint  Patient presents with   Hospitalization Follow-up    Hospital follow up    Doing well, some swelling of legs, and gets better with walking and compression stockings.Friday had headaches and BPD was 50, heart rate was low also. Went to ER as metoprolol was held.      Past Medical History:  Diagnosis Date   Anemia    Cardiac disease    CHF (congestive heart failure) (HCC)    Choledocholithiasis    Chronic kidney disease (CKD), stage IV (severe) (HCC)    left   Elevated LFTs    Esophageal stenosis    Hiatal hernia 04/03/2019   large   History of blindness    History of diarrhea    History of gallstones    Hypercholesteremia    Hypertension    Hypothyroidism    Malnutrition (HCC)    Osteoporosis    Prostate cancer (HCC)    Sepsis (HCC)    Shingles    Shingles    Skin cancer    right cheek     Past Surgical History:  Procedure Laterality Date   CARDIAC CATHETERIZATION  04/13/2014   CT PERC CHOLECYSTOSTOMY  07/20/2016   Placed at [facility] Interventional Radiology   ENDOSCOPIC RETROGRADE CHOLANGIOPANCREATOGRAPHY (ERCP) WITH PROPOFOL N/A 04/06/2019   Procedure: ENDOSCOPIC RETROGRADE CHOLANGIOPANCREATOGRAPHY (ERCP) WITH PROPOFOL;  Surgeon: Midge Minium, MD;  Location: [facility] ENDOSCOPY;  Service: Endoscopy;  Laterality: N/A;     Current Outpatient Medications  Medication Sig Dispense Refill   amiodarone (PACERONE) 200 MG tablet Take 1 tablet (200 mg total) by mouth 2 (two) times daily. 30 tablet 11   apixaban (ELIQUIS) 2.5 MG TABS tablet Take 1 tablet (2.5 mg total) by mouth 2 (two) times daily. 60 tablet 4   atorvastatin (LIPITOR) 40 MG tablet Take 40 mg by mouth at bedtime.      Cholecalciferol (VITAMIN D3) 5000 units  TABS Take 5,000 Units by mouth daily.     [employer] DIGESTIVE PROBIOTIC 250 MG capsule Take 250 mg by mouth 2 (two) times daily.     cyanocobalamin (,VITAMIN B-12,) 1000 MCG/ML injection Inject 1,000 mcg into the muscle every 30 (thirty) days. Every 15th of the month.     ferrous sulfate 325 (65 FE) MG tablet Take 325 mg by mouth 2 (two) times daily.      levothyroxine (SYNTHROID) 100 MCG tablet Take 100 mcg by mouth daily before breakfast.     mirabegron ER (MYRBETRIQ) 50 MG TB24 tablet Take 1 tablet (50 mg total) by mouth daily. 30 tablet 11   nitroGLYCERIN (NITROSTAT) 0.4 MG SL tablet Place 0.4 mg under the tongue every 5 (five) minutes as needed for chest pain.     Omega-3 Fatty Acids (FISH OIL) 1200 MG CAPS Take 1,200 mg by mouth 2 (two) times daily.     pantoprazole (PROTONIX) 40 MG tablet Take 40 mg by mouth daily.     potassium chloride (KLOR-CON) 10 MEQ tablet Take 10 mEq by mouth daily.     sodium bicarbonate 650 MG tablet Take 2 tablets (1,300 mg total) by mouth 3 (three) times daily. (Patient taking differently: Take 1,300 mg by mouth 2 (two)  times daily.) 90 tablet 1   tamsulosin (FLOMAX) 0.4 MG CAPS capsule Take 0.4 mg by mouth at bedtime.      torsemide (DEMADEX) 10 MG tablet Take 1 tablet (10 mg total) by mouth daily. 30 tablet 11   torsemide (DEMADEX) 20 MG tablet Take 40 mg by mouth in the morning and at bedtime.      mirabegron ER (MYRBETRIQ) 50 MG TB24 tablet Take 1 tablet (50 mg total) by mouth daily. (Patient not taking: Reported on 11/22/2022) 30 tablet 0   No current facility-administered medications for this visit.    Allergies:   Ativan [lorazepam], Penicillin g, Amlodipine, Lisinopril, Spironolactone, and Tessalon [benzonatate]    Social History:   reports that he quit smoking about 62 years ago. His smoking use included cigarettes and cigars. He has been exposed to tobacco smoke. His smokeless tobacco use includes chew. He reports that he does not currently use alcohol. He  reports that he does not use drugs.   Family History:  family history includes CAD in his mother; Cancer in his mother; Heart attack in his father.    ROS:     Review of Systems  Constitutional: Negative.   HENT: Negative.    Eyes: Negative.   Respiratory: Negative.    Gastrointestinal: Negative.   Genitourinary: Negative.   Musculoskeletal: Negative.   Skin: Negative.   Neurological: Negative.   Endo/Heme/Allergies: Negative.   Psychiatric/Behavioral: Negative.    All other systems reviewed and are negative.     All other systems are reviewed and negative.    PHYSICAL EXAM: VS:  BP 118/60   Pulse 63   Wt 136 lb (61.7 kg)   SpO2 96%   BMI 22.63 kg/m  , BMI Body mass index is 22.63 kg/m. Last weight:  Wt Readings from Last 3 Encounters:  11/22/22 136 lb (61.7 kg)  11/16/22 132 lb 0.9 oz (59.9 kg)  11/01/22 132 lb (59.9 kg)     Physical Exam Vitals reviewed.  Constitutional:      Appearance: Normal appearance. He is normal weight.  HENT:     Head: Normocephalic.     Nose: Nose normal.     Mouth/Throat:     Mouth: Mucous membranes are moist.  Eyes:     Pupils: Pupils are equal, round, and reactive to light.  Cardiovascular:     Rate and Rhythm: Normal rate and regular rhythm.     Pulses: Normal pulses.     Heart sounds: Normal heart sounds.  Pulmonary:     Effort: Pulmonary effort is normal.  Abdominal:     General: Abdomen is flat. Bowel sounds are normal.  Musculoskeletal:        General: Normal range of motion.     Cervical back: Normal range of motion.  Skin:    General: Skin is warm.  Neurological:     General: No focal deficit present.     Mental Status: He is alert.  Psychiatric:        Mood and Affect: Mood normal.       EKG:  Afib 90/min RBBB. Nonspecific st changes Recent Labs: 06/13/2022: TSH 0.888 06/28/2022: Magnesium 1.8 11/16/2022: ALT 14; BUN 43; Creatinine, Ser 2.95; Hemoglobin 10.6; Platelets 97; Potassium 3.6; Sodium 137     Lipid Panel    Component Value Date/Time   CHOL 143 06/13/2022 0400   TRIG 71 06/13/2022 0400   HDL 58 06/13/2022 0400   CHOLHDL 2.5 06/13/2022 0400   VLDL 14  06/13/2022 0400   LDLCALC 71 06/13/2022 0400      Other studies Reviewed: Additional studies/ records that were reviewed today include:  Review of the above records demonstrates:       No data to display            ASSESSMENT AND PLAN:    ICD-10-CM   1. Atrial fibrillation, currently in sinus rhythm  Z86.79 PCV ECHOCARDIOGRAM COMPLETE   Patient went to the emergency room where heart rate was in the 60s and blood pressure systolic was 60.  Metoprolol was stopped and now in A-fib.    T    2. Aortic atherosclerosis (HCC)  I70.0 PCV ECHOCARDIOGRAM COMPLETE    3. Benign essential HTN  I10 PCV ECHOCARDIOGRAM COMPLETE    4. Coronary artery disease involving native coronary artery of native heart without angina pectoris  I25.10 PCV ECHOCARDIOGRAM COMPLETE    5. Chronic diastolic (congestive) heart failure (HCC)  I50.32 PCV ECHOCARDIOGRAM COMPLETE    6. Frequent PVCs  I49.3 PCV ECHOCARDIOGRAM COMPLETE    7. Cerebrovascular accident (CVA), unspecified mechanism (HCC)  I63.9 PCV ECHOCARDIOGRAM COMPLETE    8. Chronic diastolic congestive heart failure (HCC)  I50.32 PCV ECHOCARDIOGRAM COMPLETE       Problem List Items Addressed This Visit       Cardiovascular and Mediastinum   Benign essential HTN    Metoprolol was held, that led to atrial fibrillation.  Advised stopping isosorbide and start amiodarone 200 mg p.o. once a day since if patient is in A-fib.      Relevant Medications   torsemide (DEMADEX) 10 MG tablet   amiodarone (PACERONE) 200 MG tablet   Other Relevant Orders   PCV ECHOCARDIOGRAM COMPLETE   Aortic atherosclerosis (HCC)   Relevant Medications   torsemide (DEMADEX) 10 MG tablet   amiodarone (PACERONE) 200 MG tablet   Other Relevant Orders   PCV ECHOCARDIOGRAM COMPLETE   Chronic  diastolic (congestive) heart failure (HCC)   Relevant Medications   torsemide (DEMADEX) 10 MG tablet   amiodarone (PACERONE) 200 MG tablet   Other Relevant Orders   PCV ECHOCARDIOGRAM COMPLETE   CAD (coronary artery disease)   Relevant Medications   torsemide (DEMADEX) 10 MG tablet   amiodarone (PACERONE) 200 MG tablet   Other Relevant Orders   PCV ECHOCARDIOGRAM COMPLETE   Congestive heart failure (HCC)    Because of low blood pressure will change the torsemide to 10 mg once a day.      Relevant Medications   torsemide (DEMADEX) 10 MG tablet   amiodarone (PACERONE) 200 MG tablet   Other Relevant Orders   PCV ECHOCARDIOGRAM COMPLETE   Stroke (HCC)   Relevant Medications   torsemide (DEMADEX) 10 MG tablet   amiodarone (PACERONE) 200 MG tablet   Other Relevant Orders   PCV ECHOCARDIOGRAM COMPLETE   Frequent PVCs   Relevant Medications   torsemide (DEMADEX) 10 MG tablet   amiodarone (PACERONE) 200 MG tablet   Other Relevant Orders   PCV ECHOCARDIOGRAM COMPLETE     Other   Atrial fibrillation, currently in sinus rhythm - Primary   Relevant Orders   PCV ECHOCARDIOGRAM COMPLETE       Disposition:   Return in about 1 week (around 11/29/2022) for echo and f/u.    Total time spent: 30 minutes  Signed,  Adrian Blackwater, MD  11/22/2022 4:03 PM    [facility]

## 2022-11-22 NOTE — Assessment & Plan Note (Signed)
Because of low blood pressure will change the torsemide to 10 mg once a day.

## 2022-11-23 ENCOUNTER — Encounter: Payer: Self-pay | Admitting: Cardiovascular Disease

## 2022-11-29 ENCOUNTER — Ambulatory Visit (INDEPENDENT_AMBULATORY_CARE_PROVIDER_SITE_OTHER): Payer: Medicare Other | Admitting: Cardiovascular Disease

## 2022-11-29 ENCOUNTER — Encounter: Payer: Self-pay | Admitting: Cardiovascular Disease

## 2022-11-29 VITALS — BP 106/58 | HR 68 | Ht 65.0 in | Wt 138.0 lb

## 2022-11-29 DIAGNOSIS — Z8679 Personal history of other diseases of the circulatory system: Secondary | ICD-10-CM | POA: Diagnosis not present

## 2022-11-29 DIAGNOSIS — I10 Essential (primary) hypertension: Secondary | ICD-10-CM

## 2022-11-29 DIAGNOSIS — I2510 Atherosclerotic heart disease of native coronary artery without angina pectoris: Secondary | ICD-10-CM

## 2022-11-29 NOTE — Patient Instructions (Signed)
Decrease amiodarone to 200 mg ONCE daily.

## 2022-11-29 NOTE — Progress Notes (Signed)
Cardiology Office Note   Date:  11/29/2022   ID:  ADWIN CARRAHER, DOB 29-Jun-1927, MRN [id number]  PCP:  Pcp, No  Cardiologist:  Adrian Blackwater, MD      History of Present Illness: [name] is a 89 y.o. male who presents for  Chief Complaint  Patient presents with   Follow-up    1 week follow up    Patient in office for 1 week follow up.     Past Medical History:  Diagnosis Date   Anemia    Cardiac disease    CHF (congestive heart failure) (HCC)    Choledocholithiasis    Chronic kidney disease (CKD), stage IV (severe) (HCC)    left   Elevated LFTs    Esophageal stenosis    Hiatal hernia 04/03/2019   large   History of blindness    History of diarrhea    History of gallstones    Hypercholesteremia    Hypertension    Hypothyroidism    Malnutrition (HCC)    Osteoporosis    Prostate cancer (HCC)    Sepsis (HCC)    Shingles    Shingles    Skin cancer    right cheek     Past Surgical History:  Procedure Laterality Date   CARDIAC CATHETERIZATION  04/13/2014   CT PERC CHOLECYSTOSTOMY  07/20/2016   Placed at [facility] Interventional Radiology   ENDOSCOPIC RETROGRADE CHOLANGIOPANCREATOGRAPHY (ERCP) WITH PROPOFOL N/A 04/06/2019   Procedure: ENDOSCOPIC RETROGRADE CHOLANGIOPANCREATOGRAPHY (ERCP) WITH PROPOFOL;  Surgeon: Midge Minium, MD;  Location: [facility] ENDOSCOPY;  Service: Endoscopy;  Laterality: N/A;     Current Outpatient Medications  Medication Sig Dispense Refill   amiodarone (PACERONE) 200 MG tablet Take 1 tablet (200 mg total) by mouth 2 (two) times daily. 30 tablet 11   apixaban (ELIQUIS) 2.5 MG TABS tablet Take 1 tablet (2.5 mg total) by mouth 2 (two) times daily. 60 tablet 4   atorvastatin (LIPITOR) 40 MG tablet Take 40 mg by mouth at bedtime.      Cholecalciferol (VITAMIN D3) 5000 units TABS Take 5,000 Units by mouth daily.     CVS DIGESTIVE PROBIOTIC 250 MG capsule Take 250 mg by mouth 2 (two) times daily.     cyanocobalamin (,VITAMIN B-12,) 1000  MCG/ML injection Inject 1,000 mcg into the muscle every 30 (thirty) days. Every 15th of the month.     ferrous sulfate 325 (65 FE) MG tablet Take 325 mg by mouth 2 (two) times daily.      levothyroxine (SYNTHROID) 100 MCG tablet Take 100 mcg by mouth daily before breakfast.     mirabegron ER (MYRBETRIQ) 50 MG TB24 tablet Take 1 tablet (50 mg total) by mouth daily. 30 tablet 11   nitroGLYCERIN (NITROSTAT) 0.4 MG SL tablet Place 0.4 mg under the tongue every 5 (five) minutes as needed for chest pain.     Omega-3 Fatty Acids (FISH OIL) 1200 MG CAPS Take 1,200 mg by mouth 2 (two) times daily.     pantoprazole (PROTONIX) 40 MG tablet Take 40 mg by mouth daily.     potassium chloride (KLOR-CON) 10 MEQ tablet Take 10 mEq by mouth daily.     sodium bicarbonate 650 MG tablet Take 2 tablets (1,300 mg total) by mouth 3 (three) times daily. (Patient taking differently: Take 1,300 mg by mouth 2 (two) times daily.) 90 tablet 1   tamsulosin (FLOMAX) 0.4 MG CAPS capsule Take 0.4 mg by mouth at bedtime.  torsemide (DEMADEX) 10 MG tablet Take 1 tablet (10 mg total) by mouth daily. 30 tablet 11   No current facility-administered medications for this visit.    Allergies:   Ativan [lorazepam], Penicillin g, Amlodipine, Lisinopril, Spironolactone, and Tessalon [benzonatate]    Social History:   reports that he quit smoking about 62 years ago. His smoking use included cigarettes and cigars. He has been exposed to tobacco smoke. His smokeless tobacco use includes chew. He reports that he does not currently use alcohol. He reports that he does not use drugs.   Family History:  family history includes CAD in his mother; Cancer in his mother; Heart attack in his father.    ROS:     Review of Systems  Constitutional: Negative.   HENT: Negative.    Eyes: Negative.   Respiratory: Negative.    Cardiovascular: Negative.   Gastrointestinal: Negative.   Genitourinary: Negative.   Musculoskeletal: Negative.    Skin: Negative.   Neurological: Negative.   Endo/Heme/Allergies: Negative.   Psychiatric/Behavioral: Negative.    All other systems reviewed and are negative.    All other systems are reviewed and negative.    PHYSICAL EXAM: VS:  BP (!) 106/58   Pulse 68   Ht 5\' 5"  (1.651 m)   Wt 138 lb (62.6 kg)   SpO2 95%   BMI 22.96 kg/m  , BMI Body mass index is 22.96 kg/m. Last weight:  Wt Readings from Last 3 Encounters:  11/29/22 138 lb (62.6 kg)  11/22/22 136 lb (61.7 kg)  11/16/22 132 lb 0.9 oz (59.9 kg)    Physical Exam Vitals reviewed.  Constitutional:      Appearance: Normal appearance. He is normal weight.  HENT:     Head: Normocephalic.     Nose: Nose normal.     Mouth/Throat:     Mouth: Mucous membranes are moist.  Eyes:     Pupils: Pupils are equal, round, and reactive to light.  Cardiovascular:     Rate and Rhythm: Normal rate and regular rhythm.     Pulses: Normal pulses.     Heart sounds: Normal heart sounds.  Pulmonary:     Effort: Pulmonary effort is normal.  Abdominal:     General: Abdomen is flat. Bowel sounds are normal.  Musculoskeletal:        General: Normal range of motion.     Cervical back: Normal range of motion.  Skin:    General: Skin is warm.  Neurological:     General: No focal deficit present.     Mental Status: He is alert.  Psychiatric:        Mood and Affect: Mood normal.     EKG: sinus rhythm with 1st degree AV block with PACs, RBBB, HR 60 bpm  Recent Labs: 06/13/2022: TSH 0.888 06/28/2022: Magnesium 1.8 11/16/2022: ALT 14; BUN 43; Creatinine, Ser 2.95; Hemoglobin 10.6; Platelets 97; Potassium 3.6; Sodium 137    Lipid Panel    Component Value Date/Time   CHOL 143 06/13/2022 0400   TRIG 71 06/13/2022 0400   HDL 58 06/13/2022 0400   CHOLHDL 2.5 06/13/2022 0400   VLDL 14 06/13/2022 0400   LDLCALC 71 06/13/2022 0400     ASSESSMENT AND PLAN:    ICD-10-CM   1. Coronary artery disease involving native coronary artery of  native heart without angina pectoris  I25.10     2. Benign essential HTN  I10     3. Atrial fibrillation, currently in sinus rhythm  Z86.79        Problem List Items Addressed This Visit       Cardiovascular and Mediastinum   Benign essential HTN    Controlled. Continue same medications.       CAD (coronary artery disease) - Primary     Other   Atrial fibrillation, currently in sinus rhythm    In sinus rhythm on EKG. Decrease amiodarone to 200 mg once daily.         Disposition:   No follow-ups on file.    Total time spent: 30 minutes  Signed,  Adrian Blackwater, MD  11/29/2022 2:45 PM    [facility]

## 2022-11-29 NOTE — Assessment & Plan Note (Signed)
Controlled. Continue same medications. 

## 2022-11-29 NOTE — Assessment & Plan Note (Signed)
In sinus rhythm on EKG. Decrease amiodarone to 200 mg once daily.

## 2022-12-10 ENCOUNTER — Other Ambulatory Visit: Payer: Medicare Other

## 2022-12-13 ENCOUNTER — Ambulatory Visit (INDEPENDENT_AMBULATORY_CARE_PROVIDER_SITE_OTHER): Payer: Medicare Other

## 2022-12-13 DIAGNOSIS — I5032 Chronic diastolic (congestive) heart failure: Secondary | ICD-10-CM

## 2022-12-13 DIAGNOSIS — I422 Other hypertrophic cardiomyopathy: Secondary | ICD-10-CM | POA: Diagnosis not present

## 2022-12-13 DIAGNOSIS — I493 Ventricular premature depolarization: Secondary | ICD-10-CM

## 2022-12-13 DIAGNOSIS — I361 Nonrheumatic tricuspid (valve) insufficiency: Secondary | ICD-10-CM

## 2022-12-13 DIAGNOSIS — I351 Nonrheumatic aortic (valve) insufficiency: Secondary | ICD-10-CM

## 2022-12-13 DIAGNOSIS — I700 Atherosclerosis of aorta: Secondary | ICD-10-CM

## 2022-12-13 DIAGNOSIS — Z8679 Personal history of other diseases of the circulatory system: Secondary | ICD-10-CM

## 2022-12-13 DIAGNOSIS — I10 Essential (primary) hypertension: Secondary | ICD-10-CM

## 2022-12-13 DIAGNOSIS — I2510 Atherosclerotic heart disease of native coronary artery without angina pectoris: Secondary | ICD-10-CM

## 2022-12-13 DIAGNOSIS — I639 Cerebral infarction, unspecified: Secondary | ICD-10-CM

## 2022-12-17 ENCOUNTER — Ambulatory Visit (INDEPENDENT_AMBULATORY_CARE_PROVIDER_SITE_OTHER): Payer: Medicare Other | Admitting: Cardiovascular Disease

## 2022-12-17 ENCOUNTER — Encounter: Payer: Self-pay | Admitting: Cardiovascular Disease

## 2022-12-17 VITALS — BP 104/50 | HR 75 | Ht 66.0 in | Wt 133.8 lb

## 2022-12-17 DIAGNOSIS — I2510 Atherosclerotic heart disease of native coronary artery without angina pectoris: Secondary | ICD-10-CM | POA: Diagnosis not present

## 2022-12-17 DIAGNOSIS — I10 Essential (primary) hypertension: Secondary | ICD-10-CM | POA: Diagnosis not present

## 2022-12-17 DIAGNOSIS — I5032 Chronic diastolic (congestive) heart failure: Secondary | ICD-10-CM

## 2022-12-17 MED ORDER — NITROGLYCERIN 0.4 MG SL SUBL: 0.4000 mg | SUBLINGUAL_TABLET | SUBLINGUAL | 2 refills | Status: DC | PRN

## 2022-12-17 NOTE — Progress Notes (Signed)
Cardiology Office Note   Date:  12/17/2022   ID:  [name], DOB 1926-10-06, MRN [id number]  PCP:  Pcp, No  Cardiologist:  Adrian Blackwater, MD      History of Present Illness: [name] is a 89 y.o. male who presents for  Chief Complaint  Patient presents with   Follow-up    3 month follow up, Echo results.    Patieny in office to discuss echo results. Doing well. No complaints today.     Past Medical History:  Diagnosis Date   Anemia    Cardiac disease    CHF (congestive heart failure) (HCC)    Choledocholithiasis    Chronic kidney disease (CKD), stage IV (severe) (HCC)    left   Elevated LFTs    Esophageal stenosis    Hiatal hernia 04/03/2019   large   History of blindness    History of diarrhea    History of gallstones    Hypercholesteremia    Hypertension    Hypothyroidism    Malnutrition (HCC)    Osteoporosis    Prostate cancer (HCC)    Sepsis (HCC)    Shingles    Shingles    Skin cancer    right cheek     Past Surgical History:  Procedure Laterality Date   CARDIAC CATHETERIZATION  04/13/2014   CT PERC CHOLECYSTOSTOMY  07/20/2016   Placed at [facility] Interventional Radiology   ENDOSCOPIC RETROGRADE CHOLANGIOPANCREATOGRAPHY (ERCP) WITH PROPOFOL N/A 04/06/2019   Procedure: ENDOSCOPIC RETROGRADE CHOLANGIOPANCREATOGRAPHY (ERCP) WITH PROPOFOL;  Surgeon: Midge Minium, MD;  Location: [facility] ENDOSCOPY;  Service: Endoscopy;  Laterality: N/A;     Current Outpatient Medications  Medication Sig Dispense Refill   amiodarone (PACERONE) 200 MG tablet Take 1 tablet (200 mg total) by mouth 2 (two) times daily. (Patient taking differently: Take 200 mg by mouth daily.) 30 tablet 11   apixaban (ELIQUIS) 2.5 MG TABS tablet Take 1 tablet (2.5 mg total) by mouth 2 (two) times daily. 60 tablet 4   atorvastatin (LIPITOR) 40 MG tablet Take 40 mg by mouth at bedtime.      Cholecalciferol (VITAMIN D3) 5000 units TABS Take 5,000 Units by mouth daily.     [employer]S DIGESTIVE  PROBIOTIC 250 MG capsule Take 250 mg by mouth 2 (two) times daily.     cyanocobalamin (,VITAMIN B-12,) 1000 MCG/ML injection Inject 1,000 mcg into the muscle every 30 (thirty) days. Every 15th of the month.     ferrous sulfate 325 (65 FE) MG tablet Take 325 mg by mouth 2 (two) times daily.      levothyroxine (SYNTHROID) 100 MCG tablet Take 100 mcg by mouth daily before breakfast.     mirabegron ER (MYRBETRIQ) 50 MG TB24 tablet Take 1 tablet (50 mg total) by mouth daily. 30 tablet 11   Omega-3 Fatty Acids (FISH OIL) 1200 MG CAPS Take 1,200 mg by mouth 2 (two) times daily.     pantoprazole (PROTONIX) 40 MG tablet Take 40 mg by mouth daily.     potassium chloride (KLOR-CON) 10 MEQ tablet Take 10 mEq by mouth daily.     sodium bicarbonate 650 MG tablet Take 2 tablets (1,300 mg total) by mouth 3 (three) times daily. (Patient taking differently: Take 1,300 mg by mouth 2 (two) times daily.) 90 tablet 1   tamsulosin (FLOMAX) 0.4 MG CAPS capsule Take 0.4 mg by mouth at bedtime.      torsemide (DEMADEX) 10 MG tablet Take 1 tablet (10 mg  total) by mouth daily. 30 tablet 11   nitroGLYCERIN (NITROSTAT) 0.4 MG SL tablet Place 1 tablet (0.4 mg total) under the tongue every 5 (five) minutes as needed for chest pain. 25 tablet 2   No current facility-administered medications for this visit.    Allergies:   Ativan [lorazepam], Penicillin g, Amlodipine, Lisinopril, Spironolactone, and Tessalon [benzonatate]    Social History:   reports that he quit smoking about 62 years ago. His smoking use included cigarettes and cigars. He has been exposed to tobacco smoke. His smokeless tobacco use includes chew. He reports that he does not currently use alcohol. He reports that he does not use drugs.   Family History:  family history includes CAD in his mother; Cancer in his mother; Heart attack in his father.    ROS:     Review of Systems  Constitutional: Negative.   HENT: Negative.    Eyes: Negative.   Respiratory:  Negative.    Cardiovascular:  Positive for leg swelling.  Gastrointestinal: Negative.   Genitourinary: Negative.   Musculoskeletal: Negative.   Skin: Negative.   Neurological: Negative.   Endo/Heme/Allergies: Negative.   Psychiatric/Behavioral: Negative.    All other systems reviewed and are negative.    All other systems are reviewed and negative.    PHYSICAL EXAM: VS:  BP (!) 104/50   Pulse 75   Ht 5\' 6"  (1.676 m)   Wt 133 lb 12.8 oz (60.7 kg)   SpO2 (!) 89%   BMI 21.60 kg/m  , BMI Body mass index is 21.6 kg/m. Last weight:  Wt Readings from Last 3 Encounters:  12/17/22 133 lb 12.8 oz (60.7 kg)  11/29/22 138 lb (62.6 kg)  11/22/22 136 lb (61.7 kg)     Physical Exam Vitals reviewed.  Constitutional:      Appearance: Normal appearance. He is normal weight.  HENT:     Head: Normocephalic.     Nose: Nose normal.     Mouth/Throat:     Mouth: Mucous membranes are moist.  Eyes:     Pupils: Pupils are equal, round, and reactive to light.  Cardiovascular:     Rate and Rhythm: Normal rate and regular rhythm.     Pulses: Normal pulses.     Heart sounds: Normal heart sounds.  Pulmonary:     Effort: Pulmonary effort is normal.  Abdominal:     General: Abdomen is flat. Bowel sounds are normal.  Musculoskeletal:        General: Normal range of motion.     Cervical back: Normal range of motion.     Right lower leg: Edema present.     Left lower leg: Edema present.  Skin:    General: Skin is warm.  Neurological:     General: No focal deficit present.     Mental Status: He is alert.  Psychiatric:        Mood and Affect: Mood normal.     EKG: none today  Recent Labs: 06/13/2022: TSH 0.888 06/28/2022: Magnesium 1.8 11/16/2022: ALT 14; BUN 43; Creatinine, Ser 2.95; Hemoglobin 10.6; Platelets 97; Potassium 3.6; Sodium 137    Lipid Panel    Component Value Date/Time   CHOL 143 06/13/2022 0400   TRIG 71 06/13/2022 0400   HDL 58 06/13/2022 0400   CHOLHDL 2.5  06/13/2022 0400   VLDL 14 06/13/2022 0400   LDLCALC 71 06/13/2022 0400     Other studies Reviewed: echo 12/2022  ASSESSMENT AND PLAN:    ICD-10-CM  1. Benign essential HTN  I10     2. Coronary artery disease involving native coronary artery of native heart without angina pectoris  I25.10     3. Chronic diastolic (congestive) heart failure (HCC)  I50.32        Problem List Items Addressed This Visit       Cardiovascular and Mediastinum   Benign essential HTN - Primary    Well controlled. Denies dizziness.       Relevant Medications   nitroGLYCERIN (NITROSTAT) 0.4 MG SL tablet   Chronic diastolic (congestive) heart failure (HCC)    Denies shortness of breath. Mild bilateral lower extremity edema. Echo 12/2022 normal EF, normal diastolic function.       Relevant Medications   nitroGLYCERIN (NITROSTAT) 0.4 MG SL tablet   CAD (coronary artery disease)    Denies chest pain. No need for isosorbide at this time.       Relevant Medications   nitroGLYCERIN (NITROSTAT) 0.4 MG SL tablet     Disposition:   Return in about 3 months (around 03/19/2023).    Total time spent: 30 minutes  Signed,  Adrian Blackwater, MD  12/17/2022 3:27 PM    [facility]

## 2022-12-17 NOTE — Assessment & Plan Note (Signed)
Denies chest pain. No need for isosorbide at this time.

## 2022-12-17 NOTE — Assessment & Plan Note (Signed)
Well controlled. Denies dizziness.  

## 2022-12-17 NOTE — Assessment & Plan Note (Signed)
Denies shortness of breath. Mild bilateral lower extremity edema. Echo 12/2022 normal EF, normal diastolic function.

## 2022-12-24 ENCOUNTER — Encounter: Payer: Self-pay | Admitting: Cardiovascular Disease

## 2022-12-26 ENCOUNTER — Ambulatory Visit (INDEPENDENT_AMBULATORY_CARE_PROVIDER_SITE_OTHER)
Admission: RE | Admit: 2022-12-26 | Discharge: 2022-12-26 | Disposition: A | Discharge status: Home / Self Care | Payer: Medicare Other | Source: Ambulatory Visit | Attending: Gastroenterology | Admitting: Gastroenterology

## 2022-12-26 ENCOUNTER — Ambulatory Visit (INDEPENDENT_AMBULATORY_CARE_PROVIDER_SITE_OTHER): Payer: Medicare Other | Admitting: Gastroenterology

## 2022-12-26 ENCOUNTER — Encounter: Payer: Self-pay | Admitting: Gastroenterology

## 2022-12-26 VITALS — BP 90/60 | HR 60 | Ht 67.0 in | Wt 133.0 lb

## 2022-12-26 DIAGNOSIS — R197 Diarrhea, unspecified: Secondary | ICD-10-CM

## 2022-12-26 NOTE — Progress Notes (Signed)
Addendum: Reviewed and agree with assessment and management plan. Agree with cross-sectional imaging if lab work negative.  Would repeat imaging before consideration of direct visualization with endoscopy Seraphine Gudiel, Carie Caddy, MD

## 2022-12-26 NOTE — Progress Notes (Signed)
12/26/2022 [name] [id number] Feb 01, 1927   HISTORY OF PRESENT ILLNESS: This is a 89 year old male who is a patient of Dr. Lauro Franklin, last seen here in November 2021.  He is here today with his daughter-in-law who provides history.  She says that he always has softer or looser stools, but he will have issues on and off with very loose to watery stools for extended periods this time, sometimes 2 weeks at a time when he will have diarrhea both day and night.  Has incontinence.  He denies any abdominal pain.  There is no blood in his stool, but they report he is very dark although he does take ferrous sulfate twice a day.  He says appetite is good and his weight looks stable.  CT scan of the abdomen and pelvis with contrast in December 2023 suggested extensive diverticulosis in the descending and sigmoid colon with mild wall thickening of the sigmoid colon suggesting possible infectious or inflammatory colitis.   Past Medical History:  Diagnosis Date   Anemia    Cardiac disease    CHF (congestive heart failure) (HCC)    Choledocholithiasis    Chronic kidney disease (CKD), stage IV (severe) (HCC)    left   Elevated LFTs    Esophageal stenosis    Hiatal hernia 04/03/2019   large   History of blindness    History of diarrhea    History of gallstones    Hypercholesteremia    Hypertension    Hypothyroidism    Malnutrition (HCC)    Osteoporosis    Prostate cancer (HCC)    Sepsis (HCC)    Shingles    Shingles    Skin cancer    right cheek   Past Surgical History:  Procedure Laterality Date   CARDIAC CATHETERIZATION  04/13/2014   CT PERC CHOLECYSTOSTOMY  07/20/2016   Placed at [facility] Interventional Radiology   ENDOSCOPIC RETROGRADE CHOLANGIOPANCREATOGRAPHY (ERCP) WITH PROPOFOL N/A 04/06/2019   Procedure: ENDOSCOPIC RETROGRADE CHOLANGIOPANCREATOGRAPHY (ERCP) WITH PROPOFOL;  Surgeon: Midge Minium, MD;  Location: [facility] ENDOSCOPY;  Service: Endoscopy;  Laterality: N/A;    reports  that he quit smoking about 62 years ago. His smoking use included cigarettes and cigars. He has been exposed to tobacco smoke. His smokeless tobacco use includes chew. He reports that he does not currently use alcohol. He reports that he does not use drugs. family history includes CAD in his mother; Cancer in his mother; Heart attack in his father. Allergies  Allergen Reactions   Ativan [Lorazepam] Other (See Comments)    Hallucinations, combative   Penicillin G Other (See Comments)    Has patient had a PCN reaction causing immediate rash, facial/tongue/throat swelling, SOB or lightheadedness with hypotension: Yes Has patient had a PCN reaction causing severe rash involving mucus membranes or skin necrosis: No Has patient had a PCN reaction that required hospitalization No Has patient had a PCN reaction occurring within the last 10 years: No If all of the above answers are "NO", then may proceed with Cephalosporin use.    Amlodipine Swelling   Lisinopril Other (See Comments)   Spironolactone Other (See Comments)   Tessalon [Benzonatate]       Outpatient Encounter Medications as of 12/26/2022  Medication Sig   amiodarone (PACERONE) 200 MG tablet Take 1 tablet (200 mg total) by mouth 2 (two) times daily. (Patient taking differently: Take 200 mg by mouth daily.)   apixaban (ELIQUIS) 2.5 MG TABS tablet Take 1 tablet (2.5 mg total)  by mouth 2 (two) times daily.   atorvastatin (LIPITOR) 40 MG tablet Take 40 mg by mouth at bedtime.    Cholecalciferol (VITAMIN D3) 5000 units TABS Take 5,000 Units by mouth daily.   [employer] DIGESTIVE PROBIOTIC 250 MG capsule Take 250 mg by mouth 2 (two) times daily.   cyanocobalamin (,VITAMIN B-12,) 1000 MCG/ML injection Inject 1,000 mcg into the muscle every 30 (thirty) days. Every 15th of the month.   ferrous sulfate 325 (65 FE) MG tablet Take 325 mg by mouth 2 (two) times daily.    levothyroxine (SYNTHROID) 100 MCG tablet Take 100 mcg by mouth daily before  breakfast.   mirabegron ER (MYRBETRIQ) 50 MG TB24 tablet Take 1 tablet (50 mg total) by mouth daily.   nitroGLYCERIN (NITROSTAT) 0.4 MG SL tablet Place 1 tablet (0.4 mg total) under the tongue every 5 (five) minutes as needed for chest pain.   Omega-3 Fatty Acids (FISH OIL) 1200 MG CAPS Take 1,200 mg by mouth 2 (two) times daily.   pantoprazole (PROTONIX) 40 MG tablet Take 40 mg by mouth daily.   potassium chloride (KLOR-CON) 10 MEQ tablet Take 10 mEq by mouth daily.   sodium bicarbonate 650 MG tablet Take 2 tablets (1,300 mg total) by mouth 3 (three) times daily. (Patient taking differently: Take 1,300 mg by mouth 2 (two) times daily.)   tamsulosin (FLOMAX) 0.4 MG CAPS capsule Take 0.4 mg by mouth at bedtime.    torsemide (DEMADEX) 10 MG tablet Take 1 tablet (10 mg total) by mouth daily.   No facility-administered encounter medications on file as of 12/26/2022.    REVIEW OF SYSTEMS  : All other systems reviewed and negative except where noted in the History of Present Illness.   PHYSICAL EXAM: BP 90/60   Pulse 60   Ht 5\' 7"  (1.702 m)   Wt 133 lb (60.3 kg)   BMI 20.83 kg/m  General: Well developed white male in no acute distress Head: Normocephalic and atraumatic Eyes:  Sclerae anicteric, conjunctiva pink. Ears: Normal auditory acuity.  HOH. Lungs: Clear throughout to auscultation; no W/R/R. Heart: Regular rate and rhythm; no M/R/G. Musculoskeletal: Symmetrical with no gross deformities  Skin: No lesions on visible extremities Extremities: No edema  Neurological: Alert oriented x 4, grossly non-focal  ASSESSMENT AND PLAN: *89 year old with reports of constant soft to loose stools, but episodes of more significant diarrhea with incontinence occurring during the day and at night.  The incontinence is likely due to poor pelvic floor muscles and poor sphincter tone in the setting of loose stools.  Will first check abdominal x-ray to make sure we are not looking at underlying  constipation and more of an overflow diarrhea.  If the x-ray is negative for such then will proceed with stool culture and C. difficile to rule out infectious source.  If that is negative could consider another CT scan to compare to December at which time he had some sigmoid diverticulitis versus colitis finding.  He has no pain or bloody stool.  Otherwise can try some Questran to help bind and firm stool.  Of note, he follows with palliative care.   CC:  No ref. provider found

## 2022-12-26 NOTE — Patient Instructions (Signed)
Your provider has requested that you have an abdominal x ray before leaving today. Please go to the basement floor to our Radiology department for the test.  Your provider has requested that you go to the basement level for lab work before leaving today. Press "B" on the elevator. The lab is located at the first door on the left as you exit the elevator.  _______________________________________________________  If your blood pressure at your visit was 140/90 or greater, please contact your primary care physician to follow up on this.  _______________________________________________________  If you are age 71 or older, your body mass index should be between 23-30. Your Body mass index is 20.83 kg/m. If this is out of the aforementioned range listed, please consider follow up with your Primary Care Provider.  If you are age 54 or younger, your body mass index should be between 19-25. Your Body mass index is 20.83 kg/m. If this is out of the aformentioned range listed, please consider follow up with your Primary Care Provider.   ________________________________________________________  The [facility] providers would like to encourage you to use [facility] to communicate with providers for non-urgent requests or questions.  Due to long hold times on the telephone, sending your provider a message by [facility] may be a faster and more efficient way to get a response.  Please allow 48 business hours for a response.  Please remember that this is for non-urgent requests.  _______________________________________________________

## 2022-12-27 ENCOUNTER — Other Ambulatory Visit: Payer: Self-pay | Admitting: Cardiovascular Disease

## 2023-01-04 ENCOUNTER — Ambulatory Visit: Payer: Medicare Other

## 2023-01-04 DIAGNOSIS — R197 Diarrhea, unspecified: Secondary | ICD-10-CM

## 2023-01-06 LAB — STOOL CULTURE

## 2023-01-07 LAB — STOOL CULTURE

## 2023-01-08 LAB — STOOL CULTURE: E coli, Shiga toxin Assay: NEGATIVE

## 2023-01-08 LAB — CLOSTRIDIUM DIFFICILE TOXIN B, QUALITATIVE, REAL-TIME PCR: Toxigenic C. Difficile by PCR: NOT DETECTED

## 2023-01-14 ENCOUNTER — Ambulatory Visit: Payer: Medicare Other | Admitting: Cardiovascular Disease

## 2023-01-16 ENCOUNTER — Other Ambulatory Visit: Payer: Self-pay

## 2023-01-16 DIAGNOSIS — Z8679 Personal history of other diseases of the circulatory system: Secondary | ICD-10-CM

## 2023-01-17 ENCOUNTER — Other Ambulatory Visit: Payer: Self-pay

## 2023-01-17 DIAGNOSIS — R935 Abnormal findings on diagnostic imaging of other abdominal regions, including retroperitoneum: Secondary | ICD-10-CM

## 2023-01-17 DIAGNOSIS — R194 Change in bowel habit: Secondary | ICD-10-CM

## 2023-01-17 DIAGNOSIS — R197 Diarrhea, unspecified: Secondary | ICD-10-CM

## 2023-01-17 MED ORDER — APIXABAN 2.5 MG PO TABS
2.5000 mg | ORAL_TABLET | Freq: Two times a day (BID) | ORAL | 4 refills | Status: DC
Start: 2023-01-17 — End: 2023-08-02

## 2023-01-25 ENCOUNTER — Ambulatory Visit
Admission: RE | Admit: 2023-01-25 | Discharge: 2023-01-25 | Disposition: A | Discharge status: Home / Self Care | Payer: Medicare Other | Source: Ambulatory Visit | Attending: Gastroenterology | Admitting: Gastroenterology

## 2023-01-25 ENCOUNTER — Encounter: Payer: Self-pay | Admitting: Radiation Oncology

## 2023-01-25 DIAGNOSIS — R935 Abnormal findings on diagnostic imaging of other abdominal regions, including retroperitoneum: Secondary | ICD-10-CM | POA: Insufficient documentation

## 2023-01-25 DIAGNOSIS — R197 Diarrhea, unspecified: Secondary | ICD-10-CM | POA: Diagnosis not present

## 2023-01-25 DIAGNOSIS — R194 Change in bowel habit: Secondary | ICD-10-CM | POA: Insufficient documentation

## 2023-01-25 MED ORDER — IOHEXOL 9 MG/ML PO SOLN
500.0000 mL | ORAL | Status: AC
  Administered 2023-01-25 (×2): 500 mL via ORAL

## 2023-01-31 ENCOUNTER — Telehealth: Payer: Self-pay

## 2023-01-31 ENCOUNTER — Other Ambulatory Visit: Payer: Self-pay | Admitting: Cardiology

## 2023-01-31 DIAGNOSIS — N184 Chronic kidney disease, stage 4 (severe): Secondary | ICD-10-CM

## 2023-01-31 MED ORDER — METOLAZONE 2.5 MG PO TABS: 2.5000 mg | ORAL_TABLET | Freq: Every day | ORAL | 0 refills | Status: DC

## 2023-01-31 NOTE — Telephone Encounter (Signed)
Patient's daughter-in-law, [name], called and states patient is having a lot of swelling. States his PCP advised him to take two torsemide for 3 days straight. That helped but only for one day, which was last week. Now swelling in legs and feet. He is currently taking torsemide 10 mg daily. Please advise on what to do.

## 2023-02-04 ENCOUNTER — Telehealth: Payer: Self-pay

## 2023-02-04 ENCOUNTER — Other Ambulatory Visit: Payer: Self-pay | Admitting: Cardiology

## 2023-02-04 MED ORDER — TORSEMIDE 20 MG PO TABS: 20.0000 mg | ORAL_TABLET | Freq: Two times a day (BID) | ORAL | 3 refills | Status: DC

## 2023-02-04 NOTE — Telephone Encounter (Signed)
[name] called in and states the patient could not tolerate the metolazone, it kept him up all night and gave him an altered mental status. Should they increase his torsemide or what should they do? He is currently taking torsemide 10 mg daily, but was recently on torsemide 10 mg BID and did much better on that dose.

## 2023-02-04 NOTE — Telephone Encounter (Signed)
Clydie Braun called stating that the patient had bad side effects from the medication that was sent in for the fluid retention and they would like to get torsemide sent in 10mg  bid until the fluid comes off and then go back to once daily

## 2023-02-06 NOTE — Telephone Encounter (Signed)
Spoke with pt daughter Clydie Braun who informed me that pt PCP did a video appt yesterday 02/05/23 and started pt on torsemide 40mg  for 2 to 3 day then for pt to go back to taking 10mg  BID.

## 2023-02-14 ENCOUNTER — Inpatient Hospital Stay: Payer: Medicare Other

## 2023-02-14 ENCOUNTER — Inpatient Hospital Stay: Payer: Medicare Other | Attending: Oncology

## 2023-02-14 ENCOUNTER — Encounter: Payer: Self-pay | Admitting: Oncology

## 2023-02-14 ENCOUNTER — Other Ambulatory Visit: Payer: Self-pay

## 2023-02-14 ENCOUNTER — Inpatient Hospital Stay (HOSPITAL_BASED_OUTPATIENT_CLINIC_OR_DEPARTMENT_OTHER): Payer: Medicare Other | Admitting: Hospice and Palliative Medicine

## 2023-02-14 ENCOUNTER — Inpatient Hospital Stay (HOSPITAL_BASED_OUTPATIENT_CLINIC_OR_DEPARTMENT_OTHER): Payer: Medicare Other | Admitting: Oncology

## 2023-02-14 VITALS — BP 96/55 | HR 62 | Temp 97.2°F | Resp 18 | Wt 132.0 lb

## 2023-02-14 DIAGNOSIS — C61 Malignant neoplasm of prostate: Secondary | ICD-10-CM | POA: Insufficient documentation

## 2023-02-14 DIAGNOSIS — N184 Chronic kidney disease, stage 4 (severe): Secondary | ICD-10-CM

## 2023-02-14 DIAGNOSIS — D631 Anemia in chronic kidney disease: Secondary | ICD-10-CM | POA: Insufficient documentation

## 2023-02-14 DIAGNOSIS — N189 Chronic kidney disease, unspecified: Secondary | ICD-10-CM | POA: Diagnosis not present

## 2023-02-14 DIAGNOSIS — Z5111 Encounter for antineoplastic chemotherapy: Secondary | ICD-10-CM | POA: Insufficient documentation

## 2023-02-14 DIAGNOSIS — Z79899 Other long term (current) drug therapy: Secondary | ICD-10-CM | POA: Insufficient documentation

## 2023-02-14 LAB — COMPREHENSIVE METABOLIC PANEL
ALT: 19 U/L (ref 0–44)
AST: 23 U/L (ref 15–41)
Albumin: 3.3 g/dL — ABNORMAL LOW (ref 3.5–5.0)
Alkaline Phosphatase: 62 U/L (ref 38–126)
Anion gap: 8 (ref 5–15)
BUN: 55 mg/dL — ABNORMAL HIGH (ref 8–23)
CO2: 21 mmol/L — ABNORMAL LOW (ref 22–32)
Calcium: 8.2 mg/dL — ABNORMAL LOW (ref 8.9–10.3)
Chloride: 103 mmol/L (ref 98–111)
Creatinine, Ser: 3.4 mg/dL — ABNORMAL HIGH (ref 0.61–1.24)
GFR, Estimated: 16 mL/min — ABNORMAL LOW (ref >60)
Glucose, Bld: 95 mg/dL (ref 70–99)
Potassium: 4.3 mmol/L (ref 3.5–5.1)
Sodium: 132 mmol/L — ABNORMAL LOW (ref 135–145)
Total Bilirubin: 0.6 mg/dL (ref 0.3–1.2)
Total Protein: 6.1 g/dL — ABNORMAL LOW (ref 6.5–8.1)

## 2023-02-14 LAB — IRON AND TIBC
Iron: 70 ug/dL (ref 45–182)
Saturation Ratios: 39 % (ref 17.9–39.5)
TIBC: 178 ug/dL — ABNORMAL LOW (ref 250–450)
UIBC: 108 ug/dL

## 2023-02-14 LAB — CBC WITH DIFFERENTIAL/PLATELET
Abs Immature Granulocytes: 0.02 10*3/uL (ref 0.00–0.07)
Basophils Absolute: 0 10*3/uL (ref 0.0–0.1)
Basophils Relative: 0 %
Eosinophils Absolute: 0 10*3/uL (ref 0.0–0.5)
Eosinophils Relative: 1 %
HCT: 28.5 % — ABNORMAL LOW (ref 39.0–52.0)
Hemoglobin: 9.5 g/dL — ABNORMAL LOW (ref 13.0–17.0)
Immature Granulocytes: 1 %
Lymphocytes Relative: 38 %
Lymphs Abs: 1.4 10*3/uL (ref 0.7–4.0)
MCH: 30.7 pg (ref 26.0–34.0)
MCHC: 33.3 g/dL (ref 30.0–36.0)
MCV: 92.2 fL (ref 80.0–100.0)
Monocytes Absolute: 0.2 10*3/uL (ref 0.1–1.0)
Monocytes Relative: 6 %
Neutro Abs: 2.1 10*3/uL (ref 1.7–7.7)
Neutrophils Relative %: 54 %
Platelets: 105 10*3/uL — ABNORMAL LOW (ref 150–400)
RBC: 3.09 MIL/uL — ABNORMAL LOW (ref 4.22–5.81)
RDW: 16.9 % — ABNORMAL HIGH (ref 11.5–15.5)
WBC: 3.8 10*3/uL — ABNORMAL LOW (ref 4.0–10.5)
nRBC: 0 % (ref 0.0–0.2)

## 2023-02-14 LAB — RETIC PANEL
Immature Retic Fract: 5.1 % (ref 2.3–15.9)
RBC.: 3.05 MIL/uL — ABNORMAL LOW (ref 4.22–5.81)
Retic Count, Absolute: 50 10*3/uL (ref 19.0–186.0)
Retic Ct Pct: 1.6 % (ref 0.4–3.1)
Reticulocyte Hemoglobin: 33.9 pg (ref >27.9)

## 2023-02-14 LAB — PSA: Prostatic Specific Antigen: 0.03 ng/mL (ref 0.00–4.00)

## 2023-02-14 LAB — FERRITIN: Ferritin: 272 ng/mL (ref 24–336)

## 2023-02-14 MED ORDER — LEUPROLIDE ACETATE (6 MONTH) 45 MG ~~LOC~~ KIT
45.0000 mg | PACK | Freq: Once | SUBCUTANEOUS | Status: AC
  Administered 2023-02-14: 45 mg via SUBCUTANEOUS
  Filled 2023-02-14: qty 45

## 2023-02-14 NOTE — Progress Notes (Signed)
Hematology/Oncology Progress note [phone] [phone]) [phone]   CHIEF COMPLAINTS/REASON FOR VISIT:  Follow up for prostate cancer  ASSESSMENT & PLAN:   Malignant neoplasm of prostate (HCC)  History of prostate cancer, risk progressively increased PSA. Patient denies any aggressive work-up or treatments for his prostate cancer. He is only interested in androgen deprivation therapy.  Clinically he is stable.  Denies any pain. Labs reviewed and discussed with patient and daughter in law Proceed with Eligard 45 mg today  CKD (chronic kidney disease), stage IV (HCC) Encourage oral hydration and avoid nephrotoxins.  Follow up with nephrology  Anemia of chronic kidney failure, unspecified stage Lab Results  Component Value Date   HGB 9.5 (L) 02/14/2023   TIBC 178 (L) 02/14/2023   IRONPCTSAT 39 02/14/2023   FERRITIN 272 02/14/2023    Ferritin is at goal >200 Recommend erythropoietin replacement therapy.  Rationale and potential side effects were reviewed and discussed with patient and her daughter-in-law [name]. They agree with the plan. Recommend Retacrit 10,000 unit every 4 weeks  Orders Placed This Encounter  Procedures   Iron and TIBC    Standing Status:   Future    Number of Occurrences:   1    Standing Expiration Date:   02/14/2024   Ferritin    Standing Status:   Future    Number of Occurrences:   1    Standing Expiration Date:   02/14/2024   Retic Panel    Standing Status:   Future    Number of Occurrences:   1    Standing Expiration Date:   02/14/2024   Follow up in 2 months All questions were answered. The patient knows to call the clinic with any problems, questions or concerns.  [name], MD, PhD [facility] Hematology Oncology 02/14/2023   HISTORY OF PRESENTING ILLNESS:   [name] is a  89 y.o.  male with PMH listed below was seen in consultation at the request of  Ngetich, Dinah C, NP  for evaluation of prostate cancer.   Patient was  diagnosed with prostate cancer in 2006. No original pathology report is available in current EMR.  He follows up with Dr.Chrystal. patient is a poor historian.  Per note, he has history of Gleason 7 adenocarcinoma of prostate, previously on pulsatile leupron therapy.  Patient's last Eligard was in December 2023.  Patient had an appointment in June 2023 and this appointment was rescheduled to today. Patient is not interested in any imaging or work-up or aggressive treatments.  He denies any back pain.   06/26/22 CT abdomen pelvis wo contrast  Extensive diverticulosis is again noted involving the descending and sigmoid colon. There is now noted mild wall thickening of the sigmoid colon suggesting possible infectious or inflammatory colitis. Large sliding-type hiatal hernia.  Stable appearance of severe left renal cortical atrophy with associated severe left hydroureteronephrosis due to multiple distal left ureteral calculi.   Severe osteoarthritis of the left hip.Aortic Atherosclerosis   INTERVAL HISTORY [name] is a 89 y.o. male who has above history reviewed by me today presents for follow up visit for prostate cancer treatment.  He is on ADT with Eligard every 6 months.  + worse of fatigue.    Review of Systems  Constitutional:  Negative for appetite change, chills, fatigue, fever and unexpected weight change.  HENT:   Negative for hearing loss and voice change.   Eyes:  Negative for eye problems and icterus.  Respiratory:  Negative for  chest tightness, cough and shortness of breath.   Cardiovascular:  Negative for chest pain and leg swelling.  Gastrointestinal:  Negative for abdominal distention and abdominal pain.  Endocrine: Negative for hot flashes.  Genitourinary:  Negative for difficulty urinating, dysuria and frequency.   Musculoskeletal:  Positive for arthralgias and back pain.  Skin:  Negative for itching and rash.  Neurological:  Negative for light-headedness and  numbness.  Hematological:  Negative for adenopathy. Does not bruise/bleed easily.  Psychiatric/Behavioral:  Negative for confusion.     MEDICAL HISTORY:  Past Medical History:  Diagnosis Date   Anemia    Cardiac disease    CHF (congestive heart failure) (HCC)    Choledocholithiasis    Chronic kidney disease (CKD), stage IV (severe) (HCC)    left   Elevated LFTs    Esophageal stenosis    Hiatal hernia 04/03/2019   large   History of blindness    History of diarrhea    History of gallstones    Hypercholesteremia    Hypertension    Hypothyroidism    Malnutrition (HCC)    Osteoporosis    Prostate cancer (HCC)    Sepsis (HCC)    Shingles    Shingles    Skin cancer    right cheek    SURGICAL HISTORY: Past Surgical History:  Procedure Laterality Date   CARDIAC CATHETERIZATION  04/13/2014   CT PERC CHOLECYSTOSTOMY  07/20/2016   Placed at [facility] Interventional Radiology   ENDOSCOPIC RETROGRADE CHOLANGIOPANCREATOGRAPHY (ERCP) WITH PROPOFOL N/A 04/06/2019   Procedure: ENDOSCOPIC RETROGRADE CHOLANGIOPANCREATOGRAPHY (ERCP) WITH PROPOFOL;  Surgeon: Midge Minium, MD;  Location: [facility] ENDOSCOPY;  Service: Endoscopy;  Laterality: N/A;    SOCIAL HISTORY: Social History   Socioeconomic History   Marital status: Married    Spouse name: Not on file   Number of children: Not on file   Years of education: Not on file   Highest education level: Not on file  Occupational History   Not on file  Tobacco Use   Smoking status: Former    Current packs/day: 0.00    Types: Cigarettes, Cigars    Quit date: 88    Years since quitting: 62.6    Passive exposure: Past   Smokeless tobacco: Current    Types: Chew  Vaping Use   Vaping status: Never Used  Substance and Sexual Activity   Alcohol use: Not Currently   Drug use: No   Sexual activity: Not Currently  Other Topics Concern   Not on file  Social History Narrative   Tobacco use, amount per day now: Yes chews tobacco several times.    Past tobacco use, amount per day:   How many years did you use tobacco: 80+ years.   Alcohol use (drinks per week): Past beer drinker.   Diet:   Do you drink/eat things with caffeine: No   Marital status:  Married.                                What year were you married?   Do you live in a house, apartment, assisted living, condo, trailer, etc.? House.   Is it one or more stories? 1   How many persons live in your home? 3   Do you have pets in your home?( please list) No.   Highest Level of education completed? High School   Current or past profession: Publishing rights manager.   Do  you exercise?  No.                                Type and how often?   Do you have a living will? No   Do you have a DNR form?    No                               If not, do you want to discuss one?   Do you have signed POA/HPOA forms?  No                   If so, please bring to you appointment      Do you have any difficulty bathing or dressing yourself? Yes   Do you have any difficulty preparing food or eating? Yes   Do you have any difficulty managing your medications? Yes   Do you have any difficulty managing your finances? Yes   Do you have any difficulty affording your medications? No   Social Determinants of Health   Financial Resource Strain: Not on file  Food Insecurity: No Food Insecurity (06/27/2022)   Hunger Vital Sign    Worried About Running Out of Food in the Last Year: Never true    Ran Out of Food in the Last Year: Never true  Transportation Needs: No Transportation Needs (06/27/2022)   PRAPARE - Administrator, Civil Service (Medical): No    Lack of Transportation (Non-Medical): No  Physical Activity: Not on file  Stress: Not on file  Social Connections: Not on file  Intimate Partner Violence: Not At Risk (06/27/2022)   Humiliation, Afraid, Rape, and Kick questionnaire    Fear of Current or Ex-Partner: No    Emotionally Abused: No    Physically Abused: No     Sexually Abused: No    FAMILY HISTORY: Family History  Problem Relation Age of Onset   CAD Mother    Cancer Mother    Heart attack Father     ALLERGIES:  is allergic to ativan [lorazepam], penicillin g, amlodipine, lisinopril, spironolactone, and tessalon [benzonatate].  MEDICATIONS:  Current Outpatient Medications  Medication Sig Dispense Refill   amiodarone (PACERONE) 200 MG tablet Take 1 tablet (200 mg total) by mouth 2 (two) times daily. (Patient taking differently: Take 200 mg by mouth daily.) 30 tablet 11   apixaban (ELIQUIS) 2.5 MG TABS tablet Take 1 tablet (2.5 mg total) by mouth 2 (two) times daily. 60 tablet 4   atorvastatin (LIPITOR) 40 MG tablet Take 40 mg by mouth at bedtime.      Cholecalciferol (VITAMIN D3) 5000 units TABS Take 5,000 Units by mouth daily.     [employer] DIGESTIVE PROBIOTIC 250 MG capsule Take 250 mg by mouth 2 (two) times daily.     cyanocobalamin (,VITAMIN B-12,) 1000 MCG/ML injection Inject 1,000 mcg into the muscle every 30 (thirty) days. Every 15th of the month.     ferrous sulfate 325 (65 FE) MG tablet Take 325 mg by mouth 2 (two) times daily.      levothyroxine (SYNTHROID) 100 MCG tablet Take 100 mcg by mouth daily before breakfast.     mirabegron ER (MYRBETRIQ) 50 MG TB24 tablet Take 1 tablet (50 mg total) by mouth daily. 30 tablet 11   nitroGLYCERIN (NITROSTAT) 0.4 MG SL tablet Place 1 tablet (0.4 mg total) under the  tongue every 5 (five) minutes as needed for chest pain. 25 tablet 2   Omega-3 Fatty Acids (FISH OIL) 1200 MG CAPS Take 1,200 mg by mouth 2 (two) times daily.     pantoprazole (PROTONIX) 40 MG tablet Take 40 mg by mouth daily.     potassium chloride (KLOR-CON) 10 MEQ tablet Take 10 mEq by mouth daily.     sodium bicarbonate 650 MG tablet Take 2 tablets (1,300 mg total) by mouth 3 (three) times daily. (Patient taking differently: Take 1,300 mg by mouth 2 (two) times daily.) 90 tablet 1   tamsulosin (FLOMAX) 0.4 MG CAPS capsule Take 0.4 mg by  mouth at bedtime.      torsemide (DEMADEX) 20 MG tablet Take 20 mg by mouth daily.     No current facility-administered medications for this visit.     PHYSICAL EXAMINATION: ECOG PERFORMANCE STATUS: 3 - Symptomatic, >50% confined to bed Vitals:   02/14/23 1338  BP: (!) 96/55  Pulse: 62  Resp: 18  Temp: (!) 97.2 F (36.2 C)  SpO2: 100%   Filed Weights   02/14/23 1338  Weight: 132 lb (59.9 kg)    Physical Exam Constitutional:      General: He is not in acute distress.    Comments: Frail elderly male patient, he sits in a wheelchair.   HENT:     Head: Normocephalic and atraumatic.  Eyes:     General: No scleral icterus. Cardiovascular:     Rate and Rhythm: Normal rate and regular rhythm.     Heart sounds: Normal heart sounds.  Pulmonary:     Effort: Pulmonary effort is normal. No respiratory distress.     Breath sounds: No wheezing.  Abdominal:     General: Bowel sounds are normal. There is no distension.     Palpations: Abdomen is soft.  Musculoskeletal:        General: No deformity. Normal range of motion.     Cervical back: Normal range of motion and neck supple.  Skin:    General: Skin is warm and dry.     Findings: No erythema or rash.  Neurological:     Mental Status: He is alert. Mental status is at baseline.     Cranial Nerves: No cranial nerve deficit.     Coordination: Coordination normal.  Psychiatric:        Mood and Affect: Mood normal.     LABORATORY DATA:  I have reviewed the data as listed    Latest Ref Rng & Units 02/14/2023    1:26 PM 11/16/2022   11:52 AM 08/16/2022    1:41 PM  CBC  WBC 4.0 - 10.5 K/uL 3.8  4.6  6.0   Hemoglobin 13.0 - 17.0 g/dL 9.5  16.1  09.6   Hematocrit 39.0 - 52.0 % 28.5  32.9  34.2   Platelets 150 - 400 K/uL 105  97  114       Latest Ref Rng & Units 02/14/2023    1:26 PM 11/16/2022   11:52 AM 09/10/2022    4:08 PM  CMP  Glucose 70 - 99 mg/dL 95  91  99   BUN 8 - 23 mg/dL 55  43  51   Creatinine 0.61 - 1.24 mg/dL  0.45  4.09  8.11   Sodium 135 - 145 mmol/L 132  137  140   Potassium 3.5 - 5.1 mmol/L 4.3  3.6  3.5   Chloride 98 - 111 mmol/L 103  106  102   CO2 22 - 32 mmol/L 21  23  20    Calcium 8.9 - 10.3 mg/dL 8.2  8.4  8.3   Total Protein 6.5 - 8.1 g/dL 6.1  5.4    Total Bilirubin 0.3 - 1.2 mg/dL 0.6  0.8    Alkaline Phos 38 - 126 U/L 62  59    AST 15 - 41 U/L 23  15    ALT 0 - 44 U/L 19  14       Iron/TIBC/Ferritin/ %Sat    Component Value Date/Time   IRON 70 02/14/2023 1326   TIBC 178 (L) 02/14/2023 1326   FERRITIN 272 02/14/2023 1326   IRONPCTSAT 39 02/14/2023 1326      RADIOGRAPHIC STUDIES: I have personally reviewed the radiological images as listed and agreed with the findings in the report. CT ABDOMEN PELVIS WO CONTRAST  Result Date: 02/04/2023 CLINICAL DATA:  Diarrhea, weight loss, diverticulosis. History of prostate cancer. * Tracking Code: BO * EXAM: CT ABDOMEN AND PELVIS WITHOUT CONTRAST TECHNIQUE: Multidetector CT imaging of the abdomen and pelvis was performed following the standard protocol without IV contrast. RADIATION DOSE REDUCTION: This exam was performed according to the departmental dose-optimization program which includes automated exposure control, adjustment of the mA and/or kV according to patient size and/or use of iterative reconstruction technique. COMPARISON:  06/26/2022 CT abdomen/pelvis FINDINGS: Lower chest: Chronic patchy subpleural reticulation and ground-glass opacity at both lung bases, not definitely changed. Small posterior bilateral pleural effusions, increased from prior CT. Coronary atherosclerosis. Hepatobiliary: Normal liver size. No liver mass. Normal gallbladder with no radiopaque cholelithiasis. No biliary ductal dilatation. Pancreas: Normal, with no mass or duct dilation. Spleen: Normal size. No mass. Adrenals/Urinary Tract: Stable adrenals without discrete adrenal nodules. Chronic severe left renal cortical atrophy with chronic severe left  hydroureteronephrosis with multiple chronic obstructing distal left pelvic ureteral stones, largest 5 mm, unchanged. Nonobstructing 19 x 16 mm upper left renal stone is unchanged. No right hydronephrosis. No right renal stones. Normal caliber right ureter with no right ureteral stones. No contour deforming renal masses. Chronic mild diffuse bladder wall thickening, unchanged. No bladder stones. Stomach/Bowel: Large hiatal hernia, unchanged. Visualized stomach is not significantly distended and is otherwise grossly normal. Normal caliber small bowel with no small bowel wall thickening. Normal diminutive appendix. Oral contrast transits to the cecum. Marked left colonic diverticulosis with no acute large bowel wall thickening or acute pericolonic fat stranding. Vascular/Lymphatic: Atherosclerotic nonaneurysmal abdominal aorta. No pathologically enlarged lymph nodes in the abdomen or pelvis. Reproductive: Normal size prostate. Other: No pneumoperitoneum, ascites or focal fluid collection. Haziness of the central mesenteric fat and minimal presacral space ill-defined fluid and fat stranding, similar. Partially visualized bilateral gynecomastia. Mild anasarca. Musculoskeletal: No aggressive appearing focal osseous lesions. Healing subacute inferior right pubic ramus nondisplaced fracture. Mild thoracolumbar spondylosis. IMPRESSION: 1. No acute abnormality. No evidence of bowel obstruction or acute bowel inflammation. Marked left colonic diverticulosis, with no evidence of acute diverticulitis. 2. Chronic severe left renal cortical atrophy with chronic severe left hydroureteronephrosis with multiple chronic obstructing distal left pelvic ureteral stones, largest 5 mm, unchanged. Nonobstructing upper left renal stone is unchanged. 3. Chronic mild diffuse bladder wall thickening, unchanged, nonspecific. 4. Evidence of mild third-spacing of fluid, mildly increased. Small posterior bilateral pleural effusions, increased. Mild  anasarca. Haziness of the central mesenteric fat and mild presacral space ill-defined fluid. 5. Healing subacute inferior right pubic ramus nondisplaced fracture. 6. Large hiatal hernia. 7. Chronic patchy subpleural reticulation and ground-glass  opacity at both lung bases, not definitely changed, cannot exclude interstitial lung disease. 8. Coronary atherosclerosis. 9.  Aortic Atherosclerosis (ICD10-I70.0). Electronically Signed   By: Delbert Phenix M.D.   On: 02/04/2023 10:10

## 2023-02-14 NOTE — Assessment & Plan Note (Signed)
Lab Results  Component Value Date   HGB 9.5 (L) 02/14/2023   TIBC 178 (L) 02/14/2023   IRONPCTSAT 39 02/14/2023   FERRITIN 272 02/14/2023    Ferritin is at goal >200 Recommend erythropoietin replacement therapy.  Rationale and potential side effects were reviewed and discussed with patient and her daughter-in-law Clydie Braun. They agree with the plan. Recommend Retacrit 10,000 unit every 4 weeks

## 2023-02-14 NOTE — Assessment & Plan Note (Addendum)
History of prostate cancer, risk progressively increased PSA. Patient denies any aggressive work-up or treatments for his prostate cancer. He is only interested in androgen deprivation therapy.  Clinically he is stable.  Denies any pain. Labs reviewed and discussed with patient and daughter in law Proceed with Eligard 45 mg today

## 2023-02-14 NOTE — Progress Notes (Signed)
Palliative Medicine [facility] at [facility] [phone] [phone]   Name: [name] Date: 02/14/2023 MRN: [id number]  DOB: 10/05/1926  Patient Care Team: Pcp, No as PCP - Precious Bard, MD as Consulting Physician (Cardiology) Lamont Dowdy, MD as Consulting Physician (Nephrology) Carmina Miller, MD as Referring Physician (Radiation Oncology) [name], MD as Consulting Physician (Oncology)    REASON FOR CONSULTATION: [name] is a 89 y.o. male with multiple medical problems including prostate cancer diagnosed in 2006 previously on Lupron. Patient is now on every 6 month Eligard but is not interested in any further imaging, work-up or aggressive treatments. He is referred to palliative care.   SOCIAL HISTORY:     reports that he quit smoking about 62 years ago. His smoking use included cigarettes and cigars. He has been exposed to tobacco smoke. His smokeless tobacco use includes chew. He reports that he does not currently use alcohol. He reports that he does not use drugs.  ADVANCE DIRECTIVES:  Not on file  CODE STATUS:   PAST MEDICAL HISTORY: Past Medical History:  Diagnosis Date   Anemia    Cardiac disease    CHF (congestive heart failure) (HCC)    Choledocholithiasis    Chronic kidney disease (CKD), stage IV (severe) (HCC)    left   Elevated LFTs    Esophageal stenosis    Hiatal hernia 04/03/2019   large   History of blindness    History of diarrhea    History of gallstones    Hypercholesteremia    Hypertension    Hypothyroidism    Malnutrition (HCC)    Osteoporosis    Prostate cancer (HCC)    Sepsis (HCC)    Shingles    Shingles    Skin cancer    right cheek    PAST SURGICAL HISTORY:  Past Surgical History:  Procedure Laterality Date   CARDIAC CATHETERIZATION  04/13/2014   CT PERC CHOLECYSTOSTOMY  07/20/2016   Placed at [facility] Interventional Radiology   ENDOSCOPIC RETROGRADE  CHOLANGIOPANCREATOGRAPHY (ERCP) WITH PROPOFOL N/A 04/06/2019   Procedure: ENDOSCOPIC RETROGRADE CHOLANGIOPANCREATOGRAPHY (ERCP) WITH PROPOFOL;  Surgeon: Midge Minium, MD;  Location: [facility] ENDOSCOPY;  Service: Endoscopy;  Laterality: N/A;    HEMATOLOGY/ONCOLOGY HISTORY:  Oncology History  Malignant neoplasm of prostate (HCC)  03/31/2019 Initial Diagnosis   Malignant neoplasm of prostate (HCC)   06/20/2021 Cancer Staging   Staging form: Prostate, AJCC 8th Edition - Clinical stage from 06/20/2021: Stage Unknown (rcTX, cNX) - Signed by [name], MD on 06/20/2021 Stage prefix: Recurrence     ALLERGIES:  is allergic to ativan [lorazepam], penicillin g, amlodipine, lisinopril, spironolactone, and tessalon [benzonatate].  MEDICATIONS:  Current Outpatient Medications  Medication Sig Dispense Refill   amiodarone (PACERONE) 200 MG tablet Take 1 tablet (200 mg total) by mouth 2 (two) times daily. (Patient taking differently: Take 200 mg by mouth daily.) 30 tablet 11   apixaban (ELIQUIS) 2.5 MG TABS tablet Take 1 tablet (2.5 mg total) by mouth 2 (two) times daily. 60 tablet 4   atorvastatin (LIPITOR) 40 MG tablet Take 40 mg by mouth at bedtime.      Cholecalciferol (VITAMIN D3) 5000 units TABS Take 5,000 Units by mouth daily.     [employer] DIGESTIVE PROBIOTIC 250 MG capsule Take 250 mg by mouth 2 (two) times daily.     cyanocobalamin (,VITAMIN B-12,) 1000 MCG/ML injection Inject 1,000 mcg into the muscle every 30 (thirty) days. Every 15th  of the month.     ferrous sulfate 325 (65 FE) MG tablet Take 325 mg by mouth 2 (two) times daily.      levothyroxine (SYNTHROID) 100 MCG tablet Take 100 mcg by mouth daily before breakfast.     mirabegron ER (MYRBETRIQ) 50 MG TB24 tablet Take 1 tablet (50 mg total) by mouth daily. 30 tablet 11   nitroGLYCERIN (NITROSTAT) 0.4 MG SL tablet Place 1 tablet (0.4 mg total) under the tongue every 5 (five) minutes as needed for chest pain. 25 tablet 2   Omega-3 Fatty Acids (FISH  OIL) 1200 MG CAPS Take 1,200 mg by mouth 2 (two) times daily.     pantoprazole (PROTONIX) 40 MG tablet Take 40 mg by mouth daily.     potassium chloride (KLOR-CON) 10 MEQ tablet Take 10 mEq by mouth daily.     sodium bicarbonate 650 MG tablet Take 2 tablets (1,300 mg total) by mouth 3 (three) times daily. (Patient taking differently: Take 1,300 mg by mouth 2 (two) times daily.) 90 tablet 1   tamsulosin (FLOMAX) 0.4 MG CAPS capsule Take 0.4 mg by mouth at bedtime.      torsemide (DEMADEX) 20 MG tablet Take 20 mg by mouth daily.     No current facility-administered medications for this visit.    VITAL SIGNS: There were no vitals taken for this visit. There were no vitals filed for this visit.  Estimated body mass index is 20.67 kg/m as calculated from the following:   Height as of 12/26/22: 5\' 7"  (1.702 m).   Weight as of an earlier encounter on 02/14/23: 132 lb (59.9 kg).  LABS: CBC:    Component Value Date/Time   WBC 3.8 (L) 02/14/2023 1326   HGB 9.5 (L) 02/14/2023 1326   HGB 15.1 03/05/2012 1142   HCT 28.5 (L) 02/14/2023 1326   HCT 43.9 03/05/2012 1142   PLT 105 (L) 02/14/2023 1326   PLT 211 03/05/2012 1142   MCV 92.2 02/14/2023 1326   MCV 84 03/05/2012 1142   NEUTROABS 2.1 02/14/2023 1326   NEUTROABS 5.9 03/05/2012 1142   LYMPHSABS 1.4 02/14/2023 1326   LYMPHSABS 0.9 (L) 03/05/2012 1142   MONOABS 0.2 02/14/2023 1326   MONOABS 0.6 03/05/2012 1142   EOSABS 0.0 02/14/2023 1326   EOSABS 0.1 03/05/2012 1142   BASOSABS 0.0 02/14/2023 1326   BASOSABS 0.0 03/05/2012 1142   Comprehensive Metabolic Panel:    Component Value Date/Time   NA 132 (L) 02/14/2023 1326   NA 140 09/10/2022 1608   NA 142 03/07/2012 0520   K 4.3 02/14/2023 1326   K 3.4 (L) 03/07/2012 1551   CL 103 02/14/2023 1326   CL 105 03/07/2012 0520   CO2 21 (L) 02/14/2023 1326   CO2 27 03/07/2012 0520   BUN 55 (H) 02/14/2023 1326   BUN 51 (H) 09/10/2022 1608   BUN 14 03/07/2012 0520   CREATININE 3.40 (H)  02/14/2023 1326   CREATININE 1.81 (H) 03/07/2012 0520   GLUCOSE 95 02/14/2023 1326   GLUCOSE 128 (H) 03/07/2012 0520   CALCIUM 8.2 (L) 02/14/2023 1326   CALCIUM 8.2 (L) 03/07/2012 0520   AST 23 02/14/2023 1326   AST 33 03/05/2012 1142   ALT 19 02/14/2023 1326   ALT 30 03/05/2012 1142   ALKPHOS 62 02/14/2023 1326   ALKPHOS 75 03/05/2012 1142   BILITOT 0.6 02/14/2023 1326   BILITOT 1.0 03/05/2012 1142   PROT 6.1 (L) 02/14/2023 1326   PROT 7.0 03/05/2012 1142  ALBUMIN 3.3 (L) 02/14/2023 1326   ALBUMIN 3.5 03/05/2012 1142    RADIOGRAPHIC STUDIES: CT ABDOMEN PELVIS WO CONTRAST  Result Date: 02/04/2023 CLINICAL DATA:  Diarrhea, weight loss, diverticulosis. History of prostate cancer. * Tracking Code: BO * EXAM: CT ABDOMEN AND PELVIS WITHOUT CONTRAST TECHNIQUE: Multidetector CT imaging of the abdomen and pelvis was performed following the standard protocol without IV contrast. RADIATION DOSE REDUCTION: This exam was performed according to the departmental dose-optimization program which includes automated exposure control, adjustment of the mA and/or kV according to patient size and/or use of iterative reconstruction technique. COMPARISON:  06/26/2022 CT abdomen/pelvis FINDINGS: Lower chest: Chronic patchy subpleural reticulation and ground-glass opacity at both lung bases, not definitely changed. Small posterior bilateral pleural effusions, increased from prior CT. Coronary atherosclerosis. Hepatobiliary: Normal liver size. No liver mass. Normal gallbladder with no radiopaque cholelithiasis. No biliary ductal dilatation. Pancreas: Normal, with no mass or duct dilation. Spleen: Normal size. No mass. Adrenals/Urinary Tract: Stable adrenals without discrete adrenal nodules. Chronic severe left renal cortical atrophy with chronic severe left hydroureteronephrosis with multiple chronic obstructing distal left pelvic ureteral stones, largest 5 mm, unchanged. Nonobstructing 19 x 16 mm upper left renal  stone is unchanged. No right hydronephrosis. No right renal stones. Normal caliber right ureter with no right ureteral stones. No contour deforming renal masses. Chronic mild diffuse bladder wall thickening, unchanged. No bladder stones. Stomach/Bowel: Large hiatal hernia, unchanged. Visualized stomach is not significantly distended and is otherwise grossly normal. Normal caliber small bowel with no small bowel wall thickening. Normal diminutive appendix. Oral contrast transits to the cecum. Marked left colonic diverticulosis with no acute large bowel wall thickening or acute pericolonic fat stranding. Vascular/Lymphatic: Atherosclerotic nonaneurysmal abdominal aorta. No pathologically enlarged lymph nodes in the abdomen or pelvis. Reproductive: Normal size prostate. Other: No pneumoperitoneum, ascites or focal fluid collection. Haziness of the central mesenteric fat and minimal presacral space ill-defined fluid and fat stranding, similar. Partially visualized bilateral gynecomastia. Mild anasarca. Musculoskeletal: No aggressive appearing focal osseous lesions. Healing subacute inferior right pubic ramus nondisplaced fracture. Mild thoracolumbar spondylosis. IMPRESSION: 1. No acute abnormality. No evidence of bowel obstruction or acute bowel inflammation. Marked left colonic diverticulosis, with no evidence of acute diverticulitis. 2. Chronic severe left renal cortical atrophy with chronic severe left hydroureteronephrosis with multiple chronic obstructing distal left pelvic ureteral stones, largest 5 mm, unchanged. Nonobstructing upper left renal stone is unchanged. 3. Chronic mild diffuse bladder wall thickening, unchanged, nonspecific. 4. Evidence of mild third-spacing of fluid, mildly increased. Small posterior bilateral pleural effusions, increased. Mild anasarca. Haziness of the central mesenteric fat and mild presacral space ill-defined fluid. 5. Healing subacute inferior right pubic ramus nondisplaced  fracture. 6. Large hiatal hernia. 7. Chronic patchy subpleural reticulation and ground-glass opacity at both lung bases, not definitely changed, cannot exclude interstitial lung disease. 8. Coronary atherosclerosis. 9.  Aortic Atherosclerosis (ICD10-I70.0). Electronically Signed   By: Delbert Phenix M.D.   On: 02/04/2023 10:10    PERFORMANCE STATUS (ECOG) : 3 - Symptomatic, >50% confined to bed  Review of Systems Unless otherwise noted, a complete review of systems is negative.  Physical Exam General: NAD Cardiovascular: regular rate and rhythm Pulmonary: clear ant fields Abdomen: soft, nontender, + bowel sounds GU: no suprapubic tenderness Extremities: no edema, no joint deformities Skin: no rashes Neurological: Weakness but otherwise nonfocal  IMPRESSION: Met with patient and daughter-in-law following their visit with Dr. Cathie Hoops.  Patient reports that he is doing about the same.  Denies significant changes or  concerns today.  No acute symptomatic complaints.  Patient lives at home with family and requires assistance as he is legally blind.    Patient has previously verbalized a desire to on stay home and focus on quality of life.  We had previously discussed future hospice involvement.  However for now, patient is on ADT.  PLAN: -Continue current scope of treatment -Follow-up telephone visit 2 to 3 months   Patient expressed understanding and was in agreement with this plan. He also understands that He can call the clinic at any time with any questions, concerns, or complaints.     Time Total: 15 minutes  Visit consisted of counseling and education dealing with the complex and emotionally intense issues of symptom management and palliative care in the setting of serious and potentially life-threatening illness.Greater than 50%  of this time was spent counseling and coordinating care related to the above assessment and plan.  Signed by: Laurette Schimke, PhD, NP-C

## 2023-02-14 NOTE — Assessment & Plan Note (Addendum)
Encourage oral hydration and avoid nephrotoxins. Follow up with nephrology

## 2023-02-15 ENCOUNTER — Telehealth: Payer: Self-pay

## 2023-02-15 NOTE — Telephone Encounter (Signed)
-----   Message from [name] sent at 02/14/2023  8:22 PM EDT ----- Please let patient and daughter in law Clydie Braun know that his iron level is adequate, I recommend retacrit injection, please arrange.  4 weeks after retacrit: Lab +/- retacrit.  2 months lab MD +/- retacrit. Thanks.  zy

## 2023-02-15 NOTE — Telephone Encounter (Signed)
Spoke to Clydie Braun and informed her of MD recommendation. She states she would like a call prior to setting appts up, since she has a busy schedule, pt will need  Retacrit inj (1st inj)- next week  4 weeks after 1st inj: Lab/ +/- retacrit  2 months : lab/Md/ +/- retacrit

## 2023-02-20 ENCOUNTER — Inpatient Hospital Stay: Payer: Medicare Other

## 2023-02-20 VITALS — BP 89/52

## 2023-02-20 DIAGNOSIS — C61 Malignant neoplasm of prostate: Secondary | ICD-10-CM

## 2023-02-20 DIAGNOSIS — Z5111 Encounter for antineoplastic chemotherapy: Secondary | ICD-10-CM | POA: Diagnosis not present

## 2023-02-20 MED ORDER — EPOETIN ALFA-EPBX 10000 UNIT/ML IJ SOLN
10000.0000 [IU] | Freq: Once | INTRAMUSCULAR | Status: AC
  Administered 2023-02-20: 10000 [IU] via SUBCUTANEOUS
  Filled 2023-02-20: qty 1

## 2023-02-21 ENCOUNTER — Ambulatory Visit: Payer: Medicare Other | Admitting: Physician Assistant

## 2023-03-13 ENCOUNTER — Inpatient Hospital Stay: Payer: Medicare Other

## 2023-03-13 ENCOUNTER — Inpatient Hospital Stay: Payer: Medicare Other | Attending: Oncology

## 2023-03-13 VITALS — BP 94/50

## 2023-03-13 DIAGNOSIS — N184 Chronic kidney disease, stage 4 (severe): Secondary | ICD-10-CM | POA: Diagnosis present

## 2023-03-13 DIAGNOSIS — D631 Anemia in chronic kidney disease: Secondary | ICD-10-CM | POA: Diagnosis present

## 2023-03-13 DIAGNOSIS — C61 Malignant neoplasm of prostate: Secondary | ICD-10-CM

## 2023-03-13 LAB — HEMOGLOBIN AND HEMATOCRIT, BLOOD
HCT: 30.2 % — ABNORMAL LOW (ref 39.0–52.0)
Hemoglobin: 9.8 g/dL — ABNORMAL LOW (ref 13.0–17.0)

## 2023-03-13 MED ORDER — EPOETIN ALFA-EPBX 10000 UNIT/ML IJ SOLN
10000.0000 [IU] | Freq: Once | INTRAMUSCULAR | Status: AC
  Administered 2023-03-13: 10000 [IU] via SUBCUTANEOUS
  Filled 2023-03-13: qty 1

## 2023-03-15 ENCOUNTER — Encounter: Payer: Self-pay | Admitting: Physician Assistant

## 2023-03-18 ENCOUNTER — Encounter: Payer: Self-pay | Admitting: Cardiovascular Disease

## 2023-03-18 ENCOUNTER — Ambulatory Visit (INDEPENDENT_AMBULATORY_CARE_PROVIDER_SITE_OTHER): Payer: Medicare Other | Admitting: Cardiovascular Disease

## 2023-03-18 VITALS — BP 99/63 | HR 64 | Ht 62.0 in | Wt 133.0 lb

## 2023-03-18 DIAGNOSIS — I5032 Chronic diastolic (congestive) heart failure: Secondary | ICD-10-CM

## 2023-03-18 DIAGNOSIS — I639 Cerebral infarction, unspecified: Secondary | ICD-10-CM

## 2023-03-18 DIAGNOSIS — I2510 Atherosclerotic heart disease of native coronary artery without angina pectoris: Secondary | ICD-10-CM | POA: Diagnosis not present

## 2023-03-18 DIAGNOSIS — I493 Ventricular premature depolarization: Secondary | ICD-10-CM

## 2023-03-18 DIAGNOSIS — I129 Hypertensive chronic kidney disease with stage 1 through stage 4 chronic kidney disease, or unspecified chronic kidney disease: Secondary | ICD-10-CM

## 2023-03-18 NOTE — Progress Notes (Signed)
Cardiology Office Note   Date:  03/18/2023   ID:  [name], DOB 1926/10/16, MRN [id number]  PCP:  Pcp, No  Cardiologist:  Adrian Blackwater, MD      History of Present Illness: [name] is a 89 y.o. male who presents for  Chief Complaint  Patient presents with   Follow-up    Doing well      Past Medical History:  Diagnosis Date   Anemia    Cardiac disease    CHF (congestive heart failure) (HCC)    Choledocholithiasis    Chronic kidney disease (CKD), stage IV (severe) (HCC)    left   Elevated LFTs    Esophageal stenosis    Hiatal hernia 04/03/2019   large   History of blindness    History of diarrhea    History of gallstones    Hypercholesteremia    Hypertension    Hypothyroidism    Malnutrition (HCC)    Osteoporosis    Prostate cancer (HCC)    Sepsis (HCC)    Shingles    Shingles    Skin cancer    right cheek     Past Surgical History:  Procedure Laterality Date   CARDIAC CATHETERIZATION  04/13/2014   CT PERC CHOLECYSTOSTOMY  07/20/2016   Placed at [facility] Interventional Radiology   ENDOSCOPIC RETROGRADE CHOLANGIOPANCREATOGRAPHY (ERCP) WITH PROPOFOL N/A 04/06/2019   Procedure: ENDOSCOPIC RETROGRADE CHOLANGIOPANCREATOGRAPHY (ERCP) WITH PROPOFOL;  Surgeon: Midge Minium, MD;  Location: [facility] ENDOSCOPY;  Service: Endoscopy;  Laterality: N/A;     Current Outpatient Medications  Medication Sig Dispense Refill   amiodarone (PACERONE) 200 MG tablet Take 1 tablet (200 mg total) by mouth 2 (two) times daily. (Patient taking differently: Take 200 mg by mouth daily.) 30 tablet 11   apixaban (ELIQUIS) 2.5 MG TABS tablet Take 1 tablet (2.5 mg total) by mouth 2 (two) times daily. 60 tablet 4   atorvastatin (LIPITOR) 40 MG tablet Take 40 mg by mouth at bedtime.      Cholecalciferol (VITAMIN D3) 5000 units TABS Take 5,000 Units by mouth daily.     [employer]S DIGESTIVE PROBIOTIC 250 MG capsule Take 250 mg by mouth 2 (two) times daily.     cyanocobalamin (,VITAMIN B-12,)  1000 MCG/ML injection Inject 1,000 mcg into the muscle every 30 (thirty) days. Every 15th of the month.     ferrous sulfate 325 (65 FE) MG tablet Take 325 mg by mouth 2 (two) times daily.      levothyroxine (SYNTHROID) 100 MCG tablet Take 100 mcg by mouth daily before breakfast.     mirabegron ER (MYRBETRIQ) 50 MG TB24 tablet Take 1 tablet (50 mg total) by mouth daily. 30 tablet 11   nitroGLYCERIN (NITROSTAT) 0.4 MG SL tablet Place 1 tablet (0.4 mg total) under the tongue every 5 (five) minutes as needed for chest pain. 25 tablet 2   Omega-3 Fatty Acids (FISH OIL) 1200 MG CAPS Take 1,200 mg by mouth 2 (two) times daily.     pantoprazole (PROTONIX) 40 MG tablet Take 40 mg by mouth daily.     potassium chloride (KLOR-CON) 10 MEQ tablet Take 10 mEq by mouth daily.     sodium bicarbonate 650 MG tablet Take 2 tablets (1,300 mg total) by mouth 3 (three) times daily. (Patient taking differently: Take 1,300 mg by mouth 2 (two) times daily.) 90 tablet 1   tamsulosin (FLOMAX) 0.4 MG CAPS capsule Take 0.4 mg by mouth at bedtime.  torsemide (DEMADEX) 20 MG tablet Take 20 mg by mouth daily.     No current facility-administered medications for this visit.    Allergies:   Ativan [lorazepam], Penicillin g, Amlodipine, Lisinopril, Spironolactone, and Tessalon [benzonatate]    Social History:   reports that he quit smoking about 62 years ago. His smoking use included cigarettes and cigars. He has been exposed to tobacco smoke. His smokeless tobacco use includes chew. He reports that he does not currently use alcohol. He reports that he does not use drugs.   Family History:  family history includes CAD in his mother; Cancer in his mother; Heart attack in his father.    ROS:     Review of Systems  Constitutional: Negative.   HENT: Negative.    Eyes: Negative.   Respiratory: Negative.    Gastrointestinal: Negative.   Genitourinary: Negative.   Musculoskeletal: Negative.   Skin: Negative.    Neurological: Negative.   Endo/Heme/Allergies: Negative.   Psychiatric/Behavioral: Negative.    All other systems reviewed and are negative.     All other systems are reviewed and negative.    PHYSICAL EXAM: VS:  BP 99/63   Pulse 64   Ht 5\' 2"  (1.575 m)   Wt 133 lb (60.3 kg)   SpO2 93%   BMI 24.33 kg/m  , BMI Body mass index is 24.33 kg/m. Last weight:  Wt Readings from Last 3 Encounters:  03/18/23 133 lb (60.3 kg)  02/14/23 132 lb (59.9 kg)  12/26/22 133 lb (60.3 kg)     Physical Exam Vitals reviewed.  Constitutional:      Appearance: Normal appearance. He is normal weight.  HENT:     Head: Normocephalic.     Nose: Nose normal.     Mouth/Throat:     Mouth: Mucous membranes are moist.  Eyes:     Pupils: Pupils are equal, round, and reactive to light.  Cardiovascular:     Rate and Rhythm: Normal rate and regular rhythm.     Pulses: Normal pulses.     Heart sounds: Normal heart sounds.  Pulmonary:     Effort: Pulmonary effort is normal.  Abdominal:     General: Abdomen is flat. Bowel sounds are normal.  Musculoskeletal:        General: Normal range of motion.     Cervical back: Normal range of motion.  Skin:    General: Skin is warm.  Neurological:     General: No focal deficit present.     Mental Status: He is alert.  Psychiatric:        Mood and Affect: Mood normal.       EKG:   Recent Labs: 06/13/2022: TSH 0.888 06/28/2022: Magnesium 1.8 02/14/2023: ALT 19; BUN 55; Creatinine, Ser 3.40; Platelets 105; Potassium 4.3; Sodium 132 03/13/2023: Hemoglobin 9.8    Lipid Panel    Component Value Date/Time   CHOL 143 06/13/2022 0400   TRIG 71 06/13/2022 0400   HDL 58 06/13/2022 0400   CHOLHDL 2.5 06/13/2022 0400   VLDL 14 06/13/2022 0400   LDLCALC 71 06/13/2022 0400      Other studies Reviewed: Additional studies/ records that were reviewed today include:  Review of the above records demonstrates:       No data to display             ASSESSMENT AND PLAN:    ICD-10-CM   1. 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 Cerebrovascular accident (CVA), unspecified mechanism (  HCC)  I63.9     3. Chronic diastolic (congestive) heart failure (HCC)  I50.32    continue Toresemide 20 mg daily. LVE77%, mild MR/TR    4. Coronary artery disease involving native coronary artery of native heart without angina pectoris  I25.10     5. Benign hypertensive kidney disease with chronic kidney disease stage I through stage IV, or unspecified  I12.9        Problem List Items Addressed This Visit       Cardiovascular and Mediastinum   Chronic diastolic (congestive) heart failure (HCC)   CAD (coronary artery disease)   Stroke (HCC)   Frequent PVCs - Primary     Genitourinary   Benign hypertensive kidney disease with chronic kidney disease       Disposition:   Return in about 3 months (around 06/17/2023).    Total time spent: 30 minutes  Signed,  Adrian Blackwater, MD  03/18/2023 1:47 PM    [facility]

## 2023-03-21 ENCOUNTER — Ambulatory Visit: Payer: Medicare Other | Admitting: Cardiology

## 2023-03-31 NOTE — Progress Notes (Signed)
Assessment/Plan:   [name] is a very pleasant 89 y.o. year old RH male with a history of hypertension, hyperlipidemia, CKD4, ACD, DM2, hypothyroidism, history of prostate cancer on Eligard, CAD, history of CVA occipital lobe and cerebellar infarct per imaging, GERD, RLS seen today for evaluation of memory loss. MoCA today is /30.***.  Given his age, multiple medical issues mentioned above and overall performance, he is not a candidate for antidementia therapy, as the risks, outweigh the benefits of medicines. He is on Palliative Care, recommend to continue follow up with them.    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   How long did patient have memory difficulties? For the last ***.  Reports some difficulty remembering new information, conversations and names.  Long-term memory is good. repeats oneself?  Endorsed Disoriented when walking into a room?  Patient denies except occasionally not remembering what patient came to the room for ***  Leaving objects in unusual places? Denies.   Wandering behavior?  denies .  Any personality changes?  Denies.   Any history of depression?:  Denies   Hallucinations or paranoia?  Denies   Seizures?  Denies    Any sleep changes?   Sleeps well***does not sleep well***denies vivid dreams, REM behavior or sleepwalking   Sleep apnea?  Denies   Any hygiene concerns?  Denies   Independent of bathing and dressing?  Endorsed  Does the patient needs help with medications? Patient is in charge *** Who is in charge of the finances? Patient is in charge   *** Any changes in appetite?  Denies ***    Patient have trouble swallowing? Denies.   Does the patient cook? ***Not much. Any kitchen accidents such as leaving the stove on? Denies.   Any history of headaches?   Denies.   Chronic pain ? Denies.   Ambulates with difficulty?  Denies. *** Recent falls or head injuries? Denies.   Vision changes? He is legally blind. Unilateral weakness, numbness or tingling? Denies.   Any tremors?   Denies.   Any anosmia?  Denies.   Any incontinence of urine? Denies.   Any bowel dysfunction?  She has a history of diarrhea*** Patient lives with  *** History of heavy alcohol intake?  Remotely he was a beer drinker, he no longer does. History of heavy tobacco use?  He chews tobacco several times a day.  Until then he was smoking for about 80+ years Family history of dementia? Denies.  Does patient drive? Yes ***  Pertinent recent labs September 2024: H&H 9.8-30.2 respectively.   MRI brain 06/2022 (at the time of his CVA requiring hospitalization ) personally reviewed   Single punctate acute ischemic nonhemorrhagic infarct involving the posterior right frontal lobe/precentral gyrus. 2. No other acute intracranial abnormality. 3. Age-related cerebral atrophy with moderate chronic microvascular ischemic disease, with multiple chronic ischemic infarcts as above.   Past Medical History:  Diagnosis Date   Anemia    Cardiac disease    CHF (congestive heart failure) (HCC)    Choledocholithiasis    Chronic kidney disease (CKD), stage IV (severe) (HCC)    left   Elevated LFTs    Esophageal  stenosis    Hiatal hernia 04/03/2019   large   History of blindness    History of diarrhea    History of gallstones    Hypercholesteremia    Hypertension    Hypothyroidism    Malnutrition (HCC)    Osteoporosis    Prostate cancer (HCC)    Sepsis (HCC)    Shingles    Shingles    Skin cancer    right cheek     Past Surgical History:  Procedure Laterality Date   CARDIAC CATHETERIZATION  04/13/2014   CT  PERC CHOLECYSTOSTOMY  07/20/2016   Placed at [facility] Interventional Radiology   ENDOSCOPIC RETROGRADE CHOLANGIOPANCREATOGRAPHY (ERCP) WITH PROPOFOL N/A 04/06/2019   Procedure: ENDOSCOPIC RETROGRADE CHOLANGIOPANCREATOGRAPHY (ERCP) WITH PROPOFOL;  Surgeon: Midge Minium, MD;  Location: [facility] ENDOSCOPY;  Service: Endoscopy;  Laterality: N/A;     Allergies  Allergen Reactions   Ativan [Lorazepam] Other (See Comments)    Hallucinations, combative   Penicillin G Other (See Comments)    Has patient had a PCN reaction causing immediate rash, facial/tongue/throat swelling, SOB or lightheadedness with hypotension: Yes Has patient had a PCN reaction causing severe rash involving mucus membranes or skin necrosis: No Has patient had a PCN reaction that required hospitalization No Has patient had a PCN reaction occurring within the last 10 years: No If all of the above answers are "NO", then may proceed with Cephalosporin use.    Amlodipine Swelling   Lisinopril Other (See Comments)   Spironolactone Other (See Comments)   Tessalon [Benzonatate]     Current Outpatient Medications  Medication Instructions   amiodarone (PACERONE) 200 mg, Oral, 2 times daily   apixaban (ELIQUIS) 2.5 mg, Oral, 2 times daily   atorvastatin (LIPITOR) 40 mg, Oral, Daily at bedtime   [employer] Digestive Probiotic 250 mg, Oral, 2 times daily   cyanocobalamin (VITAMIN B12) 1,000 mcg, Intramuscular, Every 30 days, Every 15th of the month.   ferrous sulfate 325 mg, Oral, 2 times daily   Fish Oil 1,200 mg, Oral, 2 times daily   levothyroxine (SYNTHROID) 100 mcg, Oral, Daily before breakfast   mirabegron ER (MYRBETRIQ) 50 mg, Oral, Daily   nitroGLYCERIN (NITROSTAT) 0.4 mg, Sublingual, Every 5 min PRN   pantoprazole (PROTONIX) 40 mg, Oral, Daily   potassium chloride (KLOR-CON) 10 MEQ tablet 10 mEq, Oral, Daily   sodium bicarbonate 1,300 mg, Oral, 3 times daily   tamsulosin (FLOMAX) 0.4 mg, Oral, Daily at bedtime   torsemide (DEMADEX)  20 mg, Oral, Daily   Vitamin D3 5,000 Units, Oral, Daily     VITALS:  There were no vitals filed for this visit.    11/20/2017    1:42 PM 11/21/2016    2:19 PM 05/23/2016    2:43 PM 11/24/2015    2:32 PM  Depression screen PHQ 2/9  Decreased Interest 0 0 0 0  Down, Depressed, Hopeless 0 0 0 0  PHQ - 2 Score 0 0 0 0    PHYSICAL EXAM   HEENT:  Normocephalic, atraumatic. The superficial temporal arteries are without ropiness or tenderness. Cardiovascular: Regular rate and rhythm. Lungs: Clear to auscultation bilaterally. Neck: There are no carotid bruits noted bilaterally.  NEUROLOGICAL:     No data to display              No data to display           Orientation:  Alert and oriented to person, place and time. No aphasia or dysarthria. Fund of knowledge  is appropriate. Recent memory impaired and remote memory intact.  Attention and concentration are normal.  Able to name objects and repeat phrases. Delayed recall  /5 Cranial nerves: There is good facial symmetry. Extraocular muscles are intact and visual fields are full to confrontational testing. Speech is fluent and clear. No tongue deviation. Hearing is intact to conversational tone. Tone: Tone is good throughout. Sensation: Sensation is intact to light touch. Vibration is intact at the bilateral big toe.  Coordination: The patient has no difficulty with RAM's or FNF bilaterally. Normal finger to nose  Motor: Strength is 5/5 in the bilateral upper and lower extremities. There is no pronator drift. There are no fasciculations noted. DTR's: Deep tendon reflexes are 2/4 bilaterally. Gait and Station: The patient is able to ambulate without difficulty.patient is on a wheelchair, gait has not been tested.  Thank you for allowing [place] the opportunity to participate in the care of this nice patient. Please do not hesitate to contact us for any questions or concerns.   Total time spent on today's visit was *** minutes dedicated  to this patient today, preparing to see patient, examining the patient, ordering tests and/or medications and counseling the patient, documenting clinical information in the EHR or other health record, independently interpreting results and communicating results to the patient/family, discussing treatment and goals, answering patient's questions and coordinating care.  Cc:  Pcp, No  Marlowe Kays 03/31/2023 4:48 PM

## 2023-04-01 ENCOUNTER — Ambulatory Visit: Payer: Medicare Other

## 2023-04-01 ENCOUNTER — Ambulatory Visit (INDEPENDENT_AMBULATORY_CARE_PROVIDER_SITE_OTHER): Payer: Medicare Other | Admitting: Physician Assistant

## 2023-04-01 ENCOUNTER — Encounter: Payer: Self-pay | Admitting: Physician Assistant

## 2023-04-01 VITALS — BP 102/62 | HR 74 | Resp 20 | Ht 65.0 in | Wt 130.0 lb

## 2023-04-01 DIAGNOSIS — F0280 Dementia in other diseases classified elsewhere without behavioral disturbance: Secondary | ICD-10-CM | POA: Diagnosis not present

## 2023-04-01 DIAGNOSIS — G309 Alzheimer's disease, unspecified: Secondary | ICD-10-CM

## 2023-04-01 NOTE — Patient Instructions (Addendum)
It was a pleasure to see you today at our office.   Recommendations:  Follow up  as needed  Recommend visiting the website : " Dementia Success Path" to better understand some behaviors related to memory loss   For guidance regarding [facility] and if placement were needed at the facility, contact Sidney Ace, Social Worker tel: [phone]   For assessment of decision of mental capacity and competency:  Call Dr. Erick Blinks, geriatric psychiatrist at [phone]  Counseling regarding caregiver distress, including caregiver depression, anxiety and issues regarding community resources, adult day care programs, adult living facilities, or memory care questions:  please contact your  Primary Doctor's Social Worker  Whom to call: Memory  decline, memory medications: Call our office [phone]   For psychiatric meds, mood meds: Please have your primary care physician manage these medications.  If you have any severe symptoms of a stroke, or other severe issues such as confusion,severe chills or fever, etc call 911 or go to the ER as you may need to be evaluated further   [email or web address]   RECOMMENDATIONS FOR ALL PATIENTS WITH MEMORY PROBLEMS: 1. Continue to exercise (Recommend 30 minutes of walking everyday, or 3 hours every week) 2. Increase social interactions - continue going to [place] and enjoy social gatherings with friends and family 3. Eat healthy, avoid fried foods and eat more fruits and vegetables 4. Maintain adequate blood pressure, blood sugar, and blood cholesterol level. Reducing the risk of stroke and cardiovascular disease also helps promoting better memory. 5. Avoid stressful situations. Live a simple life and avoid aggravations. Organize your time and prepare for the next day in anticipation. 6. Sleep well, avoid any interruptions of sleep and avoid any distractions in the bedroom that may  interfere with adequate sleep quality 7. Avoid sugar, avoid sweets as there is a strong link between excessive sugar intake, diabetes, and cognitive impairment We discussed the Mediterranean diet, which has been shown to help patients reduce the risk of progressive memory disorders and reduces cardiovascular risk. This includes eating fish, eat fruits and green leafy vegetables, nuts like almonds and hazelnuts, walnuts, and also use olive oil. Avoid fast foods and fried foods as much as possible. Avoid sweets and sugar as sugar use has been linked to worsening of memory function.  There is always a concern of gradual progression of memory problems. If this is the case, then we may need to adjust level of care according to patient needs. Support, both to the patient and caregiver, should then be put into place.    The Alzheimer's Association is here all day, every day for people facing Alzheimer's disease through our free 24/7 Helpline: (575)867-0302. The Helpline provides reliable information and support to all those who need assistance, such as individuals living with memory loss, Alzheimer's or other dementia, caregivers, health care professionals and the public.  Our highly trained and knowledgeable staff can help you with: Understanding memory loss, dementia and Alzheimer's  Medications and other treatment options  General information about aging and brain health  Skills to provide quality care and to find the best care from professionals  Legal, financial and living-arrangement decisions Our Helpline also features: Confidential care consultation provided by master's level clinicians who can help with decision-making support, crisis assistance and education on issues families face every day  Help in a caller's preferred language using our translation service that features more than 200 languages and dialects  Referrals to local community programs, services and ongoing  support     FALL  PRECAUTIONS: Be cautious when walking. Scan the area for obstacles that may increase the risk of trips and falls. When getting up in the mornings, sit up at the edge of the bed for a few minutes before getting out of bed. Consider elevating the bed at the head end to avoid drop of blood pressure when getting up. Walk always in a well-lit room (use night lights in the walls). Avoid area rugs or power cords from appliances in the middle of the walkways. Use a walker or a cane if necessary and consider physical therapy for balance exercise. Get your eyesight checked regularly.  FINANCIAL OVERSIGHT: Supervision, especially oversight when making financial decisions or transactions is also recommended.  HOME SAFETY: Consider the safety of the kitchen when operating appliances like stoves, microwave oven, and blender. Consider having supervision and share cooking responsibilities until no longer able to participate in those. Accidents with firearms and other hazards in the house should be identified and addressed as well.   ABILITY TO BE LEFT ALONE: If patient is unable to contact 911 operator, consider using LifeLine, or when the need is there, arrange for someone to stay with patients. Smoking is a fire hazard, consider supervision or cessation. Risk of wandering should be assessed by caregiver and if detected at any point, supervision and safe proof recommendations should be instituted.  MEDICATION SUPERVISION: Inability to self-administer medication needs to be constantly addressed. Implement a mechanism to ensure safe administration of the medications.       Mediterranean Diet A Mediterranean diet refers to food and lifestyle choices that are based on the traditions of countries located on the [employer]. This way of eating has been shown to help prevent certain conditions and improve outcomes for people who have chronic diseases, like kidney disease and heart disease. What are tips for  following this plan? Lifestyle  Cook and eat meals together with your family, when possible. Drink enough fluid to keep your urine clear or pale yellow. Be physically active every day. This includes: Aerobic exercise like running or swimming. Leisure activities like gardening, walking, or housework. Get 7-8 hours of sleep each night. If recommended by your health care provider, drink red wine in moderation. This means 1 glass a day for nonpregnant women and 2 glasses a day for men. A glass of wine equals 5 oz (150 mL). Reading food labels  Check the serving size of packaged foods. For foods such as rice and pasta, the serving size refers to the amount of cooked product, not dry. Check the total fat in packaged foods. Avoid foods that have saturated fat or trans fats. Check the ingredients list for added sugars, such as corn syrup. Shopping  At the grocery store, buy most of your food from the areas near the walls of the store. This includes: Fresh fruits and vegetables (produce). Grains, beans, nuts, and seeds. Some of these may be available in unpackaged forms or large amounts (in bulk). Fresh seafood. Poultry and eggs. Low-fat dairy products. Buy whole ingredients instead of prepackaged foods. Buy fresh fruits and vegetables in-season from local farmers markets. Buy frozen fruits and vegetables in resealable bags. If you do not have access to quality fresh seafood, buy precooked frozen shrimp or canned fish, such as tuna, salmon, or sardines. Buy small amounts of raw or cooked vegetables, salads, or olives from the deli or salad bar at your store. Stock your pantry so you always have certain foods  on hand, such as olive oil, canned tuna, canned tomatoes, rice, pasta, and beans. Cooking  Cook foods with extra-virgin olive oil instead of using butter or other vegetable oils. Have meat as a side dish, and have vegetables or grains as your main dish. This means having meat in small portions  or adding small amounts of meat to foods like pasta or stew. Use beans or vegetables instead of meat in common dishes like chili or lasagna. Experiment with different cooking methods. Try roasting or broiling vegetables instead of steaming or sauteing them. Add frozen vegetables to soups, stews, pasta, or rice. Add nuts or seeds for added healthy fat at each meal. You can add these to yogurt, salads, or vegetable dishes. Marinate fish or vegetables using olive oil, lemon juice, garlic, and fresh herbs. Meal planning  Plan to eat 1 vegetarian meal one day each week. Try to work up to 2 vegetarian meals, if possible. Eat seafood 2 or more times a week. Have healthy snacks readily available, such as: Vegetable sticks with hummus. Greek yogurt. Fruit and nut trail mix. Eat balanced meals throughout the week. This includes: Fruit: 2-3 servings a day Vegetables: 4-5 servings a day Low-fat dairy: 2 servings a day Fish, poultry, or lean meat: 1 serving a day Beans and legumes: 2 or more servings a week Nuts and seeds: 1-2 servings a day Whole grains: 6-8 servings a day Extra-virgin olive oil: 3-4 servings a day Limit red meat and sweets to only a few servings a month What are my food choices? Mediterranean diet Recommended Grains: Whole-grain pasta. Brown rice. Bulgar wheat. Polenta. Couscous. Whole-wheat bread. Orpah Cobb. Vegetables: Artichokes. Beets. Broccoli. Cabbage. Carrots. Eggplant. Green beans. Chard. Kale. Spinach. Onions. Leeks. Peas. Squash. Tomatoes. Peppers. Radishes. Fruits: Apples. Apricots. Avocado. Berries. Bananas. Cherries. Dates. Figs. Grapes. Lemons. Melon. Oranges. Peaches. Plums. Pomegranate. Meats and other protein foods: Beans. Almonds. Sunflower seeds. Pine nuts. Peanuts. Cod. Salmon. Scallops. Shrimp. Tuna. Tilapia. Clams. Oysters. Eggs. Dairy: Low-fat milk. Cheese. Greek yogurt. Beverages: Water. Red wine. Herbal tea. Fats and oils: Extra virgin olive oil.  Avocado oil. Grape seed oil. Sweets and desserts: [place] yogurt with honey. Baked apples. Poached pears. Trail mix. Seasoning and other foods: Basil. Cilantro. Coriander. Cumin. Mint. Parsley. Sage. Rosemary. Tarragon. Garlic. Oregano. Thyme. Pepper. Balsalmic vinegar. Tahini. Hummus. Tomato sauce. Olives. Mushrooms. Limit these Grains: Prepackaged pasta or rice dishes. Prepackaged cereal with added sugar. Vegetables: Deep fried potatoes (french fries). Fruits: Fruit canned in syrup. Meats and other protein foods: Beef. Pork. Lamb. Poultry with skin. Hot dogs. Tomasa Blase. Dairy: Ice cream. Sour cream. Whole milk. Beverages: Juice. Sugar-sweetened soft drinks. Beer. Liquor and spirits. Fats and oils: Butter. Canola oil. Vegetable oil. Beef fat (tallow). Lard. Sweets and desserts: Cookies. Cakes. Pies. Candy. Seasoning and other foods: Mayonnaise. Premade sauces and marinades. The items listed may not be a complete list. Talk with your dietitian about what dietary choices are right for you. Summary The Mediterranean diet includes both food and lifestyle choices. Eat a variety of fresh fruits and vegetables, beans, nuts, seeds, and whole grains. Limit the amount of red meat and sweets that you eat. Talk with your health care provider about whether it is safe for you to drink red wine in moderation. This means 1 glass a day for nonpregnant women and 2 glasses a day for men. A glass of wine equals 5 oz (150 mL). This information is not intended to replace advice given to you by your health care provider. Make  sure you discuss any questions you have with your health care provider. Document Released: 02/16/2016 Document Revised: 03/20/2016 Document Reviewed: 02/16/2016 Elsevier Interactive Patient Education  2017 [employer].

## 2023-04-10 ENCOUNTER — Ambulatory Visit: Payer: Medicare Other | Admitting: Physician Assistant

## 2023-04-10 ENCOUNTER — Ambulatory Visit: Payer: Medicare Other

## 2023-04-15 ENCOUNTER — Inpatient Hospital Stay: Payer: Medicare Other

## 2023-04-15 ENCOUNTER — Encounter: Payer: Self-pay | Admitting: Oncology

## 2023-04-15 ENCOUNTER — Inpatient Hospital Stay (HOSPITAL_BASED_OUTPATIENT_CLINIC_OR_DEPARTMENT_OTHER): Payer: Medicare Other | Admitting: Oncology

## 2023-04-15 ENCOUNTER — Inpatient Hospital Stay: Payer: Medicare Other | Attending: Oncology

## 2023-04-15 VITALS — BP 111/57 | HR 55 | Temp 97.5°F | Resp 18 | Wt 128.3 lb

## 2023-04-15 DIAGNOSIS — Z809 Family history of malignant neoplasm, unspecified: Secondary | ICD-10-CM | POA: Diagnosis not present

## 2023-04-15 DIAGNOSIS — C61 Malignant neoplasm of prostate: Secondary | ICD-10-CM

## 2023-04-15 DIAGNOSIS — D631 Anemia in chronic kidney disease: Secondary | ICD-10-CM | POA: Diagnosis not present

## 2023-04-15 DIAGNOSIS — N189 Chronic kidney disease, unspecified: Secondary | ICD-10-CM | POA: Diagnosis not present

## 2023-04-15 DIAGNOSIS — Z87891 Personal history of nicotine dependence: Secondary | ICD-10-CM | POA: Diagnosis not present

## 2023-04-15 DIAGNOSIS — N184 Chronic kidney disease, stage 4 (severe): Secondary | ICD-10-CM

## 2023-04-15 LAB — CBC WITH DIFFERENTIAL/PLATELET
Abs Immature Granulocytes: 0.03 10*3/uL (ref 0.00–0.07)
Basophils Absolute: 0 10*3/uL (ref 0.0–0.1)
Basophils Relative: 0 %
Eosinophils Absolute: 0 10*3/uL (ref 0.0–0.5)
Eosinophils Relative: 1 %
HCT: 31.9 % — ABNORMAL LOW (ref 39.0–52.0)
Hemoglobin: 10.5 g/dL — ABNORMAL LOW (ref 13.0–17.0)
Immature Granulocytes: 1 %
Lymphocytes Relative: 38 %
Lymphs Abs: 1.6 10*3/uL (ref 0.7–4.0)
MCH: 30.5 pg (ref 26.0–34.0)
MCHC: 32.9 g/dL (ref 30.0–36.0)
MCV: 92.7 fL (ref 80.0–100.0)
Monocytes Absolute: 0.4 10*3/uL (ref 0.1–1.0)
Monocytes Relative: 8 %
Neutro Abs: 2.2 10*3/uL (ref 1.7–7.7)
Neutrophils Relative %: 52 %
Platelets: 118 10*3/uL — ABNORMAL LOW (ref 150–400)
RBC: 3.44 MIL/uL — ABNORMAL LOW (ref 4.22–5.81)
RDW: 15.9 % — ABNORMAL HIGH (ref 11.5–15.5)
WBC: 4.2 10*3/uL (ref 4.0–10.5)
nRBC: 0 % (ref 0.0–0.2)

## 2023-04-15 LAB — RETIC PANEL
Immature Retic Fract: 4.4 % (ref 2.3–15.9)
RBC.: 3.42 MIL/uL — ABNORMAL LOW (ref 4.22–5.81)
Retic Count, Absolute: 42.4 10*3/uL (ref 19.0–186.0)
Retic Ct Pct: 1.2 % (ref 0.4–3.1)
Reticulocyte Hemoglobin: 33.5 pg (ref >27.9)

## 2023-04-15 NOTE — Progress Notes (Signed)
Pt here for follow up.

## 2023-04-15 NOTE — Assessment & Plan Note (Signed)
Lab Results  Component Value Date   HGB 10.5 (L) 04/15/2023   TIBC 178 (L) 02/14/2023   IRONPCTSAT 39 02/14/2023   FERRITIN 272 02/14/2023    Ferritin is at goal >200 Recommend Retacrit 10,000 unit every 4 weeks if Hb<10

## 2023-04-15 NOTE — Assessment & Plan Note (Signed)
Encourage oral hydration and avoid nephrotoxins. Follow up with nephrology

## 2023-04-15 NOTE — Assessment & Plan Note (Addendum)
History of prostate cancer, risk progressively increased PSA. Patient denies any aggressive work-up or treatments for his prostate cancer. He is only interested in androgen deprivation therapy.  Clinically he is stable.  Denies any pain. Labs reviewed and discussed with patient and daughter in law.  Eligard 45 mg Q37m, next due Feb 2025

## 2023-04-15 NOTE — Progress Notes (Signed)
Hematology/Oncology Progress note [phone] [phone]   CHIEF COMPLAINTS/REASON FOR VISIT:  Follow up for prostate cancer  ASSESSMENT & PLAN:   Malignant neoplasm of prostate (HCC)  History of prostate cancer, risk progressively increased PSA. Patient denies any aggressive work-up or treatments for his prostate cancer. He is only interested in androgen deprivation therapy.  Clinically he is stable.  Denies any pain. Labs reviewed and discussed with patient and daughter in law.  Eligard 45 mg Q7m, next due Feb 2025  CKD (chronic kidney disease), stage IV (HCC) Encourage oral hydration and avoid nephrotoxins.  Follow up with nephrology  Anemia of chronic kidney failure, unspecified stage Lab Results  Component Value Date   HGB 10.5 (L) 04/15/2023   TIBC 178 (L) 02/14/2023   IRONPCTSAT 39 02/14/2023   FERRITIN 272 02/14/2023    Ferritin is at goal >200 Recommend Retacrit 10,000 unit every 4 weeks if Hb<10  Orders Placed This Encounter  Procedures   CBC with Differential (Cancer Center Only)    Standing Status:   Future    Standing Expiration Date:   04/14/2024   Ferritin    Standing Status:   Future    Standing Expiration Date:   04/14/2024   Iron and TIBC    Standing Status:   Future    Standing Expiration Date:   04/14/2024   Retic Panel    Standing Status:   Future    Standing Expiration Date:   04/14/2024   PSA    Standing Status:   Future    Standing Expiration Date:   04/14/2024   CMP (Cancer Center only)    Standing Status:   Future    Standing Expiration Date:   04/14/2024   Hemoglobin and Hematocrit (Cancer Center Only)    Standing Status:   Standing    Number of Occurrences:   3    Standing Expiration Date:   04/14/2024   Follow up  HH& in 4 weeks, 8 weeks, 12 weeks +/- retacrit.  4 months lab MD +/- retacrit, + Eligard   All questions were answered. The patient knows to call the clinic with any problems, questions or  concerns.  [name], MD, PhD [facility] Hematology Oncology 04/15/2023   HISTORY OF PRESENTING ILLNESS:   ROURKE MCQUITTY is a  89 y.o.  male with PMH listed below was seen in consultation at the request of  Carmina Miller, MD  for evaluation of prostate cancer.   Patient was diagnosed with prostate cancer in 2006. No original pathology report is available in current EMR.  He follows up with Dr.Chrystal. patient is a poor historian.  Per note, he has history of Gleason 7 adenocarcinoma of prostate, previously on pulsatile leupron therapy.  Patient's last Eligard was in December 2023.  Patient had an appointment in June 2023 and this appointment was rescheduled to today. Patient is not interested in any imaging or work-up or aggressive treatments.  He denies any back pain.   06/26/22 CT abdomen pelvis wo contrast  Extensive diverticulosis is again noted involving the descending and sigmoid colon. There is now noted mild wall thickening of the sigmoid colon suggesting possible infectious or inflammatory colitis. Large sliding-type hiatal hernia.  Stable appearance of severe left renal cortical atrophy with associated severe left hydroureteronephrosis due to multiple distal left ureteral calculi.   Severe osteoarthritis of the left hip.Aortic Atherosclerosis   INTERVAL HISTORY DOVBER ERNEST is a 89 y.o. male who has above history reviewed  by me today presents for follow up visit for prostate cancer treatment.  He is on ADT with Eligard every 6 months.  Chronic fatigue at baseline.    Review of Systems  Constitutional:  Negative for appetite change, chills, fatigue, fever and unexpected weight change.  HENT:   Negative for hearing loss and voice change.   Eyes:  Negative for eye problems and icterus.  Respiratory:  Negative for chest tightness, cough and shortness of breath.   Cardiovascular:  Negative for chest pain and leg swelling.  Gastrointestinal:  Negative for abdominal distention  and abdominal pain.  Endocrine: Negative for hot flashes.  Genitourinary:  Negative for difficulty urinating, dysuria and frequency.   Musculoskeletal:  Positive for arthralgias and back pain.  Skin:  Negative for itching and rash.  Neurological:  Negative for light-headedness and numbness.  Hematological:  Negative for adenopathy. Does not bruise/bleed easily.  Psychiatric/Behavioral:  Negative for confusion.     MEDICAL HISTORY:  Past Medical History:  Diagnosis Date   Anemia    Cardiac disease    CHF (congestive heart failure) (HCC)    Choledocholithiasis    Chronic kidney disease (CKD), stage IV (severe) (HCC)    left   Elevated LFTs    Esophageal stenosis    Hiatal hernia 04/03/2019   large   History of blindness    History of diarrhea    History of gallstones    Hypercholesteremia    Hypertension    Hypothyroidism    Malnutrition (HCC)    Osteoporosis    Prostate cancer (HCC)    Sepsis (HCC)    Shingles    Shingles    Skin cancer    right cheek    SURGICAL HISTORY: Past Surgical History:  Procedure Laterality Date   CARDIAC CATHETERIZATION  04/13/2014   CT PERC CHOLECYSTOSTOMY  07/20/2016   Placed at [facility] Interventional Radiology   ENDOSCOPIC RETROGRADE CHOLANGIOPANCREATOGRAPHY (ERCP) WITH PROPOFOL N/A 04/06/2019   Procedure: ENDOSCOPIC RETROGRADE CHOLANGIOPANCREATOGRAPHY (ERCP) WITH PROPOFOL;  Surgeon: Midge Minium, MD;  Location: [facility] ENDOSCOPY;  Service: Endoscopy;  Laterality: N/A;    SOCIAL HISTORY: Social History   Socioeconomic History   Marital status: Married    Spouse name: Not on file   Number of children: Not on file   Years of education: Not on file   Highest education level: Not on file  Occupational History   Not on file  Tobacco Use   Smoking status: Former    Current packs/day: 0.00    Types: Cigarettes, Cigars    Quit date: 76    Years since quitting: 62.8    Passive exposure: Past   Smokeless tobacco: Current    Types: Chew   Vaping Use   Vaping status: Never Used  Substance and Sexual Activity   Alcohol use: Not Currently   Drug use: No   Sexual activity: Not Currently  Other Topics Concern   Not on file  Social History Narrative         Tobacco use, amount per day now: Yes chews tobacco several times.   Past tobacco use, amount per day:   How many years did you use tobacco: 80+ years.   Alcohol use (drinks per week): Past beer drinker.   Diet:   Do you drink/eat things with caffeine: No   Marital status:  Married.  What year were you married?   Do you live in a house, apartment, assisted living, condo, trailer, etc.? House.   Is it one or more stories? 1   How many persons live in your home? 3   Do you have pets in your home?( please list) No.   Highest Level of education completed? High School   Current or past profession: Publishing rights manager.   Do you exercise?  No.                                Type and how often?   Do you have a living will? No   Do you have a DNR form?    No                               If not, do you want to discuss one?   Do you have signed POA/HPOA forms?  No                   If so, please bring to you appointment      Do you have any difficulty bathing or dressing yourself? Yes   Do you have any difficulty preparing food or eating? Yes   Do you have any difficulty managing your medications? Yes   Do you have any difficulty managing your finances? Yes   Do you have any difficulty affording your medications? No         Right handed   One coffee daily,   Lives with wife   [place] home level   retired   International aid/development worker of [employer] investment [employer] Strain: Not on file  Food Insecurity: No Food Insecurity (06/27/2022)   Hunger Vital Sign    Worried About Running Out of Food in the Last Year: Never true    Ran Out of Food in the Last Year: Never true  Transportation Needs: No Transportation Needs (06/27/2022)   PRAPARE -  Administrator, Civil Service (Medical): No    Lack of Transportation (Non-Medical): No  Physical Activity: Not on file  Stress: Not on file  Social Connections: Not on file  Intimate Partner Violence: Not At Risk (06/27/2022)   Humiliation, Afraid, Rape, and Kick questionnaire    Fear of Current or Ex-Partner: No    Emotionally Abused: No    Physically Abused: No    Sexually Abused: No    FAMILY HISTORY: Family History  Problem Relation Age of Onset   CAD Mother    Cancer Mother    Heart attack Father     ALLERGIES:  is allergic to ativan [lorazepam], penicillin g, amlodipine, lisinopril, spironolactone, and tessalon [benzonatate].  MEDICATIONS:  Current Outpatient Medications  Medication Sig Dispense Refill   amiodarone (PACERONE) 200 MG tablet Take 1 tablet (200 mg total) by mouth 2 (two) times daily. (Patient taking differently: Take 200 mg by mouth daily.) 30 tablet 11   apixaban (ELIQUIS) 2.5 MG TABS tablet Take 1 tablet (2.5 mg total) by mouth 2 (two) times daily. 60 tablet 4   atorvastatin (LIPITOR) 40 MG tablet Take 40 mg by mouth at bedtime.      Cholecalciferol (VITAMIN D3) 5000 units TABS Take 5,000 Units by mouth daily.     [employer] DIGESTIVE PROBIOTIC 250 MG capsule Take 250 mg by mouth 2 (two) times daily.  ferrous sulfate 325 (65 FE) MG tablet Take 325 mg by mouth 2 (two) times daily.      levothyroxine (SYNTHROID) 100 MCG tablet Take 100 mcg by mouth daily before breakfast.     mirabegron ER (MYRBETRIQ) 50 MG TB24 tablet Take 1 tablet (50 mg total) by mouth daily. 30 tablet 11   Omega-3 Fatty Acids (FISH OIL) 1200 MG CAPS Take 1,200 mg by mouth 2 (two) times daily.     pantoprazole (PROTONIX) 40 MG tablet Take 40 mg by mouth daily.     potassium chloride (KLOR-CON) 10 MEQ tablet Take 10 mEq by mouth daily.     tamsulosin (FLOMAX) 0.4 MG CAPS capsule Take 0.4 mg by mouth at bedtime.      torsemide (DEMADEX) 20 MG tablet Take 20 mg by mouth daily.      cyanocobalamin (,VITAMIN B-12,) 1000 MCG/ML injection Inject 1,000 mcg into the muscle every 30 (thirty) days. Every 15th of the month. (Patient not taking: Reported on 04/15/2023)     nitroGLYCERIN (NITROSTAT) 0.4 MG SL tablet Place 1 tablet (0.4 mg total) under the tongue every 5 (five) minutes as needed for chest pain. (Patient not taking: Reported on 04/15/2023) 25 tablet 2   sodium bicarbonate 650 MG tablet Take 2 tablets (1,300 mg total) by mouth 3 (three) times daily. (Patient not taking: Reported on 04/15/2023) 90 tablet 1   No current facility-administered medications for this visit.     PHYSICAL EXAMINATION: ECOG PERFORMANCE STATUS: 3 - Symptomatic, >50% confined to bed Vitals:   04/15/23 1416  BP: (!) 111/57  Pulse: (!) 55  Resp: 18  Temp: (!) 97.5 F (36.4 C)   Filed Weights   04/15/23 1416  Weight: 128 lb 4.8 oz (58.2 kg)    Physical Exam Constitutional:      General: He is not in acute distress.    Comments: Frail elderly male patient, he sits in a wheelchair.   HENT:     Head: Normocephalic and atraumatic.  Eyes:     General: No scleral icterus. Cardiovascular:     Rate and Rhythm: Normal rate and regular rhythm.  Pulmonary:     Effort: Pulmonary effort is normal. No respiratory distress.  Abdominal:     General: Bowel sounds are normal. There is no distension.     Palpations: Abdomen is soft.  Musculoskeletal:        General: No deformity. Normal range of motion.     Cervical back: Normal range of motion and neck supple.  Skin:    General: Skin is warm and dry.     Findings: No rash.  Neurological:     Mental Status: He is alert. Mental status is at baseline.  Psychiatric:        Mood and Affect: Mood normal.     LABORATORY DATA:  I have reviewed the data as listed    Latest Ref Rng & Units 04/15/2023    1:45 PM 03/13/2023    1:10 PM 02/14/2023    1:26 PM  CBC  WBC 4.0 - 10.5 K/uL 4.2   3.8   Hemoglobin 13.0 - 17.0 g/dL 11.9  9.8  9.5   Hematocrit  39.0 - 52.0 % 31.9  30.2  28.5   Platelets 150 - 400 K/uL 118   105       Latest Ref Rng & Units 02/14/2023    1:26 PM 11/16/2022   11:52 AM 09/10/2022    4:08 PM  CMP  Glucose  70 - 99 mg/dL 95  91  99   BUN 8 - 23 mg/dL 55  43  51   Creatinine 0.61 - 1.24 mg/dL 1.61  0.96  0.45   Sodium 135 - 145 mmol/L 132  137  140   Potassium 3.5 - 5.1 mmol/L 4.3  3.6  3.5   Chloride 98 - 111 mmol/L 103  106  102   CO2 22 - 32 mmol/L 21  23  20    Calcium 8.9 - 10.3 mg/dL 8.2  8.4  8.3   Total Protein 6.5 - 8.1 g/dL 6.1  5.4    Total Bilirubin 0.3 - 1.2 mg/dL 0.6  0.8    Alkaline Phos 38 - 126 U/L 62  59    AST 15 - 41 U/L 23  15    ALT 0 - 44 U/L 19  14       Iron/TIBC/Ferritin/ %Sat    Component Value Date/Time   IRON 70 02/14/2023 1326   TIBC 178 (L) 02/14/2023 1326   FERRITIN 272 02/14/2023 1326   IRONPCTSAT 39 02/14/2023 1326      RADIOGRAPHIC STUDIES: I have personally reviewed the radiological images as listed and agreed with the findings in the report. No results found.

## 2023-04-17 ENCOUNTER — Telehealth: Payer: Self-pay | Admitting: *Deleted

## 2023-04-17 NOTE — Telephone Encounter (Signed)
Daughter in law Clydie Braun called asking for a DNR and MOST form to have at patient home. I discussed with J Borders, NP and he requested that a telephone visit be setup with patient next week to discuss this with him

## 2023-04-22 ENCOUNTER — Inpatient Hospital Stay (HOSPITAL_BASED_OUTPATIENT_CLINIC_OR_DEPARTMENT_OTHER): Payer: Medicare Other | Admitting: Hospice and Palliative Medicine

## 2023-04-22 DIAGNOSIS — Z515 Encounter for palliative care: Secondary | ICD-10-CM

## 2023-04-22 DIAGNOSIS — C61 Malignant neoplasm of prostate: Secondary | ICD-10-CM

## 2023-04-22 NOTE — Progress Notes (Signed)
I called and spoke with patient's daughter-in-law.  She previously requested assistance with completing DNR/MOST Form.  However, she says that these forms have since been completed by patient's PCP.  Patient is also followed at home by [facility].  Family stated patient is doing okay but they have seen patient more stoic/reserved recently.  They have questioned depression.  We discussed possible options for management but family would like to avoid starting any new medications at this time. Offered referral to [facility] but family would like to hold off for now.

## 2023-05-13 ENCOUNTER — Inpatient Hospital Stay: Payer: Medicare Other | Attending: Oncology

## 2023-05-13 ENCOUNTER — Inpatient Hospital Stay: Payer: Medicare Other

## 2023-05-15 ENCOUNTER — Inpatient Hospital Stay: Payer: Medicare Other | Attending: Oncology

## 2023-05-15 ENCOUNTER — Inpatient Hospital Stay: Payer: Medicare Other

## 2023-05-15 VITALS — BP 110/56

## 2023-05-15 DIAGNOSIS — D631 Anemia in chronic kidney disease: Secondary | ICD-10-CM | POA: Diagnosis present

## 2023-05-15 DIAGNOSIS — C61 Malignant neoplasm of prostate: Secondary | ICD-10-CM

## 2023-05-15 DIAGNOSIS — N184 Chronic kidney disease, stage 4 (severe): Secondary | ICD-10-CM | POA: Diagnosis present

## 2023-05-15 LAB — HEMOGLOBIN AND HEMATOCRIT (CANCER CENTER ONLY)
HCT: 28.9 % — ABNORMAL LOW (ref 39.0–52.0)
Hemoglobin: 9.7 g/dL — ABNORMAL LOW (ref 13.0–17.0)

## 2023-05-15 MED ORDER — EPOETIN ALFA-EPBX 10000 UNIT/ML IJ SOLN
10000.0000 [IU] | Freq: Once | INTRAMUSCULAR | Status: AC
  Administered 2023-05-15: 10000 [IU] via SUBCUTANEOUS
  Filled 2023-05-15: qty 1

## 2023-05-30 ENCOUNTER — Telehealth: Payer: Medicare Other | Admitting: Hospice and Palliative Medicine

## 2023-06-04 ENCOUNTER — Emergency Department (HOSPITAL_COMMUNITY): Payer: Medicare Other

## 2023-06-04 ENCOUNTER — Encounter (HOSPITAL_COMMUNITY): Payer: Self-pay | Admitting: Emergency Medicine

## 2023-06-04 ENCOUNTER — Observation Stay (HOSPITAL_COMMUNITY)
Admission: EM | Admit: 2023-06-04 | Discharge: 2023-06-05 | Disposition: A | Discharge status: Home / Self Care | Payer: Medicare Other | Attending: Internal Medicine | Admitting: Internal Medicine

## 2023-06-04 DIAGNOSIS — G309 Alzheimer's disease, unspecified: Secondary | ICD-10-CM

## 2023-06-04 DIAGNOSIS — R531 Weakness: Principal | ICD-10-CM

## 2023-06-04 DIAGNOSIS — N184 Chronic kidney disease, stage 4 (severe): Secondary | ICD-10-CM | POA: Diagnosis not present

## 2023-06-04 DIAGNOSIS — Z79899 Other long term (current) drug therapy: Secondary | ICD-10-CM | POA: Insufficient documentation

## 2023-06-04 DIAGNOSIS — D61818 Other pancytopenia: Secondary | ICD-10-CM | POA: Insufficient documentation

## 2023-06-04 DIAGNOSIS — N179 Acute kidney failure, unspecified: Secondary | ICD-10-CM | POA: Insufficient documentation

## 2023-06-04 DIAGNOSIS — E8721 Acute metabolic acidosis: Secondary | ICD-10-CM | POA: Diagnosis not present

## 2023-06-04 DIAGNOSIS — I2510 Atherosclerotic heart disease of native coronary artery without angina pectoris: Secondary | ICD-10-CM | POA: Diagnosis not present

## 2023-06-04 DIAGNOSIS — Z1152 Encounter for screening for COVID-19: Secondary | ICD-10-CM | POA: Insufficient documentation

## 2023-06-04 DIAGNOSIS — D631 Anemia in chronic kidney disease: Secondary | ICD-10-CM | POA: Diagnosis not present

## 2023-06-04 DIAGNOSIS — E872 Acidosis, unspecified: Secondary | ICD-10-CM

## 2023-06-04 DIAGNOSIS — I5032 Chronic diastolic (congestive) heart failure: Secondary | ICD-10-CM | POA: Diagnosis not present

## 2023-06-04 DIAGNOSIS — Z87891 Personal history of nicotine dependence: Secondary | ICD-10-CM | POA: Diagnosis not present

## 2023-06-04 DIAGNOSIS — I130 Hypertensive heart and chronic kidney disease with heart failure and stage 1 through stage 4 chronic kidney disease, or unspecified chronic kidney disease: Secondary | ICD-10-CM | POA: Diagnosis not present

## 2023-06-04 DIAGNOSIS — I10 Essential (primary) hypertension: Secondary | ICD-10-CM | POA: Diagnosis present

## 2023-06-04 DIAGNOSIS — I4891 Unspecified atrial fibrillation: Secondary | ICD-10-CM | POA: Diagnosis not present

## 2023-06-04 DIAGNOSIS — Z8546 Personal history of malignant neoplasm of prostate: Secondary | ICD-10-CM | POA: Diagnosis not present

## 2023-06-04 DIAGNOSIS — Z7901 Long term (current) use of anticoagulants: Secondary | ICD-10-CM | POA: Insufficient documentation

## 2023-06-04 DIAGNOSIS — Z515 Encounter for palliative care: Secondary | ICD-10-CM | POA: Insufficient documentation

## 2023-06-04 DIAGNOSIS — C61 Malignant neoplasm of prostate: Secondary | ICD-10-CM | POA: Diagnosis present

## 2023-06-04 DIAGNOSIS — E039 Hypothyroidism, unspecified: Secondary | ICD-10-CM | POA: Diagnosis not present

## 2023-06-04 DIAGNOSIS — R627 Adult failure to thrive: Secondary | ICD-10-CM | POA: Diagnosis not present

## 2023-06-04 DIAGNOSIS — R5383 Other fatigue: Secondary | ICD-10-CM | POA: Diagnosis not present

## 2023-06-04 DIAGNOSIS — F0280 Dementia in other diseases classified elsewhere without behavioral disturbance: Secondary | ICD-10-CM | POA: Diagnosis present

## 2023-06-04 LAB — CBC WITH DIFFERENTIAL/PLATELET
Abs Immature Granulocytes: 0.05 10*3/uL (ref 0.00–0.07)
Basophils Absolute: 0 10*3/uL (ref 0.0–0.1)
Basophils Relative: 0 %
Eosinophils Absolute: 0 10*3/uL (ref 0.0–0.5)
Eosinophils Relative: 1 %
HCT: 28.5 % — ABNORMAL LOW (ref 39.0–52.0)
Hemoglobin: 9.1 g/dL — ABNORMAL LOW (ref 13.0–17.0)
Immature Granulocytes: 1 %
Lymphocytes Relative: 16 %
Lymphs Abs: 0.6 10*3/uL — ABNORMAL LOW (ref 0.7–4.0)
MCH: 29.9 pg (ref 26.0–34.0)
MCHC: 31.9 g/dL (ref 30.0–36.0)
MCV: 93.8 fL (ref 80.0–100.0)
Monocytes Absolute: 0.2 10*3/uL (ref 0.1–1.0)
Monocytes Relative: 5 %
Neutro Abs: 2.9 10*3/uL (ref 1.7–7.7)
Neutrophils Relative %: 77 %
Platelets: 112 10*3/uL — ABNORMAL LOW (ref 150–400)
RBC: 3.04 MIL/uL — ABNORMAL LOW (ref 4.22–5.81)
RDW: 17.1 % — ABNORMAL HIGH (ref 11.5–15.5)
WBC: 3.8 10*3/uL — ABNORMAL LOW (ref 4.0–10.5)
nRBC: 0 % (ref 0.0–0.2)

## 2023-06-04 LAB — I-STAT CHEM 8, ED
BUN: 65 mg/dL — ABNORMAL HIGH (ref 8–23)
Calcium, Ion: 1.26 mmol/L (ref 1.15–1.40)
Chloride: 111 mmol/L (ref 98–111)
Creatinine, Ser: 4.5 mg/dL — ABNORMAL HIGH (ref 0.61–1.24)
Glucose, Bld: 103 mg/dL — ABNORMAL HIGH (ref 70–99)
HCT: 28 % — ABNORMAL LOW (ref 39.0–52.0)
Hemoglobin: 9.5 g/dL — ABNORMAL LOW (ref 13.0–17.0)
Potassium: 3.5 mmol/L (ref 3.5–5.1)
Sodium: 135 mmol/L (ref 135–145)
TCO2: 14 mmol/L — ABNORMAL LOW (ref 22–32)

## 2023-06-04 LAB — URINALYSIS, W/ REFLEX TO CULTURE (INFECTION SUSPECTED)
Bacteria, UA: NONE SEEN
Bilirubin Urine: NEGATIVE
Glucose, UA: NEGATIVE mg/dL
Ketones, ur: NEGATIVE mg/dL
Leukocytes,Ua: NEGATIVE
Nitrite: NEGATIVE
Protein, ur: NEGATIVE mg/dL
Specific Gravity, Urine: 1.012 (ref 1.005–1.030)
pH: 5 (ref 5.0–8.0)

## 2023-06-04 LAB — BRAIN NATRIURETIC PEPTIDE: B Natriuretic Peptide: 39.5 pg/mL (ref 0.0–100.0)

## 2023-06-04 LAB — COMPREHENSIVE METABOLIC PANEL
ALT: 47 U/L — ABNORMAL HIGH (ref 0–44)
AST: 30 U/L (ref 15–41)
Albumin: 2.6 g/dL — ABNORMAL LOW (ref 3.5–5.0)
Alkaline Phosphatase: 60 U/L (ref 38–126)
Anion gap: 10 (ref 5–15)
BUN: 65 mg/dL — ABNORMAL HIGH (ref 8–23)
CO2: 13 mmol/L — ABNORMAL LOW (ref 22–32)
Calcium: 8.8 mg/dL — ABNORMAL LOW (ref 8.9–10.3)
Chloride: 110 mmol/L (ref 98–111)
Creatinine, Ser: 4.21 mg/dL — ABNORMAL HIGH (ref 0.61–1.24)
GFR, Estimated: 12 mL/min — ABNORMAL LOW (ref >60)
Glucose, Bld: 110 mg/dL — ABNORMAL HIGH (ref 70–99)
Potassium: 3.5 mmol/L (ref 3.5–5.1)
Sodium: 133 mmol/L — ABNORMAL LOW (ref 135–145)
Total Bilirubin: 0.8 mg/dL (ref <1.2)
Total Protein: 5.7 g/dL — ABNORMAL LOW (ref 6.5–8.1)

## 2023-06-04 LAB — RESP PANEL BY RT-PCR (RSV, FLU A&B, COVID)  RVPGX2
Influenza A by PCR: NEGATIVE
Influenza B by PCR: NEGATIVE
Resp Syncytial Virus by PCR: NEGATIVE
SARS Coronavirus 2 by RT PCR: NEGATIVE

## 2023-06-04 LAB — TROPONIN I (HIGH SENSITIVITY)
Troponin I (High Sensitivity): 16 ng/L (ref <18)
Troponin I (High Sensitivity): 15 ng/L (ref <18)

## 2023-06-04 LAB — I-STAT CG4 LACTIC ACID, ED
Lactic Acid, Venous: 1.2 mmol/L (ref 0.5–1.9)
Lactic Acid, Venous: 0.6 mmol/L (ref 0.5–1.9)

## 2023-06-04 LAB — PROTIME-INR
INR: 1.3 — ABNORMAL HIGH (ref 0.8–1.2)
Prothrombin Time: 16.8 s — ABNORMAL HIGH (ref 11.4–15.2)

## 2023-06-04 LAB — TSH: TSH: 2.034 u[IU]/mL (ref 0.350–4.500)

## 2023-06-04 LAB — MAGNESIUM: Magnesium: 1.7 mg/dL (ref 1.7–2.4)

## 2023-06-04 MED ORDER — AMIODARONE HCL 200 MG PO TABS
200.0000 mg | ORAL_TABLET | Freq: Every day | ORAL | Status: DC
  Administered 2023-06-04 – 2023-06-05 (×2): 200 mg via ORAL
  Filled 2023-06-04 (×3): qty 1

## 2023-06-04 MED ORDER — TAMSULOSIN HCL 0.4 MG PO CAPS
0.4000 mg | ORAL_CAPSULE | Freq: Every day | ORAL | Status: DC
  Administered 2023-06-04: 0.4 mg via ORAL
  Filled 2023-06-04: qty 1

## 2023-06-04 MED ORDER — MIRABEGRON ER 50 MG PO TB24
50.0000 mg | ORAL_TABLET | Freq: Every day | ORAL | Status: DC
  Administered 2023-06-04 – 2023-06-05 (×2): 50 mg via ORAL
  Filled 2023-06-04 (×2): qty 1

## 2023-06-04 MED ORDER — VITAMIN D 25 MCG (1000 UNIT) PO TABS
5000.0000 [IU] | ORAL_TABLET | Freq: Every day | ORAL | Status: DC
  Administered 2023-06-04 – 2023-06-05 (×2): 5000 [IU] via ORAL
  Filled 2023-06-04 (×3): qty 5

## 2023-06-04 MED ORDER — APIXABAN 2.5 MG PO TABS
2.5000 mg | ORAL_TABLET | Freq: Two times a day (BID) | ORAL | Status: DC
  Administered 2023-06-04 – 2023-06-05 (×2): 2.5 mg via ORAL
  Filled 2023-06-04 (×2): qty 1

## 2023-06-04 MED ORDER — ONDANSETRON HCL 4 MG PO TABS: 4.0000 mg | ORAL_TABLET | Freq: Four times a day (QID) | ORAL | Status: DC | PRN

## 2023-06-04 MED ORDER — LACTATED RINGERS IV BOLUS (SEPSIS)
500.0000 mL | Freq: Once | INTRAVENOUS | Status: AC
  Administered 2023-06-04: 500 mL via INTRAVENOUS

## 2023-06-04 MED ORDER — SODIUM CHLORIDE 0.9% FLUSH: 3.0000 mL | INTRAVENOUS | Status: DC | PRN

## 2023-06-04 MED ORDER — ONDANSETRON HCL 4 MG/2ML IJ SOLN: 4.0000 mg | Freq: Four times a day (QID) | INTRAMUSCULAR | Status: DC | PRN

## 2023-06-04 MED ORDER — SODIUM BICARBONATE 650 MG PO TABS
650.0000 mg | ORAL_TABLET | Freq: Three times a day (TID) | ORAL | Status: DC
  Administered 2023-06-04 – 2023-06-05 (×3): 650 mg via ORAL
  Filled 2023-06-04 (×3): qty 1

## 2023-06-04 MED ORDER — ACETAMINOPHEN 650 MG RE SUPP: 650.0000 mg | Freq: Four times a day (QID) | RECTAL | Status: DC | PRN

## 2023-06-04 MED ORDER — ACETAMINOPHEN 325 MG PO TABS: 650.0000 mg | ORAL_TABLET | Freq: Four times a day (QID) | ORAL | Status: DC | PRN

## 2023-06-04 MED ORDER — SODIUM CHLORIDE 0.9% FLUSH
3.0000 mL | Freq: Two times a day (BID) | INTRAVENOUS | Status: DC
  Administered 2023-06-04 – 2023-06-05 (×2): 3 mL via INTRAVENOUS

## 2023-06-04 MED ORDER — POTASSIUM CHLORIDE CRYS ER 20 MEQ PO TBCR
10.0000 meq | EXTENDED_RELEASE_TABLET | Freq: Every day | ORAL | Status: DC
  Administered 2023-06-04 – 2023-06-05 (×2): 10 meq via ORAL
  Filled 2023-06-04 (×2): qty 1

## 2023-06-04 MED ORDER — SODIUM CHLORIDE 0.9 % IV SOLN: 250.0000 mL | INTRAVENOUS | Status: DC | PRN

## 2023-06-04 MED ORDER — LEVOTHYROXINE SODIUM 100 MCG PO TABS
100.0000 ug | ORAL_TABLET | Freq: Every day | ORAL | Status: DC
Start: 2023-06-05 — End: 2023-06-05
  Administered 2023-06-05: 100 ug via ORAL
  Filled 2023-06-04: qty 1

## 2023-06-04 MED ORDER — NITROGLYCERIN 0.4 MG SL SUBL: 0.4000 mg | SUBLINGUAL_TABLET | SUBLINGUAL | Status: DC | PRN

## 2023-06-04 MED ORDER — PANTOPRAZOLE SODIUM 40 MG PO TBEC
40.0000 mg | DELAYED_RELEASE_TABLET | Freq: Every day | ORAL | Status: DC
  Administered 2023-06-04 – 2023-06-05 (×2): 40 mg via ORAL
  Filled 2023-06-04 (×2): qty 1

## 2023-06-04 MED ORDER — SACCHAROMYCES BOULARDII 250 MG PO CAPS
250.0000 mg | ORAL_CAPSULE | Freq: Two times a day (BID) | ORAL | Status: DC
  Administered 2023-06-04 – 2023-06-05 (×2): 250 mg via ORAL
  Filled 2023-06-04 (×4): qty 1

## 2023-06-04 MED ORDER — ALBUTEROL SULFATE (2.5 MG/3ML) 0.083% IN NEBU: 3.0000 mL | INHALATION_SOLUTION | Freq: Four times a day (QID) | RESPIRATORY_TRACT | Status: DC | PRN

## 2023-06-04 MED ORDER — SODIUM CHLORIDE 0.9% FLUSH
3.0000 mL | Freq: Two times a day (BID) | INTRAVENOUS | Status: DC
  Administered 2023-06-04: 3 mL via INTRAVENOUS

## 2023-06-04 MED ORDER — DOXYCYCLINE HYCLATE 100 MG PO TABS
100.0000 mg | ORAL_TABLET | Freq: Two times a day (BID) | ORAL | Status: DC
  Administered 2023-06-04 – 2023-06-05 (×2): 100 mg via ORAL
  Filled 2023-06-04 (×2): qty 1

## 2023-06-04 NOTE — H&P (Signed)
[facility] History and Physical  [name] WGN:[id number] DOB: April 26, 1927 DOA: 06/04/2023  Referring physician: ED  PCP: Pcp, No   Patient is coming from: Home   Chief Complaint: Generalized weakness.  HPI:  Patient is a 89 years old male with past medical history of prostate cancer, anemia, stage IV CKD, hypertension, protein calorie malnutrition, failure to thrive, CAD, CHF, atrial fibrillation and dementia who lives with his family at home presented to hospital with progressive generalized weakness for last 2 weeks with increasing fatigue since he got his flu shot 2 weeks back.  At baseline patient was able to ambulate with assistance and wheelchair but has been requiring more assistance recently, unable to ambulate and go to the bathroom on his own.  He does have macular degeneration limiting his mobility.  Patient lives with his wife and has been taken care of by caregivers at home.  Due to this progressive weakness patient was brought into the hospital.  Patient's daughter-in-law at bedside present reported no fever chills.  He does have a mild cough at baseline.  He normally had been having good appetite but recently for the 2 weeks has not been eating much.  Has been urinating okay.  In the ED patient was noted to have stable vitals.  Patient was afebrile urinalysis was negative for infection.  TSH of 2.0.  BMP showed mild hyponatremia with sodium of 133 with creatinine elevated at 4.2 with bicarb low at 13.  COVID influenza and RSV was negative.  CBC showed mild pancytopenia with a hemoglobin of 9.1 and platelet of 112.  Lactate was 1.2.  Nephrology was consulted from the ED who recommended resuming bicarb tablets.  Patient was then considered for admission to the hospital for further evaluation and treatment for progressive weakness possible PT evaluation and palliative care reevaluation..   Assessment and plan.  Principal Problem:   Generalized weakness Active  Problems:   Chronic kidney disease, stage IV (severe) (HCC)   Chronic diastolic (congestive) heart failure (HCC)   Benign essential HTN   Malignant neoplasm of prostate (HCC)   Dementia due to Alzheimer's disease (HCC)    AKI on CKD stage IV with metabolic acidosis..  Creatinine on presentation at 4.2 from baseline around 3.0.  ED provider spoke with nephrology Dr. Malen Gauze who recommended sodium bicarb tablet.  No recommendations for IV fluids. Check BMP in AM.  Nephrology to follow in AM.  Hold torsemide today.  Patient was supposed to be on sodium bicarb tablet at home   Will continue daily weights and intake and output charting.  Generalized weakness, failure to thrive, fatigue, ambulatory dysfunction. History of prostate cancer.   Could be multifactorial from advancing age, progressive CKD, dementia. Get PT OT evaluation.  Very low albumin at 2.6.  Hold Lipitor from home.  Hold torsemide from home.  Continue tamsulosin.  TSH within normal range.  Possible pulmonary edema/infiltrate/cannot rule out atypical pneumonia.  On doxycycline orally.  Will continue for now.  Avoiding IV fluids for now.   Review of previous 2D echocardiogram from 5 months back showed preserved LV function.  Pancytopenia,  anemia likely secondary to chronic kidney disease.  Likely contributing to fatigue and weakness.  No evidence of bleeding.  History of dementia.  Continue supportive care.  Will continue fall precautions aspiration precautions.  Delirium precautions.  Under palliative care.  Would benefit from hospice level of care.  History of atrial fibrillation.  On amiodarone and Eliquis.  Will continue.  Hypothyroidism.  Continue Synthroid.  Debility deconditioning failure to thrive. Goals of care discussion.   Patient being followed by palliative care as outpatient.  Will consult palliative care while in the hospital.  Community palliative care was following the patient and were planning to discuss about  hospice.  Patient would benefit from hospice set up at discharge.  Will consult TOC for setting it up.  Spoke with the patient's daughter-in-law about goals of care and disposition plan.   DVT Prophylaxis: On Eliquis  Review of Systems:  All systems were reviewed and were negative unless otherwise mentioned in the HPI   Past Medical History:  Diagnosis Date   Anemia    Cardiac disease    CHF (congestive heart failure) (HCC)    Choledocholithiasis    Chronic kidney disease (CKD), stage IV (severe) (HCC)    left   Elevated LFTs    Esophageal stenosis    Hiatal hernia 04/03/2019   large   History of blindness    History of diarrhea    History of gallstones    Hypercholesteremia    Hypertension    Hypothyroidism    Malnutrition (HCC)    Osteoporosis    Prostate cancer (HCC)    Sepsis (HCC)    Shingles    Shingles    Skin cancer    right cheek   Past Surgical History:  Procedure Laterality Date   CARDIAC CATHETERIZATION  04/13/2014   CT PERC CHOLECYSTOSTOMY  07/20/2016   Placed at [facility] Interventional Radiology   ENDOSCOPIC RETROGRADE CHOLANGIOPANCREATOGRAPHY (ERCP) WITH PROPOFOL N/A 04/06/2019   Procedure: ENDOSCOPIC RETROGRADE CHOLANGIOPANCREATOGRAPHY (ERCP) WITH PROPOFOL;  Surgeon: Midge Minium, MD;  Location: [facility] ENDOSCOPY;  Service: Endoscopy;  Laterality: N/A;    Social History:  reports that he quit smoking about 62 years ago. His smoking use included cigarettes and cigars. He has been exposed to tobacco smoke. His smokeless tobacco use includes chew. He reports that he does not currently use alcohol. He reports that he does not use drugs.  Allergies  Allergen Reactions   Ativan [Lorazepam] Other (See Comments)    Hallucinations, combative   Penicillin G Other (See Comments)    Has patient had a PCN reaction causing immediate rash, facial/tongue/throat swelling, SOB or lightheadedness with hypotension: Yes Has patient had a PCN reaction causing severe rash  involving mucus membranes or skin necrosis: No Has patient had a PCN reaction that required hospitalization No Has patient had a PCN reaction occurring within the last 10 years: No If all of the above answers are "NO", then may proceed with Cephalosporin use.    Amlodipine Swelling   Lisinopril Other (See Comments)   Spironolactone Other (See Comments)   Tessalon [Benzonatate]     Family History  Problem Relation Age of Onset   CAD Mother    Cancer Mother    Heart attack Father      Prior to Admission medications   Medication Sig Start Date End Date Taking? Authorizing Provider  amiodarone (PACERONE) 200 MG tablet Take 1 tablet (200 mg total) by mouth 2 (two) times daily. Patient taking differently: Take 200 mg by mouth daily. 11/22/22 11/22/23  Laurier Nancy, MD  apixaban (ELIQUIS) 2.5 MG TABS tablet Take 1 tablet (2.5 mg total) by mouth 2 (two) times daily. 01/17/23 01/17/24  Laurier Nancy, MD  atorvastatin (LIPITOR) 40 MG tablet Take 40 mg by mouth at bedtime.     [provider]  Cholecalciferol (VITAMIN D3) 5000 units TABS Take 5,000  Units by mouth daily.    [provider]  [employer] DIGESTIVE PROBIOTIC 250 MG capsule Take 250 mg by mouth 2 (two) times daily. 04/20/22   [provider]  cyanocobalamin (,VITAMIN B-12,) 1000 MCG/ML injection Inject 1,000 mcg into the muscle every 30 (thirty) days. Every 15th of the month. Patient not taking: Reported on 04/15/2023 11/08/21   [provider]  ferrous sulfate 325 (65 FE) MG tablet Take 325 mg by mouth 2 (two) times daily.     [provider]  levothyroxine (SYNTHROID) 100 MCG tablet Take 100 mcg by mouth daily before breakfast. 05/18/22   [provider]  mirabegron ER (MYRBETRIQ) 50 MG TB24 tablet Take 1 tablet (50 mg total) by mouth daily. 10/30/22   Sondra Come, MD  nitroGLYCERIN (NITROSTAT) 0.4 MG SL tablet Place 1 tablet (0.4 mg total) under the tongue every 5 (five) minutes as  needed for chest pain. Patient not taking: Reported on 04/15/2023 12/17/22   Laurier Nancy, MD  Omega-3 Fatty Acids (FISH OIL) 1200 MG CAPS Take 1,200 mg by mouth 2 (two) times daily.    [provider]  pantoprazole (PROTONIX) 40 MG tablet Take 40 mg by mouth daily.    [provider]  potassium chloride (KLOR-CON) 10 MEQ tablet Take 10 mEq by mouth daily.    [provider]  sodium bicarbonate 650 MG tablet Take 2 tablets (1,300 mg total) by mouth 3 (three) times daily. Patient not taking: Reported on 04/15/2023 06/29/22   Arnetha Courser, MD  tamsulosin (FLOMAX) 0.4 MG CAPS capsule Take 0.4 mg by mouth at bedtime.     [provider]  torsemide (DEMADEX) 20 MG tablet Take 20 mg by mouth daily.    [provider]    Physical Exam: Vitals:   06/04/23 1145 06/04/23 1149 06/04/23 1529 06/04/23 1715  BP: (!) 100/52   (!) 107/44  Pulse:  95  (!) 58  Resp: 20   15  Temp: (!) 97.5 F (36.4 C)  (!) 97.5 F (36.4 C)   TempSrc: Oral  Oral   SpO2:  99%  97%   Wt Readings from Last 3 Encounters:  04/15/23 58.2 kg  04/01/23 59 kg  03/18/23 60.3 kg   There is no height or weight on file to calculate BMI.  General:  Average built, not in obvious distress appears chronically ill, frail deconditioned, awake and Communicative HENT: Normocephalic, No scleral pallor or icterus noted. Oral mucosa is moist.  Chest: Diminished breath sounds bilaterally. CVS: S1 &S2 heard.  Regular rate and rhythm. Abdomen: Soft, nontender, nondistended.  Bowel sounds are heard. No abdominal mass palpated Extremities: No cyanosis, clubbing or edema.  Peripheral pulses are palpable. Psych: Alert, awake and mildly Communicative, sleepy at times, CNS: Generalized weakness of the extremities. Skin: Warm and dry.  Skin turgor diminished.  Labs on Admission:   CBC: Recent Labs  Lab 06/04/23 1128 06/04/23 1205  WBC 3.8*  --   NEUTROABS 2.9  --   HGB 9.1* 9.5*  HCT 28.5*  28.0*  MCV 93.8  --   PLT 112*  --     Basic Metabolic Panel: Recent Labs  Lab 06/04/23 1128 06/04/23 1205  NA 133* 135  K 3.5 3.5  CL 110 111  CO2 13*  --   GLUCOSE 110* 103*  BUN 65* 65*  CREATININE 4.21* 4.50*  CALCIUM 8.8*  --   MG 1.7  --     Liver Function Tests:  Recent Labs  Lab 06/04/23 1128  AST 30  ALT 47*  ALKPHOS 60  BILITOT 0.8  PROT 5.7*  ALBUMIN 2.6*   No results for input(s): "LIPASE", "AMYLASE" in the last 168 hours. No results for input(s): "AMMONIA" in the last 168 hours.  Cardiac Enzymes: No results for input(s): "CKTOTAL", "CKMB", "CKMBINDEX", "TROPONINI" in the last 168 hours.  BNP (last 3 results) Recent Labs    06/04/23 1128  BNP 39.5    ProBNP (last 3 results) No results for input(s): "PROBNP" in the last 8760 hours.  CBG: No results for input(s): "GLUCAP" in the last 168 hours.  Lipase     Component Value Date/Time   LIPASE 26 06/26/2022 1233     Urinalysis    Component Value Date/Time   COLORURINE YELLOW 06/04/2023 1117   APPEARANCEUR CLEAR 06/04/2023 1117   LABSPEC 1.012 06/04/2023 1117   PHURINE 5.0 06/04/2023 1117   GLUCOSEU NEGATIVE 06/04/2023 1117   HGBUR SMALL (A) 06/04/2023 1117   BILIRUBINUR NEGATIVE 06/04/2023 1117   BILIRUBINUR negative 03/03/2022 1539   KETONESUR NEGATIVE 06/04/2023 1117   PROTEINUR NEGATIVE 06/04/2023 1117   UROBILINOGEN 0.2 03/03/2022 1539   UROBILINOGEN 0.2 12/02/2010 0107   NITRITE NEGATIVE 06/04/2023 1117   LEUKOCYTESUR NEGATIVE 06/04/2023 1117     Drugs of Abuse     Component Value Date/Time   LABOPIA POSITIVE (A) 06/12/2022 2035   COCAINSCRNUR NONE DETECTED 06/12/2022 2035   LABBENZ NONE DETECTED 06/12/2022 2035   AMPHETMU NONE DETECTED 06/12/2022 2035   THCU NONE DETECTED 06/12/2022 2035   LABBARB NONE DETECTED 06/12/2022 2035      Radiological Exams on Admission: CT CHEST ABDOMEN PELVIS WO CONTRAST  Result Date: 06/04/2023 CLINICAL DATA:  Unintentional weight  loss. Generalized weakness. Recent flu shot and patient feels tired. EXAM: CT CHEST, ABDOMEN AND PELVIS WITHOUT CONTRAST TECHNIQUE: Multidetector CT imaging of the chest, abdomen and pelvis was performed following the standard protocol without IV contrast. RADIATION DOSE REDUCTION: This exam was performed according to the departmental dose-optimization program which includes automated exposure control, adjustment of the mA and/or kV according to patient size and/or use of iterative reconstruction technique. COMPARISON:  CT abdomen pelvis dated 01/25/2023. FINDINGS: Evaluation of this exam is limited in the absence of intravenous contrast. CT CHEST FINDINGS Cardiovascular: Mild cardiomegaly. No pericardial effusion. Three-vessel coronary vascular calcification. There is moderate atherosclerotic calcification of the thoracic aorta. No aneurysmal dilatation. The central pulmonary arteries are grossly unremarkable on this noncontrast CT. Mediastinum/Nodes: No hilar or mediastinal adenopathy. There is a large hiatal hernia containing the majority of the stomach. The esophagus is grossly unremarkable. No mediastinal fluid collection. Lungs/Pleura: Trace bilateral pleural effusions, decreased since the prior CT. There is diffuse interstitial and interlobular septal prominence suggestive of edema. Atypical pneumonia is not excluded. Clinical correlation is recommended. Small left upper lobe calcified granuloma. No pneumothorax. The central airways are patent. Musculoskeletal: Osteopenia with degenerative changes of the spine. Old healed left rib fractures. Age indeterminate compression fracture of superior endplate of T1 with approximately 40% loss of vertebral body height anteriorly. Correlation with clinical exam and point tenderness recommended. There is a minimally displaced fracture of the posterior left twelfth rib, new since the prior CT. CT ABDOMEN PELVIS FINDINGS No intra-abdominal free air or free fluid.  Hepatobiliary: The liver is unremarkable. No biliary dilatation. The gallbladder is unremarkable. Pancreas: Unremarkable. No pancreatic ductal dilatation or surrounding inflammatory changes. Spleen: Normal in size without focal abnormality. Adrenals/Urinary Tract: The adrenal glands unremarkable. Similar  appearance of chronic severe left hydronephrosis and moderate left hydroureter. There is severe left renal parenchyma atrophy. An 18 mm nonobstructing stone in the upper pole of the left kidney in similar position. There is no hydronephrosis or nephrolithiasis on the right. The right ureter appears unremarkable. There is trabeculated appearance of the bladder wall likely related to chronic bladder outlet obstruction. Stomach/Bowel: There is sigmoid diverticulosis without active inflammatory changes. There is no bowel obstruction or active inflammation. The appendix is normal. Vascular/Lymphatic: Moderate aortoiliac atherosclerotic disease. The IVC is unremarkable. No portal venous gas. There is no adenopathy. There is mild haziness of the mesentery with multiple top-normal lymph nodes with a "misty mesentery" appearance. This finding is nonspecific but may be related to underlying inflammatory/infectious etiology. Reproductive: The prostate and seminal vesicles are grossly unremarkable. No pelvic mass. Other: None Musculoskeletal: Osteopenia with degenerative changes of the spine. Severe arthritic changes of the left hip with bone-on-bone contact. Healing fracture of inferior right pubic ramus. No acute osseous pathology. IMPRESSION: 1. Mild cardiomegaly with trace bilateral pleural effusions and diffuse interstitial and interlobular septal prominence suggestive of edema. Atypical pneumonia is not excluded. 2. Age indeterminate compression fracture of superior endplate of T1. Correlation with clinical exam and point tenderness recommended. 3. Minimally displaced fracture of the posterior left twelfth rib, new since  the prior CT. 4. Sigmoid diverticulosis. No bowel obstruction. Normal appendix. 5. Similar appearance of chronic severe left hydronephrosis and moderate left hydroureter with severe left renal parenchyma atrophy. 6. An 18 mm nonobstructing stone in the upper pole of the left kidney. 7.  Aortic Atherosclerosis (ICD10-I70.0). Electronically Signed   By: Elgie Collard M.D.   On: 06/04/2023 16:23   CT Head Wo Contrast  Result Date: 06/04/2023 CLINICAL DATA:  Mental status change, unknown cause. Generalized weakness and fatigue. EXAM: CT HEAD WITHOUT CONTRAST TECHNIQUE: Contiguous axial images were obtained from the base of the skull through the vertex without intravenous contrast. RADIATION DOSE REDUCTION: This exam was performed according to the departmental dose-optimization program which includes automated exposure control, adjustment of the mA and/or kV according to patient size and/or use of iterative reconstruction technique. COMPARISON:  Head CT 11/16/2022. FINDINGS: Brain: No acute hemorrhage. 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 Cortical gray-white differentiation is otherwise preserved. Prominence of the ventricles and sulci within expected range for age. No hydrocephalus or extra-axial collection. No mass effect or midline shift. Vascular: No hyperdense vessel or unexpected calcification. Skull: No calvarial fracture or suspicious bone lesion. Skull base is unremarkable. Sinuses/Orbits: No acute finding. Other: None. IMPRESSION: 1. No acute intracranial abnormality. 2. 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 Electronically Signed   By: Orvan Falconer M.D.   On: 06/04/2023 15:13   DG Chest Port 1 View  Result Date: 06/04/2023 CLINICAL DATA:  Questionable sepsis.  Evaluate for abnormality. EXAM: PORTABLE CHEST 1 VIEW COMPARISON:  Chest radiograph 11/16/2022 CT abdomen  01/25/2023 FINDINGS: There appears to be a large hiatal hernia. Hazy densities in the right lower lung may represent atelectasis but indeterminate. Coarse lung markings appear to be chronic. Heart size is grossly stable. IMPRESSION: 1. Hazy densities in the right lower lung. Findings could represent atelectasis but indeterminate. 2. Large hiatal hernia. Electronically Signed   By: Richarda Overlie M.D.   On: 06/04/2023 13:21    EKG: Personally reviewed by me which shows sinus rhythm.   Consultant:  Nephrology.   Will consult palliative  care  Code Status: DNR/DNI patient has MOST form at bedside.  Microbiology none  Antibiotics: Oral doxycycline  Family Communication:   Patients' condition and plan of care including tests being ordered have been discussed with the patient and the patient's daughter-in-law Ms. Clydie Braun, health power of attorney, indicate understanding and agree with the plan.   Status is: Observation   Severity of Illness: The appropriate patient status for this patient is OBSERVATION. Observation status is judged to be reasonable and necessary in order to provide the required intensity of service to ensure the patient's safety. The patient's presenting symptoms, physical exam findings, and initial radiographic and laboratory data in the context of their medical condition is felt to place them at decreased risk for further clinical deterioration. Furthermore, it is anticipated that the patient will be medically stable for discharge from the hospital within 2 midnights of admission.   Signed, Joycelyn Das, MD [facility] 06/04/2023

## 2023-06-04 NOTE — ED Provider Notes (Signed)
[place] [facility] AT [facility] Provider Note   CSN: [id number] Arrival date & time: 06/04/23  1102     History  Chief Complaint  Patient presents with   Weakness    [name] is a 89 y.o. male.  HPI Patient presents for fatigue and generalized weakness.  Medical includes prostate cancer, anemia, CKD, HTN, nephrolithiasis, malnutrition, CAD, CHF, atrial fibrillation, dementia.  He lives at home with family.  Family reports that he got a flu shot 2 weeks ago.  They feel that he has had worsening fatigue since then.  At baseline, he is able to ambulate with assistance.  He has required increased assistance lately.  He arrives via EMS.  EMS noted normal CBG and normal vital signs prior to arrival.  Patient currently denies any physical complaints.    Home Medications Prior to Admission medications   Medication Sig Start Date End Date Taking? Authorizing Provider  amiodarone (PACERONE) 200 MG tablet Take 1 tablet (200 mg total) by mouth 2 (two) times daily. Patient taking differently: Take 200 mg by mouth daily. 11/22/22 11/22/23  Laurier Nancy, MD  apixaban (ELIQUIS) 2.5 MG TABS tablet Take 1 tablet (2.5 mg total) by mouth 2 (two) times daily. 01/17/23 01/17/24  Laurier Nancy, MD  atorvastatin (LIPITOR) 40 MG tablet Take 40 mg by mouth at bedtime.     [provider]  Cholecalciferol (VITAMIN D3) 5000 units TABS Take 5,000 Units by mouth daily.    [provider]  [employer] DIGESTIVE PROBIOTIC 250 MG capsule Take 250 mg by mouth 2 (two) times daily. 04/20/22   [provider]  cyanocobalamin (,VITAMIN B-12,) 1000 MCG/ML injection Inject 1,000 mcg into the muscle every 30 (thirty) days. Every 15th of the month. Patient not taking: Reported on 04/15/2023 11/08/21   [provider]  ferrous sulfate 325 (65 FE) MG tablet Take 325 mg by mouth 2 (two) times daily.     [provider]  levothyroxine (SYNTHROID) 100 MCG tablet  Take 100 mcg by mouth daily before breakfast. 05/18/22   [provider]  mirabegron ER (MYRBETRIQ) 50 MG TB24 tablet Take 1 tablet (50 mg total) by mouth daily. 10/30/22   Sondra Come, MD  nitroGLYCERIN (NITROSTAT) 0.4 MG SL tablet Place 1 tablet (0.4 mg total) under the tongue every 5 (five) minutes as needed for chest pain. Patient not taking: Reported on 04/15/2023 12/17/22   Laurier Nancy, MD  Omega-3 Fatty Acids (FISH OIL) 1200 MG CAPS Take 1,200 mg by mouth 2 (two) times daily.    [provider]  pantoprazole (PROTONIX) 40 MG tablet Take 40 mg by mouth daily.    [provider]  potassium chloride (KLOR-CON) 10 MEQ tablet Take 10 mEq by mouth daily.    [provider]  sodium bicarbonate 650 MG tablet Take 2 tablets (1,300 mg total) by mouth 3 (three) times daily. Patient not taking: Reported on 04/15/2023 06/29/22   Arnetha Courser, MD  tamsulosin (FLOMAX) 0.4 MG CAPS capsule Take 0.4 mg by mouth at bedtime.     [provider]  torsemide (DEMADEX) 20 MG tablet Take 20 mg by mouth daily.    [provider]      Allergies    Ativan [lorazepam], Penicillin g, Amlodipine, Lisinopril, Spironolactone, and Tessalon [benzonatate]    Review of Systems   Review of Systems  Unable to perform ROS: Dementia    Physical Exam Updated Vital Signs BP Marland Kitchen)  100/52 (BP Location: Right Arm)   Pulse 95   Temp (!) 97.5 F (36.4 C) (Oral)   Resp 20   SpO2 99%  Physical Exam Vitals and nursing note reviewed.  Constitutional:      General: He is not in acute distress.    Appearance: Normal appearance. He is well-developed. He is not ill-appearing, toxic-appearing or diaphoretic.  HENT:     Head: Normocephalic and atraumatic.     Right Ear: External ear normal.     Left Ear: External ear normal.     Nose: Nose normal.     Mouth/Throat:     Mouth: Mucous membranes are moist.  Eyes:     Extraocular Movements: Extraocular movements intact.      Conjunctiva/sclera: Conjunctivae normal.  Cardiovascular:     Rate and Rhythm: Normal rate and regular rhythm.     Heart sounds: No murmur heard. Pulmonary:     Effort: Pulmonary effort is normal. No respiratory distress.     Breath sounds: Normal breath sounds. No wheezing or rales.  Chest:     Chest wall: No tenderness.  Abdominal:     General: There is no distension.     Palpations: Abdomen is soft.     Tenderness: There is abdominal tenderness.  Musculoskeletal:        General: No swelling. Normal range of motion.     Cervical back: Normal range of motion and neck supple.  Skin:    General: Skin is warm and dry.     Coloration: Skin is not jaundiced or pale.  Neurological:     General: No focal deficit present.     Mental Status: He is alert and oriented to person, place, and time.  Psychiatric:        Mood and Affect: Mood normal.        Behavior: Behavior normal.     ED Results / Procedures / Treatments   Labs (all labs ordered are listed, but only abnormal results are displayed) Labs Reviewed  COMPREHENSIVE METABOLIC PANEL - Abnormal; Notable for the following components:      Result Value   Sodium 133 (*)    CO2 13 (*)    Glucose, Bld 110 (*)    BUN 65 (*)    Creatinine, Ser 4.21 (*)    Calcium 8.8 (*)    Total Protein 5.7 (*)    Albumin 2.6 (*)    ALT 47 (*)    GFR, Estimated 12 (*)    All other components within normal limits  CBC WITH DIFFERENTIAL/PLATELET - Abnormal; Notable for the following components:   WBC 3.8 (*)    RBC 3.04 (*)    Hemoglobin 9.1 (*)    HCT 28.5 (*)    RDW 17.1 (*)    Platelets 112 (*)    Lymphs Abs 0.6 (*)    All other components within normal limits  PROTIME-INR - Abnormal; Notable for the following components:   Prothrombin Time 16.8 (*)    INR 1.3 (*)    All other components within normal limits  URINALYSIS, W/ REFLEX TO CULTURE (INFECTION SUSPECTED) - Abnormal; Notable for the following components:   Hgb urine  dipstick SMALL (*)    All other components within normal limits  I-STAT CHEM 8, ED - Abnormal; Notable for the following components:   BUN 65 (*)    Creatinine, Ser 4.50 (*)    Glucose, Bld 103 (*)    TCO2 14 (*)  Hemoglobin 9.5 (*)    HCT 28.0 (*)    All other components within normal limits  RESP PANEL BY RT-PCR (RSV, FLU A&B, COVID)  RVPGX2  MAGNESIUM  TSH  BRAIN NATRIURETIC PEPTIDE  I-STAT CG4 LACTIC ACID, ED  I-STAT CG4 LACTIC ACID, ED  TROPONIN I (HIGH SENSITIVITY)  TROPONIN I (HIGH SENSITIVITY)    EKG EKG Interpretation Date/Time:  Tuesday June 04 2023 11:22:36 EST Ventricular Rate:  66 PR Interval:  247 QRS Duration:  172 QT Interval:  452 QTC Calculation: 474 R Axis:   74  Text Interpretation: Sinus or ectopic atrial rhythm Prolonged PR interval Right bundle branch block Confirmed by Gloris Manchester [phone]) on 06/04/2023 1:54:56 PM  Radiology CT CHEST ABDOMEN PELVIS WO CONTRAST  Result Date: 06/04/2023 CLINICAL DATA:  Unintentional weight loss. Generalized weakness. Recent flu shot and patient feels tired. EXAM: CT CHEST, ABDOMEN AND PELVIS WITHOUT CONTRAST TECHNIQUE: Multidetector CT imaging of the chest, abdomen and pelvis was performed following the standard protocol without IV contrast. RADIATION DOSE REDUCTION: This exam was performed according to the departmental dose-optimization program which includes automated exposure control, adjustment of the mA and/or kV according to patient size and/or use of iterative reconstruction technique. COMPARISON:  CT abdomen pelvis dated 01/25/2023. FINDINGS: Evaluation of this exam is limited in the absence of intravenous contrast. CT CHEST FINDINGS Cardiovascular: Mild cardiomegaly. No pericardial effusion. Three-vessel coronary vascular calcification. There is moderate atherosclerotic calcification of the thoracic aorta. No aneurysmal dilatation. The central pulmonary arteries are grossly unremarkable on this noncontrast CT.  Mediastinum/Nodes: No hilar or mediastinal adenopathy. There is a large hiatal hernia containing the majority of the stomach. The esophagus is grossly unremarkable. No mediastinal fluid collection. Lungs/Pleura: Trace bilateral pleural effusions, decreased since the prior CT. There is diffuse interstitial and interlobular septal prominence suggestive of edema. Atypical pneumonia is not excluded. Clinical correlation is recommended. Small left upper lobe calcified granuloma. No pneumothorax. The central airways are patent. Musculoskeletal: Osteopenia with degenerative changes of the spine. Old healed left rib fractures. Age indeterminate compression fracture of superior endplate of T1 with approximately 40% loss of vertebral body height anteriorly. Correlation with clinical exam and point tenderness recommended. There is a minimally displaced fracture of the posterior left twelfth rib, new since the prior CT. CT ABDOMEN PELVIS FINDINGS No intra-abdominal free air or free fluid. Hepatobiliary: The liver is unremarkable. No biliary dilatation. The gallbladder is unremarkable. Pancreas: Unremarkable. No pancreatic ductal dilatation or surrounding inflammatory changes. Spleen: Normal in size without focal abnormality. Adrenals/Urinary Tract: The adrenal glands unremarkable. Similar appearance of chronic severe left hydronephrosis and moderate left hydroureter. There is severe left renal parenchyma atrophy. An 18 mm nonobstructing stone in the upper pole of the left kidney in similar position. There is no hydronephrosis or nephrolithiasis on the right. The right ureter appears unremarkable. There is trabeculated appearance of the bladder wall likely related to chronic bladder outlet obstruction. Stomach/Bowel: There is sigmoid diverticulosis without active inflammatory changes. There is no bowel obstruction or active inflammation. The appendix is normal. Vascular/Lymphatic: Moderate aortoiliac atherosclerotic disease. The  IVC is unremarkable. No portal venous gas. There is no adenopathy. There is mild haziness of the mesentery with multiple top-normal lymph nodes with a "misty mesentery" appearance. This finding is nonspecific but may be related to underlying inflammatory/infectious etiology. Reproductive: The prostate and seminal vesicles are grossly unremarkable. No pelvic mass. Other: None Musculoskeletal: Osteopenia with degenerative changes of the spine. Severe arthritic changes of the left hip with bone-on-bone  contact. Healing fracture of inferior right pubic ramus. No acute osseous pathology. IMPRESSION: 1. Mild cardiomegaly with trace bilateral pleural effusions and diffuse interstitial and interlobular septal prominence suggestive of edema. Atypical pneumonia is not excluded. 2. Age indeterminate compression fracture of superior endplate of T1. Correlation with clinical exam and point tenderness recommended. 3. Minimally displaced fracture of the posterior left twelfth rib, new since the prior CT. 4. Sigmoid diverticulosis. No bowel obstruction. Normal appendix. 5. Similar appearance of chronic severe left hydronephrosis and moderate left hydroureter with severe left renal parenchyma atrophy. 6. An 18 mm nonobstructing stone in the upper pole of the left kidney. 7.  Aortic Atherosclerosis (ICD10-I70.0). Electronically Signed   By: Elgie Collard M.D.   On: 06/04/2023 16:23   CT Head Wo Contrast  Result Date: 06/04/2023 CLINICAL DATA:  Mental status change, unknown cause. Generalized weakness and fatigue. EXAM: CT HEAD WITHOUT CONTRAST TECHNIQUE: Contiguous axial images were obtained from the base of the skull through the vertex without intravenous contrast. RADIATION DOSE REDUCTION: This exam was performed according to the departmental dose-optimization program which includes automated exposure control, adjustment of the mA and/or kV according to patient size and/or use of iterative reconstruction technique.  COMPARISON:  Head CT 11/16/2022. FINDINGS: Brain: No acute hemorrhage. 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 Cortical gray-white differentiation is otherwise preserved. Prominence of the ventricles and sulci within expected range for age. No hydrocephalus or extra-axial collection. No mass effect or midline shift. Vascular: No hyperdense vessel or unexpected calcification. Skull: No calvarial fracture or suspicious bone lesion. Skull base is unremarkable. Sinuses/Orbits: No acute finding. Other: None. IMPRESSION: 1. No acute intracranial abnormality. 2. 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 Electronically Signed   By: Orvan Falconer M.D.   On: 06/04/2023 15:13   DG Chest Port 1 View  Result Date: 06/04/2023 CLINICAL DATA:  Questionable sepsis.  Evaluate for abnormality. EXAM: PORTABLE CHEST 1 VIEW COMPARISON:  Chest radiograph 11/16/2022 CT abdomen 01/25/2023 FINDINGS: There appears to be a large hiatal hernia. Hazy densities in the right lower lung may represent atelectasis but indeterminate. Coarse lung markings appear to be chronic. Heart size is grossly stable. IMPRESSION: 1. Hazy densities in the right lower lung. Findings could represent atelectasis but indeterminate. 2. Large hiatal hernia. Electronically Signed   By: Richarda Overlie M.D.   On: 06/04/2023 13:21    Procedures Procedures    Medications Ordered in ED Medications  sodium bicarbonate tablet 650 mg (650 mg Oral Given 06/04/23 1614)  doxycycline (VIBRA-TABS) tablet 100 mg (has no administration in time range)  lactated ringers bolus 500 mL (0 mLs Intravenous Stopped 06/04/23 1249)    ED Course/ Medical Decision Making/ A&P                                 Medical Decision Making Amount and/or Complexity of Data Reviewed Labs: ordered. Radiology: ordered. ECG/medicine tests: ordered.  Risk OTC  drugs. Prescription drug management.   This patient presents to the ED for concern of generalized weakness, this involves an extensive number of treatment options, and is a complaint that carries with it a high risk of complications and morbidity.  The differential diagnosis includes deconditioning, progression of cancer, dehydration, metabolic derangements, infection   Co morbidities that complicate the patient evaluation  prostate cancer, anemia, CKD, HTN, nephrolithiasis, malnutrition, CAD, CHF, atrial  fibrillation, dementia   Additional history obtained:  Additional history obtained from EMS, patient's daughter-in-law External records from outside source obtained and reviewed including EMR   Lab Tests:  I Ordered, and personally interpreted labs.  The pertinent results include: Worsening kidney function, azotemia, non-anion gap metabolic acidosis, baseline anemia, slight leukopenia, no evidence of UTI, normal troponin and BNP   Imaging Studies ordered:  I ordered imaging studies including chest x-ray, CT of head, chest, abdomen, pelvis I independently visualized and interpreted imaging which showed mild cardiomegaly, trace bilateral pleural effusions, interstitial edema and/or atypical pneumonia, age-indeterminate rib and compression fractures, redemonstration of chronic severe left hydronephrosis I agree with the radiologist interpretation   Cardiac Monitoring: / EKG:  The patient was maintained on a cardiac monitor.  I personally viewed and interpreted the cardiac monitored which showed an underlying rhythm of: Sinus rhythm   Consultations Obtained:  I requested consultation with the nephrologist, Dr. Malen Gauze,  and discussed lab and imaging findings as well as pertinent plan - they recommend: Initiate bicarb tablets.  Nephrology team will see in the morning.   Problem List / ED Course / Critical interventions / Medication management  Patient presenting for fatigue and  generalized weakness, worsening over the past 2 weeks.  On arrival, patient is overall well-appearing.  He denies any current physical complaints.  His breathing is currently unlabored.  Lungs are clear to auscultation.  His abdomen is soft.  He does endorse a mild tenderness to deep palpation.  He is moving all extremities.  History from patient is limited due to his dementia.  Broad workup was initiated.  Lab work was notable for decreased kidney function, worsened azotemia, and worsened non-anion gap metabolic acidosis.  I spoke with nephrologist on-call, Dr. Malen Gauze, who recommended initiation of bicarb tablets.  These were ordered.  Patient's remaining lab workup was unremarkable.  CT imaging did show evidence of pulmonary edema and/or atypical pneumonia.  Patient to be started on doxycycline.  Will avoid giving any further IV fluids.  I had a shared decision-making discussion with patient's daughter-in-law.  Patient's daughter-in-law would prefer admission to the hospital tonight for continued observation and possibly echocardiogram.  Patient's family may choose to bring him home tomorrow for the holiday.  Daughter-in-law is aware of his poor prognosis.  Patient was admitted for further management. I ordered medication including IV fluids for hydration; sodium bicarbonate for acidosis; doxycycline for empiric treatment of atypical pneumonia Reevaluation of the patient after these medicines showed that the patient improved I have reviewed the patients home medicines and have made adjustments as needed   Social Determinants of Health:  Lives at home with grandson, currently seen by palliative care         Final Clinical Impression(s) / ED Diagnoses Final diagnoses:  Generalized weakness  AKI (acute kidney injury) (HCC)  Metabolic acidosis    Rx / DC Orders ED Discharge Orders     None         Gloris Manchester, MD 06/04/23 1722

## 2023-06-04 NOTE — Progress Notes (Signed)
New Admission Note:  87[facility]-21   Arrival Method: ED stretcher Mental Orientation: a/ox4 Telemetry: box 21 Assessment:completed Skin: intact IV: left ac Pain:n/a Tubes: n/a Safety Measures: nonskid socks bed alarm Admission: completed Orientation: Patient has been oriented to the room, unit and staff.  Family: step daughter Belongings: at bedside   Orders have been reviewed and implemented. Will continue to monitor the patient. Call light has been placed within reach and bed alarm has been activated.   Fabian Sharp BSN, RN-BC Phone number: [phone]

## 2023-06-04 NOTE — ED Notes (Signed)
ED TO INPATIENT HANDOFF REPORT  ED Nurse Name and Phone #: [phone]  S Name/Age/Gender [name] 89 y.o. male Room/Bed: 012C/012C  Code Status   Code Status: Limited: Do not attempt resuscitation (DNR) -DNR-LIMITED -Do Not Intubate/DNI   Home/SNF/Other Home Patient oriented to: self and place Is this baseline? Yes   Triage Complete: Triage complete  Chief Complaint Generalized weakness [R53.1]  Triage Note  Pt here from home with c/o gen weakness , had a flu shot last week and  feels tired , no fevers    Allergies Allergies  Allergen Reactions   Ativan [Lorazepam] Other (See Comments)    Hallucinations, combative   Penicillin G Other (See Comments)    Has patient had a PCN reaction causing immediate rash, facial/tongue/throat swelling, SOB or lightheadedness with hypotension: Yes Has patient had a PCN reaction causing severe rash involving mucus membranes or skin necrosis: No Has patient had a PCN reaction that required hospitalization No Has patient had a PCN reaction occurring within the last 10 years: No If all of the above answers are "NO", then may proceed with Cephalosporin use.    Amlodipine Swelling   Lisinopril Other (See Comments)   Spironolactone Other (See Comments)   Tessalon [Benzonatate]     Level of Care/Admitting Diagnosis ED Disposition     ED Disposition  Admit   Condition  --   Comment  Hospital Area: [facility] [[id number]]  Level of Care: Telemetry Medical [[id number]]  May place patient in observation at [facility] or [facility] if equivalent level of care is available:: No  Covid Evaluation: Asymptomatic - no recent exposure (last 10 days) testing not required  Diagnosis: Generalized weakness [329518]  Admitting Physician: Joycelyn Das [[id number]]  Attending Physician: Joycelyn Das [[id number]]          B Medical/Surgery History Past Medical History:  Diagnosis Date   Anemia    Cardiac disease    CHF  (congestive heart failure) (HCC)    Choledocholithiasis    Chronic kidney disease (CKD), stage IV (severe) (HCC)    left   Elevated LFTs    Esophageal stenosis    Hiatal hernia 04/03/2019   large   History of blindness    History of diarrhea    History of gallstones    Hypercholesteremia    Hypertension    Hypothyroidism    Malnutrition (HCC)    Osteoporosis    Prostate cancer (HCC)    Sepsis (HCC)    Shingles    Shingles    Skin cancer    right cheek   Past Surgical History:  Procedure Laterality Date   CARDIAC CATHETERIZATION  04/13/2014   CT PERC CHOLECYSTOSTOMY  07/20/2016   Placed at [facility]   ENDOSCOPIC RETROGRADE CHOLANGIOPANCREATOGRAPHY (ERCP) WITH PROPOFOL N/A 04/06/2019   Procedure: ENDOSCOPIC RETROGRADE CHOLANGIOPANCREATOGRAPHY (ERCP) WITH PROPOFOL;  Surgeon: Midge Minium, MD;  Location: [facility];  Service: Endoscopy;  Laterality: N/A;     A IV Location/Drains/Wounds Patient Lines/Drains/Airways Status     Active Line/Drains/Airways     Name Placement date Placement time Site Days   Peripheral IV 06/04/23 20 G Left Antecubital 06/04/23  1141  Antecubital  less than 1            Intake/Output Last 24 hours No intake or output data in the 24 hours ending 06/04/23 1850  Labs/Imaging Results for orders placed or performed during the hospital encounter of 06/04/23 (from the past 48 hour(s))  Urinalysis, w/ Reflex to Culture (Infection Suspected) -Urine, Clean Catch     Status: Abnormal   Collection Time: 06/04/23 11:17 AM  Result Value Ref Range   Specimen Source URINE, CLEAN CATCH    Color, Urine YELLOW YELLOW   APPearance CLEAR CLEAR   Specific Gravity, Urine 1.012 1.005 - 1.030   pH 5.0 5.0 - 8.0   Glucose, UA NEGATIVE NEGATIVE mg/dL   Hgb urine dipstick SMALL (A) NEGATIVE   Bilirubin Urine NEGATIVE NEGATIVE   Ketones, ur NEGATIVE NEGATIVE mg/dL   Protein, ur NEGATIVE NEGATIVE mg/dL   Nitrite NEGATIVE NEGATIVE    Leukocytes,Ua NEGATIVE NEGATIVE   RBC / HPF 0-5 0 - 5 RBC/hpf   WBC, UA 0-5 0 - 5 WBC/hpf    Comment:        Reflex urine culture not performed if WBC <=10, OR if Squamous epithelial cells >5. If Squamous epithelial cells >5 suggest recollection.    Bacteria, UA NONE SEEN NONE SEEN   Squamous Epithelial / HPF 0-5 0 - 5 /HPF   Hyaline Casts, UA PRESENT     Comment: Performed at [facility], [address] [place], [place] [address]  Resp panel by RT-PCR (RSV, Flu A&B, Covid) Anterior Nasal Swab     Status: None   Collection Time: 06/04/23 11:17 AM   Specimen: Anterior Nasal Swab  Result Value Ref Range   SARS Coronavirus 2 by RT PCR NEGATIVE NEGATIVE   Influenza A by PCR NEGATIVE NEGATIVE   Influenza B by PCR NEGATIVE NEGATIVE    Comment: (NOTE) The Xpert Xpress SARS-CoV-2/FLU/RSV plus assay is intended as an aid in the diagnosis of influenza from Nasopharyngeal swab specimens and should not be used as a sole basis for treatment. Nasal washings and aspirates are unacceptable for Xpert Xpress SARS-CoV-2/FLU/RSV testing.  Fact Sheet for Patients: BloggerCourse.com  Fact Sheet for Healthcare Providers: SeriousBroker.it  This test is not yet approved or cleared by the [place] FDA and has been authorized for detection and/or diagnosis of SARS-CoV-2 by FDA under an Emergency Use Authorization (EUA). This EUA will remain in effect (meaning this test can be used) for the duration of the COVID-19 declaration under Section 564(b)(1) of the Act, 21 U.S.C. section 360bbb-3(b)(1), unless the authorization is terminated or revoked.     Resp Syncytial Virus by PCR NEGATIVE NEGATIVE    Comment: (NOTE) Fact Sheet for Patients: BloggerCourse.com  Fact Sheet for Healthcare Providers: SeriousBroker.it  This test is not yet approved or cleared by the [place] FDA and has  been authorized for detection and/or diagnosis of SARS-CoV-2 by FDA under an Emergency Use Authorization (EUA). This EUA will remain in effect (meaning this test can be used) for the duration of the COVID-19 declaration under Section 564(b)(1) of the Act, 21 U.S.C. section 360bbb-3(b)(1), unless the authorization is terminated or revoked.  Performed at [facility], [address] [place], [place] [address]   Comprehensive metabolic panel     Status: Abnormal   Collection Time: 06/04/23 11:28 AM  Result Value Ref Range   Sodium 133 (L) 135 - 145 mmol/L   Potassium 3.5 3.5 - 5.1 mmol/L   Chloride 110 98 - 111 mmol/L   CO2 13 (L) 22 - 32 mmol/L   Glucose, Bld 110 (H) 70 - 99 mg/dL    Comment: Glucose reference range applies only to samples taken after fasting for at least 8 hours.   BUN 65 (H) 8 - 23 mg/dL  Creatinine, Ser 4.21 (H) 0.61 - 1.24 mg/dL   Calcium 8.8 (L) 8.9 - 10.3 mg/dL   Total Protein 5.7 (L) 6.5 - 8.1 g/dL   Albumin 2.6 (L) 3.5 - 5.0 g/dL   AST 30 15 - 41 U/L   ALT 47 (H) 0 - 44 U/L   Alkaline Phosphatase 60 38 - 126 U/L   Total Bilirubin 0.8 <1.2 mg/dL   GFR, Estimated 12 (L) >60 mL/min    Comment: (NOTE) Calculated using the CKD-EPI Creatinine Equation (2021)    Anion gap 10 5 - 15    Comment: Performed at [facility], [address] [place], [place] [address]  CBC with Differential     Status: Abnormal   Collection Time: 06/04/23 11:28 AM  Result Value Ref Range   WBC 3.8 (L) 4.0 - 10.5 K/uL   RBC 3.04 (L) 4.22 - 5.81 MIL/uL   Hemoglobin 9.1 (L) 13.0 - 17.0 g/dL   HCT 64.3 (L) 32.9 - 51.8 %   MCV 93.8 80.0 - 100.0 fL   MCH 29.9 26.0 - 34.0 pg   MCHC 31.9 30.0 - 36.0 g/dL   RDW 84.1 (H) 66.0 - 63.0 %   Platelets 112 (L) 150 - 400 K/uL    Comment: Immature Platelet Fraction may be clinically indicated, consider ordering this additional test [id number] REPEATED TO VERIFY    nRBC 0.0 0.0 - 0.2 %   Neutrophils Relative % 77 %   Neutro  Abs 2.9 1.7 - 7.7 K/uL   Lymphocytes Relative 16 %   Lymphs Abs 0.6 (L) 0.7 - 4.0 K/uL   Monocytes Relative 5 %   Monocytes Absolute 0.2 0.1 - 1.0 K/uL   Eosinophils Relative 1 %   Eosinophils Absolute 0.0 0.0 - 0.5 K/uL   Basophils Relative 0 %   Basophils Absolute 0.0 0.0 - 0.1 K/uL   Immature Granulocytes 1 %   Abs Immature Granulocytes 0.05 0.00 - 0.07 K/uL    Comment: Performed at [facility], [address], [place], [place] [address]  Protime-INR     Status: Abnormal   Collection Time: 06/04/23 11:28 AM  Result Value Ref Range   Prothrombin Time 16.8 (H) 11.4 - 15.2 seconds   INR 1.3 (H) 0.8 - 1.2    Comment: (NOTE) INR goal varies based on device and disease states. Performed at [facility], [address], [place], [place] [address]   Troponin I (High Sensitivity)     Status: None   Collection Time: 06/04/23 11:28 AM  Result Value Ref Range   Troponin I (High Sensitivity) 16 <18 ng/L    Comment: (NOTE) Elevated high sensitivity troponin I (hsTnI) values and significant  changes across serial measurements may suggest ACS but many other  chronic and acute conditions are known to elevate hsTnI results.  Refer to the "Links" section for chest pain algorithms and additional  guidance. Performed at [facility], [address] [place], [place] [address]   Magnesium     Status: None   Collection Time: 06/04/23 11:28 AM  Result Value Ref Range   Magnesium 1.7 1.7 - 2.4 mg/dL    Comment: Performed at [facility], [address] [place], [place] [address]  TSH     Status: None   Collection Time: 06/04/23 11:28 AM  Result Value Ref Range   TSH 2.034 0.350 - 4.500 uIU/mL    Comment: Performed by a 3rd Generation assay  with a functional sensitivity of <=0.01 uIU/mL. Performed at [facility], [address] [place], [place] [address]   Brain natriuretic peptide     Status: None   Collection Time: 06/04/23 11:28 AM  Result Value Ref  Range   B Natriuretic Peptide 39.5 0.0 - 100.0 pg/mL    Comment: Performed at [facility], [address], [place], [place] [address]  I-Stat Lactic Acid, ED     Status: None   Collection Time: 06/04/23 12:02 PM  Result Value Ref Range   Lactic Acid, Venous 1.2 0.5 - 1.9 mmol/L  I-Stat Chem 8, ED     Status: Abnormal   Collection Time: 06/04/23 12:05 PM  Result Value Ref Range   Sodium 135 135 - 145 mmol/L   Potassium 3.5 3.5 - 5.1 mmol/L   Chloride 111 98 - 111 mmol/L   BUN 65 (H) 8 - 23 mg/dL   Creatinine, Ser 9.14 (H) 0.61 - 1.24 mg/dL   Glucose, Bld 782 (H) 70 - 99 mg/dL    Comment: Glucose reference range applies only to samples taken after fasting for at least 8 hours.   Calcium, Ion 1.26 1.15 - 1.40 mmol/L   TCO2 14 (L) 22 - 32 mmol/L   Hemoglobin 9.5 (L) 13.0 - 17.0 g/dL   HCT 95.6 (L) 21.3 - 08.6 %  Troponin I (High Sensitivity)     Status: None   Collection Time: 06/04/23  1:17 PM  Result Value Ref Range   Troponin I (High Sensitivity) 15 <18 ng/L    Comment: (NOTE) Elevated high sensitivity troponin I (hsTnI) values and significant  changes across serial measurements may suggest ACS but many other  chronic and acute conditions are known to elevate hsTnI results.  Refer to the "Links" section for chest pain algorithms and additional  guidance. Performed at [facility], [address], [place], [place] [address]   I-Stat Lactic Acid, ED     Status: None   Collection Time: 06/04/23  5:58 PM  Result Value Ref Range   Lactic Acid, Venous 0.6 0.5 - 1.9 mmol/L   CT CHEST ABDOMEN PELVIS WO CONTRAST  Result Date: 06/04/2023 CLINICAL DATA:  Unintentional weight loss. Generalized weakness. Recent flu shot and patient feels tired. EXAM: CT CHEST, ABDOMEN AND PELVIS WITHOUT CONTRAST TECHNIQUE: Multidetector CT imaging of the chest, abdomen and pelvis was performed following the standard protocol without IV contrast. RADIATION DOSE REDUCTION: This exam was  performed according to the departmental dose-optimization program which includes automated exposure control, adjustment of the mA and/or kV according to patient size and/or use of iterative reconstruction technique. COMPARISON:  CT abdomen pelvis dated 01/25/2023. FINDINGS: Evaluation of this exam is limited in the absence of intravenous contrast. CT CHEST FINDINGS Cardiovascular: Mild cardiomegaly. No pericardial effusion. Three-vessel coronary vascular calcification. There is moderate atherosclerotic calcification of the thoracic aorta. No aneurysmal dilatation. The central pulmonary arteries are grossly unremarkable on this noncontrast CT. Mediastinum/Nodes: No hilar or mediastinal adenopathy. There is a large hiatal hernia containing the majority of the stomach. The esophagus is grossly unremarkable. No mediastinal fluid collection. Lungs/Pleura: Trace bilateral pleural effusions, decreased since the prior CT. There is diffuse interstitial and interlobular septal prominence suggestive of edema. Atypical pneumonia is not excluded. Clinical correlation is recommended. Small left upper lobe calcified granuloma. No pneumothorax. The central airways are patent. Musculoskeletal: Osteopenia with degenerative changes of the spine. Old healed left rib fractures. Age indeterminate compression fracture of superior  endplate of T1 with approximately 40% loss of vertebral body height anteriorly. Correlation with clinical exam and point tenderness recommended. There is a minimally displaced fracture of the posterior left twelfth rib, new since the prior CT. CT ABDOMEN PELVIS FINDINGS No intra-abdominal free air or free fluid. Hepatobiliary: The liver is unremarkable. No biliary dilatation. The gallbladder is unremarkable. Pancreas: Unremarkable. No pancreatic ductal dilatation or surrounding inflammatory changes. Spleen: Normal in size without focal abnormality. Adrenals/Urinary Tract: The adrenal glands unremarkable. Similar  appearance of chronic severe left hydronephrosis and moderate left hydroureter. There is severe left renal parenchyma atrophy. An 18 mm nonobstructing stone in the upper pole of the left kidney in similar position. There is no hydronephrosis or nephrolithiasis on the right. The right ureter appears unremarkable. There is trabeculated appearance of the bladder wall likely related to chronic bladder outlet obstruction. Stomach/Bowel: There is sigmoid diverticulosis without active inflammatory changes. There is no bowel obstruction or active inflammation. The appendix is normal. Vascular/Lymphatic: Moderate aortoiliac atherosclerotic disease. The IVC is unremarkable. No portal venous gas. There is no adenopathy. There is mild haziness of the mesentery with multiple top-normal lymph nodes with a "misty mesentery" appearance. This finding is nonspecific but may be related to underlying inflammatory/infectious etiology. Reproductive: The prostate and seminal vesicles are grossly unremarkable. No pelvic mass. Other: None Musculoskeletal: Osteopenia with degenerative changes of the spine. Severe arthritic changes of the left hip with bone-on-bone contact. Healing fracture of inferior right pubic ramus. No acute osseous pathology. IMPRESSION: 1. Mild cardiomegaly with trace bilateral pleural effusions and diffuse interstitial and interlobular septal prominence suggestive of edema. Atypical pneumonia is not excluded. 2. Age indeterminate compression fracture of superior endplate of T1. Correlation with clinical exam and point tenderness recommended. 3. Minimally displaced fracture of the posterior left twelfth rib, new since the prior CT. 4. Sigmoid diverticulosis. No bowel obstruction. Normal appendix. 5. Similar appearance of chronic severe left hydronephrosis and moderate left hydroureter with severe left renal parenchyma atrophy. 6. An 18 mm nonobstructing stone in the upper pole of the left kidney. 7.  Aortic  Atherosclerosis (ICD10-I70.0). Electronically Signed   By: Elgie Collard M.D.   On: 06/04/2023 16:23   CT Head Wo Contrast  Result Date: 06/04/2023 CLINICAL DATA:  Mental status change, unknown cause. Generalized weakness and fatigue. EXAM: CT HEAD WITHOUT CONTRAST TECHNIQUE: Contiguous axial images were obtained from the base of the skull through the vertex without intravenous contrast. RADIATION DOSE REDUCTION: This exam was performed according to the departmental dose-optimization program which includes automated exposure control, adjustment of the mA and/or kV according to patient size and/or use of iterative reconstruction technique. COMPARISON:  Head CT 11/16/2022. FINDINGS: Brain: No acute hemorrhage. 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 Cortical gray-white differentiation is otherwise preserved. Prominence of the ventricles and sulci within expected range for age. No hydrocephalus or extra-axial collection. No mass effect or midline shift. Vascular: No hyperdense vessel or unexpected calcification. Skull: No calvarial fracture or suspicious bone lesion. Skull base is unremarkable. Sinuses/Orbits: No acute finding. Other: None. IMPRESSION: 1. No acute intracranial abnormality. 2. 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 Electronically Signed   By: Orvan Falconer M.D.   On: 06/04/2023 15:13   DG Chest Port 1 View  Result Date: 06/04/2023 CLINICAL DATA:  Questionable sepsis.  Evaluate for abnormality. EXAM: PORTABLE CHEST 1 VIEW COMPARISON:  Chest radiograph 11/16/2022 CT abdomen 01/25/2023 FINDINGS: There appears to  be a large hiatal hernia. Hazy densities in the right lower lung may represent atelectasis but indeterminate. Coarse lung markings appear to be chronic. Heart size is grossly stable. IMPRESSION: 1. Hazy densities in the right lower lung. Findings could  represent atelectasis but indeterminate. 2. Large hiatal hernia. Electronically Signed   By: Richarda Overlie M.D.   On: 06/04/2023 13:21    Pending Labs Unresulted Labs (From admission, onward)     Start     Ordered   06/05/23 0500  Comprehensive metabolic panel  Tomorrow morning,   R        06/04/23 1750   06/05/23 0500  CBC  Tomorrow morning,   R        06/04/23 1750   06/05/23 0500  Magnesium  Tomorrow morning,   R        06/04/23 1750            Vitals/Pain Today's Vitals   06/04/23 1147 06/04/23 1149 06/04/23 1529 06/04/23 1715  BP:    (!) 107/44  Pulse:  95  (!) 58  Resp:    15  Temp:   (!) 97.5 F (36.4 C)   TempSrc:   Oral   SpO2:  99%  97%  PainSc: 0-No pain       Isolation Precautions No active isolations  Medications Medications  sodium bicarbonate tablet 650 mg (650 mg Oral Given 06/04/23 1614)  doxycycline (VIBRA-TABS) tablet 100 mg (has no administration in time range)  amiodarone (PACERONE) tablet 200 mg (has no administration in time range)  nitroGLYCERIN (NITROSTAT) SL tablet 0.4 mg (has no administration in time range)  levothyroxine (SYNTHROID) tablet 100 mcg (has no administration in time range)  saccharomyces boulardii (FLORASTOR) capsule 250 mg (has no administration in time range)  pantoprazole (PROTONIX) EC tablet 40 mg (has no administration in time range)  mirabegron ER (MYRBETRIQ) tablet 50 mg (has no administration in time range)  tamsulosin (FLOMAX) capsule 0.4 mg (has no administration in time range)  apixaban (ELIQUIS) tablet 2.5 mg (has no administration in time range)  Vitamin D3 TABS 5,000 Units (has no administration in time range)  potassium chloride SA (KLOR-CON M) CR tablet 10 mEq (has no administration in time range)  sodium chloride flush (NS) 0.9 % injection 3 mL (has no administration in time range)  sodium chloride flush (NS) 0.9 % injection 3 mL (has no administration in time range)  sodium chloride flush (NS) 0.9 % injection 3  mL (has no administration in time range)  0.9 %  sodium chloride infusion (has no administration in time range)  acetaminophen (TYLENOL) tablet 650 mg (has no administration in time range)    Or  acetaminophen (TYLENOL) suppository 650 mg (has no administration in time range)  ondansetron (ZOFRAN) tablet 4 mg (has no administration in time range)    Or  ondansetron (ZOFRAN) injection 4 mg (has no administration in time range)  albuterol (PROVENTIL) (2.5 MG/3ML) 0.083% nebulizer solution 3 mL (has no administration in time range)  lactated ringers bolus 500 mL (0 mLs Intravenous Stopped 06/04/23 1249)    Mobility walks with device     Focused Assessments musculoskeletal   R Recommendations: See Admitting Provider Note  Report given to:   Additional Notes: patient is weak and need assistant. On doxy for atypical pneumonia

## 2023-06-04 NOTE — Progress Notes (Signed)
[facility] liaison note  This patient is currently followed by our community based outpatient palliative team. He was scheduled to be seen tomorrow in the home for hospice assessment visit.   Liaisons will follow to assist with discharge planning.   Please don't hesitate to reach out for any hospice/palliative related questions or concerns.   Thank you Thea Gist, BSN, [facility] liaison [phone]

## 2023-06-04 NOTE — ED Triage Notes (Signed)
Pt here from home with c/o gen weakness , had a flu shot last week and  feels tired , no fevers

## 2023-06-05 ENCOUNTER — Encounter: Payer: Self-pay | Admitting: Radiation Oncology

## 2023-06-05 ENCOUNTER — Other Ambulatory Visit (HOSPITAL_COMMUNITY): Payer: Self-pay

## 2023-06-05 ENCOUNTER — Observation Stay (HOSPITAL_COMMUNITY): Payer: Medicare Other

## 2023-06-05 DIAGNOSIS — R531 Weakness: Secondary | ICD-10-CM | POA: Diagnosis not present

## 2023-06-05 LAB — CBC
HCT: 27.1 % — ABNORMAL LOW (ref 39.0–52.0)
Hemoglobin: 8.9 g/dL — ABNORMAL LOW (ref 13.0–17.0)
MCH: 30.3 pg (ref 26.0–34.0)
MCHC: 32.8 g/dL (ref 30.0–36.0)
MCV: 92.2 fL (ref 80.0–100.0)
Platelets: 100 10*3/uL — ABNORMAL LOW (ref 150–400)
RBC: 2.94 MIL/uL — ABNORMAL LOW (ref 4.22–5.81)
RDW: 17.1 % — ABNORMAL HIGH (ref 11.5–15.5)
WBC: 2.8 10*3/uL — ABNORMAL LOW (ref 4.0–10.5)
nRBC: 0 % (ref 0.0–0.2)

## 2023-06-05 LAB — COMPREHENSIVE METABOLIC PANEL
ALT: 41 U/L (ref 0–44)
AST: 31 U/L (ref 15–41)
Albumin: 2.3 g/dL — ABNORMAL LOW (ref 3.5–5.0)
Alkaline Phosphatase: 55 U/L (ref 38–126)
Anion gap: 8 (ref 5–15)
BUN: 57 mg/dL — ABNORMAL HIGH (ref 8–23)
CO2: 14 mmol/L — ABNORMAL LOW (ref 22–32)
Calcium: 8.6 mg/dL — ABNORMAL LOW (ref 8.9–10.3)
Chloride: 113 mmol/L — ABNORMAL HIGH (ref 98–111)
Creatinine, Ser: 3.85 mg/dL — ABNORMAL HIGH (ref 0.61–1.24)
GFR, Estimated: 14 mL/min — ABNORMAL LOW (ref >60)
Glucose, Bld: 92 mg/dL (ref 70–99)
Potassium: 3.8 mmol/L (ref 3.5–5.1)
Sodium: 135 mmol/L (ref 135–145)
Total Bilirubin: 0.7 mg/dL (ref <1.2)
Total Protein: 5 g/dL — ABNORMAL LOW (ref 6.5–8.1)

## 2023-06-05 LAB — MAGNESIUM: Magnesium: 1.8 mg/dL (ref 1.7–2.4)

## 2023-06-05 MED ORDER — DOXYCYCLINE HYCLATE 100 MG PO TABS
100.0000 mg | ORAL_TABLET | Freq: Two times a day (BID) | ORAL | 0 refills | Status: AC
  Filled 2023-06-05: qty 10, 5d supply, fill #0

## 2023-06-05 MED ORDER — TORSEMIDE 20 MG PO TABS: 20.0000 mg | ORAL_TABLET | Freq: Every day | ORAL | Status: DC | PRN

## 2023-06-05 NOTE — Evaluation (Signed)
Physical Therapy Evaluation Patient Details Name: [name] MRN: [id number] DOB: 03-25-1927 Today's Date: 06/05/2023  History of Present Illness  Pt is 89 yo male who presented to [facility] on 06/04/23 with progressive generalized weakness for the last 2 weeks with increasing fatigue since he got his flu shot. Pt with AKI on CKD stage 4 and FTT.  PMH of prostate cancer, anemia, stage IV CKD, hypertension, protein calorie malnutrition, failure to thrive, CAD, CHF, atrial fibrillation and dementia .  Pt was on home palliative services with meeting set up with hospice.  Clinical Impression  Pt admitted with above diagnosis. Pt with hx of dementia and has 24 hr caregivers at home.  He was on home palliative services with meeting set up with hospice (per MD , now family wants return home with hospice).  Pt confused but agreeable with therapy.  He required min A for all transfers and to ambulate 18' with RW.  Increased time and cues for all.  No family present; however, suspect near baseline.  Will benefit from acute PT to advance and maintain while hospitalized to reduce caregiver burden. Pt currently with functional limitations due to the deficits listed below (see PT Problem List). Pt will benefit from acute skilled PT to increase their independence and safety with mobility to allow discharge.  Noting plan to return home with hospice, if not then could benefit from HHPT.          If plan is discharge home, recommend the following: A little help with walking and/or transfers;A little help with bathing/dressing/bathroom;Assistance with cooking/housework;Help with stairs or ramp for entrance   Can travel by private vehicle        Equipment Recommendations Other (comment) (Transport chair if they don't already have)  Recommendations for Other Services       Functional Status Assessment Patient has had a recent decline in their functional status and demonstrates the ability to make significant  improvements in function in a reasonable and predictable amount of time.     Precautions / Restrictions Precautions Precautions: Fall      Mobility  Bed Mobility Overal bed mobility: Needs Assistance Bed Mobility: Supine to Sit, Sit to Supine     Supine to sit: Min assist, HOB elevated, Used rails Sit to supine: Min assist, HOB elevated, Used rails   General bed mobility comments: increased time and cues    Transfers Overall transfer level: Needs assistance Equipment used: Rolling walker (2 wheels) Transfers: Sit to/from Stand Sit to Stand: Min assist, Mod assist           General transfer comment: Performed x 2; varied min-mod A; cues to wait for therapist    Ambulation/Gait Ambulation/Gait assistance: Min assist Gait Distance (Feet): 40 Feet Assistive device: Rolling walker (2 wheels) Gait Pattern/deviations: Step-through pattern, Decreased stride length, Shuffle Gait velocity: decreased     General Gait Details: posterior lean requiring min A for balance and for RW management  Stairs            Wheelchair Mobility     Tilt Bed    Modified Rankin (Stroke Patients Only)       Balance Overall balance assessment: Needs assistance Sitting-balance support: No upper extremity supported Sitting balance-Leahy Scale: Good     Standing balance support: Bilateral upper extremity supported Standing balance-Leahy Scale: Poor Standing balance comment: RW and min A  Pertinent Vitals/Pain Pain Assessment Pain Assessment: No/denies pain    Home Living Family/patient expects to be discharged to:: Private residence Living Arrangements: Spouse/significant other;Children Available Help at Discharge: Family;Available 24 hours/day;Personal care attendant Type of Home: House Home Access: Ramped entrance       Home Layout: One level Home Equipment: Agricultural consultant (2 wheels) Additional Comments: Pt unreliable  historian.  Per chart and MD pt from home with family and has 24 hr caregivers.  Noting that family wanting to take pt home with ongoing palliative/hospice services.    Prior Function Prior Level of Function : Needs assist             Mobility Comments: Pt ambulated some with RW and caregiver ADLs Comments: Chart states pt has assist with ADLs     Extremity/Trunk Assessment   Upper Extremity Assessment Upper Extremity Assessment: Defer to OT evaluation    Lower Extremity Assessment Lower Extremity Assessment: Generalized weakness;Difficult to assess due to impaired cognition    Cervical / Trunk Assessment Cervical / Trunk Assessment: Kyphotic  Communication      Cognition Arousal: Alert Behavior During Therapy:  (irritated but agreeable to therapy) Overall Cognitive Status: No family/caregiver present to determine baseline cognitive functioning                                 General Comments: Hx of dementia; likely baseline; follows commands with increased time/cues; pt fixated on dentures        General Comments      Exercises     Assessment/Plan    PT Assessment Patient needs continued PT services  PT Problem List Decreased strength;Decreased range of motion;Decreased activity tolerance;Decreased balance;Decreased mobility;Decreased knowledge of use of DME;Decreased cognition;Decreased safety awareness;Decreased knowledge of precautions       PT Treatment Interventions DME instruction;Therapeutic exercise;Gait training;Balance training;Functional mobility training;Therapeutic activities;Patient/family education;Cognitive remediation    PT Goals (Current goals can be found in the Care Plan section)  Acute Rehab PT Goals Patient Stated Goal: pt unable to state; per MD note -returning home with hospice PT Goal Formulation: With patient Time For Goal Achievement: 06/18/23 Potential to Achieve Goals: Fair    Frequency Min 1X/week      Co-evaluation PT/OT/SLP Co-Evaluation/Treatment: Yes Reason for Co-Treatment: Complexity of the patient's impairments (multi-system involvement);For patient/therapist safety           AM-PAC PT "6 Clicks" Mobility  Outcome Measure Help needed turning from your back to your side while in a flat bed without using bedrails?: A Little Help needed moving from lying on your back to sitting on the side of a flat bed without using bedrails?: A Little Help needed moving to and from a bed to a chair (including a wheelchair)?: A Little Help needed standing up from a chair using your arms (e.g., wheelchair or bedside chair)?: A Little Help needed to walk in hospital room?: A Little Help needed climbing 3-5 steps with a railing? : Total 6 Click Score: 16    End of Session Equipment Utilized During Treatment: Gait belt Activity Tolerance: Patient tolerated treatment well Patient left: in bed;with call bell/phone within reach;with bed alarm set Nurse Communication: Mobility status PT Visit Diagnosis: Other abnormalities of gait and mobility (R26.89);Muscle weakness (generalized) (M62.81)    Time: 0272-5366 PT Time Calculation (min) (ACUTE ONLY): 18 min   Charges:   PT Evaluation $PT Eval Low Complexity: 1 Low   PT General Charges $$  ACUTE PT VISIT: 1 Visit         Anise Salvo, PT Acute Rehab Services [facility] [phone]   Rayetta Humphrey 06/05/2023, 12:49 PM

## 2023-06-05 NOTE — Care Management Obs Status (Signed)
MEDICARE OBSERVATION STATUS NOTIFICATION   Patient Details  Name: [name] MRN: [id number] Date of Birth: 14-Feb-1927   Medicare Observation Status Notification Given:  Yes    Tom-Johnson, Hershal Coria, RN 06/05/2023, 1:49 PM

## 2023-06-05 NOTE — Evaluation (Signed)
Occupational Therapy Evaluation Patient Details Name: [name] MRN: [id number] DOB: [birth year] Today's Date: 06/05/2023   History of Present Illness Pt is 89 yo male who presented to [facility] on 06/04/23 with progressive generalized weakness for the last 2 weeks with increasing fatigue since he got his flu shot. Pt with AKI on CKD stage 4 and FTT.  PMH of prostate cancer, anemia, stage IV CKD, hypertension, protein calorie malnutrition, failure to thrive, CAD, CHF, atrial fibrillation and dementia .  Pt was on home palliative services with meeting set up with hospice.   Clinical Impression   Pt admitted for above, at baseline he has dementia and lives with 24/7 caregivers. Pt displaying impaired initiation skills, needing consistent cueing for redirection and fiddles with his teeth a lot.  Pt irritable but agreeable to perform therapy tasks, he needs max to Min A for ADLs and ambulates in hall with min A. Pt would benefit from continued acute skilled OT services while in acute stay to help transition to next level of care. No follow-up OT needed, pt to DC with home hospice.       If plan is discharge home, recommend the following: A little help with walking and/or transfers;A lot of help with bathing/dressing/bathroom;Assistance with cooking/housework;Assistance with feeding;Direct supervision/assist for medications management;Direct supervision/assist for financial management;Supervision due to cognitive status    Functional Status Assessment  Patient has had a recent decline in their functional status and demonstrates the ability to make significant improvements in function in a reasonable and predictable amount of time.  Equipment Recommendations  None recommended by OT    Recommendations for Other Services       Precautions / Restrictions Precautions Precautions: Fall Restrictions Weight Bearing Restrictions: No      Mobility Bed Mobility Overal bed mobility: Needs  Assistance Bed Mobility: Supine to Sit, Sit to Supine     Supine to sit: Min assist, HOB elevated, Used rails Sit to supine: Min assist, HOB elevated, Used rails   General bed mobility comments: increased time and cues    Transfers Overall transfer level: Needs assistance Equipment used: Rolling walker (2 wheels) Transfers: Sit to/from Stand Sit to Stand: Min assist, Mod assist           General transfer comment: Performed x 2; varied min-mod A; cues to wait for therapist      Balance Overall balance assessment: Needs assistance Sitting-balance support: No upper extremity supported Sitting balance-Leahy Scale: Good     Standing balance support: Bilateral upper extremity supported Standing balance-Leahy Scale: Poor Standing balance comment: RW and min A                           ADL either performed or assessed with clinical judgement   ADL Overall ADL's : Needs assistance/impaired Eating/Feeding: Maximal assistance Eating/Feeding Details (indicate cue type and reason): due to decreased iniation and fixation on teeth, hard to fully assess Grooming: Bed level;Minimal assistance;Wash/dry face Grooming Details (indicate cue type and reason): cues for pt to initiate Upper Body Bathing: Sitting;Moderate assistance   Lower Body Bathing: Maximal assistance   Upper Body Dressing : Moderate assistance   Lower Body Dressing: Maximal assistance   Toilet Transfer: Moderate assistance;Rolling walker (2 wheels);Ambulation Toilet Transfer Details (indicate cue type and reason): based on STS from EOB and gait.         Functional mobility during ADLs: Minimal assistance;Rolling walker (2 wheels)       Vision  Perception         Praxis         Pertinent Vitals/Pain Pain Assessment Pain Assessment: No/denies pain     Extremity/Trunk Assessment Upper Extremity Assessment Upper Extremity Assessment: Generalized weakness;Difficult to assess due  to impaired cognition   Lower Extremity Assessment Lower Extremity Assessment: Generalized weakness;Defer to PT evaluation   Cervical / Trunk Assessment Cervical / Trunk Assessment: Kyphotic   Communication     Cognition Arousal: Alert Behavior During Therapy:  (irritated but agreeable to therapy) Overall Cognitive Status: No family/caregiver present to determine baseline cognitive functioning                                 General Comments: Hx of dementia; likely baseline; follows commands with increased time/cues; pt fixated on dentures. decreased initiation     General Comments  Pulls out teeth consistently    Exercises     Shoulder Instructions      Home Living Family/patient expects to be discharged to:: Private residence Living Arrangements: Spouse/significant other;Children Available Help at Discharge: Family;Available 24 hours/day;Personal care attendant Type of Home: House Home Access: Ramped entrance     Home Layout: One level               Home Equipment: Agricultural consultant (2 wheels)   Additional Comments: Pt unreliable historian.  Per chart and MD pt from home with family and has 24 hr caregivers.  Noting that family wanting to take pt home with ongoing palliative/hospice services.      Prior Functioning/Environment Prior Level of Function : Needs assist             Mobility Comments: Pt ambulated some with RW and caregiver ADLs Comments: Chart states pt has assist with ADLs        OT Problem List: Decreased strength;Impaired balance (sitting and/or standing);Decreased cognition      OT Treatment/Interventions: Self-care/ADL training;Balance training;Therapeutic exercise;Therapeutic activities;Patient/family education;DME and/or AE instruction    OT Goals(Current goals can be found in the care plan section) Acute Rehab OT Goals Patient Stated Goal: none stated; believe family wants to go home OT Goal Formulation: Patient  unable to participate in goal setting Time For Goal Achievement: 06/19/23 Potential to Achieve Goals: Good ADL Goals Pt Will Perform Grooming: with supervision;sitting Pt Will Perform Lower Body Dressing: with min assist;sit to/from stand Pt Will Transfer to Toilet: ambulating;with contact guard assist Additional ADL Goal #1: Pt will correctly follow 90% of commands when prompted  OT Frequency: Min 1X/week    Co-evaluation PT/OT/SLP Co-Evaluation/Treatment: Yes Reason for Co-Treatment: Complexity of the patient's impairments (multi-system involvement);For patient/therapist safety PT goals addressed during session: Mobility/safety with mobility;Balance OT goals addressed during session: ADL's and self-care      AM-PAC OT "6 Clicks" Daily Activity     Outcome Measure Help from another person eating meals?: A Lot Help from another person taking care of personal grooming?: A Little Help from another person toileting, which includes using toliet, bedpan, or urinal?: A Lot Help from another person bathing (including washing, rinsing, drying)?: A Lot Help from another person to put on and taking off regular upper body clothing?: A Lot Help from another person to put on and taking off regular lower body clothing?: A Lot 6 Click Score: 13   End of Session Equipment Utilized During Treatment: Gait belt;Rolling walker (2 wheels) Nurse Communication: Mobility status  Activity Tolerance: Patient tolerated treatment  well Patient left: in bed;with call bell/phone within reach;with bed alarm set  OT Visit Diagnosis: Unsteadiness on feet (R26.81);Other abnormalities of gait and mobility (R26.89);Muscle weakness (generalized) (M62.81)                Time: 1610-9604 OT Time Calculation (min): 16 min Charges:  OT General Charges $OT Visit: 1 Visit OT Evaluation $OT Eval Moderate Complexity: 1 Mod  06/05/2023  AB, OTR/L  Acute Rehabilitation Services  Office: [phone]   Tristan Schroeder 06/05/2023, 1:16 PM

## 2023-06-05 NOTE — Discharge Instructions (Signed)

## 2023-06-05 NOTE — Consult Note (Signed)
[facility] Renal Consultation Note  Requesting MD: Gloris Manchester, MD Indication for Consultation:  advanced CKD with metabolic acidosis   Chief complaint: fatigue and weakness  HPI:  [name] is a 89 y.o. male with a history of dementia, CKD, HTN, CAD, afib, and congestive heart failure who presented to the hospital with fatigue and generalized weakness.  He has followed with palliative care.  He was found to have worsening of his CKD and acidosis.  Further work-up also demonstrated concern for an atypical pneumonia.  Nephrology recommended conservative measures and initiation of bicarbonate tablets.  The ER provider spoke with his family and they elected to treat the treatable and admit him for observation.  He was hydrated.  He had a CT abdomen/pelvis which demonstrated similar appearance of chronic severe left hydronephrosis and moderate left hydroureter with severe left renal parenchyma atrophy; he also had an 18 mm nonobstructing stone in the upper pole of the left kidney.  His renal function has thankfully improved.  His main concern is being able to urinate easily and he has a condom catheter right now.  He states that he doesn't have much notice.  He states "all of this is just about over with, anyway", indicating that he means that he is relatively near death.  I spoke with his primary team.  He is to be discharged today with hospice care at home.  I spoke with his daughter in law.  He has caregivers that help him during the day and his grandson is there at night.  She mentions that he sees Dr. Wynelle Link at [facility] and she states that they have an appointment soon that she is still planning to take him to - I have let her know that their goals of care and symptom management would dictate whether to keep that appointment.     Creatinine, Ser  Date/Time Value Ref Range Status  06/05/2023 05:38 AM 3.85 (H) 0.61 - 1.24 mg/dL Final  23/76/2831 51:76 PM 4.50 (H) 0.61  - 1.24 mg/dL Final  16/01/3709 62:69 AM 4.21 (H) 0.61 - 1.24 mg/dL Final  48/54/6270 35:00 PM 3.40 (H) 0.61 - 1.24 mg/dL Final  93/81/8299 37:16 AM 2.95 (H) 0.61 - 1.24 mg/dL Final  96/78/9381 01:75 PM 3.66 (H) 0.76 - 1.27 mg/dL Final  05/02/8526 78:24 PM 3.20 (H) 0.61 - 1.24 mg/dL Final  23/53/6144 31:54 AM 2.85 (H) 0.61 - 1.24 mg/dL Final  00/86/7619 50:93 AM 3.01 (H) 0.61 - 1.24 mg/dL Final  26/71/2458 09:98 PM 3.39 (H) 0.61 - 1.24 mg/dL Final  33/82/5053 97:67 AM 3.00 (H) 0.61 - 1.24 mg/dL Final  34/19/3790 24:09 AM 3.17 (H) 0.61 - 1.24 mg/dL Final  73/53/2992 42:68 PM 3.58 (H) 0.61 - 1.24 mg/dL Final  34/19/6222 97:98 PM 3.46 (H) 0.61 - 1.24 mg/dL Final  92/05/9416 40:81 AM 2.58 (H) 0.61 - 1.24 mg/dL Final  44/81/8563 14:97 PM 2.72 (H) 0.61 - 1.24 mg/dL Final  02/63/7858 85:02 AM 3.14 (H) 0.61 - 1.24 mg/dL Final  77/41/2878 67:67 AM 3.22 (H) 0.61 - 1.24 mg/dL Final  20/94/7096 28:36 AM 3.43 (H) 0.61 - 1.24 mg/dL Final  62/94/7654 65:03 AM 3.44 (H) 0.61 - 1.24 mg/dL Final  54/65/6812 75:17 AM 3.39 (H) 0.61 - 1.24 mg/dL Final  00/17/4944 96:75 AM 3.45 (H) 0.61 - 1.24 mg/dL Final  91/63/8466 59:93 AM 3.69 (H) 0.61 - 1.24 mg/dL Final  57/07/7791 90:30 AM 3.75 (H) 0.61 - 1.24 mg/dL Final  03/31/3006 62:26 AM 3.26 (H)  0.61 - 1.24 mg/dL Final  16/04/9603 54:09 AM 2.83 (H) 0.61 - 1.24 mg/dL Final  81/19/1478 29:56 AM 2.69 (H) 0.61 - 1.24 mg/dL Final  21/30/8657 84:69 AM 2.85 (H) 0.61 - 1.24 mg/dL Final  62/95/2841 32:44 PM 3.13 (H) 0.61 - 1.24 mg/dL Final  07/11/7251 66:44 AM 3.39 (H) 0.61 - 1.24 mg/dL Final  03/47/4259 56:38 AM 2.79 (H) 0.61 - 1.24 mg/dL Final  75/64/3329 51:88 AM 3.02 (H) 0.61 - 1.24 mg/dL Final  41/66/0630 16:01 AM 3.29 (H) 0.61 - 1.24 mg/dL Final  09/32/3557 32:20 AM 3.15 (H) 0.61 - 1.24 mg/dL Final  25/42/7062 37:62 PM 3.89 (H) 0.61 - 1.24 mg/dL Final  83/15/1761 60:73 AM 2.79 (H) 0.61 - 1.24 mg/dL Final  71/12/2692 85:46 AM 3.21 (H) 0.61 - 1.24 mg/dL Final   27/09/5007 38:18 AM 3.41 (H) 0.61 - 1.24 mg/dL Final  29/93/7169 67:89 AM 3.00 (H) 0.61 - 1.24 mg/dL Final  38/04/1750 02:58 AM 3.10 (H) 0.61 - 1.24 mg/dL Final  52/77/8242 35:36 AM 3.02 (H) 0.61 - 1.24 mg/dL Final  14/43/1540 08:67 AM 3.90 (H) 0.61 - 1.24 mg/dL Final  61/95/0932 67:12 PM 4.35 (H) 0.61 - 1.24 mg/dL Final  45/80/9983 38:25 AM 4.71 (H) 0.61 - 1.24 mg/dL Final  05/39/7673 41:93 AM 4.68 (H) 0.61 - 1.24 mg/dL Final  79/08/4095 35:32 AM 5.31 (H) 0.61 - 1.24 mg/dL Final  99/24/2683 41:96 AM 2.19 (H) 0.61 - 1.24 mg/dL Final  22/29/7989 21:19 AM 2.01 (H) 0.61 - 1.24 mg/dL Final  41/74/0814 48:18 AM 2.16 (H) 0.61 - 1.24 mg/dL Final  56/31/4970 26:37 AM 1.94 (H) 0.61 - 1.24 mg/dL Final  85/88/5027 74:12 AM 2.04 (H) 0.61 - 1.24 mg/dL Final  87/86/7672 09:47 AM 2.09 (H) 0.4 - 1.5 mg/dL Final     PMHx:   Past Medical History:  Diagnosis Date   Anemia    Cardiac disease    CHF (congestive heart failure) (HCC)    Choledocholithiasis    Chronic kidney disease (CKD), stage IV (severe) (HCC)    left   Elevated LFTs    Esophageal stenosis    Hiatal hernia 04/03/2019   large   History of blindness    History of diarrhea    History of gallstones    Hypercholesteremia    Hypertension    Hypothyroidism    Malnutrition (HCC)    Osteoporosis    Prostate cancer (HCC)    Sepsis (HCC)    Shingles    Shingles    Skin cancer    right cheek    Past Surgical History:  Procedure Laterality Date   CARDIAC CATHETERIZATION  04/13/2014   CT PERC CHOLECYSTOSTOMY  07/20/2016   Placed at [facility] Interventional Radiology   ENDOSCOPIC RETROGRADE CHOLANGIOPANCREATOGRAPHY (ERCP) WITH PROPOFOL N/A 04/06/2019   Procedure: ENDOSCOPIC RETROGRADE CHOLANGIOPANCREATOGRAPHY (ERCP) WITH PROPOFOL;  Surgeon: Midge Minium, MD;  Location: [facility] ENDOSCOPY;  Service: Endoscopy;  Laterality: N/A;    Family Hx:  Family History  Problem Relation Age of Onset   CAD Mother    Cancer Mother    Heart attack  Father     Social History:  reports that he quit smoking about 62 years ago. His smoking use included cigarettes and cigars. He has been exposed to tobacco smoke. His smokeless tobacco use includes chew. He reports that he does not currently use alcohol. He reports that he does not use drugs.  Allergies:  Allergies  Allergen Reactions   Ativan [Lorazepam] Other (See Comments)  Hallucinations, combative   Penicillin G Other (See Comments)    Has patient had a PCN reaction causing immediate rash, facial/tongue/throat swelling, SOB or lightheadedness with hypotension: Yes Has patient had a PCN reaction causing severe rash involving mucus membranes or skin necrosis: No Has patient had a PCN reaction that required hospitalization No Has patient had a PCN reaction occurring within the last 10 years: No If all of the above answers are "NO", then may proceed with Cephalosporin use.    Amlodipine Swelling   Lisinopril Other (See Comments)    unknown   Spironolactone Other (See Comments)    unknown   Tessalon [Benzonatate]     unknown    Medications: Prior to Admission medications   Medication Sig Start Date End Date Taking? Authorizing Provider  amiodarone (PACERONE) 200 MG tablet Take 1 tablet (200 mg total) by mouth 2 (two) times daily. Patient taking differently: Take 200 mg by mouth daily. 11/22/22 11/22/23 Yes Laurier Nancy, MD  apixaban (ELIQUIS) 2.5 MG TABS tablet Take 1 tablet (2.5 mg total) by mouth 2 (two) times daily. 01/17/23 01/17/24 Yes Laurier Nancy, MD  atorvastatin (LIPITOR) 40 MG tablet Take 40 mg by mouth at bedtime.    Yes [provider]  Cholecalciferol (VITAMIN D3) 5000 units TABS Take 5,000 Units by mouth daily.   Yes [provider]  [employer] DIGESTIVE PROBIOTIC 250 MG capsule Take 250 mg by mouth 2 (two) times daily. 04/20/22  Yes [provider]  cyanocobalamin (,VITAMIN B-12,) 1000 MCG/ML injection Inject 1,000 mcg into the muscle every 30  (thirty) days. Every 15th of the month. 11/08/21  Yes [provider]  ferrous sulfate 325 (65 FE) MG tablet Take 325 mg by mouth 2 (two) times daily.    Yes [provider]  levothyroxine (SYNTHROID) 100 MCG tablet Take 100 mcg by mouth daily before breakfast. 05/18/22  Yes [provider]  mirabegron ER (MYRBETRIQ) 50 MG TB24 tablet Take 1 tablet (50 mg total) by mouth daily. 10/30/22  Yes Sondra Come, MD  nitroGLYCERIN (NITROSTAT) 0.4 MG SL tablet Place 1 tablet (0.4 mg total) under the tongue every 5 (five) minutes as needed for chest pain. 12/17/22  Yes Laurier Nancy, MD  Omega-3 Fatty Acids (FISH OIL) 1200 MG CAPS Take 1,200 mg by mouth 2 (two) times daily.   Yes [provider]  pantoprazole (PROTONIX) 40 MG tablet Take 40 mg by mouth daily.   Yes [provider]  tamsulosin (FLOMAX) 0.4 MG CAPS capsule Take 0.4 mg by mouth at bedtime.    Yes [provider]  doxycycline (VIBRA-TABS) 100 MG tablet Take 1 tablet (100 mg total) by mouth every 12 (twelve) hours for 5 days. 06/05/23 06/10/23  Leatha Gilding, MD  potassium chloride (KLOR-CON) 10 MEQ tablet Take 10 mEq by mouth daily. Patient not taking: Reported on 06/04/2023    [provider]  sodium bicarbonate 650 MG tablet Take 2 tablets (1,300 mg total) by mouth 3 (three) times daily. Patient not taking: Reported on 04/15/2023 06/29/22   Arnetha Courser, MD  torsemide (DEMADEX) 20 MG tablet Take 1 tablet (20 mg total) by mouth daily as needed (fluid, edema). 06/05/23   Leatha Gilding, MD    I have reviewed the patient's current medications.  Labs:     Latest Ref Rng & Units 06/05/2023    5:38 AM 06/04/2023   12:05 PM 06/04/2023   11:28 AM  BMP  Glucose 70 -  99 mg/dL 92  161  096   BUN 8 - 23 mg/dL 57  65  65   Creatinine 0.61 - 1.24 mg/dL 0.45  4.09  8.11   Sodium 135 - 145 mmol/L 135  135  133   Potassium 3.5 - 5.1 mmol/L 3.8  3.5  3.5   Chloride 98 - 111 mmol/L  113  111  110   CO2 22 - 32 mmol/L 14   13   Calcium 8.9 - 10.3 mg/dL 8.6   8.8     Urinalysis    Component Value Date/Time   COLORURINE YELLOW 06/04/2023 1117   APPEARANCEUR CLEAR 06/04/2023 1117   LABSPEC 1.012 06/04/2023 1117   PHURINE 5.0 06/04/2023 1117   GLUCOSEU NEGATIVE 06/04/2023 1117   HGBUR SMALL (A) 06/04/2023 1117   BILIRUBINUR NEGATIVE 06/04/2023 1117   BILIRUBINUR negative 03/03/2022 1539   KETONESUR NEGATIVE 06/04/2023 1117   PROTEINUR NEGATIVE 06/04/2023 1117   UROBILINOGEN 0.2 03/03/2022 1539   UROBILINOGEN 0.2 12/02/2010 0107   NITRITE NEGATIVE 06/04/2023 1117   LEUKOCYTESUR NEGATIVE 06/04/2023 1117     ROS:  Pertinent items noted in HPI and remainder of comprehensive ROS otherwise negative. Note limitation of dementia   Physical Exam: Vitals:   06/05/23 0449 06/05/23 1046  BP: (!) 114/54 (!) 110/57  Pulse: 62 64  Resp: 18   Temp: (!) 97.5 F (36.4 C) 98 F (36.7 C)  SpO2: 97% 96%     General: elderly male in bed   HEENT: NCAT; has dentures  Eyes: EOMI sclera anicteric Neck: supple trachea midline  Heart: S1S2 no rub Lungs: clear and unlabored; increased work of breathing with exertion   Abdomen: soft/nt/nd  Extremities: no edema appreciated; no cyanosis or clubbing Skin: no rash on extremities exposed Neuro: he is oriented to person and location of a "a hospital" and not to year  Psych no anxiety or agitation  GU condom catheter with about 300-400 mL urine  Assessment/Plan:  # AKI  - Recommended conservative measures.  He is not a candidate for dialysis. He improved with gentle fluids; these were stopped earlier - He is being discharged today with home hospice  - His family states that he has an appointment coming up with Dr. Wynelle Link that they are planning to go to.  I let them know that his comfort and symptom management would now guide whether to attend or cancel this visit and that in general if they are transitioning to hospice care  that other routine appointments would not be needed   # CKD stage IV - He follows with Dr. Wynelle Link  # Metabolic acidosis - Resume sodium bicarbonate   # Dementia  - Due to Alzheimer's - He is known to neurology and has been following with palliative care  - His family makes his medical decisions  Disposition per primary team.  He is discharging today with hospice care.    Estanislado Emms 06/05/2023, 1:55 PM

## 2023-06-05 NOTE — Progress Notes (Signed)
Echocardiogram 2D Echocardiogram has been performed.  [name] 06/05/2023, 11:55 AM

## 2023-06-05 NOTE — Discharge Summary (Signed)
Physician Discharge Summary  [name] UXL:[id number] DOB: December 13, 1926 DOA: 06/04/2023  PCP: Pcp, No  Admit date: 06/04/2023 Discharge date: 06/05/2023  Admitted From: home Disposition:  home with hospice  Recommendations for Outpatient Follow-up:  Follow up with PCP in 1-2 weeks Please obtain BMP/CBC in one week Please follow up on the following pending results:  Home Health: none Equipment/Devices: hospice  Discharge Condition: stable CODE STATUS: DNR Diet Orders (From admission, onward)     Start     Ordered   06/04/23 1745  Diet Heart Room service appropriate? Yes; Fluid consistency: Thin  Diet effective now       Question Answer Comment  Room service appropriate? Yes   Fluid consistency: Thin      06/04/23 1750            HPI: Per admitting MD, Patient is a 89 years old male with past medical history of prostate cancer, anemia, stage IV CKD, hypertension, protein calorie malnutrition, failure to thrive, CAD, CHF, atrial fibrillation and dementia who lives with his family at home presented to hospital with progressive generalized weakness for last 2 weeks with increasing fatigue since he got his flu shot 2 weeks back.  At baseline patient was able to ambulate with assistance and wheelchair but has been requiring more assistance recently, unable to ambulate and go to the bathroom on his own.  He does have macular degeneration limiting his mobility.  Patient lives with his wife and has been taken care of by caregivers at home.  Due to this progressive weakness patient was brought into the hospital.  Patient's daughter-in-law at bedside present reported no fever chills.  He does have a mild cough at baseline.  He normally had been having good appetite but recently for the 2 weeks has not been eating much.  Has been urinating okay. In the ED patient was noted to have stable vitals.  Patient was afebrile urinalysis was negative for infection.  TSH of 2.0.  BMP showed mild  hyponatremia with sodium of 133 with creatinine elevated at 4.2 with bicarb low at 13.  COVID influenza and RSV was negative.  CBC showed mild pancytopenia with a hemoglobin of 9.1 and platelet of 112.  Lactate was 1.2.  Nephrology was consulted from the ED who recommended resuming bicarb tablets.  Patient was then considered for admission to the hospital for further evaluation and treatment for progressive weakness possible PT evaluation and palliative care reevaluation.Marland Kitchen  Hospital Course / Discharge diagnoses: Principal Problem:   Generalized weakness Active Problems:   Chronic kidney disease, stage IV (severe) (HCC)   Chronic diastolic (congestive) heart failure (HCC)   Benign essential HTN   Malignant neoplasm of prostate (HCC)   Dementia due to Alzheimer's disease (HCC)   Principal problem Acute kidney injury on CKD stage IV-likely in the setting of poor p.o. intake at home as well as dated diuretics.  Creatinine improved to 3.9 with conservative management.  After discussing goals of care with family, he will return home with hospice.  Change torsemide from daily to daily PRN.  Continue bicarb  Active problems Generalized weakness, GOC-patient has been having a decline over the last several weeks in the setting of his advanced dementia.  Workup in the ER showed may be some evidence for atypical pneumonia but he is without a lot of symptoms.  Has been placed on doxycycline for a few days.  His decline may be expected in the setting of advanced age and dementia.  I have discussed with the patient's POA, they are in the process of setting up hospice at home and currently they do have 24-hour care.  He will be discharged home Possible atypical pneumonia-complete a course of doxycycline Pancytopenia-due to chronic disease History of dementia-supportive care History of A-fib-continue home medications Hypothyroidism-continue Synthroid, TSH unremarkable Prostate cancer - noted  Sepsis ruled  out   Discharge Instructions   Allergies as of 06/05/2023       Reactions   Ativan [lorazepam] Other (See Comments)   Hallucinations, combative   Penicillin G Other (See Comments)   Has patient had a PCN reaction causing immediate rash, facial/tongue/throat swelling, SOB or lightheadedness with hypotension: Yes Has patient had a PCN reaction causing severe rash involving mucus membranes or skin necrosis: No Has patient had a PCN reaction that required hospitalization No Has patient had a PCN reaction occurring within the last 10 years: No If all of the above answers are "NO", then may proceed with Cephalosporin use.   Amlodipine Swelling   Lisinopril Other (See Comments)   unknown   Spironolactone Other (See Comments)   unknown   Tessalon [benzonatate]    unknown        Medication List     TAKE these medications    amiodarone 200 MG tablet Commonly known as: Pacerone Take 1 tablet (200 mg total) by mouth 2 (two) times daily. What changed: when to take this   apixaban 2.5 MG Tabs tablet Commonly known as: Eliquis Take 1 tablet (2.5 mg total) by mouth 2 (two) times daily.   atorvastatin 40 MG tablet Commonly known as: LIPITOR Take 40 mg by mouth at bedtime.   [employer] Digestive Probiotic 250 MG capsule Generic drug: saccharomyces boulardii Take 250 mg by mouth 2 (two) times daily.   cyanocobalamin 1000 MCG/ML injection Commonly known as: VITAMIN B12 Inject 1,000 mcg into the muscle every 30 (thirty) days. Every 15th of the month.   doxycycline 100 MG tablet Commonly known as: VIBRA-TABS Take 1 tablet (100 mg total) by mouth every 12 (twelve) hours for 5 days.   ferrous sulfate 325 (65 FE) MG tablet Take 325 mg by mouth 2 (two) times daily.   Fish Oil 1200 MG Caps Take 1,200 mg by mouth 2 (two) times daily.   levothyroxine 100 MCG tablet Commonly known as: SYNTHROID Take 100 mcg by mouth daily before breakfast.   mirabegron ER 50 MG Tb24 tablet Commonly  known as: Myrbetriq Take 1 tablet (50 mg total) by mouth daily.   nitroGLYCERIN 0.4 MG SL tablet Commonly known as: NITROSTAT Place 1 tablet (0.4 mg total) under the tongue every 5 (five) minutes as needed for chest pain.   pantoprazole 40 MG tablet Commonly known as: PROTONIX Take 40 mg by mouth daily.   potassium chloride 10 MEQ tablet Commonly known as: KLOR-CON Take 10 mEq by mouth daily.   sodium bicarbonate 650 MG tablet Take 2 tablets (1,300 mg total) by mouth 3 (three) times daily.   tamsulosin 0.4 MG Caps capsule Commonly known as: FLOMAX Take 0.4 mg by mouth at bedtime.   torsemide 20 MG tablet Commonly known as: DEMADEX Take 1 tablet (20 mg total) by mouth daily as needed (fluid, edema). What changed:  when to take this reasons to take this   Vitamin D3 125 MCG (5000 UT) Tabs Take 5,000 Units by mouth daily.       Procedures/Studies:  CT CHEST ABDOMEN PELVIS WO CONTRAST  Result Date: 06/04/2023 CLINICAL DATA:  Unintentional weight loss. Generalized weakness. Recent flu shot and patient feels tired. EXAM: CT CHEST, ABDOMEN AND PELVIS WITHOUT CONTRAST TECHNIQUE: Multidetector CT imaging of the chest, abdomen and pelvis was performed following the standard protocol without IV contrast. RADIATION DOSE REDUCTION: This exam was performed according to the departmental dose-optimization program which includes automated exposure control, adjustment of the mA and/or kV according to patient size and/or use of iterative reconstruction technique. COMPARISON:  CT abdomen pelvis dated 01/25/2023. FINDINGS: Evaluation of this exam is limited in the absence of intravenous contrast. CT CHEST FINDINGS Cardiovascular: Mild cardiomegaly. No pericardial effusion. Three-vessel coronary vascular calcification. There is moderate atherosclerotic calcification of the thoracic aorta. No aneurysmal dilatation. The central pulmonary arteries are grossly unremarkable on this noncontrast CT.  Mediastinum/Nodes: No hilar or mediastinal adenopathy. There is a large hiatal hernia containing the majority of the stomach. The esophagus is grossly unremarkable. No mediastinal fluid collection. Lungs/Pleura: Trace bilateral pleural effusions, decreased since the prior CT. There is diffuse interstitial and interlobular septal prominence suggestive of edema. Atypical pneumonia is not excluded. Clinical correlation is recommended. Small left upper lobe calcified granuloma. No pneumothorax. The central airways are patent. Musculoskeletal: Osteopenia with degenerative changes of the spine. Old healed left rib fractures. Age indeterminate compression fracture of superior endplate of T1 with approximately 40% loss of vertebral body height anteriorly. Correlation with clinical exam and point tenderness recommended. There is a minimally displaced fracture of the posterior left twelfth rib, new since the prior CT. CT ABDOMEN PELVIS FINDINGS No intra-abdominal free air or free fluid. Hepatobiliary: The liver is unremarkable. No biliary dilatation. The gallbladder is unremarkable. Pancreas: Unremarkable. No pancreatic ductal dilatation or surrounding inflammatory changes. Spleen: Normal in size without focal abnormality. Adrenals/Urinary Tract: The adrenal glands unremarkable. Similar appearance of chronic severe left hydronephrosis and moderate left hydroureter. There is severe left renal parenchyma atrophy. An 18 mm nonobstructing stone in the upper pole of the left kidney in similar position. There is no hydronephrosis or nephrolithiasis on the right. The right ureter appears unremarkable. There is trabeculated appearance of the bladder wall likely related to chronic bladder outlet obstruction. Stomach/Bowel: There is sigmoid diverticulosis without active inflammatory changes. There is no bowel obstruction or active inflammation. The appendix is normal. Vascular/Lymphatic: Moderate aortoiliac atherosclerotic disease. The  IVC is unremarkable. No portal venous gas. There is no adenopathy. There is mild haziness of the mesentery with multiple top-normal lymph nodes with a "misty mesentery" appearance. This finding is nonspecific but may be related to underlying inflammatory/infectious etiology. Reproductive: The prostate and seminal vesicles are grossly unremarkable. No pelvic mass. Other: None Musculoskeletal: Osteopenia with degenerative changes of the spine. Severe arthritic changes of the left hip with bone-on-bone contact. Healing fracture of inferior right pubic ramus. No acute osseous pathology. IMPRESSION: 1. Mild cardiomegaly with trace bilateral pleural effusions and diffuse interstitial and interlobular septal prominence suggestive of edema. Atypical pneumonia is not excluded. 2. Age indeterminate compression fracture of superior endplate of T1. Correlation with clinical exam and point tenderness recommended. 3. Minimally displaced fracture of the posterior left twelfth rib, new since the prior CT. 4. Sigmoid diverticulosis. No bowel obstruction. Normal appendix. 5. Similar appearance of chronic severe left hydronephrosis and moderate left hydroureter with severe left renal parenchyma atrophy. 6. An 18 mm nonobstructing stone in the upper pole of the left kidney. 7.  Aortic Atherosclerosis (ICD10-I70.0). Electronically Signed   By: Elgie Collard M.D.   On: 06/04/2023 16:23   CT Head Wo Contrast  Result  Date: 06/04/2023 CLINICAL DATA:  Mental status change, unknown cause. Generalized weakness and fatigue. EXAM: CT HEAD WITHOUT CONTRAST TECHNIQUE: Contiguous axial images were obtained from the base of the skull through the vertex without intravenous contrast. RADIATION DOSE REDUCTION: This exam was performed according to the departmental dose-optimization program which includes automated exposure control, adjustment of the mA and/or kV according to patient size and/or use of iterative reconstruction technique.  COMPARISON:  Head CT 11/16/2022. FINDINGS: Brain: No acute hemorrhage. 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 Cortical gray-white differentiation is otherwise preserved. Prominence of the ventricles and sulci within expected range for age. No hydrocephalus or extra-axial collection. No mass effect or midline shift. Vascular: No hyperdense vessel or unexpected calcification. Skull: No calvarial fracture or suspicious bone lesion. Skull base is unremarkable. Sinuses/Orbits: No acute finding. Other: None. IMPRESSION: 1. No acute intracranial abnormality. 2. 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 Electronically Signed   By: Orvan Falconer M.D.   On: 06/04/2023 15:13   DG Chest Port 1 View  Result Date: 06/04/2023 CLINICAL DATA:  Questionable sepsis.  Evaluate for abnormality. EXAM: PORTABLE CHEST 1 VIEW COMPARISON:  Chest radiograph 11/16/2022 CT abdomen 01/25/2023 FINDINGS: There appears to be a large hiatal hernia. Hazy densities in the right lower lung may represent atelectasis but indeterminate. Coarse lung markings appear to be chronic. Heart size is grossly stable. IMPRESSION: 1. Hazy densities in the right lower lung. Findings could represent atelectasis but indeterminate. 2. Large hiatal hernia. Electronically Signed   By: Richarda Overlie M.D.   On: 06/04/2023 13:21     Subjective: No complaints   Discharge Exam: BP (!) 110/57 (BP Location: Right Arm)   Pulse 64   Temp 98 F (36.7 C) (Axillary)   Resp 18   Ht 5\' 5"  (1.651 m)   Wt 59.5 kg   SpO2 96%   BMI 21.83 kg/m   General: Pt is alert, awake, not in acute distress Cardiovascular: RRR, S1/S2 +, no rubs, no gallops Respiratory: CTA bilaterally, no wheezing, no rhonchi   The results of significant diagnostics from this hospitalization (including imaging, microbiology, ancillary and laboratory) are listed below  for reference.     Microbiology: Recent Results (from the past 240 hour(s))  Resp panel by RT-PCR (RSV, Flu A&B, Covid) Anterior Nasal Swab     Status: None   Collection Time: 06/04/23 11:17 AM   Specimen: Anterior Nasal Swab  Result Value Ref Range Status   SARS Coronavirus 2 by RT PCR NEGATIVE NEGATIVE Final   Influenza A by PCR NEGATIVE NEGATIVE Final   Influenza B by PCR NEGATIVE NEGATIVE Final    Comment: (NOTE) The Xpert Xpress SARS-CoV-2/FLU/RSV plus assay is intended as an aid in the diagnosis of influenza from Nasopharyngeal swab specimens and should not be used as a sole basis for treatment. Nasal washings and aspirates are unacceptable for Xpert Xpress SARS-CoV-2/FLU/RSV testing.  Fact Sheet for Patients: BloggerCourse.com  Fact Sheet for Healthcare Providers: SeriousBroker.it  This test is not yet approved or cleared by the [place] FDA and has been authorized for detection and/or diagnosis of SARS-CoV-2 by FDA under an Emergency Use Authorization (EUA). This EUA will remain in effect (meaning this test can be used) for the duration of the COVID-19 declaration under Section 564(b)(1) of the Act, 21 U.S.C. section 360bbb-3(b)(1), unless the authorization is terminated or revoked.     Resp Syncytial Virus by PCR NEGATIVE  NEGATIVE Final    Comment: (NOTE) Fact Sheet for Patients: BloggerCourse.com  Fact Sheet for Healthcare Providers: SeriousBroker.it  This test is not yet approved or cleared by the [place] FDA and has been authorized for detection and/or diagnosis of SARS-CoV-2 by FDA under an Emergency Use Authorization (EUA). This EUA will remain in effect (meaning this test can be used) for the duration of the COVID-19 declaration under Section 564(b)(1) of the Act, 21 U.S.C. section 360bbb-3(b)(1), unless the authorization is terminated  or revoked.  Performed at [facility], [address] [place], [place] [address]      Labs: Basic Metabolic Panel: Recent Labs  Lab 06/04/23 1128 06/04/23 1205 06/05/23 0538  NA 133* 135 135  K 3.5 3.5 3.8  CL 110 111 113*  CO2 13*  --  14*  GLUCOSE 110* 103* 92  BUN 65* 65* 57*  CREATININE 4.21* 4.50* 3.85*  CALCIUM 8.8*  --  8.6*  MG 1.7  --  1.8   Liver Function Tests: Recent Labs  Lab 06/04/23 1128 06/05/23 0538  AST 30 31  ALT 47* 41  ALKPHOS 60 55  BILITOT 0.8 0.7  PROT 5.7* 5.0*  ALBUMIN 2.6* 2.3*   CBC: Recent Labs  Lab 06/04/23 1128 06/04/23 1205 06/05/23 0538  WBC 3.8*  --  2.8*  NEUTROABS 2.9  --   --   HGB 9.1* 9.5* 8.9*  HCT 28.5* 28.0* 27.1*  MCV 93.8  --  92.2  PLT 112*  --  100*   CBG: No results for input(s): "GLUCAP" in the last 168 hours. Hgb A1c No results for input(s): "HGBA1C" in the last 72 hours. Lipid Profile No results for input(s): "CHOL", "HDL", "LDLCALC", "TRIG", "CHOLHDL", "LDLDIRECT" in the last 72 hours. Thyroid function studies Recent Labs    06/04/23 1128  TSH 2.034   Urinalysis    Component Value Date/Time   COLORURINE YELLOW 06/04/2023 1117   APPEARANCEUR CLEAR 06/04/2023 1117   LABSPEC 1.012 06/04/2023 1117   PHURINE 5.0 06/04/2023 1117   GLUCOSEU NEGATIVE 06/04/2023 1117   HGBUR SMALL (A) 06/04/2023 1117   BILIRUBINUR NEGATIVE 06/04/2023 1117   BILIRUBINUR negative 03/03/2022 1539   KETONESUR NEGATIVE 06/04/2023 1117   PROTEINUR NEGATIVE 06/04/2023 1117   UROBILINOGEN 0.2 03/03/2022 1539   UROBILINOGEN 0.2 12/02/2010 0107   NITRITE NEGATIVE 06/04/2023 1117   LEUKOCYTESUR NEGATIVE 06/04/2023 1117    FURTHER DISCHARGE INSTRUCTIONS:   Get Medicines reviewed and adjusted: Please take all your medications with you for your next visit with your Primary MD   Laboratory/radiological data: Please request your Primary MD to go over all hospital tests and procedure/radiological results at the  follow up, please ask your Primary MD to get all Hospital records sent to his/her office.   In some cases, they will be blood work, cultures and biopsy results pending at the time of your discharge. Please request that your primary care M.D. goes through all the records of your hospital data and follows up on these results.   Also Note the following: If you experience worsening of your admission symptoms, develop shortness of breath, life threatening emergency, suicidal or homicidal thoughts you must seek medical attention immediately by calling 911 or calling your MD immediately  if symptoms less severe.   You must read complete instructions/literature along with all the possible adverse reactions/side effects for all the Medicines you take and that have been prescribed to you. Take any new Medicines after you have completely understood  and accpet all the possible adverse reactions/side effects.    Do not drive when taking Pain medications or sleeping medications (Benzodaizepines)   Do not take more than prescribed Pain, Sleep and Anxiety Medications. It is not advisable to combine anxiety,sleep and pain medications without talking with your primary care practitioner   Special Instructions: If you have smoked or chewed Tobacco  in the last 2 yrs please stop smoking, stop any regular Alcohol  and or any Recreational drug use.   Wear Seat belts while driving.   Please note: You were cared for by a hospitalist during your hospital stay. Once you are discharged, your primary care physician will handle any further medical issues. Please note that NO REFILLS for any discharge medications will be authorized once you are discharged, as it is imperative that you return to your primary care physician (or establish a relationship with a primary care physician if you do not have one) for your post hospital discharge needs so that they can reassess your need for medications and monitor your lab values.  Time  coordinating discharge: 40 minutes  SIGNED:  Pamella Pert, MD, PhD 06/05/2023, 11:23 AM

## 2023-06-05 NOTE — Progress Notes (Signed)
DC paperwork and med list reviewed with pt and family, [name].  All belongings returned including dentures x2, glasses and clothing.  Pt to pick up new RX in TOC.  PT brought out via [facility] with NT and charge RN

## 2023-06-05 NOTE — TOC Transition Note (Addendum)
Transition of Care [facility]) - CM/SW Discharge Note   Patient Details  Name: [name] MRN: [id number] Date of Birth: August 02, 1926  [facility]) CM/SW Contact:  Tom-Johnson, Hershal Coria, RN Phone Number: 06/05/2023, 1:43 PM   Clinical Narrative:     Patient is scheduled for discharge today.  Readmission Risk Assessment done. Palliative services, will transition to Hospice care on Friday.  Hospice info, hospital f/u and discharge instructions on AVS. Patient currently active with [facility] for  Prescriptions sent to [facility] pharmacy and meds will be delivered to patient at bedside prior discharge. No PT/OT f/u noted. Celesta Gentile and Daughter in-law Clydie Braun to transport at discharge.  No further TOC needs noted.         Final next level of care: Home w Hospice Care Barriers to Discharge: Barriers Resolved   Patient Goals and CMS Choice CMS Medicare.gov Compare Post Acute Care list provided to:: Patient Choice offered to / list presented to : Patient, Adult Children Lucila Maine, Janyth Pupa and daughter in-law Clydie Braun.)  Discharge Placement                  Patient to be transferred to facility by: Lucila Maine and Daughter in-law Name of family member notified: Janyth Pupa and Clydie Braun    Discharge Plan and Services Additional resources added to the After Visit Summary for                  DME Arranged: N/A DME Agency: NA       HH Arranged: NA HH Agency: Other - See comment [facility]osp[facility]) Date HH Agency Contacted: 06/05/23 Time HH Agency Contacted: 1300 Representative spoke with at [facility] Agency: Melissa- Physicist, medical  Social Determinants of Health (SDOH) Interventions SDOH Screenings   Food Insecurity: No Food Insecurity (06/05/2023)  Housing: Low Risk  (06/05/2023)  Transportation Needs: No Transportation Needs (06/05/2023)  Utilities: Not At Risk (06/05/2023)  Tobacco Use: High Risk (06/04/2023)     Readmission Risk Interventions     No  data to display

## 2023-06-05 NOTE — Progress Notes (Signed)
   Referral received for [name] :goals of care discussion. Medical records including progress notes, labs, imaging. Updates received from MD.   Reached out to Thea Gist, RN regarding pending hospice enrollment. [facility] will discuss with family. Per MD, family would like for patient to return home today after working with PT. No other acute/inpatient palliative needs identified.   PMT remains available should needs arise. Will follow peripherally. Thank you for your referral and allowing PMT to assist in [facility]. [facility] care.   Richardson Dopp, [facility] Palliative Medicine Team  Team Phone # [phone]   NO CHARGE

## 2023-06-07 ENCOUNTER — Encounter: Payer: Self-pay | Admitting: Oncology

## 2023-06-10 ENCOUNTER — Telehealth: Payer: Self-pay | Admitting: *Deleted

## 2023-06-10 ENCOUNTER — Inpatient Hospital Stay: Payer: Medicare Other

## 2023-06-10 NOTE — Telephone Encounter (Signed)
Patients daughter called to report that he is entering Hospice Care and to please cancel all future appointments.

## 2023-06-12 ENCOUNTER — Inpatient Hospital Stay: Payer: Medicare Other

## 2023-07-08 ENCOUNTER — Inpatient Hospital Stay: Payer: Medicare Other

## 2023-08-01 ENCOUNTER — Telehealth: Payer: Self-pay | Admitting: Cardiovascular Disease

## 2023-08-01 NOTE — Telephone Encounter (Signed)
Pt daughter in law called to personally thank you for all the care that [name] has received by you over all the years. She wanted to let you know that unfortunately [name] passed yesterday 07/10/2023

## 2023-08-05 ENCOUNTER — Telehealth: Payer: Medicare Other | Admitting: Hospice and Palliative Medicine

## 2023-08-07 ENCOUNTER — Telehealth: Payer: Medicare Other | Admitting: Hospice and Palliative Medicine

## 2023-08-10 DEATH — deceased

## 2023-08-15 ENCOUNTER — Ambulatory Visit: Payer: Medicare Other | Admitting: Oncology

## 2023-08-15 ENCOUNTER — Ambulatory Visit: Payer: Medicare Other

## 2023-08-15 ENCOUNTER — Other Ambulatory Visit: Payer: Medicare Other

## 2023-09-16 ENCOUNTER — Ambulatory Visit: Payer: Medicare Other | Admitting: Cardiovascular Disease

## 2023-10-29 ENCOUNTER — Ambulatory Visit: Payer: Medicare Other | Admitting: Urology
